# Patient Record
Sex: Female | Born: 1950 | Race: White | Hispanic: No | Marital: Single | Smoking: Never smoker
Health system: Southern US, Community
[De-identification: ages and names within clinical notes are randomized; demographics above are authoritative.]

## PROBLEM LIST (undated history)

## (undated) DIAGNOSIS — G4730 Sleep apnea, unspecified: Secondary | ICD-10-CM

## (undated) DIAGNOSIS — E119 Type 2 diabetes mellitus without complications: Secondary | ICD-10-CM

## (undated) DIAGNOSIS — H409 Unspecified glaucoma: Secondary | ICD-10-CM

## (undated) DIAGNOSIS — H269 Unspecified cataract: Secondary | ICD-10-CM

## (undated) DIAGNOSIS — I2699 Other pulmonary embolism without acute cor pulmonale: Secondary | ICD-10-CM

## (undated) DIAGNOSIS — F32A Depression, unspecified: Secondary | ICD-10-CM

## (undated) DIAGNOSIS — R269 Unspecified abnormalities of gait and mobility: Secondary | ICD-10-CM

## (undated) DIAGNOSIS — I10 Essential (primary) hypertension: Secondary | ICD-10-CM

## (undated) DIAGNOSIS — G119 Hereditary ataxia, unspecified: Secondary | ICD-10-CM

## (undated) DIAGNOSIS — M4800 Spinal stenosis, site unspecified: Secondary | ICD-10-CM

## (undated) DIAGNOSIS — E785 Hyperlipidemia, unspecified: Secondary | ICD-10-CM

## (undated) DIAGNOSIS — G709 Myoneural disorder, unspecified: Secondary | ICD-10-CM

## (undated) DIAGNOSIS — K219 Gastro-esophageal reflux disease without esophagitis: Secondary | ICD-10-CM

## (undated) DIAGNOSIS — J45909 Unspecified asthma, uncomplicated: Secondary | ICD-10-CM

## (undated) DIAGNOSIS — M1990 Unspecified osteoarthritis, unspecified site: Secondary | ICD-10-CM

## (undated) HISTORY — DX: Gastro-esophageal reflux disease without esophagitis: K21.9

## (undated) HISTORY — DX: Unspecified cataract: H26.9

## (undated) HISTORY — DX: Hyperlipidemia, unspecified: E78.5

## (undated) HISTORY — DX: Spinal stenosis, site unspecified: M48.00

## (undated) HISTORY — PX: TONSILLECTOMY: SUR1361

## (undated) HISTORY — PX: NASAL SINUS SURGERY: SHX719

## (undated) HISTORY — DX: Sleep apnea, unspecified: G47.30

## (undated) HISTORY — PX: HAND NEUROPLASTY: SHX1729

## (undated) HISTORY — DX: Hereditary ataxia, unspecified: G11.9

## (undated) HISTORY — PX: ELBOW SURGERY: SHX618

## (undated) HISTORY — PX: ABDOMINAL HYSTERECTOMY: SUR658

## (undated) HISTORY — PX: BILATERAL CARPAL TUNNEL RELEASE: SHX6508

## (undated) HISTORY — DX: Unspecified asthma, uncomplicated: J45.909

## (undated) HISTORY — DX: Unspecified glaucoma: H40.9

## (undated) HISTORY — DX: Unspecified osteoarthritis, unspecified site: M19.90

## (undated) HISTORY — PX: ADENOIDECTOMY: SUR15

## (undated) HISTORY — PX: ANKLE SURGERY: SHX546

## (undated) HISTORY — DX: Other pulmonary embolism without acute cor pulmonale: I26.99

## (undated) HISTORY — PX: COLON SURGERY: SHX602

## (undated) HISTORY — PX: ABDOMINAL HYSTERECTOMY: SHX81

## (undated) HISTORY — DX: Myoneural disorder, unspecified: G70.9

## (undated) HISTORY — PX: SMALL INTESTINE SURGERY: SHX150

## (undated) HISTORY — PX: EYE SURGERY: SHX253

## (undated) HISTORY — DX: Depression, unspecified: F32.A

---
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
## 2009-06-02 DIAGNOSIS — B354 Tinea corporis: Secondary | ICD-10-CM | POA: Insufficient documentation

## 2009-09-25 DIAGNOSIS — D72819 Decreased white blood cell count, unspecified: Secondary | ICD-10-CM | POA: Insufficient documentation

## 2011-01-28 DIAGNOSIS — L570 Actinic keratosis: Secondary | ICD-10-CM | POA: Insufficient documentation

## 2011-05-24 DIAGNOSIS — M19049 Primary osteoarthritis, unspecified hand: Secondary | ICD-10-CM | POA: Insufficient documentation

## 2012-02-25 DIAGNOSIS — L309 Dermatitis, unspecified: Secondary | ICD-10-CM | POA: Insufficient documentation

## 2012-03-15 HISTORY — PX: GASTRIC BYPASS: SHX52

## 2012-03-26 DIAGNOSIS — IMO0002 Reserved for concepts with insufficient information to code with codable children: Secondary | ICD-10-CM | POA: Insufficient documentation

## 2012-04-26 DIAGNOSIS — M5136 Other intervertebral disc degeneration, lumbar region: Secondary | ICD-10-CM | POA: Insufficient documentation

## 2012-04-26 DIAGNOSIS — M51369 Other intervertebral disc degeneration, lumbar region without mention of lumbar back pain or lower extremity pain: Secondary | ICD-10-CM | POA: Insufficient documentation

## 2012-04-26 DIAGNOSIS — M25579 Pain in unspecified ankle and joints of unspecified foot: Secondary | ICD-10-CM | POA: Insufficient documentation

## 2012-04-28 DIAGNOSIS — M48061 Spinal stenosis, lumbar region without neurogenic claudication: Secondary | ICD-10-CM | POA: Insufficient documentation

## 2012-05-11 DIAGNOSIS — F4323 Adjustment disorder with mixed anxiety and depressed mood: Secondary | ICD-10-CM | POA: Insufficient documentation

## 2012-05-16 DIAGNOSIS — M47812 Spondylosis without myelopathy or radiculopathy, cervical region: Secondary | ICD-10-CM | POA: Insufficient documentation

## 2012-10-30 DIAGNOSIS — L439 Lichen planus, unspecified: Secondary | ICD-10-CM | POA: Insufficient documentation

## 2012-10-30 DIAGNOSIS — L820 Inflamed seborrheic keratosis: Secondary | ICD-10-CM | POA: Insufficient documentation

## 2012-11-30 DIAGNOSIS — Z789 Other specified health status: Secondary | ICD-10-CM | POA: Insufficient documentation

## 2012-11-30 DIAGNOSIS — R69 Illness, unspecified: Secondary | ICD-10-CM | POA: Insufficient documentation

## 2013-01-09 DIAGNOSIS — G5600 Carpal tunnel syndrome, unspecified upper limb: Secondary | ICD-10-CM | POA: Insufficient documentation

## 2013-03-26 DIAGNOSIS — M797 Fibromyalgia: Secondary | ICD-10-CM | POA: Insufficient documentation

## 2013-03-26 DIAGNOSIS — J4530 Mild persistent asthma, uncomplicated: Secondary | ICD-10-CM | POA: Insufficient documentation

## 2013-04-11 DIAGNOSIS — M5459 Other low back pain: Secondary | ICD-10-CM | POA: Insufficient documentation

## 2013-12-17 DIAGNOSIS — M47816 Spondylosis without myelopathy or radiculopathy, lumbar region: Secondary | ICD-10-CM | POA: Insufficient documentation

## 2014-02-19 DIAGNOSIS — E785 Hyperlipidemia, unspecified: Secondary | ICD-10-CM | POA: Insufficient documentation

## 2014-04-01 DIAGNOSIS — G2582 Stiff-man syndrome: Secondary | ICD-10-CM | POA: Insufficient documentation

## 2014-04-01 DIAGNOSIS — Z8679 Personal history of other diseases of the circulatory system: Secondary | ICD-10-CM | POA: Insufficient documentation

## 2014-04-01 HISTORY — DX: Stiff-man syndrome: G25.82

## 2014-05-24 DIAGNOSIS — Z90710 Acquired absence of both cervix and uterus: Secondary | ICD-10-CM | POA: Insufficient documentation

## 2014-05-24 HISTORY — DX: Acquired absence of both cervix and uterus: Z90.710

## 2014-05-28 DIAGNOSIS — F980 Enuresis not due to a substance or known physiological condition: Secondary | ICD-10-CM | POA: Insufficient documentation

## 2014-05-28 DIAGNOSIS — N3941 Urge incontinence: Secondary | ICD-10-CM | POA: Insufficient documentation

## 2014-10-01 DIAGNOSIS — R799 Abnormal finding of blood chemistry, unspecified: Secondary | ICD-10-CM | POA: Insufficient documentation

## 2014-10-01 DIAGNOSIS — G4733 Obstructive sleep apnea (adult) (pediatric): Secondary | ICD-10-CM

## 2014-10-01 DIAGNOSIS — E349 Endocrine disorder, unspecified: Secondary | ICD-10-CM | POA: Insufficient documentation

## 2014-10-01 DIAGNOSIS — E109 Type 1 diabetes mellitus without complications: Secondary | ICD-10-CM | POA: Insufficient documentation

## 2014-10-01 HISTORY — DX: Obstructive sleep apnea (adult) (pediatric): G47.33

## 2014-10-29 DIAGNOSIS — E213 Hyperparathyroidism, unspecified: Secondary | ICD-10-CM | POA: Insufficient documentation

## 2014-12-23 DIAGNOSIS — Z9889 Other specified postprocedural states: Secondary | ICD-10-CM | POA: Insufficient documentation

## 2015-06-16 DIAGNOSIS — F32A Depression, unspecified: Secondary | ICD-10-CM | POA: Insufficient documentation

## 2016-03-18 DIAGNOSIS — G2582 Stiff-man syndrome: Secondary | ICD-10-CM | POA: Diagnosis not present

## 2016-03-24 DIAGNOSIS — G4733 Obstructive sleep apnea (adult) (pediatric): Secondary | ICD-10-CM | POA: Diagnosis not present

## 2016-03-24 DIAGNOSIS — G2582 Stiff-man syndrome: Secondary | ICD-10-CM | POA: Diagnosis not present

## 2016-04-01 DIAGNOSIS — G2582 Stiff-man syndrome: Secondary | ICD-10-CM | POA: Diagnosis not present

## 2016-04-06 DIAGNOSIS — G2582 Stiff-man syndrome: Secondary | ICD-10-CM | POA: Diagnosis not present

## 2016-04-08 DIAGNOSIS — G2582 Stiff-man syndrome: Secondary | ICD-10-CM | POA: Diagnosis not present

## 2016-04-13 DIAGNOSIS — G2582 Stiff-man syndrome: Secondary | ICD-10-CM | POA: Diagnosis not present

## 2016-04-15 DIAGNOSIS — L821 Other seborrheic keratosis: Secondary | ICD-10-CM | POA: Diagnosis not present

## 2016-04-15 DIAGNOSIS — G2582 Stiff-man syndrome: Secondary | ICD-10-CM | POA: Diagnosis not present

## 2016-04-17 DIAGNOSIS — T23121A Burn of first degree of single right finger (nail) except thumb, initial encounter: Secondary | ICD-10-CM | POA: Diagnosis not present

## 2016-04-20 DIAGNOSIS — G2582 Stiff-man syndrome: Secondary | ICD-10-CM | POA: Diagnosis not present

## 2016-04-29 DIAGNOSIS — L821 Other seborrheic keratosis: Secondary | ICD-10-CM | POA: Diagnosis not present

## 2016-04-29 DIAGNOSIS — G2582 Stiff-man syndrome: Secondary | ICD-10-CM | POA: Diagnosis not present

## 2016-05-04 DIAGNOSIS — Z1231 Encounter for screening mammogram for malignant neoplasm of breast: Secondary | ICD-10-CM | POA: Diagnosis not present

## 2016-05-11 ENCOUNTER — Ambulatory Visit: Admitting: Podiatrist

## 2016-05-11 DIAGNOSIS — M79671 Pain in right foot: Secondary | ICD-10-CM | POA: Diagnosis not present

## 2016-05-11 DIAGNOSIS — E1151 Type 2 diabetes mellitus with diabetic peripheral angiopathy without gangrene: Secondary | ICD-10-CM | POA: Diagnosis not present

## 2016-05-11 DIAGNOSIS — B351 Tinea unguium: Secondary | ICD-10-CM | POA: Diagnosis not present

## 2016-05-11 DIAGNOSIS — M79672 Pain in left foot: Secondary | ICD-10-CM | POA: Diagnosis not present

## 2016-05-13 DIAGNOSIS — L821 Other seborrheic keratosis: Secondary | ICD-10-CM | POA: Diagnosis not present

## 2016-05-13 DIAGNOSIS — E1065 Type 1 diabetes mellitus with hyperglycemia: Secondary | ICD-10-CM | POA: Diagnosis not present

## 2016-05-13 DIAGNOSIS — G2582 Stiff-man syndrome: Secondary | ICD-10-CM | POA: Diagnosis not present

## 2016-05-13 NOTE — Progress Notes (Signed)
Patient:   Paula Mcmillan            MRN: [id number]            FIN: [phone] E1                 Age:   66 years     Sex:  Female     DOB:  [birth year]     Associated Diagnoses:   Arteriosclerosis of the Extremities; NIDDM with Peripheral Circulatory Disorder; Insulin long-term use; Foot pain right; Foot pain left; Onychomycosis due to dermatophyte     Author:   Kathalene Frames          Visit Information          Date of Service: 05/11/2016 08:45 am  Performing Location: [facility]  Encounter#: [phone] E1     Visit type:  Diabetic foot care Established patient.      Accompanied by:  No one.      Source of history:  Self.      Referral source:  Self.      History limitation:  None.           Chief Complaint     Insulin-dependent diabetic with onychomycosis 1-10. 3-4 mm hypertrophy. Yellow/Brown pigmentation. Ingrowing and painful. Limit ambulation.The left hallux is developing a hyperkeratosis along the lateral groove due to insult from the nail plate.    Nucleated hyperkeratosis of the right heel.    Posterior hyperkeratosis of the left heel.     05/11/2016 9:39 AM EST    dfc            History of Present Illness          The patient presents with onychomycosis and     Nucleated hyperkeratoses of the plantar surface of the right heel and hyperkeratoses of the posterior surface of the left heel..       Stiff person syndrome.    Labile insulin-dependent diabetes.          Review of Systems     Constitutional:  Negative.      Eye:  Negative.      Ear/Nose/Mouth/Throat:  Negative.      Respiratory:  Negative.      Cardiovascular:  Negative.      Gastrointestinal:  Negative.      Genitourinary:  Negative.      Hematology/Lymphatics:  Negative.      Endocrine:  Negative.      Immunologic:  Negative.      Musculoskeletal:  Negative.      Integumentary:  Negative.      Neurologic:  Negative.      Psychiatric:  Negative.      All otther ROS were reviewed and are negative          Health  Status     Allergies:      Allergic Reactions (Selected)    Severity Not Documented    NSAIDs (No reactions were documented)    Nonallergic Reactions (Selected)    Severity Not Documented    Aspirin (Meningitis)     Medications:  (Selected)     Documented Medications    Documented    CellCept: ( 500 mg ) bid mL 0 Refill(s) Type: Maintenance    Flovent HFA 220 mcg/inh inhalation aerosol: 2 puff(s) inh bid 0 Refill(s) Type: Maintenance    Lantus: ( 36 unit(s) ) subcutaneous hs mL 0 Refill(s) Type: Maintenance    NovoLog: ( 14  unit(s) ) subcutaneous tidac mL 0 Refill(s) Type: Maintenance    Privigen: ( 300 mg/kg ) iv q2 wks 0 Refill(s) Type: Maintenance    ProAir HFA: inh qid 0 Refill(s) Type: Maintenance    Tylenol Extra Strength: ( 1000 mg ) po q6 hrs 0 Refill(s) Type: Maintenance    Valium: ( 5 mg ) bid tab(s) 0 Refill(s) Type: Maintenance    Vitamin D: po mL 0 Refill(s) Type: Maintenance    atorvastatin 40 mg oral tablet: 0.5 tab(s) ( 20 mg ) po hs 0 Refill(s) Type: Maintenance    clobetasol 0.05% topical gel: 1 app top bid 0 Refill(s) Type: Maintenance    oxybutynin 5 mg oral tablet: 1 tab(s) ( 5 mg ) PO bid PRN: for urinary discomfort # 30 tab(s) 0 Refill(s) Type: Maintenance    tizanidine 2 mg oral tablet: 2 tab(s) ( 4 mg ) po q8 hrs 0 Refill(s) Type: Maintenance     Problem list:      All Problems    Asthma / ICD-9-CM 493.90 / Confirmed    Back pain low back / ICD-9-CM 724.2 / Confirmed    Carpal Tunnel Syndrome / ICD-9-CM 354.0 / Confirmed    Diabetes / ICD-9-CM 250.00 / Confirmed    GERD (Gastroesophageal Reflux Disease) / ICD-9-CM 530.81 / Confirmed    High Cholesterol / ICD-9-CM 272.0 / Confirmed    Hypoglycemia / SNOMED CT [id number] / Confirmed    Neck pain / ICD-9-CM 723.1 / Confirmed    Obesity / ICD-9-CM 278.00 / Probable    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    HEADACHE (784.0):  Resolved.    Pulmonary embolism (99371696):  Resolved.    Comments:    10/24/2013 EDT 10:26 AM EDT - Barrie Folk    left     Family History:      Alzheimer's Disease    Father (Deceased)    Aneurysm    Grandmother (M) (Deceased)    Grandmother (P) (Deceased)    Grandfather (P) (Deceased)    Grandfather (M) (Deceased)    Alcohol user    Father (Deceased)    Cancer - unknown origin    Mother (Deceased)    Tobacco user    Father (Deceased)         Procedure history:      Tonsil and adenoid structure (789381017).    Gastric bypass operation (51025852).    Colonoscopy (778242353).    Hysterectomy (614431540).    Carpal tunnel release (086761950).  Ethmoid sinus (57846962).    Arthroscopy of ankle (952841324).    Central line insertion (401027253).     Social History:          Alcohol Assessment: Denies Alcohol Use              Never        Tobacco Assessment: Denies Tobacco Use              Never        Substance Abuse Assessment: Denies Substance Abuse              Dermatologic Exam     Bilateral lower extremeties: warm normal elasticity normal turgor no lesions normal hair growth xerosis.  Findings specific to bilateral toenails: hallux second toes third toes fourth toes fifth toes yellow thickened brittle painful with palpation  hypertrophic subungual debris.  Findings specific to the right heel: plantar aspect This Nucleated hyperkeratoses..  Findings specific to the left heel: Posterior hyperkeratoses..  Findings specific to the left toes: medial aspect hallux hyperkeratotic tissue.       Vascular Exam     Arterial pulses are absent for the left dorsalis pedis.  Arterial pulses are absent for the right dorsalis pedis.  Arterial pulses are absent for the left posterior tibial.  Arterial pulses are absent for the right posterior tibial.  Capillary refill is within normal limits.  Edema is not present.       Neurologic Exam     Both lower extremities have normal sensation.  vibration preception     intact bilaterally     Bilateral lower extremity reflexes: ankle absent and negative Babinski.  Nondemonstrable     Monofilament Exam:  intact bilaterally.      Additional information:  Joint position sense intact bilaterally. Bilateral spasm.      SIX MONTH CLASS FINDINGS UPDATE        Skin:         Texture   _  WNL        Color  _   WNL       Temp.    _    WNL              Tugor    _   WNL          Varicosities   _    none        Edema   _     none        Dermatoses  _    xerosis        Hair  _    WNL     Nails  _      1-10    Mycoses   _    yellow/Brown pigmentation      Thickness  _      3-4 mm hypertrophy        Lesions: (Description) _   Hyperkratosis                  Location: _  Plantar surface of the right heel and posterior surface of the left heel.                  Number: _ 2        CIRCULATION:        D.P. _  Fermoral _               Popliteal _                                                     P.T. _                 Left  _     absent         Right   _    absent_      Left _      Right _        Left _     Right _       Left   _    absent     Right   _     absent        NEUROLOGIC:        Cutanaous _        Joint Position _                  Babinski _         Left   _    WNL      Right   _    WNL          Left   _    WNL     Right    _     WNL                Left   _    negative        Right   _    negative        Ankle _              Vibratory _                        Monofliament _        Left    _   absent           Right  _     absent            Left   _       intact          Right  _      intact              Left  _    WNL          Right    _    WNL            MUSCLE:        Weakness.    _    5/5              Spasm.  _      bilateral                     Clonis.  _      none         PCP visit: 03/17/2010 08/18/2010 03/22/2011 08/2011 09/23/2011 12/2011 09/30/2012 10/10/2012 03/29/2013 11/23/2013 05/2014 05/24/2014 12/29/2014 12/2015        Bernadette Hoit M.D.        PQRS Data Collection Neuropathy  Diabetic Foot and Ankle Care Peripheral Neuropathy  Neurological Evaluation            Lower Extremity Neurological Exam        Motor     Sensory:  reflexes absent_ vibratory_WNL  proprioception WNL_ sharp/dullWNL _ monofilament detection_WNL        Performed  Yes _x No _    Not Performed  Yes _ No _       Reason Not Performed:                Musculoskeletal Exam     Bilaterally the lower extremities have normal strength and 5/5 no clonis bilateral spasm..  Bilaterally Bunion Bunionette.  Gait Assessment::  Apropulsive.           Impression and Plan     Diagnosis       Arteriosclerosis of the Extremities (ICD10-CM I70.209).       NIDDM with Peripheral Circulatory Disorder (ICD10-CM E11.51).       Insulin long-term use (ICD10-CM Z79.4).       Foot pain right (ICD10-CM M79.671).       Foot pain left (ICD10-CM M79.672).       Onychomycosis due to dermatophyte (ICD10-CM B35.1).       Course:  Well controlled.      Plan:  [place] will be followed every three months..           Procedure: Manual debridement Rotobur reduction.      Follow-up:  In  3  months.      Orders       Orders     In Office Patient Care:    PQRI #126 Diabetes Mellitus: Diabetic Fo (Order)    Requests (Return to Office):    Return to Clinic (Request)  (Order): Return in 3 months    Charges:    [phone] low extemity neur exam docum [employer]) (Order): Quantity: 1 NIDDM with Peripheral Circulatory Disorder    11720 debridement of nail(s) by any method(s); 1 to 5 [facility]) (Order): Quantity: 1 Onychomycosis due to dermatophyte  Foot pain right  Foot pain left  NIDDM with Peripheral Circulatory Disorder.           Signed and Authored by Elvina Mattes on 05/11/2016 09:59 AM EST

## 2016-05-13 NOTE — Initial Assessments (Signed)
Quick Intake Entered On:  08/19/2016 10:36 AM EDT      Performed On:  08/19/2016 10:36 AM EDT by Theodoro Kalata                         Summary     Chief Complaint :   dfc     Race :   Caucasian     Languages :   English     Ethnicity :   Not Hispanic or Latino     [place]  Nadene - 08/19/2016 10:36 AM EDT

## 2016-05-26 DIAGNOSIS — E1065 Type 1 diabetes mellitus with hyperglycemia: Secondary | ICD-10-CM | POA: Diagnosis not present

## 2016-05-26 DIAGNOSIS — E785 Hyperlipidemia, unspecified: Secondary | ICD-10-CM | POA: Diagnosis not present

## 2016-05-27 DIAGNOSIS — G2582 Stiff-man syndrome: Secondary | ICD-10-CM | POA: Diagnosis not present

## 2016-06-10 DIAGNOSIS — G2582 Stiff-man syndrome: Secondary | ICD-10-CM | POA: Diagnosis not present

## 2016-06-14 DIAGNOSIS — I10 Essential (primary) hypertension: Secondary | ICD-10-CM | POA: Diagnosis not present

## 2016-06-14 DIAGNOSIS — Z79899 Other long term (current) drug therapy: Secondary | ICD-10-CM | POA: Diagnosis not present

## 2016-06-14 DIAGNOSIS — R42 Dizziness and giddiness: Secondary | ICD-10-CM | POA: Diagnosis not present

## 2016-06-14 DIAGNOSIS — E10649 Type 1 diabetes mellitus with hypoglycemia without coma: Secondary | ICD-10-CM | POA: Diagnosis not present

## 2016-06-14 DIAGNOSIS — E162 Hypoglycemia, unspecified: Secondary | ICD-10-CM | POA: Diagnosis not present

## 2016-06-14 DIAGNOSIS — Z794 Long term (current) use of insulin: Secondary | ICD-10-CM | POA: Diagnosis not present

## 2016-06-16 DIAGNOSIS — E10649 Type 1 diabetes mellitus with hypoglycemia without coma: Secondary | ICD-10-CM | POA: Diagnosis not present

## 2016-06-16 DIAGNOSIS — E162 Hypoglycemia, unspecified: Secondary | ICD-10-CM | POA: Diagnosis not present

## 2016-06-19 DIAGNOSIS — M4800 Spinal stenosis, site unspecified: Secondary | ICD-10-CM | POA: Diagnosis not present

## 2016-06-19 DIAGNOSIS — F339 Major depressive disorder, recurrent, unspecified: Secondary | ICD-10-CM | POA: Diagnosis not present

## 2016-06-19 DIAGNOSIS — F4323 Adjustment disorder with mixed anxiety and depressed mood: Secondary | ICD-10-CM | POA: Diagnosis not present

## 2016-06-19 DIAGNOSIS — Z86711 Personal history of pulmonary embolism: Secondary | ICD-10-CM | POA: Diagnosis not present

## 2016-06-19 DIAGNOSIS — G4733 Obstructive sleep apnea (adult) (pediatric): Secondary | ICD-10-CM | POA: Diagnosis not present

## 2016-06-19 DIAGNOSIS — Z8049 Family history of malignant neoplasm of other genital organs: Secondary | ICD-10-CM | POA: Diagnosis not present

## 2016-06-19 DIAGNOSIS — G4730 Sleep apnea, unspecified: Secondary | ICD-10-CM | POA: Diagnosis present

## 2016-06-19 DIAGNOSIS — F329 Major depressive disorder, single episode, unspecified: Secondary | ICD-10-CM | POA: Diagnosis present

## 2016-06-19 DIAGNOSIS — E213 Hyperparathyroidism, unspecified: Secondary | ICD-10-CM | POA: Diagnosis not present

## 2016-06-19 DIAGNOSIS — N3941 Urge incontinence: Secondary | ICD-10-CM | POA: Diagnosis not present

## 2016-06-19 DIAGNOSIS — Z9989 Dependence on other enabling machines and devices: Secondary | ICD-10-CM | POA: Diagnosis not present

## 2016-06-19 DIAGNOSIS — R9431 Abnormal electrocardiogram [ECG] [EKG]: Secondary | ICD-10-CM | POA: Diagnosis not present

## 2016-06-19 DIAGNOSIS — R4182 Altered mental status, unspecified: Secondary | ICD-10-CM | POA: Diagnosis not present

## 2016-06-19 DIAGNOSIS — M5136 Other intervertebral disc degeneration, lumbar region: Secondary | ICD-10-CM | POA: Diagnosis not present

## 2016-06-19 DIAGNOSIS — F419 Anxiety disorder, unspecified: Secondary | ICD-10-CM | POA: Diagnosis not present

## 2016-06-19 DIAGNOSIS — Z8249 Family history of ischemic heart disease and other diseases of the circulatory system: Secondary | ICD-10-CM | POA: Diagnosis not present

## 2016-06-19 DIAGNOSIS — Z794 Long term (current) use of insulin: Secondary | ICD-10-CM | POA: Diagnosis not present

## 2016-06-19 DIAGNOSIS — R260 Ataxic gait: Secondary | ICD-10-CM | POA: Diagnosis not present

## 2016-06-19 DIAGNOSIS — G119 Hereditary ataxia, unspecified: Secondary | ICD-10-CM | POA: Diagnosis not present

## 2016-06-19 DIAGNOSIS — E784 Other hyperlipidemia: Secondary | ICD-10-CM | POA: Diagnosis not present

## 2016-06-19 DIAGNOSIS — J45909 Unspecified asthma, uncomplicated: Secondary | ICD-10-CM | POA: Diagnosis present

## 2016-06-19 DIAGNOSIS — M6281 Muscle weakness (generalized): Secondary | ICD-10-CM | POA: Diagnosis not present

## 2016-06-19 DIAGNOSIS — G2582 Stiff-man syndrome: Secondary | ICD-10-CM | POA: Diagnosis present

## 2016-06-19 DIAGNOSIS — R55 Syncope and collapse: Secondary | ICD-10-CM | POA: Diagnosis not present

## 2016-06-19 DIAGNOSIS — E10649 Type 1 diabetes mellitus with hypoglycemia without coma: Secondary | ICD-10-CM | POA: Diagnosis present

## 2016-06-19 DIAGNOSIS — R410 Disorientation, unspecified: Secondary | ICD-10-CM | POA: Diagnosis not present

## 2016-06-19 DIAGNOSIS — Z9181 History of falling: Secondary | ICD-10-CM | POA: Diagnosis not present

## 2016-06-19 DIAGNOSIS — Z823 Family history of stroke: Secondary | ICD-10-CM | POA: Diagnosis not present

## 2016-06-19 DIAGNOSIS — R001 Bradycardia, unspecified: Secondary | ICD-10-CM | POA: Diagnosis present

## 2016-06-19 DIAGNOSIS — N3281 Overactive bladder: Secondary | ICD-10-CM | POA: Diagnosis present

## 2016-06-19 DIAGNOSIS — E210 Primary hyperparathyroidism: Secondary | ICD-10-CM | POA: Diagnosis present

## 2016-06-19 DIAGNOSIS — E109 Type 1 diabetes mellitus without complications: Secondary | ICD-10-CM | POA: Diagnosis not present

## 2016-06-19 DIAGNOSIS — Z83511 Family history of glaucoma: Secondary | ICD-10-CM | POA: Diagnosis not present

## 2016-06-19 DIAGNOSIS — E7800 Pure hypercholesterolemia, unspecified: Secondary | ICD-10-CM | POA: Diagnosis present

## 2016-06-19 DIAGNOSIS — Z79899 Other long term (current) drug therapy: Secondary | ICD-10-CM | POA: Diagnosis not present

## 2016-06-19 DIAGNOSIS — Z833 Family history of diabetes mellitus: Secondary | ICD-10-CM | POA: Diagnosis not present

## 2016-06-20 DIAGNOSIS — E10649 Type 1 diabetes mellitus with hypoglycemia without coma: Secondary | ICD-10-CM | POA: Insufficient documentation

## 2016-06-22 DIAGNOSIS — G4733 Obstructive sleep apnea (adult) (pediatric): Secondary | ICD-10-CM | POA: Diagnosis not present

## 2016-06-22 DIAGNOSIS — E119 Type 2 diabetes mellitus without complications: Secondary | ICD-10-CM | POA: Diagnosis not present

## 2016-06-22 DIAGNOSIS — M5136 Other intervertebral disc degeneration, lumbar region: Secondary | ICD-10-CM | POA: Diagnosis not present

## 2016-06-22 DIAGNOSIS — M6281 Muscle weakness (generalized): Secondary | ICD-10-CM | POA: Diagnosis not present

## 2016-06-22 DIAGNOSIS — E109 Type 1 diabetes mellitus without complications: Secondary | ICD-10-CM | POA: Diagnosis not present

## 2016-06-22 DIAGNOSIS — Z794 Long term (current) use of insulin: Secondary | ICD-10-CM | POA: Diagnosis not present

## 2016-06-22 DIAGNOSIS — R55 Syncope and collapse: Secondary | ICD-10-CM | POA: Diagnosis not present

## 2016-06-22 DIAGNOSIS — Z9181 History of falling: Secondary | ICD-10-CM | POA: Diagnosis not present

## 2016-06-22 DIAGNOSIS — E784 Other hyperlipidemia: Secondary | ICD-10-CM | POA: Diagnosis not present

## 2016-06-22 DIAGNOSIS — G119 Hereditary ataxia, unspecified: Secondary | ICD-10-CM | POA: Diagnosis not present

## 2016-06-22 DIAGNOSIS — F329 Major depressive disorder, single episode, unspecified: Secondary | ICD-10-CM | POA: Diagnosis not present

## 2016-06-22 DIAGNOSIS — J45909 Unspecified asthma, uncomplicated: Secondary | ICD-10-CM | POA: Diagnosis not present

## 2016-06-22 DIAGNOSIS — M4800 Spinal stenosis, site unspecified: Secondary | ICD-10-CM | POA: Diagnosis not present

## 2016-06-22 DIAGNOSIS — G2582 Stiff-man syndrome: Secondary | ICD-10-CM | POA: Diagnosis not present

## 2016-06-22 DIAGNOSIS — N3941 Urge incontinence: Secondary | ICD-10-CM | POA: Diagnosis not present

## 2016-06-22 DIAGNOSIS — N3281 Overactive bladder: Secondary | ICD-10-CM | POA: Diagnosis not present

## 2016-06-22 DIAGNOSIS — F339 Major depressive disorder, recurrent, unspecified: Secondary | ICD-10-CM | POA: Diagnosis not present

## 2016-06-22 DIAGNOSIS — E7800 Pure hypercholesterolemia, unspecified: Secondary | ICD-10-CM | POA: Diagnosis not present

## 2016-06-22 DIAGNOSIS — E10649 Type 1 diabetes mellitus with hypoglycemia without coma: Secondary | ICD-10-CM | POA: Diagnosis not present

## 2016-06-22 DIAGNOSIS — R260 Ataxic gait: Secondary | ICD-10-CM | POA: Diagnosis not present

## 2016-06-22 DIAGNOSIS — E213 Hyperparathyroidism, unspecified: Secondary | ICD-10-CM | POA: Diagnosis not present

## 2016-06-22 DIAGNOSIS — F4323 Adjustment disorder with mixed anxiety and depressed mood: Secondary | ICD-10-CM | POA: Diagnosis not present

## 2016-06-22 DIAGNOSIS — Z9989 Dependence on other enabling machines and devices: Secondary | ICD-10-CM | POA: Diagnosis not present

## 2016-06-22 DIAGNOSIS — F419 Anxiety disorder, unspecified: Secondary | ICD-10-CM | POA: Diagnosis not present

## 2016-06-23 DIAGNOSIS — R55 Syncope and collapse: Secondary | ICD-10-CM | POA: Diagnosis not present

## 2016-06-23 DIAGNOSIS — E119 Type 2 diabetes mellitus without complications: Secondary | ICD-10-CM | POA: Diagnosis not present

## 2016-06-23 DIAGNOSIS — Z794 Long term (current) use of insulin: Secondary | ICD-10-CM | POA: Diagnosis not present

## 2016-06-23 DIAGNOSIS — F329 Major depressive disorder, single episode, unspecified: Secondary | ICD-10-CM | POA: Diagnosis not present

## 2016-06-25 DIAGNOSIS — R55 Syncope and collapse: Secondary | ICD-10-CM | POA: Diagnosis not present

## 2016-06-25 DIAGNOSIS — Z794 Long term (current) use of insulin: Secondary | ICD-10-CM | POA: Diagnosis not present

## 2016-06-25 DIAGNOSIS — E119 Type 2 diabetes mellitus without complications: Secondary | ICD-10-CM | POA: Diagnosis not present

## 2016-06-25 DIAGNOSIS — G2582 Stiff-man syndrome: Secondary | ICD-10-CM | POA: Diagnosis not present

## 2016-06-27 DIAGNOSIS — E119 Type 2 diabetes mellitus without complications: Secondary | ICD-10-CM | POA: Diagnosis not present

## 2016-06-27 DIAGNOSIS — Z794 Long term (current) use of insulin: Secondary | ICD-10-CM | POA: Diagnosis not present

## 2016-06-27 DIAGNOSIS — G2582 Stiff-man syndrome: Secondary | ICD-10-CM | POA: Diagnosis not present

## 2016-06-27 DIAGNOSIS — R55 Syncope and collapse: Secondary | ICD-10-CM | POA: Diagnosis not present

## 2016-06-28 DIAGNOSIS — E119 Type 2 diabetes mellitus without complications: Secondary | ICD-10-CM | POA: Diagnosis not present

## 2016-06-28 DIAGNOSIS — F329 Major depressive disorder, single episode, unspecified: Secondary | ICD-10-CM | POA: Diagnosis not present

## 2016-06-28 DIAGNOSIS — Z794 Long term (current) use of insulin: Secondary | ICD-10-CM | POA: Diagnosis not present

## 2016-06-28 DIAGNOSIS — R55 Syncope and collapse: Secondary | ICD-10-CM | POA: Diagnosis not present

## 2016-06-29 DIAGNOSIS — R55 Syncope and collapse: Secondary | ICD-10-CM | POA: Diagnosis not present

## 2016-06-29 DIAGNOSIS — E109 Type 1 diabetes mellitus without complications: Secondary | ICD-10-CM | POA: Diagnosis not present

## 2016-07-02 DIAGNOSIS — R55 Syncope and collapse: Secondary | ICD-10-CM | POA: Diagnosis not present

## 2016-07-02 DIAGNOSIS — E119 Type 2 diabetes mellitus without complications: Secondary | ICD-10-CM | POA: Diagnosis not present

## 2016-07-02 DIAGNOSIS — Z794 Long term (current) use of insulin: Secondary | ICD-10-CM | POA: Diagnosis not present

## 2016-07-02 DIAGNOSIS — F329 Major depressive disorder, single episode, unspecified: Secondary | ICD-10-CM | POA: Diagnosis not present

## 2016-07-04 DIAGNOSIS — E119 Type 2 diabetes mellitus without complications: Secondary | ICD-10-CM | POA: Diagnosis not present

## 2016-07-04 DIAGNOSIS — Z794 Long term (current) use of insulin: Secondary | ICD-10-CM | POA: Diagnosis not present

## 2016-07-04 DIAGNOSIS — R55 Syncope and collapse: Secondary | ICD-10-CM | POA: Diagnosis not present

## 2016-07-04 DIAGNOSIS — F329 Major depressive disorder, single episode, unspecified: Secondary | ICD-10-CM | POA: Diagnosis not present

## 2016-07-05 DIAGNOSIS — R55 Syncope and collapse: Secondary | ICD-10-CM | POA: Diagnosis not present

## 2016-07-05 DIAGNOSIS — E119 Type 2 diabetes mellitus without complications: Secondary | ICD-10-CM | POA: Diagnosis not present

## 2016-07-05 DIAGNOSIS — Z794 Long term (current) use of insulin: Secondary | ICD-10-CM | POA: Diagnosis not present

## 2016-07-05 DIAGNOSIS — F329 Major depressive disorder, single episode, unspecified: Secondary | ICD-10-CM | POA: Diagnosis not present

## 2016-07-07 DIAGNOSIS — R55 Syncope and collapse: Secondary | ICD-10-CM | POA: Diagnosis not present

## 2016-07-07 DIAGNOSIS — Z794 Long term (current) use of insulin: Secondary | ICD-10-CM | POA: Diagnosis not present

## 2016-07-07 DIAGNOSIS — E119 Type 2 diabetes mellitus without complications: Secondary | ICD-10-CM | POA: Diagnosis not present

## 2016-07-07 DIAGNOSIS — F329 Major depressive disorder, single episode, unspecified: Secondary | ICD-10-CM | POA: Diagnosis not present

## 2016-07-09 DIAGNOSIS — G119 Hereditary ataxia, unspecified: Secondary | ICD-10-CM | POA: Diagnosis not present

## 2016-07-09 DIAGNOSIS — M48061 Spinal stenosis, lumbar region without neurogenic claudication: Secondary | ICD-10-CM | POA: Diagnosis not present

## 2016-07-09 DIAGNOSIS — E109 Type 1 diabetes mellitus without complications: Secondary | ICD-10-CM | POA: Diagnosis not present

## 2016-07-09 DIAGNOSIS — I10 Essential (primary) hypertension: Secondary | ICD-10-CM | POA: Diagnosis not present

## 2016-07-09 DIAGNOSIS — G2582 Stiff-man syndrome: Secondary | ICD-10-CM | POA: Diagnosis not present

## 2016-07-09 DIAGNOSIS — F329 Major depressive disorder, single episode, unspecified: Secondary | ICD-10-CM | POA: Diagnosis not present

## 2016-07-09 DIAGNOSIS — Z9181 History of falling: Secondary | ICD-10-CM | POA: Diagnosis not present

## 2016-07-09 DIAGNOSIS — J449 Chronic obstructive pulmonary disease, unspecified: Secondary | ICD-10-CM | POA: Diagnosis not present

## 2016-07-13 DIAGNOSIS — E109 Type 1 diabetes mellitus without complications: Secondary | ICD-10-CM | POA: Diagnosis not present

## 2016-07-13 DIAGNOSIS — I10 Essential (primary) hypertension: Secondary | ICD-10-CM | POA: Diagnosis not present

## 2016-07-13 DIAGNOSIS — L821 Other seborrheic keratosis: Secondary | ICD-10-CM | POA: Diagnosis not present

## 2016-07-13 DIAGNOSIS — G119 Hereditary ataxia, unspecified: Secondary | ICD-10-CM | POA: Diagnosis not present

## 2016-07-13 DIAGNOSIS — M48061 Spinal stenosis, lumbar region without neurogenic claudication: Secondary | ICD-10-CM | POA: Diagnosis not present

## 2016-07-13 DIAGNOSIS — G2582 Stiff-man syndrome: Secondary | ICD-10-CM | POA: Diagnosis not present

## 2016-07-13 DIAGNOSIS — J449 Chronic obstructive pulmonary disease, unspecified: Secondary | ICD-10-CM | POA: Diagnosis not present

## 2016-07-14 DIAGNOSIS — E1065 Type 1 diabetes mellitus with hyperglycemia: Secondary | ICD-10-CM | POA: Diagnosis not present

## 2016-07-14 DIAGNOSIS — J449 Chronic obstructive pulmonary disease, unspecified: Secondary | ICD-10-CM | POA: Diagnosis not present

## 2016-07-14 DIAGNOSIS — M48061 Spinal stenosis, lumbar region without neurogenic claudication: Secondary | ICD-10-CM | POA: Diagnosis not present

## 2016-07-14 DIAGNOSIS — G2582 Stiff-man syndrome: Secondary | ICD-10-CM | POA: Diagnosis not present

## 2016-07-14 DIAGNOSIS — G119 Hereditary ataxia, unspecified: Secondary | ICD-10-CM | POA: Diagnosis not present

## 2016-07-14 DIAGNOSIS — I10 Essential (primary) hypertension: Secondary | ICD-10-CM | POA: Diagnosis not present

## 2016-07-14 DIAGNOSIS — E109 Type 1 diabetes mellitus without complications: Secondary | ICD-10-CM | POA: Diagnosis not present

## 2016-07-16 DIAGNOSIS — M48061 Spinal stenosis, lumbar region without neurogenic claudication: Secondary | ICD-10-CM | POA: Diagnosis not present

## 2016-07-16 DIAGNOSIS — G2582 Stiff-man syndrome: Secondary | ICD-10-CM | POA: Diagnosis not present

## 2016-07-16 DIAGNOSIS — E109 Type 1 diabetes mellitus without complications: Secondary | ICD-10-CM | POA: Diagnosis not present

## 2016-07-16 DIAGNOSIS — I10 Essential (primary) hypertension: Secondary | ICD-10-CM | POA: Diagnosis not present

## 2016-07-16 DIAGNOSIS — J449 Chronic obstructive pulmonary disease, unspecified: Secondary | ICD-10-CM | POA: Diagnosis not present

## 2016-07-16 DIAGNOSIS — G119 Hereditary ataxia, unspecified: Secondary | ICD-10-CM | POA: Diagnosis not present

## 2016-07-19 DIAGNOSIS — E109 Type 1 diabetes mellitus without complications: Secondary | ICD-10-CM | POA: Diagnosis not present

## 2016-07-19 DIAGNOSIS — G119 Hereditary ataxia, unspecified: Secondary | ICD-10-CM | POA: Diagnosis not present

## 2016-07-19 DIAGNOSIS — I10 Essential (primary) hypertension: Secondary | ICD-10-CM | POA: Diagnosis not present

## 2016-07-19 DIAGNOSIS — J449 Chronic obstructive pulmonary disease, unspecified: Secondary | ICD-10-CM | POA: Diagnosis not present

## 2016-07-19 DIAGNOSIS — G2582 Stiff-man syndrome: Secondary | ICD-10-CM | POA: Diagnosis not present

## 2016-07-19 DIAGNOSIS — M48061 Spinal stenosis, lumbar region without neurogenic claudication: Secondary | ICD-10-CM | POA: Diagnosis not present

## 2016-07-20 DIAGNOSIS — E109 Type 1 diabetes mellitus without complications: Secondary | ICD-10-CM | POA: Diagnosis not present

## 2016-07-22 DIAGNOSIS — M48061 Spinal stenosis, lumbar region without neurogenic claudication: Secondary | ICD-10-CM | POA: Diagnosis not present

## 2016-07-22 DIAGNOSIS — E109 Type 1 diabetes mellitus without complications: Secondary | ICD-10-CM | POA: Diagnosis not present

## 2016-07-22 DIAGNOSIS — J449 Chronic obstructive pulmonary disease, unspecified: Secondary | ICD-10-CM | POA: Diagnosis not present

## 2016-07-22 DIAGNOSIS — G119 Hereditary ataxia, unspecified: Secondary | ICD-10-CM | POA: Diagnosis not present

## 2016-07-22 DIAGNOSIS — G2582 Stiff-man syndrome: Secondary | ICD-10-CM | POA: Diagnosis not present

## 2016-07-22 DIAGNOSIS — I10 Essential (primary) hypertension: Secondary | ICD-10-CM | POA: Diagnosis not present

## 2016-07-22 DIAGNOSIS — E10649 Type 1 diabetes mellitus with hypoglycemia without coma: Secondary | ICD-10-CM | POA: Diagnosis not present

## 2016-07-22 DIAGNOSIS — Z09 Encounter for follow-up examination after completed treatment for conditions other than malignant neoplasm: Secondary | ICD-10-CM | POA: Diagnosis not present

## 2016-07-22 DIAGNOSIS — Z23 Encounter for immunization: Secondary | ICD-10-CM | POA: Diagnosis not present

## 2016-07-23 DIAGNOSIS — G119 Hereditary ataxia, unspecified: Secondary | ICD-10-CM | POA: Diagnosis not present

## 2016-07-23 DIAGNOSIS — I10 Essential (primary) hypertension: Secondary | ICD-10-CM | POA: Diagnosis not present

## 2016-07-23 DIAGNOSIS — E109 Type 1 diabetes mellitus without complications: Secondary | ICD-10-CM | POA: Diagnosis not present

## 2016-07-23 DIAGNOSIS — M48061 Spinal stenosis, lumbar region without neurogenic claudication: Secondary | ICD-10-CM | POA: Diagnosis not present

## 2016-07-23 DIAGNOSIS — G2582 Stiff-man syndrome: Secondary | ICD-10-CM | POA: Diagnosis not present

## 2016-07-23 DIAGNOSIS — J449 Chronic obstructive pulmonary disease, unspecified: Secondary | ICD-10-CM | POA: Diagnosis not present

## 2016-07-26 DIAGNOSIS — I10 Essential (primary) hypertension: Secondary | ICD-10-CM | POA: Diagnosis not present

## 2016-07-26 DIAGNOSIS — E109 Type 1 diabetes mellitus without complications: Secondary | ICD-10-CM | POA: Diagnosis not present

## 2016-07-26 DIAGNOSIS — G119 Hereditary ataxia, unspecified: Secondary | ICD-10-CM | POA: Diagnosis not present

## 2016-07-26 DIAGNOSIS — J449 Chronic obstructive pulmonary disease, unspecified: Secondary | ICD-10-CM | POA: Diagnosis not present

## 2016-07-26 DIAGNOSIS — M48061 Spinal stenosis, lumbar region without neurogenic claudication: Secondary | ICD-10-CM | POA: Diagnosis not present

## 2016-07-26 DIAGNOSIS — G2582 Stiff-man syndrome: Secondary | ICD-10-CM | POA: Diagnosis not present

## 2016-07-27 DIAGNOSIS — G2582 Stiff-man syndrome: Secondary | ICD-10-CM | POA: Diagnosis not present

## 2016-07-28 DIAGNOSIS — J449 Chronic obstructive pulmonary disease, unspecified: Secondary | ICD-10-CM | POA: Diagnosis not present

## 2016-07-28 DIAGNOSIS — M48061 Spinal stenosis, lumbar region without neurogenic claudication: Secondary | ICD-10-CM | POA: Diagnosis not present

## 2016-07-28 DIAGNOSIS — I10 Essential (primary) hypertension: Secondary | ICD-10-CM | POA: Diagnosis not present

## 2016-07-28 DIAGNOSIS — G2582 Stiff-man syndrome: Secondary | ICD-10-CM | POA: Diagnosis not present

## 2016-07-28 DIAGNOSIS — G119 Hereditary ataxia, unspecified: Secondary | ICD-10-CM | POA: Diagnosis not present

## 2016-07-28 DIAGNOSIS — E109 Type 1 diabetes mellitus without complications: Secondary | ICD-10-CM | POA: Diagnosis not present

## 2016-07-29 DIAGNOSIS — J449 Chronic obstructive pulmonary disease, unspecified: Secondary | ICD-10-CM | POA: Diagnosis not present

## 2016-07-29 DIAGNOSIS — E109 Type 1 diabetes mellitus without complications: Secondary | ICD-10-CM | POA: Diagnosis not present

## 2016-07-29 DIAGNOSIS — G119 Hereditary ataxia, unspecified: Secondary | ICD-10-CM | POA: Diagnosis not present

## 2016-07-29 DIAGNOSIS — G2582 Stiff-man syndrome: Secondary | ICD-10-CM | POA: Diagnosis not present

## 2016-07-29 DIAGNOSIS — M48061 Spinal stenosis, lumbar region without neurogenic claudication: Secondary | ICD-10-CM | POA: Diagnosis not present

## 2016-07-29 DIAGNOSIS — I10 Essential (primary) hypertension: Secondary | ICD-10-CM | POA: Diagnosis not present

## 2016-08-03 DIAGNOSIS — G2582 Stiff-man syndrome: Secondary | ICD-10-CM | POA: Diagnosis not present

## 2016-08-03 DIAGNOSIS — J449 Chronic obstructive pulmonary disease, unspecified: Secondary | ICD-10-CM | POA: Diagnosis not present

## 2016-08-03 DIAGNOSIS — F329 Major depressive disorder, single episode, unspecified: Secondary | ICD-10-CM | POA: Diagnosis not present

## 2016-08-03 DIAGNOSIS — M48061 Spinal stenosis, lumbar region without neurogenic claudication: Secondary | ICD-10-CM | POA: Diagnosis not present

## 2016-08-03 DIAGNOSIS — Z9181 History of falling: Secondary | ICD-10-CM | POA: Diagnosis not present

## 2016-08-03 DIAGNOSIS — E109 Type 1 diabetes mellitus without complications: Secondary | ICD-10-CM | POA: Diagnosis not present

## 2016-08-03 DIAGNOSIS — G119 Hereditary ataxia, unspecified: Secondary | ICD-10-CM | POA: Diagnosis not present

## 2016-08-03 DIAGNOSIS — I10 Essential (primary) hypertension: Secondary | ICD-10-CM | POA: Diagnosis not present

## 2016-08-06 DIAGNOSIS — M48061 Spinal stenosis, lumbar region without neurogenic claudication: Secondary | ICD-10-CM | POA: Diagnosis not present

## 2016-08-06 DIAGNOSIS — G2582 Stiff-man syndrome: Secondary | ICD-10-CM | POA: Diagnosis not present

## 2016-08-06 DIAGNOSIS — E109 Type 1 diabetes mellitus without complications: Secondary | ICD-10-CM | POA: Diagnosis not present

## 2016-08-06 DIAGNOSIS — G119 Hereditary ataxia, unspecified: Secondary | ICD-10-CM | POA: Diagnosis not present

## 2016-08-06 DIAGNOSIS — J449 Chronic obstructive pulmonary disease, unspecified: Secondary | ICD-10-CM | POA: Diagnosis not present

## 2016-08-06 DIAGNOSIS — I10 Essential (primary) hypertension: Secondary | ICD-10-CM | POA: Diagnosis not present

## 2016-08-10 DIAGNOSIS — J449 Chronic obstructive pulmonary disease, unspecified: Secondary | ICD-10-CM | POA: Diagnosis not present

## 2016-08-10 DIAGNOSIS — G119 Hereditary ataxia, unspecified: Secondary | ICD-10-CM | POA: Diagnosis not present

## 2016-08-10 DIAGNOSIS — I10 Essential (primary) hypertension: Secondary | ICD-10-CM | POA: Diagnosis not present

## 2016-08-10 DIAGNOSIS — E109 Type 1 diabetes mellitus without complications: Secondary | ICD-10-CM | POA: Diagnosis not present

## 2016-08-10 DIAGNOSIS — M48061 Spinal stenosis, lumbar region without neurogenic claudication: Secondary | ICD-10-CM | POA: Diagnosis not present

## 2016-08-10 DIAGNOSIS — G2582 Stiff-man syndrome: Secondary | ICD-10-CM | POA: Diagnosis not present

## 2016-08-12 DIAGNOSIS — G2582 Stiff-man syndrome: Secondary | ICD-10-CM | POA: Diagnosis not present

## 2016-08-13 DIAGNOSIS — J449 Chronic obstructive pulmonary disease, unspecified: Secondary | ICD-10-CM | POA: Diagnosis not present

## 2016-08-13 DIAGNOSIS — G2582 Stiff-man syndrome: Secondary | ICD-10-CM | POA: Diagnosis not present

## 2016-08-13 DIAGNOSIS — E109 Type 1 diabetes mellitus without complications: Secondary | ICD-10-CM | POA: Diagnosis not present

## 2016-08-13 DIAGNOSIS — I10 Essential (primary) hypertension: Secondary | ICD-10-CM | POA: Diagnosis not present

## 2016-08-13 DIAGNOSIS — G119 Hereditary ataxia, unspecified: Secondary | ICD-10-CM | POA: Diagnosis not present

## 2016-08-13 DIAGNOSIS — M48061 Spinal stenosis, lumbar region without neurogenic claudication: Secondary | ICD-10-CM | POA: Diagnosis not present

## 2016-08-17 DIAGNOSIS — J4530 Mild persistent asthma, uncomplicated: Secondary | ICD-10-CM | POA: Diagnosis not present

## 2016-08-17 DIAGNOSIS — G4733 Obstructive sleep apnea (adult) (pediatric): Secondary | ICD-10-CM | POA: Diagnosis not present

## 2016-08-17 DIAGNOSIS — Z9989 Dependence on other enabling machines and devices: Secondary | ICD-10-CM | POA: Diagnosis not present

## 2016-08-19 ENCOUNTER — Ambulatory Visit: Admitting: Podiatrist

## 2016-08-19 DIAGNOSIS — M79672 Pain in left foot: Secondary | ICD-10-CM | POA: Diagnosis not present

## 2016-08-19 DIAGNOSIS — M79671 Pain in right foot: Secondary | ICD-10-CM | POA: Diagnosis not present

## 2016-08-19 DIAGNOSIS — E1151 Type 2 diabetes mellitus with diabetic peripheral angiopathy without gangrene: Secondary | ICD-10-CM | POA: Diagnosis not present

## 2016-08-19 DIAGNOSIS — B351 Tinea unguium: Secondary | ICD-10-CM | POA: Diagnosis not present

## 2016-08-21 NOTE — Progress Notes (Signed)
Patient:   Paula Mcmillan            MRN: [id number]            FIN: [id number]                 Age:   66 years     Sex:  Female     DOB:  1951/02/06     Associated Diagnoses:   Arteriosclerosis of the Extremities; NIDDM with Peripheral Circulatory Disorder; Insulin long-term use; Foot pain right; Foot pain left; Onychomycosis due to dermatophyte     Author:   Kathalene Frames          Visit Information          Date of Service: 08/19/2016 09:35 am  Performing Location: [facility]  Encounter#: [id number]     Visit type:  Diabetic foot care Established patient.      Accompanied by:  No one.      Source of history:  Self.      Referral source:  Self.      History limitation:  None.           Chief Complaint     Insulin-dependent diabetic with onychomycosis 1-10. 3-4 mm hypertrophy. Yellow/Brown pigmentation. Ingrowing and painful. Limit ambulation.The left hallux is developing a hyperkeratosis along the lateral groove due to insult from the nail plate.    Nucleated hyperkeratosis of the right heel.    Posterior hyperkeratosis of the left heel.     08/19/2016 10:36 AM EDT    dfc            History of Present Illness          The patient presents with onychomycosis and     Nucleated hyperkeratoses of the plantar surface of the right heel and hyperkeratoses of the posterior surface of the left heel..       Stiff person syndrome.    Labile insulin-dependent diabetes.          Review of Systems     Constitutional:  Negative.      Eye:  Negative.      Ear/Nose/Mouth/Throat:  Negative.      Respiratory:  Negative.      Cardiovascular:  Negative.      Gastrointestinal:  Negative.      Genitourinary:  Negative.      Hematology/Lymphatics:  Negative.      Endocrine:  Negative.      Immunologic:  Negative.      Musculoskeletal:  Negative.      Integumentary:  Negative.      Neurologic:  Negative.      Psychiatric:  Negative.      All otther ROS were reviewed and are negative          Health  Status     Allergies:      Allergic Reactions (Selected)    Severity Not Documented    NSAIDs (No reactions were documented)    Nonallergic Reactions (Selected)    Severity Not Documented    Aspirin (Meningitis)     Medications:  (Selected)     Documented Medications    Documented    Advil: po q6 hrs 0 Refill(s) Type: Maintenance    CellCept: ( 500 mg ) bid mL 0 Refill(s) Type: Maintenance    Flovent HFA 220 mcg/inh inhalation aerosol: 2 puff(s) inh bid 0 Refill(s) Type: Maintenance    Flovent HFA 220 mcg/inh inhalation aerosol: inh  bid 0 Refill(s) Type: Maintenance    Lubricant Eye Drops: both eyes bid 0 Refill(s) Type: Maintenance    NovoLOG: subcutaneous tidac 0 Refill(s) Type: Maintenance    Privigen: ( 300 mg/kg ) iv q2 wks 0 Refill(s) Type: Maintenance    ProAir HFA: inh qid 0 Refill(s) Type: Maintenance    Tylenol Extra Strength: ( 1000 mg ) po q6 hrs 0 Refill(s) Type: Maintenance    Valium: ( 5 mg ) bid tab(s) 0 Refill(s) Type: Maintenance    Ventolin HFA: inh qid 0 Refill(s) Type: Maintenance    Vitamin D: po mL 0 Refill(s) Type: Maintenance    atorvastatin 20 mg oral tablet: 1 tab(s) ( 20 mg ) po daily 0 Refill(s) Type: Maintenance    atorvastatin 40 mg oral tablet: 0.5 tab(s) ( 20 mg ) po hs 0 Refill(s) Type: Maintenance    clobetasol 0.05% topical gel: 1 app top bid 0 Refill(s) Type: Maintenance    diazePAM 10 mg oral tablet: 1 tab(s) ( 10 mg ) PO TID PRN: for anxiety 0 Refill(s) Type: Maintenance    mycophenolate mofetil 500 mg oral tablet: 2 tab(s) ( 1000 mg ) po bid 0 Refill(s) Type: Maintenance    oxybutynin 5 mg oral tablet: 1 tab(s) ( 5 mg ) PO TID PRN: for urinary discomfort # 30 tab(s) 0 Refill(s) Type: Maintenance    oxybutynin 5 mg oral tablet: 1 tab(s) ( 5 mg ) PO bid PRN: for urinary discomfort # 30 tab(s) 0 Refill(s) Type: Maintenance    sertraline 100 mg oral tablet: 1 tab(s) ( 100 mg ) po daily 0 Refill(s) Type: Maintenance    tiZANidine 2 mg oral capsule: 2 cap(s) ( 4 mg ) po q8 hrs 0  Refill(s) Type: Maintenance    tizanidine 2 mg oral tablet: 2 tab(s) ( 4 mg ) po q8 hrs 0 Refill(s) Type: Maintenance     Problem list:      All Problems    Asthma / ICD-9-CM 493.90 / Confirmed    Back pain low back / ICD-9-CM 724.2 / Confirmed    Carpal Tunnel Syndrome / ICD-9-CM 354.0 / Confirmed    Diabetes / ICD-9-CM 250.00 / Confirmed    GERD (Gastroesophageal Reflux Disease) / ICD-9-CM 530.81 / Confirmed    High Cholesterol / ICD-9-CM 272.0 / Confirmed    Hypoglycemia / SNOMED CT [id number] / Confirmed    Neck pain / ICD-9-CM 723.1 / Confirmed    Obesity / ICD-9-CM 278.00 / Probable    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 / Confirmed    Sleep apnea / SNOMED CT 194174081 / Confirmed    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    Resolved: Fibromyalgia / ICD-9-CM 729.1    Resolved: HEADACHE / ICD-9-CM 784.0    Resolved: Pulmonary embolism / SNOMED CT 44818563          Histories     Past Medical History:      Active    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    HEADACHE (784.0):  Resolved.    Pulmonary embolism (69629528):  Resolved.    Comments:    10/24/2013 EDT 10:26 AM EDT - Barrie Folk    left     Family History:      Alzheimer's Disease    Father (Deceased)    Aneurysm    Grandmother (M) (Deceased)    Grandmother (P) (Deceased)    Grandfather (P) (Deceased)    Grandfather (M) (Deceased)    Alcohol user    Father (Deceased)    Cancer - unknown origin    Mother (Deceased)    Tobacco user    Father (Deceased)         Procedure history:       Tonsil and adenoid structure (413244010).    Gastric bypass operation (27253664).    Colonoscopy (403474259).    Hysterectomy (563875643).    Carpal tunnel release (329518841).    Ethmoid sinus (66063016).    Arthroscopy of ankle (010932355).    Central line insertion (732202542).     Social History:          Alcohol Assessment: Denies Alcohol Use              Never        Tobacco Assessment: Denies Tobacco Use              Never        Substance Abuse Assessment: Denies Substance Abuse              Dermatologic Exam     Bilateral lower extremeties: warm normal elasticity normal turgor no lesions normal hair growth xerosis.  Findings specific to bilateral toenails: hallux second toes third toes fourth toes fifth toes yellow thickened brittle painful with palpation hypertrophic subungual debris.  Findings specific to the right heel: plantar aspect This Nucleated hyperkeratoses..  Findings specific to the left heel: Posterior hyperkeratoses..  Findings specific to the left toes: medial aspect hallux hyperkeratotic tissue.       Vascular Exam     Arterial pulses are absent for the left dorsalis pedis.  Arterial pulses are absent for the right dorsalis pedis.  Arterial pulses are absent for the left posterior tibial.  Arterial pulses are absent for the right posterior tibial.  Capillary refill is within normal limits.  Edema is not present.       Neurologic Exam     Both lower extremities have normal sensation.  vibration preception     intact bilaterally     Bilateral lower extremity reflexes: ankle absent and negative Babinski.  Nondemonstrable     Monofilament Exam:  intact bilaterally.      Additional information:  Joint position sense intact bilaterally. Bilateral spasm.      SIX MONTH CLASS FINDINGS UPDATE        Skin:         Texture   _  WNL        Color  _   WNL       Temp.    _    WNL              Tugor    _   WNL          Varicosities   _    none        Edema   _     none        Dermatoses  _  xerosis         Hair  _    WNL     Nails  _      1-10    Mycoses   _    yellow/Brown pigmentation      Thickness  _      3-4 mm hypertrophy        Lesions: (Description) _   Hyperkratosis                  Location: _  Plantar surface of the right heel and posterior surface of the left heel.                  Number: _ 2        CIRCULATION:        D.P. _                           Fermoral _               Popliteal _                                                     P.T. _                 Left  _     absent         Right   _    absent_      Left _      Right _        Left _     Right _       Left   _    absent     Right   _     absent        NEUROLOGIC:        Cutanaous _        Joint Position _                  Babinski _         Left   _    WNL      Right   _    WNL         Left   _    WNL     Right    _     WNL                Left   _    negative        Right   _    negative        Ankle _              Vibratory _                        Monofliament _        Left    _   absent           Right  _     absent            Left   _       intact          Right  _      intact  Left  _    WNL          Right    _    WNL            MUSCLE:        Weakness.    _    5/5              Spasm.  _      bilateral                     Clonis.  _      none         PCP visit: 03/17/2010 08/18/2010 03/22/2011 08/2011 09/23/2011 12/2011 09/30/2012 10/10/2012 03/29/2013 11/23/2013 05/2014 05/24/2014 12/29/2014 12/2015        Bernadette Hoit M.D.        PQRS Data Collection Neuropathy        Diabetic Foot and Ankle Care Peripheral Neuropathy  Neurological Evaluation            Lower Extremity Neurological Exam        Motor     Sensory:  reflexes absent_ vibratory_WNL  proprioception WNL_ sharp/dullWNL _ monofilament detection_WNL        Performed  Yes _x No _    Not Performed  Yes _ No _       Reason Not Performed:                Musculoskeletal Exam     Bilaterally the lower extremities have normal strength and 5/5 no clonis bilateral  spasm..  Bilaterally Bunion Bunionette.  Gait Assessment::  Apropulsive.           Impression and Plan     Diagnosis       Arteriosclerosis of the Extremities (ICD10-CM I70.209).       NIDDM with Peripheral Circulatory Disorder (ICD10-CM E11.51).       Insulin long-term use (ICD10-CM Z79.4).       Foot pain right (ICD10-CM M79.671).       Foot pain left (ICD10-CM M79.672).       Onychomycosis due to dermatophyte (ICD10-CM B35.1).       Course:  Well controlled.      Plan:  [place] will be followed every three months..           Procedure: Manual debridement Rotobur reduction.      Follow-up:  In  3  months.      Orders       Orders     In Office Patient Care:    PQRI #126 Diabetes Mellitus: Diabetic Fo (Order)    Requests (Return to Office):    Return to Clinic (Request) (Order): Return in 3 months    Charges:    [phone] low extemity neur exam docum [employer]) (Order): Quantity: 1 NIDDM with Peripheral Circulatory Disorder    11720 debridement of nail(s) by any method(s); 1 to 5 [facility]) (Order): Quantity: 1 Onychomycosis due to dermatophyte  Foot pain right  Foot pain left  NIDDM with Peripheral Circulatory Disorder.           Signed and Authored by Elvina Mattes on 08/19/2016 10:50 AM EDT

## 2016-08-26 DIAGNOSIS — G2582 Stiff-man syndrome: Secondary | ICD-10-CM | POA: Diagnosis not present

## 2016-09-09 DIAGNOSIS — G2582 Stiff-man syndrome: Secondary | ICD-10-CM | POA: Diagnosis not present

## 2016-09-09 DIAGNOSIS — L821 Other seborrheic keratosis: Secondary | ICD-10-CM | POA: Diagnosis not present

## 2016-09-23 DIAGNOSIS — Z9884 Bariatric surgery status: Secondary | ICD-10-CM | POA: Diagnosis not present

## 2016-09-23 DIAGNOSIS — Z903 Acquired absence of stomach [part of]: Secondary | ICD-10-CM | POA: Diagnosis not present

## 2016-09-23 DIAGNOSIS — K912 Postsurgical malabsorption, not elsewhere classified: Secondary | ICD-10-CM | POA: Diagnosis not present

## 2016-09-23 DIAGNOSIS — L821 Other seborrheic keratosis: Secondary | ICD-10-CM | POA: Diagnosis not present

## 2016-09-23 DIAGNOSIS — E1065 Type 1 diabetes mellitus with hyperglycemia: Secondary | ICD-10-CM | POA: Diagnosis not present

## 2016-09-23 DIAGNOSIS — G2582 Stiff-man syndrome: Secondary | ICD-10-CM | POA: Diagnosis not present

## 2016-09-29 DIAGNOSIS — G2582 Stiff-man syndrome: Secondary | ICD-10-CM | POA: Diagnosis not present

## 2016-09-29 DIAGNOSIS — G119 Hereditary ataxia, unspecified: Secondary | ICD-10-CM | POA: Diagnosis not present

## 2016-10-07 DIAGNOSIS — I493 Ventricular premature depolarization: Secondary | ICD-10-CM | POA: Diagnosis not present

## 2016-10-07 DIAGNOSIS — G2582 Stiff-man syndrome: Secondary | ICD-10-CM | POA: Diagnosis not present

## 2016-10-07 DIAGNOSIS — L821 Other seborrheic keratosis: Secondary | ICD-10-CM | POA: Diagnosis not present

## 2016-10-20 DIAGNOSIS — J4530 Mild persistent asthma, uncomplicated: Secondary | ICD-10-CM | POA: Diagnosis not present

## 2016-10-20 DIAGNOSIS — Z903 Acquired absence of stomach [part of]: Secondary | ICD-10-CM | POA: Diagnosis not present

## 2016-10-20 DIAGNOSIS — E213 Hyperparathyroidism, unspecified: Secondary | ICD-10-CM | POA: Diagnosis not present

## 2016-10-20 DIAGNOSIS — Z9989 Dependence on other enabling machines and devices: Secondary | ICD-10-CM | POA: Diagnosis not present

## 2016-10-20 DIAGNOSIS — K912 Postsurgical malabsorption, not elsewhere classified: Secondary | ICD-10-CM | POA: Insufficient documentation

## 2016-10-20 DIAGNOSIS — G4733 Obstructive sleep apnea (adult) (pediatric): Secondary | ICD-10-CM | POA: Diagnosis not present

## 2016-10-20 DIAGNOSIS — E10649 Type 1 diabetes mellitus with hypoglycemia without coma: Secondary | ICD-10-CM | POA: Diagnosis not present

## 2016-10-20 DIAGNOSIS — E109 Type 1 diabetes mellitus without complications: Secondary | ICD-10-CM | POA: Diagnosis not present

## 2016-10-20 DIAGNOSIS — Z9884 Bariatric surgery status: Secondary | ICD-10-CM | POA: Diagnosis not present

## 2016-10-20 DIAGNOSIS — Z6823 Body mass index (BMI) 23.0-23.9, adult: Secondary | ICD-10-CM | POA: Diagnosis not present

## 2016-10-21 DIAGNOSIS — L821 Other seborrheic keratosis: Secondary | ICD-10-CM | POA: Diagnosis not present

## 2016-10-21 DIAGNOSIS — E785 Hyperlipidemia, unspecified: Secondary | ICD-10-CM | POA: Diagnosis not present

## 2016-10-21 DIAGNOSIS — G2582 Stiff-man syndrome: Secondary | ICD-10-CM | POA: Diagnosis not present

## 2016-10-21 DIAGNOSIS — E1065 Type 1 diabetes mellitus with hyperglycemia: Secondary | ICD-10-CM | POA: Diagnosis not present

## 2016-10-21 DIAGNOSIS — I10 Essential (primary) hypertension: Secondary | ICD-10-CM | POA: Diagnosis not present

## 2016-10-22 DIAGNOSIS — E109 Type 1 diabetes mellitus without complications: Secondary | ICD-10-CM | POA: Diagnosis not present

## 2016-11-04 DIAGNOSIS — G2582 Stiff-man syndrome: Secondary | ICD-10-CM | POA: Diagnosis not present

## 2016-11-04 DIAGNOSIS — L821 Other seborrheic keratosis: Secondary | ICD-10-CM | POA: Diagnosis not present

## 2016-11-10 DIAGNOSIS — L821 Other seborrheic keratosis: Secondary | ICD-10-CM | POA: Diagnosis not present

## 2016-11-10 DIAGNOSIS — D225 Melanocytic nevi of trunk: Secondary | ICD-10-CM | POA: Diagnosis not present

## 2016-11-10 DIAGNOSIS — L570 Actinic keratosis: Secondary | ICD-10-CM | POA: Diagnosis not present

## 2016-11-18 DIAGNOSIS — G2582 Stiff-man syndrome: Secondary | ICD-10-CM | POA: Diagnosis not present

## 2016-11-19 ENCOUNTER — Ambulatory Visit: Admitting: Podiatrist

## 2016-11-19 DIAGNOSIS — B351 Tinea unguium: Secondary | ICD-10-CM | POA: Diagnosis not present

## 2016-11-19 DIAGNOSIS — M79671 Pain in right foot: Secondary | ICD-10-CM | POA: Diagnosis not present

## 2016-11-19 DIAGNOSIS — M79672 Pain in left foot: Secondary | ICD-10-CM | POA: Diagnosis not present

## 2016-11-19 DIAGNOSIS — E1151 Type 2 diabetes mellitus with diabetic peripheral angiopathy without gangrene: Secondary | ICD-10-CM | POA: Diagnosis not present

## 2016-11-21 NOTE — Progress Notes (Signed)
Patient:   Paula Mcmillan            MRN: [id number]            FIN: [id number] FAAF                 Age:   66 years     Sex:  Female     DOB:  12/07/1950     Associated Diagnoses:   Arteriosclerosis of the Extremities; NIDDM with Peripheral Circulatory Disorder; Insulin long-term use; Foot pain right; Foot pain left; Onychomycosis due to dermatophyte     Author:   Kathalene Frames          Visit Information          Date of Service: 11/19/2016 07:59 am  Performing Location: [facility]  Encounter#: [id number] FAAF     Visit type:  Diabetic foot care Established patient.      Accompanied by:  No one.      Source of history:  Self.      Referral source:  Self.      History limitation:  None.           Chief Complaint     Insulin-dependent diabetic with onychomycosis 1-10. 3-4 mm hypertrophy. Yellow/Brown pigmentation. Ingrowing and painful. Limit ambulation.The left hallux is developing a hyperkeratosis along the lateral groove due to insult from the nail plate.    Nucleated hyperkeratosis of the right heel.    Posterior hyperkeratosis of the left heel.     11/19/2016 9:00 AM EDT     dfc            History of Present Illness          The patient presents with onychomycosis and     Nucleated hyperkeratoses of the plantar surface of the right heel and hyperkeratoses of the posterior surface of the left heel..       Stiff person syndrome.    Labile insulin-dependent diabetes.          Review of Systems     Constitutional:  Negative.      Eye:  Negative.      Ear/Nose/Mouth/Throat:  Negative.      Respiratory:  Negative.      Cardiovascular:  Negative.      Gastrointestinal:  Negative.      Genitourinary:  Negative.      Hematology/Lymphatics:  Negative.      Endocrine:  Negative.      Immunologic:  Negative.      Musculoskeletal:  Negative.      Integumentary:  Negative.      Neurologic:  Negative.      Psychiatric:  Negative.      All otther ROS were reviewed and are negative          Health  Status     Allergies:      Allergic Reactions (Selected)    Severity Not Documented    NSAIDs (No reactions were documented)    Nonallergic Reactions (Selected)    Severity Not Documented    Aspirin (Meningitis)     Medications:  (Selected)     Documented Medications    Documented    Advil: po q6 hrs 0 Refill(s) Type: Maintenance    CellCept: ( 500 mg ) bid mL 0 Refill(s) Type: Maintenance    Flovent HFA 220 mcg/inh inhalation aerosol: 2 puff(s) inh bid 0 Refill(s) Type: Maintenance    Lubricant Eye Drops: both eyes bid  0 Refill(s) Type: Maintenance    NovoLOG: Subcutaneous tidac 0 Refill(s) Type: Maintenance    NovoLOG: subcutaneous tidac 0 Refill(s) Type: Maintenance    Privigen: ( 300 mg/kg ) iv q2 wks 0 Refill(s) Type: Maintenance    ProAir HFA: inh qid 0 Refill(s) Type: Maintenance    Tylenol Extra Strength: ( 1000 mg ) po q6 hrs 0 Refill(s) Type: Maintenance    Valium: ( 5 mg ) bid tab(s) 0 Refill(s) Type: Maintenance    Ventolin HFA: inh qid 0 Refill(s) Type: Maintenance    Vitamin D: po mL 0 Refill(s) Type: Maintenance    atorvastatin 20 mg oral tablet: 1 tab(s) ( 20 mg ) po daily 0 Refill(s) Type: Maintenance    clobetasol 0.05% topical gel: 1 app top bid 0 Refill(s) Type: Maintenance    diazePAM 10 mg oral tablet: 1 tab(s) ( 10 mg ) PO TID PRN: for anxiety 0 Refill(s) Type: Maintenance    mycophenolate mofetil 500 mg oral tablet: 2 tab(s) ( 1000 mg ) po bid 0 Refill(s) Type: Maintenance    oxybutynin 5 mg oral tablet: 1 tab(s) ( 5 mg ) PO bid PRN: for urinary discomfort # 30 tab(s) 0 Refill(s) Type: Maintenance    sertraline 100 mg oral tablet: 1 tab(s) ( 100 mg ) po daily 0 Refill(s) Type: Maintenance    tiZANidine 2 mg oral capsule: 2 cap(s) ( 4 mg ) po q8 hrs 0 Refill(s) Type: Maintenance     Problem list:      All Problems    Asthma / ICD-9-CM 493.90 / Confirmed    Back pain low back / ICD-9-CM 724.2 / Confirmed    Carpal Tunnel Syndrome / ICD-9-CM 354.0 / Confirmed    Diabetes / ICD-9-CM 250.00 /  Confirmed    GERD (Gastroesophageal Reflux Disease) / ICD-9-CM 530.81 / Confirmed    High Cholesterol / ICD-9-CM 272.0 / Confirmed    Hypoglycemia / SNOMED CT [id number] / Confirmed    Neck pain / ICD-9-CM 723.1 / Confirmed    Obesity / ICD-9-CM 278.00 / Probable    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 / Confirmed    Sleep apnea / SNOMED CT 841660630 / Confirmed    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    Resolved: Fibromyalgia / ICD-9-CM 729.1    Resolved: HEADACHE / ICD-9-CM 784.0    Resolved: Pulmonary embolism / SNOMED CT 16010932          Histories     Past Medical History:      Active    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    HEADACHE (784.0):  Resolved.    Pulmonary embolism (54627035):  Resolved.    Comments:    10/24/2013 EDT 10:26 AM EDT - Barrie Folk    left     Family History:      Alzheimer's Disease    Father (Deceased)    Aneurysm    Grandmother (M) (Deceased)    Grandmother (P) (Deceased)  Grandfather (P) (Deceased)    Grandfather (M) (Deceased)    Alcohol user    Father (Deceased)    Cancer - unknown origin    Mother (Deceased)    Tobacco user    Father (Deceased)         Procedure history:      Tonsil and adenoid structure (161096045).    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    Colonoscopy (478295621).    Hysterectomy (308657846).    Carpal tunnel release (962952841).    Ethmoid sinus (32440102).    Arthroscopy of ankle (725366440).    Central line insertion (347425956).     Social History:          Alcohol Assessment:  Denies Alcohol Use              Never        Tobacco Assessment: Denies Tobacco Use              Never        Substance Abuse Assessment: Denies Substance Abuse              Dermatologic Exam     Bilateral lower extremeties: warm normal elasticity normal turgor no lesions normal hair growth xerosis.  Findings specific to bilateral toenails: hallux second toes third toes fourth toes fifth toes yellow thickened brittle painful with palpation hypertrophic subungual debris.  Findings specific to the right heel: plantar aspect This Nucleated hyperkeratoses..  Findings specific to the left heel: Posterior hyperkeratoses..  Findings specific to the left toes: medial aspect hallux hyperkeratotic tissue.       Vascular Exam     Arterial pulses are absent for the left dorsalis pedis.  Arterial pulses are absent for the right dorsalis pedis.  Arterial pulses are absent for the left posterior tibial.  Arterial pulses are absent for the right posterior tibial.  Capillary refill is within normal limits.  Edema is not present.       Neurologic Exam     Both lower extremities have normal sensation.  vibration preception     intact bilaterally     Bilateral lower extremity reflexes: ankle absent and negative Babinski.  Nondemonstrable     Monofilament Exam:  intact bilaterally.      Additional information:  Joint position sense intact bilaterally. Bilateral spasm.      SIX MONTH CLASS FINDINGS UPDATE        Skin:         Texture   _  WNL        Color  _   WNL       Temp.    _    WNL              Tugor    _   WNL          Varicosities   _    none        Edema   _     none        Dermatoses  _    xerosis        Hair  _    WNL     Nails  _      1-10    Mycoses   _    yellow/Brown pigmentation      Thickness  _      3-4 mm hypertrophy        Lesions: (Description) _   Hyperkratosis  Location: _  Plantar surface of the right heel and posterior surface of the left heel.                  Number: _  2        CIRCULATION:        D.P. _                           Fermoral _               Popliteal _                                                     P.T. _                 Left  _     absent         Right   _    absent_      Left _      Right _        Left _     Right _       Left   _    absent     Right   _     absent        NEUROLOGIC:        Cutanaous _        Joint Position _                  Babinski _         Left   _    WNL      Right   _    WNL         Left   _    WNL     Right    _     WNL                Left   _    negative        Right   _    negative        Ankle _              Vibratory _                        Monofliament _        Left    _   absent           Right  _     absent            Left   _       intact          Right  _      intact              Left  _    WNL          Right    _    WNL            MUSCLE:        Weakness.    _    5/5              Spasm.  _  bilateral                     Clonis.  _      none         PCP visit: 03/17/2010 08/18/2010 03/22/2011 08/2011 09/23/2011 12/2011 09/30/2012 10/10/2012 03/29/2013 11/23/2013 05/2014 05/24/2014 12/29/2014 12/2015 07/2016        Bernadette Hoit M.D.        PQRS Data Collection Neuropathy        Diabetic Foot and Ankle Care Peripheral Neuropathy  Neurological Evaluation            Lower Extremity Neurological Exam        Motor     Sensory:  reflexes absent_ vibratory_WNL  proprioception WNL_ sharp/dullWNL _ monofilament detection_WNL        Performed  Yes _x No _    Not Performed  Yes _ No _       Reason Not Performed:                Musculoskeletal Exam     Bilaterally the lower extremities have normal strength and 5/5 no clonis bilateral spasm..  Bilaterally Bunion Bunionette.  Gait Assessment::  Apropulsive.           Impression and Plan     Diagnosis       Arteriosclerosis of the Extremities (ICD10-CM I70.209).       NIDDM with Peripheral Circulatory Disorder (ICD10-CM E11.51).       Insulin long-term use (ICD10-CM Z79.4).       Foot  pain right (ICD10-CM M79.671).       Foot pain left (ICD10-CM M79.672).       Onychomycosis due to dermatophyte (ICD10-CM B35.1).       Course:  Well controlled.      Plan:  Merve will be followed every three months..           Procedure: Manual debridement Rotobur reduction.      Follow-up:  In  3  months.      Summary:  New meter for blood glucose levels. Change in medications were noted..      Orders       Orders     In Office Patient Care:    PQRI #126 Diabetes Mellitus: Diabetic Fo (Order)    Requests (Return to Office):    Return to Clinic (Request) (Order): Return in 3 months    Charges:    [phone] low extemity neur exam docum [employer]) (Order): Quantity: 1 NIDDM with Peripheral Circulatory Disorder    11720 debridement of nail(s) by any method(s); 1 to 5 [employer]) (Order): Quantity: 1 Onychomycosis due to dermatophyte  Foot pain right  Foot pain left  NIDDM with Peripheral Circulatory Disorder.           Signed and Authored by Elvina Mattes on 11/19/2016 09:19 AM EDT

## 2016-11-21 NOTE — Discharge Summary (Signed)
Visit Summary for Paula Mcmillan      We would like to thank you for allowing [place] to assist you with your healthcare needs. Our entire staff strives to provide an        excellent experience for our patients and their families. The following includes information regarding your visit.            Age: 66 years     Sex: Female     DOB: 09-07-1950     MRN: [id number]        Address: [address] [place] [address]        Home: [phone]     Work: --     Mobile: --        Primary Care Provider: Teddy Spike        Race: White     Ethnicity: Not Hispanic or Latino        Preferred Language: English        Health Plan: Part B Claims 2[employer] [facility] Claims      Visit Information                                                                                                  [facility]        [address] [place] [address]        Phone: [phone]     Fax: [phone]            Visit Date: 11/19/2016  09:30 am        Scheduled Provider: [facility]        Visit Provider: Kathalene Frames        Referring Provider: Teddy Spike        Reason for Visit: dfc         Future Appointments                                                                                          Appt. 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 (625.8)        Sleep apnea (601093235)        Stiff Person Syndrome  (333.91)         Allergies                                                                                                                   NSAIDs    aspirin Reactions: Meningitis         All Known Current Prescriptions and Reported Medications                         Prescriptions        No medications documented            Other Reported Medications         Advil (ibuprofen)               oral every 6 hours         atorvastatin 20 mg oral tablet (atorvastatin)              Take 1 tab(s)(20 Milligram) oral daily         CellCept (mycophenolate mofetil)              Take 500 Milligram 2 times a day         clobetasol 0.05% topical gel (clobetasol topical)              Take 1 app topical 2 times a day         diazePAM 10 mg oral tablet (diazePAM)              Take 1 tab(s)(10 Milligram) oral 3 times a day for anxiety         Flovent HFA 220 mcg/inh inhalation aerosol (fluticasone)              Take 2 puff(s) inhalation 2 times a day         Lubricant Eye Drops (ocular lubricant)               both eyes 2 times a day         mycophenolate mofetil 500 mg oral tablet (mycophenolate mofetil)              Take 2 tab(s)(1000 Milligram) oral 2 times a day         NovoLOG (insulin aspart)  subcutaneous 3 times a day before meals         oxybutynin 5 mg oral tablet (oxybutynin)              Take 1 tab(s)(5 Milligram) oral 2 times a day for urinary discomfort         Privigen (immune globulin intravenous)              Take 300 Milligrams/Kilogram intravenous every other week         ProAir HFA (albuterol)               inhalation 4 times a day         sertraline 100 mg oral tablet (sertraline)              Take 1 tab(s)(100 Milligram) oral daily         tiZANidine 2 mg oral capsule (tiZANidine)              Take 2 cap(4 Milligram) oral every 8 hours         Tylenol Extra Strength (acetaminophen)              Take 1000 Milligram oral every 6 hours         Valium (diazepam)              Take 5 Milligram 2 times a  day         Ventolin HFA (albuterol)               inhalation 4 times a day         Vitamin D (ergocalciferol)               oral         New Medications this Visit                                                                                 New Prescriptions        No new prescriptions for this visit         Vitals and Measurements this Visit (last charted value for your 11/19/2016 visit)                                   No vitals and measurements documented         Laboratory and Radiology this Visit (last charted value for your 11/19/2016 visit)                                  No Laboratory and Radiology documented         Orders this Visit  Requests (Return to Office)         Return to Clinic         Requested Date: 11/19/2016             Return in 3 months         Diagnoses this Visit                                                                                            Arteriosclerosis of the Extremities (I70.209)        Foot pain left (Y86.578)        Foot pain right (M79.671)        Insulin long-term use (Z79.4)        NIDDM with Peripheral Circulatory Disorder (E11.51)        Onychomycosis due to dermatophyte (B35.1)         Referral and Consult Requests this Visit                                                          No Referral or Consults documented         Procedures Documented this Visit                                                                        Debridement of nail(s) by any method(s); 1 to 5         Smoking Status                                                                                                   Never smoker             Patient Education Materials Provided this Visit                                               No Patient Education documented            Additional Visit Comments:

## 2016-11-21 NOTE — Initial Assessments (Signed)
Quick Intake Entered On:  11/19/2016 9:00 AM EDT      Performed On:  11/19/2016 9:00 AM EDT by Theodoro Kalata                         Summary     Chief Complaint :   dfc     Race :   Caucasian     Languages :   English     Ethnicity :   Not Hispanic or Latino     Duncan  Nadene - 11/19/2016 9:00 AM EDT

## 2016-12-02 DIAGNOSIS — L821 Other seborrheic keratosis: Secondary | ICD-10-CM | POA: Diagnosis not present

## 2016-12-02 DIAGNOSIS — G2582 Stiff-man syndrome: Secondary | ICD-10-CM | POA: Diagnosis not present

## 2016-12-13 DIAGNOSIS — M47816 Spondylosis without myelopathy or radiculopathy, lumbar region: Secondary | ICD-10-CM | POA: Diagnosis not present

## 2016-12-13 DIAGNOSIS — M47812 Spondylosis without myelopathy or radiculopathy, cervical region: Secondary | ICD-10-CM | POA: Diagnosis not present

## 2016-12-16 DIAGNOSIS — G2582 Stiff-man syndrome: Secondary | ICD-10-CM | POA: Diagnosis not present

## 2016-12-16 DIAGNOSIS — L821 Other seborrheic keratosis: Secondary | ICD-10-CM | POA: Diagnosis not present

## 2016-12-30 DIAGNOSIS — G4733 Obstructive sleep apnea (adult) (pediatric): Secondary | ICD-10-CM | POA: Diagnosis not present

## 2016-12-30 DIAGNOSIS — G2582 Stiff-man syndrome: Secondary | ICD-10-CM | POA: Diagnosis not present

## 2017-01-06 DIAGNOSIS — Z23 Encounter for immunization: Secondary | ICD-10-CM | POA: Diagnosis not present

## 2017-01-13 DIAGNOSIS — G2582 Stiff-man syndrome: Secondary | ICD-10-CM | POA: Diagnosis not present

## 2017-01-18 DIAGNOSIS — Z9989 Dependence on other enabling machines and devices: Secondary | ICD-10-CM | POA: Diagnosis not present

## 2017-01-18 DIAGNOSIS — F3342 Major depressive disorder, recurrent, in full remission: Secondary | ICD-10-CM | POA: Diagnosis not present

## 2017-01-18 DIAGNOSIS — F329 Major depressive disorder, single episode, unspecified: Secondary | ICD-10-CM | POA: Diagnosis not present

## 2017-01-18 DIAGNOSIS — Z13820 Encounter for screening for osteoporosis: Secondary | ICD-10-CM | POA: Diagnosis not present

## 2017-01-18 DIAGNOSIS — N3941 Urge incontinence: Secondary | ICD-10-CM | POA: Diagnosis not present

## 2017-01-18 DIAGNOSIS — E785 Hyperlipidemia, unspecified: Secondary | ICD-10-CM | POA: Diagnosis not present

## 2017-01-18 DIAGNOSIS — D649 Anemia, unspecified: Secondary | ICD-10-CM | POA: Diagnosis not present

## 2017-01-18 DIAGNOSIS — G4733 Obstructive sleep apnea (adult) (pediatric): Secondary | ICD-10-CM | POA: Diagnosis not present

## 2017-01-18 DIAGNOSIS — E109 Type 1 diabetes mellitus without complications: Secondary | ICD-10-CM | POA: Diagnosis not present

## 2017-01-27 DIAGNOSIS — G2582 Stiff-man syndrome: Secondary | ICD-10-CM | POA: Diagnosis not present

## 2017-01-27 DIAGNOSIS — E1065 Type 1 diabetes mellitus with hyperglycemia: Secondary | ICD-10-CM | POA: Diagnosis not present

## 2017-01-27 DIAGNOSIS — D649 Anemia, unspecified: Secondary | ICD-10-CM | POA: Diagnosis not present

## 2017-02-07 DIAGNOSIS — H40003 Preglaucoma, unspecified, bilateral: Secondary | ICD-10-CM | POA: Diagnosis not present

## 2017-02-08 DIAGNOSIS — Z780 Asymptomatic menopausal state: Secondary | ICD-10-CM | POA: Diagnosis not present

## 2017-02-08 DIAGNOSIS — Z13820 Encounter for screening for osteoporosis: Secondary | ICD-10-CM | POA: Diagnosis not present

## 2017-02-10 DIAGNOSIS — G2582 Stiff-man syndrome: Secondary | ICD-10-CM | POA: Diagnosis not present

## 2017-02-10 DIAGNOSIS — E109 Type 1 diabetes mellitus without complications: Secondary | ICD-10-CM | POA: Diagnosis not present

## 2017-02-24 DIAGNOSIS — G2582 Stiff-man syndrome: Secondary | ICD-10-CM | POA: Diagnosis not present

## 2017-02-25 DIAGNOSIS — E785 Hyperlipidemia, unspecified: Secondary | ICD-10-CM | POA: Diagnosis not present

## 2017-02-25 DIAGNOSIS — J45909 Unspecified asthma, uncomplicated: Secondary | ICD-10-CM | POA: Diagnosis not present

## 2017-02-25 DIAGNOSIS — T1490XA Injury, unspecified, initial encounter: Secondary | ICD-10-CM | POA: Diagnosis not present

## 2017-02-25 DIAGNOSIS — E109 Type 1 diabetes mellitus without complications: Secondary | ICD-10-CM | POA: Diagnosis not present

## 2017-02-25 DIAGNOSIS — Z794 Long term (current) use of insulin: Secondary | ICD-10-CM | POA: Diagnosis not present

## 2017-02-25 DIAGNOSIS — R531 Weakness: Secondary | ICD-10-CM | POA: Diagnosis not present

## 2017-02-25 DIAGNOSIS — Z79899 Other long term (current) drug therapy: Secondary | ICD-10-CM | POA: Diagnosis not present

## 2017-02-25 DIAGNOSIS — S96912A Strain of unspecified muscle and tendon at ankle and foot level, left foot, initial encounter: Secondary | ICD-10-CM | POA: Diagnosis not present

## 2017-02-25 DIAGNOSIS — Z9181 History of falling: Secondary | ICD-10-CM | POA: Diagnosis not present

## 2017-02-25 DIAGNOSIS — S99919D Unspecified injury of unspecified ankle, subsequent encounter: Secondary | ICD-10-CM | POA: Diagnosis not present

## 2017-03-02 DIAGNOSIS — S93402D Sprain of unspecified ligament of left ankle, subsequent encounter: Secondary | ICD-10-CM | POA: Diagnosis not present

## 2017-03-03 ENCOUNTER — Ambulatory Visit: Admitting: Podiatrist

## 2017-03-03 DIAGNOSIS — M79672 Pain in left foot: Secondary | ICD-10-CM | POA: Diagnosis not present

## 2017-03-03 DIAGNOSIS — B351 Tinea unguium: Secondary | ICD-10-CM | POA: Diagnosis not present

## 2017-03-03 DIAGNOSIS — E1151 Type 2 diabetes mellitus with diabetic peripheral angiopathy without gangrene: Secondary | ICD-10-CM | POA: Diagnosis not present

## 2017-03-03 DIAGNOSIS — M79671 Pain in right foot: Secondary | ICD-10-CM | POA: Diagnosis not present

## 2017-03-05 NOTE — Discharge Summary (Signed)
Visit Summary for Paula Mcmillan  We would like to thank you for allowing [place] to assist you with your healthcare needs. Our entire staff strives to provide an    excellent experience for our patients and their families. The following includes information regarding your visit.        Age: 66 yearsSex: FemaleDOB: March 02, 1952MRN: [id number]    Address: [address] [place] [address]    Home: [phone]: --Mobile: --    Primary Care Provider: Teddy Spike    Race: WhiteEthnicity: Not Hispanic or Latino    Preferred Language: English    Health Plan: Part B Claims 2[employer] Claims         Visit Information    [facility]    [address] [place] [address]    Phone: [phone]: [phone]        Visit Date: 12/20/201801:15 pm    Scheduled Provider: [facility]    Visit Provider: Kathalene Frames    Referring Provider: Teddy Spike    Reason for Visit: dfc            Future Appointments    No future appointments scheduled            Problems and Health Issues    Asthma (493.90)    Back pain low back (724.2)    Carpal Tunnel Syndrome (354.0)    Diabetes (250.00)    GERD (Gastroesophageal Reflux Disease) (530.81)    High Cholesterol (272.0)    Hypoglycemia ([id number])    Neck pain (723.1)    Obesity (278.00)    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 (625.8)    Sleep apnea (235573220)    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)            Allergies    NSAIDs    aspirinReactions: Meningitis            All Known Current Prescriptions and Reported Medications    Prescriptions    No medications documented        Other Reported Medications    Advil (ibuprofen)    oralevery 6 hours    atorvastatin 20 mg oral tablet (atorvastatin)    Take 1tab(s)(20Milligram)oraldaily    CellCept (mycophenolate mofetil)    Take 500Milligram2 times a day    clobetasol 0.05% topical gel (clobetasol topical)    Take 1apptopical2 times a  day    diazePAM 10 mg oral tablet (diazePAM)    Take 1tab(s)(10Milligram)oral3 times a dayfor anxiety    Flovent HFA 220 mcg/inh inhalation aerosol (fluticasone)    Take 2puff(s)inhalation2 times a day    Lubricant Eye Drops (ocular lubricant)    both eyes2 times a day    mycophenolate mofetil 500 mg oral tablet (mycophenolate mofetil)    Take 2tab(s)(1000Milligram)oral2 times a day    NovoLOG (insulin aspart)    subcutaneous3 times a day before meals    oxybutynin 5 mg oral tablet (oxybutynin)    Take 1tab(s)(5Milligram)oral2 times a dayfor urinary discomfort    Privigen (immune globulin intravenous)    Take 300Milligrams/Kilogramintravenousevery other week    ProAir HFA (albuterol)    inhalation4 times a day    sertraline 100 mg oral tablet (sertraline)    Take 1tab(s)(100Milligram)oraldaily    tiZANidine 2 mg oral capsule (tiZANidine)    Take 2cap(4Milligram)oralevery 8 hours    Tylenol Extra Strength (acetaminophen)    Take 6 hours  Valium (diazepam)    Take 5Milligram2 times a day    Ventolin HFA (albuterol)    inhalation4 times a day    Vitamin D (ergocalciferol)    oral            New Medications this Visit    New Prescriptions    No new prescriptions for this visit            Vitals and Measurements this Visit (last charted value for your 03/03/2017 visit)    No vitals and measurements documented            Laboratory and Radiology this Visit (last charted value for your 03/03/2017 visit)    Diagnostic Radiology     XR Report:             Orders this Visit    Requests (Return to Office)    Return to ClinicRequested Date: 03/03/2017    Return in 3 months            Diagnoses this Visit    Arteriosclerosis of the Extremities (I70.209)    Foot pain left (M79.672)    Foot pain right (M79.671)    Insulin long-term use (Z79.4)    NIDDM with Peripheral Circulatory Disorder (E11.51)    Onychomycosis due to dermatophyte (B35.1)            Referral and Consult Requests this Visit    No  Referral or Consults documented            Procedures Documented this Visit        Debridementofnail(s)byanymethod(s);1to5            Smoking Status    Neversmoker                Patient Education Materials Provided this Visit    No Patient Education documented                Additional Visit Comments:

## 2017-03-05 NOTE — Initial Assessments (Signed)
Quick Intake Entered On:  03/03/2017 1:02 PM EST      Performed On:  03/03/2017 1:02 PM EST by Theodoro Kalata                         Summary     Chief Complaint :   dfc     Race :   Caucasian     Languages :   English     Ethnicity :   Not Hispanic or Latino     Theodoro Kalata - 03/03/2017 1:02 PM EST

## 2017-03-05 NOTE — Progress Notes (Signed)
Patient:   Paula Mcmillan            MRN: [id number]            FIN: [id number]                 Age:   66 years     Sex:  Female     DOB:  Feb 05, 1951     Associated Diagnoses:   Arteriosclerosis of the Extremities; NIDDM with Peripheral Circulatory Disorder; Insulin long-term use; Foot pain right; Foot pain left; Onychomycosis due to dermatophyte     Author:   Kathalene Frames          Visit Information          Date of Service: 03/03/2017 11:36 am  Performing Location: [facility]  Encounter#: [id number]     Visit type:  Diabetic foot care Established patient.      Accompanied by:  No one.      Source of history:  Self.      Referral source:  Self.      History limitation:  None.           Chief Complaint     Insulin-dependent diabetic with onychomycosis 1-10. 3-4 mm hypertrophy. Yellow/Brown pigmentation. Ingrowing and painful. Limit ambulation.The left hallux is developing a hyperkeratosis along the lateral groove due to insult from the nail plate.    Nucleated hyperkeratosis of the right heel.    Posterior hyperkeratosis of the left heel.     03/03/2017 1:02 PM EST   dfc            History of Present Illness          The patient presents with onychomycosis and     Nucleated hyperkeratoses of the plantar surface of the right heel and hyperkeratoses of the posterior surface of the left heel..       Stiff person syndrome.    Labile insulin-dependent diabetes.          Review of Systems     Constitutional:  Negative.      Eye:  Negative.      Ear/Nose/Mouth/Throat:  Negative.      Respiratory:  Negative.      Cardiovascular:  Negative.      Gastrointestinal:  Negative.      Genitourinary:  Negative.      Hematology/Lymphatics:  Negative.      Endocrine:  Negative.      Immunologic:  Negative.      Musculoskeletal:  Negative.      Integumentary:  Negative.      Neurologic:  Negative.      Psychiatric:  Negative.      All otther ROS were reviewed and are negative          Health  Status     Allergies:      Allergic Reactions (Selected)    Severity Not Documented    NSAIDs (No reactions were documented)    Nonallergic Reactions (Selected)    Severity Not Documented    Aspirin (Meningitis)     Medications:  (Selected)     Documented Medications    Documented    Advil: po q6 hrs 0 Refill(s) Type: Maintenance    CellCept: ( 500 mg ) bid mL 0 Refill(s) Type: Maintenance    Flovent HFA 220 mcg/inh inhalation aerosol: 2 puff(s) inh bid 0 Refill(s) Type: Maintenance    Lubricant Eye Drops: both eyes bid 0 Refill(s)  Type: Maintenance    NovoLOG: Subcutaneous tidac 0 Refill(s) Type: Maintenance    NovoLOG: subcutaneous tidac 0 Refill(s) Type: Maintenance    Privigen: ( 300 mg/kg ) iv q2 wks 0 Refill(s) Type: Maintenance    ProAir HFA: inh qid 0 Refill(s) Type: Maintenance    Tylenol Extra Strength: ( 1000 mg ) po q6 hrs 0 Refill(s) Type: Maintenance    Valium: ( 5 mg ) bid tab(s) 0 Refill(s) Type: Maintenance    Ventolin HFA: inh qid 0 Refill(s) Type: Maintenance    Vitamin D: po mL 0 Refill(s) Type: Maintenance    atorvastatin 20 mg oral tablet: 1 tab(s) ( 20 mg ) po daily 0 Refill(s) Type: Maintenance    clobetasol 0.05% topical gel: 1 app top bid 0 Refill(s) Type: Maintenance    diazePAM 10 mg oral tablet: 1 tab(s) ( 10 mg ) PO TID PRN: for anxiety 0 Refill(s) Type: Maintenance    mycophenolate mofetil 500 mg oral tablet: 2 tab(s) ( 1000 mg ) po bid 0 Refill(s) Type: Maintenance    oxybutynin 5 mg oral tablet: 1 tab(s) ( 5 mg ) PO bid PRN: for urinary discomfort # 30 tab(s) 0 Refill(s) Type: Maintenance    sertraline 100 mg oral tablet: 1 tab(s) ( 100 mg ) po daily 0 Refill(s) Type: Maintenance    tiZANidine 2 mg oral capsule: 2 cap(s) ( 4 mg ) po q8 hrs 0 Refill(s) Type: Maintenance     Problem list:      All Problems    Asthma / ICD-9-CM 493.90 / Confirmed    Back pain low back / ICD-9-CM 724.2 / Confirmed    Carpal Tunnel Syndrome / ICD-9-CM 354.0 / Confirmed    Diabetes / ICD-9-CM 250.00 /  Confirmed    GERD (Gastroesophageal Reflux Disease) / ICD-9-CM 530.81 / Confirmed    High Cholesterol / ICD-9-CM 272.0 / Confirmed    Hypoglycemia / SNOMED CT [id number] / Confirmed    Neck pain / ICD-9-CM 723.1 / Confirmed    Obesity / ICD-9-CM 278.00 / Probable    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 / Confirmed    Sleep apnea / SNOMED CT 846962952 / Confirmed    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    Resolved: Fibromyalgia / ICD-9-CM 729.1    Resolved: HEADACHE / ICD-9-CM 784.0    Resolved: Pulmonary embolism / SNOMED CT 84132440          Histories     Past Medical History:      Active    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    HEADACHE (784.0):  Resolved.    Pulmonary embolism (30160109):  Resolved.    Comments:    10/24/2013 EDT 10:26 AM EDT - Barrie Folk    left     Family History:      Alzheimer's Disease    Father (Deceased)    Aneurysm    Grandmother (M) (Deceased)    Grandmother (P) (Deceased)  Grandfather (P) (Deceased)    Grandfather (M) (Deceased)    Alcohol user    Father (Deceased)    Cancer - unknown origin    Mother (Deceased)    Tobacco user    Father (Deceased)         Procedure history:      Tonsil and adenoid structure (657846962).    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    Colonoscopy (440102725).    Hysterectomy (366440347).    Carpal tunnel release (425956387).    Ethmoid sinus (56433295).    Arthroscopy of ankle (188416606).    Central line insertion (301601093).     Social History:          Alcohol Assessment:  Denies Alcohol Use              Never        Tobacco Assessment: Denies Tobacco Use              Never        Substance Abuse Assessment: Denies Substance Abuse          Dermatologic Exam     Bilateral lower extremeties: warm normal elasticity normal turgor no lesions normal hair growth xerosis.  Findings specific to bilateral toenails: hallux second toes third toes fourth toes fifth toes yellow thickened brittle painful with palpation hypertrophic subungual debris.  Findings specific to the right heel: plantar aspect This Nucleated hyperkeratoses..  Findings specific to the left heel: Posterior hyperkeratoses..  Findings specific to the left toes: medial aspect hallux hyperkeratotic tissue.       Vascular Exam     Arterial pulses are absent for the left dorsalis pedis.  Arterial pulses are absent for the right dorsalis pedis.  Arterial pulses are absent for the left posterior tibial.  Arterial pulses are absent for the right posterior tibial.  Capillary refill is within normal limits.  Edema is not present.       Neurologic Exam     Both lower extremities have normal sensation.  vibration preception     intact bilaterally     Bilateral lower extremity reflexes: ankle absent and negative Babinski.  Nondemonstrable     Monofilament Exam:  intact bilaterally.      Additional information:  Joint position sense intact bilaterally. Bilateral spasm.      SIX MONTH CLASS FINDINGS UPDATE        Skin:         Texture   _  WNL        Color  _   WNL       Temp.    _    WNL              Tugor    _   WNL          Varicosities   _    none        Edema   _     none        Dermatoses  _    xerosis        Hair  _    WNL     Nails  _      1-10    Mycoses   _    yellow/Brown pigmentation      Thickness  _      3-4 mm hypertrophy        Lesions: (Description) _   Hyperkratosis  Location: _  Plantar surface of the right heel and posterior surface of the left heel.                  Number: _  2        CIRCULATION:        D.P. _                           Fermoral _               Popliteal _                                                     P.T. _                 Left  _     absent         Right   _    absent_      Left _      Right _        Left _     Right _       Left   _    absent     Right   _     absent        NEUROLOGIC:        Cutanaous _        Joint Position _                  Babinski _         Left   _    WNL      Right   _    WNL         Left   _    WNL     Right    _     WNL                Left   _    negative        Right   _    negative        Ankle _              Vibratory _                        Monofliament _        Left    _   absent           Right  _     absent            Left   _       intact          Right  _      intact              Left  _    WNL          Right    _    WNL            MUSCLE:        Weakness.    _    5/5              Spasm.  _  bilateral                     Clonis.  _      none         PCP visit: 03/17/2010 08/18/2010 03/22/2011 08/2011 09/23/2011 12/2011 09/30/2012 10/10/2012 03/29/2013 11/23/2013 05/2014 05/24/2014 12/29/2014 12/2015 07/2016 03/02/2017        Bernadette Hoit M.D.        PQRS Data Collection Neuropathy        Diabetic Foot and Ankle Care Peripheral Neuropathy  Neurological Evaluation            Lower Extremity Neurological Exam        Motor     Sensory:  reflexes absent_ vibratory_WNL  proprioception WNL_ sharp/dullWNL _ monofilament detection_WNL        Performed  Yes _x No _    Not Performed  Yes _ No _       Reason Not Performed:                Musculoskeletal Exam     Bilaterally the lower extremities have normal strength and 5/5 no clonis bilateral spasm..  Bilaterally Bunion Bunionette.  Gait Assessment::  Apropulsive.           Impression and Plan     Diagnosis       Arteriosclerosis of the Extremities (ICD10-CM I70.209).       NIDDM with Peripheral Circulatory Disorder (ICD10-CM E11.51).       Insulin long-term use (ICD10-CM Z79.4).        Foot pain right (ICD10-CM M79.671).       Foot pain left (ICD10-CM M79.672).       Onychomycosis due to dermatophyte (ICD10-CM B35.1).       Course:  Well controlled.      Plan:  Roselee will be followed every three months..           Procedure: Manual debridement Rotobur reduction.      Follow-up:  In  3  months.      Summary:  Recent slip on to floor. X-ray report reviewed. Reported left distal Malleolat fraxture plantar Calcaneal spur calcification of the Pedal Arteries Sub-Talar DJD. Anterior Talo-Fibular Ligament is tender to palpation..      Orders       Orders     Requests (Return to Office):    Return to Clinic (Request) (Order): Return in 3 months    Charges:    [phone] lower extremity neurological exam performed and documented [employer]) (Order): Quantity: 1 NIDDM with Peripheral Circulatory Disorder    11720 debridement of nail(s) by any method(s); 1 to 5 [employer]) (Order): Quantity: 1 Onychomycosis due to dermatophyte  Foot pain left  Foot pain right  NIDDM with Peripheral Circulatory Disorder.           Signed and Authored by Elvina Mattes on 03/03/2017 01:18 PM EST

## 2017-03-10 DIAGNOSIS — G2582 Stiff-man syndrome: Secondary | ICD-10-CM | POA: Diagnosis not present

## 2017-03-15 HISTORY — PX: BREAST BIOPSY: SHX20

## 2017-03-16 DIAGNOSIS — G4733 Obstructive sleep apnea (adult) (pediatric): Secondary | ICD-10-CM | POA: Diagnosis not present

## 2017-03-16 DIAGNOSIS — G2582 Stiff-man syndrome: Secondary | ICD-10-CM | POA: Diagnosis not present

## 2017-03-24 DIAGNOSIS — G2582 Stiff-man syndrome: Secondary | ICD-10-CM | POA: Diagnosis not present

## 2017-03-25 DIAGNOSIS — Z9989 Dependence on other enabling machines and devices: Secondary | ICD-10-CM | POA: Diagnosis not present

## 2017-03-25 DIAGNOSIS — G4733 Obstructive sleep apnea (adult) (pediatric): Secondary | ICD-10-CM | POA: Diagnosis not present

## 2017-03-29 DIAGNOSIS — Z9181 History of falling: Secondary | ICD-10-CM | POA: Diagnosis not present

## 2017-03-29 DIAGNOSIS — G2582 Stiff-man syndrome: Secondary | ICD-10-CM | POA: Diagnosis not present

## 2017-04-05 DIAGNOSIS — Z9181 History of falling: Secondary | ICD-10-CM | POA: Diagnosis not present

## 2017-04-05 DIAGNOSIS — G2582 Stiff-man syndrome: Secondary | ICD-10-CM | POA: Diagnosis not present

## 2017-04-07 DIAGNOSIS — L821 Other seborrheic keratosis: Secondary | ICD-10-CM | POA: Diagnosis not present

## 2017-04-07 DIAGNOSIS — G2582 Stiff-man syndrome: Secondary | ICD-10-CM | POA: Diagnosis not present

## 2017-04-07 DIAGNOSIS — Z9181 History of falling: Secondary | ICD-10-CM | POA: Diagnosis not present

## 2017-04-11 DIAGNOSIS — Z9181 History of falling: Secondary | ICD-10-CM | POA: Diagnosis not present

## 2017-04-11 DIAGNOSIS — G2582 Stiff-man syndrome: Secondary | ICD-10-CM | POA: Diagnosis not present

## 2017-04-14 DIAGNOSIS — G2582 Stiff-man syndrome: Secondary | ICD-10-CM | POA: Diagnosis not present

## 2017-04-14 DIAGNOSIS — Z9181 History of falling: Secondary | ICD-10-CM | POA: Diagnosis not present

## 2017-04-18 DIAGNOSIS — G2582 Stiff-man syndrome: Secondary | ICD-10-CM | POA: Diagnosis not present

## 2017-04-18 DIAGNOSIS — Z9181 History of falling: Secondary | ICD-10-CM | POA: Diagnosis not present

## 2017-04-21 DIAGNOSIS — Z9181 History of falling: Secondary | ICD-10-CM | POA: Diagnosis not present

## 2017-04-21 DIAGNOSIS — G2582 Stiff-man syndrome: Secondary | ICD-10-CM | POA: Diagnosis not present

## 2017-04-29 DIAGNOSIS — Z9181 History of falling: Secondary | ICD-10-CM | POA: Diagnosis not present

## 2017-04-29 DIAGNOSIS — G2582 Stiff-man syndrome: Secondary | ICD-10-CM | POA: Diagnosis not present

## 2017-05-05 DIAGNOSIS — G2582 Stiff-man syndrome: Secondary | ICD-10-CM | POA: Diagnosis not present

## 2017-05-06 DIAGNOSIS — R921 Mammographic calcification found on diagnostic imaging of breast: Secondary | ICD-10-CM | POA: Diagnosis not present

## 2017-05-06 DIAGNOSIS — Z1231 Encounter for screening mammogram for malignant neoplasm of breast: Secondary | ICD-10-CM | POA: Diagnosis not present

## 2017-05-09 DIAGNOSIS — G2582 Stiff-man syndrome: Secondary | ICD-10-CM | POA: Diagnosis not present

## 2017-05-09 DIAGNOSIS — Z9181 History of falling: Secondary | ICD-10-CM | POA: Diagnosis not present

## 2017-05-16 DIAGNOSIS — R928 Other abnormal and inconclusive findings on diagnostic imaging of breast: Secondary | ICD-10-CM | POA: Diagnosis not present

## 2017-05-16 DIAGNOSIS — R921 Mammographic calcification found on diagnostic imaging of breast: Secondary | ICD-10-CM | POA: Diagnosis not present

## 2017-05-20 DIAGNOSIS — G2582 Stiff-man syndrome: Secondary | ICD-10-CM | POA: Diagnosis not present

## 2017-05-20 DIAGNOSIS — L821 Other seborrheic keratosis: Secondary | ICD-10-CM | POA: Diagnosis not present

## 2017-06-02 DIAGNOSIS — E109 Type 1 diabetes mellitus without complications: Secondary | ICD-10-CM | POA: Diagnosis not present

## 2017-06-02 DIAGNOSIS — G2582 Stiff-man syndrome: Secondary | ICD-10-CM | POA: Diagnosis not present

## 2017-06-07 ENCOUNTER — Ambulatory Visit: Admitting: Podiatrist

## 2017-06-07 DIAGNOSIS — M79671 Pain in right foot: Secondary | ICD-10-CM | POA: Diagnosis not present

## 2017-06-07 DIAGNOSIS — E1151 Type 2 diabetes mellitus with diabetic peripheral angiopathy without gangrene: Secondary | ICD-10-CM | POA: Diagnosis not present

## 2017-06-07 DIAGNOSIS — M79672 Pain in left foot: Secondary | ICD-10-CM | POA: Diagnosis not present

## 2017-06-07 DIAGNOSIS — B351 Tinea unguium: Secondary | ICD-10-CM | POA: Diagnosis not present

## 2017-06-09 DIAGNOSIS — E109 Type 1 diabetes mellitus without complications: Secondary | ICD-10-CM | POA: Diagnosis not present

## 2017-06-09 DIAGNOSIS — E785 Hyperlipidemia, unspecified: Secondary | ICD-10-CM | POA: Diagnosis not present

## 2017-06-09 DIAGNOSIS — G4733 Obstructive sleep apnea (adult) (pediatric): Secondary | ICD-10-CM | POA: Diagnosis not present

## 2017-06-09 NOTE — Discharge Summary (Signed)
Visit Summary for Paula Mcmillan  We would like to thank you for allowing [place] to assist you with your healthcare needs. Our entire staff strives to provide an    excellent experience for our patients and their families. The following includes information regarding your visit.        Age: 67 yearsSex: FemaleDOB: [birth year]MRN: [id number]    Address: [address] [place] [address]    Home: [phone]: --Mobile: --    Primary Care Provider: Teddy Spike    Race: WhiteEthnicity: Not Hispanic or Latino    Preferred Language: English    Health Plan: Part B Claims [employer] [facility]         Visit Information    [facility]    [address] [place] [address]    Phone: [phone]: [phone]        Visit Date: 03/26/201909:30 am    Scheduled Provider: [facility]    Visit Provider: Kathalene Frames    Referring Provider: Teddy Spike    Reason for Visit: dfc            Future Appointments    Appt. 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)            Allergies    NSAIDs    aspirinReactions: Meningitis            All Known Current Prescriptions and Reported Medications    Prescriptions    No medications documented        Other Reported Medications    Advil (ibuprofen)    oralevery 6 hours    atorvastatin 20 mg oral tablet (atorvastatin)    Take 1tab(s)(20Milligram)oraldaily    CellCept (mycophenolate mofetil)    Take 500Milligram2 times a  day    clobetasol 0.05% topical gel (clobetasol topical)    Take 1apptopical2 times a day    diazePAM 10 mg oral tablet (diazePAM)    Take 1tab(s)(10Milligram)oral3 times a dayfor anxiety    Flovent HFA 220 mcg/inh inhalation aerosol (fluticasone)    Take 2puff(s)inhalation2 times a day    Lubricant Eye Drops (ocular lubricant)    both eyes2 times a day    mycophenolate mofetil 500 mg oral tablet (mycophenolate mofetil)    Take 2tab(s)(1000Milligram)oral2 times a day    NovoLOG (insulin aspart)    subcutaneous3 times a day before meals    oxybutynin 5 mg oral tablet (oxybutynin)    Take 1tab(s)(5Milligram)oral2 times a dayfor urinary discomfort    Privigen (immune globulin intravenous)    Take 300Milligrams/Kilogramintravenousevery other week    ProAir HFA (albuterol)    inhalation4 times a day    sertraline 100 mg oral tablet (sertraline)    Take 1tab(s)(100Milligram)oraldaily    tiZANidine 2 mg oral capsule (tiZANidine)    Take 2cap(4Milligram)oralevery 8 hours  Tylenol Extra Strength (acetaminophen)    Take 6 hours    Valium (diazepam)    Take 5Milligram2 times a day    Ventolin HFA (albuterol)    inhalation4 times a day    Vitamin D (ergocalciferol)    oral            New Medications this Visit    New Prescriptions    No medications documented        Medications Administered During Your Visit    No medications documented            Vitals and Measurements this Visit (last charted value for your 06/07/2017 visit)    No vitals and measurements documented            Laboratory and Radiology this Visit (last charted value for your 06/07/2017 visit)    No Laboratory and Radiology documented            Orders this Visit    Requests (Return to Office)    Return to ClinicRequested Date: 06/07/2017    Return in 3 months            Diagnoses this Visit    Foot pain left (M79.672)    Foot pain right (M79.671)    Insulin long-term use (Z79.4)    NIDDM with Peripheral Circulatory Disorder  (E11.51)    Onychomycosis due to dermatophyte (B35.1)            Referral and Consult Requests this Visit    No Referral or Consults documented            Procedures Documented this Visit        Debridementofnail(s)byanymethod(s);1to5            Smoking Status    Neversmoker                Patient Education Materials Provided this Visit    No Patient Education documented                Additional Visit Comments:

## 2017-06-09 NOTE — Initial Assessments (Signed)
Quick Intake Entered On:  06/07/2017 9:16 AM EDT      Performed On:  06/07/2017 9:16 AM EDT by Manual Meier                         Summary     Chief Complaint :   dfc     Race :   Caucasian     Languages :   English     Ethnicity :   Not Hispanic or Latino     Manual Meier - 06/07/2017 9:16 AM EDT

## 2017-06-09 NOTE — Progress Notes (Signed)
Patient:   Paula Mcmillan            MRN: [id number]            FIN: [id number]                 Age:   67 years     Sex:  Female     DOB:  1950/07/29     Associated Diagnoses:   NIDDM with Peripheral Circulatory Disorder; Insulin long-term use; Foot pain right; Foot pain left; Onychomycosis due to dermatophyte     Author:   Kathalene Frames          Visit Information          Date of Service: 06/07/2017 07:51 am  Performing Location: [facility]  Encounter#: [id number]          Referring Provider:  Teddy Spike     Visit type:  Diabetic foot care Established patient Scheduled follow-up.      Accompanied by:  No one.      Source of history:  Self.      Referral source:  Self.      History limitation:  None.           Chief Complaint     Insulin-dependent diabetic with onychomycosis 1-10. 3-4 mm hypertrophy. Yellow/Brown pigmentation. Ingrowing and painful. Limit ambulation.The left hallux is developing a hyperkeratosis along the lateral groove due to insult from the nail plate.    Nucleated hyperkeratosis of the right heel.    Posterior hyperkeratosis of the left heel.     06/07/2017 9:16 AM EDT    dfc            History of Present Illness          The patient presents with onychomycosis and     Nucleated hyperkeratoses of the plantar surface of the right heel and hyperkeratoses of the posterior surface of the left heel..       Stiff person syndrome.    Labile insulin-dependent diabetes.          Review of Systems     Constitutional:  Negative.      Eye:  Negative.      Ear/Nose/Mouth/Throat:  Negative.      Respiratory:  Negative.      Cardiovascular:  Negative.      Gastrointestinal:  Negative.      Genitourinary:  Negative.      Hematology/Lymphatics:  Negative.      Endocrine:  Negative.      Immunologic:  Negative.      Musculoskeletal:  Negative.      Integumentary:  Negative.      Neurologic:  Negative.      Psychiatric:  Negative.      All otther ROS were reviewed  and are negative          Health Status     Allergies:      Allergic Reactions (Selected)    Severity Not Documented    NSAIDs (No reactions were documented)    Nonallergic Reactions (Selected)    Severity Not Documented    Aspirin (Meningitis)     Medications:  (Selected)     Documented Medications    Documented    Advil: po q6 hrs 0 Refill(s) Type: Maintenance    CellCept: ( 500 mg ) bid mL 0 Refill(s) Type: Maintenance    Flovent HFA 220 mcg/inh inhalation aerosol: 2 puff(s) inh bid 0  Refill(s) Type: Maintenance    Lubricant Eye Drops: both eyes bid 0 Refill(s) Type: Maintenance    NovoLOG: Subcutaneous tidac 0 Refill(s) Type: Maintenance    NovoLOG: subcutaneous tidac 0 Refill(s) Type: Maintenance    Privigen: ( 300 mg/kg ) iv q2 wks 0 Refill(s) Type: Maintenance    ProAir HFA: inh qid 0 Refill(s) Type: Maintenance    Tylenol Extra Strength: ( 1000 mg ) po q6 hrs 0 Refill(s) Type: Maintenance    Valium: ( 5 mg ) bid tab(s) 0 Refill(s) Type: Maintenance    Ventolin HFA: inh qid 0 Refill(s) Type: Maintenance    Vitamin D: po mL 0 Refill(s) Type: Maintenance    atorvastatin 20 mg oral tablet: 1 tab(s) ( 20 mg ) po daily 0 Refill(s) Type: Maintenance    clobetasol 0.05% topical gel: 1 app top bid 0 Refill(s) Type: Maintenance    diazePAM 10 mg oral tablet: 1 tab(s) ( 10 mg ) PO TID PRN: for anxiety 0 Refill(s) Type: Maintenance    mycophenolate mofetil 500 mg oral tablet: 2 tab(s) ( 1000 mg ) po bid 0 Refill(s) Type: Maintenance    oxybutynin 5 mg oral tablet: 1 tab(s) ( 5 mg ) PO bid PRN: for urinary discomfort # 30 tab(s) 0 Refill(s) Type: Maintenance    sertraline 100 mg oral tablet: 1 tab(s) ( 100 mg ) po daily 0 Refill(s) Type: Maintenance    tiZANidine 2 mg oral capsule: 2 cap(s) ( 4 mg ) po q8 hrs 0 Refill(s) Type: Maintenance     Problem list:      All Problems    Asthma / ICD-9-CM 493.90 / Confirmed    Back pain low back / ICD-9-CM 724.2 / Confirmed    Carpal Tunnel Syndrome / ICD-9-CM 354.0 /  Confirmed    Diabetes / ICD-9-CM 250.00 / Confirmed    GERD (Gastroesophageal Reflux Disease) / ICD-9-CM 530.81 / Confirmed    High Cholesterol / ICD-9-CM 272.0 / Confirmed    Hypoglycemia / SNOMED CT [id number] / Confirmed    Neck pain / ICD-9-CM 723.1 / Confirmed    Obesity / ICD-9-CM 278.00 / Probable    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 / Confirmed    Sleep apnea / SNOMED CT 664403474 / Confirmed    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    Resolved: Fibromyalgia / ICD-9-CM 729.1    Resolved: HEADACHE / ICD-9-CM 784.0    Resolved: Pulmonary embolism / SNOMED CT 25956387          Histories     Past Medical History:      Active    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    HEADACHE (784.0):  Resolved.    Pulmonary embolism (62376283):  Resolved.    Comments:    10/24/2013 EDT 10:26 AM EDT - Barrie Folk    left     Family History:      Alzheimer's Disease    Father (Deceased)    Aneurysm  Grandmother (M) (Deceased)    Grandmother (P) (Deceased)    Grandfather (P) (Deceased)    Grandfather (M) (Deceased)    Alcohol user    Father (Deceased)    Cancer - unknown origin    Mother (Deceased)    Tobacco user    Father (Deceased)         Procedure history:      Tonsil and adenoid structure (161096045).    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    Colonoscopy (478295621).    Hysterectomy (308657846).    Carpal tunnel release (962952841).    Ethmoid sinus (32440102).    Arthroscopy of ankle (725366440).    Central line insertion (347425956).      Social History:          Alcohol Assessment: Denies Alcohol Use              Never        Tobacco Assessment: Denies Tobacco Use              Never        Substance Abuse Assessment: Denies Substance Abuse          Dermatologic Exam     Bilateral lower extremeties: warm normal elasticity normal turgor no lesions normal hair growth xerosis.  Findings specific to bilateral toenails: hallux second toes third toes fourth toes fifth toes yellow thickened brittle painful with palpation hypertrophic subungual debris.  Findings specific to the right heel: plantar aspect This Nucleated hyperkeratoses..  Findings specific to the left heel: Posterior hyperkeratoses..  Findings specific to the left toes: medial aspect hallux hyperkeratotic tissue.       Vascular Exam     Arterial pulses are absent for the left dorsalis pedis.  Arterial pulses are absent for the right dorsalis pedis.  Arterial pulses are absent for the left posterior tibial.  Arterial pulses are absent for the right posterior tibial.  Capillary refill is within normal limits.  Edema is not present.       Neurologic Exam     Both lower extremities have normal sensation.  vibration preception     intact bilaterally     Bilateral lower extremity reflexes: ankle absent and negative Babinski.  Nondemonstrable     Monofilament Exam:  intact bilaterally.      Additional information:  Joint position sense intact bilaterally. Bilateral spasm.      SIX MONTH CLASS FINDINGS UPDATE        Skin:         Texture   _  WNL        Color  _   WNL       Temp.    _    WNL              Tugor    _   WNL          Varicosities   _    none        Edema   _     none        Dermatoses  _    xerosis        Hair  _    WNL     Nails  _      1-10    Mycoses   _    yellow/Brown pigmentation      Thickness  _      3-4 mm hypertrophy        Lesions: (Description) _  Hyperkratosis                  Location: _  Plantar surface of the right heel and posterior surface of the left heel.                   Number: _ 2        CIRCULATION:        D.P. _                           Fermoral _               Popliteal _                                                     P.T. _                 Left  _     absent         Right   _    absent_      Left _      Right _        Left _     Right _       Left   _    absent     Right   _     absent        NEUROLOGIC:        Cutanaous _        Joint Position _                  Babinski _         Left   _    WNL      Right   _    WNL         Left   _    WNL     Right    _     WNL                Left   _    negative        Right   _    negative        Ankle _              Vibratory _                        Monofliament _        Left    _   absent           Right  _     absent            Left   _       intact          Right  _      intact              Left  _    WNL          Right    _    WNL            MUSCLE:        Weakness.    _    5/5  Spasm.  _      bilateral                     Clonis.  _      none         PCP visit: 03/17/2010 08/18/2010 03/22/2011 08/2011 09/23/2011 12/2011 09/30/2012 10/10/2012 03/29/2013 11/23/2013 05/2014 05/24/2014 12/29/2014 12/2015 07/2016 03/02/2017        Bernadette Hoit M.D.        PQRS Data Collection Neuropathy        Diabetic Foot and Ankle Care Peripheral Neuropathy  Neurological Evaluation            Lower Extremity Neurological Exam        Motor     Sensory:  reflexes absent_ vibratory_WNL  proprioception WNL_ sharp/dullWNL _ monofilament detection_WNL        Performed  Yes _x No _    Not Performed  Yes _ No _       Reason Not Performed:                Musculoskeletal Exam     Bilaterally the lower extremities have normal strength and 5/5 no clonis bilateral spasm..  Bilaterally Bunion Bunionette.  Gait Assessment::  Apropulsive.           Impression and Plan     Diagnosis       NIDDM with Peripheral Circulatory Disorder (ICD10-CM E11.51).       Insulin long-term use (ICD10-CM Z79.4).       Foot pain right (ICD10-CM M79.671).        Foot pain left (ICD10-CM M79.672).       Onychomycosis due to dermatophyte (ICD10-CM B35.1).       Course:  Well controlled.      Plan:  Serra will be followed every three months..           Procedure: Manual debridement Rotobur reduction.      Follow-up:  In  3  months.      Summary:  Last Hb A1 C was 8.0.      Orders       Orders     Requests (Return to Office):    Return to Clinic (Request) (Order): Return in 3 months    Charges:    [phone] lower extremity neurological exam performed and documented [employer]) (Order): Quantity: 1 NIDDM with Peripheral Circulatory Disorder    11720 debridement of nail(s) by any method(s); 1 to 5 [employer]) (Order): Quantity: 1 Onychomycosis due to dermatophyte  Foot pain left  Foot pain right  NIDDM with Peripheral Circulatory Disorder.           Signed and Authored by Elvina Mattes on 06/07/2017 09:33 AM EDT

## 2017-06-17 DIAGNOSIS — G2582 Stiff-man syndrome: Secondary | ICD-10-CM | POA: Diagnosis not present

## 2017-07-01 DIAGNOSIS — G2582 Stiff-man syndrome: Secondary | ICD-10-CM | POA: Diagnosis not present

## 2017-07-01 DIAGNOSIS — L821 Other seborrheic keratosis: Secondary | ICD-10-CM | POA: Diagnosis not present

## 2017-07-15 DIAGNOSIS — L821 Other seborrheic keratosis: Secondary | ICD-10-CM | POA: Diagnosis not present

## 2017-07-15 DIAGNOSIS — G2582 Stiff-man syndrome: Secondary | ICD-10-CM | POA: Diagnosis not present

## 2017-07-21 DIAGNOSIS — Z23 Encounter for immunization: Secondary | ICD-10-CM | POA: Diagnosis not present

## 2017-07-21 DIAGNOSIS — F329 Major depressive disorder, single episode, unspecified: Secondary | ICD-10-CM | POA: Diagnosis not present

## 2017-07-21 DIAGNOSIS — G2582 Stiff-man syndrome: Secondary | ICD-10-CM | POA: Diagnosis not present

## 2017-07-21 DIAGNOSIS — F419 Anxiety disorder, unspecified: Secondary | ICD-10-CM | POA: Diagnosis not present

## 2017-07-29 DIAGNOSIS — L821 Other seborrheic keratosis: Secondary | ICD-10-CM | POA: Diagnosis not present

## 2017-07-29 DIAGNOSIS — G2582 Stiff-man syndrome: Secondary | ICD-10-CM | POA: Diagnosis not present

## 2017-08-02 DIAGNOSIS — L89152 Pressure ulcer of sacral region, stage 2: Secondary | ICD-10-CM | POA: Diagnosis not present

## 2017-08-06 DIAGNOSIS — L89151 Pressure ulcer of sacral region, stage 1: Secondary | ICD-10-CM | POA: Diagnosis not present

## 2017-08-07 DIAGNOSIS — M797 Fibromyalgia: Secondary | ICD-10-CM | POA: Diagnosis not present

## 2017-08-07 DIAGNOSIS — F329 Major depressive disorder, single episode, unspecified: Secondary | ICD-10-CM | POA: Diagnosis not present

## 2017-08-07 DIAGNOSIS — G2582 Stiff-man syndrome: Secondary | ICD-10-CM | POA: Diagnosis not present

## 2017-08-07 DIAGNOSIS — Z9989 Dependence on other enabling machines and devices: Secondary | ICD-10-CM | POA: Diagnosis not present

## 2017-08-07 DIAGNOSIS — J4530 Mild persistent asthma, uncomplicated: Secondary | ICD-10-CM | POA: Diagnosis not present

## 2017-08-07 DIAGNOSIS — E109 Type 1 diabetes mellitus without complications: Secondary | ICD-10-CM | POA: Diagnosis not present

## 2017-08-07 DIAGNOSIS — E213 Hyperparathyroidism, unspecified: Secondary | ICD-10-CM | POA: Diagnosis not present

## 2017-08-07 DIAGNOSIS — G4733 Obstructive sleep apnea (adult) (pediatric): Secondary | ICD-10-CM | POA: Diagnosis not present

## 2017-08-07 DIAGNOSIS — L89322 Pressure ulcer of left buttock, stage 2: Secondary | ICD-10-CM | POA: Diagnosis not present

## 2017-08-07 DIAGNOSIS — G119 Hereditary ataxia, unspecified: Secondary | ICD-10-CM | POA: Diagnosis not present

## 2017-08-09 DIAGNOSIS — G119 Hereditary ataxia, unspecified: Secondary | ICD-10-CM | POA: Diagnosis not present

## 2017-08-09 DIAGNOSIS — M797 Fibromyalgia: Secondary | ICD-10-CM | POA: Diagnosis not present

## 2017-08-09 DIAGNOSIS — E109 Type 1 diabetes mellitus without complications: Secondary | ICD-10-CM | POA: Diagnosis not present

## 2017-08-09 DIAGNOSIS — L89322 Pressure ulcer of left buttock, stage 2: Secondary | ICD-10-CM | POA: Diagnosis not present

## 2017-08-09 DIAGNOSIS — G2582 Stiff-man syndrome: Secondary | ICD-10-CM | POA: Diagnosis not present

## 2017-08-09 DIAGNOSIS — J4530 Mild persistent asthma, uncomplicated: Secondary | ICD-10-CM | POA: Diagnosis not present

## 2017-08-10 DIAGNOSIS — L89322 Pressure ulcer of left buttock, stage 2: Secondary | ICD-10-CM | POA: Diagnosis not present

## 2017-08-10 DIAGNOSIS — E109 Type 1 diabetes mellitus without complications: Secondary | ICD-10-CM | POA: Diagnosis not present

## 2017-08-10 DIAGNOSIS — J4530 Mild persistent asthma, uncomplicated: Secondary | ICD-10-CM | POA: Diagnosis not present

## 2017-08-10 DIAGNOSIS — G119 Hereditary ataxia, unspecified: Secondary | ICD-10-CM | POA: Diagnosis not present

## 2017-08-10 DIAGNOSIS — G2582 Stiff-man syndrome: Secondary | ICD-10-CM | POA: Diagnosis not present

## 2017-08-10 DIAGNOSIS — M797 Fibromyalgia: Secondary | ICD-10-CM | POA: Diagnosis not present

## 2017-08-12 DIAGNOSIS — M797 Fibromyalgia: Secondary | ICD-10-CM | POA: Diagnosis not present

## 2017-08-12 DIAGNOSIS — J4530 Mild persistent asthma, uncomplicated: Secondary | ICD-10-CM | POA: Diagnosis not present

## 2017-08-12 DIAGNOSIS — G2582 Stiff-man syndrome: Secondary | ICD-10-CM | POA: Diagnosis not present

## 2017-08-12 DIAGNOSIS — G119 Hereditary ataxia, unspecified: Secondary | ICD-10-CM | POA: Diagnosis not present

## 2017-08-12 DIAGNOSIS — L89322 Pressure ulcer of left buttock, stage 2: Secondary | ICD-10-CM | POA: Diagnosis not present

## 2017-08-12 DIAGNOSIS — E109 Type 1 diabetes mellitus without complications: Secondary | ICD-10-CM | POA: Diagnosis not present

## 2017-08-22 DIAGNOSIS — L89322 Pressure ulcer of left buttock, stage 2: Secondary | ICD-10-CM | POA: Diagnosis not present

## 2017-08-22 DIAGNOSIS — G119 Hereditary ataxia, unspecified: Secondary | ICD-10-CM | POA: Diagnosis not present

## 2017-08-22 DIAGNOSIS — J4530 Mild persistent asthma, uncomplicated: Secondary | ICD-10-CM | POA: Diagnosis not present

## 2017-08-22 DIAGNOSIS — G2582 Stiff-man syndrome: Secondary | ICD-10-CM | POA: Diagnosis not present

## 2017-08-22 DIAGNOSIS — E109 Type 1 diabetes mellitus without complications: Secondary | ICD-10-CM | POA: Diagnosis not present

## 2017-08-22 DIAGNOSIS — M797 Fibromyalgia: Secondary | ICD-10-CM | POA: Diagnosis not present

## 2017-08-26 DIAGNOSIS — L821 Other seborrheic keratosis: Secondary | ICD-10-CM | POA: Diagnosis not present

## 2017-08-26 DIAGNOSIS — G2582 Stiff-man syndrome: Secondary | ICD-10-CM | POA: Diagnosis not present

## 2017-09-02 DIAGNOSIS — G2582 Stiff-man syndrome: Secondary | ICD-10-CM | POA: Diagnosis not present

## 2017-09-02 DIAGNOSIS — Z9989 Dependence on other enabling machines and devices: Secondary | ICD-10-CM | POA: Diagnosis not present

## 2017-09-02 DIAGNOSIS — J4530 Mild persistent asthma, uncomplicated: Secondary | ICD-10-CM | POA: Diagnosis not present

## 2017-09-02 DIAGNOSIS — G4733 Obstructive sleep apnea (adult) (pediatric): Secondary | ICD-10-CM | POA: Diagnosis not present

## 2017-09-02 DIAGNOSIS — E213 Hyperparathyroidism, unspecified: Secondary | ICD-10-CM | POA: Diagnosis not present

## 2017-09-02 DIAGNOSIS — G119 Hereditary ataxia, unspecified: Secondary | ICD-10-CM | POA: Diagnosis not present

## 2017-09-02 DIAGNOSIS — F329 Major depressive disorder, single episode, unspecified: Secondary | ICD-10-CM | POA: Diagnosis not present

## 2017-09-02 DIAGNOSIS — M797 Fibromyalgia: Secondary | ICD-10-CM | POA: Diagnosis not present

## 2017-09-02 DIAGNOSIS — L89322 Pressure ulcer of left buttock, stage 2: Secondary | ICD-10-CM | POA: Diagnosis not present

## 2017-09-02 DIAGNOSIS — E109 Type 1 diabetes mellitus without complications: Secondary | ICD-10-CM | POA: Diagnosis not present

## 2017-09-05 DIAGNOSIS — G2582 Stiff-man syndrome: Secondary | ICD-10-CM | POA: Diagnosis not present

## 2017-09-05 DIAGNOSIS — G4733 Obstructive sleep apnea (adult) (pediatric): Secondary | ICD-10-CM | POA: Diagnosis not present

## 2017-09-06 ENCOUNTER — Ambulatory Visit: Admitting: Podiatrist

## 2017-09-06 DIAGNOSIS — M79672 Pain in left foot: Secondary | ICD-10-CM | POA: Diagnosis not present

## 2017-09-06 DIAGNOSIS — B351 Tinea unguium: Secondary | ICD-10-CM | POA: Diagnosis not present

## 2017-09-06 DIAGNOSIS — M79671 Pain in right foot: Secondary | ICD-10-CM | POA: Diagnosis not present

## 2017-09-06 DIAGNOSIS — E1151 Type 2 diabetes mellitus with diabetic peripheral angiopathy without gangrene: Secondary | ICD-10-CM | POA: Diagnosis not present

## 2017-09-08 DIAGNOSIS — G4733 Obstructive sleep apnea (adult) (pediatric): Secondary | ICD-10-CM | POA: Diagnosis not present

## 2017-09-08 DIAGNOSIS — J4530 Mild persistent asthma, uncomplicated: Secondary | ICD-10-CM | POA: Diagnosis not present

## 2017-09-08 DIAGNOSIS — Z9989 Dependence on other enabling machines and devices: Secondary | ICD-10-CM | POA: Diagnosis not present

## 2017-09-08 NOTE — Discharge Summary (Signed)
Visit Summary for Paula Mcmillan  We would like to thank you for allowing [place] to assist you with your healthcare needs. Our entire staff strives to provide an    excellent experience for our patients and their families. The following includes information regarding your visit.        Age: 67 yearsSex: FemaleDOB: 03-15-1952MRN: [id number]    Address: [address] [place] [address]    Home: [phone]: --Mobile: --    Primary Care Provider: Teddy Spike    Race: WhiteEthnicity: Not Hispanic or Latino    Preferred Language: English    Health Plan: Part B Claims [employer]         Visit Information    [facility]    [address] [place] [address]    Phone: [phone]: [phone]        Visit Date: 06/25/201908:30 am    Scheduled Provider: [facility]    Visit Provider: Kathalene Frames    Referring Provider: Teddy Spike    Reason for Visit: dfc            Future Appointments    Appt. 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)            Allergies    NSAIDs    aspirinReactions: Meningitis            All Known Current Prescriptions and Reported Medications    Prescriptions    No medications documented        Other Reported Medications    Advil (ibuprofen)    oralevery 6 hours    atorvastatin 20 mg oral tablet (atorvastatin)    Take 1tab(s)(20Milligram)oraldaily    CellCept (mycophenolate mofetil)    Take 500Milligram2 times a  day    clobetasol 0.05% topical gel (clobetasol topical)    Take 1apptopical2 times a day    diazePAM 10 mg oral tablet (diazePAM)    Take 1tab(s)(10Milligram)oral3 times a dayfor anxiety    Flovent HFA 220 mcg/inh inhalation aerosol (fluticasone)    Take 2puff(s)inhalation2 times a day    Lubricant Eye Drops (ocular lubricant)    both eyes2 times a day    mycophenolate mofetil 500 mg oral tablet (mycophenolate mofetil)    Take 2tab(s)(1000Milligram)oral2 times a day    NovoLOG (insulin aspart)    subcutaneous3 times a day before meals    oxybutynin 5 mg oral tablet (oxybutynin)    Take 1tab(s)(5Milligram)oral2 times a dayfor urinary discomfort    Privigen (immune globulin intravenous)    Take 300Milligrams/Kilogramintravenousevery 2 weeks    ProAir HFA (albuterol)    inhalation4 times a day    sertraline 100 mg oral tablet (sertraline)    Take 1tab(s)(100Milligram)oraldaily    tiZANidine 2 mg oral capsule (tiZANidine)    Take 2cap(4Milligram)oralevery 8 hours  Tylenol Extra Strength (acetaminophen)    Take 6 hours    Valium (diazepam)    Take 5Milligram2 times a day    Ventolin HFA (albuterol)    inhalation4 times a day    Vitamin D (ergocalciferol)    oral            New Medications this Visit    New Prescriptions    No medications documented        Medications Administered During Your Visit    No medications documented            Vitals and Measurements this Visit (last charted value for your 09/06/2017 visit)    No vitals and measurements documented            Laboratory and Radiology this Visit (last charted value for your 09/06/2017 visit)    No Laboratory and Radiology documented            Orders this Visit    Requests (Return to Office)    Return to ClinicRequested Date: 09/06/2017    Return in 3 months            Diagnoses this Visit    Foot pain left (M79.672)    Foot pain right (M79.671)    Insulin long-term use (Z79.4)    NIDDM with Peripheral Circulatory Disorder  (E11.51)    Onychomycosis due to dermatophyte (B35.1)            Referral and Consult Requests this Visit    No Referral or Consults documented            Procedures Documented this Visit        Debridementofnail(s)byanymethod(s);1to5            Smoking Status    Neversmoker                Patient Education Materials Provided this Visit    No Patient Education documented                Additional Visit Comments:

## 2017-09-08 NOTE — Progress Notes (Signed)
Patient:   Paula Mcmillan            MRN: [id number]            FIN: [phone] BE2                 Age:   67 years     Sex:  Female     DOB:  Aug 25, 1950     Associated Diagnoses:   NIDDM with Peripheral Circulatory Disorder; Insulin long-term use; Foot pain right; Foot pain left; Onychomycosis due to dermatophyte     Author:   Kathalene Frames          Visit Information          Date of Service: 09/06/2017 07:11 am  Performing Location: [facility]  Encounter#: [phone] [id number]          Referring Provider:  Teddy Spike     Visit type:  Diabetic foot care Established patient Scheduled follow-up.      Accompanied by:  No one.      Source of history:  Self.      Referral source:  Self.      History limitation:  None.           Chief Complaint     Insulin-dependent diabetic with onychomycosis 1-10. 3-4 mm hypertrophy. Yellow/Brown pigmentation. Ingrowing and painful. Limit ambulation.The left hallux is developing a hyperkeratosis along the lateral groove due to insult from the nail plate.    Nucleated hyperkeratosis of the right heel.    Posterior hyperkeratosis of the left heel.     09/06/2017 8:13 AM EDT    dfc            History of Present Illness          The patient presents with onychomycosis and     Nucleated hyperkeratoses of the plantar surface of the right heel and hyperkeratoses of the posterior surface of the left heel..       Stiff person syndrome.    Labile insulin-dependent diabetes.          Review of Systems     Constitutional:  Negative.      Eye:  Negative.      Ear/Nose/Mouth/Throat:  Negative.      Respiratory:  Negative.      Cardiovascular:  Negative.      Gastrointestinal:  Negative.      Genitourinary:  Negative.      Hematology/Lymphatics:  Negative.      Endocrine:  Negative.      Immunologic:  Negative.      Musculoskeletal:  Negative.      Integumentary:  Negative.      Neurologic:  Negative.      Psychiatric:  Negative.      All otther ROS were reviewed  and are negative          Health Status     Allergies:      Allergic Reactions (Selected)    Severity Not Documented    NSAIDs (No reactions were documented)    Nonallergic Reactions (Selected)    Severity Not Documented    Aspirin (Meningitis)     Medications:  (Selected)     Documented Medications    Documented    Advil: po q6 hrs 0 Refill(s) Type: Maintenance    CellCept: ( 500 mg ) bid mL 0 Refill(s) Type: Maintenance    Flovent HFA 220 mcg/inh inhalation aerosol: 2 puff(s) inh bid 0  Refill(s) Type: Maintenance    Lubricant Eye Drops: both eyes bid 0 Refill(s) Type: Maintenance    NovoLOG: Subcutaneous tidac 0 Refill(s) Type: Maintenance    NovoLOG: subcutaneous tidac 0 Refill(s) Type: Maintenance    Privigen: ( 300 mg/kg ) iv q2 wks 0 Refill(s) Type: Maintenance    ProAir HFA: inh qid 0 Refill(s) Type: Maintenance    Tylenol Extra Strength: ( 1000 mg ) po q6 hrs 0 Refill(s) Type: Maintenance    Valium: ( 5 mg ) bid tab(s) 0 Refill(s) Type: Maintenance    Ventolin HFA: inh qid 0 Refill(s) Type: Maintenance    Vitamin D: po mL 0 Refill(s) Type: Maintenance    atorvastatin 20 mg oral tablet: 1 tab(s) ( 20 mg ) po daily 0 Refill(s) Type: Maintenance    clobetasol 0.05% topical gel: 1 app top bid 0 Refill(s) Type: Maintenance    diazePAM 10 mg oral tablet: 1 tab(s) ( 10 mg ) PO TID PRN: for anxiety 0 Refill(s) Type: Maintenance    mycophenolate mofetil 500 mg oral tablet: 2 tab(s) ( 1000 mg ) po bid 0 Refill(s) Type: Maintenance    oxybutynin 5 mg oral tablet: 1 tab(s) ( 5 mg ) PO bid PRN: for urinary discomfort # 30 tab(s) 0 Refill(s) Type: Maintenance    sertraline 100 mg oral tablet: 1 tab(s) ( 100 mg ) po daily 0 Refill(s) Type: Maintenance    tiZANidine 2 mg oral capsule: 2 cap(s) ( 4 mg ) po q8 hrs 0 Refill(s) Type: Maintenance     Problem list:      All Problems    Asthma / ICD-9-CM 493.90 / Confirmed    Back pain low back / ICD-9-CM 724.2 / Confirmed    Carpal Tunnel Syndrome / ICD-9-CM 354.0 /  Confirmed    Diabetes / ICD-9-CM 250.00 / Confirmed    GERD (Gastroesophageal Reflux Disease) / ICD-9-CM 530.81 / Confirmed    High Cholesterol / ICD-9-CM 272.0 / Confirmed    Hypoglycemia / SNOMED CT [id number] / Confirmed    Neck pain / ICD-9-CM 723.1 / Confirmed    Obesity / ICD-9-CM 278.00 / Probable    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 / Confirmed    Sleep apnea / SNOMED CT 664403474 / Confirmed    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    Resolved: Fibromyalgia / ICD-9-CM 729.1    Resolved: HEADACHE / ICD-9-CM 784.0    Resolved: Pulmonary embolism / SNOMED CT 25956387          Histories     Past Medical History:      Active    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    HEADACHE (784.0):  Resolved.    Pulmonary embolism (62376283):  Resolved.    Comments:    10/24/2013 EDT 10:26 AM EDT - Barrie Folk    left     Family History:      Alzheimer's Disease    Father (Deceased)    Aneurysm  Grandmother (M) (Deceased)    Grandmother (P) (Deceased)    Grandfather (P) (Deceased)    Grandfather (M) (Deceased)    Alcohol user    Father (Deceased)    Cancer - unknown origin    Mother (Deceased)    Tobacco user    Father (Deceased)         Procedure history:      Tonsil and adenoid structure (272536644).    Gastric bypass operation (03474259).    Colonoscopy (563875643).    Hysterectomy (329518841).    Carpal tunnel release (660630160).    Ethmoid sinus (10932355).    Arthroscopy of ankle (732202542).    Central line insertion (706237628).      Social History:          Alcohol Assessment: Denies Alcohol Use              Never        Tobacco Assessment: Denies Tobacco Use              Never        Substance Abuse Assessment: Denies Substance Abuse          Dermatologic Exam     Bilateral lower extremeties: warm normal elasticity normal turgor no lesions normal hair growth xerosis.  Findings specific to bilateral toenails: hallux second toes third toes fourth toes fifth toes yellow thickened brittle painful with palpation hypertrophic subungual debris.  Findings specific to the right heel: plantar aspect This Nucleated hyperkeratoses..  Findings specific to the left heel: Posterior hyperkeratoses..  Findings specific to the left toes: medial aspect hallux hyperkeratotic tissue.       Vascular Exam     Arterial pulses are absent for the left dorsalis pedis.  Arterial pulses are absent for the right dorsalis pedis.  Arterial pulses are absent for the left posterior tibial.  Arterial pulses are absent for the right posterior tibial.  Capillary refill is within normal limits.  Edema is not present.       Neurologic Exam     Both lower extremities have normal sensation.  vibration preception     intact bilaterally     Bilateral lower extremity reflexes: ankle absent and negative Babinski.  Nondemonstrable     Monofilament Exam:  intact bilaterally.      Additional information:  Joint position sense intact bilaterally. Bilateral spasm.      SIX MONTH CLASS FINDINGS UPDATE        Skin:         Texture   _  WNL        Color  _   WNL       Temp.    _    WNL              Turgor    _   WNL          Varicosities   _    none        Edema   _     none        Dermatoses  _    xerosis        Hair  _    WNL     Nails  _      1-10    Mycoses   _    yellow/Brown pigmentation      Thickness  _      3-4 mm hypertrophy        Lesions: (Description) _  Hyperkeratosis                  Location: _  Plantar surface of the right heel and posterior surface of the left heel.                   Number: _ 2        CIRCULATION:        D.P. _                           Femoral _               Popliteal _                                                     P.T. _                 Left  _     absent         Right   _    absent  _      Left _      Right _        Left _     Right _       Left   _    absent     Right   _     absent        NEUROLOGIC:        Cutaneous _        Joint Position _                  Babinski _         Left   _    WNL      Right   _    WNL         Left   _    WNL     Right    _     WNL                Left   _    negative        Right   _    negative        Ankle _              Vibratory _                        Monofilament _        Left    _   absent           Right  _     absent            Left   _       intact          Right  _      intact              Left  _    WNL          Right    _    WNL            MUSCLE:        Weakness.    _    5/5  Spasm.  _      bilateral                     Clonus.  _      none         PCP visit: 03/17/2010 08/18/2010 03/22/2011 08/2011 09/23/2011 12/2011 09/30/2012 10/10/2012 03/29/2013 11/23/2013 05/2014 05/24/2014 12/29/2014 12/2015 07/2016 03/02/2017 08/02/2017        Bernadette Hoit M.D.        PQRS Data Collection Neuropathy        Diabetic Foot and Ankle Care Peripheral Neuropathy  Neurological Evaluation            Lower Extremity Neurological Exam        Motor     Sensory:  reflexes absent_ vibratory_WNL  proprioception WNL_ sharp/dullWNL _ monofilament detection_WNL        Performed  Yes _x No _    Not Performed  Yes _ No _       Reason Not Performed:                Musculoskeletal Exam     Bilaterally the lower extremities have normal strength and 5/5 no clonis bilateral spasm..  Bilaterally Bunion Bunionette.  Gait Assessment::  Apropulsive.           Impression and Plan     Diagnosis       NIDDM with Peripheral Circulatory Disorder (ICD10-CM E11.51).       Insulin long-term use (ICD10-CM Z79.4).       Foot pain right (ICD10-CM  M79.671).       Foot pain left (ICD10-CM M79.672).       Onychomycosis due to dermatophyte (ICD10-CM B35.1).       Course:  Well controlled.      Plan:  Lakaisha will be followed every three months..           Procedure: Manual debridement Rotobur reduction.      Follow-up:  In  3  months.      Orders       Orders     Requests (Return to Office):    Return to Clinic (Request) (Order): Return in 3 months    Charges:    [phone] lower extremity neurological exam performed and documented [employer]) (Order): Quantity: 1 Onychomycosis due to dermatophyte    11720 debridement of nail(s) by any method(s); 1 to 5 [employer]) (Order): Quantity: 1 Onychomycosis due to dermatophyte  Foot pain right  Foot pain left  NIDDM with Peripheral Circulatory Disorder.           Signed and Authored by Elvina Mattes on 09/06/2017 08:40 AM EDT

## 2017-09-08 NOTE — Initial Assessments (Signed)
Quick Intake Entered On:  09/06/2017 8:13 AM EDT      Performed On:  09/06/2017 8:13 AM EDT by Gerilyn Pilgrim                         Summary     Chief Complaint :   dfc     Race :   Caucasian     Languages :   English     Ethnicity :   Not Hispanic or Latino     Gerilyn Pilgrim - 09/06/2017 8:13 AM EDT

## 2017-09-09 DIAGNOSIS — G2582 Stiff-man syndrome: Secondary | ICD-10-CM | POA: Diagnosis not present

## 2017-09-09 DIAGNOSIS — L821 Other seborrheic keratosis: Secondary | ICD-10-CM | POA: Diagnosis not present

## 2017-09-23 DIAGNOSIS — L821 Other seborrheic keratosis: Secondary | ICD-10-CM | POA: Diagnosis not present

## 2017-09-23 DIAGNOSIS — G2582 Stiff-man syndrome: Secondary | ICD-10-CM | POA: Diagnosis not present

## 2017-10-04 DIAGNOSIS — E109 Type 1 diabetes mellitus without complications: Secondary | ICD-10-CM | POA: Diagnosis not present

## 2017-10-10 DIAGNOSIS — G2582 Stiff-man syndrome: Secondary | ICD-10-CM | POA: Diagnosis not present

## 2017-10-10 DIAGNOSIS — L821 Other seborrheic keratosis: Secondary | ICD-10-CM | POA: Diagnosis not present

## 2017-10-13 DIAGNOSIS — G4733 Obstructive sleep apnea (adult) (pediatric): Secondary | ICD-10-CM | POA: Diagnosis not present

## 2017-10-13 DIAGNOSIS — Z6824 Body mass index (BMI) 24.0-24.9, adult: Secondary | ICD-10-CM | POA: Diagnosis not present

## 2017-10-13 DIAGNOSIS — G2582 Stiff-man syndrome: Secondary | ICD-10-CM | POA: Diagnosis not present

## 2017-10-13 DIAGNOSIS — Z9989 Dependence on other enabling machines and devices: Secondary | ICD-10-CM | POA: Diagnosis not present

## 2017-10-13 DIAGNOSIS — Z9884 Bariatric surgery status: Secondary | ICD-10-CM | POA: Diagnosis not present

## 2017-10-20 DIAGNOSIS — Z9884 Bariatric surgery status: Secondary | ICD-10-CM | POA: Diagnosis not present

## 2017-10-20 DIAGNOSIS — E109 Type 1 diabetes mellitus without complications: Secondary | ICD-10-CM | POA: Diagnosis not present

## 2017-10-24 DIAGNOSIS — L821 Other seborrheic keratosis: Secondary | ICD-10-CM | POA: Diagnosis not present

## 2017-10-24 DIAGNOSIS — G2582 Stiff-man syndrome: Secondary | ICD-10-CM | POA: Diagnosis not present

## 2017-10-24 DIAGNOSIS — Z9884 Bariatric surgery status: Secondary | ICD-10-CM | POA: Diagnosis not present

## 2017-10-24 DIAGNOSIS — Z79899 Other long term (current) drug therapy: Secondary | ICD-10-CM | POA: Diagnosis not present

## 2017-10-24 DIAGNOSIS — E1065 Type 1 diabetes mellitus with hyperglycemia: Secondary | ICD-10-CM | POA: Diagnosis not present

## 2017-11-07 DIAGNOSIS — E109 Type 1 diabetes mellitus without complications: Secondary | ICD-10-CM | POA: Diagnosis not present

## 2017-11-07 DIAGNOSIS — G2582 Stiff-man syndrome: Secondary | ICD-10-CM | POA: Diagnosis not present

## 2017-11-07 DIAGNOSIS — L821 Other seborrheic keratosis: Secondary | ICD-10-CM | POA: Diagnosis not present

## 2017-11-10 DIAGNOSIS — E531 Pyridoxine deficiency: Secondary | ICD-10-CM | POA: Insufficient documentation

## 2017-11-10 DIAGNOSIS — E8809 Other disorders of plasma-protein metabolism, not elsewhere classified: Secondary | ICD-10-CM | POA: Insufficient documentation

## 2017-11-15 DIAGNOSIS — E785 Hyperlipidemia, unspecified: Secondary | ICD-10-CM | POA: Diagnosis not present

## 2017-11-15 DIAGNOSIS — E109 Type 1 diabetes mellitus without complications: Secondary | ICD-10-CM | POA: Diagnosis not present

## 2017-11-18 DIAGNOSIS — R928 Other abnormal and inconclusive findings on diagnostic imaging of breast: Secondary | ICD-10-CM | POA: Diagnosis not present

## 2017-11-18 DIAGNOSIS — R920 Mammographic microcalcification found on diagnostic imaging of breast: Secondary | ICD-10-CM | POA: Diagnosis not present

## 2017-11-18 DIAGNOSIS — R921 Mammographic calcification found on diagnostic imaging of breast: Secondary | ICD-10-CM | POA: Diagnosis not present

## 2017-11-21 DIAGNOSIS — G2582 Stiff-man syndrome: Secondary | ICD-10-CM | POA: Diagnosis not present

## 2017-11-21 DIAGNOSIS — E109 Type 1 diabetes mellitus without complications: Secondary | ICD-10-CM | POA: Diagnosis not present

## 2017-11-21 DIAGNOSIS — L821 Other seborrheic keratosis: Secondary | ICD-10-CM | POA: Diagnosis not present

## 2017-11-21 DIAGNOSIS — R928 Other abnormal and inconclusive findings on diagnostic imaging of breast: Secondary | ICD-10-CM | POA: Diagnosis not present

## 2017-11-23 DIAGNOSIS — G2582 Stiff-man syndrome: Secondary | ICD-10-CM | POA: Diagnosis not present

## 2017-11-23 DIAGNOSIS — G4733 Obstructive sleep apnea (adult) (pediatric): Secondary | ICD-10-CM | POA: Diagnosis not present

## 2017-11-23 DIAGNOSIS — G119 Hereditary ataxia, unspecified: Secondary | ICD-10-CM | POA: Diagnosis not present

## 2017-12-01 ENCOUNTER — Emergency Department (HOSPITAL_COMMUNITY): Payer: Medicare Other

## 2017-12-01 ENCOUNTER — Other Ambulatory Visit (HOSPITAL_COMMUNITY): Payer: Self-pay

## 2017-12-01 ENCOUNTER — Inpatient Hospital Stay (HOSPITAL_COMMUNITY)
Admission: EM | Admit: 2017-12-01 | Discharge: 2017-12-03 | DRG: 638 | Disposition: A | Discharge status: Home Health | Payer: Medicare Other | Attending: Family Medicine | Admitting: Family Medicine

## 2017-12-01 ENCOUNTER — Encounter: Payer: Self-pay | Admitting: Emergency Medicine

## 2017-12-01 DIAGNOSIS — G2582 Stiff-man syndrome: Secondary | ICD-10-CM | POA: Diagnosis present

## 2017-12-01 DIAGNOSIS — Z79899 Other long term (current) drug therapy: Secondary | ICD-10-CM | POA: Diagnosis not present

## 2017-12-01 DIAGNOSIS — M797 Fibromyalgia: Secondary | ICD-10-CM | POA: Diagnosis present

## 2017-12-01 DIAGNOSIS — R29898 Other symptoms and signs involving the musculoskeletal system: Secondary | ICD-10-CM | POA: Diagnosis not present

## 2017-12-01 DIAGNOSIS — R918 Other nonspecific abnormal finding of lung field: Secondary | ICD-10-CM | POA: Diagnosis not present

## 2017-12-01 DIAGNOSIS — E1110 Type 2 diabetes mellitus with ketoacidosis without coma: Secondary | ICD-10-CM | POA: Diagnosis present

## 2017-12-01 DIAGNOSIS — E1165 Type 2 diabetes mellitus with hyperglycemia: Secondary | ICD-10-CM | POA: Diagnosis not present

## 2017-12-01 DIAGNOSIS — Z9884 Bariatric surgery status: Secondary | ICD-10-CM | POA: Diagnosis not present

## 2017-12-01 DIAGNOSIS — R531 Weakness: Secondary | ICD-10-CM | POA: Diagnosis not present

## 2017-12-01 DIAGNOSIS — Z791 Long term (current) use of non-steroidal anti-inflammatories (NSAID): Secondary | ICD-10-CM | POA: Diagnosis not present

## 2017-12-01 DIAGNOSIS — F329 Major depressive disorder, single episode, unspecified: Secondary | ICD-10-CM | POA: Diagnosis present

## 2017-12-01 DIAGNOSIS — I959 Hypotension, unspecified: Secondary | ICD-10-CM | POA: Diagnosis not present

## 2017-12-01 DIAGNOSIS — R279 Unspecified lack of coordination: Secondary | ICD-10-CM | POA: Diagnosis not present

## 2017-12-01 DIAGNOSIS — E860 Dehydration: Secondary | ICD-10-CM | POA: Diagnosis present

## 2017-12-01 DIAGNOSIS — IMO0002 Reserved for concepts with insufficient information to code with codable children: Secondary | ICD-10-CM

## 2017-12-01 DIAGNOSIS — I10 Essential (primary) hypertension: Secondary | ICD-10-CM | POA: Diagnosis present

## 2017-12-01 DIAGNOSIS — Y92009 Unspecified place in unspecified non-institutional (private) residence as the place of occurrence of the external cause: Secondary | ICD-10-CM

## 2017-12-01 DIAGNOSIS — I350 Nonrheumatic aortic (valve) stenosis: Secondary | ICD-10-CM | POA: Diagnosis present

## 2017-12-01 DIAGNOSIS — M4800 Spinal stenosis, site unspecified: Secondary | ICD-10-CM | POA: Diagnosis present

## 2017-12-01 DIAGNOSIS — M1990 Unspecified osteoarthritis, unspecified site: Secondary | ICD-10-CM | POA: Diagnosis present

## 2017-12-01 DIAGNOSIS — N179 Acute kidney failure, unspecified: Secondary | ICD-10-CM | POA: Diagnosis present

## 2017-12-01 DIAGNOSIS — R4781 Slurred speech: Secondary | ICD-10-CM | POA: Diagnosis not present

## 2017-12-01 DIAGNOSIS — E876 Hypokalemia: Secondary | ICD-10-CM | POA: Diagnosis present

## 2017-12-01 DIAGNOSIS — E1065 Type 1 diabetes mellitus with hyperglycemia: Secondary | ICD-10-CM

## 2017-12-01 DIAGNOSIS — G119 Hereditary ataxia, unspecified: Secondary | ICD-10-CM

## 2017-12-01 DIAGNOSIS — R55 Syncope and collapse: Secondary | ICD-10-CM

## 2017-12-01 DIAGNOSIS — N3941 Urge incontinence: Secondary | ICD-10-CM | POA: Diagnosis present

## 2017-12-01 DIAGNOSIS — W19XXXA Unspecified fall, initial encounter: Secondary | ICD-10-CM | POA: Diagnosis not present

## 2017-12-01 DIAGNOSIS — E108 Type 1 diabetes mellitus with unspecified complications: Secondary | ICD-10-CM

## 2017-12-01 DIAGNOSIS — W050XXA Fall from non-moving wheelchair, initial encounter: Secondary | ICD-10-CM | POA: Diagnosis present

## 2017-12-01 DIAGNOSIS — E1010 Type 1 diabetes mellitus with ketoacidosis without coma: Principal | ICD-10-CM | POA: Diagnosis present

## 2017-12-01 DIAGNOSIS — Z886 Allergy status to analgesic agent status: Secondary | ICD-10-CM

## 2017-12-01 DIAGNOSIS — R4182 Altered mental status, unspecified: Secondary | ICD-10-CM | POA: Diagnosis not present

## 2017-12-01 DIAGNOSIS — Z743 Need for continuous supervision: Secondary | ICD-10-CM | POA: Diagnosis not present

## 2017-12-01 HISTORY — DX: Type 2 diabetes mellitus without complications: E11.9

## 2017-12-01 HISTORY — DX: Essential (primary) hypertension: I10

## 2017-12-01 LAB — URINALYSIS, ROUTINE W REFLEX MICROSCOPIC
BACTERIA UA: NONE SEEN
Bilirubin Urine: NEGATIVE
Glucose, UA: >=500 mg/dL — AB
Ketones, ur: 80 mg/dL — AB
Leukocytes, UA: NEGATIVE
Nitrite: NEGATIVE
PROTEIN: 30 mg/dL — AB
Specific Gravity, Urine: 1.025 (ref 1.005–1.030)
pH: 5 (ref 5.0–8.0)

## 2017-12-01 LAB — BASIC METABOLIC PANEL
Anion gap: 16 — ABNORMAL HIGH (ref 5–15)
Anion gap: 13 (ref 5–15)
BUN: 18 mg/dL (ref 8–23)
BUN: 17 mg/dL (ref 8–23)
CHLORIDE: 112 mmol/L — AB (ref 98–111)
CHLORIDE: 115 mmol/L — AB (ref 98–111)
CO2: 13 mmol/L — ABNORMAL LOW (ref 22–32)
CO2: 17 mmol/L — ABNORMAL LOW (ref 22–32)
CREATININE: 1.35 mg/dL — AB (ref 0.44–1.00)
CREATININE: 1.07 mg/dL — AB (ref 0.44–1.00)
Calcium: 8.1 mg/dL — ABNORMAL LOW (ref 8.9–10.3)
Calcium: 8.3 mg/dL — ABNORMAL LOW (ref 8.9–10.3)
GFR calc non Af Amer: 53 mL/min — ABNORMAL LOW (ref >60)
GFR, EST AFRICAN AMERICAN: 47 mL/min — AB (ref >60)
GFR, EST NON AFRICAN AMERICAN: 40 mL/min — AB (ref >60)
Glucose, Bld: 174 mg/dL — ABNORMAL HIGH (ref 70–99)
Glucose, Bld: 101 mg/dL — ABNORMAL HIGH (ref 70–99)
POTASSIUM: 3.2 mmol/L — AB (ref 3.5–5.1)
Potassium: 3 mmol/L — ABNORMAL LOW (ref 3.5–5.1)
SODIUM: 141 mmol/L (ref 135–145)
SODIUM: 145 mmol/L (ref 135–145)

## 2017-12-01 LAB — CBG MONITORING, ED
GLUCOSE-CAPILLARY: 437 mg/dL — AB (ref 70–99)
GLUCOSE-CAPILLARY: 332 mg/dL — AB (ref 70–99)
Glucose-Capillary: 468 mg/dL — ABNORMAL HIGH (ref 70–99)
Glucose-Capillary: 410 mg/dL — ABNORMAL HIGH (ref 70–99)
Glucose-Capillary: 348 mg/dL — ABNORMAL HIGH (ref 70–99)

## 2017-12-01 LAB — I-STAT TROPONIN, ED: Troponin i, poc: 0.02 ng/mL (ref 0.00–0.08)

## 2017-12-01 LAB — CBC WITH DIFFERENTIAL/PLATELET
Abs Immature Granulocytes: 0 10*3/uL (ref 0.0–0.1)
Basophils Absolute: 0 10*3/uL (ref 0.0–0.1)
Basophils Relative: 1 %
EOS PCT: 1 %
Eosinophils Absolute: 0.1 10*3/uL (ref 0.0–0.7)
HCT: 40.2 % (ref 36.0–46.0)
HEMOGLOBIN: 13.3 g/dL (ref 12.0–15.0)
IMMATURE GRANULOCYTES: 0 %
LYMPHS ABS: 0.3 10*3/uL — AB (ref 0.7–4.0)
LYMPHS PCT: 5 %
MCH: 29.2 pg (ref 26.0–34.0)
MCHC: 33.1 g/dL (ref 30.0–36.0)
MCV: 88.2 fL (ref 78.0–100.0)
MONOS PCT: 6 %
Monocytes Absolute: 0.3 10*3/uL (ref 0.1–1.0)
Neutro Abs: 5.1 10*3/uL (ref 1.7–7.7)
Neutrophils Relative %: 87 %
Platelets: 231 10*3/uL (ref 150–400)
RBC: 4.56 MIL/uL (ref 3.87–5.11)
RDW: 13.4 % (ref 11.5–15.5)
WBC: 5.8 10*3/uL (ref 4.0–10.5)

## 2017-12-01 LAB — COMPREHENSIVE METABOLIC PANEL
ALBUMIN: 3.9 g/dL (ref 3.5–5.0)
ALK PHOS: 81 U/L (ref 38–126)
ALT: 27 U/L (ref 0–44)
AST: 31 U/L (ref 15–41)
Anion gap: 28 — ABNORMAL HIGH (ref 5–15)
BUN: 23 mg/dL (ref 8–23)
CALCIUM: 9.3 mg/dL (ref 8.9–10.3)
CO2: 9 mmol/L — ABNORMAL LOW (ref 22–32)
CREATININE: 1.64 mg/dL — AB (ref 0.44–1.00)
Chloride: 98 mmol/L (ref 98–111)
GFR calc Af Amer: 37 mL/min — ABNORMAL LOW (ref >60)
GFR calc non Af Amer: 32 mL/min — ABNORMAL LOW (ref >60)
GLUCOSE: 508 mg/dL — AB (ref 70–99)
Potassium: 3.9 mmol/L (ref 3.5–5.1)
Sodium: 135 mmol/L (ref 135–145)
TOTAL PROTEIN: 6.9 g/dL (ref 6.5–8.1)
Total Bilirubin: 1.8 mg/dL — ABNORMAL HIGH (ref 0.3–1.2)

## 2017-12-01 LAB — GLUCOSE, CAPILLARY
GLUCOSE-CAPILLARY: 284 mg/dL — AB (ref 70–99)
GLUCOSE-CAPILLARY: 124 mg/dL — AB (ref 70–99)
Glucose-Capillary: 187 mg/dL — ABNORMAL HIGH (ref 70–99)
Glucose-Capillary: 84 mg/dL (ref 70–99)
Glucose-Capillary: 87 mg/dL (ref 70–99)

## 2017-12-01 LAB — BETA-HYDROXYBUTYRIC ACID

## 2017-12-01 LAB — CK: CK TOTAL: 186 U/L (ref 38–234)

## 2017-12-01 MED ORDER — LORAZEPAM 1 MG PO TABS: 1.0000 mg | ORAL_TABLET | Freq: Every day | ORAL | Status: DC

## 2017-12-01 MED ORDER — DEXTROSE-NACL 5-0.45 % IV SOLN: INTRAVENOUS | Status: DC

## 2017-12-01 MED ORDER — SODIUM CHLORIDE 0.9 % IV SOLN
INTRAVENOUS | Status: DC
  Administered 2017-12-01: 3.5 [IU]/h via INTRAVENOUS
  Filled 2017-12-01 (×2): qty 1

## 2017-12-01 MED ORDER — POTASSIUM CHLORIDE 10 MEQ/100ML IV SOLN
10.0000 meq | INTRAVENOUS | Status: AC
  Administered 2017-12-01: 10 meq via INTRAVENOUS
  Filled 2017-12-01 (×2): qty 100

## 2017-12-01 MED ORDER — ONDANSETRON HCL 4 MG/2ML IJ SOLN
4.0000 mg | Freq: Once | INTRAMUSCULAR | Status: DC
  Filled 2017-12-01: qty 2

## 2017-12-01 MED ORDER — SODIUM CHLORIDE 0.9 % IV BOLUS
1000.0000 mL | Freq: Once | INTRAVENOUS | Status: AC
  Administered 2017-12-01: 1000 mL via INTRAVENOUS

## 2017-12-01 MED ORDER — MORPHINE SULFATE (PF) 4 MG/ML IV SOLN
4.0000 mg | Freq: Once | INTRAVENOUS | Status: DC
  Filled 2017-12-01: qty 1

## 2017-12-01 MED ORDER — SODIUM CHLORIDE 0.9 % IV SOLN
INTRAVENOUS | Status: DC
  Filled 2017-12-01: qty 1

## 2017-12-01 MED ORDER — SODIUM CHLORIDE 0.9 % IV SOLN
INTRAVENOUS | Status: DC
  Administered 2017-12-01: 150 mL/h via INTRAVENOUS

## 2017-12-01 MED ORDER — LORAZEPAM 2 MG/ML IJ SOLN
1.0000 mg | Freq: Every day | INTRAMUSCULAR | Status: DC
  Administered 2017-12-01: 1 mg via INTRAVENOUS
  Filled 2017-12-01: qty 1

## 2017-12-01 MED ORDER — LORAZEPAM 2 MG/ML IJ SOLN: 0.5000 mg | Freq: Every morning | INTRAMUSCULAR | Status: DC

## 2017-12-01 MED ORDER — LORAZEPAM 0.5 MG PO TABS: 0.5000 mg | ORAL_TABLET | Freq: Every morning | ORAL | Status: DC

## 2017-12-01 MED ORDER — SODIUM CHLORIDE 0.9% FLUSH
10.0000 mL | Freq: Two times a day (BID) | INTRAVENOUS | Status: DC
  Administered 2017-12-01 – 2017-12-03 (×3): 10 mL

## 2017-12-01 MED ORDER — DEXTROSE-NACL 5-0.45 % IV SOLN
INTRAVENOUS | Status: DC
  Administered 2017-12-01: 19:00:00 via INTRAVENOUS

## 2017-12-01 MED ORDER — POTASSIUM CHLORIDE 10 MEQ/100ML IV SOLN
10.0000 meq | INTRAVENOUS | Status: DC
  Administered 2017-12-01 (×2): 10 meq via INTRAVENOUS
  Filled 2017-12-01 (×2): qty 100

## 2017-12-01 MED ORDER — ENOXAPARIN SODIUM 40 MG/0.4ML ~~LOC~~ SOLN
40.0000 mg | SUBCUTANEOUS | Status: DC
  Administered 2017-12-01: 40 mg via SUBCUTANEOUS
  Filled 2017-12-01: qty 0.4

## 2017-12-01 MED ORDER — POTASSIUM CHLORIDE 10 MEQ/100ML IV SOLN
10.0000 meq | INTRAVENOUS | Status: AC
  Administered 2017-12-01 – 2017-12-02 (×4): 10 meq via INTRAVENOUS
  Filled 2017-12-01 (×3): qty 100

## 2017-12-01 MED ORDER — SODIUM CHLORIDE 0.9 % IV BOLUS
1000.0000 mL | INTRAVENOUS | Status: AC
  Administered 2017-12-01: 1000 mL via INTRAVENOUS

## 2017-12-01 MED ORDER — SODIUM CHLORIDE 0.9 % IV BOLUS: 1000.0000 mL | Freq: Once | INTRAVENOUS | Status: DC

## 2017-12-01 NOTE — Progress Notes (Signed)
Received patient at [facility] taken, blood sugar checked and it was 187, Started D5w1/2NS.  Patient is NPO.  Will page MD for a diet

## 2017-12-01 NOTE — ED Triage Notes (Signed)
Patient presents to the ED with complaints of syncope sometime yesterday. Per EMS patient lives [place] and uses a wheelchair to get around . Patient reports she believes she feel out the bed. Patient alert and orinted x4. EMS states when they check her CBG her blood sugar was HIGH. Patient given 350cc NS

## 2017-12-01 NOTE — ED Notes (Signed)
Did ekg shown to Dr Lacinda Axon patient is resting

## 2017-12-01 NOTE — ED Notes (Signed)
Patient transported to X-ray 

## 2017-12-01 NOTE — ED Provider Notes (Signed)
[facility] EMERGENCY DEPARTMENT Provider Note   CSN: [id number] Arrival date & time: 12/01/17  1015     History   Chief Complaint Chief Complaint  Patient presents with  . Loss of Consciousness  . Hyperglycemia    HPI Joyce Brown is a 67 y.o. female.  Level 5 caveat for urgency of condition.  Patient allegedly slid out of her wheelchair today.  She just moved to [place] from [place] of state and has no primary care relationship.  Past medical history includes type 1 diabetes, stiff person syndrome, hypertension, fibromyalgia, spinal stenosis, arthritis.  She states normal behavior.  No prodromal illnesses.  She has a sister in the community, but lives independently in her own apartment.     Past Medical History:  Diagnosis Date  . Diabetes mellitus without complication ([place])   . Hypertension     There are no active problems to display for this patient.   History reviewed. No pertinent surgical history.   OB History   None      Home Medications    Prior to Admission medications   Medication Sig Start Date End Date Taking? Authorizing Provider  atorvastatin (LIPITOR) 40 MG tablet Take 40 mg by mouth daily at 6 PM.   Yes [provider]  calcium carbonate (OSCAL) 1500 (600 Ca) MG TABS tablet Take 600 mg of elemental calcium by mouth daily with breakfast.   Yes [provider]  diazepam (VALIUM) 5 MG tablet Take 5 mg by mouth See admin instructions. Take 1 tablet in the Am, 1 tablet at noon, and 2 tablets (10mg ) at bedtime.   Yes [provider]  hydroxypropyl methylcellulose / hypromellose (ISOPTO TEARS / GONIOVISC) 2.5 % ophthalmic solution Place 1 drop into both eyes as needed for dry eyes.   Yes [provider]  ibuprofen (ADVIL,MOTRIN) 200 MG tablet Take 400 mg by mouth 2 (two) times daily.   Yes [provider]  insulin aspart (NOVOLOG) 100 UNIT/ML injection Inject 3-14 Units into the skin 3 (three)  times daily before meals.   Yes [provider]  insulin detemir (LEVEMIR) 100 UNIT/ML injection Inject 14 Units into the skin at bedtime.   Yes [provider]  Misc. Devices MISC by Does not apply route. C PAP   Yes [provider]  mupirocin ointment (BACTROBAN) 2 % Place 1 application into the nose as needed (for nasal use with C-Pap).   Yes [provider]  mycophenolate (CELLCEPT) 500 MG tablet Take 1,000 mg by mouth 2 (two) times daily.   Yes [provider]  sertraline (ZOLOFT) 100 MG tablet Take 100 mg by mouth daily.   Yes [provider]  tiZANidine (ZANAFLEX) 2 MG tablet Take 2-4 mg by mouth at bedtime as needed for muscle spasms.   Yes [provider]    Family History No family history on file.  Social History Social History   Tobacco Use  . Smoking status: Not on file  Substance Use Topics  . Alcohol use: Not on file  . Drug use: Not on file     Allergies   Tylenol [acetaminophen]   Review of Systems Review of Systems  All other systems reviewed and are negative.    Physical Exam Updated Vital Signs BP (!) 141/55 (BP Location: Right Arm)   Pulse 68   Temp 98.7 F (37.1 C) (Oral)   Resp 20   SpO2 100%   Physical Exam  Constitutional:  Speech  is slurred (normal)  HENT:  Head: Normocephalic and atraumatic.  Eyes: Conjunctivae are normal.  Neck: Neck supple.  Cardiovascular: Normal rate and regular rhythm.  Pulmonary/Chest: Effort normal and breath sounds normal.  Abdominal: Soft. Bowel sounds are normal.  Musculoskeletal: Normal range of motion.  Neurological: She is alert.  Skin: Skin is warm and dry.  Psychiatric: She has a normal mood and affect. Her behavior is normal.  Pleasant, cooperative  Nursing note and vitals reviewed.    ED Treatments / Results  Labs (all labs ordered are listed, but only abnormal results are displayed) Labs Reviewed  CBC WITH DIFFERENTIAL/PLATELET -  Abnormal; Notable for the following components:      Result Value   Lymphs Abs 0.3 (*)    All other components within normal limits  COMPREHENSIVE METABOLIC PANEL - Abnormal; Notable for the following components:   CO2 9 (*)    Glucose, Bld 508 (*)    Creatinine, Ser 1.64 (*)    Total Bilirubin 1.8 (*)    GFR calc non Af Amer 32 (*)    GFR calc Af Amer 37 (*)    Anion gap 28 (*)    All other components within normal limits  URINALYSIS, ROUTINE W REFLEX MICROSCOPIC - Abnormal; Notable for the following components:   Color, Urine STRAW (*)    Glucose, UA >=500 (*)    Hgb urine dipstick MODERATE (*)    Ketones, ur 80 (*)    Protein, ur 30 (*)    All other components within normal limits  CBG MONITORING, ED - Abnormal; Notable for the following components:   Glucose-Capillary 468 (*)    All other components within normal limits  CBG MONITORING, ED - Abnormal; Notable for the following components:   Glucose-Capillary 437 (*)    All other components within normal limits  CBG MONITORING, ED - Abnormal; Notable for the following components:   Glucose-Capillary 410 (*)    All other components within normal limits  I-STAT TROPONIN, ED    EKG EKG Interpretation  Date/Time:  Thursday December 01 2017 10:24:41 EDT Ventricular Rate:  68 PR Interval:  160 QRS Duration: 94 QT Interval:  444 QTC Calculation: 472 R Axis:   85 Text Interpretation:  Normal sinus rhythm Possible Left atrial enlargement Borderline ECG Confirmed by Nat Christen [phone]) on 12/01/2017 10:49:11 AM   Radiology Dg Chest 1 View  Result Date: 12/01/2017 CLINICAL DATA:  Syncopal episode. History of diabetes and hypertension. EXAM: CHEST  1 VIEW COMPARISON:  None. FINDINGS: The lungs are mildly hyperinflated. There is no focal infiltrate. The heart and pulmonary vascularity are normal. There is no pleural effusion. A porta catheter tip projects over the midportion of the SVC. There is calcification in the wall of the  aortic arch. The observed bony thorax is unremarkable. IMPRESSION: Hyperinflation consistent with COPD or reactive airway disease. No acute cardiopulmonary abnormality. Thoracic aortic atherosclerosis. Electronically Signed   By: David  Martinique M.D.   On: 12/01/2017 13:01   Ct Head Wo Contrast  Result Date: 12/01/2017 CLINICAL DATA:  Altered mental status. Syncope yesterday. EXAM: CT HEAD WITHOUT CONTRAST TECHNIQUE: Contiguous axial images were obtained from the base of the skull through the vertex without intravenous contrast. COMPARISON:  None. FINDINGS: Brain: There is no evidence of acute infarct, intracranial hemorrhage, mass, midline shift, hydrocephalus, or extra-axial fluid collection. There is mild to moderate cerebellar atrophy. Vascular: Calcified atherosclerosis at the skull base. No hyperdense vessel. Skull: No fracture or focal osseous  lesion. Sinuses/Orbits: Postsurgical changes in the paranasal sinuses without evidence of acute sinusitis. Clear mastoid air cells. Unremarkable orbits. Other: None. IMPRESSION: 1. No evidence of acute intracranial abnormality. 2. Cerebellar atrophy. Electronically Signed   By: Logan Bores M.D.   On: 12/01/2017 14:16    Procedures Procedures (including critical care time)  Medications Ordered in ED Medications  dextrose 5 %-0.45 % sodium chloride infusion (has no administration in time range)  insulin regular (NOVOLIN R,HUMULIN R) 100 Units in sodium chloride 0.9 % 100 mL (1 Units/mL) infusion (3.5 Units/hr Intravenous New Bag/Given 12/01/17 1436)  ondansetron (ZOFRAN) injection 4 mg (has no administration in time range)  morphine 4 MG/ML injection 4 mg (has no administration in time range)  sodium chloride 0.9 % bolus 1,000 mL (0 mLs Intravenous Stopped 12/01/17 1216)  sodium chloride 0.9 % bolus 1,000 mL (0 mLs Intravenous Stopped 12/01/17 1418)     Initial Impression / Assessment and Plan / ED Course  I have reviewed the triage vital signs and the  nursing notes.  Pertinent labs & imaging results that were available during my care of the patient were reviewed by me and considered in my medical decision making (see chart for details).     Patient presents to the ED after slipping out of her wheelchair.  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  Laboratory data suggests DKA.  IV hydration, IV insulin, admit to general medicine.   CRITICAL CARE Performed by: Nat Christen Total critical care time: 30 minutes Critical care time was exclusive of separately billable procedures and treating other patients. Critical care was necessary to treat or prevent imminent or life-threatening deterioration. Critical care was time spent personally by me on the following activities: development of treatment plan with patient and/or surrogate as well as nursing, discussions with consultants, evaluation of patient's response to treatment, examination of patient, obtaining history from patient or surrogate, ordering and performing treatments and interventions, ordering and review of laboratory studies, ordering and review of radiographic studies, pulse oximetry and re-evaluation of patient's condition.  Final Clinical Impressions(s) / ED Diagnoses   Final diagnoses:  Diabetic ketoacidosis without coma associated with type 1 diabetes mellitus ([place])  Stiff person syndrome    ED Discharge Orders    None       Nat Christen, MD 12/01/17 1456

## 2017-12-01 NOTE — H&P (Addendum)
[facility] Admission History and Physical Service Pager: [phone]  Patient name: Joyce Brown Medical record number: [id number] Date of birth: 10/11/1950 Age: 67 y.o. Gender: female  Primary Care Provider: Patient, No Pcp Per Consultants: None Code Status: Full  Chief Complaint: Fall from wheelchair  Assessment and Plan: [name] is a 67 y.o. female presenting after falling from her wheelchair trying to transition to her bed. PMH is significant for T1DM, Stiff-person Syndrome, Fibromyalgia, Aortic Stenosis, and Urge Incontinence.  DKA in the setting of T1DM: She controls her blood sugar with SSI and has continuous blood glucose monitor. She reports taking Novolog 3-14 units TID qAC and Levemir 14 Units qHS. Patient was found to have blood sugar of 508 on arrival in the ED. UA was positive for glucose 500+, moderate hemoglobin, 80 ketones and 30 protein. CMP showed Bicarb low at 9 and anion gap 28. She received 2 L NS Boluses in the ED. She reports medication compliance and that she is able to administer her own medications. Her blood sugars in the morning are usually between 150-205. Patient reports that she fell last nigth while transferring chair to bed and was on the floor all night and did not eat which is likely cause of her DKA. Patient not altered on exam. No reported vomiting, nausea or diarrhea. Patient afebrile with no other signs of infection.  - Admit to step-down, attending Dr. Owens Shark - NS IV 165mL/hr, change IV to D5-1/2NS when CBG <250 - Insulin gtt at 1u/mL - KCl 15mEq IV every hour x2 hours per protocol, monitor closely, last K 3.9 - CBG checks q1 hour - q4 BMPs - transition to subcutaneous insulin and give diet when able - Beta-hydroxybutarate >8.00 in the ED   Stiff-person Syndrome, chronic, stable: diagnosed in 2006. Diazepam 5mg  PO, Mycophenylate 1000mg  BID, and infusion every 2 weeks with Privigen.  - Diazepam 5mg  PO 4 times daily  (5mg  qAM, 5mg  qNoon, 10mg  qPM) - Mycophenylate 1000mg  BID - PT/OT to eval and treat- patient currently lives alone   AKI: GFR 37 and Cr 1.64 on admission. Unsure of this is an acute process as patient's baseline is unknown. Likely pre-renal in the setting of dehydration 2/2 DKA. Patient also laid on floor overnight. CK obtained in ED wnl at 136, so unlikely due to rhabdomyolsis.  - IV Fluids as above - q4 BMPs - Avoid nephrotoxic agents, ensure adequate renal perfusion  ?Hypertension: BP 162/64 and HR 81. Patient has a history of hypertension but resolved and was no longer on anti-hypertensives (HCTZ). Patient without headaches, blurred vision, chest or abdominal pain in the ED. - Daily Vitals - Will restart HCTZ if blood pressure remains elevated.  Spinal Stenosis and Fibromyalgia: patient reports symptoms are well controlled by Tizanidine. - Continue home Tizanidine 2-4mg  qHS PRN muscle spasms   Depression, chronic, stable: patient reports secondary to Fibromyalgia, takes Sertraline 100mg  daily. - Continue home Sertraline   Urge Incontinence: patient takes Oxybutynin at home. - Continue Oxybutynin 5mg  PO BID  FEN/GI: NPO while on insulin gtt Prophylaxis: Lovenox  Disposition: Stepdown  History of Present Illness:  Joyce Brown is a 67 y.o. female with PMH of T1DM and Stiff-person Syndrome presenting to the ED after sliding out of her wheelchair while trying to transfer to the bed around 5:30pm last night (11/30/2017). She reports she "did not quite make it" to the bed and ended up on the floor, but.denies any LOC. She reports that she then fell asleep on  the floor because she could not get back into her wheelchair or bed and just became tired. The patient lives alone and reports she called 911 this morning (9/19) to "get a boost" back into her wheelchair. When EMS arrived they convinced her to come to the hospital where she was found to have elevated blood sugar into the 500s.   Of  note the patient moved to [facility] from [place] about 1 week ago and that the rest of her furniture and belongings were moved yesterday (9/18) in the afternoon. She believes the move made her more stressed and her sugars have been harder to control. She reports she is not yet medically established in the area but reports compliance with all her medications. Her PCP said this may be a good time to move given that she was doing so well.   She developed Stiff-person Syndrome at the same time as T1DM in 2006.    Additionally, the patient previously treat for HTN but has not been on medications for a while, previously was on HCTZ.   Patient's sister in town is also her Healthcare POA named Lillard Anes [phone].  Review Of Systems: Per HPI with the following additions:   Review of Systems  Constitutional: Negative for chills and fever.  Eyes: Negative for blurred vision and double vision.  Respiratory: Negative for cough and shortness of breath.   Cardiovascular: Positive for chest pain.  Gastrointestinal: Positive for abdominal pain. Negative for constipation, diarrhea, heartburn, nausea and vomiting.  Genitourinary: Positive for urgency. Negative for frequency.  Musculoskeletal: Positive for falls. Negative for myalgias.  Neurological: Negative for dizziness, loss of consciousness and headaches.  Endo/Heme/Allergies: Negative for polydipsia.    Patient Active Problem List   Diagnosis Date Noted  . DKA (diabetic ketoacidoses) ([place]) 12/01/2017   Past Medical History: Past Medical History:  Diagnosis Date  . Diabetes mellitus without complication ([place])   . Hypertension     Past Surgical History: Past Surgical History:  Procedure Laterality Date  . ANKLE SURGERY Left   . BILATERAL CARPAL TUNNEL RELEASE    . GASTRIC BYPASS  2014    Ankle surgery Gastric bypass surgery   Social History: Social History   Tobacco Use  . Smoking status: Never Smoker  . Smokeless tobacco:  Never Used  Substance Use Topics  . Alcohol use: Never    Frequency: Never  . Drug use: Never   Additional social history: never smoker, never drinker, never used any illicit drugs Please also refer to relevant sections of EMR.  Family History: No family history on file. Mom (deceased) cervical cancer, A fib Father (deceased) Alzheimer's, heart disease  Allergies and Medications: Allergies  Allergen Reactions  . Tylenol [Acetaminophen] Other (See Comments)    Glucose monitor   No current facility-administered medications on file prior to encounter.    Current Outpatient Medications on File Prior to Encounter  Medication Sig Dispense Refill  . atorvastatin (LIPITOR) 40 MG tablet Take 40 mg by mouth daily at 6 PM.    . calcium carbonate (OSCAL) 1500 (600 Ca) MG TABS tablet Take 600 mg of elemental calcium by mouth daily with breakfast.    . diazepam (VALIUM) 5 MG tablet Take 5 mg by mouth See admin instructions. Take 1 tablet in the Am, 1 tablet at noon, and 2 tablets (10mg ) at bedtime.    . hydroxypropyl methylcellulose / hypromellose (ISOPTO TEARS / GONIOVISC) 2.5 % ophthalmic solution Place 1 drop into both eyes as needed for dry  eyes.    . ibuprofen (ADVIL,MOTRIN) 200 MG tablet Take 400 mg by mouth 2 (two) times daily.    . insulin aspart (NOVOLOG) 100 UNIT/ML injection Inject 3-14 Units into the skin 3 (three) times daily before meals.    . insulin detemir (LEVEMIR) 100 UNIT/ML injection Inject 14 Units into the skin at bedtime.    . Misc. Devices MISC by Does not apply route. C PAP    . mupirocin ointment (BACTROBAN) 2 % Place 1 application into the nose as needed (for nasal use with C-Pap).    . mycophenolate (CELLCEPT) 500 MG tablet Take 1,000 mg by mouth 2 (two) times daily.    . sertraline (ZOLOFT) 100 MG tablet Take 100 mg by mouth daily.    Marland Kitchen tiZANidine (ZANAFLEX) 2 MG tablet Take 2-4 mg by mouth at bedtime as needed for muscle spasms.      Objective: BP (!) 141/55  (BP Location: Right Arm)   Pulse 68   Temp 98.7 F (37.1 C) (Oral)   Resp 20   SpO2 100%   Physical Exam  Constitutional: She is oriented to person, place, and time. She appears well-developed and well-nourished. No distress.  Slow speech, difficult to understand, very pleasant  HENT:  Head: Normocephalic and atraumatic.  Eyes: Pupils are equal, round, and reactive to light. Conjunctivae and EOM are normal. Right eye exhibits no discharge. Left eye exhibits no discharge.  Neck: Normal range of motion. No thyromegaly present.  Cardiovascular: Normal rate, regular rhythm and normal heart sounds.  No murmur heard. Pulmonary/Chest: Effort normal and breath sounds normal. No respiratory distress.  Abdominal: Soft. Bowel sounds are normal. She exhibits no distension. There is no tenderness. There is no guarding.  Dexcom Continuous Blood Glucose Monitor medial LUQ.  Musculoskeletal:  Delayed movement in upper extremities bilaterally, decreased movement in elbow flexion bilaterally; feet/toes in plantar flexion bilaterally  Lymphadenopathy:    She has no cervical adenopathy.  Neurological: She is alert and oriented to person, place, and time. No cranial nerve deficit. She exhibits normal muscle tone. Coordination normal.  Skin: Skin is warm and dry. Capillary refill takes less than 2 seconds. She is not diaphoretic.  Blanchable erythematous patches, excoriations and ecchymoses to bilateral knees and shins, clean, healing  Psychiatric: She has a normal mood and affect. Her behavior is normal. Judgment and thought content normal.   Labs and Imaging: CBC BMET  Recent Labs  Lab 12/01/17 1103  WBC 5.8  HGB 13.3  HCT 40.2  PLT 231   Recent Labs  Lab 12/01/17 1103  NA 135  K 3.9  CL 98  CO2 9*  BUN 23  CREATININE 1.64*  GLUCOSE 508*  CALCIUM 9.3     Dg Chest 1 View  Result Date: 12/01/2017 CLINICAL DATA:  Syncopal episode. History of diabetes and hypertension. EXAM: CHEST  1 VIEW  COMPARISON:  None. FINDINGS: The lungs are mildly hyperinflated. There is no focal infiltrate. The heart and pulmonary vascularity are normal. There is no pleural effusion. A porta catheter tip projects over the midportion of the SVC. There is calcification in the wall of the aortic arch. The observed bony thorax is unremarkable. IMPRESSION: Hyperinflation consistent with COPD or reactive airway disease. No acute cardiopulmonary abnormality. Thoracic aortic atherosclerosis. Electronically Signed   By: David  Martinique M.D.   On: 12/01/2017 13:01   Ct Head Wo Contrast  Result Date: 12/01/2017 CLINICAL DATA:  Altered mental status. Syncope yesterday. EXAM: CT HEAD WITHOUT CONTRAST TECHNIQUE:  Contiguous axial images were obtained from the base of the skull through the vertex without intravenous contrast. COMPARISON:  None. FINDINGS: Brain: There is no evidence of acute infarct, intracranial hemorrhage, mass, midline shift, hydrocephalus, or extra-axial fluid collection. There is mild to moderate cerebellar atrophy. Vascular: Calcified atherosclerosis at the skull base. No hyperdense vessel. Skull: No fracture or focal osseous lesion. Sinuses/Orbits: Postsurgical changes in the paranasal sinuses without evidence of acute sinusitis. Clear mastoid air cells. Unremarkable orbits. Other: None. IMPRESSION: 1. No evidence of acute intracranial abnormality. 2. Cerebellar atrophy. Electronically Signed   By: Logan Bores M.D.   On: 12/01/2017 14:16    Milus Banister, [place], PGY-1 12/01/2017 4:19 PM  FPTS Upper-Level Resident Addendum   I have independently interviewed and examined the patient. I have discussed the above with the original author and agree with their documentation. My edits for correction/addition/clarification are in purple. Please see also any attending notes.    Martinique Taiwana Willison, DO PGY-2, [facility] 12/01/2017 8:51 PM  FPTS Service pager: [phone] (text  pages welcome through [place])

## 2017-12-01 NOTE — ED Notes (Signed)
IV team in to access port-a-cath to left anterior chest wall

## 2017-12-01 NOTE — ED Notes (Signed)
Patient is asked to call sister so that provider can reach her family member; patient's sister did not pick up this RN left a message

## 2017-12-02 ENCOUNTER — Encounter (HOSPITAL_COMMUNITY): Payer: Self-pay | Admitting: Family Medicine

## 2017-12-02 DIAGNOSIS — R55 Syncope and collapse: Secondary | ICD-10-CM

## 2017-12-02 DIAGNOSIS — E108 Type 1 diabetes mellitus with unspecified complications: Secondary | ICD-10-CM

## 2017-12-02 DIAGNOSIS — IMO0002 Reserved for concepts with insufficient information to code with codable children: Secondary | ICD-10-CM

## 2017-12-02 DIAGNOSIS — G119 Hereditary ataxia, unspecified: Secondary | ICD-10-CM

## 2017-12-02 DIAGNOSIS — E1065 Type 1 diabetes mellitus with hyperglycemia: Secondary | ICD-10-CM

## 2017-12-02 LAB — GLUCOSE, CAPILLARY
GLUCOSE-CAPILLARY: 138 mg/dL — AB (ref 70–99)
GLUCOSE-CAPILLARY: 163 mg/dL — AB (ref 70–99)
GLUCOSE-CAPILLARY: 165 mg/dL — AB (ref 70–99)
GLUCOSE-CAPILLARY: 167 mg/dL — AB (ref 70–99)
GLUCOSE-CAPILLARY: 204 mg/dL — AB (ref 70–99)
GLUCOSE-CAPILLARY: 380 mg/dL — AB (ref 70–99)
GLUCOSE-CAPILLARY: 194 mg/dL — AB (ref 70–99)
GLUCOSE-CAPILLARY: 292 mg/dL — AB (ref 70–99)
Glucose-Capillary: 121 mg/dL — ABNORMAL HIGH (ref 70–99)
Glucose-Capillary: 127 mg/dL — ABNORMAL HIGH (ref 70–99)
Glucose-Capillary: 178 mg/dL — ABNORMAL HIGH (ref 70–99)
Glucose-Capillary: 180 mg/dL — ABNORMAL HIGH (ref 70–99)
Glucose-Capillary: 182 mg/dL — ABNORMAL HIGH (ref 70–99)
Glucose-Capillary: 251 mg/dL — ABNORMAL HIGH (ref 70–99)
Glucose-Capillary: 279 mg/dL — ABNORMAL HIGH (ref 70–99)

## 2017-12-02 LAB — BASIC METABOLIC PANEL
ANION GAP: 11 (ref 5–15)
ANION GAP: 11 (ref 5–15)
ANION GAP: 9 (ref 5–15)
BUN: 17 mg/dL (ref 8–23)
BUN: 16 mg/dL (ref 8–23)
BUN: 14 mg/dL (ref 8–23)
CALCIUM: 8.3 mg/dL — AB (ref 8.9–10.3)
CALCIUM: 8.4 mg/dL — AB (ref 8.9–10.3)
CHLORIDE: 112 mmol/L — AB (ref 98–111)
CO2: 15 mmol/L — ABNORMAL LOW (ref 22–32)
CO2: 15 mmol/L — ABNORMAL LOW (ref 22–32)
CO2: 16 mmol/L — AB (ref 22–32)
Calcium: 8.4 mg/dL — ABNORMAL LOW (ref 8.9–10.3)
Chloride: 114 mmol/L — ABNORMAL HIGH (ref 98–111)
Chloride: 112 mmol/L — ABNORMAL HIGH (ref 98–111)
Creatinine, Ser: 1.06 mg/dL — ABNORMAL HIGH (ref 0.44–1.00)
Creatinine, Ser: 1.15 mg/dL — ABNORMAL HIGH (ref 0.44–1.00)
Creatinine, Ser: 1.09 mg/dL — ABNORMAL HIGH (ref 0.44–1.00)
GFR calc non Af Amer: 52 mL/min — ABNORMAL LOW (ref >60)
GFR, EST AFRICAN AMERICAN: 57 mL/min — AB (ref >60)
GFR, EST NON AFRICAN AMERICAN: 49 mL/min — AB (ref >60)
GFR, EST NON AFRICAN AMERICAN: 54 mL/min — AB (ref >60)
Glucose, Bld: 123 mg/dL — ABNORMAL HIGH (ref 70–99)
Glucose, Bld: 180 mg/dL — ABNORMAL HIGH (ref 70–99)
Glucose, Bld: 279 mg/dL — ABNORMAL HIGH (ref 70–99)
POTASSIUM: 3.9 mmol/L (ref 3.5–5.1)
POTASSIUM: 4 mmol/L (ref 3.5–5.1)
Potassium: 3.8 mmol/L (ref 3.5–5.1)
SODIUM: 138 mmol/L (ref 135–145)
Sodium: 140 mmol/L (ref 135–145)
Sodium: 137 mmol/L (ref 135–145)

## 2017-12-02 LAB — HIV ANTIBODY (ROUTINE TESTING W REFLEX): HIV SCREEN 4TH GENERATION: NONREACTIVE

## 2017-12-02 MED ORDER — INSULIN DETEMIR 100 UNIT/ML ~~LOC~~ SOLN
9.0000 [IU] | SUBCUTANEOUS | Status: DC
  Administered 2017-12-02 – 2017-12-03 (×2): 9 [IU] via SUBCUTANEOUS
  Filled 2017-12-02 (×3): qty 0.09

## 2017-12-02 MED ORDER — LORAZEPAM 2 MG/ML IJ SOLN: 0.5000 mg | Freq: Two times a day (BID) | INTRAMUSCULAR | Status: DC

## 2017-12-02 MED ORDER — INSULIN ASPART 100 UNIT/ML ~~LOC~~ SOLN
0.0000 [IU] | Freq: Three times a day (TID) | SUBCUTANEOUS | Status: DC
  Administered 2017-12-02: 5 [IU] via SUBCUTANEOUS
  Administered 2017-12-02: 9 [IU] via SUBCUTANEOUS
  Administered 2017-12-02: 2 [IU] via SUBCUTANEOUS
  Administered 2017-12-03: 7 [IU] via SUBCUTANEOUS
  Administered 2017-12-03: 3 [IU] via SUBCUTANEOUS

## 2017-12-02 MED ORDER — DIAZEPAM 5 MG PO TABS
10.0000 mg | ORAL_TABLET | Freq: Every day | ORAL | Status: DC
  Administered 2017-12-02: 10 mg via ORAL
  Filled 2017-12-02: qty 2

## 2017-12-02 MED ORDER — POTASSIUM CHLORIDE 10 MEQ/100ML IV SOLN
INTRAVENOUS | Status: AC
  Administered 2017-12-02: 10 meq via INTRAVENOUS
  Filled 2017-12-02: qty 100

## 2017-12-02 MED ORDER — SERTRALINE HCL 100 MG PO TABS
100.0000 mg | ORAL_TABLET | Freq: Every day | ORAL | Status: DC
  Administered 2017-12-02 – 2017-12-03 (×2): 100 mg via ORAL
  Filled 2017-12-02 (×2): qty 1

## 2017-12-02 MED ORDER — TIZANIDINE HCL 2 MG PO TABS
2.0000 mg | ORAL_TABLET | Freq: Every evening | ORAL | Status: DC | PRN
  Filled 2017-12-02: qty 2

## 2017-12-02 MED ORDER — DIAZEPAM 5 MG PO TABS: 5.0000 mg | ORAL_TABLET | ORAL | Status: DC

## 2017-12-02 MED ORDER — ATORVASTATIN CALCIUM 40 MG PO TABS
40.0000 mg | ORAL_TABLET | Freq: Every day | ORAL | Status: DC
  Administered 2017-12-02: 40 mg via ORAL
  Filled 2017-12-02: qty 1

## 2017-12-02 MED ORDER — OXYBUTYNIN CHLORIDE 5 MG PO TABS
5.0000 mg | ORAL_TABLET | Freq: Three times a day (TID) | ORAL | Status: DC
  Administered 2017-12-02 – 2017-12-03 (×3): 5 mg via ORAL
  Filled 2017-12-02 (×3): qty 1

## 2017-12-02 MED ORDER — POTASSIUM CHLORIDE 10 MEQ/100ML IV SOLN
10.0000 meq | INTRAVENOUS | Status: AC
  Administered 2017-12-02 (×4): 10 meq via INTRAVENOUS
  Filled 2017-12-02 (×3): qty 100

## 2017-12-02 MED ORDER — LORAZEPAM 0.5 MG PO TABS
0.5000 mg | ORAL_TABLET | Freq: Two times a day (BID) | ORAL | Status: DC
  Administered 2017-12-02: 0.5 mg via ORAL
  Filled 2017-12-02: qty 1

## 2017-12-02 MED ORDER — DIAZEPAM 5 MG PO TABS
5.0000 mg | ORAL_TABLET | Freq: Two times a day (BID) | ORAL | Status: DC
  Administered 2017-12-02 – 2017-12-03 (×3): 5 mg via ORAL
  Filled 2017-12-02 (×3): qty 1

## 2017-12-02 NOTE — Discharge Summary (Signed)
[facility] Discharge Summary  Patient name: Joyce Brown Medical record number: [id number] Date of birth: 1950-05-21 Age: 67 y.o. Gender: female Date of Admission: 12/01/2017  Date of Discharge: 12/03/2017 Admitting Physician: Martyn Malay, MD  Primary Care Provider: Patient, No Pcp Per Consultants: None  Indication for Hospitalization: DKA  Discharge Diagnoses/Problem List:  T1DM Stiff-person Syndrome Fibromyalgia Depression Spinal Stenosis  Disposition: Discharge home  Discharge Condition: Stable  Discharge Exam:  Physical Exam  Constitutional: She is oriented to person, place, and time. She appears well-developed and well-nourished.  Pleasant  HENT:  Head: Normocephalic and atraumatic.  Eyes: EOM are normal.  Cardiovascular: Normal rate, regular rhythm, normal heart sounds and intact distal pulses.  Pulmonary/Chest: Effort normal and breath sounds normal. No respiratory distress.  Abdominal: Soft. Bowel sounds are normal.  Musculoskeletal:  Decreased ROM 2/2 Stiff-person Syndrome in bilateral upper and lower extremities  Neurological: She is alert and oriented to person, place, and time.  Skin: Skin is warm and dry. Capillary refill takes less than 2 seconds.  Excoriations and ecchymoses to bilateral knees continue to improve  Psychiatric: She has a normal mood and affect.   Brief Hospital Course:  Joyce Brown is a 67 year old female admitted for DKA after falling from her wheelchair at home and sleeping on the floor due to having Stiff-person Syndrome. During hospitalization she was placed on IV Insulin and NS and her blood sugar dropped from 508 to the 100s. Potassium was repleted during IV infusion with KCl, electrolytes were closely monitored, and her overall status improved. Her IV fluids were discontinued as patient was able to maintain good PO intake and the patient's home insulin regimen with Sliding Scale Insulin and Levemir was  used for blood sugar control. The patient's labs remained stable, her condition improved and she was medically cleared for discharge home with close outpatient follow up on 12/03/2017.  Issues for Follow Up:  1. Follow up with local neurologist for movement disorder specialist. 2. Consider stopping Oxybutynin due to anti-cholinergic effects.  Significant Procedures: None  Significant Labs and Imaging:  Recent Labs  Lab 12/01/17 1103  WBC 5.8  HGB 13.3  HCT 40.2  PLT 231   Recent Labs  Lab 12/01/17 1103  12/01/17 2010 12/02/17 0011 12/02/17 0405 12/02/17 0840 12/03/17 0419  NA 135   < > 145 140 138 137 139  K 3.9   < > 3.0* 3.9 4.0 3.8 3.7  CL 98   < > 115* 114* 112* 112* 109  CO2 9*   < > 17* 15* 15* 16* 23  GLUCOSE 508*   < > 101* 123* 180* 279* 264*  BUN 23   < > 17 17 16 14 11   CREATININE 1.64*   < > 1.07* 1.06* 1.15* 1.09* 0.85  CALCIUM 9.3   < > 8.3* 8.3* 8.4* 8.4* 8.5*  ALKPHOS 81  --   --   --   --   --   --   AST 31  --   --   --   --   --   --   ALT 27  --   --   --   --   --   --   ALBUMIN 3.9  --   --   --   --   --   --    < > = values in this interval not displayed.    Results/Tests Pending at Time of Discharge: None  Discharge  Medications:  Allergies as of 12/03/2017      Reactions   Tylenol [acetaminophen] Other (See Comments)   Glucose monitor      Medication List    TAKE these medications   atorvastatin 40 MG tablet Commonly known as:  LIPITOR Take 40 mg by mouth daily at 6 PM.   calcium carbonate 1500 (600 Ca) MG Tabs tablet Commonly known as:  OSCAL Take 600 mg of elemental calcium by mouth daily with breakfast.   diazepam 5 MG tablet Commonly known as:  VALIUM Take 5 mg by mouth See admin instructions. Take 1 tablet in the Am, 1 tablet at noon, and 2 tablets (10mg ) at bedtime.   hydroxypropyl methylcellulose / hypromellose 2.5 % ophthalmic solution Commonly known as:  ISOPTO TEARS / GONIOVISC Place 1 drop into both eyes as needed for  dry eyes.   ibuprofen 200 MG tablet Commonly known as:  ADVIL,MOTRIN Take 400 mg by mouth 2 (two) times daily.   insulin aspart 100 UNIT/ML injection Commonly known as:  novoLOG Inject 3-14 Units into the skin 3 (three) times daily before meals.   insulin detemir 100 UNIT/ML injection Commonly known as:  LEVEMIR Inject 14 Units into the skin at bedtime.   Misc. Devices Misc by Does not apply route. C PAP   mupirocin ointment 2 % Commonly known as:  BACTROBAN Place 1 application into the nose as needed (for nasal use with C-Pap).   mycophenolate 500 MG tablet Commonly known as:  CELLCEPT Take 1,000 mg by mouth 2 (two) times daily.   sertraline 100 MG tablet Commonly known as:  ZOLOFT Take 100 mg by mouth daily.   tiZANidine 2 MG tablet Commonly known as:  ZANAFLEX Take 2-4 mg by mouth at bedtime as needed for muscle spasms.       Discharge Instructions: Please refer to Patient Instructions section of EMR for full details.  Patient was counseled important signs and symptoms that should prompt return to medical care, changes in medications, dietary instructions, activity restrictions, and follow up appointments.   Follow-Up Appointments: 12/06/2017 at 9:30am, with Dr. Erin Hearing at [facility] 12/22/2017 at 9:30am, with Dr. Margette Fast at [facility]   Daisy Floro, DO 12/03/2017, 8:36 AM PGY-1, [place]

## 2017-12-02 NOTE — Plan of Care (Signed)
  Problem: Education: Goal: Knowledge of General Education information will improve Description Including pain rating scale, medication(s)/side effects and non-pharmacologic comfort measures Outcome: Completed/Met

## 2017-12-02 NOTE — Progress Notes (Signed)
Inpatient Diabetes Program Recommendations  AACE/ADA: New Consensus Statement on Inpatient Glycemic Control (2015)  Target Ranges:  Prepandial:   less than 140 mg/dL      Peak postprandial:   less than 180 mg/dL (1-2 hours)      Critically ill patients:  140 - 180 mg/dL   Lab Results  Component Value Date   GLUCAP 251 (H) 12/02/2017    Review of Glycemic Control Results for Joyce Brown, Joyce Brown (MRN [id number]) as of 12/02/2017 13:22  Ref. Range 12/02/2017 09:20 12/02/2017 10:17 12/02/2017 12:02  Glucose-Capillary Latest Ref Range: 70 - 99 mg/dL 204 (H) 380 (H) 251 (H)   Diabetes history: Type 1 DM Outpatient Diabetes medications: Novolog 3-14 units TID, Levemir 14 units QHS Current orders for Inpatient glycemic control: Levemir 9 units QD, Novolog 0-9 units TID  Inpatient Diabetes Program Recommendations:    Noted transition from insulin drip. Of note, CO2 was 16 at time of transition. Inpatient insulin orders administered.  Anticipate, BS to be increased today. Would recommend adding meal coverage. Consider Novolog 3 units TID (assuming that patient is consuming >50% of meal). [place] Unable to see those records. Consider an A1C and CMP given patient admitted with DKA and last CO2.  Spoke with patient who verified home medications and reports taking them as prescribed. Patient happened to fall and remained on the floor throughout the night. Patient states, "I was unable to take my evening dose of Novolog because I couldn't get to it and then ended up falling." Since this time, the medications have been moved to a drawer where should could access them. Also, during this time, patient did not have anything by mouth.   Patient plans to follow up on Tuesday with Dr Erin Hearing. Encouraged patient to ask about a referral for endocrinology. Patient usually sees an endo in [place], however, is moving to [place] and will need a new one. Patient is very motivated to see  endo. No further questions regarding DM at this time.   Thanks, Bronson Curb, MSN, RNC-OB Diabetes Coordinator [phone] (8a-5p)   Thanks, Bronson Curb, MSN, RNC-OB Diabetes Coordinator [phone] (8a-5p)

## 2017-12-02 NOTE — Progress Notes (Signed)
Family Medicine Teaching Service Daily Progress Note Intern Pager: [phone]  Patient name: Joyce Brown Medical record number: [id number] Date of birth: [birth year] Age: 67 y.o. Gender: female  Primary Care Provider: Patient, No Pcp Per Consultants: None Code Status: Full  Chief Complaint: Fall from wheelchair  Assessment and Plan: Joyce Brown is a 67 y.o. female presenting after falling from her wheelchair trying to transition to her bed. PMH is significant for T1DM, Stiff-person Syndrome, Fibromyalgia, Aortic Stenosis, and Urge Incontinence.  DKA in the setting of T1DM, resolving:  - D5-1/2NS when CBG <250 - KCl 34mEq IV every hour x2 hours per protocol, monitor closely, last K 3.9 - CBG checks q1 hour - q4 BMPs - transition to subcutaneous insulin and give diet when able - Beta-hydroxybutarate >8.00 in the ED   Stiff-person Syndrome, chronic, stable: Diazepam 5mg  PO, Mycophenylate 1000mg  BID, and infusion every 2 weeks with Privigen.  - Ativan 0.5mg  PO (qAM  and qNoon, 1.0mg  qPM) - Mycophenylate 1000mg  BID - PT/OT to eval and treat- patient currently lives alone   AKI, improving: GFR 32 > 52, Cr 1.64 > 1.09. Patient's baseline is unknown.  - IV Fluids as above - q4 BMPs  - Avoid nephrotoxic agents  Hypertension, improved, stable: BP 124/60 and HR 60. Patient without headaches, blurred vision, chest or abdominal pain in the ED. - Daily Vitals  Spinal Stenosis and Fibromyalgia: patient reports symptoms are well controlled by Tizanidine. - Continue home Tizanidine 2-4mg  qHS PRN muscle spasms   Depression, chronic, stable: patient reports secondary to Fibromyalgia, takes Sertraline 100mg  daily. - Continue home Sertraline   Urge Incontinence: patient takes Oxybutynin at home. - Continue Oxybutynin 5mg  PO BID  FEN/GI: Carb-modified diet Prophylaxis: Lovenox  Disposition: Med-surg  Subjective:  The patient reports feeling great this morning and ate a good  breakfast. She denies headaches, chest pain, shortness of breath, dysuria, polyphagia/polydipsia, nausea, vomiting, and diarrhea. She has to other concerns at this time.  Objective: Temp:  [97.5 F (36.4 C)-98.6 F (37 C)] 97.6 F (36.4 C) (09/20 1205) Pulse Rate:  [60-86] 75 (09/20 1205) Resp:  [13-17] 17 (09/20 0500) BP: (118-162)/(56-66) 118/66 (09/20 1205) SpO2:  [98 %-100 %] 100 % (09/20 1205) Weight:  [78.5 kg] 78.5 kg (09/20 0500)  Physical Exam  Constitutional: She is oriented to person, place, and time. She appears well-developed and well-nourished. No distress.  Pleasant  HENT:  Head: Normocephalic and atraumatic.  Eyes: EOM are normal.  Neck: Normal range of motion. Neck supple.  Cardiovascular: Normal rate, regular rhythm and normal heart sounds.  No murmur heard. Pulmonary/Chest: Effort normal and breath sounds normal. No respiratory distress.  Abdominal: Soft. Bowel sounds are normal. She exhibits no distension.  Musculoskeletal: She exhibits no edema or deformity.  ROM mildly limited in upper and lower extremities bilaterally d/t Stiff-person Syndrome  Lymphadenopathy:    She has no cervical adenopathy.  Neurological: She is alert and oriented to person, place, and time.  Skin: Skin is warm. Capillary refill takes less than 2 seconds.  Improving erythema, excoriations and ecchymoses to bilateral knees  Psychiatric: She has a normal mood and affect. Her behavior is normal.   Laboratory: Recent Labs  Lab 12/01/17 1103  WBC 5.8  HGB 13.3  HCT 40.2  PLT 231   Recent Labs  Lab 12/01/17 1103  12/02/17 0011 12/02/17 0405 12/02/17 0840  NA 135   < > 140 138 137  K 3.9   < > 3.9 4.0 3.8  CL  98   < > 114* 112* 112*  CO2 9*   < > 15* 15* 16*  BUN 23   < > 17 16 14   CREATININE 1.64*   < > 1.06* 1.15* 1.09*  CALCIUM 9.3   < > 8.3* 8.4* 8.4*  PROT 6.9  --   --   --   --   BILITOT 1.8*  --   --   --   --   ALKPHOS 81  --   --   --   --   ALT 27  --   --   --    --   AST 31  --   --   --   --   GLUCOSE 508*   < > 123* 180* 279*   < > = values in this interval not displayed.   Imaging/Diagnostic Tests: Dg Chest 1 View  Result Date: 12/01/2017 CLINICAL DATA:  Syncopal episode. History of diabetes and hypertension. EXAM: CHEST  1 VIEW COMPARISON:  None. FINDINGS: The lungs are mildly hyperinflated. There is no focal infiltrate. The heart and pulmonary vascularity are normal. There is no pleural effusion. A porta catheter tip projects over the midportion of the SVC. There is calcification in the wall of the aortic arch. The observed bony thorax is unremarkable. IMPRESSION: Hyperinflation consistent with COPD or reactive airway disease. No acute cardiopulmonary abnormality. Thoracic aortic atherosclerosis. Electronically Signed   By: David  Martinique M.D.   On: 12/01/2017 13:01   Ct Head Wo Contrast  Result Date: 12/01/2017 CLINICAL DATA:  Altered mental status. Syncope yesterday. EXAM: CT HEAD WITHOUT CONTRAST TECHNIQUE: Contiguous axial images were obtained from the base of the skull through the vertex without intravenous contrast. COMPARISON:  None. FINDINGS: Brain: There is no evidence of acute infarct, intracranial hemorrhage, mass, midline shift, hydrocephalus, or extra-axial fluid collection. There is mild to moderate cerebellar atrophy. Vascular: Calcified atherosclerosis at the skull base. No hyperdense vessel. Skull: No fracture or focal osseous lesion. Sinuses/Orbits: Postsurgical changes in the paranasal sinuses without evidence of acute sinusitis. Clear mastoid air cells. Unremarkable orbits. Other: None. IMPRESSION: 1. No evidence of acute intracranial abnormality. 2. Cerebellar atrophy. Electronically Signed   By: Logan Bores M.D.   On: 12/01/2017 14:16    Daisy Floro, DO 12/02/2017, 2:38 PM PGY-1, [place] Intern pager: [phone], text pages welcome

## 2017-12-02 NOTE — Evaluation (Signed)
Occupational Therapy Evaluation Patient Details Name: Joyce Brown MRN: [id number] DOB: 1950-11-19 Today's Date: 12/02/2017    History of Present Illness Pt is a 67 y.o. female with stiff-person syndrome  (2006) admitted 12/01/17 after falling from w/c and not being able to get up, denies LOC; worked up for DKA in setting of DM1. Other PMH includes spinal stenosis, fibromyalgia, urge incontinence, depression.   Clinical Impression   PT admitted with s/p fall with sustain time down. Pt currently with functional limitiations due to the deficits listed below (see OT problem list). Pt will have family and friend (A) to help unpack apartment. Educated on the need to practice a bowel/ bladder program to reduce incontinence. Pt plans to have life alert button charged and in use upon d/c.  Pt will benefit from skilled OT to increase their independence and safety with adls and balance to allow discharge home safety eval to help with setup and decr fal risk. HHOT could also help assess placement of grab bars in the bathroom.     Follow Up Recommendations  Home health OT    Equipment Recommendations  None recommended by OT    Recommendations for Other Services       Precautions / Restrictions Precautions Precautions: Fall Restrictions Weight Bearing Restrictions: No      Mobility Bed Mobility               General bed mobility comments: in chair on arrval   Transfers Overall transfer level: Needs assistance Equipment used: Rolling walker (2 wheeled) Transfers: Sit to/from Stand Sit to Stand: Min guard              Balance Overall balance assessment: Needs assistance   Sitting balance-Leahy Scale: Fair       Standing balance-Leahy Scale: Poor Standing balance comment: Reliant on UE support                           ADL either performed or assessed with clinical judgement   ADL Overall ADL's : Needs assistance/impaired Eating/Feeding: Modified  independent   Grooming: Oral care;Supervision/safety;Sitting Grooming Details (indicate cue type and reason): able to open all containers with incr time and effort  Upper Body Bathing: Set up   Lower Body Bathing: Set up           Toilet Transfer: Set up           Functional mobility during ADLs: Min guard;Rolling walker General ADL Comments: pt educated on sitting when possible for adls. pt just moving into new apartment with all items arriving yesterday. pt with apartment manager agreeable to placing grab bars in bathroom if patient buys them. pt could benefit from home safety eval      Vision Baseline Vision/History: Wears glasses Wears Glasses: At all times       Perception     Praxis      Pertinent Vitals/Pain Pain Assessment: No/denies pain     Hand Dominance Right   Extremity/Trunk Assessment Upper Extremity Assessment Upper Extremity Assessment: Overall WFL for tasks assessed           Communication Communication Communication: Expressive difficulties   Cognition Arousal/Alertness: Awake/alert Behavior During Therapy: WFL for tasks assessed/performed Overall Cognitive Status: Within Functional Limits for tasks assessed  General Comments  pt reports incontinence. pt and Ot discussing bowel/ bladder schedule to help reduce accidents and fall risk    Exercises     Shoulder Instructions      Home Living Family/patient expects to be discharged to:: Private residence Living Arrangements: Alone Available Help at Discharge: Family;Friend(s);Available PRN/intermittently Type of Home: Apartment Home Access: Level entry     Home Layout: One level     Bathroom Shower/Tub: Tub/shower unit         Home Equipment: Tub bench;Toilet riser;Grab bars - toilet;Grab bars - tub/shower;Hand held shower head;Wheelchair - manual;Hospital bed;Other (comment)(U-step walker)   Additional Comments: Reports  sister or friends may be able to stay with pt first couple of days at d/c if needed      Prior Functioning/Environment Level of Independence: Independent with assistive device(s)        Comments: Household amb with U-step walker, also uses manual w/c often (alway in community); typically performs squat pivot transfers. Wears help-line button. Remains active, travels. [employer] for transportation. Retired Education officer, environmental        OT Problem List: Decreased strength;Decreased activity tolerance;Impaired balance (sitting and/or standing);Decreased safety awareness;Decreased knowledge of use of DME or AE;Decreased knowledge of precautions      OT Treatment/Interventions: Self-care/ADL training;Therapeutic exercise;Neuromuscular education;Energy conservation;DME and/or AE instruction;Therapeutic activities;Patient/family education;Balance training    OT Goals(Current goals can be found in the care plan section) Acute Rehab OT Goals Patient Stated Goal: Get back home and back to traveling OT Goal Formulation: With patient Time For Goal Achievement: 12/16/17 Potential to Achieve Goals: Good  OT Frequency: Min 2X/week   Barriers to D/C:            Co-evaluation              AM-PAC PT "6 Clicks" Daily Activity     Outcome Measure Help from another person eating meals?: None Help from another person taking care of personal grooming?: None Help from another person toileting, which includes using toliet, bedpan, or urinal?: None Help from another person bathing (including washing, rinsing, drying)?: A Little Help from another person to put on and taking off regular upper body clothing?: None Help from another person to put on and taking off regular lower body clothing?: A Little 6 Click Score: 22   End of Session Equipment Utilized During Treatment: Rolling walker Nurse Communication: Mobility status;Precautions  Activity Tolerance: Patient tolerated treatment well Patient left: in chair;with  call bell/phone within reach;with chair alarm set  OT Visit Diagnosis: Unsteadiness on feet (R26.81);Muscle weakness (generalized) (M62.81)                Time: 0981-1914 OT Time Calculation (min): 25 min Charges:  OT General Charges $OT Visit: 1 Visit OT Evaluation $OT Eval Moderate Complexity: 1 Mod OT Treatments $Self Care/Home Management : 8-22 mins   Jeri Modena, OTR/L  Acute Rehabilitation Services Pager: [phone] Office: [phone] .   Parke Poisson B 12/02/2017, 3:41 PM

## 2017-12-02 NOTE — Evaluation (Signed)
Physical Therapy Evaluation Patient Details Name: Joyce Brown MRN: [id number] DOB: 10-06-1950 Today's Date: 12/02/2017   History of Present Illness  Pt is a 67 y.o. female with stiff-person syndrome  (2006) admitted 12/01/17 after falling from w/c and not being able to get up, denies LOC; worked up for DKA in setting of DM1. Other PMH includes spinal stenosis, fibromyalgia, urge incontinence, depression.    Clinical Impression  Pt presents with an overall decrease in functional mobility secondary to above. PTA, pt mod indep with w/c; amb short distances with walker; lives alone. Today, pt able to amb with RW and min guard for balance; required 1x modA to correct LOB. Pt motivated to return home; declining post-acute rehab, but agreeable to [place] services. Reports friends/family can provide initial assist if needed upon return home. Will follow acutely to address established goals.     Follow Up Recommendations Home health PT;Supervision for mobility/OOB    Equipment Recommendations  None recommended by PT    Recommendations for Other Services OT consult     Precautions / Restrictions Precautions Precautions: Fall Restrictions Weight Bearing Restrictions: No      Mobility  Bed Mobility Overal bed mobility: Needs Assistance Bed Mobility: Supine to Sit     Supine to sit: Supervision;HOB elevated     General bed mobility comments: Increased time and effort; no physical assist required  Transfers Overall transfer level: Needs assistance Equipment used: Rolling walker (2 wheeled) Transfers: Sit to/from Stand Sit to Stand: Supervision         General transfer comment: Stood from bed and recliner with RW and supervision for safety; increased time and effort  Ambulation/Gait Ambulation/Gait assistance: Min guard;Mod assist Gait Distance (Feet): 20 Feet Assistive device: Rolling walker (2 wheeled) Gait Pattern/deviations: Step-to pattern;Ataxic Gait velocity:  Decreased Gait velocity interpretation: 1.31 - 2.62 ft/sec, indicative of limited community ambulator General Gait Details: Amb 10' forwards/backwards x2 with RW and min guard; required modA to correct 1x posterior LOB. Slow, ataxic amb; heavy reliance on BUE support  Stairs            Wheelchair Mobility    Modified Rankin (Stroke Patients Only)       Balance Overall balance assessment: Needs assistance   Sitting balance-Leahy Scale: Fair       Standing balance-Leahy Scale: Poor Standing balance comment: Reliant on UE support                             Pertinent Vitals/Pain Pain Assessment: Faces Faces Pain Scale: Hurts a little bit Pain Location: "Coccyx" Pain Descriptors / Indicators: Sore Pain Intervention(s): Monitored during session;Repositioned    Home Living Family/patient expects to be discharged to:: Private residence Living Arrangements: Alone Available Help at Discharge: Family;Friend(s);Available PRN/intermittently Type of Home: Apartment Home Access: Level entry     Home Layout: One level Home Equipment: Tub bench;Toilet riser;Grab bars - toilet;Grab bars - tub/shower;Hand held shower head;Wheelchair - manual;Hospital bed;Other (comment)(U-step walker) Additional Comments: Reports sister or friends may be able to stay with pt first couple of days at d/c if needed    Prior Function Level of Independence: Independent with assistive device(s)         Comments: Household amb with U-step walker, also uses manual w/c often (alway in community); typically performs squat pivot transfers. Wears help-line button. Remains active, travels. [employer] for transportation. Retired Social research officer, government        Extremity/Trunk  Assessment   Upper Extremity Assessment Upper Extremity Assessment: Defer to OT evaluation(Uncoordinated BUE movements noted; dysmetric)    Lower Extremity Assessment Lower Extremity Assessment: RLE deficits/detail;LLE  deficits/detail RLE Deficits / Details: RLE grossly 4/5 throughout; ataxic amb RLE Coordination: decreased gross motor;decreased fine motor LLE Deficits / Details: L hip flex 3/5, knee ext 4/5, knee flex 3/5; ataxic amb LLE Coordination: decreased fine motor;decreased gross motor       Communication   Communication: Expressive difficulties  Cognition Arousal/Alertness: Awake/alert Behavior During Therapy: WFL for tasks assessed/performed Overall Cognitive Status: Within Functional Limits for tasks assessed                                        General Comments General comments (skin integrity, edema, etc.): Educ on strategies for getting off floor after falling; pressure relief    Exercises     Assessment/Plan    PT Assessment Patient needs continued PT services  PT Problem List Decreased strength;Decreased activity tolerance;Decreased balance;Decreased mobility;Decreased coordination;Decreased knowledge of use of DME       PT Treatment Interventions DME instruction;Gait training;Functional mobility training;Therapeutic activities;Therapeutic exercise;Balance training;Patient/family education;Wheelchair mobility training    PT Goals (Current goals can be found in the Care Plan section)  Acute Rehab PT Goals Patient Stated Goal: Get back home and back to traveling PT Goal Formulation: With patient Time For Goal Achievement: 12/16/17 Potential to Achieve Goals: Good    Frequency Min 3X/week   Barriers to discharge Decreased caregiver support      Co-evaluation               AM-PAC PT "6 Clicks" Daily Activity  Outcome Measure Difficulty turning over in bed (including adjusting bedclothes, sheets and blankets)?: A Little Difficulty moving from lying on back to sitting on the side of the bed? : A Little Difficulty sitting down on and standing up from a chair with arms (e.g., wheelchair, bedside commode, etc,.)?: A Little Help needed moving to and  from a bed to chair (including a wheelchair)?: A Little Help needed walking in hospital room?: A Little Help needed climbing 3-5 steps with a railing? : A Lot 6 Click Score: 17    End of Session Equipment Utilized During Treatment: Gait belt Activity Tolerance: Patient tolerated treatment well Patient left: in chair;with call bell/phone within reach Nurse Communication: Mobility status PT Visit Diagnosis: Other abnormalities of gait and mobility (R26.89);Muscle weakness (generalized) (M62.81)    Time: 6384-6659 PT Time Calculation (min) (ACUTE ONLY): 35 min   Charges:   PT Evaluation $PT Eval Moderate Complexity: 1 Mod PT Treatments $Gait Training: 8-22 mins       Mabeline Caras, PT, DPT Acute Rehabilitation Services  Pager [phone] Office [place] 12/02/2017, 10:27 AM

## 2017-12-02 NOTE — Plan of Care (Signed)
  Problem: Health Behavior/Discharge Planning: Goal: Ability to manage health-related needs will improve Outcome: Progressing   Problem: Clinical Measurements: Goal: Diagnostic test results will improve Outcome: Completed/Met Goal: Respiratory complications will improve Outcome: Completed/Met Goal: Cardiovascular complication will be avoided Outcome: Completed/Met

## 2017-12-03 LAB — BASIC METABOLIC PANEL
Anion gap: 7 (ref 5–15)
BUN: 11 mg/dL (ref 8–23)
CO2: 23 mmol/L (ref 22–32)
CREATININE: 0.85 mg/dL (ref 0.44–1.00)
Calcium: 8.5 mg/dL — ABNORMAL LOW (ref 8.9–10.3)
Chloride: 109 mmol/L (ref 98–111)
GFR calc Af Amer: >60 mL/min (ref >60)
Glucose, Bld: 264 mg/dL — ABNORMAL HIGH (ref 70–99)
POTASSIUM: 3.7 mmol/L (ref 3.5–5.1)
SODIUM: 139 mmol/L (ref 135–145)

## 2017-12-03 LAB — GLUCOSE, CAPILLARY
GLUCOSE-CAPILLARY: 302 mg/dL — AB (ref 70–99)
Glucose-Capillary: 233 mg/dL — ABNORMAL HIGH (ref 70–99)
Glucose-Capillary: 231 mg/dL — ABNORMAL HIGH (ref 70–99)

## 2017-12-03 MED ORDER — HEPARIN SOD (PORK) LOCK FLUSH 100 UNIT/ML IV SOLN
500.0000 [IU] | INTRAVENOUS | Status: AC | PRN
  Administered 2017-12-03: 500 [IU]

## 2017-12-03 MED ORDER — POTASSIUM CHLORIDE CRYS ER 20 MEQ PO TBCR
40.0000 meq | EXTENDED_RELEASE_TABLET | Freq: Once | ORAL | Status: AC
  Administered 2017-12-03: 40 meq via ORAL
  Filled 2017-12-03: qty 2

## 2017-12-03 NOTE — Care Management Note (Signed)
Case Management Note  Patient Details  Name: Joyce Brown MRN: [id number] Date of Birth: [birth year]  Subjective/Objective:                    Action/Plan:  Spoke w patient at bedside. She confirms that she does not have WC and does not have any way to get home. PTAR papers left on front of chart, [name] RN will call when DC ready. Spoke w patient about [facility] providers. She would like to use [facility]. She states that she has an appointment with Dr Erin Hearing on Tuesday. Referral placed to clinical liaison Glyn Ade. No further CM needs.  Expected Discharge Date:  12/03/17               Expected Discharge Plan:  [place]  In-House Referral:     Discharge planning Services  CM Consult  Post Acute Care Choice:  Home Health Choice offered to:     DME Arranged:    DME Agency:     HH Arranged:  PT, OT HH Agency:  [facility]  Status of Service:  Completed, signed off  If discussed at [place] of Stay Meetings, dates discussed:    Additional Comments:  Carles Collet, RN 12/03/2017, 11:53 AM

## 2017-12-03 NOTE — Discharge Instructions (Signed)
1. Please follow up on 12/06/2017 at 9:30am, with Dr. Erin Hearing at [facility] ([address], [place], [place] [address]).  2. Also follow up on 12/22/2017 at 9:30am, with Dr. Margette Fast at [facility] ([address], [place], [place] [address]). They can connect you with a Movement Specialist.  3. Keep taking your other medications as prescribed.  It was a joy to take care of you while you were in the hospital!  Milus Banister, [place], PGY-1 12/03/2017 8:34 AM

## 2017-12-03 NOTE — Plan of Care (Signed)
Plan discharge to home via [place].

## 2017-12-04 LAB — URINE CULTURE

## 2017-12-05 DIAGNOSIS — N3941 Urge incontinence: Secondary | ICD-10-CM | POA: Diagnosis not present

## 2017-12-05 DIAGNOSIS — I350 Nonrheumatic aortic (valve) stenosis: Secondary | ICD-10-CM | POA: Diagnosis not present

## 2017-12-05 DIAGNOSIS — Z9181 History of falling: Secondary | ICD-10-CM | POA: Diagnosis not present

## 2017-12-05 DIAGNOSIS — J45909 Unspecified asthma, uncomplicated: Secondary | ICD-10-CM | POA: Diagnosis not present

## 2017-12-05 DIAGNOSIS — F329 Major depressive disorder, single episode, unspecified: Secondary | ICD-10-CM | POA: Diagnosis not present

## 2017-12-05 DIAGNOSIS — M797 Fibromyalgia: Secondary | ICD-10-CM | POA: Diagnosis not present

## 2017-12-05 DIAGNOSIS — M4800 Spinal stenosis, site unspecified: Secondary | ICD-10-CM | POA: Diagnosis not present

## 2017-12-05 DIAGNOSIS — G2582 Stiff-man syndrome: Secondary | ICD-10-CM | POA: Diagnosis not present

## 2017-12-05 DIAGNOSIS — E109 Type 1 diabetes mellitus without complications: Secondary | ICD-10-CM | POA: Diagnosis not present

## 2017-12-05 DIAGNOSIS — Z993 Dependence on wheelchair: Secondary | ICD-10-CM | POA: Diagnosis not present

## 2017-12-05 DIAGNOSIS — I10 Essential (primary) hypertension: Secondary | ICD-10-CM | POA: Diagnosis not present

## 2017-12-06 ENCOUNTER — Encounter: Payer: Self-pay | Admitting: Family Medicine

## 2017-12-06 ENCOUNTER — Other Ambulatory Visit: Payer: Self-pay

## 2017-12-06 ENCOUNTER — Telehealth: Payer: Self-pay | Admitting: Family Medicine

## 2017-12-06 ENCOUNTER — Ambulatory Visit (INDEPENDENT_AMBULATORY_CARE_PROVIDER_SITE_OTHER): Payer: Medicare Other | Admitting: Family Medicine

## 2017-12-06 DIAGNOSIS — Z23 Encounter for immunization: Secondary | ICD-10-CM | POA: Diagnosis not present

## 2017-12-06 DIAGNOSIS — I350 Nonrheumatic aortic (valve) stenosis: Secondary | ICD-10-CM | POA: Diagnosis not present

## 2017-12-06 DIAGNOSIS — Z9884 Bariatric surgery status: Secondary | ICD-10-CM | POA: Diagnosis not present

## 2017-12-06 DIAGNOSIS — E876 Hypokalemia: Secondary | ICD-10-CM | POA: Diagnosis not present

## 2017-12-06 DIAGNOSIS — G2582 Stiff-man syndrome: Secondary | ICD-10-CM

## 2017-12-06 DIAGNOSIS — M4800 Spinal stenosis, site unspecified: Secondary | ICD-10-CM

## 2017-12-06 DIAGNOSIS — R682 Dry mouth, unspecified: Secondary | ICD-10-CM | POA: Insufficient documentation

## 2017-12-06 DIAGNOSIS — E108 Type 1 diabetes mellitus with unspecified complications: Secondary | ICD-10-CM | POA: Diagnosis not present

## 2017-12-06 DIAGNOSIS — IMO0002 Reserved for concepts with insufficient information to code with codable children: Secondary | ICD-10-CM

## 2017-12-06 DIAGNOSIS — J4540 Moderate persistent asthma, uncomplicated: Secondary | ICD-10-CM

## 2017-12-06 DIAGNOSIS — J45909 Unspecified asthma, uncomplicated: Secondary | ICD-10-CM | POA: Insufficient documentation

## 2017-12-06 DIAGNOSIS — E1065 Type 1 diabetes mellitus with hyperglycemia: Secondary | ICD-10-CM

## 2017-12-06 DIAGNOSIS — M797 Fibromyalgia: Secondary | ICD-10-CM | POA: Diagnosis not present

## 2017-12-06 DIAGNOSIS — I10 Essential (primary) hypertension: Secondary | ICD-10-CM | POA: Diagnosis not present

## 2017-12-06 DIAGNOSIS — E109 Type 1 diabetes mellitus without complications: Secondary | ICD-10-CM | POA: Diagnosis not present

## 2017-12-06 NOTE — Telephone Encounter (Signed)
Dan Europe a physical therapist from [facility] is calling to get verbal orders for this patient. Dan Europe said it is okay to leave detailed voicemail.    PT 2 times a week for 5 weeks.   The best call back number is [phone].

## 2017-12-06 NOTE — Patient Instructions (Signed)
Good to see you today!  Thanks for coming in.  I will call you if your tests are not good.  Otherwise I will send you a letter.  If you do not hear from me with in 2 weeks please call our office.     I will refer you to a endocrinologist - if you do not hear from them in 2 weeks please call me or MyChart me   Please come back 1 month to follow up on everything

## 2017-12-06 NOTE — Assessment & Plan Note (Signed)
>>  ASSESSMENT AND PLAN FOR ASTHMA WRITTEN ON 12/06/2017 12:17 PM BY Ingri Diemer L, MD  Using inhaled steroid regularly and rarely uses preventer

## 2017-12-06 NOTE — Assessment & Plan Note (Signed)
Using inhaled steroid regularly and rarely uses preventer

## 2017-12-06 NOTE — Assessment & Plan Note (Signed)
Recovering from recent DKA likely due to stressors and perhaps mixup of medications due to her recent move.   She has been followed by endocrine in [place].  Will refer.  Continue CGM and current insulin regimen and send for records

## 2017-12-06 NOTE — Progress Notes (Signed)
/  Subjective  Joyce Brown is a 67 y.o. female is presenting with the following  Follow up hospitilization for ketoacidosis.  Her first episode in 12 years likely due to her move from [place].   DIABETES Disease Monitoring: Blood Sugar ranges(Severity) -in 100s recently  Associated Symptoms- Polyuria/phagia/dipsia- no      Visual problems- no Medications: Compliance(Modifying factor) - back on her lantus and sliding scale and using continous glucose monitor Hypoglycemic symptoms- no  Timing - continuous  HYPOKALEMIA Was low in the hospital. No new muscle symptoms now. No acute weakness  DRY MOUTH Has had for months.  Has been on oxybutinin for urinary incontinence for about a year.   Has not tried to decrease dose but when misses a dose she has more symptoms.  No new medications/  Chief Complaint noted Review of Symptoms - see HPI PMH - Smoking status noted.    Objective Vital Signs reviewed BP 120/72   Pulse 78   Temp 98.1 F (36.7 C) (Oral)   Wt 159 lb 9.6 oz (72.4 kg)   SpO2 99%  Sitting in wheelchair Very pleasant Heart - Regular rate and rhythm.  No murmurs, gallops or rubs.    Lungs:  Normal respiratory effort, chest expands symmetrically. Lungs are clear to auscultation, no crackles or wheezes. .  Assessments/Plans  See after visit summary for details of patient instuctions  Dry mouth Likely due to her anticholinergic.  Try to lower the dose.  If still bothering her may try to switch to one with less anticholingeric   Hypokalemia Asymptomatic - check labs   Type I diabetes mellitus with complication, uncontrolled ([place]) Recovering from recent DKA likely due to stressors and perhaps mixup of medications due to her recent move.   She has been followed by endocrine in [place].  Will refer.  Continue CGM and current insulin regimen and send for records   Stiff person syndrome Followed by neurology in [place] until moved to [facility],  Has appointment with Dr Jannifer Franklin.  Uses a  combination of medications (on medication list) including privigen.  Sent for Records   Asthma Using inhaled steroid regularly and rarely uses preventer

## 2017-12-06 NOTE — Assessment & Plan Note (Signed)
Asymptomatic - check labs  

## 2017-12-06 NOTE — Assessment & Plan Note (Signed)
Followed by neurology in [place] until moved to [place],  Has appointment with Dr Jannifer Franklin.  Uses a combination of medications (on medication list) including privigen.  Sent for Records

## 2017-12-06 NOTE — Assessment & Plan Note (Signed)
Likely due to her anticholinergic.  Try to lower the dose.  If still bothering her may try to switch to one with less anticholingeric

## 2017-12-07 ENCOUNTER — Telehealth: Payer: Self-pay | Admitting: Family Medicine

## 2017-12-07 LAB — BASIC METABOLIC PANEL
BUN / CREAT RATIO: 15 (ref 12–28)
BUN: 15 mg/dL (ref 8–27)
CHLORIDE: 97 mmol/L (ref 96–106)
CO2: 15 mmol/L — ABNORMAL LOW (ref 20–29)
Calcium: 9.8 mg/dL (ref 8.7–10.3)
Creatinine, Ser: 1 mg/dL (ref 0.57–1.00)
GFR calc non Af Amer: 59 mL/min/{1.73_m2} — ABNORMAL LOW (ref >59)
GFR, EST AFRICAN AMERICAN: 68 mL/min/{1.73_m2} (ref >59)
Glucose: 569 mg/dL (ref 65–99)
Potassium: 4.2 mmol/L (ref 3.5–5.2)
Sodium: 137 mmol/L (ref 134–144)

## 2017-12-07 NOTE — Telephone Encounter (Signed)
Please call in these orders  Thanks  LC  

## 2017-12-07 NOTE — Telephone Encounter (Signed)
Spoke with her She feels well Has been checking her blood sugar with her CBM and calibrating it with her fingerstick twice a day and seems to match Currently her blood sugar is 173 after breakfast. Thinks might have had a sweet drink at [facility] before yesterday visit to explain the high reading  Suggested she contact us if feels bad or if any high readings or that don't match. She agrees

## 2017-12-07 NOTE — Telephone Encounter (Signed)
**  After Hours/ Emergency Line Call*  Received a call to report that Joyce Brown had critical value of CBG 569 on BMP ordered at visit with PCP yesterday.  It appears that DM plan was created during that visit. Red team, will you call to check on patient this morning and see how her CBGs are doing. Will forward to PCP.  Ralene Ok, MD PGY-3, [facility]

## 2017-12-08 DIAGNOSIS — M797 Fibromyalgia: Secondary | ICD-10-CM | POA: Diagnosis not present

## 2017-12-08 DIAGNOSIS — M4800 Spinal stenosis, site unspecified: Secondary | ICD-10-CM | POA: Diagnosis not present

## 2017-12-08 DIAGNOSIS — I10 Essential (primary) hypertension: Secondary | ICD-10-CM | POA: Diagnosis not present

## 2017-12-08 DIAGNOSIS — G2582 Stiff-man syndrome: Secondary | ICD-10-CM | POA: Diagnosis not present

## 2017-12-08 DIAGNOSIS — I350 Nonrheumatic aortic (valve) stenosis: Secondary | ICD-10-CM | POA: Diagnosis not present

## 2017-12-08 DIAGNOSIS — E109 Type 1 diabetes mellitus without complications: Secondary | ICD-10-CM | POA: Diagnosis not present

## 2017-12-08 NOTE — Telephone Encounter (Signed)
Called and left voice message for Dan Europe verifying orders for PT per Dr. Erin Hearing.  Ozella Almond, [place]

## 2017-12-11 ENCOUNTER — Encounter: Payer: Self-pay | Admitting: Family Medicine

## 2017-12-12 ENCOUNTER — Encounter: Payer: Self-pay | Admitting: Family Medicine

## 2017-12-12 DIAGNOSIS — G2582 Stiff-man syndrome: Secondary | ICD-10-CM | POA: Diagnosis not present

## 2017-12-12 DIAGNOSIS — M797 Fibromyalgia: Secondary | ICD-10-CM | POA: Diagnosis not present

## 2017-12-12 DIAGNOSIS — M4800 Spinal stenosis, site unspecified: Secondary | ICD-10-CM | POA: Diagnosis not present

## 2017-12-12 DIAGNOSIS — E109 Type 1 diabetes mellitus without complications: Secondary | ICD-10-CM | POA: Diagnosis not present

## 2017-12-12 DIAGNOSIS — I350 Nonrheumatic aortic (valve) stenosis: Secondary | ICD-10-CM | POA: Diagnosis not present

## 2017-12-12 DIAGNOSIS — I10 Essential (primary) hypertension: Secondary | ICD-10-CM | POA: Diagnosis not present

## 2017-12-13 DIAGNOSIS — E109 Type 1 diabetes mellitus without complications: Secondary | ICD-10-CM | POA: Diagnosis not present

## 2017-12-13 DIAGNOSIS — I10 Essential (primary) hypertension: Secondary | ICD-10-CM | POA: Diagnosis not present

## 2017-12-13 DIAGNOSIS — G2582 Stiff-man syndrome: Secondary | ICD-10-CM | POA: Diagnosis not present

## 2017-12-13 DIAGNOSIS — M4800 Spinal stenosis, site unspecified: Secondary | ICD-10-CM | POA: Diagnosis not present

## 2017-12-13 DIAGNOSIS — I350 Nonrheumatic aortic (valve) stenosis: Secondary | ICD-10-CM | POA: Diagnosis not present

## 2017-12-13 DIAGNOSIS — M797 Fibromyalgia: Secondary | ICD-10-CM | POA: Diagnosis not present

## 2017-12-14 DIAGNOSIS — I350 Nonrheumatic aortic (valve) stenosis: Secondary | ICD-10-CM | POA: Diagnosis not present

## 2017-12-14 DIAGNOSIS — M797 Fibromyalgia: Secondary | ICD-10-CM | POA: Diagnosis not present

## 2017-12-14 DIAGNOSIS — G2582 Stiff-man syndrome: Secondary | ICD-10-CM | POA: Diagnosis not present

## 2017-12-14 DIAGNOSIS — M4800 Spinal stenosis, site unspecified: Secondary | ICD-10-CM | POA: Diagnosis not present

## 2017-12-14 DIAGNOSIS — E109 Type 1 diabetes mellitus without complications: Secondary | ICD-10-CM | POA: Diagnosis not present

## 2017-12-14 DIAGNOSIS — I10 Essential (primary) hypertension: Secondary | ICD-10-CM | POA: Diagnosis not present

## 2017-12-18 ENCOUNTER — Encounter: Payer: Self-pay | Admitting: Family Medicine

## 2017-12-20 DIAGNOSIS — I10 Essential (primary) hypertension: Secondary | ICD-10-CM | POA: Diagnosis not present

## 2017-12-20 DIAGNOSIS — E109 Type 1 diabetes mellitus without complications: Secondary | ICD-10-CM | POA: Diagnosis not present

## 2017-12-20 DIAGNOSIS — I350 Nonrheumatic aortic (valve) stenosis: Secondary | ICD-10-CM | POA: Diagnosis not present

## 2017-12-20 DIAGNOSIS — G2582 Stiff-man syndrome: Secondary | ICD-10-CM | POA: Diagnosis not present

## 2017-12-20 DIAGNOSIS — M4800 Spinal stenosis, site unspecified: Secondary | ICD-10-CM | POA: Diagnosis not present

## 2017-12-20 DIAGNOSIS — M797 Fibromyalgia: Secondary | ICD-10-CM | POA: Diagnosis not present

## 2017-12-21 DIAGNOSIS — I350 Nonrheumatic aortic (valve) stenosis: Secondary | ICD-10-CM | POA: Diagnosis not present

## 2017-12-21 DIAGNOSIS — E109 Type 1 diabetes mellitus without complications: Secondary | ICD-10-CM | POA: Diagnosis not present

## 2017-12-21 DIAGNOSIS — G2582 Stiff-man syndrome: Secondary | ICD-10-CM | POA: Diagnosis not present

## 2017-12-21 DIAGNOSIS — M4800 Spinal stenosis, site unspecified: Secondary | ICD-10-CM | POA: Diagnosis not present

## 2017-12-21 DIAGNOSIS — I10 Essential (primary) hypertension: Secondary | ICD-10-CM | POA: Diagnosis not present

## 2017-12-21 DIAGNOSIS — M797 Fibromyalgia: Secondary | ICD-10-CM | POA: Diagnosis not present

## 2017-12-22 ENCOUNTER — Encounter: Payer: Self-pay | Admitting: Neurology

## 2017-12-22 ENCOUNTER — Encounter: Payer: Self-pay | Admitting: Family Medicine

## 2017-12-22 ENCOUNTER — Ambulatory Visit (INDEPENDENT_AMBULATORY_CARE_PROVIDER_SITE_OTHER): Payer: Medicare Other | Admitting: Neurology

## 2017-12-22 VITALS — BP 117/68 | HR 50

## 2017-12-22 DIAGNOSIS — E538 Deficiency of other specified B group vitamins: Secondary | ICD-10-CM

## 2017-12-22 DIAGNOSIS — G119 Hereditary ataxia, unspecified: Secondary | ICD-10-CM | POA: Diagnosis not present

## 2017-12-22 NOTE — Patient Instructions (Signed)
Reduce the diazepam to 5 mg three times a day.

## 2017-12-22 NOTE — Progress Notes (Signed)
Reason for visit: Cerebellar ataxia  Referring physician: Dr. Rosalee Kaufman Joyce Brown is a 67 y.o. 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  The patient has recently moved from [place] to this area.  The patient has been living alone, she does have and assistant coming in to do housework.  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  Within 2 years, the patient has required a wheelchair primarily for mobility.  The patient is able to stand for transfers into bed or onto the toilet, she has fallen on occasion when she slipped out of the chair.  She does not ambulate much in the home environment.  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  The patient however indicates that she has never had severe stiffness of the muscles of the arms or legs, she does have some low back pain associated with spinal stenosis and takes tizanidine for this.  The patient is also on diazepam, she denies any severe pain issues otherwise.  She has not had any diaphoresis, she reports some mild cognitive changes and short-term memory problems.  She has slurring of speech, she has coordination problems using the arms and legs, she may have some difficulty doing tasks that require fine motor control.  The patient has no true weakness of the extremities, she denies any numbness.  She does have some urinary urgency and occasional incontinence.  She denies issues with choking with swallowing.  She denies any double vision or loss of vision.  She has been on IVIG taking 30 G of Privigen every 2 weeks, it is not clear with talking with the patient that it has offered any significant clinical benefits.  The patient is also  on CellCept taking 1000 mg twice daily.  The patient claims that she has continued to progress with medical therapy.  She has moved to the [place] area recently, her type 1 diabetes is poorly controlled.  She commonly runs in the 400-500 range.  Past Medical History:  Diagnosis Date  . Asthma   . Diabetes mellitus without complication ([place])   . Glaucoma   . Hyperlipidemia   . Hypertension   . Pulmonary embolism ([place])   . Sleep apnea   . Spinal stenosis   . Stiff person syndrome     Past Surgical History:  Procedure Laterality Date  . ABDOMINAL HYSTERECTOMY    . ADENOIDECTOMY    . ANKLE SURGERY Left   . BILATERAL CARPAL TUNNEL RELEASE    . ELBOW SURGERY    . GASTRIC BYPASS  2014  . HAND NEUROPLASTY    . TONSILLECTOMY      Family History  Problem Relation Age of Onset  . Cervical cancer Mother   . Dementia Father   . Retinal detachment Father   . Heart attack Father   . Diabetes Father   . Stroke Father   . Hypertension Father   . Glaucoma Paternal Grandfather     Social history:  reports that she has never smoked. She has never used smokeless tobacco. She reports that she does not drink alcohol or use drugs.  Medications:  Prior to Admission medications   Medication Sig Start Date End Date Taking? Authorizing Provider  albuterol (PROVENTIL HFA;VENTOLIN HFA) 108 (90 Base) MCG/ACT inhaler Inhale 1 puff into the lungs 2 (two) times daily.   Yes [provider]  atorvastatin (LIPITOR) 40 MG tablet Take 40 mg by mouth daily at 6 PM.   Yes [provider]  calcium carbonate (OSCAL) 1500 (600 Ca) MG TABS tablet Take 600 mg of elemental calcium by mouth daily with breakfast.   Yes [provider]  diazepam (VALIUM) 5 MG tablet Take 5 mg by mouth See admin instructions. Take 1 tablet in the Am, 1 tablet at noon, and 2 tablets (10mg ) at bedtime.   Yes [provider]  fluticasone (FLOVENT HFA) 220 MCG/ACT inhaler Inhale 1 puff into the lungs  2 (two) times daily.   Yes [provider]  hydroxypropyl methylcellulose / hypromellose (ISOPTO TEARS / GONIOVISC) 2.5 % ophthalmic solution Place 1 drop into both eyes as needed for dry eyes.   Yes [provider]  ibuprofen (ADVIL,MOTRIN) 200 MG tablet Take 400 mg by mouth 2 (two) times daily.   Yes [provider]  Immune Globulin, Human, (PRIVIGEN IV) Inject into the vein.   Yes [provider]  insulin aspart (NOVOLOG) 100 UNIT/ML injection Inject 3-14 Units into the skin 3 (three) times daily before meals.   Yes [provider]  insulin detemir (LEVEMIR) 100 UNIT/ML injection Inject 14 Units into the skin at bedtime.   Yes [provider]  Misc. Devices MISC by Does not apply route. C PAP   Yes [provider]  Multiple Vitamin (MULTIVITAMIN) capsule Take 1 capsule by mouth daily.   Yes [provider]  mycophenolate (CELLCEPT) 500 MG tablet Take 1,000 mg by mouth 2 (two) times daily.   Yes [provider]  sertraline (ZOLOFT) 100 MG tablet Take 100 mg by mouth daily.   Yes [provider]  tiZANidine (ZANAFLEX) 2 MG tablet Take 2-4 mg by mouth at bedtime as needed for muscle spasms.   Yes [provider]      Allergies  Allergen Reactions  . Tylenol [Acetaminophen] Other (See Comments)    Glucose monitor    ROS:  Out of a complete 14 system review of symptoms, the patient complains only of the following symptoms, and all other reviewed systems are negative.  Fatigue Urinary incontinence Increased thirst Joint pain Confusion, weakness, slurred speech Depression  Blood pressure 117/68, pulse (!) 50.  Physical Exam  General: The patient is alert and cooperative at the time of the examination.  Eyes: Pupils are equal, round, and reactive to light. Discs are flat bilaterally.  Neck: The neck is supple, no carotid bruits are noted.  Respiratory: The respiratory examination is  clear.  Cardiovascular: The cardiovascular examination reveals a regular rate and rhythm, no obvious murmurs or rubs are noted.  Skin: Extremities are without significant edema.  Neurologic Exam  Mental status: The patient is alert and oriented x 3 at the time of the examination. The patient has apparent normal recent and remote memory, with an apparently normal attention span and concentration ability.  Cranial nerves: Facial symmetry is present. There is good sensation of the face to pinprick and soft touch bilaterally. The strength of the facial muscles and the muscles to head turning and shoulder shrug are normal bilaterally. Speech is slightly dysarthric, not aphasic. Extraocular movements are full.  With end gaze, there appears to be downbeat nystagmus.  Visual fields are full. The tongue is midline, and the patient has symmetric elevation of  the soft palate. No obvious hearing deficits are noted.  Motor: The motor testing reveals 5 over 5 strength of all 4 extremities. Good symmetric motor tone is noted throughout.  Sensory: Sensory testing is intact to pinprick, soft touch, vibration sensation, and position sense on all 4 extremities. No evidence of extinction is noted.  Coordination: Cerebellar testing reveals mild dysmetria with finger-nose-finger bilaterally, the patient has more significant dysmetria with heel-to-shin bilaterally.  Gait and station: Gait is wide-based, ataxic, the patient requires assistance with ambulation.  Tandem gait could not be performed.  Romberg was not performed.  Reflexes: Deep tendon reflexes are symmetric, but are depressed bilaterally. Toes are downgoing bilaterally.   Assessment/Plan:  1.  Progressive cerebellar syndrome  The patient has been diagnosed with stiff person syndrome which appears to be incorrect.  The patient does not appear to have symptoms consistent with stiff person syndrome.  She does have type 1 diabetes which is commonly  associated with low level elevations of anti-gad antibodies.  I will repeat the blood work, if the patient does have very high levels of anti-gad antibodies, she may have a cerebellar syndrome associated with these antibodies, but she does not have stiff person syndrome.  The patient is on diazepam which may worsen her symptoms, this may increase slurred speech and worsen gait ataxia.  The diazepam will be reduced to 5 mg 3 times daily, we will eventually taper her off of this completely.  I am not clear that the IVIG has elicited any dramatic benefit with her clinical condition, but for now we will continue the medication until I get to know the patient better.  She will continue on 30 G of Privigen every 2 weeks.  We will continue the tizanidine for now.  The patient will remain on CellCept.  She will have the blood work done today.  She will follow-up in 4 months.  Joyce Alexanders MD 12/22/2017 9:56 AM  [facility] [address] [place], [place] [address]  Phone [phone] Fax [phone]

## 2017-12-23 DIAGNOSIS — E109 Type 1 diabetes mellitus without complications: Secondary | ICD-10-CM | POA: Diagnosis not present

## 2017-12-23 DIAGNOSIS — G2582 Stiff-man syndrome: Secondary | ICD-10-CM | POA: Diagnosis not present

## 2017-12-23 DIAGNOSIS — M4800 Spinal stenosis, site unspecified: Secondary | ICD-10-CM | POA: Diagnosis not present

## 2017-12-23 DIAGNOSIS — I350 Nonrheumatic aortic (valve) stenosis: Secondary | ICD-10-CM | POA: Diagnosis not present

## 2017-12-23 DIAGNOSIS — I10 Essential (primary) hypertension: Secondary | ICD-10-CM | POA: Diagnosis not present

## 2017-12-23 DIAGNOSIS — M797 Fibromyalgia: Secondary | ICD-10-CM | POA: Diagnosis not present

## 2017-12-26 DIAGNOSIS — E109 Type 1 diabetes mellitus without complications: Secondary | ICD-10-CM | POA: Diagnosis not present

## 2017-12-26 DIAGNOSIS — M797 Fibromyalgia: Secondary | ICD-10-CM | POA: Diagnosis not present

## 2017-12-26 DIAGNOSIS — I350 Nonrheumatic aortic (valve) stenosis: Secondary | ICD-10-CM | POA: Diagnosis not present

## 2017-12-26 DIAGNOSIS — M4800 Spinal stenosis, site unspecified: Secondary | ICD-10-CM | POA: Diagnosis not present

## 2017-12-26 DIAGNOSIS — G2582 Stiff-man syndrome: Secondary | ICD-10-CM | POA: Diagnosis not present

## 2017-12-26 DIAGNOSIS — I10 Essential (primary) hypertension: Secondary | ICD-10-CM | POA: Diagnosis not present

## 2017-12-27 DIAGNOSIS — E109 Type 1 diabetes mellitus without complications: Secondary | ICD-10-CM | POA: Diagnosis not present

## 2017-12-27 DIAGNOSIS — I350 Nonrheumatic aortic (valve) stenosis: Secondary | ICD-10-CM | POA: Diagnosis not present

## 2017-12-27 DIAGNOSIS — G2582 Stiff-man syndrome: Secondary | ICD-10-CM | POA: Diagnosis not present

## 2017-12-27 DIAGNOSIS — M797 Fibromyalgia: Secondary | ICD-10-CM | POA: Diagnosis not present

## 2017-12-27 DIAGNOSIS — I10 Essential (primary) hypertension: Secondary | ICD-10-CM | POA: Diagnosis not present

## 2017-12-27 DIAGNOSIS — M4800 Spinal stenosis, site unspecified: Secondary | ICD-10-CM | POA: Diagnosis not present

## 2017-12-28 ENCOUNTER — Ambulatory Visit (INDEPENDENT_AMBULATORY_CARE_PROVIDER_SITE_OTHER): Payer: Medicare Other | Admitting: Family Medicine

## 2017-12-28 ENCOUNTER — Other Ambulatory Visit: Payer: Self-pay

## 2017-12-28 ENCOUNTER — Encounter: Payer: Self-pay | Admitting: Family Medicine

## 2017-12-28 DIAGNOSIS — E1065 Type 1 diabetes mellitus with hyperglycemia: Secondary | ICD-10-CM

## 2017-12-28 DIAGNOSIS — R921 Mammographic calcification found on diagnostic imaging of breast: Secondary | ICD-10-CM | POA: Diagnosis not present

## 2017-12-28 DIAGNOSIS — E108 Type 1 diabetes mellitus with unspecified complications: Secondary | ICD-10-CM | POA: Diagnosis not present

## 2017-12-28 DIAGNOSIS — IMO0002 Reserved for concepts with insufficient information to code with codable children: Secondary | ICD-10-CM

## 2017-12-28 MED ORDER — ATORVASTATIN CALCIUM 40 MG PO TABS: 40.0000 mg | ORAL_TABLET | Freq: Every day | ORAL | 3 refills | Status: DC

## 2017-12-28 NOTE — Assessment & Plan Note (Signed)
Not well controlled but brittle.  Increase sliding scale slightly and patient will meet with endocrine

## 2017-12-28 NOTE — Patient Instructions (Addendum)
Good to see you today!  Thanks for coming in.  For your Diabetes   Increase your sliding scale by one unit at all times and ranges  Discuss further with your new endocrinologist  For your history of Breast Calcifications  I will refer you to the breast center  Take your CD and records to them  I will get you to sign of ROI from [facility]   Cholesterol  I will send in a Rx for Lipitor

## 2017-12-28 NOTE — Assessment & Plan Note (Addendum)
Patient has history of calcifications and was scheduled for a biopsy before she moved.  She has a CD of the images.  Will refer to the breast center

## 2017-12-28 NOTE — Progress Notes (Signed)
Subjective  Joyce Brown is a 67 y.o. female is presenting with the following  DIABETES Disease Monitoring: Blood Sugar ranges(Severity) -from 50s-400s without a real pattern.  Does not take a morning long acting insulin.  Has an appointment to see endocrinology in 3 weeks  Associated Symptoms- Polyuria/phagia/dipsia- no      Visual problems- no Medications: Compliance(Modifying factor) - has her continuous glucose monitor, and sliding scale with her  Hypoglycemic symptoms- none severe  Breast calcifications Patient has history of calcifications and was scheduled for a biopsy before she moved.  She has a CD of the images.  No current pain or discharge     Timing - continuous  Monitoring Labs and Parameters Last A1C: No results found for: HGBA1C Last Lipid: No results found for: CHOL, HDL, LDLDIRECT Last Bmet  Potassium  Date Value Ref Range Status  12/06/2017 4.2 3.5 - 5.2 mmol/L Final   Sodium  Date Value Ref Range Status  12/06/2017 137 134 - 144 mmol/L Final   Creatinine, Ser  Date Value Ref Range Status  12/06/2017 1.00 0.57 - 1.00 mg/dL Final      Chief Complaint noted Review of Symptoms - see HPI PMH - Smoking status noted.    Objective Vital Signs reviewed BP 104/62   Pulse 78   Temp 97.6 F (36.4 C) (Oral)   Wt 163 lb 6.4 oz (74.1 kg)   SpO2 94%   Assessments/Plans  See after visit summary for details of patient instuctions  Type I diabetes mellitus with complication, uncontrolled (Joyce Brown) Not well controlled but brittle.  Increase sliding scale slightly and patient will meet with endocrine   Breast calcifications Patient has history of calcifications and was scheduled for a biopsy before she moved.  She has a CD of the images.  Will refer to the breast center

## 2017-12-29 ENCOUNTER — Encounter: Payer: Self-pay | Admitting: Family Medicine

## 2017-12-29 ENCOUNTER — Telehealth: Payer: Self-pay | Admitting: Family Medicine

## 2017-12-29 ENCOUNTER — Telehealth: Payer: Self-pay | Admitting: Neurology

## 2017-12-29 DIAGNOSIS — I10 Essential (primary) hypertension: Secondary | ICD-10-CM | POA: Diagnosis not present

## 2017-12-29 DIAGNOSIS — I350 Nonrheumatic aortic (valve) stenosis: Secondary | ICD-10-CM | POA: Diagnosis not present

## 2017-12-29 DIAGNOSIS — M4800 Spinal stenosis, site unspecified: Secondary | ICD-10-CM | POA: Diagnosis not present

## 2017-12-29 DIAGNOSIS — M797 Fibromyalgia: Secondary | ICD-10-CM | POA: Diagnosis not present

## 2017-12-29 DIAGNOSIS — G2582 Stiff-man syndrome: Secondary | ICD-10-CM | POA: Diagnosis not present

## 2017-12-29 DIAGNOSIS — E109 Type 1 diabetes mellitus without complications: Secondary | ICD-10-CM | POA: Diagnosis not present

## 2017-12-29 LAB — VITAMIN B12: Vitamin B-12: 1647 pg/mL — ABNORMAL HIGH (ref 232–1245)

## 2017-12-29 LAB — GLIADIN ANTIBODIES, SERUM
Antigliadin Abs, IgA: 3 units (ref 0–19)
GLIADIN IGG: 3 U (ref 0–19)

## 2017-12-29 LAB — COPPER, SERUM: COPPER: 133 ug/dL (ref 72–166)

## 2017-12-29 LAB — GLUTAMIC ACID DECARBOXYLASE AUTO ABS

## 2017-12-29 LAB — RPR: RPR: NONREACTIVE

## 2017-12-29 LAB — VITAMIN E
Vitamin E (Alpha Tocopherol): 17.7 mg/L (ref 9.0–29.0)
Vitamin E(Gamma Tocopherol): 1 mg/L (ref 0.5–4.9)

## 2017-12-29 NOTE — Telephone Encounter (Signed)
Samyra from [facility] called and would like for Dr. Erin Hearing to call her concerning this patients blood sugar readings. Sue Lush would like to discuss what is an appropriate reading for her to continue her physical therapy. Sue Lush states that pt normally reads high but this morning she recorded her sugars at 397 and pt informed her it read at 59 last night. The best contact number for Sue Lush is [phone].

## 2017-12-29 NOTE — Telephone Encounter (Signed)
Left message with her  As long as not symptomatic ok to continue Physical Therapy

## 2017-12-29 NOTE — Telephone Encounter (Signed)
I called the patient.  The blood work is unremarkable exception of the anti-gad antibody is extremely elevated, over 25,000.  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  We will continue the IVIG and CellCept for now, the patient will have a slow taper off of diazepam.

## 2017-12-31 DIAGNOSIS — I350 Nonrheumatic aortic (valve) stenosis: Secondary | ICD-10-CM | POA: Diagnosis not present

## 2017-12-31 DIAGNOSIS — E109 Type 1 diabetes mellitus without complications: Secondary | ICD-10-CM | POA: Diagnosis not present

## 2017-12-31 DIAGNOSIS — M4800 Spinal stenosis, site unspecified: Secondary | ICD-10-CM | POA: Diagnosis not present

## 2017-12-31 DIAGNOSIS — G2582 Stiff-man syndrome: Secondary | ICD-10-CM | POA: Diagnosis not present

## 2017-12-31 DIAGNOSIS — I10 Essential (primary) hypertension: Secondary | ICD-10-CM | POA: Diagnosis not present

## 2017-12-31 DIAGNOSIS — M797 Fibromyalgia: Secondary | ICD-10-CM | POA: Diagnosis not present

## 2018-01-02 DIAGNOSIS — I10 Essential (primary) hypertension: Secondary | ICD-10-CM | POA: Diagnosis not present

## 2018-01-02 DIAGNOSIS — I350 Nonrheumatic aortic (valve) stenosis: Secondary | ICD-10-CM | POA: Diagnosis not present

## 2018-01-02 DIAGNOSIS — M797 Fibromyalgia: Secondary | ICD-10-CM | POA: Diagnosis not present

## 2018-01-02 DIAGNOSIS — E109 Type 1 diabetes mellitus without complications: Secondary | ICD-10-CM | POA: Diagnosis not present

## 2018-01-02 DIAGNOSIS — G2582 Stiff-man syndrome: Secondary | ICD-10-CM | POA: Diagnosis not present

## 2018-01-02 DIAGNOSIS — M4800 Spinal stenosis, site unspecified: Secondary | ICD-10-CM | POA: Diagnosis not present

## 2018-01-03 ENCOUNTER — Encounter: Payer: Self-pay | Admitting: Family Medicine

## 2018-01-03 DIAGNOSIS — I350 Nonrheumatic aortic (valve) stenosis: Secondary | ICD-10-CM | POA: Diagnosis not present

## 2018-01-03 DIAGNOSIS — M4800 Spinal stenosis, site unspecified: Secondary | ICD-10-CM | POA: Diagnosis not present

## 2018-01-03 DIAGNOSIS — M797 Fibromyalgia: Secondary | ICD-10-CM | POA: Diagnosis not present

## 2018-01-03 DIAGNOSIS — E109 Type 1 diabetes mellitus without complications: Secondary | ICD-10-CM | POA: Diagnosis not present

## 2018-01-03 DIAGNOSIS — I10 Essential (primary) hypertension: Secondary | ICD-10-CM | POA: Diagnosis not present

## 2018-01-03 DIAGNOSIS — G2582 Stiff-man syndrome: Secondary | ICD-10-CM | POA: Diagnosis not present

## 2018-01-04 DIAGNOSIS — E109 Type 1 diabetes mellitus without complications: Secondary | ICD-10-CM | POA: Diagnosis not present

## 2018-01-04 DIAGNOSIS — I10 Essential (primary) hypertension: Secondary | ICD-10-CM | POA: Diagnosis not present

## 2018-01-04 DIAGNOSIS — M4800 Spinal stenosis, site unspecified: Secondary | ICD-10-CM | POA: Diagnosis not present

## 2018-01-04 DIAGNOSIS — I350 Nonrheumatic aortic (valve) stenosis: Secondary | ICD-10-CM | POA: Diagnosis not present

## 2018-01-04 DIAGNOSIS — G2582 Stiff-man syndrome: Secondary | ICD-10-CM | POA: Diagnosis not present

## 2018-01-04 DIAGNOSIS — M797 Fibromyalgia: Secondary | ICD-10-CM | POA: Diagnosis not present

## 2018-01-05 ENCOUNTER — Encounter: Payer: Self-pay | Admitting: Family Medicine

## 2018-01-09 DIAGNOSIS — G2582 Stiff-man syndrome: Secondary | ICD-10-CM | POA: Diagnosis not present

## 2018-01-09 DIAGNOSIS — I10 Essential (primary) hypertension: Secondary | ICD-10-CM | POA: Diagnosis not present

## 2018-01-09 DIAGNOSIS — M4800 Spinal stenosis, site unspecified: Secondary | ICD-10-CM | POA: Diagnosis not present

## 2018-01-09 DIAGNOSIS — M797 Fibromyalgia: Secondary | ICD-10-CM | POA: Diagnosis not present

## 2018-01-09 DIAGNOSIS — E109 Type 1 diabetes mellitus without complications: Secondary | ICD-10-CM | POA: Diagnosis not present

## 2018-01-09 DIAGNOSIS — I350 Nonrheumatic aortic (valve) stenosis: Secondary | ICD-10-CM | POA: Diagnosis not present

## 2018-01-10 ENCOUNTER — Encounter: Payer: Self-pay | Admitting: Family Medicine

## 2018-01-10 ENCOUNTER — Telehealth: Payer: Self-pay | Admitting: Neurology

## 2018-01-10 NOTE — Telephone Encounter (Signed)
I have received a disc of MRI studies on this patient.  I reviewed MRI of the brain done in April 2011.  This appears to be unremarkable with exception of diffuse cerebellar atrophy.  No white matter lesions were noted.

## 2018-01-11 DIAGNOSIS — G2582 Stiff-man syndrome: Secondary | ICD-10-CM | POA: Diagnosis not present

## 2018-01-11 DIAGNOSIS — I350 Nonrheumatic aortic (valve) stenosis: Secondary | ICD-10-CM | POA: Diagnosis not present

## 2018-01-11 DIAGNOSIS — I10 Essential (primary) hypertension: Secondary | ICD-10-CM | POA: Diagnosis not present

## 2018-01-11 DIAGNOSIS — M4800 Spinal stenosis, site unspecified: Secondary | ICD-10-CM | POA: Diagnosis not present

## 2018-01-11 DIAGNOSIS — M797 Fibromyalgia: Secondary | ICD-10-CM | POA: Diagnosis not present

## 2018-01-11 DIAGNOSIS — E109 Type 1 diabetes mellitus without complications: Secondary | ICD-10-CM | POA: Diagnosis not present

## 2018-01-12 ENCOUNTER — Other Ambulatory Visit: Payer: Self-pay | Admitting: *Deleted

## 2018-01-12 MED ORDER — INSULIN ASPART 100 UNIT/ML ~~LOC~~ SOLN: 3.0000 [IU] | Freq: Three times a day (TID) | SUBCUTANEOUS | 0 refills | Status: DC

## 2018-01-12 NOTE — Telephone Encounter (Signed)
Verified pharmacy and sent in rx per dr Erin Hearing request. Delray Alt, CMA

## 2018-01-16 ENCOUNTER — Telehealth: Payer: Self-pay | Admitting: Neurology

## 2018-01-16 DIAGNOSIS — M797 Fibromyalgia: Secondary | ICD-10-CM | POA: Diagnosis not present

## 2018-01-16 DIAGNOSIS — I350 Nonrheumatic aortic (valve) stenosis: Secondary | ICD-10-CM | POA: Diagnosis not present

## 2018-01-16 DIAGNOSIS — M4800 Spinal stenosis, site unspecified: Secondary | ICD-10-CM | POA: Diagnosis not present

## 2018-01-16 DIAGNOSIS — G2582 Stiff-man syndrome: Secondary | ICD-10-CM | POA: Diagnosis not present

## 2018-01-16 DIAGNOSIS — E109 Type 1 diabetes mellitus without complications: Secondary | ICD-10-CM | POA: Diagnosis not present

## 2018-01-16 DIAGNOSIS — I10 Essential (primary) hypertension: Secondary | ICD-10-CM | POA: Diagnosis not present

## 2018-01-16 NOTE — Progress Notes (Signed)
Name: Joyce Brown  MRN/ DOB: [id number], February 13, 1951   Age/ Sex: 67 y.o., female    PCP: Lind Covert, MD   Reason for Endocrinology Evaluation: Type 1 Diabetes Mellitus     Date of Initial Endocrinology Visit: 01/17/2018     PATIENT IDENTIFIER: Joyce Brown is a 67 y.o. female with a past medical history of T1DM, cerebellar atrophy, asthma, Hx of gastric bypass, OSA on CPAP . The patient presented for initial ndocrinology clinic visit on 01/17/2018 for consultative assistance with her diabetes management.   Patient moved from [place] in September, 2019 to be close to her sister. She has been diagnosed with stiff person Syndrome but Dr. Jannifer Franklin this may be more of cerebellar atrophy. She is on IVIG at this time.     HPI: [name] was referred for her Type 1 DM    Diagnosed with T1 DM  In early 2007 . 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 She has only had one DKA episode which was in 11/2017. Prior Medications tried: N/A just insulin Hypoglycemia episodes : yes             Symptoms: none             Frequency: 2/ month Hemoglobin A1c has ranged from 8.4% in 01/2017, peaking at 12.1 % in 01/2016. Currently checking blood sugars 4x / day,  before breakfast and during the day   Patient required assistance for hypoglycemia: No  Patient has required hospitalization within the last 1 year from hyper or hypoglycemia: Yes- September, 2019 for DKA  In terms of diet, the patient eats 3 consistent meals a day, avoids snacking and sugar-sweetened beverages.    She does state that if she skips a meal, her glucose will drop . She has not been able to have any symptoms with hypoglycemia for a while.   She has had gastric bypass in 2014 in [place], he weight was close to 280's Lbs    HOME DIABETES REGIMEN: Levemir 18 units QHS Novolog 3-14 units TID QAC per SS          Statin: Yes ACE-I/ARB: No Prior Diabetic Education: Yes    METER DOWNLOAD SUMMARY: Date range evaluated: 10/23-11/5/19 Average Number Tests/Day = 2.6 Overall Mean FS Glucose = 353 Standard Deviation = 137  BG Ranges: Low = 54 High = 507   Hypoglycemic Events/30 Days: BG < 50 = 0 Episodes of symptomatic severe hypoglycemia = 0     CONTINUOUS GLUCOSE MONITORING RECORD INTERPRETATION    Dates of Recording: 10/23- 01/17/18  Sensor description: CGM G5   Results statistics:   CGM use % of time 97.6  Average and SD 308/91  Time in range   20     %  % Time Above 180 87  % Time Below target 1    Glycemic patterns summary: Hyperglycemia noted after breakfast and continues until she goes to bed.   Hyperglycemic episodes  Through the day  Hypoglycemic episodes occurred once (after a meal)  Overnight periods: Glucose drops by 200 points at times  Preprandial periods: high     DIABETIC COMPLICATIONS: Microvascular complications:    Denies: retinopathy, neuroapthy, CKD  Last eye exam: Completed 01/2017  Macrovascular complications:    Denies: CAD, PVD, CVA   Maternal Uncle with T1 DM  Father and paternal GM with T2DM   PAST HISTORY: Past Medical History:  Past Medical  History:  Diagnosis Date  . Asthma   . Cerebellar ataxia ([place])   . Diabetes mellitus without complication ([place])   . Glaucoma   . Hyperlipidemia   . Hypertension   . Pulmonary embolism ([place])   . Sleep apnea   . Spinal stenosis    Past Surgical History:  Past Surgical History:  Procedure Laterality Date  . ABDOMINAL HYSTERECTOMY    . ADENOIDECTOMY    . ANKLE SURGERY Left   . BILATERAL CARPAL TUNNEL RELEASE    . ELBOW SURGERY    . GASTRIC BYPASS  2014  . HAND NEUROPLASTY    . TONSILLECTOMY        Social History:  reports that she has never smoked. She has never used smokeless tobacco. She reports that she does not drink alcohol or use drugs. Family History:   Family History  Problem Relation Age of Onset  . Cervical cancer Mother   . Dementia Father   . Retinal detachment Father   . Heart attack Father   . Diabetes Father   . Stroke Father   . Hypertension Father   . Glaucoma Paternal Grandfather      HOME MEDICATIONS: Allergies as of 01/17/2018      Reactions   Tylenol [acetaminophen] Other (See Comments)   Glucose monitor      Medication List        Accurate as of 01/17/18 12:01 PM. Always use your most recent med list.          albuterol 108 (90 Base) MCG/ACT inhaler Commonly known as:  PROVENTIL HFA;VENTOLIN HFA Inhale 1 puff into the lungs 2 (two) times daily.   atorvastatin 40 MG tablet Commonly known as:  LIPITOR Take 1 tablet (40 mg total) by mouth daily at 6 PM.   calcium carbonate 1500 (600 Ca) MG Tabs tablet Commonly known as:  OSCAL Take 600 mg of elemental calcium by mouth daily with breakfast.   diazepam 5 MG tablet Commonly known as:  VALIUM Take 5 mg by mouth See admin instructions. One tablet three times a day   fluticasone 220 MCG/ACT inhaler Commonly known as:  FLOVENT HFA Inhale 1 puff into the lungs 2 (two) times daily.   hydroxypropyl methylcellulose / hypromellose 2.5 % ophthalmic solution Commonly known as:  ISOPTO TEARS / GONIOVISC Place 1 drop into both eyes as needed for dry eyes.   ibuprofen 200 MG tablet Commonly known as:  ADVIL,MOTRIN Take 400 mg by mouth 2 (two) times daily.   insulin aspart 100 UNIT/ML FlexPen Commonly known as:  NOVOLOG Inject 5 Units into the skin 3 (three) times daily with meals. Max 30 units per day   Insulin Glargine 100 UNIT/ML Solostar Pen Commonly known as:  LANTUS Inject 16 Units into the skin at bedtime.   Insulin Pen Needle 31G X 5 MM Misc Four times a day   Misc. Devices Misc by Does not apply route. C PAP   multivitamin capsule Take 4 capsules by mouth daily. Bariatric fusion   mycophenolate 500 MG tablet Commonly known as:   CELLCEPT Take 1,000 mg by mouth 2 (two) times daily.   oxybutynin 5 MG tablet Commonly known as:  DITROPAN Take 5 mg by mouth 2 (two) times daily.   PRIVIGEN IV Inject into the vein.   sertraline 100 MG tablet Commonly known as:  ZOLOFT Take 100 mg by mouth daily.   tiZANidine 2 MG tablet Commonly known as:  ZANAFLEX Take 2-4 mg by mouth at  bedtime as needed for muscle spasms.        ALLERGIES: Allergies  Allergen Reactions  . Tylenol [Acetaminophen] Other (See Comments)    Glucose monitor     REVIEW OF SYSTEMS: A comprehensive ROS was conducted with the patient and is negative except as per HPI and below:  Review of Systems  Constitutional: Positive for malaise/fatigue. Negative for weight loss.  HENT: Negative for congestion and sore throat.   Eyes: Negative for blurred vision and pain.  Respiratory: Positive for cough. Negative for shortness of breath.   Cardiovascular: Negative for chest pain and palpitations.  Gastrointestinal: Negative for diarrhea and nausea.  Genitourinary: Negative for frequency.  Musculoskeletal: Positive for falls.  Neurological: Positive for focal weakness. Negative for tingling.  Endo/Heme/Allergies: Negative for polydipsia.  Psychiatric/Behavioral: Positive for depression. The patient is not nervous/anxious.    She ambulates with a walker at home, she is able to cook for herself.    OBJECTIVE:   VITAL SIGNS: BP 114/72 (BP Location: Left Arm, Patient Position: Sitting, Cuff Size: Normal)   Pulse 70   Wt 163 lb (73.9 kg)   SpO2 99%    PHYSICAL EXAM:  General: Pt appears well and is in NAD. She was examined in a wheelchair   Hydration: Well-hydrated with moist mucous membranes and good skin turgor  HEENT: Head: Unremarkable with good dentition. Oropharynx clear without exudate.  Eyes: External eye exam normal without stare, lid lag or exophthalmos.  EOM intact.    Neck: General: Supple without adenopathy or carotid  bruits. Thyroid: Thyroid size normal.  No goiter or nodules appreciated. No thyroid bruit.  Lungs: Clear with good BS bilat with no rales, rhonchi, or wheezes  Heart: RRR with normal S1 and S2 and no gallops; no murmurs; no rub  Abdomen: Normoactive bowel sounds, soft, nontender, without masses or organomegaly palpable  Extremities: Lower extremities - No pretibial edema. No lesions.  Skin: Normal texture and temperature to palpation. No rash noted. No Acanthosis nigricans/skin tags. No lipohypertrophy.  Neuro: MS is good with appropriate affect, pt is alert and Ox3    DM foot exam: DM foot exam: The skin of the feet is intact without sores or ulcerations. The pedal pulses are 2+ on right and 2+ on left. The sensation is intact to a screening 5.07, 10 gram monofilament bilaterally    DATA REVIEWED:  Lab Results  Component Value Date   HGBA1C 12.1 (A) 01/17/2018   Lab Results  Component Value Date   CREATININE 1.00 12/06/2017    Alb/cr. Ratio (11/21/17) 11 mg/g (0-30 )  BUN/Cr 16/0.7 mg/dL  LDL 84 mg/dL   ASSESSMENT / PLAN / RECOMMENDATIONS:   1) Type 1 Diabetes Mellitus, poorly controlled, With hypoglycemia unawareness - Most recent A1c of  12.1%. Goal A1c < 7.0 %.  Without hypoglycemia   GENERAL:  In review of her CGM, patient  Has persistent hyperglycemia starting around breakfast time and remains hyperglycemic until bedtime.   My concerns is two things : 1) Glucose drops by at least 100 points over night, pt assures me she does not take Novolog at bedtime                                                     2) Her statement that her glucose will drop is she skips a  meal. Both of the above indicate too much basal , hence will decrease basal insulin  I also discussed with her that Levemir is ~ 18 hrs mechanism of action rather then total 24 hrs, will switch this to Lantus.   Encouraged patient to continue avoiding sugar-sweetened beverages and to avoid snacking.    We  will stop Novcolog SS and start her on a standing meal time insulin dose in addition to a correctional scale to be used if pre-meal BG is > 150 mg/dL   MEDICATIONS:  Stop levemir  Start Lantus at 16 units QHS  Novolog 5 units TID QAC with the following correctional scale - to be added to her 5 units of Novolog   Blood sugar before meal Number of units to inject  Less than 150 0 unit  151 -  200 1 units  201 -  250 2 units  251 -  300 3 units  301 -  350 4 units  351 -  400 5 units  Over 400 Call the physician     EDUCATION / INSTRUCTIONS:  BG monitoring instructions: Patient is instructed to check her blood sugars 4 times a day, before meals and at bedtime.  Call [facility] if: BG persistently < 70 or > 300. . I reviewed the Rule of 15 for the treatment of hypoglycemia in detail with the patient. Pt already follows this.   2) Diabetic complications:   Eye: Does not have known diabetic retinopathy. Last eye exam was   Neuro/ Feet: Does not have known diabetic peripheral neuropathy.  Renal: Patient does not have known baseline CKD. She is not on an ACEI/ARB at present.   3) Lipids: Patient is on a statin. LDL at goal    4) Hx of Gastric Bypass:   Patient at high risk for mineral and vitamin deficiency due to  increased risk of osteoporosis.    Recommendations - Stop Calcium Carbonate - Start Calcium citrate 1200 mg daily - Vitamin D 3000 units daily - Continue Bariatric boost 4 tabs daily    F/u in 3 weeks   Signed electronically by: Mack Guise, MD  [facility]  [facility] [address], [place] [address] Phone: [phone] FAX: [phone]   CC: Lind Covert, [address] [place] [address] Phone: [phone]  Fax: [phone]    Return to Endocrinology clinic as below: Future Appointments  Date Time Provider [place]  02/07/2018   9:50 AM Jameica Couts, Melanie Crazier, MD [facility] None  04/18/2018 11:30 AM Kathrynn Ducking, MD [facility] None

## 2018-01-16 NOTE — Telephone Encounter (Signed)
Message printed and placed in Tina's in-box in the infusion suite/fim 

## 2018-01-16 NOTE — Telephone Encounter (Signed)
Tech [email or web address] has called for a refill of pt's Immune Globulin, Human, (PRIVIGEN IV) Paresh can be reached at [phone] option 2

## 2018-01-17 ENCOUNTER — Encounter: Payer: Self-pay | Admitting: Family Medicine

## 2018-01-17 ENCOUNTER — Encounter: Payer: Self-pay | Admitting: Internal Medicine

## 2018-01-17 ENCOUNTER — Ambulatory Visit (INDEPENDENT_AMBULATORY_CARE_PROVIDER_SITE_OTHER): Payer: Medicare Other | Admitting: Internal Medicine

## 2018-01-17 VITALS — BP 114/72 | HR 70 | Wt 163.0 lb

## 2018-01-17 DIAGNOSIS — Z9884 Bariatric surgery status: Secondary | ICD-10-CM

## 2018-01-17 DIAGNOSIS — E1065 Type 1 diabetes mellitus with hyperglycemia: Secondary | ICD-10-CM

## 2018-01-17 DIAGNOSIS — IMO0002 Reserved for concepts with insufficient information to code with codable children: Secondary | ICD-10-CM

## 2018-01-17 DIAGNOSIS — E108 Type 1 diabetes mellitus with unspecified complications: Principal | ICD-10-CM

## 2018-01-17 LAB — POCT GLYCOSYLATED HEMOGLOBIN (HGB A1C): Hemoglobin A1C: 12.1 % — AB (ref 4.0–5.6)

## 2018-01-17 MED ORDER — INSULIN ASPART 100 UNIT/ML FLEXPEN: 5.0000 [IU] | PEN_INJECTOR | Freq: Three times a day (TID) | SUBCUTANEOUS | 11 refills | Status: DC

## 2018-01-17 MED ORDER — INSULIN PEN NEEDLE 31G X 5 MM MISC: 11 refills | Status: DC

## 2018-01-17 MED ORDER — INSULIN GLARGINE 100 UNIT/ML SOLOSTAR PEN: 16.0000 [IU] | PEN_INJECTOR | Freq: Every day | SUBCUTANEOUS | 99 refills | Status: DC

## 2018-01-17 MED ORDER — CALCIUM CITRATE 250 MG PO TABS: 2.0000 | ORAL_TABLET | Freq: Two times a day (BID) | ORAL | 11 refills | Status: DC

## 2018-01-17 NOTE — Patient Instructions (Addendum)
-   STOP Levemir - Start Lantus at 16 units at Bedtime - Novolog Take 5 units with each meal PLUS the correctional scale below  - Novolog correctional insulin: ADD extra units on insulin to your meal-time 5 units  dose if your blood sugars are higher than . Use the scale below to help guide you:   Blood sugar before meal Number of units to inject  Less than 150 0 unit  151 -  200 1 units  201 -  250 2 units  251 -  300 3 units  301 -  350 4 units  351 -  400 5 units  Over 400 Call the physician    - Change Calcium Carbonate to Calcium Citrate , take a total of 1200 mg daily - Vitamin D3 3000 units daily  - Continue Bariatric fusion

## 2018-01-19 ENCOUNTER — Telehealth: Payer: Self-pay | Admitting: Neurology

## 2018-01-19 DIAGNOSIS — E109 Type 1 diabetes mellitus without complications: Secondary | ICD-10-CM | POA: Diagnosis not present

## 2018-01-19 DIAGNOSIS — I350 Nonrheumatic aortic (valve) stenosis: Secondary | ICD-10-CM | POA: Diagnosis not present

## 2018-01-19 DIAGNOSIS — I10 Essential (primary) hypertension: Secondary | ICD-10-CM | POA: Diagnosis not present

## 2018-01-19 DIAGNOSIS — M4800 Spinal stenosis, site unspecified: Secondary | ICD-10-CM | POA: Diagnosis not present

## 2018-01-19 DIAGNOSIS — G2582 Stiff-man syndrome: Secondary | ICD-10-CM | POA: Diagnosis not present

## 2018-01-19 DIAGNOSIS — M797 Fibromyalgia: Secondary | ICD-10-CM | POA: Diagnosis not present

## 2018-01-19 MED ORDER — VALIUM 5 MG PO TABS: 5.0000 mg | ORAL_TABLET | Freq: Three times a day (TID) | ORAL | 2 refills | Status: DC

## 2018-01-19 NOTE — Telephone Encounter (Signed)
A prescription was sent in for the diazepam, over time, will gradually reduce the dose and hopefully discontinue the medication completely.

## 2018-01-19 NOTE — Telephone Encounter (Signed)
Pt is asking for a refill on her diazepam (VALIUM) 5 MG tablet [place], [facility] [place]

## 2018-01-19 NOTE — Addendum Note (Signed)
Addended by: Kathrynn Ducking on: 01/19/2018 05:18 PM   Modules accepted: Orders

## 2018-01-20 ENCOUNTER — Encounter: Payer: Self-pay | Admitting: Family Medicine

## 2018-01-23 DIAGNOSIS — M797 Fibromyalgia: Secondary | ICD-10-CM | POA: Diagnosis not present

## 2018-01-23 DIAGNOSIS — M4800 Spinal stenosis, site unspecified: Secondary | ICD-10-CM | POA: Diagnosis not present

## 2018-01-23 DIAGNOSIS — I10 Essential (primary) hypertension: Secondary | ICD-10-CM | POA: Diagnosis not present

## 2018-01-23 DIAGNOSIS — I350 Nonrheumatic aortic (valve) stenosis: Secondary | ICD-10-CM | POA: Diagnosis not present

## 2018-01-23 DIAGNOSIS — E109 Type 1 diabetes mellitus without complications: Secondary | ICD-10-CM | POA: Diagnosis not present

## 2018-01-23 DIAGNOSIS — G2582 Stiff-man syndrome: Secondary | ICD-10-CM | POA: Diagnosis not present

## 2018-01-23 NOTE — Telephone Encounter (Signed)
Danae Orleans with [facility] [phone] called to say that patient gets generic valium and wants to know if it can be changed. Please call and advise.

## 2018-01-23 NOTE — Telephone Encounter (Signed)
Spoke with Joyce Brown and gave verbal ok to dispense generic Diazepam instead of brand name Valium/fim

## 2018-01-24 ENCOUNTER — Other Ambulatory Visit: Payer: Self-pay

## 2018-01-24 ENCOUNTER — Encounter: Payer: Self-pay | Admitting: Family Medicine

## 2018-01-24 MED ORDER — SERTRALINE HCL 100 MG PO TABS: 100.0000 mg | ORAL_TABLET | Freq: Every day | ORAL | 1 refills | Status: DC

## 2018-01-24 MED ORDER — OXYBUTYNIN CHLORIDE 5 MG PO TABS: 5.0000 mg | ORAL_TABLET | Freq: Two times a day (BID) | ORAL | 1 refills | Status: DC

## 2018-01-25 ENCOUNTER — Other Ambulatory Visit: Payer: Self-pay | Admitting: Family Medicine

## 2018-01-25 ENCOUNTER — Encounter: Payer: Self-pay | Admitting: Family Medicine

## 2018-01-25 DIAGNOSIS — I10 Essential (primary) hypertension: Secondary | ICD-10-CM | POA: Diagnosis not present

## 2018-01-25 DIAGNOSIS — R921 Mammographic calcification found on diagnostic imaging of breast: Secondary | ICD-10-CM

## 2018-01-25 DIAGNOSIS — G2582 Stiff-man syndrome: Secondary | ICD-10-CM | POA: Diagnosis not present

## 2018-01-25 DIAGNOSIS — M797 Fibromyalgia: Secondary | ICD-10-CM | POA: Diagnosis not present

## 2018-01-25 DIAGNOSIS — E109 Type 1 diabetes mellitus without complications: Secondary | ICD-10-CM | POA: Diagnosis not present

## 2018-01-25 DIAGNOSIS — I350 Nonrheumatic aortic (valve) stenosis: Secondary | ICD-10-CM | POA: Diagnosis not present

## 2018-01-25 DIAGNOSIS — M4800 Spinal stenosis, site unspecified: Secondary | ICD-10-CM | POA: Diagnosis not present

## 2018-01-26 DIAGNOSIS — M4800 Spinal stenosis, site unspecified: Secondary | ICD-10-CM | POA: Diagnosis not present

## 2018-01-26 DIAGNOSIS — I350 Nonrheumatic aortic (valve) stenosis: Secondary | ICD-10-CM | POA: Diagnosis not present

## 2018-01-26 DIAGNOSIS — I10 Essential (primary) hypertension: Secondary | ICD-10-CM | POA: Diagnosis not present

## 2018-01-26 DIAGNOSIS — E109 Type 1 diabetes mellitus without complications: Secondary | ICD-10-CM | POA: Diagnosis not present

## 2018-01-26 DIAGNOSIS — G2582 Stiff-man syndrome: Secondary | ICD-10-CM | POA: Diagnosis not present

## 2018-01-26 DIAGNOSIS — M797 Fibromyalgia: Secondary | ICD-10-CM | POA: Diagnosis not present

## 2018-01-30 ENCOUNTER — Encounter: Payer: Self-pay | Admitting: Family Medicine

## 2018-01-30 DIAGNOSIS — E109 Type 1 diabetes mellitus without complications: Secondary | ICD-10-CM | POA: Diagnosis not present

## 2018-01-30 DIAGNOSIS — M4800 Spinal stenosis, site unspecified: Secondary | ICD-10-CM | POA: Diagnosis not present

## 2018-01-30 DIAGNOSIS — M797 Fibromyalgia: Secondary | ICD-10-CM | POA: Diagnosis not present

## 2018-01-30 DIAGNOSIS — G2582 Stiff-man syndrome: Secondary | ICD-10-CM | POA: Diagnosis not present

## 2018-01-30 DIAGNOSIS — I10 Essential (primary) hypertension: Secondary | ICD-10-CM | POA: Diagnosis not present

## 2018-01-30 DIAGNOSIS — I350 Nonrheumatic aortic (valve) stenosis: Secondary | ICD-10-CM | POA: Diagnosis not present

## 2018-02-01 ENCOUNTER — Telehealth: Payer: Self-pay | Admitting: *Deleted

## 2018-02-01 DIAGNOSIS — I10 Essential (primary) hypertension: Secondary | ICD-10-CM | POA: Diagnosis not present

## 2018-02-01 DIAGNOSIS — I350 Nonrheumatic aortic (valve) stenosis: Secondary | ICD-10-CM | POA: Diagnosis not present

## 2018-02-01 DIAGNOSIS — E109 Type 1 diabetes mellitus without complications: Secondary | ICD-10-CM | POA: Diagnosis not present

## 2018-02-01 DIAGNOSIS — G2582 Stiff-man syndrome: Secondary | ICD-10-CM | POA: Diagnosis not present

## 2018-02-01 DIAGNOSIS — M797 Fibromyalgia: Secondary | ICD-10-CM | POA: Diagnosis not present

## 2018-02-01 DIAGNOSIS — M4800 Spinal stenosis, site unspecified: Secondary | ICD-10-CM | POA: Diagnosis not present

## 2018-02-01 NOTE — Telephone Encounter (Signed)
Attempted PA for Diazepam 5mg  tablets, #90/30. I called [employer], phone# [phone] to complete PA. Was told PA can only be completed via CoverMyMeds. Case was started. Key# [id number]. Member ID: [id number].  Received message that [employer] is not able to respond with questions b/c pt. currently has access to the medication and PA is not needed./fim

## 2018-02-03 ENCOUNTER — Ambulatory Visit (INDEPENDENT_AMBULATORY_CARE_PROVIDER_SITE_OTHER): Payer: Medicare Other | Admitting: Podiatry

## 2018-02-03 ENCOUNTER — Encounter: Payer: Self-pay | Admitting: Podiatry

## 2018-02-03 DIAGNOSIS — Z794 Long term (current) use of insulin: Secondary | ICD-10-CM

## 2018-02-03 DIAGNOSIS — M21619 Bunion of unspecified foot: Secondary | ICD-10-CM | POA: Diagnosis not present

## 2018-02-03 DIAGNOSIS — E119 Type 2 diabetes mellitus without complications: Secondary | ICD-10-CM

## 2018-02-03 DIAGNOSIS — B351 Tinea unguium: Secondary | ICD-10-CM

## 2018-02-03 DIAGNOSIS — M79675 Pain in left toe(s): Secondary | ICD-10-CM | POA: Diagnosis not present

## 2018-02-03 DIAGNOSIS — L84 Corns and callosities: Secondary | ICD-10-CM | POA: Diagnosis not present

## 2018-02-03 DIAGNOSIS — M79674 Pain in right toe(s): Secondary | ICD-10-CM | POA: Diagnosis not present

## 2018-02-03 DIAGNOSIS — M2010 Hallux valgus (acquired), unspecified foot: Secondary | ICD-10-CM

## 2018-02-03 NOTE — Patient Instructions (Addendum)
Diabetes and Foot Care Diabetes may cause you to have problems because of poor blood supply (circulation) to your feet and legs. This may cause the skin on your feet to become thinner, break easier, and heal more slowly. Your skin may become dry, and the skin may peel and crack. You may also have nerve damage in your legs and feet causing decreased feeling in them. You may not notice minor injuries to your feet that could lead to infections or more serious problems. Taking care of your feet is one of the most important things you can do for yourself. Follow these instructions at home:  Wear shoes at all times, even in the house. Do not go barefoot. Bare feet are easily injured.  Check your feet daily for blisters, cuts, and redness. If you cannot see the bottom of your feet, use a mirror or ask someone for help.  Wash your feet with warm water (do not use hot water) and mild soap. Then pat your feet and the areas between your toes until they are completely dry. Do not soak your feet as this can dry your skin.  Apply a moisturizing lotion or petroleum jelly (that does not contain alcohol and is unscented) to the skin on your feet and to dry, brittle toenails. Do not apply lotion between your toes.  Trim your toenails straight across. Do not dig under them or around the cuticle. File the edges of your nails with an emery board or nail file.  Do not cut corns or calluses or try to remove them with medicine.  Wear clean socks or stockings every day. Make sure they are not too tight. Do not wear knee-high stockings since they may decrease blood flow to your legs.  Wear shoes that fit properly and have enough cushioning. To break in new shoes, wear them for just a few hours a day. This prevents you from injuring your feet. Always look in your shoes before you put them on to be sure there are no objects inside.  Do not cross your legs. This may decrease the blood flow to your feet.  If you find a  minor scrape, cut, or break in the skin on your feet, keep it and the skin around it clean and dry. These areas may be cleansed with mild soap and water. Do not cleanse the area with peroxide, alcohol, or iodine.  When you remove an adhesive bandage, be sure not to damage the skin around it.  If you have a wound, look at it several times a day to make sure it is healing.  Do not use heating pads or hot water bottles. They may burn your skin. If you have lost feeling in your feet or legs, you may not know it is happening until it is too late.  Make sure your health care provider performs a complete foot exam at least annually or more often if you have foot problems. Report any cuts, sores, or bruises to your health care provider immediately. Contact a health care provider if:  You have an injury that is not healing.  You have cuts or breaks in the skin.  You have an ingrown nail.  You notice redness on your legs or feet.  You feel burning or tingling in your legs or feet.  You have pain or cramps in your legs and feet.  Your legs or feet are numb.  Your feet always feel cold. Get help right away if:  There is increasing   redness, swelling, or pain in or around a wound.  There is a red line that goes up your leg.  Pus is coming from a wound.  You develop a fever or as directed by your health care provider.  You notice a bad smell coming from an ulcer or wound. This information is not intended to replace advice given to you by your health care provider. Make sure you discuss any questions you have with your health care provider. Document Released: 02/27/2000 Document Revised: 08/07/2015 Document Reviewed: 08/08/2012 Elsevier Interactive Patient Education  2017 Elsevier Inc. Onychomycosis/Fungal Toenails  WHAT IS IT? An infection that lies within the keratin of your nail plate that is caused by a fungus.  WHY ME? Fungal infections affect all ages, sexes, races, and creeds.   There may be many factors that predispose you to a fungal infection such as age, coexisting medical conditions such as diabetes, or an autoimmune disease; stress, medications, fatigue, genetics, etc.  Bottom line: fungus thrives in a warm, moist environment and your shoes offer such a location.  IS IT CONTAGIOUS? Theoretically, yes.  You do not want to share shoes, nail clippers or files with someone who has fungal toenails.  Walking around barefoot in the same room or sleeping in the same bed is unlikely to transfer the organism.  It is important to realize, however, that fungus can spread easily from one nail to the next on the same foot.  HOW DO WE TREAT THIS?  There are several ways to treat this condition.  Treatment may depend on many factors such as age, medications, pregnancy, liver and kidney conditions, etc.  It is best to ask your doctor which options are available to you.  1. No treatment.   Unlike many other medical concerns, you can live with this condition.  However for many people this can be a painful condition and may lead to ingrown toenails or a bacterial infection.  It is recommended that you keep the nails cut short to help reduce the amount of fungal nail. 2. Topical treatment.  These range from herbal remedies to prescription strength nail lacquers.  About 40-50% effective, topicals require twice daily application for approximately 9 to 12 months or until an entirely new nail has grown out.  The most effective topicals are medical grade medications available through physicians offices. 3. Oral antifungal medications.  With an 80-90% cure rate, the most common oral medication requires 3 to 4 months of therapy and stays in your system for a year as the new nail grows out.  Oral antifungal medications do require blood work to make sure it is a safe drug for you.  A liver function panel will be performed prior to starting the medication and after the first month of treatment.  It is  important to have the blood work performed to avoid any harmful side effects.  In general, this medication safe but blood work is required. 4. Laser Therapy.  This treatment is performed by applying a specialized laser to the affected nail plate.  This therapy is noninvasive, fast, and non-painful.  It is not covered by insurance and is therefore, out of pocket.  The results have been very good with a 80-95% cure rate.  [facility] is the only practice in the area to offer this therapy. 5. Permanent Nail Avulsion.  Removing the entire nail so that a new nail will not grow back. 

## 2018-02-04 ENCOUNTER — Encounter: Payer: Self-pay | Admitting: Family Medicine

## 2018-02-06 ENCOUNTER — Other Ambulatory Visit: Payer: Self-pay | Admitting: Family Medicine

## 2018-02-06 DIAGNOSIS — R921 Mammographic calcification found on diagnostic imaging of breast: Secondary | ICD-10-CM

## 2018-02-07 ENCOUNTER — Ambulatory Visit (INDEPENDENT_AMBULATORY_CARE_PROVIDER_SITE_OTHER): Payer: Medicare Other | Admitting: Internal Medicine

## 2018-02-07 ENCOUNTER — Telehealth: Payer: Self-pay | Admitting: Podiatry

## 2018-02-07 ENCOUNTER — Encounter: Payer: Self-pay | Admitting: Internal Medicine

## 2018-02-07 VITALS — BP 118/68 | HR 64 | Ht 70.0 in | Wt 163.0 lb

## 2018-02-07 DIAGNOSIS — E1065 Type 1 diabetes mellitus with hyperglycemia: Secondary | ICD-10-CM | POA: Diagnosis not present

## 2018-02-07 LAB — MICROALBUMIN / CREATININE URINE RATIO
CREATININE, U: 183.7 mg/dL
MICROALB/CREAT RATIO: 1.2 mg/g (ref 0.0–30.0)
Microalb, Ur: 2.3 mg/dL — ABNORMAL HIGH (ref 0.0–1.9)

## 2018-02-07 MED ORDER — MICROLET LANCETS MISC: 11 refills | Status: DC

## 2018-02-07 NOTE — Telephone Encounter (Signed)
This is Joyce Brown with Dr. Konrad Penta office in regards to records I'm trying to send down on  Ms. Djordjevic. Please call me back at [phone] to let me know if you received anything.

## 2018-02-07 NOTE — Telephone Encounter (Signed)
Joyce Brown I received the notes she faxed on Friday 22 November. I told her I had notes from 07 June 2017 back to 2015. She stated she could send more but I told her this should be fine, however, if the doctor wants the rest of the records I would be in touch with her.

## 2018-02-07 NOTE — Patient Instructions (Signed)
-   Decrease Lantus to 14 units  - Increase Novolog to 7 units with each meal

## 2018-02-07 NOTE — Progress Notes (Signed)
Name: Joyce Brown  Age/ Sex: 67 y.o., female   MRN/ DOB: [id number], June 20, 1950     PCP: Lind Covert, MD   Reason for Endocrinology Evaluation: Type 1 Diabetes Mellitus  Initial Endocrine Consultative Visit: 01/17/2018    PATIENT IDENTIFIER: [name] is a 67 y.o. female with a past medical history of T1DM, cerebellar atrophy, asthma, Hx of gastric bypass, OSA on CPAP and Hx of gastric bypass in 2014  . The patient has followed with Endocrinology clinic since 01/17/18 for consultative assistance with management of her diabetes.  Patient moved from [place] in September, 2019 to be close to her sister. She has been diagnosed with stiff person Syndrome but Dr. Jannifer Franklin this may be more of cerebellar atrophy. She is on IVIG at this time.   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 She has only had one DKA episode which was in 11/2017. Marland Kitchen Her hemoglobin A1c has ranged from 8.4% in 01/2017, peaking at 12.1 % in 01/2016  SUBJECTIVE:   During the last visit (01/17/18): A1c was 12.1%. She was having hypoglycemia alternating with extreme hyperglycemia. We switched her Levemir to Lantus, we continued Novolog at 5 units and adjusted correctional scale (BG-100/50)  Today (02/07/2018): Joyce Brown  She checks her blood sugars 2 times daily, used CGM multiple times a day. The patient has had hypoglycemic episodes since the last clinic visit, which typically occur 1 x / week- most often occuring during the day . The patient is not symptomatic with these episodes. Otherwise, the patient hasnot required any recent emergency interventions for hypoglycemia and hasnot had recent hospitalizations secondary to hyper or hypoglycemic episodes.    ROS: As per HPI and as  detailed below: Review of Systems  Constitutional: Negative for fever and weight loss.  Respiratory: Negative for cough and shortness of breath.   Cardiovascular: Negative for chest pain and palpitations.  Gastrointestinal: Negative for diarrhea and nausea.      HOME DIABETES REGIMEN:  Lantus 16 units QHS Novolog 5 units tid with meals  Blood sugar before meal Number of units to inject  Less than 150 0 unit  151 -  200 1 units  201 -  250 2 units  251 -  300 3 units  301 -  350 4 units  351 -  400 5 units  Over 400 Call the physician        METER DOWNLOAD SUMMARY: Date range evaluated: 10/27-11/26/19 Fingerstick Blood Glucose Tests = 69 Average Number Tests/Day = 2.2 Overall Mean FS Glucose = 254 Standard Deviation = 121  BG Ranges: Low = 50 High = 507    CONTINUOUS GLUCOSE MONITORING RECORD INTERPRETATION    Dates of Recording: 01/26/18-02/08/2018  Sensor description: CGM G5  Results statistics:   CGM use % of time 93  Average and SD 223/103  Time in range   38     %  % Time Above 180 60  % Time Below target 2    Glycemic patterns summary: Daytime highs Glucose management indicator 8.7%   Hypoglycemic episodes occurred mostly during the day after a bolus.   Overnight periods: minimal overnight hypoglycemia.     HISTORY:  Past Medical History:  Past Medical History:  Diagnosis Date  . Asthma   . Cerebellar ataxia ([place])   . Diabetes mellitus without complication ([place])   .  Glaucoma   . Hyperlipidemia   . Hypertension   . Pulmonary embolism ([place])   . Sleep apnea   . Spinal stenosis    Past Surgical History:  Past Surgical History:  Procedure Laterality Date  . ABDOMINAL HYSTERECTOMY    . ADENOIDECTOMY    . ANKLE SURGERY Left   . BILATERAL CARPAL TUNNEL RELEASE    . ELBOW SURGERY    . GASTRIC BYPASS  2014  . HAND NEUROPLASTY    . TONSILLECTOMY      Social History:  reports that she has never smoked. She has never used smokeless  tobacco. She reports that she does not drink alcohol or use drugs. Family History:  Family History  Problem Relation Age of Onset  . Cervical cancer Mother   . Dementia Father   . Retinal detachment Father   . Heart attack Father   . Diabetes Father   . Stroke Father   . Hypertension Father   . Glaucoma Paternal Grandfather      HOME MEDICATIONS: Allergies as of 02/07/2018      Reactions   Tylenol [acetaminophen] Other (See Comments)   Glucose monitor      Medication List        Accurate as of 02/07/18 11:06 AM. Always use your most recent med list.          albuterol 108 (90 Base) MCG/ACT inhaler Commonly known as:  PROVENTIL HFA;VENTOLIN HFA Inhale 1 puff into the lungs 2 (two) times daily.   atorvastatin 40 MG tablet Commonly known as:  LIPITOR Take 1 tablet (40 mg total) by mouth daily at 6 PM.   Calcium Citrate 250 MG Tabs Take 2 tablets (500 mg total) by mouth 2 (two) times daily.   fluticasone 220 MCG/ACT inhaler Commonly known as:  FLOVENT HFA Inhale 1 puff into the lungs 2 (two) times daily.   hydroxypropyl methylcellulose / hypromellose 2.5 % ophthalmic solution Commonly known as:  ISOPTO TEARS / GONIOVISC Place 1 drop into both eyes as needed for dry eyes.   ibuprofen 200 MG tablet Commonly known as:  ADVIL,MOTRIN Take 400 mg by mouth 2 (two) times daily.   insulin aspart 100 UNIT/ML FlexPen Commonly known as:  NOVOLOG Inject 5 Units into the skin 3 (three) times daily with meals. Max 30 units per day   Insulin Glargine 100 UNIT/ML Solostar Pen Commonly known as:  LANTUS Inject 16 Units into the skin at bedtime.   Insulin Pen Needle 31G X 5 MM Misc Four times a day   MICROLET LANCETS Misc Twice a day   Misc. Devices Misc by Does not apply route. C PAP   multivitamin capsule Take 4 capsules by mouth daily. Bariatric fusion   mycophenolate 500 MG tablet Commonly known as:  CELLCEPT Take 1,000 mg by mouth 2 (two) times daily.     oxybutynin 5 MG tablet Commonly known as:  DITROPAN Take 1 tablet (5 mg total) by mouth 2 (two) times daily.   PRIVIGEN IV Inject into the vein.   sertraline 100 MG tablet Commonly known as:  ZOLOFT Take 1 tablet (100 mg total) by mouth daily.   tiZANidine 2 MG tablet Commonly known as:  ZANAFLEX Take 2-4 mg by mouth at bedtime as needed for muscle spasms.   VALIUM 5 MG tablet Generic drug:  diazepam Take 1 tablet (5 mg total) by mouth 3 (three) times daily.   Vitamin D3 75 MCG (3000 UT) Tabs Take by mouth.  OBJECTIVE:   Vital Signs: BP 118/68   Pulse 64   Ht 5\' 10"  (1.778 m)   Wt 163 lb (73.9 kg)   SpO2 98%   BMI 23.39 kg/m   Wt Readings from Last 3 Encounters:  02/07/18 163 lb (73.9 kg)  01/17/18 163 lb (73.9 kg)  12/28/17 163 lb 6.4 oz (74.1 kg)     Exam: General: Pt appears well and is in NAD  Lungs: Clear with good BS bilat with no rales, rhonchi, or wheezes  Heart: RRR with normal S1 and S2 and no gallops; no murmurs; no rub  Abdomen: Normoactive bowel sounds, soft, nontender, without masses or organomegaly palpable  Extremities: No pretibial edema.  Neuro: MS is good with appropriate affect, pt is alert and Ox3     DATA REVIEWED:  Lab Results  Component Value Date   HGBA1C 12.1 (A) 01/17/2018   Lab Results  Component Value Date   CREATININE 1.00 12/06/2017       ASSESSMENT / PLAN / RECOMMENDATIONS:   1) Type 1 Diabetes Mellitus, poorly controlled, With hypoglycemia unawareness - Most recent A1c of 12.1 %. Goal A1c < 7.0 %.    Plan:  - In review of her CGM download, patient noted with severe hyperglycemia, this is either due to the fact that she forgets to bolus or that the bolus dose is insufficient. Pt assures me that she bolus before the meals at all times and does not bolus after a mea, but the CGM clearly shows post-prandial bolusing , as there's a steep drop in her sugars post-prandially which is only caused by insulin intake.  These are also the times that were followed by hypoglycemia.  - I am concerned that Joyce Brown is having some memory issues and does not recall certain things at all times. Today she forgot that I gave her the discharge instructions even though we went over the instructions together.  - I explained to her that bolusing after a meal and administering correctional insulin for a post-prandial glucose will increase her risk of hypoglycemia. - On the CGM, she was also noted to have better average sugars (last visit 308), on today's visit 223 mg/dL.  - She is also much happier with her Lantus, and will obtain PA for Lantus, I would avoid Tyler Aas for her at this time, due to hypoglycemia unawareness.    MEDICATIONS:  Decrease Lantus to 14 units   Increase Novolog to 7 units TID QAC  Correctional insulin to be added to meal time Novolog as below    Blood sugar before meal Number of units to inject  Less than 150 0 unit  151 -  200 1 units  201 -  250 2 units  251 -  300 3 units  301 -  350 4 units  351 -  400 5 units  Over 400 Call the physician      EDUCATION / INSTRUCTIONS:  BG monitoring instructions: Patient is instructed to check her blood sugars 2 times a day with CGM caliberation.  Call [facility] if: BG persistently < 70 or > 300. . I reviewed the Rule of 15 for the treatment of hypoglycemia in detail with the patient.    F/U in 4 weeks    Signed electronically by: Mack Guise, MD  [facility]  [address] [place], [place] [address] Phone: [phone] FAX: [phone]   CC: Lind Covert, [address] [place]  [place] [address] Phone: [phone]  Fax: [phone]  Return to Endocrinology clinic as below: Future Appointments  Date Time Provider [place]  02/14/2018  9:20 AM [facility] DIAG [place] 1 [facility]  02/14/2018  9:30 AM [facility] [place] 1  [facility]  02/20/2018  8:15 AM Velora Heckler [facility]  03/09/2018  9:30 AM Steffie Waggoner, Melanie Crazier, MD [facility] None  03/20/2018  1:30 PM Lind Covert, MD [facility]  04/18/2018 11:30 AM Kathrynn Ducking, MD [facility] None  05/05/2018  8:30 AM Marzetta Board, DPM [facility]

## 2018-02-13 DIAGNOSIS — G2582 Stiff-man syndrome: Secondary | ICD-10-CM | POA: Diagnosis not present

## 2018-02-14 ENCOUNTER — Ambulatory Visit
Admission: RE | Admit: 2018-02-14 | Discharge: 2018-02-14 | Disposition: A | Discharge status: Home / Self Care | Payer: Medicare Other | Source: Ambulatory Visit | Attending: Family Medicine | Admitting: Family Medicine

## 2018-02-14 ENCOUNTER — Telehealth: Payer: Self-pay | Admitting: Internal Medicine

## 2018-02-14 ENCOUNTER — Other Ambulatory Visit: Payer: Self-pay | Admitting: Family Medicine

## 2018-02-14 DIAGNOSIS — R921 Mammographic calcification found on diagnostic imaging of breast: Secondary | ICD-10-CM

## 2018-02-14 DIAGNOSIS — N6489 Other specified disorders of breast: Secondary | ICD-10-CM | POA: Diagnosis not present

## 2018-02-14 NOTE — Telephone Encounter (Signed)
error 

## 2018-02-17 ENCOUNTER — Ambulatory Visit
Admission: RE | Admit: 2018-02-17 | Discharge: 2018-02-17 | Disposition: A | Discharge status: Home / Self Care | Payer: Medicare Other | Source: Ambulatory Visit | Attending: Family Medicine | Admitting: Family Medicine

## 2018-02-17 DIAGNOSIS — R921 Mammographic calcification found on diagnostic imaging of breast: Secondary | ICD-10-CM | POA: Diagnosis not present

## 2018-02-17 DIAGNOSIS — N6489 Other specified disorders of breast: Secondary | ICD-10-CM | POA: Diagnosis not present

## 2018-02-20 ENCOUNTER — Ambulatory Visit: Payer: Medicare Other | Admitting: Orthotics

## 2018-02-20 DIAGNOSIS — E1065 Type 1 diabetes mellitus with hyperglycemia: Secondary | ICD-10-CM

## 2018-02-20 DIAGNOSIS — E108 Type 1 diabetes mellitus with unspecified complications: Principal | ICD-10-CM

## 2018-02-20 DIAGNOSIS — IMO0002 Reserved for concepts with insufficient information to code with codable children: Secondary | ICD-10-CM

## 2018-02-20 NOTE — Progress Notes (Signed)

## 2018-02-25 ENCOUNTER — Encounter: Payer: Self-pay | Admitting: Podiatry

## 2018-02-25 NOTE — Progress Notes (Signed)
Subjective: Joyce Brown presents today referred by Lind Covert, MD with diabetes and cc of painful, discolored, thick toenails which interfere with daily activities.  Pain is aggravated when wearing enclosed shoe gear. Pain is relieved with periodic professional debridement.  Ms. Lirette has written foot notes she presents to me on today.  She sees Dr.Keith Jannifer Franklin at [facility] since Sept 2019.  She has h/o fracture, sprained and strained left ankle due to left ankle instability.  She propels her wheelchair with her heels and builds up heel calluses.  Past Medical History:  Diagnosis Date  . Asthma   . Cerebellar ataxia ([place])   . Diabetes mellitus without complication ([place])   . Glaucoma   . Hyperlipidemia   . Hypertension   . Pulmonary embolism ([place])   . Sleep apnea   . Spinal stenosis     Patient Active Problem List   Diagnosis Date Noted  . Breast calcifications 12/28/2017  . Dry mouth 12/06/2017  . Fibromyalgia 12/06/2017  . History of gastric bypass 12/06/2017  . Spinal stenosis 12/06/2017  . Hypokalemia 12/06/2017  . Asthma 12/06/2017  . Cerebellar ataxia ([place])   . Type I diabetes mellitus with complication, uncontrolled ([place])     Past Surgical History:  Procedure Laterality Date  . ABDOMINAL HYSTERECTOMY    . ADENOIDECTOMY    . ANKLE SURGERY Left   . BILATERAL CARPAL TUNNEL RELEASE    . ELBOW SURGERY    . GASTRIC BYPASS  2014  . HAND NEUROPLASTY    . TONSILLECTOMY       Current Outpatient Medications:  .  albuterol (PROVENTIL HFA;VENTOLIN HFA) 108 (90 Base) MCG/ACT inhaler, Inhale 1 puff into the lungs 2 (two) times daily., Disp: , Rfl:  .  atorvastatin (LIPITOR) 40 MG tablet, Take 1 tablet (40 mg total) by mouth daily at 6 PM., Disp: 90 tablet, Rfl: 3 .  Calcium Citrate 250 MG TABS, Take 2 tablets (500 mg total) by mouth 2 (two) times daily., Disp: 120 tablet, Rfl: 11 .  fluticasone (FLOVENT HFA) 220 MCG/ACT inhaler, Inhale  1 puff into the lungs 2 (two) times daily., Disp: , Rfl:  .  hydroxypropyl methylcellulose / hypromellose (ISOPTO TEARS / GONIOVISC) 2.5 % ophthalmic solution, Place 1 drop into both eyes as needed for dry eyes., Disp: , Rfl:  .  ibuprofen (ADVIL,MOTRIN) 200 MG tablet, Take 400 mg by mouth 2 (two) times daily., Disp: , Rfl:  .  Immune Globulin, Human, (PRIVIGEN IV), Inject into the vein., Disp: , Rfl:  .  insulin aspart (NOVOLOG) 100 UNIT/ML FlexPen, Inject 5 Units into the skin 3 (three) times daily with meals. Max 30 units per day, Disp: 15 mL, Rfl: 11 .  Insulin Glargine (LANTUS SOLOSTAR) 100 UNIT/ML Solostar Pen, Inject 16 Units into the skin at bedtime., Disp: 5 pen, Rfl: PRN .  Insulin Pen Needle (B-D UF III MINI PEN NEEDLES) 31G X 5 MM MISC, Four times a day, Disp: 150 each, Rfl: 11 .  Misc. Devices MISC, by Does not apply route. C PAP, Disp: , Rfl:  .  Multiple Vitamin (MULTIVITAMIN) capsule, Take 4 capsules by mouth daily. Bariatric fusion , Disp: , Rfl:  .  mycophenolate (CELLCEPT) 500 MG tablet, Take 1,000 mg by mouth 2 (two) times daily., Disp: , Rfl:  .  oxybutynin (DITROPAN) 5 MG tablet, Take 1 tablet (5 mg total) by mouth 2 (two) times daily., Disp: 180 tablet, Rfl: 1 .  sertraline (ZOLOFT) 100 MG tablet,  Take 1 tablet (100 mg total) by mouth daily., Disp: 90 tablet, Rfl: 1 .  tiZANidine (ZANAFLEX) 2 MG tablet, Take 2-4 mg by mouth at bedtime as needed for muscle spasms., Disp: , Rfl:  .  VALIUM 5 MG tablet, Take 1 tablet (5 mg total) by mouth 3 (three) times daily., Disp: 90 tablet, Rfl: 2 .  Cholecalciferol (VITAMIN D3) 75 MCG (3000 UT) TABS, Take by mouth., Disp: , Rfl:  .  MICROLET LANCETS MISC, Twice a day, Disp: 100 each, Rfl: 11  Allergies  Allergen Reactions  . Tylenol [Acetaminophen] Other (See Comments)    Glucose monitor    Social History   Occupational History  . Not on file  Tobacco Use  . Smoking status: Never Smoker  . Smokeless tobacco: Never Used   Substance and Sexual Activity  . Alcohol use: Never    Frequency: Never  . Drug use: Never  . Sexual activity: Not on file    Family History  Problem Relation Age of Onset  . Cervical cancer Mother   . Dementia Father   . Retinal detachment Father   . Heart attack Father   . Diabetes Father   . Stroke Father   . Hypertension Father   . Glaucoma Paternal Grandfather     Immunization History  Administered Date(s) Administered  . Influenza,inj,Quad PF,6+ Mos 12/06/2017  . Pneumococcal Polysaccharide-23 07/22/2016  . Tdap 11/23/2013  . Zoster Recombinat (Shingrix) 07/21/2017, 11/04/2017    Review of systems: Positive Findings in bold print.  Constitutional:  chills, fatigue, fever, sweats, weight change Communication: Optometrist, sign Ecologist, hand writing, iPad/Android device Eyes: diplopia, glare,  light sensitivity, eyeglasses, blindness Ears nose mouth throat: Hard of hearing, deaf, sign language,  vertigo,   bloody nose,  rhinitis,  cold sores, snoring Cardiovascular: HTN, edema, arrhythmia, pacemaker in place, defibrillator in place,  chest pain/tightness, chronic anticoagulation, blood clot Respiratory:  difficulty breathing, denies congestion, SOB, wheezing, cough Gastrointestinal: abdominal pain, diarrhea, nausea, vomiting,  Genitourinary:  nocturia,  pain on urination,  blood in urine, Foley catheter, urinary urgency Musculoskeletal: Uses mobility aid,  cramping, stiff joints, painful joints,  Skin: +changes in toenails, color change dryness, itchy skin, mole changes, or rash  Neurological: numbness, paresthesias, burning in feet, denies fainting, seizure, change in speech. denies headaches, memory problems/poor historian, cerebral palsy, lower extremity weakness Endocrine: diabetes, hypothyroidism, hyperthyroidism,  dry mouth, flushing, denies heat intolerance, cold intolerance, excessive thirst, denies polyuria, nocturia Hematological:  easy bleeding,   excessive bleeding, easy bruising, enlarged lymph nodes, on long term blood thinner Allergy/immunological:  hives, frequent infections, multiple drug allergies, seasonal allergies,  Psychiatric:  anxiety, depression, mood disorder, suicidal ideations, hallucinations   Objective: Vascular Examination: Capillary refill time immediate x 10 digits Dorsalis pedis and posterior tibial pulses present b/l No digital hair x 10 digits Skin temperature gradient WNL b/l  Dermatological Examination: Skin with normal turgor, texture and tone b/l  Toenails 1-5 b/l discolored, thick, dystrophic with subungual debris and pain with palpation to nailbeds due to thickness of nails.   Hyperkeratotic lesion plantar heel b/l  Musculoskeletal: Muscle strength 5/5 to all LE muscle groups  HAV with bunion b/l  Hammertoe deformity 2-5 b/l  Tailor's bunion b/l  Neurological: Sensation intact with 10 gram monofilament Vibratory sensation intact.  Assessment: 1. Painful onychomycosis toenails 1-5 b/l  2. Calluses b/l heels 3. HAV with bunion b/l 4. Tailor's bunion deformity b/l 5. NIDDM  Plan: 1. Discussed diabetic foot care principles. Literature dispensed  on today. 2. Toenails 1-5 b/l were debrided in length and girth without iatrogenic bleeding. 3. Calluses pared b/l heels. 4. Patient qualifies for diabetic shoes based on examination of above diagnoses. Diabetic shoes will be ordered. 5. Patient to continue soft, supportive shoe gear 6. Patient to report any pedal injuries to medical professional immediately. 7. Follow up 3 months. Patient/POA to call should there be a concern in the interim.

## 2018-02-27 DIAGNOSIS — G2582 Stiff-man syndrome: Secondary | ICD-10-CM | POA: Diagnosis not present

## 2018-02-28 ENCOUNTER — Telehealth: Payer: Self-pay | Admitting: Internal Medicine

## 2018-02-28 NOTE — Telephone Encounter (Signed)
Pt was made aware prior Authorization of Lantus Solostar was approved and coveraged from 12/13/2017-02/10/2019. Pt was notified script can be pick up from pharmacy at any time. Pt  Expressed understanding.

## 2018-02-28 NOTE — Telephone Encounter (Signed)
Spoke with Smitty Cords at [employer] and provided DX code for medical supplies whic went thru for coverage and did not need a PA per [place].

## 2018-02-28 NOTE — Telephone Encounter (Signed)
Pharmacy called to advise that they need diagnostic code for Rx sent to them for medical supplies   Call - [phone]

## 2018-02-28 NOTE — Telephone Encounter (Signed)
Patient stated was returning a call she received yesterday from [place]  Her call back number is [phone]

## 2018-03-09 ENCOUNTER — Telehealth: Payer: Self-pay | Admitting: Neurology

## 2018-03-09 ENCOUNTER — Ambulatory Visit (INDEPENDENT_AMBULATORY_CARE_PROVIDER_SITE_OTHER): Payer: Medicare Other | Admitting: Internal Medicine

## 2018-03-09 ENCOUNTER — Encounter: Payer: Self-pay | Admitting: Family Medicine

## 2018-03-09 ENCOUNTER — Encounter: Payer: Self-pay | Admitting: Internal Medicine

## 2018-03-09 VITALS — BP 120/68 | HR 57 | Ht 70.0 in | Wt 160.8 lb

## 2018-03-09 DIAGNOSIS — E1065 Type 1 diabetes mellitus with hyperglycemia: Secondary | ICD-10-CM | POA: Diagnosis not present

## 2018-03-09 LAB — GLUCOSE, POCT (MANUAL RESULT ENTRY): POC Glucose: 70 mg/dl (ref 70–99)

## 2018-03-09 NOTE — Telephone Encounter (Signed)
[facility] asked to be connected to the  intrafusion suite, call transferred

## 2018-03-09 NOTE — Progress Notes (Signed)
Name: Joyce Brown  Age/ Sex: 67 y.o., female   MRN/ DOB: [id number], 14-Nov-1950     PCP: Lind Covert, MD   Reason for Endocrinology Evaluation: Type 1 Diabetes Mellitus  Initial Endocrine Consultative Visit: 01/17/2018    PATIENT IDENTIFIER: Joyce Brown is a 67 y.o. female with a past medical history of T1DM, cerebellar atrophy, asthma, Hx of gastric bypass, OSA on CPAP and Hx of gastric bypass in 2014  . The patient has followed with Endocrinology clinic since 01/17/18 for consultative assistance with management of her diabetes.  Patient moved from [place] in September, 2019 to be close to her sister. She has been diagnosed with stiff person Syndrome but Dr. Jannifer Franklin this may be more of cerebellar atrophy. She is on IVIG at this time.   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 She has only had one DKA episode which was in 11/2017. Marland Kitchen Her hemoglobin A1c has ranged from 8.4% in 01/2017, peaking at 12.1 % in 01/2016  SUBJECTIVE:   During the last visit (02/07/18):We decreased Lantus to 14 units and INcreased Novolog to 7 units. Pt continued with fluctuating BG's alternating between hypoglycemia and severe hyperglycemia ranging between 50-500 mg/dL     Today (03/09/2018): Joyce Brown is here for a 4 week follow up.  She checks her blood sugars multiple times daily, using  CGM multiple times a day. The patient has had hypoglycemic episodes since the last clinic visit, which typically occur 1 x / week- most often occuring during the day . The patient is not symptomatic with these episodes. Otherwise, the patient hasnot required any recent emergency interventions for hypoglycemia and hasnot had recent hospitalizations secondary to hyper or hypoglycemic  episodes.    ROS: As per HPI and as detailed below: Review of Systems  Constitutional: Negative for fever and weight loss.  Respiratory: Negative for cough and shortness of breath.   Cardiovascular: Negative for chest pain and palpitations.  Gastrointestinal: Negative for diarrhea and nausea.      HOME DIABETES REGIMEN:  Lantus 14 units QHS Novolog 7 units tid with meals    Blood sugar before meal Number of units to inject  Less than 150 0 unit  151 -  200 1 units  201 -  250 2 units  251 -  300 3 units  301 -  350 4 units  351 -  400 5 units  Over 400 Call the physician       METER DOWNLOAD SUMMARY: Date range evaluated: 11/27-12/26/19 Fingerstick Blood Glucose Tests = 74 Average Number Tests/Day = 2.5 Overall Mean FS Glucose = 233 Standard Deviation = 126  BG Ranges: Low = 49 High = high    CONTINUOUS GLUCOSE MONITORING RECORD INTERPRETATION    Dates of Recording: 11/28-12/27/19  Sensor description: CGM G5  Results statistics:   CGM use % of time 83  Average and SD 229/100  Time in range   36   %  % Time Above 180 63  % Time Below target 1    Glycemic patterns summary: Daytime highs Glucose management indicator 8.8%   Hypoglycemic episodes occurred mostly during the day after a bolus.   Overnight periods:Some overnight hypoglycemia.     HISTORY:  Past Medical History:  Past Medical History:  Diagnosis Date  . Asthma   . Cerebellar ataxia ([place])   .  Diabetes mellitus without complication ([place])   . Glaucoma   . Hyperlipidemia   . Hypertension   . Pulmonary embolism ([place])   . Sleep apnea   . Spinal stenosis    Past Surgical History:  Past Surgical History:  Procedure Laterality Date  . ABDOMINAL HYSTERECTOMY    . ADENOIDECTOMY    . ANKLE SURGERY Left   . BILATERAL CARPAL TUNNEL RELEASE    . ELBOW SURGERY    . GASTRIC BYPASS  2014  . HAND NEUROPLASTY    . TONSILLECTOMY      Social History:  reports that she has never  smoked. She has never used smokeless tobacco. She reports that she does not drink alcohol or use drugs. Family History:  Family History  Problem Relation Age of Onset  . Cervical cancer Mother   . Dementia Father   . Retinal detachment Father   . Heart attack Father   . Diabetes Father   . Stroke Father   . Hypertension Father   . Glaucoma Paternal Grandfather      HOME MEDICATIONS: Allergies as of 03/09/2018      Reactions   Tylenol [acetaminophen] Other (See Comments)   Glucose monitor      Medication List       Accurate as of March 09, 2018 12:27 PM. Always use your most recent med list.        albuterol 108 (90 Base) MCG/ACT inhaler Commonly known as:  PROVENTIL HFA;VENTOLIN HFA Inhale 1 puff into the lungs 2 (two) times daily.   atorvastatin 40 MG tablet Commonly known as:  LIPITOR Take 1 tablet (40 mg total) by mouth daily at 6 PM.   Calcium Citrate 250 MG Tabs Take 2 tablets (500 mg total) by mouth 2 (two) times daily.   fluticasone 220 MCG/ACT inhaler Commonly known as:  FLOVENT HFA Inhale 1 puff into the lungs 2 (two) times daily.   hydroxypropyl methylcellulose / hypromellose 2.5 % ophthalmic solution Commonly known as:  ISOPTO TEARS / GONIOVISC Place 1 drop into both eyes as needed for dry eyes.   ibuprofen 200 MG tablet Commonly known as:  ADVIL,MOTRIN Take 400 mg by mouth 2 (two) times daily.   insulin aspart 100 UNIT/ML FlexPen Commonly known as:  NOVOLOG Inject 5 Units into the skin 3 (three) times daily with meals. Max 30 units per day   Insulin Glargine 100 UNIT/ML Solostar Pen Commonly known as:  LANTUS SOLOSTAR Inject 16 Units into the skin at bedtime.   Insulin Pen Needle 31G X 5 MM Misc Commonly known as:  B-D UF III MINI PEN NEEDLES Four times a day   MICROLET LANCETS Misc Twice a day   Misc. Devices Misc by Does not apply route. C PAP   multivitamin capsule Take 4 capsules by mouth daily. Bariatric fusion     mycophenolate 500 MG tablet Commonly known as:  CELLCEPT Take 1,000 mg by mouth 2 (two) times daily.   oxybutynin 5 MG tablet Commonly known as:  DITROPAN Take 1 tablet (5 mg total) by mouth 2 (two) times daily.   PRIVIGEN IV Inject into the vein.   sertraline 100 MG tablet Commonly known as:  ZOLOFT Take 1 tablet (100 mg total) by mouth daily.   tiZANidine 2 MG tablet Commonly known as:  ZANAFLEX Take 2-4 mg by mouth at bedtime as needed for muscle spasms.   VALIUM 5 MG tablet Generic drug:  diazepam Take 1 tablet (5 mg total) by mouth 3 (  three) times daily.   Vitamin D3 75 MCG (3000 UT) Tabs Take by mouth.        OBJECTIVE:   Vital Signs: BP 120/68 (BP Location: Left Arm, Patient Position: Sitting, Cuff Size: Normal)   Pulse (!) 57   Ht 5\' 10"  (1.778 m)   Wt 160 lb 12.8 oz (72.9 kg)   SpO2 98%   BMI 23.07 kg/m   Wt Readings from Last 3 Encounters:  03/09/18 160 lb 12.8 oz (72.9 kg)  02/07/18 163 lb (73.9 kg)  01/17/18 163 lb (73.9 kg)     Exam: General: Pt appears well and is in NAD  Lungs: Clear with good BS bilat with no rales, rhonchi, or wheezes  Heart: RRR with normal S1 and S2 and no gallops; no murmurs; no rub  Abdomen: Normoactive bowel sounds, soft, nontender, without masses or organomegaly palpable  Extremities: No pretibial edema.  Neuro: MS is good with appropriate affect, pt is alert and Ox3     DATA REVIEWED:  Lab Results  Component Value Date   HGBA1C 12.1 (A) 01/17/2018   Lab Results  Component Value Date   MICROALBUR 2.3 (H) 02/07/2018   CREATININE 1.00 12/06/2017       ASSESSMENT / PLAN / RECOMMENDATIONS:   1) Type 1 Diabetes Mellitus, poorly controlled, With hypoglycemia unawareness - Most recent A1c of 12.1 %. Goal A1c < 7.5 %.  Without hypoglycemia   Plan:  - In review of her CGM download, patient noted with severe hyperglycemia, especially as the day progresses. But she was also noted to have hypoglycemia during the  day or overnight. Pt assured me she took Novolog before she eats and she never boluses for post-prandial readings - Given the overnight drastic glucose drops, I will reduce her lantus as below - She did have a hypoglycemic episode here in the office, she ate breakfast and took 7 units of Novolog. CGM indicated 55 mg/dL, but a finger stick was 70 mg/dL . Did not have any symptoms.  - We followed the rule of 15 for hypoglycemia and her glucose within 15 minutes was 135 mg/dL  - Pt was not using correctional insulin pre-meals, we did discuss that again today.   MEDICATIONS:  Decrease Lantus to 12 units   Change NOvolgo to 6 units with breakgfast and 8 units with lunch and supper   Correctional insulin to be added to meal time Novolog as below     Blood sugar before meal Number of units to inject  Less than 200 0 unit  201 -  250 1 units  251 -  300 2 units  301 -  350 3 units  351 -  400 4 units  Over 400 Call the physician      EDUCATION / INSTRUCTIONS:  BG monitoring instructions: Patient is instructed to check her blood sugars 2 times a day with CGM caliberation.  Call [facility] if: BG persistently < 70 or > 300. . I reviewed the Rule of 15 for the treatment of hypoglycemia in detail with the patient.    F/U in 8 weeks    Signed electronically by: Mack Guise, MD  [facility]  [facility] [address] [place], [place] [address] Phone: [phone] FAX: [phone]   CC: Lind Covert, [place] [address] Phone: [phone]  Fax: [phone]  Return to Endocrinology clinic as below: Future Appointments  Date Time Provider [place]  03/20/2018  1:30 PM Lind Covert, MD [facility]  04/18/2018 11:30 AM Kathrynn Ducking, MD [facility] None  05/04/2018  9:10 AM Antawan Mchugh, Melanie Crazier, MD [facility] None  05/05/2018  8:30 AM Marzetta Board, DPM [facility]

## 2018-03-09 NOTE — Patient Instructions (Addendum)
-   Decrease Lantus to 12 units daily   - Novolog 6 units with Breakfast  - Novolog 8 units with Lunch and Supper         - Novolog correctional insulin: ADD extra units of insulin to your meal-time Novolog  dose if your blood sugars are higher than . Use the scale below to help guide you:   Blood sugar before meal Number of units to inject  Less than 200 0 unit  201 -  250 1 units  251 -  300 2 units  301 -  350 3 units  351 -  400 4 units  Over 400 Call the physician

## 2018-03-10 ENCOUNTER — Encounter: Payer: Self-pay | Admitting: Family Medicine

## 2018-03-13 ENCOUNTER — Telehealth: Payer: Self-pay

## 2018-03-13 DIAGNOSIS — G2582 Stiff-man syndrome: Secondary | ICD-10-CM | POA: Diagnosis not present

## 2018-03-13 NOTE — Telephone Encounter (Signed)
Received fax from [facility] Privigen SDV 10 mg/vL was shipped on 03/09/2018. MB RN

## 2018-03-20 ENCOUNTER — Encounter: Payer: Self-pay | Admitting: Family Medicine

## 2018-03-20 ENCOUNTER — Ambulatory Visit (INDEPENDENT_AMBULATORY_CARE_PROVIDER_SITE_OTHER): Payer: Medicare Other | Admitting: Family Medicine

## 2018-03-20 DIAGNOSIS — Z23 Encounter for immunization: Secondary | ICD-10-CM

## 2018-03-20 DIAGNOSIS — F329 Major depressive disorder, single episode, unspecified: Secondary | ICD-10-CM

## 2018-03-20 DIAGNOSIS — J4540 Moderate persistent asthma, uncomplicated: Secondary | ICD-10-CM

## 2018-03-20 DIAGNOSIS — F32A Depression, unspecified: Secondary | ICD-10-CM | POA: Insufficient documentation

## 2018-03-20 MED ORDER — FLUTICASONE PROPIONATE HFA 220 MCG/ACT IN AERO: 1.0000 | INHALATION_SPRAY | Freq: Two times a day (BID) | RESPIRATORY_TRACT | 4 refills | Status: DC

## 2018-03-20 MED ORDER — ALBUTEROL SULFATE HFA 108 (90 BASE) MCG/ACT IN AERS: 1.0000 | INHALATION_SPRAY | Freq: Two times a day (BID) | RESPIRATORY_TRACT | 2 refills | Status: DC

## 2018-03-20 NOTE — Assessment & Plan Note (Signed)
>>  ASSESSMENT AND PLAN FOR ASTHMA WRITTEN ON 03/20/2018  3:41 PM BY Neave Lenger L, MD  Well controlled

## 2018-03-20 NOTE — Assessment & Plan Note (Signed)
Well controlled 

## 2018-03-20 NOTE — Assessment & Plan Note (Signed)
Stable She feels she is doing well

## 2018-03-20 NOTE — Addendum Note (Signed)
Addended by: Talbert Cage L on: 03/20/2018 05:00 PM   Modules accepted: Orders

## 2018-03-20 NOTE — Progress Notes (Signed)
Subjective  Joyce Brown is a 68 y.o. female is presenting with the following  DEPRESSION Disease Monitoring Current symptoms include feels well            Symptoms have been controlled     Is Exercising yes  Evidence of suicidal ideation: no  Medication Monitoring Compliance: taking as prescribed Decreased Libido no      Lightheadedness no     Insomnia no  GI symptoms no  ROS - See HPI  ASTHMA Disease monitoring Patient estimate of Control: good  Night time symptoms (coughing or wheezing): no Exercise symptoms (coughing or wheezing): no Asthma affect on activities:   no Last ER or hospital visit  years  Medication Management Frequency of rescue inhaler (albuterol): less than monthly Frequency of preventer inhalers daily Mouth soreness: no   COCCYX Pain For last few days.  Has on and off for years.  Using all her cushions.  No skin breakdown or fevers or redness.  Does not want to be examined today    Review of Symptoms - see HPI PMH - Smoking status noted.    PMH Previous treatment includes: none   Chief Complaint noted Review of Symptoms - see HPI PMH - Smoking status noted.    Objective Vital Signs reviewed BP 138/72   Pulse (!) 58   Temp 97.8 F (36.6 C) (Oral)   Wt 161 lb 9.6 oz (73.3 kg)   SpO2 98%   BMI 23.19 kg/m  Heart - Regular rate and rhythm.  No murmurs, gallops or rubs.    Lungs:  Normal respiratory effort, chest expands symmetrically. Lungs are clear to auscultation, no crackles or wheezes. Extremities:  No cyanosis, edema, or deformity noted with good range of motion of all major joints.    Patient in wheelchair  Assessments/Plans  See after visit summary for details of patient instuctions  Asthma Well controlled  Depression Stable She feels she is doing well   Coccyx pain - she does not wish to have examined and feels will improve on its own.  See after visit summary

## 2018-03-20 NOTE — Patient Instructions (Signed)
Great to see you  If the coccyx is bothering you more let us know.  I will send in the Rx for albuterol and flovent  Please let me know about your colonoscopy  Have a great trip to [facility]

## 2018-03-23 ENCOUNTER — Telehealth: Payer: Self-pay | Admitting: Internal Medicine

## 2018-03-23 MED ORDER — BASAGLAR KWIKPEN 100 UNIT/ML ~~LOC~~ SOPN: 12.0000 [IU] | PEN_INJECTOR | Freq: Every day | SUBCUTANEOUS | 11 refills | Status: DC

## 2018-03-23 NOTE — Telephone Encounter (Signed)
Patient is returning call. Please Advise, thanks

## 2018-03-23 NOTE — Telephone Encounter (Signed)
Patient called and stated Basaglar would be covered instead of Lantus.   Please advise.

## 2018-03-23 NOTE — Telephone Encounter (Signed)
Patient is stating Lantus is too expensive and she needs an alternative, Please Advise, thanks

## 2018-03-23 NOTE — Addendum Note (Signed)
Addended by: Dorita Sciara on: 03/23/2018 03:43 PM   Modules accepted: Orders

## 2018-03-23 NOTE — Telephone Encounter (Signed)
Patient call returned and was informed to [employer] and inquire about alternative covered. Waiting for call back.

## 2018-04-03 ENCOUNTER — Telehealth: Payer: Self-pay

## 2018-04-03 NOTE — Telephone Encounter (Signed)
Received fax from [facility] stating Privigen has been shipped for the pt. Shipping date 03/30/18.

## 2018-04-04 ENCOUNTER — Encounter: Payer: Self-pay | Admitting: Family Medicine

## 2018-04-04 DIAGNOSIS — H40013 Open angle with borderline findings, low risk, bilateral: Secondary | ICD-10-CM | POA: Diagnosis not present

## 2018-04-04 DIAGNOSIS — H2513 Age-related nuclear cataract, bilateral: Secondary | ICD-10-CM | POA: Diagnosis not present

## 2018-04-04 DIAGNOSIS — E119 Type 2 diabetes mellitus without complications: Secondary | ICD-10-CM | POA: Diagnosis not present

## 2018-04-04 DIAGNOSIS — H25013 Cortical age-related cataract, bilateral: Secondary | ICD-10-CM | POA: Diagnosis not present

## 2018-04-04 LAB — HM DIABETES EYE EXAM

## 2018-04-05 ENCOUNTER — Other Ambulatory Visit: Payer: Self-pay | Admitting: Family Medicine

## 2018-04-05 DIAGNOSIS — Z1231 Encounter for screening mammogram for malignant neoplasm of breast: Secondary | ICD-10-CM

## 2018-04-18 ENCOUNTER — Encounter: Payer: Self-pay | Admitting: Neurology

## 2018-04-18 ENCOUNTER — Ambulatory Visit (INDEPENDENT_AMBULATORY_CARE_PROVIDER_SITE_OTHER): Payer: Medicare Other | Admitting: Neurology

## 2018-04-18 VITALS — BP 122/67 | HR 76 | Ht 70.0 in | Wt 161.3 lb

## 2018-04-18 DIAGNOSIS — G4733 Obstructive sleep apnea (adult) (pediatric): Secondary | ICD-10-CM

## 2018-04-18 DIAGNOSIS — M25552 Pain in left hip: Secondary | ICD-10-CM

## 2018-04-18 DIAGNOSIS — G119 Hereditary ataxia, unspecified: Secondary | ICD-10-CM

## 2018-04-18 DIAGNOSIS — Z5181 Encounter for therapeutic drug level monitoring: Secondary | ICD-10-CM

## 2018-04-18 DIAGNOSIS — G2582 Stiff-man syndrome: Secondary | ICD-10-CM | POA: Diagnosis not present

## 2018-04-18 MED ORDER — MYCOPHENOLATE MOFETIL 500 MG PO TABS: 1000.0000 mg | ORAL_TABLET | Freq: Two times a day (BID) | ORAL | 1 refills | Status: DC

## 2018-04-18 NOTE — Progress Notes (Signed)
Reason for visit: Progressive cerebellar syndrome  Joyce Brown is an 68 y.o. 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  She had a level of 25,000 on the blood test.  The patient had been getting IVIG therapies, she stopped this 1 month ago, she has a Port-A-Cath in place.  The patient is being tapered slowly off her diazepam, she has had some improvement in her slurred speech coming off the medication.  The patient has severe gait disorder, she has not had any falls since last seen.  She is on CPAP at night, she will require a local sleep physician here.  The patient denies any difficulty with choking, she does have some double vision at times but has prisms that seem to help.  The patient returns to this office for an evaluation.   Past Medical History:  Diagnosis Date  . Asthma   . Cerebellar ataxia ([place])   . Diabetes mellitus without complication ([place])   . Glaucoma   . Hyperlipidemia   . Hypertension   . Pulmonary embolism ([place])   . Sleep apnea   . Spinal stenosis     Past Surgical History:  Procedure Laterality Date  . ABDOMINAL HYSTERECTOMY    . ADENOIDECTOMY    . ANKLE SURGERY Left   . BILATERAL CARPAL TUNNEL RELEASE    . ELBOW SURGERY    . GASTRIC BYPASS  2014  . HAND NEUROPLASTY    . TONSILLECTOMY      Family History  Problem Relation Age of Onset  . Cervical cancer Mother   . Dementia Father   . Retinal detachment Father   . Heart attack Father   . Diabetes Father   . Stroke Father   . Hypertension Father   . Glaucoma Paternal Grandfather     Social history:  reports that she has never smoked. She has never used smokeless tobacco. She reports that she does not drink alcohol or use drugs.    Allergies  Allergen Reactions  . Tylenol [Acetaminophen] Other (See Comments)    Glucose monitor    Medications:  Prior to Admission medications   Medication Sig Start Date End Date Taking? Authorizing Provider  albuterol (PROVENTIL HFA;VENTOLIN HFA) 108 (90 Base) MCG/ACT inhaler Inhale 1 puff into the lungs 2 (two) times daily. 03/20/18  Yes Lind Covert, MD  atorvastatin (LIPITOR) 40 MG tablet Take 1 tablet (40 mg total) by mouth daily at 6 PM. 12/28/17  Yes Chambliss, Jeb Levering, MD  Calcium Citrate 250 MG TABS Take 2 tablets (500 mg total) by mouth 2 (two) times daily. 01/17/18  Yes Shamleffer, Melanie Crazier, MD  Cholecalciferol (VITAMIN D3) 75 MCG (3000 UT) TABS Take by mouth.   Yes [provider]  fluticasone (FLOVENT HFA) 220 MCG/ACT inhaler Inhale 1 puff into the lungs 2 (two) times daily. 03/20/18  Yes Chambliss, Jeb Levering, MD  ibuprofen (ADVIL,MOTRIN) 200 MG tablet Take 400 mg by mouth 2 (two) times daily.   Yes [provider]  insulin aspart (NOVOLOG) 100 UNIT/ML FlexPen Inject 5 Units into the skin 3 (three) times daily with meals. Max 30 units per day 01/17/18  Yes Shamleffer, Melanie Crazier, MD  Insulin Glargine (BASAGLAR KWIKPEN) 100 UNIT/ML SOPN Inject 0.12 mLs (12 Units total) into the skin daily. 03/23/18  Yes Shamleffer, Melanie Crazier, MD  Insulin  Pen Needle (B-D UF III MINI PEN NEEDLES) 31G X 5 MM MISC Four times a day 01/17/18  Yes Shamleffer, Melanie Crazier, MD  MICROLET LANCETS MISC Twice a day 02/07/18  Yes Shamleffer, Melanie Crazier, MD  Misc. Devices MISC by Does not apply route. C PAP   Yes [provider]  Multiple Vitamin (MULTIVITAMIN) capsule Take 4 capsules by mouth daily. Bariatric fusion    Yes [provider]  mycophenolate (CELLCEPT) 500 MG tablet Take 1,000 mg by mouth 2 (two) times daily.   Yes [provider]  oxybutynin (DITROPAN) 5 MG tablet Take 1 tablet (5 mg total) by mouth 2 (two) times daily. 01/24/18  Yes Lind Covert, MD  sertraline (ZOLOFT) 100 MG tablet Take 1  tablet (100 mg total) by mouth daily. 01/24/18  Yes Chambliss, Jeb Levering, MD  VALIUM 5 MG tablet Take 1 tablet (5 mg total) by mouth 3 (three) times daily. 01/19/18  Yes Kathrynn Ducking, MD  Immune Globulin, Human, (PRIVIGEN IV) Inject into the vein.    [provider]  tiZANidine (ZANAFLEX) 2 MG tablet Take 2-4 mg by mouth at bedtime as needed for muscle spasms.    [provider]    ROS:  Out of a complete 14 system review of symptoms, the patient complains only of the following symptoms, and all other reviewed systems are negative.  Sleep apnea Incontinence of bladder Walking difficulty Moles Speech difficulty, weakness Depression  Blood pressure 122/67, pulse 76, height 5\' 10"  (1.778 m), weight 161 lb 5 oz (73.2 kg).  Physical Exam  General: The patient is alert and cooperative at the time of the examination.  Skin: No significant peripheral edema is noted.   Neurologic Exam  Mental status: The patient is alert and oriented x 3 at the time of the examination. The patient has apparent normal recent and remote memory, with an apparently normal attention span and concentration ability.   Cranial nerves: Facial symmetry is present. Speech is ataxic, slightly dysarthric, not aphasic. Extraocular movements are full. Visual fields are full.  Motor: The patient has good strength in all 4 extremities.  Sensory examination: Soft touch sensation is symmetric on the face, arms, and legs.  Coordination: The patient has mild dysmetria with finger-nose-finger, more significant with heel-to-shin bilaterally.  Gait and station: The patient has a wide-based, ataxic gait, the patient requires assistance with walking.  Reflexes: Deep tendon reflexes are symmetric, but are slightly depressed.   Assessment/Plan:  1.  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  Severe gait disorder  The patient will remain on CellCept for now, she has come  off of IVIG, we will keep the Port-A-Cath in place for now, if she continues to do well for 2 or 3 months off of IVIG, we will have the Port-A-Cath removed.  The patient will continue the taper of the diazepam, she will go to 2.5 mg twice daily for 2 months and then stop.  A prescription was sent in for the CellCept, she will have blood work done today.  She will follow-up in 6 months.  She will be sent for a sleep evaluation, she requires a sleep physician following her move to this area.  Jill Alexanders MD 04/18/2018 11:37 AM  [facility] [address] [place], [place] [address]  Phone [phone] Fax [phone]

## 2018-04-19 LAB — CBC WITH DIFFERENTIAL/PLATELET
Basophils Absolute: 0.1 10*3/uL (ref 0.0–0.2)
Basos: 1 %
EOS (ABSOLUTE): 0.1 10*3/uL (ref 0.0–0.4)
Eos: 3 %
HEMOGLOBIN: 11.7 g/dL (ref 11.1–15.9)
Hematocrit: 37 % (ref 34.0–46.6)
Immature Grans (Abs): 0 10*3/uL (ref 0.0–0.1)
Immature Granulocytes: 0 %
LYMPHS: 29 %
Lymphocytes Absolute: 1.2 10*3/uL (ref 0.7–3.1)
MCH: 29.2 pg (ref 26.6–33.0)
MCHC: 31.6 g/dL (ref 31.5–35.7)
MCV: 92 fL (ref 79–97)
MONOCYTES: 9 %
Monocytes Absolute: 0.4 10*3/uL (ref 0.1–0.9)
Neutrophils Absolute: 2.3 10*3/uL (ref 1.4–7.0)
Neutrophils: 58 %
Platelets: 208 10*3/uL (ref 150–450)
RBC: 4.01 x10E6/uL (ref 3.77–5.28)
RDW: 13.8 % (ref 11.7–15.4)
WBC: 4.1 10*3/uL (ref 3.4–10.8)

## 2018-04-19 LAB — COMPREHENSIVE METABOLIC PANEL
ALT: 36 IU/L — AB (ref 0–32)
AST: 31 IU/L (ref 0–40)
Albumin/Globulin Ratio: 1.8 (ref 1.2–2.2)
Albumin: 3.9 g/dL (ref 3.8–4.8)
Alkaline Phosphatase: 69 IU/L (ref 39–117)
BUN/Creatinine Ratio: 21 (ref 12–28)
BUN: 19 mg/dL (ref 8–27)
Bilirubin Total: 0.3 mg/dL (ref 0.0–1.2)
CO2: 23 mmol/L (ref 20–29)
Calcium: 9.5 mg/dL (ref 8.7–10.3)
Chloride: 103 mmol/L (ref 96–106)
Creatinine, Ser: 0.92 mg/dL (ref 0.57–1.00)
GFR calc Af Amer: 76 mL/min/{1.73_m2} (ref >59)
GFR calc non Af Amer: 66 mL/min/{1.73_m2} (ref >59)
Globulin, Total: 2.2 g/dL (ref 1.5–4.5)
Glucose: 320 mg/dL — ABNORMAL HIGH (ref 65–99)
Potassium: 5.4 mmol/L — ABNORMAL HIGH (ref 3.5–5.2)
Sodium: 141 mmol/L (ref 134–144)
Total Protein: 6.1 g/dL (ref 6.0–8.5)

## 2018-04-20 ENCOUNTER — Encounter: Payer: Self-pay | Admitting: Family Medicine

## 2018-04-27 ENCOUNTER — Telehealth: Payer: Self-pay | Admitting: Neurology

## 2018-04-27 NOTE — Telephone Encounter (Signed)
Pt called about getting scan for hip scheduled. Please call to advise

## 2018-04-27 NOTE — Telephone Encounter (Signed)
I spoke to the patient and informed her that with a xray she can walk into GI by 4 to get it done. But if she wanted to to give them a call at [phone] to see what the wait time may be.

## 2018-04-28 ENCOUNTER — Telehealth: Payer: Self-pay | Admitting: Neurology

## 2018-04-28 ENCOUNTER — Ambulatory Visit
Admission: RE | Admit: 2018-04-28 | Discharge: 2018-04-28 | Disposition: A | Discharge status: Home / Self Care | Payer: Medicare Other | Source: Ambulatory Visit | Attending: Neurology | Admitting: Neurology

## 2018-04-28 DIAGNOSIS — M1612 Unilateral primary osteoarthritis, left hip: Secondary | ICD-10-CM | POA: Diagnosis not present

## 2018-04-28 DIAGNOSIS — M25552 Pain in left hip: Secondary | ICD-10-CM

## 2018-04-28 NOTE — Telephone Encounter (Signed)
  The patient is having some left hip pain, worse with external rotation of the hip, x-ray is not normal, possibility of avascular necrosis was suggested, I will make referral to an orthopedic surgeon.  XR left hip 04/28/18:   IMPRESSION: Sclerosis of the left femoral head raise the possibility of AVN. No articular surface collapse, fracture or acute osseous abnormality identified.

## 2018-05-04 ENCOUNTER — Other Ambulatory Visit: Payer: Self-pay

## 2018-05-04 ENCOUNTER — Encounter: Payer: Self-pay | Admitting: Internal Medicine

## 2018-05-04 ENCOUNTER — Ambulatory Visit (INDEPENDENT_AMBULATORY_CARE_PROVIDER_SITE_OTHER): Payer: Medicare Other | Admitting: Internal Medicine

## 2018-05-04 VITALS — BP 116/70 | HR 66 | Resp 18 | Ht 70.0 in | Wt 161.0 lb

## 2018-05-04 DIAGNOSIS — E1065 Type 1 diabetes mellitus with hyperglycemia: Secondary | ICD-10-CM

## 2018-05-04 LAB — POCT GLYCOSYLATED HEMOGLOBIN (HGB A1C): Hemoglobin A1C: 10.3 % — AB (ref 4.0–5.6)

## 2018-05-04 NOTE — Patient Instructions (Addendum)
-   Continue Lantus to 12 units daily   - Novolog 8 units with Breakfast  - Novolog 10 units with Lunch and Supper         - Novolog correctional insulin: ADD extra units of insulin to your meal-time Novolog  dose if your blood sugars are higher than . Use the scale below to help guide you:   Blood sugar before meal Number of units to inject  Less than 200 0 unit  201 -  245 1 units  256 -  290 2 units  291-  335 3 units  336 -  380 4 units  381- 425 5 units  426- 470 6 units

## 2018-05-04 NOTE — Progress Notes (Signed)
Name: Joyce Brown  Age/ Sex: 68 y.o., female   MRN/ DOB: [id number], 11/23/1950     PCP: Lind Covert, MD   Reason for Endocrinology Evaluation: Type 1 Diabetes Mellitus  Initial Endocrine Consultative Visit: 01/17/2018    PATIENT IDENTIFIER: Joyce Brown is a 68 y.o. female with a past medical history of T1DM, cerebellar atrophy, asthma, Hx of gastric bypass, OSA on CPAP and Hx of gastric bypass in 2014  . The patient has followed with Endocrinology clinic since 01/17/18 for consultative assistance with management of her diabetes.  Patient moved from [place] in September, 2019 to be close to her sister. She has been diagnosed with stiff person Syndrome but Dr. Jannifer Franklin this may be more of cerebellar atrophy. She is on IVIG at this time.   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 She has only had one DKA episode which was in 11/2017. Marland Kitchen Her hemoglobin A1c has ranged from 8.4% in 01/2017, peaking at 12.1 % in 01/2016  SUBJECTIVE:   During the last visit (03/09/18):We decreased Lantus to 12 units and changed  Novolog to 6 units with breakfast and 8 units with lunch and supper.     Today (05/04/2018): Joyce Brown is here for a 8 week follow up.  She checks her blood sugars multiple times daily, using  CGM multiple times a day. The patient has less hypoglycemic episodes since the last clinic visit, which typically occur 1 x / week- most often occuring during the day . The patient is not symptomatic with these episodes. Otherwise, the patient has not required any recent emergency interventions for hypoglycemia and hasnot had recent hospitalizations secondary to hyper or hypoglycemic episodes.   We received a message from her stating insurance won't  cover Lantus , so we prescribed Basaglar but today she states the pharmacy continues to dispense lantus.    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  ROS: As per HPI and as detailed below: Review of Systems  Respiratory: Negative for cough and shortness of breath.   Cardiovascular: Negative for chest pain and palpitations.  Gastrointestinal: Negative for diarrhea and nausea.  Musculoskeletal: Positive for joint pain and myalgias.      HOME DIABETES REGIMEN:  Lantus 12 units QHS Novolog 6 units with breakfast and 8 units with lunch and supper    Blood sugar before meal Number of units to inject  Less than 150 0 unit  151 -  200 1 units  201 -  250 2 units  251 -  300 3 units  301 -  350 4 units  351 -  400 5 units  Over 400 Call the physician       METER DOWNLOAD SUMMARY: Date range evaluated: 1/21-2/20/2020 Fingerstick Blood Glucose Tests = 69 Average Number Tests/Day = 2.2 Overall Mean FS Glucose = 279 Standard Deviation = 148  BG Ranges: Low = 80 High = high    CONTINUOUS GLUCOSE MONITORING RECORD INTERPRETATION    Dates of Recording: 2/7-2/20/20  Sensor description: CGM G5  Results statistics:   CGM use % of time 93  Average and SD 232  Time in range   37   %  % Time Above 180 62  % Time Below target 1    Glycemic patterns summary: Daytime highs Glucose management indicator 8.8%  Hypoglycemic episodes occurred mostly during the day after a bolus.   Overnight periods:no overnight hypoglycemia      HISTORY:  Past Medical History:  Past Medical History:  Diagnosis Date  . Asthma   . Cerebellar ataxia ([employer])   . Diabetes mellitus without complication ([place])   . Glaucoma   . Hyperlipidemia   . Hypertension   . Pulmonary embolism ([place])   . Sleep apnea   . Spinal stenosis    Past Surgical History:  Past Surgical History:  Procedure Laterality Date  . ABDOMINAL HYSTERECTOMY    . ADENOIDECTOMY    . ANKLE  SURGERY Left   . BILATERAL CARPAL TUNNEL RELEASE    . ELBOW SURGERY    . GASTRIC BYPASS  2014  . HAND NEUROPLASTY    . TONSILLECTOMY      Social History:  reports that she has never smoked. She has never used smokeless tobacco. She reports that she does not drink alcohol or use drugs. Family History:  Family History  Problem Relation Age of Onset  . Cervical cancer Mother   . Dementia Father   . Retinal detachment Father   . Heart attack Father   . Diabetes Father   . Stroke Father   . Hypertension Father   . Glaucoma Paternal Grandfather      HOME MEDICATIONS: Allergies as of 05/04/2018      Reactions   Tylenol [acetaminophen] Other (See Comments)   Glucose monitor      Medication List       Accurate as of May 04, 2018  8:19 AM. Always use your most recent med list.        albuterol 108 (90 Base) MCG/ACT inhaler Commonly known as:  PROVENTIL HFA;VENTOLIN HFA Inhale 1 puff into the lungs 2 (two) times daily.   atorvastatin 40 MG tablet Commonly known as:  LIPITOR Take 1 tablet (40 mg total) by mouth daily at 6 PM.   BASAGLAR KWIKPEN 100 UNIT/ML Sopn Inject 0.12 mLs (12 Units total) into the skin daily.   Calcium Citrate 250 MG Tabs Take 2 tablets (500 mg total) by mouth 2 (two) times daily.   fluticasone 220 MCG/ACT inhaler Commonly known as:  FLOVENT HFA Inhale 1 puff into the lungs 2 (two) times daily.   ibuprofen 200 MG tablet Commonly known as:  ADVIL,MOTRIN Take 400 mg by mouth 2 (two) times daily.   insulin aspart 100 UNIT/ML FlexPen Commonly known as:  NOVOLOG Inject 5 Units into the skin 3 (three) times daily with meals. Max 30 units per day   Insulin Pen Needle 31G X 5 MM Misc Commonly known as:  B-D UF III MINI PEN NEEDLES Four times a day   MICROLET LANCETS Misc Twice a day   Misc. Devices Misc by Does not apply route. C PAP   multivitamin capsule Take 4 capsules by mouth daily. Bariatric fusion   mycophenolate 500 MG  tablet Commonly known as:  CELLCEPT Take 2 tablets (1,000 mg total) by mouth 2 (two) times daily.   oxybutynin 5 MG tablet Commonly known as:  DITROPAN Take 1 tablet (5 mg total) by mouth 2 (two) times daily.   PRIVIGEN IV Inject into the vein.   sertraline 100 MG tablet Commonly known as:  ZOLOFT Take 1 tablet (100 mg total) by mouth daily.   tiZANidine 2 MG tablet Commonly known as:  ZANAFLEX Take 2-4 mg by mouth at bedtime as needed for muscle spasms.   VALIUM 5 MG  tablet Generic drug:  diazepam Take 1 tablet (5 mg total) by mouth 3 (three) times daily.   Vitamin D3 75 MCG (3000 UT) Tabs Take by mouth.        OBJECTIVE:   Vital Signs: BP 116/70 (BP Location: Right Arm, Patient Position: Sitting, Cuff Size: Normal)   Pulse 66   Resp 18   Ht 5\' 10"  (1.778 m)   Wt 161 lb (73 kg)   SpO2 99%   BMI 23.10 kg/m   Wt Readings from Last 3 Encounters:  05/04/18 161 lb (73 kg)  04/18/18 161 lb 5 oz (73.2 kg)  03/20/18 161 lb 9.6 oz (73.3 kg)     Exam: General: Pt appears well and is in NAD  Lungs: Clear with good BS bilat with no rales, rhonchi, or wheezes  Heart: RRR with normal S1 and S2 and no gallops; no murmurs; no rub  Extremities: No pretibial edema.  Neuro: MS is good with appropriate affect, pt is alert and Ox3     DATA REVIEWED:  Lab Results  Component Value Date   HGBA1C 10.3 (A) 05/04/2018   HGBA1C 12.1 (A) 01/17/2018   Lab Results  Component Value Date   MICROALBUR 2.3 (H) 02/07/2018   CREATININE 0.92 04/18/2018       ASSESSMENT / PLAN / RECOMMENDATIONS:   1) Type 1 Diabetes Mellitus, poorly controlled, With hypoglycemia unawareness - Most recent A1c of10.3  %. Goal A1c < 7.5 %.  Without hypoglycemia   Plan:  - In review of her CGM download, patient noted with severe hyperglycemia, especially as the day progresses. Hypoglycemia episodes are less frequent. BG's are stable overnight.   - She was recently approved for the CGM G6, awaiting  for company to send documents.   - She denies snacking between meals or drinking any sugar-sweetened beverages.    MEDICATIONS:  Continue Lantus at 12 units daily   Increase Novolgo to 8 units with breakgfast and 10 units with lunch and supper   Correctional insulin to be added to meal time Novolog as below    Blood sugar before meal Number of units to inject  Less than 200 0 unit  201 -  245 1 units  256 -  290 2 units  291-  335 3 units  336 -  380 4 units  381- 425 5 units  426- 470 6 units       EDUCATION / INSTRUCTIONS:  BG monitoring instructions: Patient is instructed to check her blood sugars 2 times a day with CGM caliberation.  Call [facility] if: BG persistently < 70 or > 300. . I reviewed the Rule of 15 for the treatment of hypoglycemia in detail with the patient.    F/U in 3 months    Signed electronically by: Mack Guise, MD  [facility]  [address] [place], [place] [address] Phone: [phone] FAX: [phone]   CC: Lind Covert, [place] [address] Phone: [phone]  Fax: [phone]  Return to Endocrinology clinic as below: Future Appointments  Date Time Provider [facility]  05/04/2018  9:10 AM Vermelle Cammarata, Melanie Crazier, MD [facility] None  05/05/2018  8:30 AM Marzetta Board, DPM [facility]  06/08/2018  9:50 AM [facility] MM 2 [facility]  10/26/2018  2:30 PM Kathrynn Ducking, MD [facility] None

## 2018-05-05 ENCOUNTER — Ambulatory Visit: Payer: Medicare Other | Admitting: Podiatry

## 2018-05-08 ENCOUNTER — Ambulatory Visit (INDEPENDENT_AMBULATORY_CARE_PROVIDER_SITE_OTHER): Payer: Medicare Other | Admitting: Podiatry

## 2018-05-08 DIAGNOSIS — M79675 Pain in left toe(s): Secondary | ICD-10-CM

## 2018-05-08 DIAGNOSIS — E1065 Type 1 diabetes mellitus with hyperglycemia: Secondary | ICD-10-CM

## 2018-05-08 DIAGNOSIS — M79674 Pain in right toe(s): Secondary | ICD-10-CM | POA: Diagnosis not present

## 2018-05-08 DIAGNOSIS — B351 Tinea unguium: Secondary | ICD-10-CM | POA: Diagnosis not present

## 2018-05-08 NOTE — Progress Notes (Addendum)
Subjective: Joyce Brown presents today for preventative diabetic foot care with painful, thick toenails 1-5 b/l that she cannot cut and which interfere with daily activities.  Pain is aggravated when wearing enclosed shoe gear.  Lind Covert, MD is her PCP and last visit was 03/20/2018.  Pt states her A1c is now 10.3 which was down from 12. She states she will be getting a new DexCon continuous glucose monitor soon.   Current Outpatient Medications:  .  albuterol (PROVENTIL HFA;VENTOLIN HFA) 108 (90 Base) MCG/ACT inhaler, Inhale 1 puff into the lungs 2 (two) times daily., Disp: 1 Inhaler, Rfl: 2 .  atorvastatin (LIPITOR) 40 MG tablet, Take 1 tablet (40 mg total) by mouth daily at 6 PM., Disp: 90 tablet, Rfl: 3 .  Calcium Citrate 250 MG TABS, Take 2 tablets (500 mg total) by mouth 2 (two) times daily., Disp: 120 tablet, Rfl: 11 .  Cholecalciferol (VITAMIN D3) 75 MCG (3000 UT) TABS, Take by mouth., Disp: , Rfl:  .  fluticasone (FLOVENT HFA) 220 MCG/ACT inhaler, Inhale 1 puff into the lungs 2 (two) times daily., Disp: 3 Inhaler, Rfl: 4 .  ibuprofen (ADVIL,MOTRIN) 200 MG tablet, Take 400 mg by mouth 2 (two) times daily., Disp: , Rfl:  .  Immune Globulin, Human, (PRIVIGEN IV), Inject into the vein., Disp: , Rfl:  .  insulin aspart (NOVOLOG) 100 UNIT/ML FlexPen, Inject 5 Units into the skin 3 (three) times daily with meals. Max 30 units per day, Disp: 15 mL, Rfl: 11 .  Insulin Glargine (BASAGLAR KWIKPEN) 100 UNIT/ML SOPN, Inject 0.12 mLs (12 Units total) into the skin daily., Disp: 15 mL, Rfl: 11 .  Insulin Pen Needle (B-D UF III MINI PEN NEEDLES) 31G X 5 MM MISC, Four times a day, Disp: 150 each, Rfl: 11 .  MICROLET LANCETS MISC, Twice a day, Disp: 100 each, Rfl: 11 .  Misc. Devices MISC, by Does not apply route. C PAP, Disp: , Rfl:  .  Multiple Vitamin (MULTIVITAMIN) capsule, Take 4 capsules by mouth daily. Bariatric fusion , Disp: , Rfl:  .  mycophenolate (CELLCEPT) 500 MG tablet, Take  2 tablets (1,000 mg total) by mouth 2 (two) times daily., Disp: 360 tablet, Rfl: 1 .  oxybutynin (DITROPAN) 5 MG tablet, Take 1 tablet (5 mg total) by mouth 2 (two) times daily., Disp: 180 tablet, Rfl: 1 .  sertraline (ZOLOFT) 100 MG tablet, Take 1 tablet (100 mg total) by mouth daily., Disp: 90 tablet, Rfl: 1 .  tiZANidine (ZANAFLEX) 2 MG tablet, Take 2-4 mg by mouth at bedtime as needed for muscle spasms., Disp: , Rfl:  .  VALIUM 5 MG tablet, Take 1 tablet (5 mg total) by mouth 3 (three) times daily. (Patient taking differently: Take 5 mg by mouth. 1/2 tab in the am, 1/2 tab in pm), Disp: 90 tablet, Rfl: 2  Allergies  Allergen Reactions  . Tylenol [Acetaminophen] Other (See Comments)    Glucose monitor    Objective:  Vascular Examination: Capillary refill time immediate x 10 digits  Dorsalis pedis and Posterior tibial pulses palpable b/l  Digital hair absent x 10 digits  Skin temperature gradient WNL b/l  Dermatological Examination: Skin with normal turgor, texture and tone b/l  Toenails 1-5 b/l discolored, thick, dystrophic with subungual debris and pain with palpation to nailbeds due to thickness of nails.  No hyperkeratosis of heels b/l on today.  Musculoskeletal: Muscle strength 5/5 to all LE muscle groups  HAV with bunion b/l  Hammertoes 2-5  b/l  Tailor's bunion b/l  No pain, crepitus or joint limitation noted with ROM.   Neurological: Sensation intact with 10 gram monofilament.  Vibratory sensation intact.  Assessment: 1. Painful onychomycosis toenails 1-5 b/l  2. IDDM, uncontrolled   Plan: 1. Toenails 1-5 b/l were debrided in length and girth without iatrogenic bleeding. 2. Patient to continue soft, supportive shoe gear 3. Patient to report any pedal injuries to medical professional immediately. 4. Follow up 3 months.  5. Patient/POA to call should there be a concern in the interim.

## 2018-05-08 NOTE — Patient Instructions (Signed)

## 2018-05-09 ENCOUNTER — Encounter: Payer: Self-pay | Admitting: Podiatry

## 2018-05-16 ENCOUNTER — Telehealth: Payer: Self-pay | Admitting: Internal Medicine

## 2018-05-16 NOTE — Telephone Encounter (Signed)
No I have not seen any paperwork for this pt, so I call [employer] and asked them to re-fax CMN and put attention to me

## 2018-05-16 NOTE — Telephone Encounter (Signed)
[employer] is calling for an update on paperwork that was faxed to our office for a CMN for CGM SUPPLIES   PHONE- [phone]

## 2018-05-16 NOTE — Telephone Encounter (Signed)
Have you received this?

## 2018-05-18 ENCOUNTER — Ambulatory Visit (INDEPENDENT_AMBULATORY_CARE_PROVIDER_SITE_OTHER): Payer: Medicare Other

## 2018-05-18 ENCOUNTER — Encounter (INDEPENDENT_AMBULATORY_CARE_PROVIDER_SITE_OTHER): Payer: Self-pay | Admitting: Orthopaedic Surgery

## 2018-05-18 ENCOUNTER — Ambulatory Visit (INDEPENDENT_AMBULATORY_CARE_PROVIDER_SITE_OTHER): Payer: Medicare Other | Admitting: Orthopaedic Surgery

## 2018-05-18 VITALS — BP 135/71 | HR 73 | Ht 70.0 in | Wt 161.0 lb

## 2018-05-18 DIAGNOSIS — M5441 Lumbago with sciatica, right side: Secondary | ICD-10-CM

## 2018-05-18 DIAGNOSIS — M5442 Lumbago with sciatica, left side: Secondary | ICD-10-CM | POA: Diagnosis not present

## 2018-05-18 DIAGNOSIS — G8929 Other chronic pain: Secondary | ICD-10-CM | POA: Insufficient documentation

## 2018-05-18 DIAGNOSIS — M25552 Pain in left hip: Secondary | ICD-10-CM | POA: Insufficient documentation

## 2018-05-18 NOTE — Progress Notes (Signed)
Office Visit Note   Patient: Joyce Brown           Date of Birth: 16-Aug-1950           MRN: [id number] Visit Date: 05/18/2018              Requested by: Joyce Ducking, MD [address] [place], [place] [address] PCP: Joyce Covert, MD   Assessment & Plan: Visit Diagnoses:  1. Chronic midline low back pain with bilateral sciatica   2. Pain of left hip joint     Plan: MRI scan of the left hip.  I am not sure if there is any pathology about her hip based on her plain films but I think an MRI scan would certainly help determine if there is any abnormality not visible i.e. early avascular necrosis, arthritis.  I suspect that some of her problem might be related to her lumbar spine with a prior history of spinal stenosis.  I would consider MRI scan of her lumbar spine if the pelvis MRI scan is nondiagnostic office visit over 45 minutes 50% of the time in counseling  Follow-Up Instructions: No follow-ups on file.   Orders:  Orders Placed This Encounter  Procedures  . XR Lumbar Spine 2-3 Views   No orders of the defined types were placed in this encounter.     Procedures: No procedures performed   Clinical Data: No additional findings.   Subjective: Chief Complaint  Patient presents with  . Left Hip - Pain  Patient presents today with left hip pain X years. Patient states that it hurts on the posterior side and in her groin. She uses a walker in her apartment and a wheelchair elsewhere. No numbness or tingling down her lower extremities. She takes Ibuprofen as needed. She saw her neurologist, Joyce Brown and had x-rays of her left hip in February.  Joyce Brown has a history of cerebellar ataxia and has an issue with her balance.  She spends a fair amount of time in a wheelchair and when she is up and about she will use a walker.  She also relates having been diagnosed with spinal stenosis approximately 20 years ago when she was living in [place].   She had radiofrequency ablation but has not had any treatment in "years".  On this occasion she had relatively insidious onset of pain without injury or trauma.  She notes that when she stands for any length of time she will have some pain in her back and her left leg in the area of her left thigh.  When she walks she will have some pain in her left groin.  Joyce Brown obtain films of her pelvis which I reviewed on the PACS system.  There are some mild degenerative changes in the left hip but the joint space is well-maintained.  There are subchondral cysts in the femoral head.  I do not see any obvious avascular necrosis.  No other bony lesions identified no obvious femoral head collapse. Joyce Brown relates that she is an insulin-dependent diabetic but does not have neuropathy. A recent bone density study was negative for osteoporosis.  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  HENT: Negative for ear pain.   Eyes: Negative for pain.  Respiratory:  Negative for shortness of breath.   Cardiovascular: Negative for leg swelling.  Gastrointestinal: Negative for constipation and diarrhea.  Endocrine: Negative for cold intolerance and heat intolerance.  Genitourinary: Negative for difficulty urinating.  Musculoskeletal: Negative for joint swelling.  Skin: Negative for rash.  Allergic/Immunologic: Negative for food allergies.  Neurological: Positive for weakness.  Hematological: Does not bruise/bleed easily.  Psychiatric/Behavioral: Positive for sleep disturbance.     Objective: Vital Signs: BP 135/71   Pulse 73   Ht 5\' 10"  (1.778 m)   Wt 161 lb (73 kg)   BMI 23.10 kg/m   Physical Exam Constitutional:      Appearance: She is well-developed.  Eyes:     Pupils: Pupils are equal,  round, and reactive to light.  Pulmonary:     Effort: Pulmonary effort is normal.  Skin:    General: Skin is warm and dry.  Neurological:     Mental Status: She is alert and oriented to person, place, and time.  Psychiatric:        Behavior: Behavior normal.     Ortho Exam awake alert and oriented x3.  Evaluated in a wheelchair.  Does have a mildly garbled speech which she notes is chronic.  She was somewhat rigid with  motion of her lower extremities but I did not think she had any significant loss of motion of her right compared to the left hip.  Straight leg raise was negative.  Skin intact.  No lower extremity edema.  No percussible tenderness of the lumbar spine.  Some pain in the left hip with abduction.  Specialty Comments:  No specialty comments available.  Imaging: No results found.   PMFS History: Patient Active Problem List   Diagnosis Date Noted  . Pain of left hip joint 05/18/2018  . Chronic midline low back pain with bilateral sciatica 05/18/2018  . Depression 03/20/2018  . Breast calcifications 12/28/2017  . Dry mouth 12/06/2017  . Fibromyalgia 12/06/2017  . History of gastric bypass 12/06/2017  . Spinal stenosis 12/06/2017  . Hypokalemia 12/06/2017  . Asthma 12/06/2017  . Cerebellar ataxia ([place])   . Type I diabetes mellitus with complication, uncontrolled ([place])    Past Medical History:  Diagnosis Date  . Asthma   . Cerebellar ataxia ([place])   . Diabetes mellitus without complication ([place])   . Glaucoma   . Hyperlipidemia   . Hypertension   . Pulmonary embolism ([place])   . Sleep apnea   . Spinal stenosis     Family History  Problem Relation Age of Onset  . Cervical cancer Mother   . Dementia Father   . Retinal detachment Father   . Heart attack Father   . Diabetes Father   . Stroke Father   . Hypertension Father   . Glaucoma Paternal Grandfather     Past Surgical History:  Procedure Laterality Date  . ABDOMINAL HYSTERECTOMY    . ADENOIDECTOMY     . ANKLE SURGERY Left   . BILATERAL CARPAL TUNNEL RELEASE    . ELBOW SURGERY    . GASTRIC BYPASS  2014  . HAND NEUROPLASTY    . TONSILLECTOMY     Social History   Occupational History  . Not on file  Tobacco Use  . Smoking status: Never Smoker  . Smokeless tobacco: Never Used  Substance and Sexual Activity  . Alcohol use: Never    Frequency: Never  . Drug use: Never  . Sexual activity: Not on file

## 2018-05-18 NOTE — Addendum Note (Signed)
Addended by: Lendon Collar on: 05/18/2018 11:32 AM   Modules accepted: Orders

## 2018-05-19 ENCOUNTER — Ambulatory Visit: Payer: Medicare Other | Admitting: Orthotics

## 2018-05-19 DIAGNOSIS — M2012 Hallux valgus (acquired), left foot: Secondary | ICD-10-CM | POA: Diagnosis not present

## 2018-05-19 DIAGNOSIS — E109 Type 1 diabetes mellitus without complications: Secondary | ICD-10-CM

## 2018-05-19 DIAGNOSIS — M2011 Hallux valgus (acquired), right foot: Secondary | ICD-10-CM | POA: Diagnosis not present

## 2018-05-19 DIAGNOSIS — M2041 Other hammer toe(s) (acquired), right foot: Secondary | ICD-10-CM

## 2018-05-19 DIAGNOSIS — M2042 Other hammer toe(s) (acquired), left foot: Secondary | ICD-10-CM | POA: Diagnosis not present

## 2018-05-23 ENCOUNTER — Telehealth: Payer: Self-pay | Admitting: Internal Medicine

## 2018-05-23 MED ORDER — INSULIN ASPART 100 UNIT/ML FLEXPEN: 12.0000 [IU] | PEN_INJECTOR | Freq: Three times a day (TID) | SUBCUTANEOUS | 11 refills | Status: DC

## 2018-05-23 NOTE — Telephone Encounter (Signed)
Pt stated that 12 units 3 times a day with meals will be 36 units total daily and rx has max of 30

## 2018-05-23 NOTE — Telephone Encounter (Signed)
CGM data reviewed as below     CONTINUOUS GLUCOSE MONITORING RECORD INTERPRETATION    Dates of Recording: 2/20-05/18/2018  Sensor description:DexCom G5  Results statistics:   CGM use % of time 93  Average and SD 208/99  Time in range   55     %  % Time Above 180 41  % Time above 250   % Time Below target 4    Glycemic patterns summary: Hyperglycemia with meals , stable overnight   Hyperglycemic episodes  Post-prandial   Hypoglycemic episodes occurred  Overnight periods: stable  Recommendations   Increase Novolog to 12 units with each meal (assuming she has been taking the 8 units )  Continue Lantus at 12 units daily     Abby Nena Jordan, MD  [facility] Endocrin[facility]  [facility] [address] [place], [place] [address] Phone: [phone] FAX: [phone]

## 2018-05-24 ENCOUNTER — Telehealth: Payer: Self-pay | Admitting: Neurology

## 2018-05-24 NOTE — Telephone Encounter (Signed)
Patient request a transfer to infusion

## 2018-05-26 ENCOUNTER — Encounter: Payer: Self-pay | Admitting: Family Medicine

## 2018-05-26 DIAGNOSIS — G2582 Stiff-man syndrome: Secondary | ICD-10-CM | POA: Diagnosis not present

## 2018-05-31 ENCOUNTER — Ambulatory Visit
Admission: RE | Admit: 2018-05-31 | Discharge: 2018-05-31 | Disposition: A | Discharge status: Home / Self Care | Payer: Medicare Other | Source: Ambulatory Visit | Attending: Orthopaedic Surgery | Admitting: Orthopaedic Surgery

## 2018-05-31 ENCOUNTER — Other Ambulatory Visit: Payer: Self-pay

## 2018-05-31 DIAGNOSIS — S73192A Other sprain of left hip, initial encounter: Secondary | ICD-10-CM | POA: Diagnosis not present

## 2018-05-31 DIAGNOSIS — M25552 Pain in left hip: Secondary | ICD-10-CM

## 2018-06-01 ENCOUNTER — Telehealth (INDEPENDENT_AMBULATORY_CARE_PROVIDER_SITE_OTHER): Payer: Self-pay | Admitting: Orthopaedic Surgery

## 2018-06-01 NOTE — Telephone Encounter (Signed)
Spoke with Joyce Brown in regards to her MRI scan of her hip.  This showed a labral tear.  We discussed assertive treatment as well as possible operative treatment in the future.  She will like to stay conservative.  She will limit her activities do gentle stretching as well as some swimming.  If her symptoms worsen then we can schedule a corticosteroid injection to the hip.

## 2018-06-01 NOTE — Telephone Encounter (Signed)
Please advise 

## 2018-06-01 NOTE — Telephone Encounter (Signed)
Patient request a call if possible to go over MRI results. Patient has an appt tomorrow, but is concerned with coming into office. Please call by 4:30pm if possible, patient needs to cancel SCAT if not coming in.

## 2018-06-01 NOTE — Telephone Encounter (Signed)
Please call.

## 2018-06-02 ENCOUNTER — Encounter (INDEPENDENT_AMBULATORY_CARE_PROVIDER_SITE_OTHER): Payer: Self-pay

## 2018-06-02 ENCOUNTER — Telehealth: Payer: Self-pay | Admitting: Internal Medicine

## 2018-06-02 ENCOUNTER — Other Ambulatory Visit: Payer: Self-pay

## 2018-06-02 ENCOUNTER — Ambulatory Visit (INDEPENDENT_AMBULATORY_CARE_PROVIDER_SITE_OTHER): Payer: Medicare Other | Admitting: Orthopaedic Surgery

## 2018-06-02 DIAGNOSIS — E1065 Type 1 diabetes mellitus with hyperglycemia: Secondary | ICD-10-CM

## 2018-06-02 MED ORDER — INSULIN GLARGINE 100 UNIT/ML ~~LOC~~ SOLN: 12.0000 [IU] | Freq: Every day | SUBCUTANEOUS | 11 refills | Status: DC

## 2018-06-02 NOTE — Telephone Encounter (Signed)
Please let the patient know that I received her letter.    I would not make any changes because they continue to fluctuate between 55 and 502 mg/dL. If I got up on her insulin, she will drop some more and if I decrease her insulin, her 500 glucose will be even higher.   Please confirm her insulin doses too as per med list.   I would like for her to see our CDE to figure our consistent carbohydrate intake.     Thank you   Abby Nena Jordan, MD  [facility] Endocrinology  [facility] [address] [place],  [address] Phone: [phone] FAX: [phone]

## 2018-06-02 NOTE — Telephone Encounter (Signed)
Pt stated that she is currently taking 12 units 3 times a day before meals of novolog and 12 units of lantus. However, in pt chart is basaglar, please advise

## 2018-06-02 NOTE — Telephone Encounter (Signed)
So I took basaglar out of pt chart and added lantus also lft vm with instructions to continue lantus and novolog for pt

## 2018-06-07 ENCOUNTER — Encounter: Payer: Self-pay | Admitting: Family Medicine

## 2018-06-07 ENCOUNTER — Encounter: Payer: Self-pay | Admitting: Internal Medicine

## 2018-06-08 ENCOUNTER — Ambulatory Visit: Payer: Medicare Other

## 2018-06-08 ENCOUNTER — Other Ambulatory Visit: Payer: Self-pay

## 2018-06-08 MED ORDER — INSULIN GLARGINE 100 UNITS/ML SOLOSTAR PEN: 12.0000 [IU] | PEN_INJECTOR | Freq: Every day | SUBCUTANEOUS | 11 refills | Status: DC

## 2018-06-08 MED ORDER — INSULIN GLARGINE 100 UNITS/ML SOLOSTAR PEN: PEN_INJECTOR | SUBCUTANEOUS | 11 refills | Status: DC

## 2018-06-16 ENCOUNTER — Encounter: Payer: Self-pay | Admitting: Internal Medicine

## 2018-06-22 ENCOUNTER — Telehealth: Payer: Self-pay | Admitting: Internal Medicine

## 2018-06-22 NOTE — Telephone Encounter (Signed)
Pt informed of no changes in regimen and pt also has a smart phone. Pt e-mail in chart is accurate so pt is willing to have future visits on Doxy.

## 2018-06-22 NOTE — Telephone Encounter (Signed)
   GLUCOSE LOG   Date Breakfast  Lunch  Supper  Bedtime  06/02/2018 156 247 217 214  3/19 150 121 401 311  3/18 146 173 141 528/481  3/17 [phone] 468      Joyce Brown,   Please let the patient know I will not be making any changes to her regimen because the last few days of the chart her sugars have been in 112 -150 . Her 400 sugars have to do with probably eating more carbohydrate then her insulin could cover.    Please ask her not to mail any readings anymore. Because they come 3 weeks late and I can't make changes on 29 week old sugar readings,   She can call us when she has concerns and will down load her dexcom remotely.    Also, please advise her when she gets a sugar that is > 400. She needs to start writing down what and when the last time she ate prior to that reading, and what time she took the insulin .     Thank you     Abby Nena Jordan, MD  [facility] Endocrinology  [facility] [address] [place], [place] [address] Phone: [phone] FAX: [phone]

## 2018-07-04 ENCOUNTER — Telehealth: Payer: Self-pay | Admitting: Neurology

## 2018-07-04 NOTE — Telephone Encounter (Signed)
Pt has called for the infusion suite, call connected.

## 2018-07-06 DIAGNOSIS — G2582 Stiff-man syndrome: Secondary | ICD-10-CM | POA: Diagnosis not present

## 2018-07-07 ENCOUNTER — Ambulatory Visit: Payer: Medicare Other

## 2018-07-11 ENCOUNTER — Encounter: Payer: Self-pay | Admitting: Family Medicine

## 2018-07-16 ENCOUNTER — Telehealth: Payer: Self-pay | Admitting: Neurology

## 2018-07-16 DIAGNOSIS — Z5181 Encounter for therapeutic drug level monitoring: Secondary | ICD-10-CM

## 2018-07-16 NOTE — Telephone Encounter (Signed)
I called the patient.  The patient is to come in for blood work while on CellCept.  She has to come in for a Port-A-Cath flush procedure anyway, she will have the blood work done then.

## 2018-07-17 ENCOUNTER — Ambulatory Visit (INDEPENDENT_AMBULATORY_CARE_PROVIDER_SITE_OTHER): Payer: Medicare Other | Admitting: Internal Medicine

## 2018-07-17 ENCOUNTER — Other Ambulatory Visit: Payer: Self-pay

## 2018-07-17 ENCOUNTER — Encounter: Payer: Self-pay | Admitting: Internal Medicine

## 2018-07-17 DIAGNOSIS — E1065 Type 1 diabetes mellitus with hyperglycemia: Secondary | ICD-10-CM

## 2018-07-17 MED ORDER — INJECTION DEVICE FOR INSULIN DEVI: 0 refills | Status: DC

## 2018-07-17 MED ORDER — INSULIN GLARGINE 100 UNITS/ML SOLOSTAR PEN: 10.0000 [IU] | PEN_INJECTOR | Freq: Every day | SUBCUTANEOUS | 11 refills | Status: DC

## 2018-07-17 MED ORDER — INSULIN ASPART 100 UNIT/ML CARTRIDGE (PENFILL): SUBCUTANEOUS | 1 refills | Status: DC

## 2018-07-17 NOTE — Progress Notes (Signed)
Virtual Visit via Video Note  I connected with Joyce Brown on 07/17/18 at 9:30 AM  by a video enabled telemedicine application and verified that I am speaking with the correct person using two identifiers.   I discussed the limitations of evaluation and management by telemedicine and the availability of in person appointments. The patient expressed understanding and agreed to proceed.  -Location of the patient : Home -Location of the provider : office -The names of all persons participating in the telemedicine service : Pt and myself        Name: Joyce Brown  Age/ Sex: 68 y.o., female   MRN/ DOB: [id number], 03/14/1951     PCP: Lind Covert, MD   Reason for Endocrinology Evaluation: Type 1 Diabetes Mellitus  Initial Endocrine Consultative Visit: 01/17/2018    PATIENT IDENTIFIER: Joyce Brown is a 68 y.o. female with a past medical history of T1DM, cerebellar atrophy, asthma, OSA on CPAP and Hx of gastric bypass in 2014  . The patient has followed with Endocrinology clinic since 01/17/18 for consultative assistance with management of her diabetes.  Patient moved from [place] in September, 2019 to be close to her sister. She has been diagnosed with stiff person Syndrome but Dr. Jannifer Franklin this may be more of cerebellar atrophy. She is on IVIG at this time.   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 She has only had one DKA episode which was in 11/2017. Marland Kitchen Her hemoglobin A1c has ranged from 8.4% in 01/2017, peaking at 12.1 % in 01/2016  She is followed by Dr. 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 She has been off IVIG since 02/2018. He has been tapering her diazepam down and is currently on cellcept.   SUBJECTIVE:   During the last visit (05/04/2018): Continued lantus at 12 units, increased prandial.     Today (07/17/2018): Joyce Brown is here for a 3 months virtual follow up visit on diabetes.  She checks her blood sugars multiple times daily, using  CGM ([employer]). The patient has  hypoglycemic episodes since the last clinic visit, which typically occur 1 x / week- most often occuring during the day . The patient is symptomatic with these episodes. Otherwise, the patient has not required any recent emergency interventions for hypoglycemia and hasnot had recent hospitalizations secondary to hyper or hypoglycemic episodes.   She continues to taper down on diazepam and feels her speech has gotten better and feels she is getting stronger as well.     ROS: As per HPI and as detailed below: Review of Systems  Respiratory: Negative for cough and shortness of breath.   Cardiovascular: Negative for chest pain and palpitations.  Gastrointestinal: Negative for diarrhea and nausea.  Genitourinary: Negative for frequency.  Musculoskeletal: Negative for joint pain and myalgias.  Neurological: Positive for tingling and speech change. Negative for weakness.       Improving  Heel tingling when lying in bed   Endo/Heme/Allergies: Negative for polydipsia.      HOME DIABETES REGIMEN:  Lantus 12 units QHS Novolog 12 units TIDQAC    Blood sugar before meal Number of units to inject  Less than 150 0 unit  151 -  200 1 units  201 -  250 2 units  251 -  300 3 units  301 -  350 4 units  351 -  400  5 units  Over 400 Call the physician          METER DOWNLOAD SUMMARY: 3/25-06/20/2018 0000-0500  average 68 mg/dL 0500- 1100    Average 178 1100-1600 average 269 1600- 2200   Average 292 2200- 0000  Average 96       CONTINUOUS GLUCOSE MONITORING RECORD INTERPRETATION    Dates of Recording: 4/21-07/17/2018 Sensor description:  Results statistics:   CGM use % of time 100  Average and SD 255/90  Time in range    22    %  % Time Above 180 77  % Time above 250 52.8  % Time Below target 1     Glycemic patterns summary: Hyperglycemia during the day   Hyperglycemic episodes  Post-prandial   Hypoglycemic episodes occurred postprandial and early morning   Overnight periods: decrease in BG's    HISTORY:  Past Medical History:  Past Medical History:  Diagnosis Date  . Asthma   . Cerebellar ataxia ([place])   . Diabetes mellitus without complication ([place])   . Glaucoma   . Hyperlipidemia   . Hypertension   . Pulmonary embolism ([place])   . Sleep apnea   . Spinal stenosis    Past Surgical History:  Past Surgical History:  Procedure Laterality Date  . ABDOMINAL HYSTERECTOMY    . ADENOIDECTOMY    . ANKLE SURGERY Left   . BILATERAL CARPAL TUNNEL RELEASE    . ELBOW SURGERY    . GASTRIC BYPASS  2014  . HAND NEUROPLASTY    . TONSILLECTOMY      Social History:  reports that she has never smoked. She has never used smokeless tobacco. She reports that she does not drink alcohol or use drugs. Family History:  Family History  Problem Relation Age of Onset  . Cervical cancer Mother   . Dementia Father   . Retinal detachment Father   . Heart attack Father   . Diabetes Father   . Stroke Father   . Hypertension Father   . Glaucoma Paternal Grandfather      HOME MEDICATIONS: Allergies as of 07/17/2018      Reactions   Tylenol [acetaminophen] Other (See Comments)   Glucose monitor      Medication List       Accurate as of Jul 17, 2018  7:51 AM. Always use your most recent med list.        albuterol 108 (90 Base) MCG/ACT inhaler Commonly known as:  VENTOLIN HFA Inhale 1 puff into the lungs 2 (two) times daily.   atorvastatin 40 MG tablet Commonly known as:  LIPITOR Take 1 tablet (40 mg total) by mouth daily at 6 PM.   Calcium Citrate 250 MG Tabs Take 2 tablets (500 mg total) by mouth 2 (two) times daily.   fluticasone 220 MCG/ACT inhaler Commonly known as:  FLOVENT HFA Inhale 1 puff into the lungs 2 (two) times daily.   fluticasone 50 MCG/BLIST diskus  inhaler Commonly known as:  FLOVENT DISKUS Inhale 1 puff into the lungs 2 (two) times daily.   ibuprofen 200 MG tablet Commonly known as:  ADVIL Take 400 mg by mouth 2 (two) times daily.   insulin glargine 100 unit/mL Sopn Commonly known as:  LANTUS Inject 12 units daily DX E10.65   Insulin Pen Needle 31G X 5 MM Misc Commonly known as:  B-D UF III MINI PEN NEEDLES Four times a day   Microlet Lancets Misc Twice a day   Misc. Devices Misc  by Does not apply route. C PAP   multivitamin capsule Take 4 capsules by mouth daily. Bariatric fusion   mycophenolate 500 MG tablet Commonly known as:  CELLCEPT Take 2 tablets (1,000 mg total) by mouth 2 (two) times daily.   oxybutynin 5 MG tablet Commonly known as:  DITROPAN Take 1 tablet (5 mg total) by mouth 2 (two) times daily.   PRIVIGEN IV Inject into the vein.   sertraline 100 MG tablet Commonly known as:  ZOLOFT Take 1 tablet (100 mg total) by mouth daily.   tiZANidine 2 MG tablet Commonly known as:  ZANAFLEX Take 2-4 mg by mouth at bedtime as needed for muscle spasms.   Valium 5 MG tablet Generic drug:  diazepam Take 1 tablet (5 mg total) by mouth 3 (three) times daily.   Vitamin D3 75 MCG (3000 UT) Tabs Take by mouth.         DATA REVIEWED:  Lab Results  Component Value Date   HGBA1C 10.3 (A) 05/04/2018   HGBA1C 12.1 (A) 01/17/2018   Lab Results  Component Value Date   MICROALBUR 2.3 (H) 02/07/2018   CREATININE 0.92 04/18/2018       ASSESSMENT / PLAN / RECOMMENDATIONS:   1) Type 1 Diabetes Mellitus, poorly controlled, With Hx of hypoglycemia unawareness - Most recent A1c of 10.3  %. Goal A1c < 7.5 %.   Plan:  - In review of her meter download, the patient has had early morning hypoglycemia, she continues with hyperglycemia at lunch and supper time . Her BG's are variable due to insulin -CHO mismatch  - She denies snacking between meals or drinking any sugar-sweetened beverages.  - She continues  to assure me that she does not forget to take her prandial insulin  - Her post-prandial hypoglycemia is due to insulin-CHO mismatch, she is going to see our CDE and discuss CHO consistency with meals.  - We will reduce her lantus down due to overnight hypoglycemia - We also  discussed the benefits of switching her to [employer], which she agreed to . - I am going to check insulin Antibody levels, as it seems her prandial insulin takes longer to work, and sometimes she is followed by a post-prandial hypoglycemia - We discussed the correct  injection locations as she has been using the anterior thigh and inner arms.    MEDICATIONS:  Decrease Lantus to 10 units daily   Increase Novolgo to 14 units with breakfast and lunch and 12 units with supper   Correctional insulin to be added to meal time Novolog as below    Blood sugar before meal Number of units to inject  Less than 200 0 unit  201 -  245 1 units  246 -  290 2 units  291-  335 3 units  336 -  380 4 units  381- 425 5 units  426- 470 6 units       EDUCATION / INSTRUCTIONS:  BG monitoring instructions: Patient is instructed to check her blood sugars 2 times a day with CGM caliberation.  Call [facility] if: BG persistently < 70 or > 300. . I reviewed the Rule of 15 for the treatment of hypoglycemia in detail with the patient.     2) Diabetic complications:   Eye: Does not have known diabetic retinopathy. Last eye exam was   Neuro/ Feet: Does not have known diabetic peripheral neuropathy.  Renal: Patient does not have known baseline CKD. She is not  on an ACEI/ARB at present.   3) Lipids: Patient is on a statin. Will obtain lipid profile on next lab draw.   4) Hx of Autoimmune DM:  - No baseline TFT's. Will obtain TSH.    I discussed the assessment and treatment plan with the patient. The patient was provided an opportunity to ask questions and all were answered. The patient agreed with the  plan and demonstrated an understanding of the instructions.   The patient was advised to call back or seek an in-person evaluation if the symptoms worsen or if the condition fails to improve as anticipated.     F/U in 3 months    Signed electronically by: Mack Guise, MD  [facility] End[facility]  [address] [place], [place] [address] Phone: [phone] FAX: [phone]   CC: Lind Covert, [address] [place] [address] Phone: [phone]  Fax: [phone]  Return to Endocrinology clinic as below: Future Appointments  Date Time Provider [place]  07/17/2018  9:30 AM Shamleffer, Melanie Crazier, MD [facility] None  07/31/2018  2:00 PM Clydell Hakim, RD [facility]  08/08/2018 11:00 AM Marzetta Board, DPM [facility]  08/22/2018 11:00 AM Star Age, MD [facility] None  08/30/2018  9:00 AM [facility] MM 2 [facility]  10/26/2018  2:30 PM Kathrynn Ducking, MD [facility] None

## 2018-07-19 ENCOUNTER — Institutional Professional Consult (permissible substitution): Payer: Medicare Other | Admitting: Neurology

## 2018-07-20 ENCOUNTER — Other Ambulatory Visit: Payer: Self-pay | Admitting: Family Medicine

## 2018-07-21 ENCOUNTER — Ambulatory Visit: Payer: Medicare Other

## 2018-07-31 ENCOUNTER — Ambulatory Visit: Payer: Medicare Other | Admitting: Dietician

## 2018-08-01 ENCOUNTER — Other Ambulatory Visit (INDEPENDENT_AMBULATORY_CARE_PROVIDER_SITE_OTHER): Payer: Self-pay

## 2018-08-01 ENCOUNTER — Other Ambulatory Visit: Payer: Self-pay

## 2018-08-01 DIAGNOSIS — G2582 Stiff-man syndrome: Secondary | ICD-10-CM | POA: Diagnosis not present

## 2018-08-01 DIAGNOSIS — Z5181 Encounter for therapeutic drug level monitoring: Secondary | ICD-10-CM

## 2018-08-01 DIAGNOSIS — Z0289 Encounter for other administrative examinations: Secondary | ICD-10-CM

## 2018-08-02 ENCOUNTER — Ambulatory Visit: Payer: Medicare Other | Admitting: Internal Medicine

## 2018-08-02 LAB — CBC WITH DIFFERENTIAL/PLATELET
Basophils Absolute: 0.1 10*3/uL (ref 0.0–0.2)
Basos: 1 %
EOS (ABSOLUTE): 0.1 10*3/uL (ref 0.0–0.4)
Eos: 3 %
Hematocrit: 36.1 % (ref 34.0–46.6)
Hemoglobin: 11.6 g/dL (ref 11.1–15.9)
Immature Grans (Abs): 0 10*3/uL (ref 0.0–0.1)
Immature Granulocytes: 0 %
Lymphocytes Absolute: 1.2 10*3/uL (ref 0.7–3.1)
Lymphs: 27 %
MCH: 28.6 pg (ref 26.6–33.0)
MCHC: 32.1 g/dL (ref 31.5–35.7)
MCV: 89 fL (ref 79–97)
Monocytes Absolute: 0.4 10*3/uL (ref 0.1–0.9)
Monocytes: 10 %
Neutrophils Absolute: 2.5 10*3/uL (ref 1.4–7.0)
Neutrophils: 59 %
Platelets: 293 10*3/uL (ref 150–450)
RBC: 4.06 x10E6/uL (ref 3.77–5.28)
RDW: 13.5 % (ref 11.7–15.4)
WBC: 4.2 10*3/uL (ref 3.4–10.8)

## 2018-08-02 LAB — COMPREHENSIVE METABOLIC PANEL
ALT: 21 IU/L (ref 0–32)
AST: 22 IU/L (ref 0–40)
Albumin/Globulin Ratio: 2.3 — ABNORMAL HIGH (ref 1.2–2.2)
Albumin: 4.4 g/dL (ref 3.8–4.8)
Alkaline Phosphatase: 53 IU/L (ref 39–117)
BUN/Creatinine Ratio: 22 (ref 12–28)
BUN: 20 mg/dL (ref 8–27)
Bilirubin Total: 0.3 mg/dL (ref 0.0–1.2)
CO2: 23 mmol/L (ref 20–29)
Calcium: 10.1 mg/dL (ref 8.7–10.3)
Chloride: 106 mmol/L (ref 96–106)
Creatinine, Ser: 0.9 mg/dL (ref 0.57–1.00)
GFR calc Af Amer: 78 mL/min/{1.73_m2} (ref >59)
GFR calc non Af Amer: 67 mL/min/{1.73_m2} (ref >59)
Globulin, Total: 1.9 g/dL (ref 1.5–4.5)
Glucose: 60 mg/dL — ABNORMAL LOW (ref 65–99)
Potassium: 5.4 mmol/L — ABNORMAL HIGH (ref 3.5–5.2)
Sodium: 143 mmol/L (ref 134–144)
Total Protein: 6.3 g/dL (ref 6.0–8.5)

## 2018-08-03 ENCOUNTER — Ambulatory Visit: Payer: Medicare Other | Admitting: Dietician

## 2018-08-08 ENCOUNTER — Ambulatory Visit (INDEPENDENT_AMBULATORY_CARE_PROVIDER_SITE_OTHER): Payer: Medicare Other | Admitting: Podiatry

## 2018-08-08 ENCOUNTER — Other Ambulatory Visit: Payer: Self-pay

## 2018-08-08 ENCOUNTER — Encounter: Payer: Self-pay | Admitting: Podiatry

## 2018-08-08 VITALS — Temp 98.4°F

## 2018-08-08 DIAGNOSIS — E1065 Type 1 diabetes mellitus with hyperglycemia: Secondary | ICD-10-CM

## 2018-08-08 DIAGNOSIS — M79675 Pain in left toe(s): Secondary | ICD-10-CM

## 2018-08-08 DIAGNOSIS — B351 Tinea unguium: Secondary | ICD-10-CM

## 2018-08-08 DIAGNOSIS — M79674 Pain in right toe(s): Secondary | ICD-10-CM

## 2018-08-08 NOTE — Progress Notes (Signed)
Subjective:  Joyce Brown presents for preventative diabetic foot care on today's visit for follow up of painful, thick, discolored, elongated toenails 1-5 b/l that become tender.  She  cannot cut because of thickness.  Pain is aggravated when wearing enclosed shoe gear.  Joyce Covert, MD is her PCP. Last visit was 03/20/2018.  Last visit with Endocrinologist was 07/17/2018 via telephone.  Miss Joyce Brown states she has her diabetic shoes and are pleased with them. She voices no problems with them.   Current Outpatient Medications:  .  albuterol (PROVENTIL HFA;VENTOLIN HFA) 108 (90 Base) MCG/ACT inhaler, Inhale 1 puff into the lungs 2 (two) times daily., Disp: 1 Inhaler, Rfl: 2 .  atorvastatin (LIPITOR) 40 MG tablet, Take 1 tablet (40 mg total) by mouth daily at 6 PM., Disp: 90 tablet, Rfl: 3 .  Calcium Citrate 250 MG TABS, Take 2 tablets (500 mg total) by mouth 2 (two) times daily., Disp: 120 tablet, Rfl: 11 .  Cholecalciferol (VITAMIN D3) 75 MCG (3000 UT) TABS, Take by mouth., Disp: , Rfl:  .  Continuous Blood Gluc Receiver ([place]) DEVI, by Does not apply route., Disp: , Rfl:  .  Continuous Blood Gluc Sensor (DEXCOM G6 SENSOR) MISC, by Does not apply route., Disp: , Rfl:  .  fluticasone (FLOVENT HFA) 220 MCG/ACT inhaler, Inhale 1 puff into the lungs 2 (two) times daily., Disp: 3 Inhaler, Rfl: 4 .  ibuprofen (ADVIL,MOTRIN) 200 MG tablet, Take 400 mg by mouth 2 (two) times daily., Disp: , Rfl:  .  injection device for insulin (INPEN 100-PINK-NOVO) DEVI, As directed, Disp: 1 each, Rfl: 0 .  insulin aspart (NOVOLOG) cartridge, Max daily dose of 60 units, Disp: 18 mL, Rfl: 1 .  insulin glargine (LANTUS) 100 unit/mL SOPN, Inject 0.1 mLs (10 Units total) into the skin daily. Inject 12 units daily DX E10.65, Disp: 12 mL, Rfl: 11 .  Insulin Pen Needle (B-D UF III MINI PEN NEEDLES) 31G X 5 MM MISC, Four times a day, Disp: 150 each, Rfl: 11 .  MICROLET LANCETS MISC, Twice a day,  Disp: 100 each, Rfl: 11 .  Misc. Devices MISC, by Does not apply route. C PAP, Disp: , Rfl:  .  Multiple Vitamin (MULTIVITAMIN) capsule, Take 4 capsules by mouth daily. Bariatric fusion , Disp: , Rfl:  .  mycophenolate (CELLCEPT) 500 MG tablet, Take 2 tablets (1,000 mg total) by mouth 2 (two) times daily., Disp: 360 tablet, Rfl: 1 .  oxybutynin (DITROPAN) 5 MG tablet, TAKE ONE TABLET BY MOUTH TWICE A DAY, Disp: 180 tablet, Rfl: 0 .  sertraline (ZOLOFT) 100 MG tablet, TAKE ONE TABLET BY MOUTH DAILY, Disp: 90 tablet, Rfl: 0 .  tiZANidine (ZANAFLEX) 2 MG tablet, Take 2-4 mg by mouth at bedtime as needed for muscle spasms., Disp: , Rfl:  .  VALIUM 5 MG tablet, Take 1 tablet (5 mg total) by mouth 3 (three) times daily. (Patient taking differently: Take 5 mg by mouth. 1/2 tab in pm), Disp: 90 tablet, Rfl: 2   No Active Allergies   Objective: Vitals:   08/08/18 1042  Temp: 98.4 F (36.9 C)    Physical Examination:  Neurovascular Examination: Capillary refill time immediate x 10 digits.  Palpable DP/PT pulses b/l.  Digital hair absent  b/l.  No edema noted b/l.  Skin temperature gradient WNL b/l.  Dermatological Examination: Skin with normal turgor, texture and tone b/l.  No open wounds b/l.  No interdigital macerations noted b/l.  Elongated, thick,  discolored brittle toenails with subungual debris and pain on dorsal palpation of nailbeds 1-5 b/l.  No hyperkeratoses of heels on today's visit.  Musculoskeletal Examination: Muscle strength 5/5 to all muscle groups b/l  No pain, crepitus or joint discomfort with active/passive ROM.  HAV with bunion b/l.  Hammertoes 2-5 b/l.  Tailor's bunion deformity b/l.  Neurological Examination: Sensation intact 5/5 b/l with 10 gram monofilament.  Vibratory sensation intact b/l.  Proprioceptive sensation intact b/l.  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 Continue diabetic foot care principles. Literature dispensed on today. 2. Toenails 1-5 b/l were debrided in length and girth without iatrogenic laceration. 2.  Continue soft, supportive shoe gear daily. 3.  Report any pedal injuries to medical professional. 4.  Follow up 3 months. 5.  Patient/POA to call should there be a question/concern in there interim.

## 2018-08-08 NOTE — Patient Instructions (Signed)
Diabetes Mellitus and Foot Care Foot care is an important part of your health, especially when you have diabetes. Diabetes may cause you to have problems because of poor blood flow (circulation) to your feet and legs, which can cause your skin to:  Become thinner and drier.  Break more easily.  Heal more slowly.  Peel and crack. You may also have nerve damage (neuropathy) in your legs and feet, causing decreased feeling in them. This means that you may not notice minor injuries to your feet that could lead to more serious problems. Noticing and addressing any potential problems early is the best way to prevent future foot problems. How to care for your feet Foot hygiene  Wash your feet daily with warm water and mild soap. Do not use hot water. Then, pat your feet and the areas between your toes until they are completely dry. Do not soak your feet as this can dry your skin.  Trim your toenails straight across. Do not dig under them or around the cuticle. File the edges of your nails with an emery board or nail file.  Apply a moisturizing lotion or petroleum jelly to the skin on your feet and to dry, brittle toenails. Use lotion that does not contain alcohol and is unscented. Do not apply lotion between your toes. Shoes and socks  Wear clean socks or stockings every day. Make sure they are not too tight. Do not wear knee-high stockings since they may decrease blood flow to your legs.  Wear shoes that fit properly and have enough cushioning. Always look in your shoes before you put them on to be sure there are no objects inside.  To break in new shoes, wear them for just a few hours a day. This prevents injuries on your feet. Wounds, scrapes, corns, and calluses  Check your feet daily for blisters, cuts, bruises, sores, and redness. If you cannot see the bottom of your feet, use a mirror or ask someone for help.  Do not cut corns or calluses or try to remove them with medicine.  If you  find a minor scrape, cut, or break in the skin on your feet, keep it and the skin around it clean and dry. You may clean these areas with mild soap and water. Do not clean the area with peroxide, alcohol, or iodine.  If you have a wound, scrape, corn, or callus on your foot, look at it several times a day to make sure it is healing and not infected. Check for: ? Redness, swelling, or pain. ? Fluid or blood. ? Warmth. ? Pus or a bad smell. General instructions  Do not cross your legs. This may decrease blood flow to your feet.  Do not use heating pads or hot water bottles on your feet. They may burn your skin. If you have lost feeling in your feet or legs, you may not know this is happening until it is too late.  Protect your feet from hot and cold by wearing shoes, such as at the beach or on hot pavement.  Schedule a complete foot exam at least once a year (annually) or more often if you have foot problems. If you have foot problems, report any cuts, sores, or bruises to your health care provider immediately. Contact a health care provider if:  You have a medical condition that increases your risk of infection and you have any cuts, sores, or bruises on your feet.  You have an injury that is not   healing.  You have redness on your legs or feet.  You feel burning or tingling in your legs or feet.  You have pain or cramps in your legs and feet.  Your legs or feet are numb.  Your feet always feel cold.  You have pain around a toenail. Get help right away if:  You have a wound, scrape, corn, or callus on your foot and: ? You have pain, swelling, or redness that gets worse. ? You have fluid or blood coming from the wound, scrape, corn, or callus. ? Your wound, scrape, corn, or callus feels warm to the touch. ? You have pus or a bad smell coming from the wound, scrape, corn, or callus. ? You have a fever. ? You have a red line going up your leg. Summary  Check your feet every day  for cuts, sores, red spots, swelling, and blisters.  Moisturize feet and legs daily.  Wear shoes that fit properly and have enough cushioning.  If you have foot problems, report any cuts, sores, or bruises to your health care provider immediately.  Schedule a complete foot exam at least once a year (annually) or more often if you have foot problems. This information is not intended to replace advice given to you by your health care provider. Make sure you discuss any questions you have with your health care provider. Document Released: 02/27/2000 Document Revised: 04/13/2017 Document Reviewed: 04/02/2016 Elsevier Interactive Patient Education  2019 [employer].  Onychomycosis/Fungal Toenails  WHAT IS IT? An infection that lies within the keratin of your nail plate that is caused by a fungus.  WHY ME? Fungal infections affect all ages, sexes, races, and creeds.  There may be many factors that predispose you to a fungal infection such as age, coexisting medical conditions such as diabetes, or an autoimmune disease; stress, medications, fatigue, genetics, etc.  Bottom line: fungus thrives in a warm, moist environment and your shoes offer such a location.  IS IT CONTAGIOUS? Theoretically, yes.  You do not want to share shoes, nail clippers or files with someone who has fungal toenails.  Walking around barefoot in the same room or sleeping in the same bed is unlikely to transfer the organism.  It is important to realize, however, that fungus can spread easily from one nail to the next on the same foot.  HOW DO WE TREAT THIS?  There are several ways to treat this condition.  Treatment may depend on many factors such as age, medications, pregnancy, liver and kidney conditions, etc.  It is best to ask your doctor which options are available to you.  1. No treatment.   Unlike many other medical concerns, you can live with this condition.  However for many people this can be a painful condition and  may lead to ingrown toenails or a bacterial infection.  It is recommended that you keep the nails cut short to help reduce the amount of fungal nail. 2. Topical treatment.  These range from herbal remedies to prescription strength nail lacquers.  About 40-50% effective, topicals require twice daily application for approximately 9 to 12 months or until an entirely new nail has grown out.  The most effective topicals are medical grade medications available through physicians offices. 3. Oral antifungal medications.  With an 80-90% cure rate, the most common oral medication requires 3 to 4 months of therapy and stays in your system for a year as the new nail grows out.  Oral antifungal medications do require   blood work to make sure it is a safe drug for you.  A liver function panel will be performed prior to starting the medication and after the first month of treatment.  It is important to have the blood work performed to avoid any harmful side effects.  In general, this medication safe but blood work is required. 4. Laser Therapy.  This treatment is performed by applying a specialized laser to the affected nail plate.  This therapy is noninvasive, fast, and non-painful.  It is not covered by insurance and is therefore, out of pocket.  The results have been very good with a 80-95% cure rate.  [facility] is the only practice in the area to offer this therapy. 5. Permanent Nail Avulsion.  Removing the entire nail so that a new nail will not grow back. 

## 2018-08-10 ENCOUNTER — Ambulatory Visit (INDEPENDENT_AMBULATORY_CARE_PROVIDER_SITE_OTHER): Payer: Medicare Other | Admitting: Neurology

## 2018-08-10 ENCOUNTER — Encounter: Payer: Self-pay | Admitting: Neurology

## 2018-08-10 ENCOUNTER — Other Ambulatory Visit: Payer: Self-pay

## 2018-08-10 VITALS — BP 133/69 | HR 78 | Temp 97.7°F

## 2018-08-10 DIAGNOSIS — G4733 Obstructive sleep apnea (adult) (pediatric): Secondary | ICD-10-CM

## 2018-08-10 DIAGNOSIS — Z9884 Bariatric surgery status: Secondary | ICD-10-CM | POA: Diagnosis not present

## 2018-08-10 DIAGNOSIS — Z9989 Dependence on other enabling machines and devices: Secondary | ICD-10-CM

## 2018-08-10 DIAGNOSIS — R351 Nocturia: Secondary | ICD-10-CM

## 2018-08-10 DIAGNOSIS — R634 Abnormal weight loss: Secondary | ICD-10-CM | POA: Diagnosis not present

## 2018-08-10 DIAGNOSIS — G119 Hereditary ataxia, unspecified: Secondary | ICD-10-CM

## 2018-08-10 NOTE — Progress Notes (Signed)
Subjective:    Patient ID: Joyce Brown is a 68 y.o. female.  HPI     Joyce Age, MD, PhD [facility] [address], [address] [place], [place] [address]  Dear Joyce Brown,   I saw your patient, Joyce Brown, upon your kind request in my sleep clinic today for initial consultation of her sleep disorder, in particular, evaluation of her prior diagnosis of obstructive sleep apnea, to establish care with a new sleep provider.  The patient is unaccompanied today.  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  She had sleep study testing also subsequently, she has multiple neurology records, I was able to review the records she brought.  She has been compliant with her AutoPap machine.  She is currently on AutoPap at a pressure of 6-8 and fully compliant with treatment.  In the last 30 days from 07/11/2018 through 08/09/2018 she used her machine 29 days with percent used days greater than 4 hours at 93%, indicating excellent compliance with an average usage of 7 hours and 4 minutes, residual AHI 2.0, at goal, 95th percentile of pressure right at 7.9 cm, leak on the low side with a 95th percentile at 4.2 L/min.  She does well with her AutoPap.  She very occasionally will fall asleep without turning the machine on, she may even put the mask on but fall asleep before she actually turns the machine on.   She has overall done well with time, I can look back at her records for her sleep follow-up and she has always been compliant with treatment and benefited from treatment, denied excessive daytime somnolence, currently her Epworth sleepiness score is in fact low at 2 out of 24, fatigue severity score however elevated at 53 out of 63.  She lives alone, she has 2 cats, she is single, no children, she lives in an apartment.  She does not have a car, she relies on her family members to give her a ride or she uses scat.  She is mostly wheelchair-bound, she does walk some inside her apartment with the help of a walker.  She did fall in September 2019.  I reviewed your office note from 04/18/2018.  She has been tapering diazepam and since she has lowered it she indicates that her speech is a little better in fact.  She denies any alcohol use, does not drink caffeine daily, tries to hydrate well with water.  Bedtime is usually around 830 and rise time between 7 and 7:30 AM.  She uses a bedside commode at night, uses it once or twice per average night.  Her cat sleep on the queen size bed with her.  She has an adjustable bed, she does not have a TV in the bedroom.  She is not aware of any family history of sleep apnea.  She has an ophthalmologist, Dr. Kathlen Mody, she is scheduled for a full eye examination and visual field testing in August as I understand.  Since her weight loss surgery in 2014  she altogether lost about 124 pounds.  She originally had a CPAP pressure of 10 cm and the pressure was down adjusted with time.  She worked as a Database administrator in [place] and then moved to [place] and subsequently to [place] to be closer to family and she liked that area as well.  Her Past Medical History Is Significant For: Past Medical History:  Diagnosis Date  . Asthma   . Cerebellar ataxia ([place])   . Diabetes mellitus without complication ([place])   . Glaucoma   . Hyperlipidemia   .  Hypertension   . Pulmonary embolism ([place])   . Sleep apnea   . Spinal stenosis     Her Past Surgical History Is Significant For: Past Surgical History:  Procedure Laterality Date  . ABDOMINAL HYSTERECTOMY    . ADENOIDECTOMY    . ANKLE SURGERY Left   . BILATERAL CARPAL TUNNEL RELEASE    . ELBOW SURGERY    . GASTRIC BYPASS  2014  . HAND NEUROPLASTY    . TONSILLECTOMY      Her Family History Is Significant For: Family History  Problem Relation Brown of Onset  . Cervical cancer Mother   . Dementia Father   . Retinal detachment Father   . Heart attack Father   . Diabetes Father   . Stroke Father   . Hypertension Father   . Glaucoma Paternal Grandfather     Her Social History Is Significant For: Social History   Socioeconomic History  . Marital status: Single    Spouse name: Not on file  . Number of children: Not on file  . Years of education: Not on file  . Highest education level: Not on file  Occupational History  . Not on file  Social Needs  . Financial resource strain: Not on file  . Food insecurity:    Worry: Not on file    Inability: Not on file  . Transportation needs:    Medical: Not on file    Non-medical: Not on file  Tobacco Use  . Smoking status: Never Smoker  . Smokeless tobacco: Never Used  Substance and Sexual Activity  . Alcohol use: Never    Frequency: Never  . Drug use: Never  . Sexual activity: Not on file  Lifestyle  . Physical activity:    Days per week: Not on file    Minutes per session: Not on file  . Stress: Not on file  Relationships  . Social connections:    Talks on phone: Not on file    Gets together: Not on file    Attends religious service: Not on file    Active member of club or organization: Not on file    Attends meetings of clubs or organizations: Not on file    Relationship status: Not on file  Other Topics Concern  . Not on file  Social History Narrative  . Not on file    Her Allergies Are:  No Known  Allergies:   Her Current Medications Are:  Outpatient Encounter Medications as of 08/10/2018  Medication Sig  . albuterol (PROVENTIL HFA;VENTOLIN HFA) 108 (90 Base) MCG/ACT inhaler Inhale 1 puff into the lungs 2 (two) times daily.  Marland Kitchen atorvastatin (LIPITOR) 40 MG tablet Take 1 tablet (40 mg total) by mouth daily at 6 PM.  . Calcium Citrate 250 MG TABS Take 2 tablets (500 mg total) by mouth 2 (two) times daily.  . Cholecalciferol (VITAMIN D3) 75 MCG (3000 UT)  TABS Take by mouth.  . Continuous Blood Gluc Receiver ([place]) DEVI by Does not apply route.  . Continuous Blood Gluc Sensor (DEXCOM G6 SENSOR) MISC by Does not apply route.  . fluticasone (FLOVENT HFA) 220 MCG/ACT inhaler Inhale 1 puff into the lungs 2 (two) times daily.  Marland Kitchen ibuprofen (ADVIL,MOTRIN) 200 MG tablet Take 400 mg by mouth 2 (two) times daily.  . injection device for insulin (INPEN 100-PINK-NOVO) DEVI As directed  . insulin aspart (NOVOLOG) cartridge Max daily dose of 60 units  . insulin glargine (LANTUS) 100 unit/mL SOPN Inject 0.1 mLs (10 Units total) into the skin daily. Inject 12 units daily DX E10.65  . Insulin Pen Needle (B-D UF III MINI PEN NEEDLES) 31G X 5 MM MISC Four times a day  . MICROLET LANCETS MISC Twice a day  . Misc. Devices MISC by Does not apply route. C PAP  . Multiple Vitamin (MULTIVITAMIN) capsule Take 4 capsules by mouth daily. Bariatric fusion   . mycophenolate (CELLCEPT) 500 MG tablet Take 2 tablets (1,000 mg total) by mouth 2 (two) times daily.  Marland Kitchen oxybutynin (DITROPAN) 5 MG tablet TAKE ONE TABLET BY MOUTH TWICE A DAY  . sertraline (ZOLOFT) 100 MG tablet TAKE ONE TABLET BY MOUTH DAILY  . tiZANidine (ZANAFLEX) 2 MG tablet Take 2-4 mg by mouth at bedtime as needed for muscle spasms.  Marland Kitchen VALIUM 5 MG tablet Take 1 tablet (5 mg total) by mouth 3 (three) times daily. (Patient taking differently: Take 5 mg by mouth. 1/2 tab in pm)   No facility-administered encounter medications on file as of  08/10/2018.   :  Review of Systems:  Out of a complete 14 point review of systems, all are reviewed and negative with the exception of these symptoms as listed below: Review of Systems  Neurological:       Pt presents today to discuss her cpap. Pt needs a sleep provider. Pt's DME is [facility].  Epworth Sleepiness Scale 0= would never doze 1= slight chance of dozing 2= moderate chance of dozing 3= high chance of dozing  Sitting and reading: 1 Watching TV: 1 Sitting inactive in a public place (ex. Theater or meeting): 0 As a passenger in a car for an hour without a break: 0 Lying down to rest in the afternoon: 0 Sitting and talking to someone: 0 Sitting quietly after lunch (no alcohol): 0 In a car, while stopped in traffic: 0 Total: 2     Objective:  Neurological Exam  Physical Exam Physical Examination:   Vitals:   08/10/18 0841  BP: 133/69  Pulse: 78  Temp: 97.7 F (36.5 C)    General Examination: The patient is a very pleasant 68 y.o. female in no acute distress. She appears well-developed and well-nourished and well groomed. She is situated in her wheelchair.  She brought her AutoPap machine and records.  HEENT: Normocephalic, atraumatic, pupils are equal, round and reactive to light and accommodation. She has some difficulty tracking, some endpoint nystagmus noted, no diplopia mentioned, mild cataracts noted bilaterally, she has prism corrective eyeglasses.  Face is symmetric with no obvious facial asymmetry or abnormal involuntary facial dyskinesias or movements noted.  She has no voice tremor but is moderately dysarthric and speech is of a scanning nature.  She has no neck stiffness.  Airway examination reveals a small airway entry, small uvula, absence of tonsils, Mallampati class I, tongue protrudes centrally in palate elevates symmetrically.  Mouth is mildly dry.  Hearing is  grossly intact.  Chest: Clear to auscultation without wheezing, rhonchi or crackles  noted.  Heart: S1+S2+0, regular and normal without murmurs, rubs or gallops noted.   Abdomen: Soft, non-tender and non-distended with normal bowel sounds appreciated on auscultation.  Extremities: There is no pitting edema in the distal lower extremities bilaterally.   Skin: Warm and dry without trophic changes noted.  Musculoskeletal: exam reveals no obvious joint deformities, tenderness or joint swelling or erythema.   Neurologically:  Mental status: The patient is awake, alert and oriented in all 4 spheres. Her immediate and remote memory, attention, language skills and fund of knowledge are appropriate. There is no evidence of aphasia, agnosia, apraxia or anomia. Speech is clear with normal prosody and enunciation. Thought process is linear. Mood is normal and affect is normal.  Cranial nerves II - XII are as described above under HEENT exam. In addition: shoulder shrug is normal with equal shoulder height noted. Motor exam: Normal bulk, Global strength of 4-5, significant incoordination noted.  Cerebellar testing shows significant dysmetria with both upper extremities and intention tremor, left worse than right.  Heel-to-shin is moderately difficult for her, left more than right.  I did not have her stand or walk for me.  She did not bring her walker.  She is in her wheelchair Sensory exam: intact to light touch in the upper and lower extremities.   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  She was diagnosed several years ago and had interim sleep study testing 75+ years ago.  She is currently on AutoPap therapy with a fairly narrow pressure range but adequate treatment, she is fully compliant and continues to benefit from her treatment.  She uses a small fullface mask, ResMed F 10.  She is commended for her treatment adherence.  She may benefit from reevaluation with a sleep study as she also had some interim changes in particular, she had weight loss surgery and lost quite a bit of weight.  She may also be eligible for a new machine as it has been nearly 5 years since she got her last machine, set up date was early June 2015.  She is agreeable to pursuing a repeat sleep study for reevaluation and updating her diagnosis and to hopefully qualify for new equipment.  To that end, I ordered a sleep study. I plan to see her back after the sleep study is completed and encouraged her to call with any interim questions, concerns, problems or updates.   Thank you very much for allowing me to participate in the care of this nice patient. If I can be of any further assistance to you please do not hesitate to talk to me.  Sincerely,   Joyce Age, MD, PhD

## 2018-08-10 NOTE — Patient Instructions (Signed)
Thank you for choosing [facility] for your sleep related care! It was nice to meet you today! I appreciate that you entrust me with your sleep related healthcare concerns. I hope, I was able to address at least some of your concerns today, and that I can help you feel reassured and also get better.    Here is what we discussed today and what we came up with as our plan for you:    Based on your symptoms and your exam I believe you are still at risk for obstructive sleep apnea and would benefit from reevaluation as it has been over 5 years since the last test and you need new supplies and a new machine, hopefully. Therefore, I think we should proceed with a sleep study to determine how severe your sleep apnea is. If you have more than mild OSA, I want you to consider ongoing treatment with CPAP. Please remember, the risks and ramifications of moderate to severe obstructive sleep apnea or OSA are: Cardiovascular disease, including congestive heart failure, stroke, difficult to control hypertension, arrhythmias, and even type 2 diabetes has been linked to untreated OSA. Sleep apnea causes disruption of sleep and sleep deprivation in most cases, which, in turn, can cause recurrent headaches, problems with memory, mood, concentration, focus, and vigilance. Most people with untreated sleep apnea report excessive daytime sleepiness, which can affect their ability to drive. Please do not drive if you feel sleepy.   I will likely see you back after your sleep study to go over the test results and where to go from there. We will call you after your sleep study to advise about the results (most likely, you will hear from Delta, my nurse) and to set up an appointment at the time, as necessary.    Our sleep lab administrative assistant will call you to schedule your sleep study. If you don't hear back from her by about 2 weeks from now, please feel free to call her at [phone]. You can leave a  message with your phone number and concerns, if you get the voicemail box. She will call back as soon as possible.

## 2018-08-17 ENCOUNTER — Encounter: Payer: Medicare Other | Attending: Internal Medicine | Admitting: Dietician

## 2018-08-17 ENCOUNTER — Other Ambulatory Visit: Payer: Self-pay

## 2018-08-17 ENCOUNTER — Ambulatory Visit: Payer: Medicare Other | Admitting: Internal Medicine

## 2018-08-17 ENCOUNTER — Other Ambulatory Visit (INDEPENDENT_AMBULATORY_CARE_PROVIDER_SITE_OTHER): Payer: Medicare Other | Admitting: Internal Medicine

## 2018-08-17 ENCOUNTER — Encounter: Payer: Self-pay | Admitting: Dietician

## 2018-08-17 DIAGNOSIS — E108 Type 1 diabetes mellitus with unspecified complications: Secondary | ICD-10-CM | POA: Insufficient documentation

## 2018-08-17 DIAGNOSIS — IMO0002 Reserved for concepts with insufficient information to code with codable children: Secondary | ICD-10-CM

## 2018-08-17 DIAGNOSIS — E1065 Type 1 diabetes mellitus with hyperglycemia: Secondary | ICD-10-CM | POA: Insufficient documentation

## 2018-08-17 LAB — POCT GLYCOSYLATED HEMOGLOBIN (HGB A1C): Hemoglobin A1C: 5.9 % — AB (ref 4.0–5.6)

## 2018-08-17 NOTE — Progress Notes (Signed)
Medical Nutrition Therapy:  Appt start time: 4765 end time:  1115. This was an in person visit.  Patient was scheduled for a phone visit but decided to come to the office.    Assessment:  Primary concerns today: Patient was referred for a consistent carbohydrate diet.   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   Type 1 diabetes since 2007, asthma, cerebellar ataxia,  HTN, HLD, OSA- on C-pap and to get a new sleep study July 6.  She has had a Dexcom G-6 since 07/11/18.   She states that she has noted that when the "Low Glucose Soon" alert occurs her BG falls rapidly and as it hits 74, it falls even faster and an urgent low happens within 10 minutes.  She has noted that "quick sugar" food causes her BG to rise quickly and drop quickly.  We discussed using protein with a carbohydrate choice when her BG returns to normal to help stabilize the BG.  She drinks a bottle of Boost Glucose Control before bed to avoid lows during the night which sets off her CGM alerts).  This has 16 grams of carbohydrates and 16 grams protein and works well. Her time in range is 82% from May 24-May 30.  Average glucose 179.  High glucose is generally in the afternoon until after dinner. (rise with snack then dinner).  Her lowest glucose during this range was about 70 and generally is between breakfast and lunch. Based on her diet history (she enters all her meals and snacks into the inpen app and brought the reports) she is not getting much protein many times for breakfast and also eats cereal which drops her in a couple of hours.   A1C at point of care today was 5.9% decreased from 10.3% February 2020.  Medications include:  Novolog 14 units before Breakfast and lunch and 12 units before dinner, Lantus 10 units q HS.  She uses an INPen and likes this very much.  Patient lives alone.  She uses a wheel chair.  She is doing her own grocery shopping.  She uses [employer] transpiration for  medical appointments but is able to wheel herself to the grocery store.  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 She moved here from [place] last year to be closer to one of her sister's who lives in [place]. The pool at her apartment is to reopen outdoors.  Patient enjoys walking in the pool.   She last saw an RD in [place] about 11 months ago.   Preferred Learning Style:   No preference indicated   Learning Readiness:   Ready  Change in progress   DIETARY INTAKE:  Usual eating pattern includes 3 meals and 2 snacks per day.  24-hr recall:  B ( AM): cereal (unsweetened Cheerios and bran buds) almond milk berries OR scrambled eggs and cheese Snk ( AM):   L ( PM): TV dinner (lower fat and about 35 grams carbs) Snk ( PM): cereal, almond milk, berries OR granola bar OR peanut butter and 1/2 bagel OR salad D ( PM): cereal, almond milk, berries  OR greek yogurt and granola bar Snk ( PM): Boost Glucose Control Beverages: water, some juice, Boost glucose control  Usual physical activity: scoots herself in her wheelchair  Progress Towards Goal(s):  In progress.   Nutritional Diagnosis:  NB-1.1 Food and nutrition-related knowledge deficit As related to balance of carbohydrate, protein, and fast.  As evidenced  by diet hx.    Intervention:  Nutrition counseling and diabetes education initiated. Discussed Carb Counting by food group as method of portion control, reading food labels, and benefits of increased activity. Also discussed basic physiology of Diabetes, target BG ranges pre and post meals, and A1c.  Discussed importance of consistent carbohydrates and better balance of carbohydrates with meals and snacks.  Discussed that she is not taking insulin for a snack and to keep these small.  Plan: Aim for carbohydrates that are not processed (oats, sweet potatoes, brown rice, beans, bean or whole grain pasta for example) Aim  for 2-3 Carb Choices per meal (30-45 grams) +/- 1 either way  Aim for 1 Carbs per snack after treating a low and before bed or to prevent a low  Choose low carb snacks at other times Include protein in moderation with your meals and snacks Continue reading food labels for Total Carbohydrate of foods Consider  increasing your activity level by rolling or walking in the pool or arm chair exercises for 30 minutes daily as tolerated Contine checking BG at alternate times per day  Continue taking medication as directed by MD  Teaching Method Utilized:  Visual Auditory Hands on  Handouts given during visit include: How to Thrive:  A Guide for Your Journey with Diabetes by the ADA Carb Counting and Food Label handouts Meal Plan Card  Diabetes resources  Barriers to learning/adherence to lifestyle change: mobility, transportation  Demonstrated degree of understanding via:  Teach Back   Monitoring/Evaluation:  Dietary intake, exercise, label reading, and body weight in 1 month(s).

## 2018-08-17 NOTE — Patient Instructions (Addendum)
Aim for carbohydrates that are not processed (oats, sweet potatoes, brown rice, beans, bean or whole grain pasta for example) Aim for 2-3 Carb Choices per meal (30-45 grams) +/- 1 either way  Aim for 1 Carbs per snack after treating a low and before bed or to prevent a low  Choose low carb snacks at other times Include protein in moderation with your meals and snacks Continue reading food labels for Total Carbohydrate of foods Consider  increasing your activity level by rolling or walking in the pool or arm chair exercises for 30 minutes daily as tolerated Contine checking BG at alternate times per day  Continue taking medication as directed by MD

## 2018-08-22 ENCOUNTER — Institutional Professional Consult (permissible substitution): Payer: Medicare Other | Admitting: Neurology

## 2018-08-30 ENCOUNTER — Other Ambulatory Visit: Payer: Self-pay

## 2018-08-30 ENCOUNTER — Ambulatory Visit
Admission: RE | Admit: 2018-08-30 | Discharge: 2018-08-30 | Disposition: A | Discharge status: Home / Self Care | Payer: Medicare Other | Source: Ambulatory Visit | Attending: Family Medicine | Admitting: Family Medicine

## 2018-08-30 DIAGNOSIS — Z1231 Encounter for screening mammogram for malignant neoplasm of breast: Secondary | ICD-10-CM | POA: Diagnosis not present

## 2018-08-31 DIAGNOSIS — G2582 Stiff-man syndrome: Secondary | ICD-10-CM | POA: Diagnosis not present

## 2018-09-01 ENCOUNTER — Encounter: Payer: Self-pay | Admitting: Family Medicine

## 2018-09-01 ENCOUNTER — Encounter: Payer: Self-pay | Admitting: Internal Medicine

## 2018-09-01 NOTE — Telephone Encounter (Signed)
Please review pt message

## 2018-09-18 ENCOUNTER — Ambulatory Visit (INDEPENDENT_AMBULATORY_CARE_PROVIDER_SITE_OTHER): Payer: Medicare Other | Admitting: Neurology

## 2018-09-18 DIAGNOSIS — R0683 Snoring: Secondary | ICD-10-CM

## 2018-09-18 DIAGNOSIS — G4733 Obstructive sleep apnea (adult) (pediatric): Secondary | ICD-10-CM

## 2018-09-18 DIAGNOSIS — G119 Hereditary ataxia, unspecified: Secondary | ICD-10-CM

## 2018-09-18 DIAGNOSIS — R634 Abnormal weight loss: Secondary | ICD-10-CM

## 2018-09-18 DIAGNOSIS — R351 Nocturia: Secondary | ICD-10-CM

## 2018-09-18 DIAGNOSIS — G4720 Circadian rhythm sleep disorder, unspecified type: Secondary | ICD-10-CM

## 2018-09-18 DIAGNOSIS — Z9884 Bariatric surgery status: Secondary | ICD-10-CM

## 2018-09-18 DIAGNOSIS — G4761 Periodic limb movement disorder: Secondary | ICD-10-CM

## 2018-09-21 ENCOUNTER — Encounter: Payer: Medicare Other | Attending: Internal Medicine | Admitting: Dietician

## 2018-09-21 ENCOUNTER — Encounter: Payer: Self-pay | Admitting: Dietician

## 2018-09-21 DIAGNOSIS — E1065 Type 1 diabetes mellitus with hyperglycemia: Secondary | ICD-10-CM | POA: Insufficient documentation

## 2018-09-21 DIAGNOSIS — E108 Type 1 diabetes mellitus with unspecified complications: Secondary | ICD-10-CM | POA: Diagnosis not present

## 2018-09-21 DIAGNOSIS — IMO0002 Reserved for concepts with insufficient information to code with codable children: Secondary | ICD-10-CM

## 2018-09-21 NOTE — Patient Instructions (Addendum)
Great job on the changes that you have made! Continue the plan below.  Plan: Aim for carbohydrates that are not processed (oats, sweet potatoes, brown rice, beans, bean or whole grain pasta, bulgur wheat, quinoa, kamut for example) Aim for 2-3 Carb Choices per meal (30-45 grams) +/- 1 either way  Aim for 1 Carbs per snack after treating a low and before bed or to prevent a low  Choose low carb snacks at other times Include protein in moderation with your meals and snacks Continue reading food labels for Total Carbohydrate of foods Consider  increasing your activity level by rolling or walking in the pool or arm chair exercises for 30 minutes daily as tolerated Contine checking BG at alternate times per day  Continue taking medication as directed by MD

## 2018-09-21 NOTE — Progress Notes (Signed)
Medical Nutrition Therapy:  Appt start time: 0930 end time:  1000. This visit was completed via telephone due to the COVID-19 pandemic.   I spoke with patient and verified that I was speaking with the correct person with two patient identifiers (full name and date of birth).   I discussed the limitations related to this kind of visit and the patient is willing to proceed. Location:  She was at her home and I was in my office.  Assessment:  Primary concerns today: Patient was referred for a consistent carbohydrate diet.  Her last visit was 08/17/2018.  During today's follow up visit, we reviewed her meal plan.   Since last visit her Novolog has been decreased before lunch and dinner.   She has had fewer lows and is able to identify the reasons (busy day and did not eat lunch or not on time, slept through meal time, ate cereal, not enough carbohydrates or lack of a protein choice). She has been walking around her apartment property. She continues to drink the Boost Glucose Control prior to bed as this prevents lows overnight. She states that she found learning more about portion size helpful. She found a vegetable steamer that she can use in the microwave and has been working on preparing more vegetables. She has been planning her meals each Sunday afternoon for the week and states this has greatly helped. She reports weight of 167 lbs (165 lbs 1 month ago).  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   Type 1 diabetes since 2007, asthma, cerebellar ataxia,  HTN, HLD, OSA- on C-pap and sleep study July 6.  She has had a Dexcom G-6 since 07/11/18.  Inpen recently. A1C at point of care today was 5.9% decreased from 10.3% February 2020.  Medications include:  Novolog 14 units before Breakfast, 12 units before lunch and 10 units before dinner as well as sliding scale for hyperglycemia, Lantus 10 units q HS (8:30 pm).    Patient lives alone.  She uses a  wheel chair.  She is doing her own grocery shopping.  She uses [employer] trans[employer] for medical appointments but is able to wheel herself to the grocery store.  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 She moved here from [place] last year to be closer to one of her sister's who lives in [place]. The pool at her apartment is to reopen outdoors.  Patient enjoys walking in the pool.   She last saw an RD in [place] about 11 months ago.  Preferred Learning Style:   No preference indicated   Learning Readiness:   Ready  Change in progress  DIETARY INTAKE:  Usual eating pattern includes 3 meals and 1 snacks per day. 24-hr recall:  B ( AM):  Overnight oats (cooked), almonds or PB2 powder, melon and cottage cheese or yogurt OR MultiGrain Waffle with a no sugar apple butter, cottage cheese and melon OR scrambled eggs, thin toast, fruit and cottage cheese Snk ( AM): none  L ( PM):  Tuna or chicken salad made with hummus instead of mayo, thin toast, raw carrots Snk ( PM): none D ( PM): lean cuisine meal Snk ( PM): Boost Glucose Control Beverages: water, juice (when low), Boost glucose control  Usual physical activity: scoots herself in her wheelchair  Progress Towards Goal(s):  In progress.   Nutritional Diagnosis:  NB-1.1 Food and nutrition-related knowledge deficit As related to balance of carbohydrate, protein, and  fast.  As evidenced by diet hx.    Intervention:  Nutrition counseling and diabetes education continued.  Encouraged her in the changes that she made and offered suggestions about meal planning.  Plan: Aim for carbohydrates that are not processed (oats, sweet potatoes, brown rice, beans, bean or whole grain pasta for example) Aim for 2-3 Carb Choices per meal (30-45 grams) +/- 1 either way  Aim for 1 Carbs per snack after treating a low and before bed or to prevent a low  Choose low carb snacks at other times Include  protein in moderation with your meals and snacks Continue reading food labels for Total Carbohydrate of foods Consider  increasing your activity level by rolling or walking in the pool or arm chair exercises for 30 minutes daily as tolerated Contine checking BG at alternate times per day  Continue taking medication as directed by MD  Teaching Method Utilized:  Visual Auditory Hands on  Handouts given during previous visit include: How to Thrive:  A Guide for Your Journey with Diabetes by the ADA Carb Counting and Food Label handouts Meal Plan Card  Diabetes resources  Will mail Boost Coupons today  Barriers to learning/adherence to lifestyle change: mobility, transportation  Demonstrated degree of understanding via:  Teach Back   Monitoring/Evaluation:  Dietary intake, exercise, label reading, and body weight in 1 month(s).

## 2018-09-25 ENCOUNTER — Telehealth: Payer: Self-pay

## 2018-09-25 NOTE — Progress Notes (Signed)
Patient referred by Dr. Jannifer Franklin, seen by me on 08/10/18, diagnostic PSG on 09/18/18.   Please call and notify the patient that the recent sleep study did not show any significant obstructive sleep apnea. She had mild snoring. She did not go into dream sleep. Based on the test result, she does not have sleep apnea, may very well be d/t significant weight loss since her wt loss surgery in 2014.  She can try to sleep without her autoPAP and see how it goes. If she has significant recurrence of symptoms, symptoms such as snoring, morning headaches, significant nighttime need for urination, EDS, we can consider re-eval with a HST. For now, she can FU with Dr. Jannifer Franklin.  Thanks,  Star Age, MD, PhD [facility])

## 2018-09-25 NOTE — Telephone Encounter (Signed)
-----   Message from Star Age, MD sent at 09/25/2018  8:27 AM EDT ----- Patient referred by Dr. Jannifer Franklin, seen by me on 08/10/18, diagnostic PSG on 09/18/18.   Please call and notify the patient that the recent sleep study did not show any significant obstructive sleep apnea. She had mild snoring. She did not go into dream sleep. Based on the test result, she does not have sleep apnea, may very well be d/t significant weight loss since her wt loss surgery in 2014.  She can try to sleep without her autoPAP and see how it goes. If she has significant recurrence of symptoms, symptoms such as snoring, morning headaches, significant nighttime need for urination, EDS, we can consider re-eval with a HST. For now, she can FU with Dr. Jannifer Franklin.  Thanks,  Star Age, MD, PhD [facility])

## 2018-09-25 NOTE — Procedures (Signed)
PATIENT'S NAME:  Joyce Brown, Joyce Brown DOB:      [birth year]      MR#:    [id number]     DATE OF RECORDING: 09/18/2018 REFERRING M.D.:  Dr. 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 The patient endorsed the Epworth Sleepiness Scale at 2/24 points. The patient's weight 165 pounds with a height of 70 (inches), resulting in a BMI of 23.7 kg/m2. The patient's neck circumference measured 15.5 inches.  CURRENT MEDICATIONS: Proventil, Lipitor, Calcim citrate, Vitamin D3, Flovent, Advil, Novolog, Lantus, Multivitamin, Cellcept, Ditropan, Zoloft, Zanaflex, Valium.   PROCEDURE:  This is a multichannel digital polysomnogram utilizing the Somnostar 11.2 system.  Electrodes and sensors were applied and monitored per AASM Specifications.   EEG, EOG, Chin and Limb EMG, were sampled at 200 Hz.  ECG, Snore and Nasal Pressure, Thermal Airflow, Respiratory Effort, CPAP Flow and Pressure, Oximetry was sampled at 50 Hz. Digital video and audio were recorded.      BASELINE STUDY  Lights Out was at 20:51 and Lights On at 04:54.  Total recording time (TRT) was 483.5 minutes, with a total sleep time (TST) of 301.5 minutes.   The patient's sleep latency was 29 minutes, which is delayed. REM sleep was absent. The sleep efficiency was 62.4 %, which is reduced.     SLEEP ARCHITECTURE: WASO (Wake after sleep onset) was 142.5 minutes with several longer periods of wakefulness. There were 13 minutes in  Stage N1, 172 minutes Stage N2, 116.5 minutes Stage N3 and 0 minutes in Stage REM.  The percentage of Stage N1 was 4.3%, Stage N2 was 57.%, which is mildly increased, Stage N3 was 38.6%, which is increased, and Stage R (REM sleep) was absent. The arousals were noted as: 22 were spontaneous, 20 were associated with PLMs, 1 were associated with respiratory events.  RESPIRATORY ANALYSIS:  There were a total of 3 respiratory events:  0 obstructive apneas, 0 central apneas and 0 mixed apneas with a total of 0 apneas and an apnea index (AI) of 0 /hour. There were 3 hypopneas with a hypopnea index of .6 /hour. The patient also had 0 respiratory event related arousals (RERAs).      The total APNEA/HYPOPNEA INDEX (AHI) was .6/hour and the total RESPIRATORY DISTURBANCE INDEX was .6 /hour.  0 events occurred in REM sleep and 6 events in NREM. The REM AHI was n/a, versus a non-REM AHI of .6. The patient spent 232 minutes of total sleep time in the supine position and 70 minutes in non-supine.. The supine AHI was 0.8 versus a non-supine AHI of 0.0.  OXYGEN SATURATION & C02:  The Wake baseline 02 saturation was 94%, with the lowest being 91% (87% on technical report was an error, due to sensor loss briefly). Time spent below 89% saturation equaled 0 minutes.  PERIODIC LIMB MOVEMENTS: The patient had a total of 91 Periodic Limb Movements.  The Periodic Limb Movement (PLM) index was 18.1 and the PLM Arousal index was 4./hour.  Audio and video analysis did not show any abnormal or unusual movements, behaviors,  phonations or vocalizations. The patient took 1 bathroom break. Mild snoring was noted. The EKG was in keeping with normal sinus rhythm (NSR).  Post-study, the patient indicated that sleep was the same as usual.   IMPRESSION:  1. Primary Snoring 2. Periodic Limb Movement Disorder (PLMD) 3. Dysfunctions associated with sleep stages or arousal from sleep  RECOMMENDATIONS:  1. This study does not demonstrate  any significant obstructive or central sleep disordered breathing. Mild snoring was noted. The absence of REM sleep may underestimate her sleep disordered breathing. Based on significant weight loss surgery in 2014, the patient may not have OSA any longer. If she has symptoms of recurrence of snoring and excessive daytime sleepiness, she may benefit from re-evaluation with a home sleep test.  2. This study shows sleep fragmentation and abnormal sleep stage percentages; these are nonspecific findings and per se do not signify an intrinsic sleep disorder or a cause for the patient's sleep-related symptoms. Causes include (but are not limited to) the first night effect of the sleep study, circadian rhythm disturbances, medication effect or an underlying mood disorder or medical problem.  3. Mild PLMs (periodic limb movements of sleep) were noted during the study without significant arousals; clinical correlation is recommended. Medication effect from the patient's antidepressant medication should be considered.  4. The patient should be cautioned not to drive, work at heights, or operate dangerous or heavy equipment when tired or sleepy. Review and reiteration of good sleep hygiene measures should be pursued with any patient. 5. The patient will be advised to follow up with the referring provider, who will be notified of the test results.  I certify that I have reviewed the entire raw data recording prior to the issuance of this report in accordance with the Standards of Accreditation of the American Academy of Sleep Medicine (AASM)   Star Age, MD, PhD Diplomat, American Board of Neurology and Sleep Medicine (Neurology and Sleep Medicine)

## 2018-09-25 NOTE — Telephone Encounter (Signed)
I called pt, discussed her sleep study results and recommendations. Pt will follow up with Dr. Jannifer Franklin and let Dr. Rexene Alberts know if her sleep symptoms return without the autopap use. Pt verbalized understanding of results. Pt had no questions at this time but was encouraged to call back if questions arise.

## 2018-09-26 DIAGNOSIS — H40013 Open angle with borderline findings, low risk, bilateral: Secondary | ICD-10-CM | POA: Diagnosis not present

## 2018-09-29 ENCOUNTER — Encounter: Payer: Self-pay | Admitting: Family Medicine

## 2018-10-02 DIAGNOSIS — G2582 Stiff-man syndrome: Secondary | ICD-10-CM | POA: Diagnosis not present

## 2018-10-11 ENCOUNTER — Encounter: Payer: Self-pay | Admitting: Family Medicine

## 2018-10-11 DIAGNOSIS — Z9884 Bariatric surgery status: Secondary | ICD-10-CM

## 2018-10-12 ENCOUNTER — Other Ambulatory Visit: Payer: Self-pay

## 2018-10-12 ENCOUNTER — Other Ambulatory Visit: Payer: Self-pay | Admitting: Neurology

## 2018-10-17 ENCOUNTER — Encounter: Payer: Self-pay | Admitting: Internal Medicine

## 2018-10-17 ENCOUNTER — Ambulatory Visit (INDEPENDENT_AMBULATORY_CARE_PROVIDER_SITE_OTHER): Payer: Medicare Other | Admitting: Internal Medicine

## 2018-10-17 ENCOUNTER — Other Ambulatory Visit: Payer: Self-pay

## 2018-10-17 VITALS — BP 126/68 | HR 72 | Temp 98.4°F | Ht 70.0 in | Wt 162.8 lb

## 2018-10-17 DIAGNOSIS — E1065 Type 1 diabetes mellitus with hyperglycemia: Secondary | ICD-10-CM | POA: Diagnosis not present

## 2018-10-17 MED ORDER — INSULIN GLARGINE 100 UNITS/ML SOLOSTAR PEN: 10.0000 [IU] | PEN_INJECTOR | Freq: Every day | SUBCUTANEOUS | 3 refills | Status: DC

## 2018-10-17 MED ORDER — BD PEN NEEDLE MINI U/F 31G X 5 MM MISC: 3 refills | Status: DC

## 2018-10-17 MED ORDER — INSULIN ASPART 100 UNIT/ML CARTRIDGE (PENFILL): SUBCUTANEOUS | 3 refills | Status: DC

## 2018-10-17 NOTE — Patient Instructions (Signed)
Continue Lantus at 10 units daily  Novolog 15 units with Breakfast , , 12 units with Lunch and 10 units with supper     HOW TO TREAT LOW BLOOD SUGARS (Blood sugar LESS THAN 70 MG/DL)  Please follow the RULE OF 15 for the treatment of hypoglycemia treatment (when your (blood sugars are less than 70 mg/dL)    STEP 1: Take 15 grams of carbohydrates when your blood sugar is low, which includes:   3-4 GLUCOSE TABS  OR  3-4 OZ OF JUICE OR REGULAR SODA OR  ONE TUBE OF GLUCOSE GEL     STEP 2: RECHECK blood sugar in 15 MINUTES STEP 3: If your blood sugar is still low at the 15 minute recheck --> then, go back to STEP 1 and treat AGAIN with another 15 grams of carbohydrates.

## 2018-10-17 NOTE — Progress Notes (Signed)
Name: Joyce Brown  Age/ Sex: 68 y.o., female   MRN/ DOB: [id number], August 14, 1950     PCP: Lind Covert, MD   Reason for Endocrinology Evaluation: Type 1 Diabetes Mellitus  Initial Endocrine Consultative Visit: 01/17/2018    PATIENT IDENTIFIER: Joyce Brown is a 68 y.o. female with a past medical history of T1DM, cerebellar atrophy, asthma, OSA on CPAP and Hx of gastric bypass in 2014  . The patient has followed with Endocrinology clinic since 01/17/18 for consultative assistance with management of her diabetes.  Patient moved from [place] in September, 2019 to be close to her sister. She has been diagnosed with stiff person Syndrome but Dr. Jannifer Franklin this may be more of cerebellar atrophy. She is on IVIG at this time.   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 She has only had one DKA episode which was in 11/2017. Marland Kitchen Her hemoglobin A1c has ranged from 8.4% in 01/2017, peaking at 12.1 % in 01/2016  She is followed by Dr. 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 She has been off IVIG since 02/2018. He has been tapering her diazepam down and is currently on cellcept.    Started using the inpen 07/2018  SUBJECTIVE:   During the last visit (05/04/2018): Continued lantus at 12 units, increased prandial.     Today (10/17/2018): Ms. [name] is here for a 3 months follow up visit on diabetes.  She checks her blood sugars multiple times daily, using  CGM (Dexcom). The patient has had hypoglycemic episodes since the last clinic visit, which typically occur 1 x / week- most often occuring during the day . The patient is symptomatic with these episodes. Otherwise, the patient has not required any recent emergency interventions for hypoglycemia and hasnot had recent hospitalizations secondary to hyper or hypoglycemic episodes.    She has been using the in-pen and likes it a lot     ROS: As per HPI and as detailed below: Review of Systems  Respiratory: Negative for cough and shortness of breath.   Cardiovascular: Negative for chest pain and palpitations.  Gastrointestinal: Negative for diarrhea and nausea.  Genitourinary: Negative for frequency.  Musculoskeletal: Negative for joint pain and myalgias.  Endo/Heme/Allergies: Negative for polydipsia.      HOME DIABETES REGIMEN:  Lantus 10 units QHS Novolog 14 units  With Breakfast, 12 units with lunch and 10 units with supper  CF (BG - 110/45)    CONTINUOUS GLUCOSE MONITORING RECORD INTERPRETATION    Dates of Recording: 7/22- 10/17/2018 Sensor description:  Results statistics:   CGM use % of time 93  Average and SD 259/92  Time in range    22  %  % Time Above 180 25  % Time above 250 52  % Time Below target <1    Glycemic patterns summary: Hyperglycemia during the day , mainly after breakfast  Hyperglycemic episodes  Post-prandial   Hypoglycemic episodes occurred postprandial  Overnight periods: Stable     HISTORY:  Past Medical History:  Past Medical History:  Diagnosis Date  . Asthma   . Cerebellar ataxia ([place])   . Diabetes mellitus without complication ([place])   . Glaucoma   . Hyperlipidemia   . Hypertension   . Pulmonary embolism ([place])   . Sleep apnea   . Spinal stenosis    Past Surgical History:  Past  Surgical History:  Procedure Laterality Date  . ABDOMINAL HYSTERECTOMY    . ADENOIDECTOMY    . ANKLE SURGERY Left   . BILATERAL CARPAL TUNNEL RELEASE    . ELBOW SURGERY    . GASTRIC BYPASS  2014  . HAND NEUROPLASTY    . TONSILLECTOMY      Social History:  reports that she has never smoked. She has never used smokeless tobacco. She reports that she does not drink alcohol or use drugs. Family History:  Family History  Problem Relation Age of Onset  . Cervical cancer Mother   . Dementia Father   . Retinal detachment  Father   . Heart attack Father   . Diabetes Father   . Stroke Father   . Hypertension Father   . Glaucoma Paternal Grandfather      HOME MEDICATIONS: Allergies as of 10/17/2018   No Known Allergies     Medication List       Accurate as of October 17, 2018  7:37 AM. If you have any questions, ask your nurse or doctor.        albuterol 108 (90 Base) MCG/ACT inhaler Commonly known as: VENTOLIN HFA Inhale 1 puff into the lungs 2 (two) times daily.   atorvastatin 40 MG tablet Commonly known as: LIPITOR Take 1 tablet (40 mg total) by mouth daily at 6 PM.   Calcium Citrate 250 MG Tabs Take 2 tablets (500 mg total) by mouth 2 (two) times daily.   Dexcom G6 Receiver Devi by Does not apply route.   Dexcom G6 Sensor Misc by Does not apply route.   fluticasone 220 MCG/ACT inhaler Commonly known as: FLOVENT HFA Inhale 1 puff into the lungs 2 (two) times daily.   ibuprofen 200 MG tablet Commonly known as: ADVIL Take 400 mg by mouth 2 (two) times daily.   injection device for insulin Devi Commonly known as: InPen 100-Pink-Novo As directed   insulin aspart cartridge Commonly known as: NOVOLOG Max daily dose of 60 units   insulin glargine 100 unit/mL Sopn Commonly known as: LANTUS Inject 0.1 mLs (10 Units total) into the skin daily. Inject 12 units daily DX E10.65   Insulin Pen Needle 31G X 5 MM Misc Commonly known as: B-D UF III MINI PEN NEEDLES Four times a day   Microlet Lancets Misc Twice a day   Misc. Devices Misc by Does not apply route. C PAP   multivitamin capsule Take 4 capsules by mouth daily. Bariatric fusion   mycophenolate 500 MG tablet Commonly known as: CELLCEPT TAKE TWO TABLETS BY MOUTH TWICE A DAY   oxybutynin 5 MG tablet Commonly known as: DITROPAN TAKE ONE TABLET BY MOUTH TWICE A DAY   sertraline 100 MG tablet Commonly known as: ZOLOFT TAKE ONE TABLET BY MOUTH DAILY   tiZANidine 2 MG tablet Commonly known as: ZANAFLEX Take 2-4 mg by  mouth at bedtime as needed for muscle spasms.   Valium 5 MG tablet Generic drug: diazepam Take 1 tablet (5 mg total) by mouth 3 (three) times daily. What changed:   when to take this  additional instructions   Vitamin D3 75 MCG (3000 UT) Tabs Take by mouth.       PHYSICAL EXAM: VS: BP 126/68 (BP Location: Left Arm, Patient Position: Sitting, Cuff Size: Normal)   Pulse 72   Temp 98.4 F (36.9 C)   Ht _0  (1.778 m)   Wt 162 lb 12.8 oz (73.8 kg)   SpO2 97%  BMI 23.36 kg/m    EXAM: General: Pt appears well and is in NAD  Neck: General: Supple without adenopathy. Thyroid: Thyroid size normal.  No goiter or nodules appreciated. No thyroid bruit.  Lungs: Clear with good BS bilat with no rales, rhonchi, or wheezes  Heart: Auscultation: RRR with normal S1 and S2, no gallops or murmurs Carotid arteries: no bruits Periph. circulation: no peripheral edema  Abdomen: Normoactive bowel sounds, soft, nontender, without masses or organomegaly palpable  Extremities:  BL LE: no pretibial edema normal ROM and strength, no joint enlargement or tenderness  Skin: Hair: texture and amount normal with gender appropriate distribution Skin Inspection: no rashes. Skin Palpation: skin temperature, texture, and thickness normal to palpation  Neuro: Cranial nerves: II - XII grossly intact ;  Motor: normal strength throughout DTRs: 2+ and symmetric in UE without delay in relaxation phase  Mental Status: Judgment, insight: intact Mood and affect: no depression, anxiety, or agitation   DM Foot Exam 10/17/18 The skin of the feet is intact without sores or ulcerations. The pedal pulses are 2+ on right and 2+ on left. The sensation is intact to a screening 5.07, 10 gram monofilament bilaterally   DATA REVIEWED:  Lab Results  Component Value Date   HGBA1C 5.9 (A) 08/17/2018   HGBA1C 10.3 (A) 05/04/2018   HGBA1C 12.1 (A) 01/17/2018   Lab Results  Component Value Date   MICROALBUR 2.3 (H)  02/07/2018   CREATININE 0.90 08/01/2018       ASSESSMENT / PLAN / RECOMMENDATIONS:   1) Type 1 Diabetes Mellitus, poorly controlled, With Hx of hypoglycemia unawareness - Most recent A1c of 10.3  %. Goal A1c < 7.5 %.   Plan:  - In review of her meter download, the patient she continues with hyperglycemia with Breakfast  And supper.  - Despite denying snacks in th past, the patient was actually having some kind of boost at bedtime, which explains her 400-500 readings at bedtime in the past. This was changed since she met with our CDE.  - She is using the Novolog in-pen and loves it.  - During her office visit the Dexcom showed a BG of 76 then 71 and subsequently 51 mg/dL pt was symptomatic with a BG's in the 50's. We followed the rule of 15 for hypoglycemia , repeat Bg on finger stick was 134 mg/dL but on Dexcom 89 mg/dL. I have asked her to confirm  Her extremelu abnormal readings on Dexcom with a fingerstick as it seems her readings are falsely low.  - Will adjust her insilin ensitivity as below    MEDICATIONS:  Lantus to 10 units daily   Increase Novolgo to 15 units with breakfast and  Continue lunch 12 units , and 10 units with  supper   Sensitivity factor changed from 45 to 50     EDUCATION / INSTRUCTIONS:  BG monitoring instructions: Patient is instructed to check her blood sugars 4 times a day with CGM caliberation.  Call [facility] if: BG persistently < 70 or > 300. . I reviewed the Rule of 15 for the treatment of hypoglycemia in detail with the patient.     2) Diabetic complications:   Eye: Does not have known diabetic retinopathy.   Neuro/ Feet: Does not have known diabetic peripheral neuropathy.  Renal: Patient does not have known baseline CKD. She is not on an ACEI/ARB at present.       F/U in 3 months    Signed electronically  by: Mack Guise, MD  [facility]  [facility] [address]  [place], [place] [address] Phone: [phone] FAX: [phone]   CC: Lind Covert, [address] [place] [address] Phone: [phone]  Fax: [phone]  Return to Endocrinology clinic as below: Future Appointments  Date Time Provider [place]  10/17/2018  9:30 AM Shamleffer, Melanie Crazier, MD [facility] None  10/27/2018  9:30 AM Clydell Hakim, RD [facility] NDM  11/07/2018  9:30 AM Kathrynn Ducking, MD [facility] None  11/10/2018  9:45 AM Marzetta Board, DPM [facility]  11/15/2018  8:50 AM Erin Hearing Jeb Levering, MD [facility] [place]

## 2018-10-18 ENCOUNTER — Other Ambulatory Visit: Payer: Self-pay | Admitting: Family Medicine

## 2018-10-20 ENCOUNTER — Encounter: Payer: Self-pay | Admitting: Internal Medicine

## 2018-10-25 ENCOUNTER — Ambulatory Visit (INDEPENDENT_AMBULATORY_CARE_PROVIDER_SITE_OTHER): Payer: Medicare Other | Admitting: Family Medicine

## 2018-10-25 ENCOUNTER — Other Ambulatory Visit: Payer: Self-pay

## 2018-10-25 DIAGNOSIS — R070 Pain in throat: Secondary | ICD-10-CM

## 2018-10-25 NOTE — Patient Instructions (Addendum)
It was great to meet you today! Thank you for letting me participate in your care!  Today, we discussed your throat pain that is getting better. Continue to gargle with warm salt water, avoid products with alcohol, spicy, and hot foods. You can continue using antiseptic spray and Ibuprofen for throat pain.  Please call us if you begin experiencing symptoms of inability to swallow solids or liquids or if you begin choking.  Be well, Harolyn Rutherford, DO PGY-3, [facility]

## 2018-10-25 NOTE — Progress Notes (Signed)
     Subjective: Chief Complaint  Patient presents with  . swallowed hot food    lodged in throat    HPI: Joyce Brown is a 68 y.o. presenting to clinic today to discuss the following:  Throat Pain Patient states on 8/7 she swallowed a piece of hot potato and it "burned the back of my throat". It was very sore and it hurt to swallow for several days. She was always able to eat but only soft, cold or room temperature foods for the next several days. She began using warm salt water gargling, Listerine mouth wash, and an OTC throat numbing spray along with Ibuprofen for symptom control. She states the pain is getting better. She denies any choking on her foods and can eat both solids and liquids without difficulty. She is still avoiding hot and spicy foods. I advised her to avoid Listerine or anything that contains alcohol. She denies abdominal pain, nausea, vomiting, SOB, or any difficulty breathing.  ROS noted in HPI.   Past Medical, Surgical, Social, and Family History Reviewed & Updated per EMR.   Pertinent Historical Findings include:   Social History   Tobacco Use  Smoking Status Never Smoker  Smokeless Tobacco Never Used    Objective: BP 130/70   Pulse 97   SpO2 97%  Vitals and nursing notes reviewed  Physical Exam Gen: Alert and Oriented x 3, NAD HEENT: Normocephalic, atraumatic, PERRLA, EOMI, non-swollen, non-erythematous turbinates, non-erythematous pharyngeal mucosa, no exudates, no signs of mucosal damage to the pharynx. Ext: no clubbing, cyanosis, or edema Skin: warm, dry, intact, no rashes  Assessment/Plan:  Throat pain Secondary to minor burn from swallowing hot food. Pain is responding to conservative measures, able to swallow solids and liquids, and not endorsing any choking or aspiration. - Avoid Listerine or any alcohol containing products, spicy foods, and hot foods until her throat pain is resolved - cont conservative measures to treat symptoms;  Ibuprofen, lidocaine spray, and gargling with salt water - If no improvement in 2 weeks follow backup with [place] in the clinic  PATIENT EDUCATION PROVIDED: See AVS    Diagnosis and plan along with any newly prescribed medication(s) were discussed in detail with this patient today. The patient verbalized understanding and agreed with the plan. Patient advised if symptoms worsen return to clinic or ER.    Harolyn Rutherford, DO 10/25/2018, 2:54 PM PGY-3 [place]

## 2018-10-25 NOTE — Assessment & Plan Note (Signed)
Secondary to minor burn from swallowing hot food. Pain is responding to conservative measures, able to swallow solids and liquids, and not endorsing any choking or aspiration. - Avoid Listerine or any alcohol containing products, spicy foods, and hot foods until her throat pain is resolved - cont conservative measures to treat symptoms; Ibuprofen, lidocaine spray, and gargling with salt water - If no improvement in 2 weeks follow backup with [place] in the clinic

## 2018-10-26 ENCOUNTER — Ambulatory Visit: Payer: Medicare Other | Admitting: Neurology

## 2018-10-27 ENCOUNTER — Encounter: Payer: Medicare Other | Attending: Internal Medicine | Admitting: Dietician

## 2018-10-27 ENCOUNTER — Other Ambulatory Visit: Payer: Self-pay

## 2018-10-27 DIAGNOSIS — E108 Type 1 diabetes mellitus with unspecified complications: Secondary | ICD-10-CM | POA: Insufficient documentation

## 2018-10-27 DIAGNOSIS — E1065 Type 1 diabetes mellitus with hyperglycemia: Secondary | ICD-10-CM | POA: Insufficient documentation

## 2018-10-27 DIAGNOSIS — Z9884 Bariatric surgery status: Secondary | ICD-10-CM | POA: Insufficient documentation

## 2018-10-27 DIAGNOSIS — IMO0002 Reserved for concepts with insufficient information to code with codable children: Secondary | ICD-10-CM

## 2018-10-27 NOTE — Assessment & Plan Note (Signed)
Patient has been getting yearly labs.  Will refer to bariatric surgery for followup

## 2018-10-27 NOTE — Patient Instructions (Addendum)
Great job on documentation! Consider keeping a food journal that you can refer back to when you run your dexcom report. Great job on balanced meals! Continue to aim for 30-45 grams of carbohydrates per meal and a protein choice.  Great job on starting to exercise!

## 2018-10-27 NOTE — Progress Notes (Signed)
Medical Nutrition Therapy:  Appt start time: 0940 end time:  1010.   Assessment:  Primary concerns today: This is a follow up appointment for type 1 diabetes.  She was referred for a consistent carbohydrate diet.  Her last visit was a televisit 09/21/18.    Weight today 163 lbs stable. Last A1C was 5.9% 08/17/2018 (POC) but accuracy is questioned.  10.3% 04/2018. She continues to use the Dexcom G-6 and brought a 2 week report today. Her fasting BG remains high 100's to low 200's most of the time.   BG has been > 400 several times usually around lunch and dinner.  She has had 1 low in the past 2 weeks.  Also, she is having a lot of afternoon highs and concern that the Lantus is running out at that time although on occasion her BG will be low.    Medications includes:  Lantus 10 units q HS, Novolog 15 units before breakfast, 12 units before lunch, and 10 units before dinner.  Insulin sensitivity changed to 50 per patient. She also continues to get a correction dose calculated by the inpen.   She has been going to the gym (opened each day for those with medical necessity in her apartment complex) for 30 minutes 3 times per week.  She starts with the stationary bike then uses the equipment with weights. She states that Thursday, a hot potato was lodged in her throat with some burning.  She saw her MD and has been choosing very soft bland foods until this is healed.  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   Type 1 diabetes since 2007, asthma, cerebellar ataxia,  HTN, HLD, OSA- on C-pap and sleep study July 6.  She has had a Dexcom G-6 since 07/11/18.  Inpen recently. A1C at point of care 09/2018 was 5.9% decreased from 10.3% February 2020.  Medications include:  Novolog 14 units before Breakfast, 12 units before lunch and 10 units before dinner as well as sliding scale for hyperglycemia, Lantus 10 units q HS (8:30 pm).    Patient lives alone.  She uses a  wheel chair.  She is doing her own grocery shopping.  She uses [employer] trans[facility] for medical appointments but is able to wheel herself to the grocery store.  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 She moved here from [place] last year to be closer to one of her sister's who lives in [place]. The pool at her apartment is to reopen outdoors.  Patient enjoys walking in the pool.   She last saw an RD in [place] about 11 months ago.   Preferred Learning Style:   No preference indicated   Learning Readiness:  Ready  Change in progress   DIETARY INTAKE:  Usual eating pattern includes 3 meals.   24-hr recall:  B ( AM): overnight oats, almonds, PB2 powder, fresh fruit or2T raisins OR Eggs, [employer] toast, melon OR 2 [employer] waffles, unsweetened apple butter, fresh fruit Snk ( AM):   L ( PM): Watermelon salad with feta, 1 mini bagel , SF popsickle OR tuna made with humus, pumpernickel bread, fresh fruit OR [place], sweet potatoes, dressing, unsweetened applesauce, cottage cheese Snk ( PM): baby carrots D ( PM): [place] meatloaf, Potatoes, green beans, 2 sheets graham crackers OR chicken souviaki bowl, sm corn on the cob, green beans, SF popsicle OR Healthy Choice Power Bowl, Melon OR Cran/apple chicken, orzo, spinach, fresh peach Snk (  PM):  Beverages: Water, juice when low, stopped Boost glucose control  Progress Towards Goal(s):  In progress.   Nutritional Diagnosis:  NB-1.1 Food and nutrition-related knowledge deficit As related to balance of carbohydrate, protein, and fat,.  As evidenced by diet hx and patient report.    Intervention:  Nutrition counseling/education continued.  Encouraged her in the improved meal plans and discussed areas to address such as adding a small amount of protein with some meals that were lacking.  Discussed to continue to journal and compare to dexcom readings to see the effect of the meal on BG control.   Called  patient as well (7/18) after further review of the InPen report for the previous 2 weeks.  The report states that she has missed an insulin dose 11% of the time.  She states that she has to manually enter her Lantus bolus and at times forgets to enter it in the InPen or it does not accept this.  She denies missing doses.  She uses the InPen before meals.  She is properly rotating her injection sites and states that she has not site issues.  She has begun putting an overly patch on the Dexcom Sensor in warmer weather and this has improved the sensor life.  She also has begun to pay attention to her dexcom readings and meal impact.  She states that based on this, she is changing some of the foods that she is eating.  She is using greek yogurt on the waffle rather than unsweetened apple butter and is experimenting with a chia seed pudding.  We discussed that an increase in BG of 40-60 points 2 hours after the start of the meal is normal.  Discussed the many factors that effect the BG as well such as pain, stress, sleep, exercise.  Great job on documentation! Consider keeping a food journal that you can refer back to when you run your dexcom report. Great job on balanced meals! Continue to aim for 30-45 grams of carbohydrates per meal and a protein choice. Great job on starting to exercise!  Teaching Method Utilized:  Auditory  Barriers to learning/adherence to lifestyle change: physical health  Demonstrated degree of understanding via:  Teach Back   Monitoring/Evaluation:  Dietary intake, exercise, label reading, and body weight in 2 month(s).

## 2018-10-30 DIAGNOSIS — G2582 Stiff-man syndrome: Secondary | ICD-10-CM | POA: Diagnosis not present

## 2018-11-01 ENCOUNTER — Encounter: Payer: Self-pay | Admitting: Neurology

## 2018-11-07 ENCOUNTER — Encounter: Payer: Self-pay | Admitting: Neurology

## 2018-11-07 ENCOUNTER — Other Ambulatory Visit: Payer: Self-pay

## 2018-11-07 ENCOUNTER — Ambulatory Visit (INDEPENDENT_AMBULATORY_CARE_PROVIDER_SITE_OTHER): Payer: Medicare Other | Admitting: Neurology

## 2018-11-07 VITALS — BP 128/71 | HR 67 | Temp 98.0°F | Ht 70.0 in | Wt 159.0 lb

## 2018-11-07 DIAGNOSIS — R1312 Dysphagia, oropharyngeal phase: Secondary | ICD-10-CM

## 2018-11-07 DIAGNOSIS — G119 Hereditary ataxia, unspecified: Secondary | ICD-10-CM | POA: Diagnosis not present

## 2018-11-07 DIAGNOSIS — Z95828 Presence of other vascular implants and grafts: Secondary | ICD-10-CM

## 2018-11-07 DIAGNOSIS — R269 Unspecified abnormalities of gait and mobility: Secondary | ICD-10-CM

## 2018-11-07 DIAGNOSIS — Z5181 Encounter for therapeutic drug level monitoring: Secondary | ICD-10-CM | POA: Diagnosis not present

## 2018-11-07 HISTORY — DX: Unspecified abnormalities of gait and mobility: R26.9

## 2018-11-07 NOTE — Progress Notes (Signed)
Reason for visit: Cerebellar ataxia  Joyce Brown is an 68 y.o. 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  The patient has tapered off of her diazepam, she is no longer on IVIG.  She remains on CellCept therapy.  The patient has not had any worsening of her clinical condition off of IVIG.  She still has a Port-A-Cath in place.  Approximately 3 weeks ago, she burned the right side of her throat with a piece of hot potato, she has had some trouble with swallowing since that time, she is having difficulty choking on saliva, difficulty with swallowing her pills.  She has more problems with solids than liquids.  She reports no double vision, she has significant ataxia with gait, she does walk short distances with a walker.  She has fallen on 2 occasions within the last 3 months.  She does have a life alert system, she lives alone.  She has diabetes and she has gotten her hemoglobin A1c down to around 5.9.  She returns for an evaluation.  She reports some left foot pain in the ball of the foot with weightbearing.  She is followed through orthopedic surgery for her left hip pain as well.  This has improved gradually over time.  Past Medical History:  Diagnosis Date  . Asthma   . Cerebellar ataxia ([place])   . Diabetes mellitus without complication ([place])   . Glaucoma   . Hyperlipidemia   . Hypertension   . Pulmonary embolism ([place])   . Sleep apnea   . Spinal stenosis     Past Surgical History:  Procedure Laterality Date  . ABDOMINAL HYSTERECTOMY    . ADENOIDECTOMY    . ANKLE SURGERY Left   . BILATERAL CARPAL TUNNEL RELEASE    . ELBOW SURGERY    . GASTRIC BYPASS  2014  . HAND NEUROPLASTY    . TONSILLECTOMY      Family History  Problem Relation Age of Onset  . Cervical cancer Mother   . Ovarian cancer Mother   . Dementia Father   . Retinal  detachment Father   . Heart attack Father   . Diabetes Father   . Stroke Father   . Hypertension Father   . Glaucoma Paternal Grandfather   . Ovarian cancer Sister     Social history:  reports that she has never smoked. She has never used smokeless tobacco. She reports that she does not drink alcohol or use drugs.   No Known Allergies  Medications:  Prior to Admission medications   Medication Sig Start Date End Date Taking? Authorizing Provider  albuterol (PROVENTIL HFA;VENTOLIN HFA) 108 (90 Base) MCG/ACT inhaler Inhale 1 puff into the lungs 2 (two) times daily. 03/20/18  Yes Lind Covert, MD  atorvastatin (LIPITOR) 40 MG tablet Take 1 tablet (40 mg total) by mouth daily at 6 PM. 12/28/17  Yes Chambliss, Jeb Levering, MD  Calcium Citrate 250 MG TABS Take 2 tablets (500 mg total) by mouth 2 (two) times daily. 01/17/18  Yes Shamleffer, Melanie Crazier, MD  Cholecalciferol (VITAMIN D3) 75 MCG (3000 UT) TABS Take by mouth.   Yes [provider]  Continuous Blood Gluc Receiver ([place]) [place] by Does not apply route.   Yes [provider]  Continuous Blood Gluc Sensor (DEXCOM G6 SENSOR) MISC by Does not apply route.   Yes [provider]  fluticasone (FLOVENT HFA) 220 MCG/ACT inhaler Inhale 1 puff into the lungs 2 (two) times daily. 03/20/18  Yes Chambliss, Jeb Levering, MD  ibuprofen (ADVIL,MOTRIN) 200 MG tablet Take 400 mg by mouth 2 (two) times daily.   Yes [provider]  injection device for insulin (INPEN 100-PINK-NOVO) DEVI As directed 07/17/18  Yes Shamleffer, Melanie Crazier, MD  insulin aspart (NOVOLOG) cartridge Inject 15 Units into the skin daily with breakfast AND 12 Units daily with lunch AND 10 Units daily with supper. Max daily dose of 60 units. 10/17/18  Yes Shamleffer, Melanie Crazier, MD  insulin glargine (LANTUS) 100 unit/mL SOPN Inject 0.1 mLs (10 Units total) into the skin daily. Inject 12 units daily DX E10.65 10/17/18  Yes  Shamleffer, Melanie Crazier, MD  Insulin Pen Needle (B-D UF III MINI PEN NEEDLES) 31G X 5 MM MISC Four times a day 10/17/18  Yes Shamleffer, Melanie Crazier, MD  MICROLET LANCETS MISC Twice a day 02/07/18  Yes Shamleffer, Melanie Crazier, MD  Multiple Vitamin (MULTIVITAMIN) capsule Take 4 capsules by mouth daily. Bariatric fusion    Yes [provider]  mycophenolate (CELLCEPT) 500 MG tablet TAKE TWO TABLETS BY MOUTH TWICE A DAY 10/12/18  Yes Kathrynn Ducking, MD  oxybutynin (DITROPAN) 5 MG tablet TAKE ONE TABLET BY MOUTH TWICE A DAY 10/18/18  Yes Chambliss, Jeb Levering, MD  sertraline (ZOLOFT) 100 MG tablet TAKE ONE TABLET BY MOUTH DAILY 10/18/18  Yes Chambliss, Jeb Levering, MD  tiZANidine (ZANAFLEX) 2 MG tablet Take 2-4 mg by mouth at bedtime as needed for muscle spasms.   Yes [provider]  Misc. Devices MISC by Does not apply route. C PAP    [provider]  VALIUM 5 MG tablet Take 1 tablet (5 mg total) by mouth 3 (three) times daily. Patient taking differently: Take 5 mg by mouth. 1/2 tab in pm 01/19/18   Kathrynn Ducking, MD    ROS:  Out of a complete 14 system review of symptoms, the patient complains only of the following symptoms, and all other reviewed systems are negative.  Walking difficulty Left foot, left hip pain Swallowing problems  Blood pressure 128/71, pulse 67, temperature 98 F (36.7 C), height 5\' 10"  (1.778 m), weight 159 lb (72.1 kg).  Physical Exam  General: The patient is alert and cooperative at the time of the examination.  Skin: No significant peripheral edema is noted.   Neurologic Exam  Mental status: The patient is alert and oriented x 3 at the time of the examination. The patient has apparent normal recent and remote memory, with an apparently normal attention span and concentration ability.   Cranial nerves: Facial symmetry is present. Speech is ataxic, not aphasic. Extraocular movements are full.  Some end gaze nystagmus is  seen bilaterally.  Visual fields are full.  Motor: The patient has good strength in all 4 extremities.  Sensory examination: Soft touch sensation is symmetric on the face, arms, and legs.  Coordination: The patient has dysmetria with finger-nose-finger and heel shin bilaterally, worse with the lower extremities.  Gait and station: The patient has a wide-based, ataxic gait, she can walk short distances with assistance.  She is very unsteady.  Reflexes: Deep tendon reflexes are symmetric.   Assessment/Plan:  1.  Cerebellar ataxia syndrome, anti-gad antibody positivity  2.  Reports of dysphagia  3.  Left foot pain, likely arthritic  4.  Port-A-Cath in place  The patient will be referred to vascular surgery  for removal of the Port-A-Cath.  The patient has done well off of IVIG.  She will continue her CellCept, we will check blood work today.  The patient will discuss her left foot pain with her orthopedic surgeon.  We will set her up for a speech therapy evaluation for a modified barium swallow for her swallowing.  She will follow-up here in 6 months.  Jill Alexanders MD 11/07/2018 10:11 AM  [facility] [address] [place], [place] [address]  Phone [phone] Fax [phone]

## 2018-11-08 ENCOUNTER — Encounter: Payer: Self-pay | Admitting: Family Medicine

## 2018-11-08 LAB — CBC WITH DIFFERENTIAL/PLATELET
Basophils Absolute: 0 10*3/uL (ref 0.0–0.2)
Basos: 1 %
EOS (ABSOLUTE): 0.1 10*3/uL (ref 0.0–0.4)
Eos: 2 %
Hematocrit: 41.5 % (ref 34.0–46.6)
Hemoglobin: 12.4 g/dL (ref 11.1–15.9)
Immature Grans (Abs): 0 10*3/uL (ref 0.0–0.1)
Immature Granulocytes: 0 %
Lymphocytes Absolute: 1 10*3/uL (ref 0.7–3.1)
Lymphs: 19 %
MCH: 28.7 pg (ref 26.6–33.0)
MCHC: 29.9 g/dL — ABNORMAL LOW (ref 31.5–35.7)
MCV: 96 fL (ref 79–97)
Monocytes Absolute: 0.4 10*3/uL (ref 0.1–0.9)
Monocytes: 7 %
Neutrophils Absolute: 3.8 10*3/uL (ref 1.4–7.0)
Neutrophils: 71 %
Platelets: 232 10*3/uL (ref 150–450)
RBC: 4.32 x10E6/uL (ref 3.77–5.28)
RDW: 12.4 % (ref 11.7–15.4)
WBC: 5.4 10*3/uL (ref 3.4–10.8)

## 2018-11-08 LAB — COMPREHENSIVE METABOLIC PANEL
ALT: 20 IU/L (ref 0–32)
AST: 17 IU/L (ref 0–40)
Albumin/Globulin Ratio: 2.4 — ABNORMAL HIGH (ref 1.2–2.2)
Albumin: 4.4 g/dL (ref 3.8–4.8)
Alkaline Phosphatase: 61 IU/L (ref 39–117)
BUN/Creatinine Ratio: 18 (ref 12–28)
BUN: 18 mg/dL (ref 8–27)
Bilirubin Total: 0.3 mg/dL (ref 0.0–1.2)
CO2: 24 mmol/L (ref 20–29)
Calcium: 10.2 mg/dL (ref 8.7–10.3)
Chloride: 102 mmol/L (ref 96–106)
Creatinine, Ser: 0.98 mg/dL (ref 0.57–1.00)
GFR calc Af Amer: 70 mL/min/{1.73_m2} (ref >59)
GFR calc non Af Amer: 60 mL/min/{1.73_m2} (ref >59)
Globulin, Total: 1.8 g/dL (ref 1.5–4.5)
Glucose: 392 mg/dL — ABNORMAL HIGH (ref 65–99)
Potassium: 5.6 mmol/L — ABNORMAL HIGH (ref 3.5–5.2)
Sodium: 140 mmol/L (ref 134–144)
Total Protein: 6.2 g/dL (ref 6.0–8.5)

## 2018-11-09 ENCOUNTER — Other Ambulatory Visit: Payer: Self-pay | Admitting: Neurology

## 2018-11-09 ENCOUNTER — Other Ambulatory Visit (HOSPITAL_COMMUNITY): Payer: Self-pay | Admitting: *Deleted

## 2018-11-09 DIAGNOSIS — R1310 Dysphagia, unspecified: Secondary | ICD-10-CM

## 2018-11-09 DIAGNOSIS — Z95828 Presence of other vascular implants and grafts: Secondary | ICD-10-CM

## 2018-11-10 ENCOUNTER — Encounter: Payer: Self-pay | Admitting: Podiatry

## 2018-11-10 ENCOUNTER — Ambulatory Visit (INDEPENDENT_AMBULATORY_CARE_PROVIDER_SITE_OTHER): Payer: Self-pay | Admitting: Podiatry

## 2018-11-10 ENCOUNTER — Other Ambulatory Visit: Payer: Self-pay

## 2018-11-10 DIAGNOSIS — E119 Type 2 diabetes mellitus without complications: Secondary | ICD-10-CM

## 2018-11-10 DIAGNOSIS — IMO0001 Reserved for inherently not codable concepts without codable children: Secondary | ICD-10-CM

## 2018-11-10 DIAGNOSIS — M79675 Pain in left toe(s): Secondary | ICD-10-CM

## 2018-11-10 DIAGNOSIS — Z794 Long term (current) use of insulin: Secondary | ICD-10-CM

## 2018-11-10 DIAGNOSIS — B351 Tinea unguium: Secondary | ICD-10-CM

## 2018-11-10 DIAGNOSIS — M79674 Pain in right toe(s): Secondary | ICD-10-CM

## 2018-11-10 NOTE — Patient Instructions (Signed)
Diabetes Mellitus and Foot Care Foot care is an important part of your health, especially when you have diabetes. Diabetes may cause you to have problems because of poor blood flow (circulation) to your feet and legs, which can cause your skin to:  Become thinner and drier.  Break more easily.  Heal more slowly.  Peel and crack. You may also have nerve damage (neuropathy) in your legs and feet, causing decreased feeling in them. This means that you may not notice minor injuries to your feet that could lead to more serious problems. Noticing and addressing any potential problems early is the best way to prevent future foot problems. How to care for your feet Foot hygiene  Wash your feet daily with warm water and mild soap. Do not use hot water. Then, pat your feet and the areas between your toes until they are completely dry. Do not soak your feet as this can dry your skin.  Trim your toenails straight across. Do not dig under them or around the cuticle. File the edges of your nails with an emery board or nail file.  Apply a moisturizing lotion or petroleum jelly to the skin on your feet and to dry, brittle toenails. Use lotion that does not contain alcohol and is unscented. Do not apply lotion between your toes. Shoes and socks  Wear clean socks or stockings every day. Make sure they are not too tight. Do not wear knee-high stockings since they may decrease blood flow to your legs.  Wear shoes that fit properly and have enough cushioning. Always look in your shoes before you put them on to be sure there are no objects inside.  To break in new shoes, wear them for just a few hours a day. This prevents injuries on your feet. Wounds, scrapes, corns, and calluses  Check your feet daily for blisters, cuts, bruises, sores, and redness. If you cannot see the bottom of your feet, use a mirror or ask someone for help.  Do not cut corns or calluses or try to remove them with medicine.  If you  find a minor scrape, cut, or break in the skin on your feet, keep it and the skin around it clean and dry. You may clean these areas with mild soap and water. Do not clean the area with peroxide, alcohol, or iodine.  If you have a wound, scrape, corn, or callus on your foot, look at it several times a day to make sure it is healing and not infected. Check for: ? Redness, swelling, or pain. ? Fluid or blood. ? Warmth. ? Pus or a bad smell. General instructions  Do not cross your legs. This may decrease blood flow to your feet.  Do not use heating pads or hot water bottles on your feet. They may burn your skin. If you have lost feeling in your feet or legs, you may not know this is happening until it is too late.  Protect your feet from hot and cold by wearing shoes, such as at the beach or on hot pavement.  Schedule a complete foot exam at least once a year (annually) or more often if you have foot problems. If you have foot problems, report any cuts, sores, or bruises to your health care provider immediately. Contact a health care provider if:  You have a medical condition that increases your risk of infection and you have any cuts, sores, or bruises on your feet.  You have an injury that is not   healing.  You have redness on your legs or feet.  You feel burning or tingling in your legs or feet.  You have pain or cramps in your legs and feet.  Your legs or feet are numb.  Your feet always feel cold.  You have pain around a toenail. Get help right away if:  You have a wound, scrape, corn, or callus on your foot and: ? You have pain, swelling, or redness that gets worse. ? You have fluid or blood coming from the wound, scrape, corn, or callus. ? Your wound, scrape, corn, or callus feels warm to the touch. ? You have pus or a bad smell coming from the wound, scrape, corn, or callus. ? You have a fever. ? You have a red line going up your leg. Summary  Check your feet every day  for cuts, sores, red spots, swelling, and blisters.  Moisturize feet and legs daily.  Wear shoes that fit properly and have enough cushioning.  If you have foot problems, report any cuts, sores, or bruises to your health care provider immediately.  Schedule a complete foot exam at least once a year (annually) or more often if you have foot problems. This information is not intended to replace advice given to you by your health care provider. Make sure you discuss any questions you have with your health care provider. Document Released: 02/27/2000 Document Revised: 04/13/2017 Document Reviewed: 04/02/2016 Elsevier Patient Education  2020 [employer]   Onychomycosis/Fungal Toenails  WHAT IS IT? An infection that lies within the keratin of your nail plate that is caused by a fungus.  WHY ME? Fungal infections affect all ages, sexes, races, and creeds.  There may be many factors that predispose you to a fungal infection such as age, coexisting medical conditions such as diabetes, or an autoimmune disease; stress, medications, fatigue, genetics, etc.  Bottom line: fungus thrives in a warm, moist environment and your shoes offer such a location.  IS IT CONTAGIOUS? Theoretically, yes.  You do not want to share shoes, nail clippers or files with someone who has fungal toenails.  Walking around barefoot in the same room or sleeping in the same bed is unlikely to transfer the organism.  It is important to realize, however, that fungus can spread easily from one nail to the next on the same foot.  HOW DO WE TREAT THIS?  There are several ways to treat this condition.  Treatment may depend on many factors such as age, medications, pregnancy, liver and kidney conditions, etc.  It is best to ask your doctor which options are available to you.  1. No treatment.   Unlike many other medical concerns, you can live with this condition.  However for many people this can be a painful condition and may lead to  ingrown toenails or a bacterial infection.  It is recommended that you keep the nails cut short to help reduce the amount of fungal nail. 2. Topical treatment.  These range from herbal remedies to prescription strength nail lacquers.  About 40-50% effective, topicals require twice daily application for approximately 9 to 12 months or until an entirely new nail has grown out.  The most effective topicals are medical grade medications available through physicians offices. 3. Oral antifungal medications.  With an 80-90% cure rate, the most common oral medication requires 3 to 4 months of therapy and stays in your system for a year as the new nail grows out.  Oral antifungal medications do require   blood work to make sure it is a safe drug for you.  A liver function panel will be performed prior to starting the medication and after the first month of treatment.  It is important to have the blood work performed to avoid any harmful side effects.  In general, this medication safe but blood work is required. 4. Laser Therapy.  This treatment is performed by applying a specialized laser to the affected nail plate.  This therapy is noninvasive, fast, and non-painful.  It is not covered by insurance and is therefore, out of pocket.  The results have been very good with a 80-95% cure rate.  The [facility] is the only practice in the area to offer this therapy. 5. Permanent Nail Avulsion.  Removing the entire nail so that a new nail will not grow back. 

## 2018-11-14 ENCOUNTER — Ambulatory Visit (HOSPITAL_COMMUNITY)
Admission: RE | Admit: 2018-11-14 | Discharge: 2018-11-14 | Disposition: A | Discharge status: Home / Self Care | Payer: Medicare Other | Source: Ambulatory Visit | Attending: Neurology | Admitting: Neurology

## 2018-11-14 ENCOUNTER — Other Ambulatory Visit: Payer: Self-pay

## 2018-11-14 DIAGNOSIS — R1312 Dysphagia, oropharyngeal phase: Secondary | ICD-10-CM | POA: Diagnosis not present

## 2018-11-14 DIAGNOSIS — R1310 Dysphagia, unspecified: Secondary | ICD-10-CM | POA: Diagnosis not present

## 2018-11-14 DIAGNOSIS — G119 Hereditary ataxia, unspecified: Secondary | ICD-10-CM

## 2018-11-14 NOTE — Progress Notes (Signed)
Modified Barium Swallow Progress Note  Patient Details  Name: Kentoria Perry MRN: [id number] Date of Birth: October 15, 1950  Today's Date: 11/14/2018  Modified Barium Swallow completed.  Full report located under Chart Review in the Imaging Section.  Brief recommendations include the following:  Clinical Impression  Pt's oropharyngeal swallow is grossly functional with occasional penetration that occurs before the swallow with thin liquids. With cup sips it remains above the TVF but does not clear until prompted to clear her throat; with straw sips it clears spontaneously. Both are considered to be normal, particularly given her age. There was otherwise no overt reasion for her symptoms from an oropharyngeal standpoint. Of note, she did appear to have barium that remained in her esophagus after the swallow (barium tablet did clear), and given her other subjective symptoms, MD may wish to consider a more dedicated esophageal w/u if symptoms persist. Recommend continuing her baseline diet, which she reports to be soft solids and thin liquids.    Swallow Evaluation Recommendations       SLP Diet Recommendations: Dysphagia 3 (Mech soft) solids;Thin liquid   Liquid Administration via: Straw   Medication Administration: Whole meds with liquid   Supervision: Patient able to self feed   Compensations: Slow rate;Small sips/bites   Postural Changes: Seated upright at 90 degrees;Remain semi-upright after after feeds/meals (Comment)   Oral Care Recommendations: Oral care BID        Venita Sheffield Britley Gashi 11/14/2018,1:21 PM   Pollyann Glen, M.A. [place] Acute Environmental education officer [phone] Office [phone]

## 2018-11-15 ENCOUNTER — Ambulatory Visit (INDEPENDENT_AMBULATORY_CARE_PROVIDER_SITE_OTHER): Payer: Medicare Other | Admitting: Family Medicine

## 2018-11-15 ENCOUNTER — Encounter: Payer: Self-pay | Admitting: Family Medicine

## 2018-11-15 ENCOUNTER — Encounter: Payer: Self-pay | Admitting: Internal Medicine

## 2018-11-15 ENCOUNTER — Other Ambulatory Visit: Payer: Self-pay

## 2018-11-15 DIAGNOSIS — J4540 Moderate persistent asthma, uncomplicated: Secondary | ICD-10-CM

## 2018-11-15 DIAGNOSIS — F329 Major depressive disorder, single episode, unspecified: Secondary | ICD-10-CM

## 2018-11-15 DIAGNOSIS — Z23 Encounter for immunization: Secondary | ICD-10-CM | POA: Diagnosis not present

## 2018-11-15 DIAGNOSIS — E7800 Pure hypercholesterolemia, unspecified: Secondary | ICD-10-CM

## 2018-11-15 DIAGNOSIS — Z8041 Family history of malignant neoplasm of ovary: Secondary | ICD-10-CM

## 2018-11-15 DIAGNOSIS — E785 Hyperlipidemia, unspecified: Secondary | ICD-10-CM | POA: Insufficient documentation

## 2018-11-15 NOTE — Progress Notes (Signed)
Subjective  Bindu Butzer is a 68 y.o. female is presenting with the following  HYPERLIPIDEMIA Symptoms Chest pain on exertion:  no   Leg claudication:   no Medications (modifying factor): Compliance- daily lipitor Right upper quadrant pain- no  Muscle aches- no Duration - years   Timing - continuous    Component Value Date/Time   CHOL 155 11/15/2018 1253   TRIG 82 11/15/2018 1253   HDL 62 11/15/2018 1253   CHOLHDL 2.5 11/15/2018 1253    DEPRESSION Feels overall very well.  Usually happy and sleeps well.  Stable appetite.  Has been on dose of sertraline for years.  Is ok trying to wean down possilby to off  ASTHMA Rarely uses her rescue inhaler.  Uses flovent regularly.  No wheezing or shortness of breath with activities.  No shortness of breath or cough at night    Social History   Social History Narrative   Lives at home alone   Right handed     Chief Complaint noted Review of Symptoms - see HPI PMH - Smoking status noted.    Objective Vital Signs reviewed BP 123/70   Pulse 74   Wt 159 lb (72.1 kg)   SpO2 90%   BMI 22.81 kg/m   Uses wheel chair for mobility  Heart - Regular rate and rhythm.  No murmurs, gallops or rubs.    Lungs:  Normal respiratory effort, chest expands symmetrically. Lungs are clear to auscultation, no crackles or wheezes. Extremities:  No cyanosis, edema,  Psych:  Cognition and judgment appear intact. Alert, communicative  and cooperative with normal attention span and concentration. No apparent delusions, illusions, hallucinations   Assessments/Plans  See after visit summary for details of patient instuctions  Asthma Well controlled   Depression Very well controlled.  Will try weaning down sertraline   Hypercholesteremia Stable Check labs   FH: ovarian cancer in first degree relative In her sister and mother. Asked her to contact sister to find out from her oncologist if needs genetic work up

## 2018-11-15 NOTE — Patient Instructions (Addendum)
Good to see you today!  Thanks for coming in.  For Mood -try decreasing Zoloft to 50 mg 1/2 tab a day.  If you feel well continue if not then go back up to 100 mg a day  I will refer you to [facility] they will contact you about a colonoscopy  We will check a cholesterol test today and let you know the results  Have your sister ask her oncologist if there is a specific genetic test that you should be screened for   Stay safe

## 2018-11-16 DIAGNOSIS — Z8041 Family history of malignant neoplasm of ovary: Secondary | ICD-10-CM

## 2018-11-16 HISTORY — DX: Family history of malignant neoplasm of ovary: Z80.41

## 2018-11-16 LAB — LIPID PANEL
Chol/HDL Ratio: 2.5 ratio (ref 0.0–4.4)
Cholesterol, Total: 155 mg/dL (ref 100–199)
HDL: 62 mg/dL (ref >39)
LDL Chol Calc (NIH): 77 mg/dL (ref 0–99)
Triglycerides: 82 mg/dL (ref 0–149)
VLDL Cholesterol Cal: 16 mg/dL (ref 5–40)

## 2018-11-16 MED ORDER — INSULIN ASPART 100 UNIT/ML CARTRIDGE (PENFILL): SUBCUTANEOUS | 3 refills | Status: DC

## 2018-11-16 NOTE — Assessment & Plan Note (Signed)
>>  ASSESSMENT AND PLAN FOR ASTHMA WRITTEN ON 11/16/2018  9:29 AM BY Bartley Vuolo L, MD  Well controlled

## 2018-11-16 NOTE — Assessment & Plan Note (Signed)
Well controlled 

## 2018-11-16 NOTE — Assessment & Plan Note (Signed)
>>  ASSESSMENT AND PLAN FOR HYPERCHOLESTEREMIA WRITTEN ON 11/16/2018  9:30 AM BY Charmagne Buhl L, MD  Stable Check labs

## 2018-11-16 NOTE — Assessment & Plan Note (Signed)
Stable Check labs 

## 2018-11-16 NOTE — Assessment & Plan Note (Signed)
In her sister and mother. Asked her to contact sister to find out from her oncologist if needs genetic work up

## 2018-11-16 NOTE — Assessment & Plan Note (Signed)
Very well controlled.  Will try weaning down sertraline

## 2018-11-18 ENCOUNTER — Encounter: Payer: Self-pay | Admitting: Family Medicine

## 2018-11-19 NOTE — Progress Notes (Signed)
Subjective:  Joyce Brown presents to clinic today with cc of  painful, thick, discolored, elongated toenails 1-5 b/l that become tender and cannot cut because of thickness. Pain is aggravated when wearing enclosed shoe gear.  She voices no new pedal concerns on today's visit.  Lind Covert, MD is her PCP.  Last visit 11/15/2018.   Current Outpatient Medications:  .  albuterol (PROVENTIL HFA;VENTOLIN HFA) 108 (90 Base) MCG/ACT inhaler, Inhale 1 puff into the lungs 2 (two) times daily. (Patient not taking: Reported on 11/15/2018), Disp: 1 Inhaler, Rfl: 2 .  atorvastatin (LIPITOR) 40 MG tablet, Take 1 tablet (40 mg total) by mouth daily at 6 PM., Disp: 90 tablet, Rfl: 3 .  Calcium Citrate 250 MG TABS, Take 2 tablets (500 mg total) by mouth 2 (two) times daily., Disp: 120 tablet, Rfl: 11 .  Cholecalciferol (VITAMIN D3) 75 MCG (3000 UT) TABS, Take by mouth., Disp: , Rfl:  .  Continuous Blood Gluc Receiver ([place] Mel) DEVI, by Does not apply route., Disp: , Rfl:  .  Continuous Blood Gluc Sensor (DEXCOM G6 SENSOR) MISC, by Does not apply route., Disp: , Rfl:  .  fluticasone (FLOVENT HFA) 220 MCG/ACT inhaler, Inhale 1 puff into the lungs 2 (two) times daily., Disp: 3 Inhaler, Rfl: 4 .  ibuprofen (ADVIL,MOTRIN) 200 MG tablet, Take 400 mg by mouth 2 (two) times daily., Disp: , Rfl:  .  injection device for insulin (INPEN 100-PINK-NOVO) DEVI, As directed, Disp: 1 each, Rfl: 0 .  insulin aspart (NOVOLOG) cartridge, Inject 15 Units into the skin daily with breakfast AND 12 Units daily with lunch AND 12 Units daily with supper. Max daily dose of 60 units., Disp: 54 mL, Rfl: 3 .  insulin glargine (LANTUS) 100 unit/mL SOPN, Inject 0.1 mLs (10 Units total) into the skin daily. Inject 12 units daily DX E10.65, Disp: 12 mL, Rfl: 3 .  Insulin Pen Needle (B-D UF III MINI PEN NEEDLES) 31G X 5 MM MISC, Four times a day, Disp: 400 each, Rfl: 3 .  MICROLET LANCETS MISC, Twice a day, Disp: 100 each,  Rfl: 11 .  Misc. Devices MISC, by Does not apply route. C PAP, Disp: , Rfl:  .  Multiple Vitamin (MULTIVITAMIN) capsule, Take 4 capsules by mouth daily. Bariatric fusion , Disp: , Rfl:  .  mycophenolate (CELLCEPT) 500 MG tablet, TAKE TWO TABLETS BY MOUTH TWICE A DAY, Disp: 360 tablet, Rfl: 0 .  oxybutynin (DITROPAN) 5 MG tablet, TAKE ONE TABLET BY MOUTH TWICE A DAY, Disp: 180 tablet, Rfl: 1 .  sertraline (ZOLOFT) 100 MG tablet, TAKE ONE TABLET BY MOUTH DAILY, Disp: 90 tablet, Rfl: 1 .  tiZANidine (ZANAFLEX) 2 MG tablet, Take 2-4 mg by mouth at bedtime as needed for muscle spasms., Disp: , Rfl:    No Known Allergies   Objective: Physical Examination: unchanged from previous visit  Vascular Examination: Capillary refill time immediate x 10 digits.  Palpable DP/PT pulses b/l.  Digital hair absent b/l.  No edema noted b/l.  Skin temperature gradient WNL b/l.  Dermatological Examination: Skin with normal turgor, texture and tone b/l.  No open wounds b/l.  No interdigital macerations noted b/l.  Elongated, thick, discolored brittle toenails with subungual debris and pain on dorsal palpation of nailbeds 1-5 b/l.  Musculoskeletal Examination: Muscle strength 5/5 to all muscle groups b/l.  Tailor's bunion deformity b/l.  HAV with bunion b/l.  Hammertoes 2-5 b/l.  No pain, crepitus or joint discomfort with active/passive ROM.  Neurological Examination: Sensation intact 5/5 b/l with 10 gram monofilament.  Vibratory sensation intact b/l.  Proprioceptive sensation intact b/l.  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 Continue diabetic foot care principles daily.  2. Toenails 1-5 b/l were debrided in length and girth without iatrogenic laceration. 2.  Continue soft, supportive shoe gear daily. 3.   Report any pedal injuries to medical professional. 4.  Follow up 3 months. 5.  Patient/POA to call should there be a question/concern in there interim.

## 2018-11-21 ENCOUNTER — Encounter: Payer: Self-pay | Admitting: Family Medicine

## 2018-11-23 ENCOUNTER — Encounter: Payer: Self-pay | Admitting: Internal Medicine

## 2018-11-27 DIAGNOSIS — G2582 Stiff-man syndrome: Secondary | ICD-10-CM | POA: Diagnosis not present

## 2018-11-29 ENCOUNTER — Encounter: Payer: Self-pay | Admitting: Internal Medicine

## 2018-11-29 ENCOUNTER — Encounter: Payer: Self-pay | Admitting: Family Medicine

## 2018-12-20 ENCOUNTER — Encounter: Payer: Self-pay | Admitting: Family Medicine

## 2018-12-22 ENCOUNTER — Other Ambulatory Visit: Payer: Self-pay | Admitting: Family Medicine

## 2018-12-22 DIAGNOSIS — G119 Hereditary ataxia, unspecified: Secondary | ICD-10-CM | POA: Diagnosis not present

## 2018-12-22 DIAGNOSIS — Z9884 Bariatric surgery status: Secondary | ICD-10-CM | POA: Diagnosis not present

## 2018-12-22 DIAGNOSIS — K912 Postsurgical malabsorption, not elsewhere classified: Secondary | ICD-10-CM | POA: Diagnosis not present

## 2018-12-22 DIAGNOSIS — E109 Type 1 diabetes mellitus without complications: Secondary | ICD-10-CM | POA: Diagnosis not present

## 2018-12-22 DIAGNOSIS — E7800 Pure hypercholesterolemia, unspecified: Secondary | ICD-10-CM | POA: Diagnosis not present

## 2018-12-25 DIAGNOSIS — G2582 Stiff-man syndrome: Secondary | ICD-10-CM | POA: Diagnosis not present

## 2019-01-02 ENCOUNTER — Encounter: Payer: Self-pay | Admitting: Internal Medicine

## 2019-01-04 ENCOUNTER — Ambulatory Visit (INDEPENDENT_AMBULATORY_CARE_PROVIDER_SITE_OTHER): Payer: Medicare Other

## 2019-01-04 ENCOUNTER — Encounter: Payer: Self-pay | Admitting: Dietician

## 2019-01-04 ENCOUNTER — Other Ambulatory Visit: Payer: Self-pay

## 2019-01-04 ENCOUNTER — Encounter: Payer: Medicare Other | Attending: Internal Medicine | Admitting: Dietician

## 2019-01-04 DIAGNOSIS — E108 Type 1 diabetes mellitus with unspecified complications: Secondary | ICD-10-CM | POA: Insufficient documentation

## 2019-01-04 DIAGNOSIS — E1065 Type 1 diabetes mellitus with hyperglycemia: Secondary | ICD-10-CM

## 2019-01-04 DIAGNOSIS — IMO0002 Reserved for concepts with insufficient information to code with codable children: Secondary | ICD-10-CM

## 2019-01-04 LAB — POCT GLYCOSYLATED HEMOGLOBIN (HGB A1C): Hemoglobin A1C: 9.7 % — AB (ref 4.0–5.6)

## 2019-01-04 NOTE — Patient Instructions (Signed)
Continue the consistent carbohydrate diet. Continue to take your medication as prescribed Continue to wheel around your complex often each day

## 2019-01-04 NOTE — Progress Notes (Signed)
Diabetes Self-Management Education  Visit Type:  Follow-up  Appt. Start Time: 0910 Appt. End Time: 0940  01/04/2019  [name], identified by name and date of birth, is a 68 y.o. female with a diagnosis of Diabetes:  Type 1  .    ASSESSMENT: She has been taking 2 wheels around her apartment each day in her wheel chair.  She states that this helps with stress related to the election. She states that the more sleep she gets, the better her BG is. She has found greek yogurt that is lower in carbohydrates and an edamame pasta which is also lower in carbohydrates and much higher in protein than other pasta. She reports weight is stable at 163 lbs. She is seeind Dr. Redmond Pulling (one of the Bariatric surgeons) to maintain follow up related to history of bariatric surgery and he checks her vitamin status. She continues to use the Dexcom G-6 and brought a 2 week report today. Her fasting BG remains in the low 100's to the low 200's daily and BG has been >400 on most days. Her A1C of 9.7% today in reflective of higher BG over the past 3 months.  This has increased from 5.9% 08/17/22 but accuracy of this lab in June was questioned.  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  Type 1 diabetes since 2007, asthma, cerebellar ataxia, HTN, HLD, OSA- on C-pap and sleep study July 6.  She has had a Dexcom G-6 since 07/11/18.Inpen recently. A1C at point of care 09/2018 was 5.9% decreased from 10.3% February 2020.  Medications include: Novolog 14 units before Breakfast, 12 units beforelunch and 10units before dinner as well as sliding scale for hyperglycemia,Lantus 10 units q HS (8:30 pm).  Per 24 hour food recall, CHO with breakfast was 62 grams,  lunch 50 grams and dinner 41 grams.  Patient lives alone. She uses a wheel chair. She is doing her own grocery shopping. She uses [employer] [facility] for medical appointments but is able to wheel herself to the grocery store. 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 She moved here from [place] last year to be closer to one of her sister's who lives in [place]. The pool at her apartment is to reopen outdoors. Patient enjoys walking in the pool.  She last saw an RD in [place] about 11 months ago.   Weight 163 lb (73.9 kg). Body mass index is 23.39 kg/m.   Diabetes Self-Management Education - 01/04/19 1004      Psychosocial Assessment   Patient Belief/Attitude about Diabetes  Motivated to manage diabetes    Self-care barriers  Unsteady gait/risk for falls;Debilitated state due to current medical condition;Other (comment)   Concerns of memory   Self-management support  Doctor's office;CDE visits    Patient Concerns  Glycemic Control;Nutrition/Meal planning    Special Needs  None    Preferred Learning Style  No preference indicated    Learning Readiness  Ready      Pre-Education Assessment   Patient understands the diabetes disease and treatment process.  Demonstrates understanding / competency    Patient understands incorporating nutritional management into lifestyle.  Needs Review    Patient undertands incorporating physical activity into lifestyle.  Demonstrates understanding /  competency    Patient understands using medications safely.  Demonstrates understanding / competency    Patient understands monitoring blood glucose, interpreting and using results  Demonstrates understanding / competency    Patient understands prevention, detection, and treatment of acute complications.  Demonstrates understanding / competency    Patient understands prevention, detection, and treatment of chronic complications.  Demonstrates  understanding / competency    Patient understands how to develop strategies to address psychosocial issues.  Demonstrates understanding / competency    Patient understands how to develop strategies to promote health/change behavior.  Needs Review      Complications   Last HgB A1C per patient/outside source  9.7 %   01/04/19 increased from 5.9% 08/17/2018   How often do you check your blood sugar?  > 4 times/day    Fasting Blood glucose range (mg/dL)  70-129;130-179;180-200;>200    Postprandial Blood glucose range (mg/dL)  130-179;180-200;>200    Number of hypoglycemic episodes per month  0    Number of hyperglycemic episodes per week  21      Dietary Intake   Breakfast  1/2 cup fruit, [employer] Complete yogurt, sprouted multigrain thin bagel (62 grams carbs for meal)    Snack (morning)  none    Lunch  tuna melt with cheddar cheese on Naan OR Pizza made on Naan with rotissorie chicken AND medicum apple (50 grams carbs for meal)    Snack (afternoon)  none    Public librarian ([employer]), medium apple (41 grams carbs for the meal)    Snack (evening)  none    Beverage(s)  water, rare cranberry juice, rare unsweetened tea with almond milk      Exercise   Exercise Type  Light (walking / raking leaves)    How many days per week to you exercise?  7    How many minutes per day do you exercise?  30    Total minutes per week of exercise  210      Patient Education   Previous Diabetes Education  Yes (please comment)   8/20200   Nutrition management   Carbohydrate counting    Medications  Reviewed patients medication for diabetes, action, purpose, timing of dose and side effects.    Monitoring  Other (comment)   reviewed dexcom printout     Individualized Goals (developed by patient)   Nutrition  Follow meal plan discussed    Physical Activity  Exercise 5-7 days per week;30 minutes per day    Medications  take my medication as prescribed    Monitoring   test my blood glucose as  discussed    Health Coping  discuss diabetes with (comment)   MD, RD, CDE     Patient Self-Evaluation of Goals - Patient rates self as meeting previously set goals (% of time)   Nutrition  >75%    Physical Activity  >75%    Medications  >75%    Monitoring  >75%    Problem Solving  >75%    Reducing Risk  >75%    Health Coping  >75%      Post-Education Assessment   Patient understands the diabetes disease and treatment process.  Demonstrates understanding / competency    Patient understands incorporating nutritional management into lifestyle.  Needs Review    Patient undertands incorporating physical activity into lifestyle.  Demonstrates understanding / competency    Patient understands using medications safely.  Needs Review    Patient understands monitoring blood  glucose, interpreting and using results  Demonstrates understanding / competency    Patient understands prevention, detection, and treatment of acute complications.  Demonstrates understanding / competency    Patient understands prevention, detection, and treatment of chronic complications.  Demonstrates understanding / competency    Patient understands how to develop strategies to address psychosocial issues.  Demonstrates understanding / competency    Patient understands how to develop strategies to promote health/change behavior.  Needs Review      Outcomes   Program Status  Completed      Subsequent Visit   Since your last visit have you continued or begun to take your medications as prescribed?  Yes    Since your last visit have you experienced any weight changes?  No change       Learning Objective:  Patient will have a greater understanding of diabetes self-management. Patient education plan is to attend individual and/or group sessions per assessed needs and concerns.   Plan:   Patient Instructions  Continue the consistent carbohydrate diet. Continue to take your medication as prescribed Continue to wheel  around your complex often each day    Expected Outcomes:  Demonstrated interest in learning. Expect positive outcomes  Education material provided:   If problems or questions, patient to contact team via:  Phone and Email  Future DSME appointment: - 3-4 months

## 2019-01-04 NOTE — Progress Notes (Signed)
Pt presents today for nurse visit for completion of A1C. Results sent to on call provider, Dr. Dwyane Dee to review and address.

## 2019-01-05 ENCOUNTER — Telehealth: Payer: Self-pay | Admitting: Dietician

## 2019-01-05 NOTE — Telephone Encounter (Signed)
Patient seen by me 10/22 with increased BG readings >400 and A1C of 9.7%.  She denies missing insulin doses although this remains a concern.  Carbohydrate intake is greatest in the am (62 grams with breakfast, 50 grams with lunch, and 42 grams with dinner per 24 hour recall.   Reviewed her inpen report/Dexcom G-6 with Dr. Cruzita Lederer. Verbal instruction received to increase Intake of the Novolog to 18 units before breakfast and if this was not sufficient to reduce BG then to change Lantus to am.  Called patient and instructed her on the above insulin changes and she was able to teach back to me.  Discussed that if she does need to change the timing of the Lantus to do so by taking this in the afternoon the first day and changing this to the morning on the second day.    Instructed her to call for any questions.  Antonieta Iba, RD, LDN, CDE

## 2019-01-07 ENCOUNTER — Other Ambulatory Visit: Payer: Self-pay | Admitting: Neurology

## 2019-01-09 ENCOUNTER — Telehealth: Payer: Self-pay | Admitting: Dietician

## 2019-01-09 NOTE — Telephone Encounter (Signed)
Called patient to check on her BG post insulin adjustment on Friday and e-mail that she sent today. Saturday am, she increased her Novolog to 20 units 15 minutes before breakfast. She reports BG were improved for the next 24 hours and since have been elevated again.  Her fasting BG this am was 326.  Has taken  The Lantus at 11:30 today and is moving this to a morning schedule starting Wednesday am.  Her BG was 86 at 2 pm when I called her today. Lunch was 45-60  grams of carbs (she is very consistent with this) and included a good source of protein.  Instructed her to eat a snack with 15 grams of carbohydrates and a protein.  She is to call for any questions or concerns. E-mail below was reviewed.  Antonieta Iba, RD, LDN, CDE   E-mail received from [place] this am. "Following our conversation on Friday, I increased my morning pre-meal Novolog to 20 units. The initial results were hopeful (for the first 24 hours or so) and I was hopeful. Sunday into Monday threw a wrench at those hopes though - I had very high readings (lowest  reading was 273 on Sunday at dinner with readings of 410 - 415 on Monday. This morning's reading was 330, so I'm going to try switching the time I take the Lantus to mid-day today and then tomorrow morning.  I'll let you know how it goes:

## 2019-01-10 ENCOUNTER — Encounter: Payer: Self-pay | Admitting: Family Medicine

## 2019-01-11 ENCOUNTER — Telehealth: Payer: Self-pay | Admitting: Dietician

## 2019-01-11 DIAGNOSIS — K912 Postsurgical malabsorption, not elsewhere classified: Secondary | ICD-10-CM | POA: Diagnosis not present

## 2019-01-11 NOTE — Telephone Encounter (Signed)
Called patient to check regarding her insulin changes.  She reports that she has had no BG less than 70 since changing her Lantus to the am and increasing Novolog before breakfast.   Fasting BG was 386 this am but states overall that BG has been better. She has an appointment with Dr. Kelton Pillar 01/17/19 and will address this with her.  She is to call for any questions.  Joyce Brown, RD, LDN, CDE

## 2019-01-15 ENCOUNTER — Other Ambulatory Visit: Payer: Self-pay

## 2019-01-17 ENCOUNTER — Encounter: Payer: Self-pay | Admitting: Internal Medicine

## 2019-01-17 ENCOUNTER — Other Ambulatory Visit: Payer: Self-pay

## 2019-01-17 ENCOUNTER — Ambulatory Visit (INDEPENDENT_AMBULATORY_CARE_PROVIDER_SITE_OTHER): Payer: Medicare Other | Admitting: Internal Medicine

## 2019-01-17 VITALS — BP 118/64 | HR 92 | Ht 70.0 in | Wt 160.2 lb

## 2019-01-17 DIAGNOSIS — E1065 Type 1 diabetes mellitus with hyperglycemia: Secondary | ICD-10-CM | POA: Diagnosis not present

## 2019-01-17 LAB — LIPID PANEL
Cholesterol: 183 mg/dL (ref 0–200)
HDL: 63.5 mg/dL (ref >39.00)
LDL Cholesterol: 102 mg/dL — ABNORMAL HIGH (ref 0–99)
NonHDL: 119.3
Total CHOL/HDL Ratio: 3
Triglycerides: 86 mg/dL (ref 0.0–149.0)
VLDL: 17.2 mg/dL (ref 0.0–40.0)

## 2019-01-17 LAB — TSH: TSH: 0.82 u[IU]/mL (ref 0.35–4.50)

## 2019-01-17 MED ORDER — LANTUS SOLOSTAR 100 UNIT/ML ~~LOC~~ SOPN: 11.0000 [IU] | PEN_INJECTOR | Freq: Every day | SUBCUTANEOUS | 3 refills | Status: DC

## 2019-01-17 MED ORDER — INSULIN GLARGINE 100 UNITS/ML SOLOSTAR PEN: 11.0000 [IU] | PEN_INJECTOR | Freq: Every day | SUBCUTANEOUS | 3 refills | Status: DC

## 2019-01-17 NOTE — Patient Instructions (Signed)
Increase Lantus to 11 units daily  Novolog 18 units with Breakfast , 12 units with Lunch and supper     HOW TO TREAT LOW BLOOD SUGARS (Blood sugar LESS THAN 70 MG/DL)  Please follow the RULE OF 15 for the treatment of hypoglycemia treatment (when your (blood sugars are less than 70 mg/dL)    STEP 1: Take 15 grams of carbohydrates when your blood sugar is low, which includes:   3-4 GLUCOSE TABS  OR  3-4 OZ OF JUICE OR REGULAR SODA OR  ONE TUBE OF GLUCOSE GEL     STEP 2: RECHECK blood sugar in 15 MINUTES STEP 3: If your blood sugar is still low at the 15 minute recheck --> then, go back to STEP 1 and treat AGAIN with another 15 grams of carbohydrates.

## 2019-01-17 NOTE — Progress Notes (Signed)
Name: Joyce Brown  Age/ Sex: 68 y.o., female   MRN/ DOB: [id number], 1950-05-26     PCP: Lind Covert, MD   Reason for Endocrinology Evaluation: Type 1 Diabetes Mellitus  Initial Endocrine Consultative Visit: 01/17/2018    PATIENT IDENTIFIER: Joyce Brown is a 68 y.o. female with a past medical history of T1DM, cerebellar atrophy, asthma, OSA on CPAP and Hx of gastric bypass in 2014  . The patient has followed with Endocrinology clinic since 01/17/18 for consultative assistance with management of her diabetes.  Patient moved from [place] in September, 2019 to be close to her sister. She has been diagnosed with stiff person Syndrome but Dr. Jannifer Franklin thought this may be more of cerebellar atrophy. She was on  IVIG until 11/07/2018. 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 She has only had one DKA episode which was in 11/2017. Marland Kitchen Her hemoglobin A1c has ranged from 8.4% in 01/2017, peaking at 12.1 % in 01/2016  She is followed by Dr. 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 She has been off IVIG since 02/2018. He has been tapering her diazepam down and is currently on cellcept.    Started using the inpen 07/2018  SUBJECTIVE:   During the last visit (05/04/2018): Continued lantus and prandial insulin    Today (01/17/2019): Joyce Brown is here for a 3 months follow up visit on diabetes.  She checks her blood sugars multiple times daily, using  CGM (Dexcom). The patient has had hypoglycemic episodes since the last clinic visit, which typically occur 1 x / week- most often occuring during the day . The patient is symptomatic with these episodes. Otherwise, the patient has not required any recent emergency interventions for hypoglycemia and hasnot had recent hospitalizations secondary to hyper or  hypoglycemic episodes.   She has been using the in-pen and likes it a lot     ROS: As per HPI and as detailed below: Review of Systems  Respiratory: Negative for cough and shortness of breath.   Cardiovascular: Negative for chest pain and palpitations.  Gastrointestinal: Negative for diarrhea and nausea.  Endo/Heme/Allergies: Negative for polydipsia.      HOME DIABETES REGIMEN:  Lantus 10 units QHS Novolog 18 units  With Breakfast, 12 units with lunch and 12 units with supper  CF (BG - 110/45)    CONTINUOUS GLUCOSE MONITORING RECORD INTERPRETATION    Dates of Recording:10/22-11/06/2018 Sensor description:  Results statistics:   CGM use % of time 93  Average and SD 319/97  Time in range   14  %  % Time Above 180 10  % Time above 250 75  % Time Below target <1    Glycemic patterns summary: Hyperglycemia during the day and sometimes during the night, no specific pattern  Hyperglycemic episodes  All day and sometimes all night   Hypoglycemic episodes occurred postprandial  Overnight periods: high  IN-Pen report 8/5-11/04/2018  Average 275 Missed doses 97 (84 long acting/4 breakfast, 4 lunch and 4 supper) SD 100   HISTORY:  Past Medical History:  Past Medical History:  Diagnosis Date  . Asthma   . Cerebellar ataxia ([place])   . Diabetes mellitus without complication ([place])   . Gait abnormality 11/07/2018  . Glaucoma   . Hyperlipidemia   . Hypertension   . Pulmonary embolism ([place])   .  Sleep apnea   . Spinal stenosis    Past Surgical History:  Past Surgical History:  Procedure Laterality Date  . ABDOMINAL HYSTERECTOMY    . ADENOIDECTOMY    . ANKLE SURGERY Left   . BILATERAL CARPAL TUNNEL RELEASE    . ELBOW SURGERY    . GASTRIC BYPASS  2014  . HAND NEUROPLASTY    . TONSILLECTOMY      Social History:  reports that she has never smoked. She has never used smokeless tobacco. She reports that she does not drink alcohol or use drugs. Family History:   Family History  Problem Relation Age of Onset  . Cervical cancer Mother   . Ovarian cancer Mother   . Dementia Father   . Retinal detachment Father   . Heart attack Father   . Diabetes Father   . Stroke Father   . Hypertension Father   . Glaucoma Paternal Grandfather   . Ovarian cancer Sister      HOME MEDICATIONS: Allergies as of 01/17/2019   No Known Allergies     Medication List       Accurate as of January 17, 2019  9:47 AM. If you have any questions, ask your nurse or doctor.        STOP taking these medications   albuterol 108 (90 Base) MCG/ACT inhaler Commonly known as: VENTOLIN HFA Stopped by: Dorita Sciara, MD   ibuprofen 200 MG tablet Commonly known as: ADVIL Stopped by: Dorita Sciara, MD   Misc. Devices Misc Stopped by: Dorita Sciara, MD     TAKE these medications   acetaminophen 325 MG tablet Commonly known as: TYLENOL Take 650 mg by mouth every 6 (six) hours as needed.   atorvastatin 40 MG tablet Commonly known as: LIPITOR TAKE ONE TABLET BY MOUTH DAILY AT 6 PM   B-D UF III MINI PEN NEEDLES 31G X 5 MM Misc Generic drug: Insulin Pen Needle Four times a day   Calcium Citrate 250 MG Tabs Take 2 tablets (500 mg total) by mouth 2 (two) times daily.   Dexcom G6 Receiver Devi by Does not apply route.   Dexcom G6 Sensor Misc by Does not apply route.   fluticasone 220 MCG/ACT inhaler Commonly known as: FLOVENT HFA Inhale 1 puff into the lungs 2 (two) times daily.   glucose blood test strip 1 each by Other route daily. ASCENSIA CONTOUR NEXT ONE: Use to check blood sugar   injection device for insulin Devi Commonly known as: InPen 100-Pink-Novo As directed   insulin aspart cartridge Commonly known as: NOVOLOG Inject 15 Units into the skin daily with breakfast AND 12 Units daily with lunch AND 12 Units daily with supper. Max daily dose of 60 units.   insulin glargine 100 unit/mL Sopn Commonly known as: LANTUS Inject  0.1 mLs (10 Units total) into the skin daily. Inject 12 units daily DX E10.65   Microlet Lancets Misc Twice a day   multivitamin capsule Take 4 capsules by mouth daily. Bariatric fusion   mycophenolate 500 MG tablet Commonly known as: CELLCEPT TAKE TWO TABLETS BY MOUTH TWICE A DAY   oxybutynin 5 MG tablet Commonly known as: DITROPAN TAKE ONE TABLET BY MOUTH TWICE A DAY   sertraline 100 MG tablet Commonly known as: ZOLOFT TAKE ONE TABLET BY MOUTH DAILY   tiZANidine 2 MG tablet Commonly known as: ZANAFLEX Take 2-4 mg by mouth at bedtime as needed for muscle spasms.   Vitamin D3 75 MCG (3000  UT) Tabs Take by mouth.       PHYSICAL EXAM: VS: BP 118/64 (BP Location: Right Arm, Patient Position: Sitting, Cuff Size: Large)   Pulse 92   Ht 5\' 10"  (1.778 m)   Wt 160 lb 4 oz (72.7 kg)   SpO2 98%   BMI 22.99 kg/m    EXAM: General: Pt appears well and is in NAD  Lungs: Clear with good BS bilat with no rales, rhonchi, or wheezes  Heart: Auscultation: RRR with normal S1 and S2, no gallops or murmurs Carotid arteries: no bruits Periph. circulation: no peripheral edema  Abdomen: Normoactive bowel sounds, soft, nontender, without masses or organomegaly palpable  Extremities:  BL LE: no pretibial edema normal ROM and strength, no joint enlargement or tenderness  Mental Status: Judgment, insight: intact Mood and affect: no depression, anxiety, or agitation   DM Foot Exam 10/17/18 The skin of the feet is intact without sores or ulcerations. The pedal pulses are 2+ on right and 2+ on left. The sensation is intact to a screening 5.07, 10 gram monofilament bilaterally   DATA REVIEWED:  Lab Results  Component Value Date   HGBA1C 9.7 (A) 01/04/2019   HGBA1C 5.9 (A) 08/17/2018   HGBA1C 10.3 (A) 05/04/2018   Lab Results  Component Value Date   MICROALBUR 2.3 (H) 02/07/2018   LDLCALC 77 11/15/2018   CREATININE 0.98 11/07/2018       ASSESSMENT / PLAN / RECOMMENDATIONS:   1)  Type 1 Diabetes Mellitus, Poorly controlled, With Hx of hypoglycemia unawareness - Most recent A1c of 9.7  %. Goal A1c < 7.5 %.   Plan:  - Pt continues with hyperglycemia, there's no specific pattern to her hyper  Or hypoglycemia - She is insulin sensitive which is not unusual with type 1 diabetes.  - In review of her in-pen report it appears that she misses some doses and that at times when she takes a novolog dose her sugar does not change AT ALL but later in the day she will have a hypoglycemic episode.  - I am unclear of why she would not respond at all to prandial insulin, and at times she will develop hypoglycemia , I am going to order insulin antibodies on her.  - We also teased the idea of an insulin pump and she is not opposed to this.   MEDICATIONS:  Increase Lantus to 11 units daily    Continue Novolog 18 units with breakfast and  Continue lunch 12 units , and 12 units with  supper   Sensitivity factor  50     EDUCATION / INSTRUCTIONS:  BG monitoring instructions: Patient is instructed to check her blood sugars 4 times a day with CGM caliberation.  Call [facility] if: BG persistently < 70 or > 300. . I reviewed the Rule of 15 for the treatment of hypoglycemia in detail with the patient.     2) Diabetic complications:   Eye: Does not have known diabetic retinopathy.   Neuro/ Feet: Does not have known diabetic peripheral neuropathy.  Renal: Patient does not have known baseline CKD. She is not on an ACEI/ARB at present.       F/U in 3 months    Signed electronically by: Mack Guise, MD  [facility]  [address] [place], [place] [address] Phone: [phone] FAX: [phone]   CC: Lind Covert, [address] [place] [address] Phone: [phone]  Fax:  [phone]  Return to Endocrinology clinic as below: Future Appointments  Date Time Provider  [place]  01/24/2019 10:00 AM Marzetta Board, DPM [facility]  03/22/2019  9:00 AM Valora Piccolo Barnabas Lister, RD [facility]  05/14/2019 12:00 PM Kathrynn Ducking, MD [facility] None

## 2019-01-18 ENCOUNTER — Encounter: Payer: Self-pay | Admitting: Internal Medicine

## 2019-01-19 ENCOUNTER — Telehealth: Payer: Self-pay | Admitting: Dietician

## 2019-01-19 NOTE — Telephone Encounter (Signed)
Brief Nutrition note:  Called and spoke with patient today to inquire further about cause of increased BG.  She states that she gets her insulin from the pharmacy and is sure that it is stored in an appropriate place.  She is working on rotation of insulin injection sites.  She will bring a log of her intake at her next visit along with the dexcom CGM report. She states that she is working on decreasing stress and notices that politics causes increased stress.  She states that she has a mindfulness app that is helping to decrease her stress.  She wakes up often with racing thoughts.  Discussed the role of stress on her BG.    Antonieta Iba, RD, LDN, CDE

## 2019-01-22 DIAGNOSIS — G2582 Stiff-man syndrome: Secondary | ICD-10-CM | POA: Diagnosis not present

## 2019-01-23 ENCOUNTER — Encounter: Payer: Self-pay | Admitting: Internal Medicine

## 2019-01-23 ENCOUNTER — Encounter: Payer: Self-pay | Admitting: Family Medicine

## 2019-01-24 ENCOUNTER — Ambulatory Visit (INDEPENDENT_AMBULATORY_CARE_PROVIDER_SITE_OTHER): Payer: Medicare Other | Admitting: Podiatry

## 2019-01-24 ENCOUNTER — Encounter: Payer: Self-pay | Admitting: Podiatry

## 2019-01-24 ENCOUNTER — Other Ambulatory Visit: Payer: Self-pay

## 2019-01-24 DIAGNOSIS — E1065 Type 1 diabetes mellitus with hyperglycemia: Secondary | ICD-10-CM | POA: Diagnosis not present

## 2019-01-24 DIAGNOSIS — B351 Tinea unguium: Secondary | ICD-10-CM

## 2019-01-24 DIAGNOSIS — M79675 Pain in left toe(s): Secondary | ICD-10-CM | POA: Diagnosis not present

## 2019-01-24 DIAGNOSIS — M79674 Pain in right toe(s): Secondary | ICD-10-CM | POA: Diagnosis not present

## 2019-01-24 LAB — INSULIN ANTIBODIES, BLOOD: Insulin Antibodies, Human: <0.4 U/mL (ref <0.4)

## 2019-01-24 NOTE — Patient Instructions (Signed)
Diabetes Mellitus and Foot Care Foot care is an important part of your health, especially when you have diabetes. Diabetes may cause you to have problems because of poor blood flow (circulation) to your feet and legs, which can cause your skin to:  Become thinner and drier.  Break more easily.  Heal more slowly.  Peel and crack. You may also have nerve damage (neuropathy) in your legs and feet, causing decreased feeling in them. This means that you may not notice minor injuries to your feet that could lead to more serious problems. Noticing and addressing any potential problems early is the best way to prevent future foot problems. How to care for your feet Foot hygiene  Wash your feet daily with warm water and mild soap. Do not use hot water. Then, pat your feet and the areas between your toes until they are completely dry. Do not soak your feet as this can dry your skin.  Trim your toenails straight across. Do not dig under them or around the cuticle. File the edges of your nails with an emery board or nail file.  Apply a moisturizing lotion or petroleum jelly to the skin on your feet and to dry, brittle toenails. Use lotion that does not contain alcohol and is unscented. Do not apply lotion between your toes. Shoes and socks  Wear clean socks or stockings every day. Make sure they are not too tight. Do not wear knee-high stockings since they may decrease blood flow to your legs.  Wear shoes that fit properly and have enough cushioning. Always look in your shoes before you put them on to be sure there are no objects inside.  To break in new shoes, wear them for just a few hours a day. This prevents injuries on your feet. Wounds, scrapes, corns, and calluses  Check your feet daily for blisters, cuts, bruises, sores, and redness. If you cannot see the bottom of your feet, use a mirror or ask someone for help.  Do not cut corns or calluses or try to remove them with medicine.  If you  find a minor scrape, cut, or break in the skin on your feet, keep it and the skin around it clean and dry. You may clean these areas with mild soap and water. Do not clean the area with peroxide, alcohol, or iodine.  If you have a wound, scrape, corn, or callus on your foot, look at it several times a day to make sure it is healing and not infected. Check for: ? Redness, swelling, or pain. ? Fluid or blood. ? Warmth. ? Pus or a bad smell. General instructions  Do not cross your legs. This may decrease blood flow to your feet.  Do not use heating pads or hot water bottles on your feet. They may burn your skin. If you have lost feeling in your feet or legs, you may not know this is happening until it is too late.  Protect your feet from hot and cold by wearing shoes, such as at the beach or on hot pavement.  Schedule a complete foot exam at least once a year (annually) or more often if you have foot problems. If you have foot problems, report any cuts, sores, or bruises to your health care provider immediately. Contact a health care provider if:  You have a medical condition that increases your risk of infection and you have any cuts, sores, or bruises on your feet.  You have an injury that is not   healing.  You have redness on your legs or feet.  You feel burning or tingling in your legs or feet.  You have pain or cramps in your legs and feet.  Your legs or feet are numb.  Your feet always feel cold.  You have pain around a toenail. Get help right away if:  You have a wound, scrape, corn, or callus on your foot and: ? You have pain, swelling, or redness that gets worse. ? You have fluid or blood coming from the wound, scrape, corn, or callus. ? Your wound, scrape, corn, or callus feels warm to the touch. ? You have pus or a bad smell coming from the wound, scrape, corn, or callus. ? You have a fever. ? You have a red line going up your leg. Summary  Check your feet every day  for cuts, sores, red spots, swelling, and blisters.  Moisturize feet and legs daily.  Wear shoes that fit properly and have enough cushioning.  If you have foot problems, report any cuts, sores, or bruises to your health care provider immediately.  Schedule a complete foot exam at least once a year (annually) or more often if you have foot problems. This information is not intended to replace advice given to you by your health care provider. Make sure you discuss any questions you have with your health care provider. Document Released: 02/27/2000 Document Revised: 04/13/2017 Document Reviewed: 04/02/2016 Elsevier Patient Education  2020 Elsevier Inc.  

## 2019-01-25 ENCOUNTER — Telehealth: Payer: Self-pay | Admitting: Internal Medicine

## 2019-01-25 NOTE — Telephone Encounter (Signed)
Reviewed inpen download over the phone with pt.     She has been using abdomen, apparently better absorption there.   She has been having post-breakfast hypoglycemia, will reduce NOvolog dose to 15 units , continue 12 units with lunch and supper   Insulin Ab negative     Abby Nena Jordan, MD  [facility]  [facility] [address] [place], [place] [address] Phone: [phone] FAX: [phone]

## 2019-01-26 ENCOUNTER — Encounter: Payer: Self-pay | Admitting: Internal Medicine

## 2019-01-30 ENCOUNTER — Encounter: Payer: Self-pay | Admitting: Internal Medicine

## 2019-01-30 ENCOUNTER — Other Ambulatory Visit: Payer: Self-pay | Admitting: Internal Medicine

## 2019-01-30 MED ORDER — INSULIN ASPART 100 UNIT/ML CARTRIDGE (PENFILL): SUBCUTANEOUS | 3 refills | Status: DC

## 2019-01-31 NOTE — Progress Notes (Signed)
Subjective: Joyce Brown presents for preventative diabetic foot care on today with painful, thick toenails 1-5 b/l that she cannot cut and which interfere with daily activities.  Pain is aggravated when wearing enclosed shoe gear.  She voices no new pedal concerns on today's visit.  Lind Covert, MD is her PCP. Also followed by Dr. Collier Flowers at [facility]. Last visit was 01/17/2019.  No Known Allergies  Objective: There were no vitals filed for this visit.  Vascular Examination: Capillary refill time immediate x 10 digits.  Dorsalis pedis and Posterior tibial pulses palpable b/l.  Digital hair absent b/l.  Skin temperature gradient WNL b/l.  Dermatological Examination: Skin with normal turgor, texture and tone b/l.  No open wounds b/l.  Toenails 1-5 b/l discolored, thick, dystrophic with subungual debris and pain with palpation to nailbeds due to thickness of nails.  Musculoskeletal: Muscle strength 5/5 to all LE muscle groups b/l.  Bunion deformity b/l. Hammertoes lesser digits b/l.  No pain, crepitus or joint limitation noted with ROM.   Neurological: Sensation intact 5/5 b/l with 10 gram monofilament.  Vibratory sensation intact.  Assessment: Painful onychomycosis toenails 1-5 b/l  IDDM  Plan: 1. No new findings. No new orders on today's visit. 2. Toenails 1-5 b/l were debrided in length and girth without iatrogenic bleeding. 3. Patient to continue soft, supportive shoe gear daily. 4. Patient to report any pedal injuries to medical professional immediately. 5. Follow up 9 weeks.  6. Patient/POA to call should there be a concern in the interim.

## 2019-02-06 ENCOUNTER — Emergency Department (HOSPITAL_COMMUNITY): Payer: Medicare Other

## 2019-02-06 ENCOUNTER — Encounter: Payer: Self-pay | Admitting: Internal Medicine

## 2019-02-06 ENCOUNTER — Emergency Department (HOSPITAL_COMMUNITY)
Admission: EM | Admit: 2019-02-06 | Discharge: 2019-02-06 | Disposition: A | Discharge status: Home / Self Care | Payer: Medicare Other | Attending: Emergency Medicine | Admitting: Emergency Medicine

## 2019-02-06 ENCOUNTER — Other Ambulatory Visit: Payer: Self-pay

## 2019-02-06 ENCOUNTER — Telehealth (INDEPENDENT_AMBULATORY_CARE_PROVIDER_SITE_OTHER): Payer: Medicare Other | Admitting: Family Medicine

## 2019-02-06 ENCOUNTER — Telehealth: Payer: Self-pay | Admitting: *Deleted

## 2019-02-06 DIAGNOSIS — R1031 Right lower quadrant pain: Secondary | ICD-10-CM

## 2019-02-06 DIAGNOSIS — Z86711 Personal history of pulmonary embolism: Secondary | ICD-10-CM | POA: Diagnosis not present

## 2019-02-06 DIAGNOSIS — E119 Type 2 diabetes mellitus without complications: Secondary | ICD-10-CM | POA: Diagnosis not present

## 2019-02-06 DIAGNOSIS — K5900 Constipation, unspecified: Secondary | ICD-10-CM | POA: Insufficient documentation

## 2019-02-06 DIAGNOSIS — Z79899 Other long term (current) drug therapy: Secondary | ICD-10-CM | POA: Insufficient documentation

## 2019-02-06 DIAGNOSIS — Z9884 Bariatric surgery status: Secondary | ICD-10-CM | POA: Diagnosis not present

## 2019-02-06 DIAGNOSIS — R110 Nausea: Secondary | ICD-10-CM | POA: Diagnosis not present

## 2019-02-06 DIAGNOSIS — I10 Essential (primary) hypertension: Secondary | ICD-10-CM | POA: Diagnosis not present

## 2019-02-06 DIAGNOSIS — Z794 Long term (current) use of insulin: Secondary | ICD-10-CM | POA: Diagnosis not present

## 2019-02-06 DIAGNOSIS — R1030 Lower abdominal pain, unspecified: Secondary | ICD-10-CM | POA: Diagnosis not present

## 2019-02-06 DIAGNOSIS — E161 Other hypoglycemia: Secondary | ICD-10-CM | POA: Diagnosis not present

## 2019-02-06 DIAGNOSIS — E162 Hypoglycemia, unspecified: Secondary | ICD-10-CM | POA: Diagnosis not present

## 2019-02-06 DIAGNOSIS — R1084 Generalized abdominal pain: Secondary | ICD-10-CM | POA: Diagnosis not present

## 2019-02-06 LAB — CBC
HCT: 41.8 % (ref 36.0–46.0)
Hemoglobin: 13.2 g/dL (ref 12.0–15.0)
MCH: 29.5 pg (ref 26.0–34.0)
MCHC: 31.6 g/dL (ref 30.0–36.0)
MCV: 93.5 fL (ref 80.0–100.0)
Platelets: 279 10*3/uL (ref 150–400)
RBC: 4.47 MIL/uL (ref 3.87–5.11)
RDW: 13.9 % (ref 11.5–15.5)
WBC: 5.9 10*3/uL (ref 4.0–10.5)
nRBC: 0 % (ref 0.0–0.2)

## 2019-02-06 LAB — COMPREHENSIVE METABOLIC PANEL
ALT: 40 U/L (ref 0–44)
AST: 34 U/L (ref 15–41)
Albumin: 4 g/dL (ref 3.5–5.0)
Alkaline Phosphatase: 49 U/L (ref 38–126)
Anion gap: 13 (ref 5–15)
BUN: 22 mg/dL (ref 8–23)
CO2: 24 mmol/L (ref 22–32)
Calcium: 10 mg/dL (ref 8.9–10.3)
Chloride: 101 mmol/L (ref 98–111)
Creatinine, Ser: 0.9 mg/dL (ref 0.44–1.00)
GFR calc Af Amer: >60 mL/min (ref >60)
GFR calc non Af Amer: >60 mL/min (ref >60)
Glucose, Bld: 163 mg/dL — ABNORMAL HIGH (ref 70–99)
Potassium: 3.7 mmol/L (ref 3.5–5.1)
Sodium: 138 mmol/L (ref 135–145)
Total Bilirubin: 0.7 mg/dL (ref 0.3–1.2)
Total Protein: 6.6 g/dL (ref 6.5–8.1)

## 2019-02-06 LAB — URINALYSIS, ROUTINE W REFLEX MICROSCOPIC
Bilirubin Urine: NEGATIVE
Glucose, UA: NEGATIVE mg/dL
Hgb urine dipstick: NEGATIVE
Ketones, ur: 5 mg/dL — AB
Leukocytes,Ua: NEGATIVE
Nitrite: NEGATIVE
Protein, ur: NEGATIVE mg/dL
Specific Gravity, Urine: >1.046 — ABNORMAL HIGH (ref 1.005–1.030)
pH: 6 (ref 5.0–8.0)

## 2019-02-06 LAB — CBG MONITORING, ED
Glucose-Capillary: 125 mg/dL — ABNORMAL HIGH (ref 70–99)
Glucose-Capillary: 112 mg/dL — ABNORMAL HIGH (ref 70–99)

## 2019-02-06 LAB — LIPASE, BLOOD: Lipase: 26 U/L (ref 11–51)

## 2019-02-06 MED ORDER — IOHEXOL 300 MG/ML  SOLN
100.0000 mL | Freq: Once | INTRAMUSCULAR | Status: AC | PRN
  Administered 2019-02-06: 100 mL via INTRAVENOUS

## 2019-02-06 MED ORDER — SODIUM CHLORIDE 0.9 % IV SOLN
Freq: Once | INTRAVENOUS | Status: AC
  Administered 2019-02-06: 19:00:00 via INTRAVENOUS

## 2019-02-06 MED ORDER — SODIUM CHLORIDE 0.9% FLUSH
3.0000 mL | Freq: Once | INTRAVENOUS | Status: AC
  Administered 2019-02-06: 3 mL via INTRAVENOUS

## 2019-02-06 MED ORDER — ONDANSETRON HCL 4 MG PO TABS: 4.0000 mg | ORAL_TABLET | Freq: Once | ORAL | Status: DC | PRN

## 2019-02-06 NOTE — ED Provider Notes (Signed)
[facility] EMERGENCY DEPARTMENT Provider Note   CSN: [id number] Arrival date & time: 02/06/19  1533     History   Chief Complaint Chief Complaint  Patient presents with  . Abdominal Pain    HPI Jeff Chessor is a 68 y.o. female with a pertinent PMH of T!DM, Fibromyalgia, Asthma, HLD, Hx of gastric bypass, who presents to [facility] ED with with RLQ abdominal pain that started started acutely early yesterday morning that progressed to diffuse lower abdominal pain with associated nausea. She denies fever, sick contacts, blood in stool, dysuria/hematuria. no recent travel, no residual problems from gastric bypass, no bloating, belching. Admits to intermittent headaches, nausea (w/o emesis), and decreased appetite only taking in water and boost (last solid PO intake was Sunday afternoon), constipation over the last couple days with at least 2-3 BM that were small volume and hard pellets. This is unusual for her as she normally has a well formed BM movement daily. She has not taken anything for this. She has had low blood sugars as a result not eating since Sunday. Currently, she has 5/10 pain that is improved with lying down and worsened with movement. She denies having similar symptoms in the past. Not currently nauseated.   SocHx. - denies hx of ETOH, Illicit drug use, tobacco, chronic NSAID use, does have a hx of gastric bypass.   HPI  Past Medical History:  Diagnosis Date  . Asthma   . Cerebellar ataxia ([place])   . Diabetes mellitus without complication ([place])   . Gait abnormality 11/07/2018  . Glaucoma   . Hyperlipidemia   . Hypertension   . Pulmonary embolism ([place])   . Sleep apnea   . Spinal stenosis     Patient Active Problem List   Diagnosis Date Noted  . FH: ovarian cancer in first degree relative 11/16/2018  . Hypercholesteremia 11/15/2018  . Gait abnormality 11/07/2018  . H/O bariatric surgery 10/27/2018  . Throat pain 10/25/2018  . Pain of left hip joint  05/18/2018  . Chronic midline low back pain with bilateral sciatica 05/18/2018  . Depression 03/20/2018  . Breast calcifications 12/28/2017  . Fibromyalgia 12/06/2017  . History of gastric bypass 12/06/2017  . Spinal stenosis 12/06/2017  . Hypokalemia 12/06/2017  . Asthma 12/06/2017  . Cerebellar ataxia ([place])   . Type I diabetes mellitus with complication, uncontrolled ([place])     Past Surgical History:  Procedure Laterality Date  . ABDOMINAL HYSTERECTOMY    . ADENOIDECTOMY    . ANKLE SURGERY Left   . BILATERAL CARPAL TUNNEL RELEASE    . ELBOW SURGERY    . GASTRIC BYPASS  2014  . HAND NEUROPLASTY    . TONSILLECTOMY       OB History   No obstetric history on file.      Home Medications    Prior to Admission medications   Medication Sig Start Date End Date Taking? Authorizing Provider  acetaminophen (TYLENOL) 325 MG tablet Take 650 mg by mouth every 6 (six) hours as needed.    [provider]  atorvastatin (LIPITOR) 40 MG tablet TAKE ONE TABLET BY MOUTH DAILY AT 6 PM 12/22/18   Chambliss, Jeb Levering, MD  Calcium Citrate 250 MG TABS Take 2 tablets (500 mg total) by mouth 2 (two) times daily. 01/17/18   Shamleffer, Melanie Crazier, MD  Cholecalciferol (VITAMIN D3) 75 MCG (3000 UT) TABS Take by mouth.    [provider]  Continuous Blood Gluc Receiver ([place]) DEVI  by Does not apply route.    [provider]  Continuous Blood Gluc Sensor (DEXCOM G6 SENSOR) MISC by Does not apply route.    [provider]  fluticasone (FLOVENT HFA) 220 MCG/ACT inhaler Inhale 1 puff into the lungs 2 (two) times daily. 03/20/18   Lind Covert, MD  glucose blood test strip 1 each by Other route daily. ASCENSIA CONTOUR NEXT ONE: Use to check blood sugar    [provider]  injection device for insulin (INPEN 100-PINK-NOVO) DEVI As directed 07/17/18   Shamleffer, Melanie Crazier, MD  insulin aspart (NOVOLOG) cartridge Inject 12 Units into the  skin daily with breakfast AND 12 Units daily with lunch AND 12 Units daily with supper. Max daily dose of 60 units. 01/30/19   Shamleffer, Melanie Crazier, MD  Insulin Glargine (LANTUS SOLOSTAR) 100 UNIT/ML Solostar Pen Inject 11 Units into the skin daily. 01/17/19   Shamleffer, Melanie Crazier, MD  Insulin Pen Needle (B-D UF III MINI PEN NEEDLES) 31G X 5 MM MISC Four times a day 10/17/18   Shamleffer, Melanie Crazier, MD  MICROLET LANCETS MISC Twice a day 02/07/18   Shamleffer, Melanie Crazier, MD  Multiple Vitamin (MULTIVITAMIN) capsule Take 4 capsules by mouth daily. Bariatric fusion     [provider]  mycophenolate (CELLCEPT) 500 MG tablet TAKE TWO TABLETS BY MOUTH TWICE A DAY Patient not taking: Reported on 01/24/2019 01/08/19   Kathrynn Ducking, MD  oxybutynin (DITROPAN) 5 MG tablet TAKE ONE TABLET BY MOUTH TWICE A DAY 10/18/18   Chambliss, Jeb Levering, MD  sertraline (ZOLOFT) 100 MG tablet TAKE ONE TABLET BY MOUTH DAILY 10/18/18   Lind Covert, MD  tiZANidine (ZANAFLEX) 2 MG tablet Take 2-4 mg by mouth at bedtime as needed for muscle spasms.    [provider]    Family History Family History  Problem Relation Age of Onset  . Cervical cancer Mother   . Ovarian cancer Mother   . Dementia Father   . Retinal detachment Father   . Heart attack Father   . Diabetes Father   . Stroke Father   . Hypertension Father   . Glaucoma Paternal Grandfather   . Ovarian cancer Sister     Social History Social History   Tobacco Use  . Smoking status: Never Smoker  . Smokeless tobacco: Never Used  Substance Use Topics  . Alcohol use: Never    Frequency: Never  . Drug use: Never     Allergies   Patient has no known allergies.   Review of Systems Review of Systems  Constitutional: Negative for chills, diaphoresis and fever.  Respiratory: Negative for chest tightness and shortness of breath.   Cardiovascular: Negative for chest pain.  Gastrointestinal: Positive  for abdominal pain, constipation and nausea. Negative for diarrhea, rectal pain and vomiting.     Physical Exam Updated Vital Signs BP (!) 148/78 (BP Location: Right Arm)   Pulse 67   Temp 98.5 F (36.9 C) (Oral)   Resp 16   SpO2 99%   Physical Exam Constitutional:      General: She is not in acute distress.    Appearance: She is well-developed. She is not ill-appearing.  HENT:     Head: Normocephalic and atraumatic.  Cardiovascular:     Rate and Rhythm: Normal rate and regular rhythm.     Heart sounds: Normal heart sounds. No murmur. No friction rub. No gallop.   Pulmonary:     Breath sounds: Normal breath  sounds.  Abdominal:     General: Abdomen is flat. Bowel sounds are normal. There is no distension.     Tenderness: There is abdominal tenderness (mild RLQ).  Skin:    General: Skin is warm and dry.  Neurological:     General: No focal deficit present.     Mental Status: She is alert.      ED Treatments / Results  Labs (all labs ordered are listed, but only abnormal results are displayed) Labs Reviewed  COMPREHENSIVE METABOLIC PANEL - Abnormal; Notable for the following components:      Result Value   Glucose, Bld 163 (*)    All other components within normal limits  CBG MONITORING, ED - Abnormal; Notable for the following components:   Glucose-Capillary 125 (*)    All other components within normal limits  LIPASE, BLOOD  CBC  URINALYSIS, ROUTINE W REFLEX MICROSCOPIC  CBG MONITORING, ED    EKG None  Radiology Ct Abdomen Pelvis W Contrast  Result Date: 02/06/2019 CLINICAL DATA:  Intermittent right lower quadrant pain for the past 2 weeks. EXAM: CT ABDOMEN AND PELVIS WITH CONTRAST TECHNIQUE: Multidetector CT imaging of the abdomen and pelvis was performed using the standard protocol following bolus administration of intravenous contrast. CONTRAST:  156mL OMNIPAQUE IOHEXOL 300 MG/ML  SOLN COMPARISON:  None. FINDINGS: Lower chest: No acute abnormality.  Hepatobiliary: No focal liver abnormality is seen. No gallstones, gallbladder wall thickening, or biliary dilatation. Pancreas: Mildly prominent main pancreatic duct. No mass or surrounding inflammatory changes. Spleen: Normal in size without focal abnormality. Adrenals/Urinary Tract: Adrenal glands are unremarkable. Kidneys are normal, without renal calculi, focal lesion, or hydronephrosis. Bladder is unremarkable. Stomach/Bowel: Prior Roux-en-Y gastric bypass surgery. No bowel wall thickening, distention, or surrounding inflammatory changes. Oral contrast in the colon with diffusely increased colonic stool burden. Normal appendix. Vascular/Lymphatic: Aortic atherosclerosis. No enlarged abdominal or pelvic lymph nodes. Reproductive: Status post hysterectomy. No adnexal masses. Other: No abdominal wall hernia or abnormality. No abdominopelvic ascites. No pneumoperitoneum. Musculoskeletal: No acute or significant osseous findings. IMPRESSION: 1.  No acute intra-abdominal process.  Normal appendix. 2. Prominent stool throughout the colon with residual oral contrast from swallow study almost 2 months ago. Correlate for constipation. 3.  Aortic atherosclerosis (ICD10-I70.0). Electronically Signed   By: Titus Dubin M.D.   On: 02/06/2019 19:54    Procedures Procedures (including critical care time)  Medications Ordered in ED Medications  ondansetron (ZOFRAN) tablet 4 mg (has no administration in time range)  sodium chloride flush (NS) 0.9 % injection 3 mL (3 mLs Intravenous Given 02/06/19 1753)  0.9 %  sodium chloride infusion ( Intravenous New Bag/Given 02/06/19 1843)  iohexol (OMNIPAQUE) 300 MG/ML solution 100 mL (100 mLs Intravenous Contrast Given 02/06/19 1936)     Initial Impression / Assessment and Plan / ED Course  I have reviewed the triage vital signs and the nursing notes.  Pertinent labs & imaging results that were available during my care of the patient were reviewed by me and considered  in my medical decision making (see chart for details).  Ms. Dimond, is a 68 y.o. female who presents with signs and symptoms concerning for Appendicitis.  - CT scan of abdomen and pelvis was negative for appendicitis and showed constipation. - CBC WNL - CMP WNL  - We will discharge her today. She was told to follow up with her PCP and to return to the ED if her symptoms worsen.   Final Clinical Impressions(s) / ED Diagnoses  Final diagnoses:  Constipation, unspecified constipation type    ED Discharge Orders    None       Marianna Payment, MD 02/06/19 2006    Margette Fast, MD 02/06/19 2231

## 2019-02-06 NOTE — ED Notes (Signed)
Urine culture sent to lab with urine 

## 2019-02-06 NOTE — Discharge Instructions (Addendum)
You were seen in the ED for Constipation. Thank you for allowing [place] to be part of your care.   Please follow up with you PCP as needed.    Please note these changes made to your medications:   Please START taking: OTC constipation medications like Miralax.   Please follow up with your PCP regarding these symptoms. If they do not get better or worsen, then please return to the ED.

## 2019-02-06 NOTE — ED Triage Notes (Signed)
Pt arrives via [facility] with intermittent RLQ pain.

## 2019-02-06 NOTE — ED Notes (Signed)
Pt in CT.

## 2019-02-06 NOTE — Progress Notes (Signed)
Virtual Visit via Video Note  I connected with Joyce Brown on 02/06/19 at  2:10 PM EST by a video enabled telemedicine application and verified that I am speaking with the correct person using two identifiers.  Location: Patient: phone Provider: [facility] clinic   I discussed the limitations of evaluation and management by telemedicine and the availability of in person appointments. The patient expressed understanding and agreed to proceed.  History of Present Illness: Patient woke up yesterday morning with pain in RLQ.  Pain got worse throughout the day and spread to entire lower abdomen.  Appetite has been reduced for 4-5 days, has only been taking water and boost supplements.  Some relief with lying down.  Moving around makes it worse.  No fever, no diarrhea, has had BMs with no problem.  Consistent pain seems to be getting worse, patient does note that she has a history of gastric bypass   Observations/Objective: Patient is speaking in full sentences and seems to be in her right mind, she does not appear to be in immediate extremis  Assessment and Plan: I told patient that I was unable to differentiate the various potential causes for this.  It could be something as uneventful as gas or it could be a more serious etiology like an early peritonitis versus diverticulitis versus appendicitis.  I said unfortunately we would not be able to rule this out without imaging and physical exam which at this point would require her going to the emergency department which she seems to understand, she says that she will go to [facility] emergency department for evaluation.  Follow Up Instructions:    I discussed the assessment and treatment plan with the patient. The patient was provided an opportunity to ask questions and all were answered. The patient agreed with the plan and demonstrated an understanding of the instructions.   The patient was advised to call back or seek an in-person evaluation if the  symptoms worsen or if the condition fails to improve as anticipated.  I provided 15 minutes of non-face-to-face time during this encounter.   Sherene Sires, DO

## 2019-02-06 NOTE — ED Notes (Signed)
Patient verbalizes understanding of discharge instructions. Opportunity for questioning and answers were provided. Armband removed by staff, pt discharged from ED. [facility] transport and discharged home.

## 2019-02-06 NOTE — Telephone Encounter (Signed)
Pt calls because yesterday she woke up with some abdominal pain and is worried since we are going into the holiday.   She denies fever, diarrhea, nausea or vomiting.  Her pain is better today but she "still does not have an appetite.  She is able to keep down boost shakes and water.  She would prefer not to come in if possible especially since her pain I better than yesterday, but wants to "make sure everything is ok".  Mychart visit made for this pm with Dr. Criss Rosales.  Christen Bame, CMA

## 2019-02-06 NOTE — ED Notes (Signed)
Pt unable to void at this time. 

## 2019-02-12 ENCOUNTER — Encounter: Payer: Self-pay | Admitting: Family Medicine

## 2019-02-13 ENCOUNTER — Encounter: Payer: Self-pay | Admitting: Internal Medicine

## 2019-02-16 DIAGNOSIS — Z452 Encounter for adjustment and management of vascular access device: Secondary | ICD-10-CM | POA: Diagnosis not present

## 2019-02-20 ENCOUNTER — Encounter: Payer: Self-pay | Admitting: Internal Medicine

## 2019-02-26 DIAGNOSIS — G2582 Stiff-man syndrome: Secondary | ICD-10-CM | POA: Diagnosis not present

## 2019-02-27 ENCOUNTER — Encounter: Payer: Self-pay | Admitting: Internal Medicine

## 2019-03-06 ENCOUNTER — Encounter: Payer: Self-pay | Admitting: Family Medicine

## 2019-03-12 ENCOUNTER — Ambulatory Visit: Payer: Self-pay | Admitting: General Surgery

## 2019-03-13 ENCOUNTER — Encounter: Payer: Self-pay | Admitting: Internal Medicine

## 2019-03-13 ENCOUNTER — Other Ambulatory Visit: Payer: Self-pay | Admitting: Internal Medicine

## 2019-03-13 MED ORDER — LANTUS SOLOSTAR 100 UNIT/ML ~~LOC~~ SOPN: 12.0000 [IU] | PEN_INJECTOR | Freq: Every day | SUBCUTANEOUS | 3 refills | Status: DC

## 2019-03-22 ENCOUNTER — Other Ambulatory Visit: Payer: Self-pay | Admitting: Family Medicine

## 2019-03-22 ENCOUNTER — Encounter: Payer: Medicare Other | Attending: Internal Medicine | Admitting: Dietician

## 2019-03-22 ENCOUNTER — Other Ambulatory Visit: Payer: Self-pay

## 2019-03-22 DIAGNOSIS — E1065 Type 1 diabetes mellitus with hyperglycemia: Secondary | ICD-10-CM | POA: Diagnosis not present

## 2019-03-22 DIAGNOSIS — E108 Type 1 diabetes mellitus with unspecified complications: Secondary | ICD-10-CM | POA: Diagnosis not present

## 2019-03-22 DIAGNOSIS — Z1211 Encounter for screening for malignant neoplasm of colon: Secondary | ICD-10-CM

## 2019-03-22 DIAGNOSIS — IMO0002 Reserved for concepts with insufficient information to code with codable children: Secondary | ICD-10-CM

## 2019-03-22 NOTE — Patient Instructions (Signed)
Consider options that are high in fiber such as beans, greens, sweet potatoes, whole grains, berries, and other fresh fruits and vegetables.  Be sure to include a protein with each meal.  Continue to stay active Continue to observe the patterns in your blood sugar Continue to take your insulin as prescribed

## 2019-03-22 NOTE — Progress Notes (Signed)
amb refSubjective  Joyce Brown is a 69 y.o. female is presenting with the following   Chief Complaint noted Review of Symptoms - see HPI PMH - Smoking status noted.    Objective Vital Signs reviewed There were no vitals taken for this visit.  Assessments/Plans  No problem-specific Assessment & Plan notes found for this encounter.   See after visit summary for details of patient instructions

## 2019-03-26 ENCOUNTER — Encounter: Payer: Self-pay | Admitting: Internal Medicine

## 2019-03-26 ENCOUNTER — Telehealth: Payer: Self-pay | Admitting: Internal Medicine

## 2019-03-26 ENCOUNTER — Encounter: Payer: Self-pay | Admitting: Family Medicine

## 2019-03-26 MED ORDER — LANTUS SOLOSTAR 100 UNIT/ML ~~LOC~~ SOPN: 14.0000 [IU] | PEN_INJECTOR | Freq: Every day | SUBCUTANEOUS | 3 refills | Status: DC

## 2019-03-26 NOTE — Telephone Encounter (Signed)
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   Current prandial dose is 12  SF 55     Abby Nena Jordan, MD  [facility]  [facility] [address] [place], [place] [address] Phone: [phone] FAX: [phone]

## 2019-03-29 ENCOUNTER — Encounter: Payer: Self-pay | Admitting: Dietician

## 2019-03-29 NOTE — Progress Notes (Signed)
Diabetes Self-Management Education  Visit Type: Follow-up  Appt. Start Time: 0910 Appt. End Time: 0940  03/29/2019  [name], identified by name and date of birth, is a 69 y.o. female with a diagnosis of Diabetes:  .   ASSESSMENT Patient was here today alone.  She was last seen by me 10/22 but has had frequent follow up by MD and RD via phone.  Patient states that she has had poor sleep and higher blood sugar readings in the am.  BG was 463 this am but she questions if she ate first.  She took 17.5 units of Novolog I reviewed her Dexcom record.  She continues to use the InPen She continues to track her food (fit bit app) and the report states that she eats 40% fat, 18% protein, and the remainder is carbohydrate. She continues to wheel her chair around her building for 30-45 minutes daily. Weight was 167 lbs 12/28.   Last A1C 9.7% 01/04/2019 Medication at the time of the visit was Lantus 12 units before breakfast and Novolog 12 units with each meal plus additional based on her InPen.  Her sensitivity factor was 55.  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  Type 1 diabetes since 2007, asthma, cerebellar ataxia, HTN, HLD, OSA- on C-pap and sleep study July 6. She has had a Dexcom G-6 since 07/11/18.Inpen recently.  Patient lives alone. She uses a wheel chair. She is doing her own grocery shopping. She uses [employer] for medical appointments but is able to wheel herself to the grocery store. 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 She moved here from [place] last year to be closer to one of her sister's who lives in [place]. The pool at her apartment is to reopen outdoors. Patient enjoys walking in the pool.  She last saw an RD in [place] about 11 months ago.  Diabetes Self-Management Education - 03/29/19 1703      Visit Information   Visit Type   Follow-up      Psychosocial Assessment   Patient Belief/Attitude about Diabetes  Motivated to manage diabetes    Other persons present  Patient    Patient Concerns  Nutrition/Meal planning;Glycemic Control    Special Needs  None    Preferred Learning Style  No preference indicated    Learning Readiness  Ready      Pre-Education Assessment   Patient understands the diabetes disease and treatment process.  Demonstrates understanding / competency    Patient understands incorporating nutritional management into lifestyle.  Needs Review    Patient undertands incorporating physical activity into lifestyle.  Demonstrates understanding / competency    Patient understands using medications safely.  Demonstrates understanding / competency    Patient understands monitoring blood glucose, interpreting and using results  Demonstrates understanding / competency    Patient understands prevention, detection, and treatment of acute complications.  Demonstrates understanding / competency    Patient understands prevention, detection, and treatment of chronic complications.  Demonstrates understanding / competency    Patient understands how to develop strategies to address psychosocial issues.  Demonstrates understanding / competency    Patient understands how to develop strategies to promote health/change behavior.  Needs Review      Complications   How often do you check your blood sugar?  > 4 times/day   dexcom   Fasting Blood glucose range (mg/dL)  >200;180-200    Postprandial Blood glucose range (mg/dL)  180-200;>200      Dietary Intake   Breakfast  Burrito made in a multi-grain flax wrap OR LF [place] bacon, egg white, cheese sandwhich OR cheerios with milk, almonds, and an apple    Snack (morning)  none    Lunch  side salad with soup (bean or pumpkin sweet potato ginger or Frozen Chicken Marsala    Snack (afternoon)  none    Dinner  Building control surveyor, broccoli OR rotisori chicken, broccoli,  sweet potatoes    Snack (evening)  none and does not tolerate sweets    Beverage(s)  water      Exercise   Exercise Type  Light (walking / raking leaves)   wheels around her apartment most days   How many days per week to you exercise?  6    How many minutes per day do you exercise?  45    Total minutes per week of exercise  270      Patient Education   Previous Diabetes Education  Yes (please comment)   10/21   Nutrition management   Role of diet in the treatment of diabetes and the relationship between the three main macronutrients and blood glucose level;Meal options for control of blood glucose level and chronic complications.    Physical activity and exercise   Identified with patient nutritional and/or medication changes necessary with exercise.    Medications  Taught/reviewed insulin injection, site rotation, insulin storage and needle disposal.    Monitoring  Other (comment)   evaluated dexcom report     Individualized Goals (developed by patient)   Nutrition  General guidelines for healthy choices and portions discussed    Physical Activity  Exercise 5-7 days per week;30 minutes per day    Medications  take my medication as prescribed    Monitoring   test my blood glucose as discussed    Reducing Risk  increase portions of healthy fats    Health Coping  discuss diabetes with (comment)   MD, RD, CDE     Patient Self-Evaluation of Goals - Patient rates self as meeting previously set goals (% of time)   Nutrition  >75%    Physical Activity  >75%    Medications  >75%    Monitoring  >75%    Problem Solving  >75%    Reducing Risk  >75%    Health Coping  >75%      Post-Education Assessment   Patient understands the diabetes disease and treatment process.  Demonstrates understanding / competency    Patient understands incorporating nutritional management into lifestyle.  Needs Review    Patient undertands incorporating physical activity into lifestyle.  Demonstrates  understanding / competency    Patient understands using medications safely.  Demonstrates understanding / competency    Patient understands monitoring blood glucose, interpreting and using results  Demonstrates understanding / competency    Patient understands prevention, detection, and treatment of acute complications.  Demonstrates understanding / competency    Patient understands prevention, detection, and treatment of chronic complications.  Demonstrates understanding / competency    Patient understands how to develop strategies to address psychosocial issues.  Demonstrates understanding / competency    Patient understands how to develop strategies to promote health/change behavior.  Needs Review      Outcomes   Expected Outcomes  Demonstrated interest in learning. Expect positive outcomes    Future DMSE  2 months    Program Status  Completed       Individualized  Plan for Diabetes Self-Management Training:   Learning Objective:  Patient will have a greater understanding of diabetes self-management. Patient education plan is to attend individual and/or group sessions per assessed needs and concerns.   Plan:   Patient Instructions  Consider options that are high in fiber such as beans, greens, sweet potatoes, whole grains, berries, and other fresh fruits and vegetables.  Be sure to include a protein with each meal.  Continue to stay active Continue to observe the patterns in your blood sugar Continue to take your insulin as prescribed   Expected Outcomes:  Demonstrated interest in learning. Expect positive outcomes  Education material provided:   If problems or questions, patient to contact team via:  Phone and Email  Future DSME appointment: 2 months

## 2019-04-02 ENCOUNTER — Other Ambulatory Visit (HOSPITAL_COMMUNITY)
Admission: RE | Admit: 2019-04-02 | Discharge: 2019-04-02 | Disposition: A | Discharge status: Home / Self Care | Payer: Medicare Other | Source: Ambulatory Visit | Attending: General Surgery | Admitting: General Surgery

## 2019-04-02 DIAGNOSIS — Z01812 Encounter for preprocedural laboratory examination: Secondary | ICD-10-CM | POA: Diagnosis not present

## 2019-04-02 DIAGNOSIS — Z20822 Contact with and (suspected) exposure to covid-19: Secondary | ICD-10-CM | POA: Diagnosis not present

## 2019-04-03 ENCOUNTER — Other Ambulatory Visit: Payer: Self-pay | Admitting: *Deleted

## 2019-04-03 ENCOUNTER — Encounter: Payer: Self-pay | Admitting: Internal Medicine

## 2019-04-03 ENCOUNTER — Telehealth: Payer: Self-pay

## 2019-04-03 ENCOUNTER — Encounter: Payer: Self-pay | Admitting: Family Medicine

## 2019-04-03 LAB — NOVEL CORONAVIRUS, NAA (HOSP ORDER, SEND-OUT TO REF LAB; TAT 18-24 HRS): SARS-CoV-2, NAA: NOT DETECTED

## 2019-04-03 MED ORDER — TIZANIDINE HCL 2 MG PO TABS: 2.0000 mg | ORAL_TABLET | Freq: Every evening | ORAL | 3 refills | Status: DC | PRN

## 2019-04-03 MED ORDER — INSULIN ASPART 100 UNIT/ML CARTRIDGE (PENFILL): SUBCUTANEOUS | 3 refills | Status: DC

## 2019-04-03 NOTE — Telephone Encounter (Signed)
Rec'd records from [facility] forwarded 4 pages to [facility] Provider Historical

## 2019-04-05 ENCOUNTER — Telehealth: Payer: Self-pay | Admitting: Internal Medicine

## 2019-04-05 ENCOUNTER — Encounter (HOSPITAL_BASED_OUTPATIENT_CLINIC_OR_DEPARTMENT_OTHER): Admission: RE | Payer: Self-pay | Source: Home / Self Care

## 2019-04-05 ENCOUNTER — Ambulatory Visit (HOSPITAL_BASED_OUTPATIENT_CLINIC_OR_DEPARTMENT_OTHER): Admission: RE | Admit: 2019-04-05 | Payer: Medicare Other | Source: Home / Self Care | Admitting: General Surgery

## 2019-04-05 SURGERY — REMOVAL PORT-A-CATH
Anesthesia: Monitor Anesthesia Care

## 2019-04-05 NOTE — Telephone Encounter (Signed)
Thanks

## 2019-04-05 NOTE — Telephone Encounter (Signed)
Please let me know what I need to do with this information. I see no GI records/notes in EPIC. Thank you.

## 2019-04-05 NOTE — Telephone Encounter (Signed)
The scheduler was alerting you that they would be delivered to your desk for review when Pamala Hurry is back tomorrow morning.

## 2019-04-07 ENCOUNTER — Other Ambulatory Visit (HOSPITAL_COMMUNITY): Payer: Medicare Other

## 2019-04-07 ENCOUNTER — Other Ambulatory Visit: Payer: Self-pay | Admitting: Neurology

## 2019-04-09 DIAGNOSIS — G2582 Stiff-man syndrome: Secondary | ICD-10-CM | POA: Diagnosis not present

## 2019-04-14 ENCOUNTER — Other Ambulatory Visit: Payer: Self-pay | Admitting: Family Medicine

## 2019-04-17 ENCOUNTER — Encounter: Payer: Self-pay | Admitting: Family Medicine

## 2019-04-17 ENCOUNTER — Encounter: Payer: Self-pay | Admitting: Internal Medicine

## 2019-04-17 ENCOUNTER — Other Ambulatory Visit: Payer: Self-pay

## 2019-04-19 ENCOUNTER — Other Ambulatory Visit: Payer: Self-pay

## 2019-04-19 ENCOUNTER — Encounter: Payer: Self-pay | Admitting: Internal Medicine

## 2019-04-19 ENCOUNTER — Ambulatory Visit (INDEPENDENT_AMBULATORY_CARE_PROVIDER_SITE_OTHER): Payer: Medicare Other | Admitting: Internal Medicine

## 2019-04-19 ENCOUNTER — Telehealth: Payer: Self-pay | Admitting: Internal Medicine

## 2019-04-19 VITALS — BP 118/72 | HR 77 | Temp 98.3°F | Ht 70.0 in | Wt 166.0 lb

## 2019-04-19 DIAGNOSIS — E1065 Type 1 diabetes mellitus with hyperglycemia: Secondary | ICD-10-CM | POA: Diagnosis not present

## 2019-04-19 LAB — POCT GLYCOSYLATED HEMOGLOBIN (HGB A1C): Hemoglobin A1C: 8.6 % — AB (ref 4.0–5.6)

## 2019-04-19 LAB — MICROALBUMIN / CREATININE URINE RATIO
Creatinine,U: 169.7 mg/dL
Microalb Creat Ratio: 1.8 mg/g (ref 0.0–30.0)
Microalb, Ur: 3.1 mg/dL — ABNORMAL HIGH (ref 0.0–1.9)

## 2019-04-19 MED ORDER — "INSULIN SYRINGE-NEEDLE U-100 31G X 15/64"" 0.5 ML MISC": 1.0000 | 6 refills | Status: DC

## 2019-04-19 MED ORDER — INSULIN ASPART 100 UNIT/ML CARTRIDGE (PENFILL): SUBCUTANEOUS | 3 refills | Status: DC

## 2019-04-19 MED ORDER — "INSULIN SYRINGE-NEEDLE U-100 31G X 15/64"" 0.5 ML MISC": 1.0000 | Freq: Every day | 6 refills | Status: DC

## 2019-04-19 MED ORDER — LANTUS SOLOSTAR 100 UNIT/ML ~~LOC~~ SOPN: 15.0000 [IU] | PEN_INJECTOR | Freq: Every day | SUBCUTANEOUS | 11 refills | Status: DC

## 2019-04-19 NOTE — Progress Notes (Signed)
Name: Joyce Brown  Age/ Sex: 69 y.o., female   MRN/ DOB: [id number], 20-Aug-1950     PCP: Lind Covert, MD   Reason for Endocrinology Evaluation: Type 1 Diabetes Mellitus  Initial Endocrine Consultative Visit: 01/17/2018    PATIENT IDENTIFIER: [name] is a 69 y.o. female with a past medical history of T1DM, cerebellar atrophy, asthma, OSA on CPAP and Hx of gastric bypass in 2014  . The patient has followed with Endocrinology clinic since 01/17/18 for consultative assistance with management of her diabetes.  Patient moved from [place] in September, 2019 to be close to her sister. She has been diagnosed with stiff person Syndrome but Dr. Jannifer Franklin thought this may be more of cerebellar atrophy. She was on  IVIG until 11/07/2018. 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 She has only had one DKA episode which was in 11/2017. Marland Kitchen Her hemoglobin A1c has ranged from 8.4% in 01/2017, peaking at 12.1 % in 01/2016  She is followed by Dr. 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 She has been off IVIG since 02/2018. He has been tapering her diazepam down and is currently on cellcept.    Started using the inpen 07/2018  SUBJECTIVE:   During the last visit (05/04/2018): Continued lantus and prandial insulin    Today (04/19/2019): Joyce Brown is here for a 3 months follow up visit on diabetes.  She checks her blood sugars multiple times daily, using  CGM (Dexcom). The patient has had hypoglycemic episodes since the last clinic visit, which typically occur multiple x / week- most often occuring after dinner bolus. The patient is symptomatic with these episodes. Otherwise, the patient has not required any recent emergency interventions for hypoglycemia and hasnot had recent hospitalizations secondary to hyper or  hypoglycemic episodes.   She continues to use  the in-pen and likes it a lot   Her port removal procedure was cancelled  She is having issues with her cartridges and Lantus pens where there is 12 to 15 units of NovoLog left in the pen and the plunger is all the way down, she also has up to 50 units of Lantus left in the pen and the plunger is all the way down    ROS: As per HPI and as detailed below: Review of Systems  Respiratory: Negative for cough and shortness of breath.   Cardiovascular: Negative for chest pain and palpitations.  Gastrointestinal: Negative for diarrhea and nausea.  Endo/Heme/Allergies: Negative for polydipsia.      HOME DIABETES REGIMEN:  Lantus 14 units QHS Novolog 12 units  With Breakfast, 12 units with lunch and 10 units with supper  CF (BG - 110/55)    CONTINUOUS GLUCOSE MONITORING RECORD INTERPRETATION    Dates of Recording:1/22-04/19/2019 Sensor description:Dexcom  Results statistics:   CGM use % of time 93  Average and SD 220  Time in range   31  %  % Time Above 180 31  % Time above 250 36  % Time Below target <1    Glycemic patterns summary: Hyperglycemia during the day and sometimes during the night, no specific pattern  Hyperglycemic episodes  Postprandial    Hypoglycemic episodes occurred postprandial , dawn phenomenon is noted  Overnight periods: steady   IN-Pen report 1/21-04/18/2019  Average 222 Missed doses:  4 SD 81  TDD 52.2  HISTORY:  Past Medical History:  Past Medical History:  Diagnosis Date  . Asthma   . Cerebellar ataxia ([place])   . Diabetes mellitus without complication ([place])   . Gait abnormality 11/07/2018  . Glaucoma   . Hyperlipidemia   . Hypertension   . Pulmonary embolism ([place])   . Sleep apnea   . Spinal stenosis    Past Surgical History:  Past Surgical History:  Procedure Laterality Date  . ABDOMINAL HYSTERECTOMY    . ADENOIDECTOMY    . ANKLE SURGERY Left   . BILATERAL CARPAL TUNNEL RELEASE    .  ELBOW SURGERY    . GASTRIC BYPASS  2014  . HAND NEUROPLASTY    . TONSILLECTOMY      Social History:  reports that she has never smoked. She has never used smokeless tobacco. She reports that she does not drink alcohol or use drugs. Family History:  Family History  Problem Relation Age of Onset  . Cervical cancer Mother   . Ovarian cancer Mother   . Dementia Father   . Retinal detachment Father   . Heart attack Father   . Diabetes Father   . Stroke Father   . Hypertension Father   . Glaucoma Paternal Grandfather   . Ovarian cancer Sister      HOME MEDICATIONS: Allergies as of 04/19/2019   No Known Allergies     Medication List       Accurate as of April 19, 2019  9:55 AM. If you have any questions, ask your nurse or doctor.        acetaminophen 325 MG tablet Commonly known as: TYLENOL Take 650 mg by mouth every 6 (six) hours as needed for moderate pain.   atorvastatin 40 MG tablet Commonly known as: LIPITOR TAKE ONE TABLET BY MOUTH DAILY AT 6 PM What changed: See the new instructions.   B-D UF III MINI PEN NEEDLES 31G X 5 MM Misc Generic drug: Insulin Pen Needle Four times a day What changed:   how much to take  how to take this  when to take this  additional instructions   Calcium Citrate 250 MG Tabs Take 2 tablets (500 mg total) by mouth 2 (two) times daily.   Dexcom G6 Receiver Devi by Does not apply route.   Dexcom G6 Sensor Misc 1 each by Other route daily.   fluticasone 220 MCG/ACT inhaler Commonly known as: FLOVENT HFA Inhale 1 puff into the lungs 2 (two) times daily.   glucose blood test strip 1 each by Other route daily. ASCENSIA CONTOUR NEXT ONE: Use to check blood sugar   injection device for insulin Devi Commonly known as: InPen 100-Pink-Novo As directed What changed:   how much to take  how to take this  when to take this  additional instructions   insulin aspart cartridge Commonly known as: NOVOLOG Inject 12 Units  into the skin daily with breakfast AND 12 Units daily with lunch AND 10 Units daily with supper. Max daily dose of 60 units.   Insulin Syringe-Needle U-100 31G X 15/64" 0.5 ML Misc 1 Device by Does not apply route as directed. Every 10 days What changed:   how much to take  when to take this Changed by: Dorita Sciara, MD   Lantus SoloStar 100 UNIT/ML Solostar Pen Generic drug: Insulin Glargine Inject 14 Units into the skin daily.   Microlet Lancets Misc Twice a day What changed:   how much to take  how to take this  when to take this  additional instructions   multivitamin capsule Take 4 capsules by mouth daily. Bariatric fusion   mycophenolate 500 MG tablet Commonly known as: CELLCEPT TAKE TWO TABLETS BY MOUTH TWICE A DAY   oxybutynin 5 MG tablet Commonly known as: DITROPAN TAKE ONE TABLET BY MOUTH TWICE A DAY   sertraline 50 MG tablet Commonly known as: ZOLOFT Take 1 tablet (50 mg total) by mouth daily.   tiZANidine 2 MG tablet Commonly known as: ZANAFLEX Take 1-2 tablets (2-4 mg total) by mouth at bedtime as needed for muscle spasms.   Vitamin D3 75 MCG (3000 UT) Tabs Take 75 mcg by mouth daily.       PHYSICAL EXAM: VS: BP 118/72 (BP Location: Left Arm, Patient Position: Sitting, Cuff Size: Large)   Pulse 77   Temp 98.3 F (36.8 C)   Ht 5\' 10"  (1.778 m)   Wt 166 lb (75.3 kg)   SpO2 98%   BMI 23.82 kg/m    EXAM: General: Pt appears well and is in NAD  Lungs: Clear with good BS bilat with no rales, rhonchi, or wheezes  Heart: Auscultation: RRR with normal S1 and S2, no gallops or murmurs  Extremities:  BL LE: no pretibial edema   Mental Status: Judgment, insight: intact Mood and affect: no depression, anxiety, or agitation   DM Foot Exam 04/19/2019 The skin of the feet is intact without sores or ulcerations. The pedal pulses are 2+ on right and 2+ on left. The sensation is intact to a screening 5.07, 10 gram monofilament  bilaterally   DATA REVIEWED:  Lab Results  Component Value Date   HGBA1C 8.6 (A) 04/19/2019   HGBA1C 9.7 (A) 01/04/2019   HGBA1C 5.9 (A) 08/17/2018   Lab Results  Component Value Date   MICROALBUR 2.3 (H) 02/07/2018   LDLCALC 102 (H) 01/17/2019   CREATININE 0.90 02/06/2019       ASSESSMENT / PLAN / RECOMMENDATIONS:   1) Type 1 Diabetes Mellitus, Poorly controlled, With Hx of hypoglycemia unawareness - Most recent A1c of 8.6 %. Goal A1c < 7.5 %.    -Today we have reviewed her Dexcom download and independent download, she has been noted with improved glycemic control, I am very pleased with the progress thus far -In review of the) download, she has received a couple of correction doses at bedtime with no hypoglycemia overnight so I believe her sensitivity factor of 55 is correct at this point - She does however tend to have hypoglycemia after her dinner meal followed by over -correction.  -She was advised to contact Lantus and NovoLog cartridge manufactures to report the plunger malfunction.    MEDICATIONS:  Increase Lantus to 15 units daily   Continue Novolog 12 units with breakfast and lunch 12 units , decreased to 9 units with  supper   Sensitivity factor  55    EDUCATION / INSTRUCTIONS:  BG monitoring instructions: Patient is instructed to check her blood sugars 4 times a day with CGM caliberation.  Call [facility] if: BG persistently < 70 or > 300. . I reviewed the Rule of 15 for the treatment of hypoglycemia in detail with the patient.     2) Diabetic complications:   Eye: Does not have known diabetic retinopathy.   Neuro/ Feet: Does not have known diabetic peripheral neuropathy.  Renal: Patient does not have known baseline CKD. She is not on an ACEI/ARB at present. Normal microalbuminuria .  F/U in 3 months    Signed electronically by: Mack Guise, MD  [facility] End[facility]  [address] [place], [place] [address] Phone: [phone] FAX: [phone]   CC: Lind Covert, [place] [address] Phone: [phone]  Fax: [phone]  Return to Endocrinology clinic as below: Future Appointments  Date Time Provider [place]  04/27/2019 11:15 AM Marzetta Board, DPM [facility]  05/14/2019 12:00 PM Kathrynn Ducking, MD [facility] None  05/17/2019  9:00 AM Valora Piccolo Barnabas Lister, RD [facility]

## 2019-04-19 NOTE — Telephone Encounter (Signed)
Nikolai at [address] called to get clarification on:   Insulin Syringe-Needle U-100 31G X 15/64" 0.5 ML MISC [id number]  They asked for 3 things:  #1 - quantity to be changed to 100 instead of 50 #2 - frequency of usage #3 - size of needles

## 2019-04-19 NOTE — Patient Instructions (Signed)
Increase Lantus to 15 units daily  Novolog 12 units with Breakfast , 12 units with Lunch and 9 units supper     HOW TO TREAT LOW BLOOD SUGARS (Blood sugar LESS THAN 70 MG/DL)  Please follow the RULE OF 15 for the treatment of hypoglycemia treatment (when your (blood sugars are less than 70 mg/dL)    STEP 1: Take 15 grams of carbohydrates when your blood sugar is low, which includes:   3-4 GLUCOSE TABS  OR  3-4 OZ OF JUICE OR REGULAR SODA OR  ONE TUBE OF GLUCOSE GEL     STEP 2: RECHECK blood sugar in 15 MINUTES STEP 3: If your blood sugar is still low at the 15 minute recheck --> then, go back to STEP 1 and treat AGAIN with another 15 grams of carbohydrates.

## 2019-04-20 ENCOUNTER — Encounter: Payer: Self-pay | Admitting: Family Medicine

## 2019-04-27 ENCOUNTER — Other Ambulatory Visit: Payer: Self-pay

## 2019-04-27 ENCOUNTER — Encounter: Payer: Self-pay | Admitting: Podiatry

## 2019-04-27 ENCOUNTER — Ambulatory Visit (INDEPENDENT_AMBULATORY_CARE_PROVIDER_SITE_OTHER): Payer: Medicare Other | Admitting: Podiatry

## 2019-04-27 DIAGNOSIS — M79674 Pain in right toe(s): Secondary | ICD-10-CM

## 2019-04-27 DIAGNOSIS — B351 Tinea unguium: Secondary | ICD-10-CM

## 2019-04-27 DIAGNOSIS — M79675 Pain in left toe(s): Secondary | ICD-10-CM

## 2019-04-27 NOTE — Patient Instructions (Signed)
Diabetes Mellitus and Foot Care Foot care is an important part of your health, especially when you have diabetes. Diabetes may cause you to have problems because of poor blood flow (circulation) to your feet and legs, which can cause your skin to:  Become thinner and drier.  Break more easily.  Heal more slowly.  Peel and crack. You may also have nerve damage (neuropathy) in your legs and feet, causing decreased feeling in them. This means that you may not notice minor injuries to your feet that could lead to more serious problems. Noticing and addressing any potential problems early is the best way to prevent future foot problems. How to care for your feet Foot hygiene  Wash your feet daily with warm water and mild soap. Do not use hot water. Then, pat your feet and the areas between your toes until they are completely dry. Do not soak your feet as this can dry your skin.  Trim your toenails straight across. Do not dig under them or around the cuticle. File the edges of your nails with an emery board or nail file.  Apply a moisturizing lotion or petroleum jelly to the skin on your feet and to dry, brittle toenails. Use lotion that does not contain alcohol and is unscented. Do not apply lotion between your toes. Shoes and socks  Wear clean socks or stockings every day. Make sure they are not too tight. Do not wear knee-high stockings since they may decrease blood flow to your legs.  Wear shoes that fit properly and have enough cushioning. Always look in your shoes before you put them on to be sure there are no objects inside.  To break in new shoes, wear them for just a few hours a day. This prevents injuries on your feet. Wounds, scrapes, corns, and calluses  Check your feet daily for blisters, cuts, bruises, sores, and redness. If you cannot see the bottom of your feet, use a mirror or ask someone for help.  Do not cut corns or calluses or try to remove them with medicine.  If you  find a minor scrape, cut, or break in the skin on your feet, keep it and the skin around it clean and dry. You may clean these areas with mild soap and water. Do not clean the area with peroxide, alcohol, or iodine.  If you have a wound, scrape, corn, or callus on your foot, look at it several times a day to make sure it is healing and not infected. Check for: ? Redness, swelling, or pain. ? Fluid or blood. ? Warmth. ? Pus or a bad smell. General instructions  Do not cross your legs. This may decrease blood flow to your feet.  Do not use heating pads or hot water bottles on your feet. They may burn your skin. If you have lost feeling in your feet or legs, you may not know this is happening until it is too late.  Protect your feet from hot and cold by wearing shoes, such as at the beach or on hot pavement.  Schedule a complete foot exam at least once a year (annually) or more often if you have foot problems. If you have foot problems, report any cuts, sores, or bruises to your health care provider immediately. Contact a health care provider if:  You have a medical condition that increases your risk of infection and you have any cuts, sores, or bruises on your feet.  You have an injury that is not   healing.  You have redness on your legs or feet.  You feel burning or tingling in your legs or feet.  You have pain or cramps in your legs and feet.  Your legs or feet are numb.  Your feet always feel cold.  You have pain around a toenail. Get help right away if:  You have a wound, scrape, corn, or callus on your foot and: ? You have pain, swelling, or redness that gets worse. ? You have fluid or blood coming from the wound, scrape, corn, or callus. ? Your wound, scrape, corn, or callus feels warm to the touch. ? You have pus or a bad smell coming from the wound, scrape, corn, or callus. ? You have a fever. ? You have a red line going up your leg. Summary  Check your feet every day  for cuts, sores, red spots, swelling, and blisters.  Moisturize feet and legs daily.  Wear shoes that fit properly and have enough cushioning.  If you have foot problems, report any cuts, sores, or bruises to your health care provider immediately.  Schedule a complete foot exam at least once a year (annually) or more often if you have foot problems. This information is not intended to replace advice given to you by your health care provider. Make sure you discuss any questions you have with your health care provider. Document Revised: 11/22/2018 Document Reviewed: 04/02/2016 Elsevier Patient Education  2020 [employer]  

## 2019-04-27 NOTE — Progress Notes (Signed)
Subjective: Joyce Brown presents today for follow up of preventative diabetic foot care and painful mycotic nails b/l that are difficult to trim. Pain interferes with ambulation. Aggravating factors include wearing enclosed shoe gear. Pain is relieved with periodic professional debridement.   No Known Allergies   Objective: There were no vitals filed for this visit.  Vascular Examination:  Capillary refill time to digits immediate b/l, palpable DP pulses b/l, palpable PT pulses b/l, pedal hair absent b/l and skin temperature gradient within normal limits b/l  Dermatological Examination: Pedal skin with normal turgor, texture and tone bilaterally, no open wounds bilaterally, no interdigital macerations bilaterally and toenails 1-5 b/l elongated, dystrophic, thickened, crumbly with subungual debris  Musculoskeletal: Normal muscle strength 5/5 to all lower extremity muscle groups bilaterally, no pain crepitus or joint limitation noted with ROM b/l, bunion deformity noted b/l, hammertoes noted to the  2-5 bilaterally and utilizes wheelchair for mobility assistance  Neurological: Protective sensation intact 5/5 intact bilaterally with 10g monofilament b/l, vibratory sensation intact b/l and proprioception intact bilaterally  Assessment: No diagnosis found.  Plan: -Continue diabetic foot care principles. Literature dispensed on today.  -Toenails 1-5 b/l were debrided in length and girth without iatrogenic bleeding. -Patient to continue soft, supportive shoe gear daily. -Patient to report any pedal injuries to medical professional immediately. -Patient/POA to call should there be question/concern in the interim.  Return in about 3 months (around 07/25/2019) for diabetic nail trim.

## 2019-04-29 ENCOUNTER — Encounter: Payer: Self-pay | Admitting: Family Medicine

## 2019-04-29 ENCOUNTER — Ambulatory Visit: Payer: Medicare Other | Attending: Internal Medicine

## 2019-04-29 DIAGNOSIS — Z23 Encounter for immunization: Secondary | ICD-10-CM | POA: Insufficient documentation

## 2019-04-29 NOTE — Progress Notes (Signed)
   Covid-19 Vaccination Clinic  Name:  Joyce Brown    MRN: [id number] DOB: 1950/08/18  04/29/2019  [name] was observed post Covid-19 immunization for 15 minutes without incidence. She was provided with Vaccine Information Sheet and instruction to access the V-Safe system.   [name] was instructed to call 911 with any severe reactions post vaccine: Marland Kitchen Difficulty breathing  . Swelling of your face and throat  . A fast heartbeat  . A bad rash all over your body  . Dizziness and weakness    Immunizations Administered    Name Date Dose VIS Date Route   Pfizer COVID-19 Vaccine 04/29/2019  9:17 AM 0.3 mL 02/23/2019 Intramuscular   Manufacturer: [place]   Lot: [id number]   [place]: [id number]

## 2019-05-01 ENCOUNTER — Encounter: Payer: Self-pay | Admitting: Internal Medicine

## 2019-05-02 MED ORDER — LANTUS SOLOSTAR 100 UNIT/ML ~~LOC~~ SOPN: 16.0000 [IU] | PEN_INJECTOR | Freq: Every day | SUBCUTANEOUS | 11 refills | Status: DC

## 2019-05-03 ENCOUNTER — Other Ambulatory Visit: Payer: Self-pay

## 2019-05-03 ENCOUNTER — Encounter: Payer: Self-pay | Admitting: Internal Medicine

## 2019-05-03 MED ORDER — INPEN 100-PINK-NOVO DEVI: 0 refills | Status: DC

## 2019-05-04 ENCOUNTER — Other Ambulatory Visit: Payer: Self-pay | Admitting: Internal Medicine

## 2019-05-04 MED ORDER — TRESIBA FLEXTOUCH 100 UNIT/ML ~~LOC~~ SOPN: 16.0000 [IU] | PEN_INJECTOR | Freq: Every day | SUBCUTANEOUS | 3 refills | Status: DC

## 2019-05-07 DIAGNOSIS — G2582 Stiff-man syndrome: Secondary | ICD-10-CM | POA: Diagnosis not present

## 2019-05-14 ENCOUNTER — Encounter: Payer: Self-pay | Admitting: Neurology

## 2019-05-14 ENCOUNTER — Ambulatory Visit (INDEPENDENT_AMBULATORY_CARE_PROVIDER_SITE_OTHER): Payer: Medicare Other | Admitting: Neurology

## 2019-05-14 ENCOUNTER — Other Ambulatory Visit: Payer: Self-pay

## 2019-05-14 VITALS — BP 159/74 | HR 69

## 2019-05-14 DIAGNOSIS — G119 Hereditary ataxia, unspecified: Secondary | ICD-10-CM

## 2019-05-14 DIAGNOSIS — Z5181 Encounter for therapeutic drug level monitoring: Secondary | ICD-10-CM | POA: Diagnosis not present

## 2019-05-14 NOTE — Progress Notes (Signed)
Reason for visit: Cerebellar ataxia  Joyce Brown is an 69 y.o. 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  The patient was being treated with high-dose diazepam even though she did not have stiff person syndrome, this was slowly tapered, the patient was taken off of IVIG which was not helpful, she has done well off medications.  She has not yet gotten her Port-A-Cath removed which should be done soon.  The patient has not had any falls since last seen, she lives independently, she uses a wheelchair or a walker to get around.  She can only walk short distances inside the house.  The patient has undergone a modified barium swallow, a dysphagia 3 diet with thin liquids was recommended.  She returns to the office today for an evaluation.  She remains fairly stable, she is still on CellCept.  Past Medical History:  Diagnosis Date  . Asthma   . Cerebellar ataxia ([place])   . Diabetes mellitus without complication ([place])   . Gait abnormality 11/07/2018  . Glaucoma   . Hyperlipidemia   . Hypertension   . Pulmonary embolism ([place])   . Sleep apnea   . Spinal stenosis     Past Surgical History:  Procedure Laterality Date  . ABDOMINAL HYSTERECTOMY    . ADENOIDECTOMY    . ANKLE SURGERY Left   . BILATERAL CARPAL TUNNEL RELEASE    . ELBOW SURGERY    . GASTRIC BYPASS  2014  . HAND NEUROPLASTY    . TONSILLECTOMY      Family History  Problem Relation Age of Onset  . Cervical cancer Mother   . Ovarian cancer Mother   . Dementia Father   . Retinal detachment Father   . Heart attack Father   . Diabetes Father   . Stroke Father   . Hypertension Father   . Glaucoma Paternal Grandfather   . Ovarian cancer Sister     Social history:  reports that she has never smoked. She has never used smokeless tobacco. She reports that she does not drink alcohol or  use drugs.   No Known Allergies  Medications:  Prior to Admission medications   Medication Sig Start Date End Date Taking? Authorizing Provider  acetaminophen (TYLENOL) 325 MG tablet Take 650 mg by mouth every 6 (six) hours as needed for moderate pain.    Yes [provider]  atorvastatin (LIPITOR) 40 MG tablet TAKE ONE TABLET BY MOUTH DAILY AT 6 PM Patient taking differently: Take 40 mg by mouth daily at 6 PM.  12/22/18  Yes Chambliss, Jeb Levering, MD  Calcium Citrate 250 MG TABS Take 2 tablets (500 mg total) by mouth 2 (two) times daily. 01/17/18  Yes Shamleffer, Melanie Crazier, MD  Cholecalciferol (VITAMIN D3) 75 MCG (3000 UT) TABS Take 75 mcg by mouth daily.    Yes [provider]  Continuous Blood Gluc Receiver ([place]) [place] by Does not apply route.   Yes [provider]  Continuous Blood Gluc Sensor (DEXCOM G6 SENSOR) MISC 1 each by Other route daily.    Yes [provider]  fluticasone (FLOVENT HFA) 220 MCG/ACT inhaler Inhale 1 puff into the lungs 2 (two) times daily. 03/20/18  Yes Chambliss, Jeb Levering, MD  glucose blood test strip 1 each by Other route daily. ASCENSIA CONTOUR NEXT ONE: Use to check blood sugar  Yes [provider]  injection device for insulin (INPEN 100-PINK-NOVO) DEVI As directed E10.65 05/03/19  Yes Shamleffer, Melanie Crazier, MD  insulin aspart (NOVOLOG) cartridge Inject 12 Units into the skin daily with breakfast AND 12 Units daily with lunch AND 9 Units daily with supper. Max daily dose of 60 units. 04/19/19  Yes Shamleffer, Melanie Crazier, MD  insulin degludec (TRESIBA FLEXTOUCH) 100 UNIT/ML SOPN FlexTouch Pen Inject 0.16 mLs (16 Units total) into the skin daily. 05/04/19  Yes Shamleffer, Melanie Crazier, MD  Insulin Pen Needle (B-D UF III MINI PEN NEEDLES) 31G X 5 MM MISC Four times a day Patient taking differently: 1 each by Other route 4 (four) times daily.  10/17/18  Yes Shamleffer, Melanie Crazier, MD    Insulin Syringe-Needle U-100 31G X 15/64" 0.5 ML MISC 1 Device by Does not apply route daily. 04/19/19  Yes Shamleffer, Melanie Crazier, MD  MICROLET LANCETS MISC Twice a day Patient taking differently: 1 each by Other route 2 (two) times daily.  02/07/18  Yes Shamleffer, Melanie Crazier, MD  Multiple Vitamin (MULTIVITAMIN) capsule Take 4 capsules by mouth daily. Bariatric fusion    Yes [provider]  mycophenolate (CELLCEPT) 500 MG tablet TAKE TWO TABLETS BY MOUTH TWICE A DAY 04/10/19  Yes Kathrynn Ducking, MD  oxybutynin (DITROPAN) 5 MG tablet TAKE ONE TABLET BY MOUTH TWICE A DAY 04/16/19  Yes Chambliss, Jeb Levering, MD  sertraline (ZOLOFT) 50 MG tablet Take 1 tablet (50 mg total) by mouth daily. 04/16/19  Yes Chambliss, Jeb Levering, MD  tiZANidine (ZANAFLEX) 2 MG tablet Take 1-2 tablets (2-4 mg total) by mouth at bedtime as needed for muscle spasms. 04/03/19  Yes Chambliss, Jeb Levering, MD    ROS:  Out of a complete 14 system review of symptoms, the patient complains only of the following symptoms, and all other reviewed systems are negative.  Walking difficulty Swallowing problems  Blood pressure (!) 159/74, pulse 69.  Physical Exam  General: The patient is alert and cooperative at the time of the examination.  Skin: No significant peripheral edema is noted.   Neurologic Exam  Mental status: The patient is alert and oriented x 3 at the time of the examination. The patient has apparent normal recent and remote memory, with an apparently normal attention span and concentration ability.   Cranial nerves: Facial symmetry is present. Speech is normal, no aphasia or dysarthria is noted. Extraocular movements are full. Visual fields are full.  Motor: The patient has good strength in all 4 extremities.  Sensory examination: Soft touch sensation is symmetric on the face, arms, and legs.  Coordination: The patient has mild dysmetria with finger-nose-finger bilaterally and dysmetria  with heel-to-shin bilaterally.  Gait and station: The patient has the ability to stand, she can only maintain upright posture with assistance.  She can take a step or 2 with examiner but is very unsteady.  She usually uses a wheelchair for ambulation.  Gait is wide-based.  Reflexes: Deep tendon reflexes are symmetric, but are depressed.   Assessment/Plan:  1.  Cerebellar syndrome associated with anti-GAD antibodies  2.  Gait disorder  The patient will continue the CellCept, she will have blood work done today.  She will follow-up here in 6 months, sooner if needed.  Jill Alexanders MD 05/14/2019 11:10 AM  [facility] [address] [place], [place] [address]  Phone [phone] Fax [phone]

## 2019-05-15 ENCOUNTER — Encounter: Payer: Self-pay | Admitting: Internal Medicine

## 2019-05-15 LAB — COMPREHENSIVE METABOLIC PANEL
ALT: 20 IU/L (ref 0–32)
AST: 21 IU/L (ref 0–40)
Albumin/Globulin Ratio: 2.3 — ABNORMAL HIGH (ref 1.2–2.2)
Albumin: 4.4 g/dL (ref 3.8–4.8)
Alkaline Phosphatase: 70 IU/L (ref 39–117)
BUN/Creatinine Ratio: 30 — ABNORMAL HIGH (ref 12–28)
BUN: 25 mg/dL (ref 8–27)
Bilirubin Total: 0.4 mg/dL (ref 0.0–1.2)
CO2: 21 mmol/L (ref 20–29)
Calcium: 10 mg/dL (ref 8.7–10.3)
Chloride: 105 mmol/L (ref 96–106)
Creatinine, Ser: 0.82 mg/dL (ref 0.57–1.00)
GFR calc Af Amer: 86 mL/min/{1.73_m2} (ref >59)
GFR calc non Af Amer: 75 mL/min/{1.73_m2} (ref >59)
Globulin, Total: 1.9 g/dL (ref 1.5–4.5)
Glucose: 161 mg/dL — ABNORMAL HIGH (ref 65–99)
Potassium: 5.4 mmol/L — ABNORMAL HIGH (ref 3.5–5.2)
Sodium: 141 mmol/L (ref 134–144)
Total Protein: 6.3 g/dL (ref 6.0–8.5)

## 2019-05-15 LAB — CBC WITH DIFFERENTIAL/PLATELET
Basophils Absolute: 0.1 10*3/uL (ref 0.0–0.2)
Basos: 1 %
EOS (ABSOLUTE): 0.1 10*3/uL (ref 0.0–0.4)
Eos: 1 %
Hematocrit: 38.8 % (ref 34.0–46.6)
Hemoglobin: 12.7 g/dL (ref 11.1–15.9)
Immature Grans (Abs): 0 10*3/uL (ref 0.0–0.1)
Immature Granulocytes: 0 %
Lymphocytes Absolute: 1.4 10*3/uL (ref 0.7–3.1)
Lymphs: 18 %
MCH: 29.7 pg (ref 26.6–33.0)
MCHC: 32.7 g/dL (ref 31.5–35.7)
MCV: 91 fL (ref 79–97)
Monocytes Absolute: 0.7 10*3/uL (ref 0.1–0.9)
Monocytes: 9 %
Neutrophils Absolute: 5.5 10*3/uL (ref 1.4–7.0)
Neutrophils: 71 %
Platelets: 261 10*3/uL (ref 150–450)
RBC: 4.27 x10E6/uL (ref 3.77–5.28)
RDW: 13.1 % (ref 11.7–15.4)
WBC: 7.8 10*3/uL (ref 3.4–10.8)

## 2019-05-16 ENCOUNTER — Other Ambulatory Visit: Payer: Self-pay | Admitting: Family Medicine

## 2019-05-17 ENCOUNTER — Encounter: Payer: Medicare Other | Attending: Internal Medicine | Admitting: Dietician

## 2019-05-17 ENCOUNTER — Ambulatory Visit: Payer: Medicare Other | Admitting: Dietician

## 2019-05-17 ENCOUNTER — Other Ambulatory Visit: Payer: Self-pay | Admitting: Internal Medicine

## 2019-05-17 ENCOUNTER — Encounter: Payer: Self-pay | Admitting: Dietician

## 2019-05-17 DIAGNOSIS — E108 Type 1 diabetes mellitus with unspecified complications: Secondary | ICD-10-CM | POA: Diagnosis not present

## 2019-05-17 DIAGNOSIS — E1065 Type 1 diabetes mellitus with hyperglycemia: Secondary | ICD-10-CM | POA: Diagnosis not present

## 2019-05-17 DIAGNOSIS — Z01818 Encounter for other preprocedural examination: Secondary | ICD-10-CM | POA: Diagnosis not present

## 2019-05-17 DIAGNOSIS — IMO0002 Reserved for concepts with insufficient information to code with codable children: Secondary | ICD-10-CM

## 2019-05-17 MED ORDER — TRESIBA FLEXTOUCH 100 UNIT/ML ~~LOC~~ SOPN: 18.0000 [IU] | PEN_INJECTOR | Freq: Every day | SUBCUTANEOUS | 3 refills | Status: DC

## 2019-05-17 MED ORDER — INSULIN ASPART 100 UNIT/ML CARTRIDGE (PENFILL): SUBCUTANEOUS | 3 refills | Status: DC

## 2019-05-17 NOTE — Progress Notes (Signed)
Diabetes Self-Management Education  Visit Type: Follow-up  Appt. Start Time: 0915 Appt. End Time: [id number] Visit today was a video visit.  05/17/2019  [name], identified by name and date of birth, is a 69 y.o. female with a diagnosis of Diabetes:  .   ASSESSMENT  She states that she is continuing to cook more of her own food rather than rely on frozen meals. She continues to exercise by wheeling around her apartment complex for 45 minutes most days. She is having her port removed Monday and has her second Covid test on Tuesday. She has an eye exam and a dental appointment at the end of March. Diet is low in fat and overall consistent per diet hx.  Concerns remain about continued elevated blood sugar that begins frequently about 3 hours after breakfast. Last A1C was 8.6% 04/19/19 which has decreased from 9.7% 01/04/20. She reports weight of 169 lbs this week at MD office.  Weight 2 months ago was 167 lbs. She continues to use the Dexcom G-6 (report reviewed). And InPen.  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   Type 1 Diabetes since 2007, asthma, cerebellar ataxia, HTN, HLD, OSA on C-pap and sleep study Julyl 6. Noted swallow evaluation with recommendation of a Dysphagia 3 diet. Dexcom G-6 since 07/11/18.  Inpen recently. Medication includes Lantus 16 units q am and Novolog 12 units before breakfast, lunch and 9 units before dinner. She is to change to [place] when her Lantus runs out.  Discussed with MD.  Long acting insulin to increase to 18 units q am.  Patient lives alone. She uses a wheel chair. She is doing her own grocery shopping. She uses [employer] transpiration for medical appointments but is able to wheel herself to the grocery store. 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 She moved here from [place] last year to be closer to one of her sister's who lives in [place]. The  pool at her apartment is to reopen outdoors. Patient enjoys walking in the pool.  She last saw an RD in [place] about 11 months ago.   Diabetes Self-Management Education - 05/17/19 2031      Visit Information   Visit Type  Follow-up      Psychosocial Assessment   Patient Belief/Attitude about Diabetes  Motivated to manage diabetes    Other persons present  Patient    Patient Concerns  Nutrition/Meal planning;Glycemic Control    Learning Readiness  Ready      Pre-Education Assessment   Patient understands the diabetes disease and treatment process.  Demonstrates understanding / competency    Patient understands incorporating nutritional management into lifestyle.  Needs Review    Patient undertands incorporating physical activity into lifestyle.  Demonstrates understanding / competency    Patient understands using medications safely.  Demonstrates understanding / competency    Patient understands monitoring blood glucose, interpreting and using results  Demonstrates understanding / competency    Patient understands prevention, detection, and treatment of acute complications.  Demonstrates understanding / competency    Patient understands prevention, detection, and treatment of chronic complications.  Demonstrates understanding / competency    Patient understands how to develop strategies to address psychosocial issues.  Demonstrates understanding / competency    Patient understands how to develop strategies to promote health/change behavior.  Demonstrates understanding / competency      Complications   Last HgB A1C per patient/outside source  8.6 %  How often do you check your blood sugar?  > 4 times/day    Fasting Blood glucose range (mg/dL)  >200    Postprandial Blood glucose range (mg/dL)  >200    Number of hypoglycemic episodes per month  0      Dietary Intake   Breakfast  Boost Glucose Control, cheerios OR egg, Multigrain toast, 2 clementines    Snack (morning)   denies    Lunch  15 bean soup, unsalted top saltines, apple    Snack (afternoon)  denies    Dinner  Frozen protein bowl, 1 clementine    Snack (evening)  denies    Beverage(s)  water      Exercise   Exercise Type  Light (walking / raking leaves)   wheels around her apartment complex most days   How many days per week to you exercise?  6    How many minutes per day do you exercise?  45    Total minutes per week of exercise  270      Patient Education   Previous Diabetes Education  Yes (please comment)   03/2019   Disease state   Explored patient's options for treatment of their diabetes    Nutrition management   Meal options for control of blood glucose level and chronic complications.    Physical activity and exercise   Other (comment)   en ouraged to continue   Medications  Reviewed patients medication for diabetes, action, purpose, timing of dose and side effects.    Monitoring  Other (comment)   interpretation   Acute complications  Taught treatment of hypoglycemia - the 15 rule.;Discussed and identified patients' treatment of hyperglycemia.      Individualized Goals (developed by patient)   Nutrition  General guidelines for healthy choices and portions discussed    Physical Activity  Exercise 5-7 days per week;45 minutes per day    Medications  take my medication as prescribed    Reducing Risk  examine blood glucose patterns    Health Coping  discuss diabetes with (comment)   MD, RD, CDE     Patient Self-Evaluation of Goals - Patient rates self as meeting previously set goals (% of time)   Nutrition  >75%    Physical Activity  >75%    Medications  >75%    Monitoring  >75%    Problem Solving  50 - 75 %    Reducing Risk  50 - 75 %    Health Coping  >75%      Post-Education Assessment   Patient understands the diabetes disease and treatment process.  Demonstrates understanding / competency    Patient understands incorporating nutritional management into lifestyle.  Needs  Review    Patient undertands incorporating physical activity into lifestyle.  Demonstrates understanding / competency    Patient understands using medications safely.  Needs Review    Patient understands monitoring blood glucose, interpreting and using results  Needs Review    Patient understands prevention, detection, and treatment of acute complications.  Demonstrates understanding / competency    Patient understands prevention, detection, and treatment of chronic complications.  Demonstrates understanding / competency    Patient understands how to develop strategies to address psychosocial issues.  Demonstrates understanding / competency    Patient understands how to develop strategies to promote health/change behavior.  Demonstrates understanding / competency      Outcomes   Expected Outcomes  Demonstrated interest in learning. Expect positive outcomes    Future  DMSE  4-6 wks    Program Status  Not Completed      Subsequent Visit   Since your last visit have you continued or begun to take your medications as prescribed?  Yes       Individualized Plan for Diabetes Self-Management Training:   Learning Objective:  Patient will have a greater understanding of diabetes self-management. Patient education plan is to attend individual and/or group sessions per assessed needs and concerns.   Plan:   There are no Patient Instructions on file for this visit.  Expected Outcomes:  Demonstrated interest in learning. Expect positive outcomes  Education material provided:   If problems or questions, patient to contact team via:  Phone  Future DSME appointment: 4-6 wks

## 2019-05-21 ENCOUNTER — Encounter: Payer: Self-pay | Admitting: Internal Medicine

## 2019-05-21 ENCOUNTER — Encounter: Payer: Self-pay | Admitting: Family Medicine

## 2019-05-21 DIAGNOSIS — Z452 Encounter for adjustment and management of vascular access device: Secondary | ICD-10-CM | POA: Diagnosis not present

## 2019-05-22 ENCOUNTER — Ambulatory Visit: Payer: Medicare Other | Attending: Internal Medicine

## 2019-05-22 DIAGNOSIS — Z23 Encounter for immunization: Secondary | ICD-10-CM | POA: Insufficient documentation

## 2019-05-22 NOTE — Progress Notes (Signed)
   Covid-19 Vaccination Clinic  Name:  Asha Levendusky    MRN: [id number] DOB: 1950-12-12  05/22/2019  Ms. Glogowski was observed post Covid-19 immunization for 15 minutes without incident. She was provided with Vaccine Information Sheet and instruction to access the V-Safe system.   Ms. Vanwye was instructed to call 911 with any severe reactions post vaccine: Marland Kitchen Difficulty breathing  . Swelling of face and throat  . A fast heartbeat  . A bad rash all over body  . Dizziness and weakness   Immunizations Administered    Name Date Dose VIS Date Route   Pfizer COVID-19 Vaccine 05/22/2019  8:41 AM 0.3 mL 02/23/2019 Intramuscular   Manufacturer: [place]   Lot: [id number]   [place]: [id number]

## 2019-05-25 ENCOUNTER — Encounter: Payer: Self-pay | Admitting: Internal Medicine

## 2019-05-28 ENCOUNTER — Telehealth: Payer: Self-pay | Admitting: Dietician

## 2019-05-28 NOTE — Telephone Encounter (Signed)
Hi Mickel Baas, Dr Kelton Pillar changed my Lantus to 18 units and my Novolog is 10/12/9 with a minimal numbers of units just before bed (it doesn't seem to happen every night and varies between 1 - 2.5 units). Dr. Kelton Pillar had me send her a report that covered the day I had my port removed (Monday 3/8) so she could see what my readins did that day. I got my second Covid shot Tuesday morning and have been pretty comfortable considering the two things. I've been interested that my readings haven't been as wacky over the past few days. I've also found that Multigrain toast with peanut butter and a sliced banana on it seems to be carrying me through the morning and doesn't give me the spikes. Jeani Hawking

## 2019-05-29 ENCOUNTER — Encounter: Payer: Self-pay | Admitting: Internal Medicine

## 2019-05-29 ENCOUNTER — Encounter: Payer: Self-pay | Admitting: Family Medicine

## 2019-05-29 DIAGNOSIS — H25013 Cortical age-related cataract, bilateral: Secondary | ICD-10-CM | POA: Diagnosis not present

## 2019-05-29 DIAGNOSIS — H40013 Open angle with borderline findings, low risk, bilateral: Secondary | ICD-10-CM | POA: Diagnosis not present

## 2019-05-29 DIAGNOSIS — H2513 Age-related nuclear cataract, bilateral: Secondary | ICD-10-CM | POA: Diagnosis not present

## 2019-05-29 DIAGNOSIS — E119 Type 2 diabetes mellitus without complications: Secondary | ICD-10-CM | POA: Diagnosis not present

## 2019-05-29 LAB — HM DIABETES EYE EXAM

## 2019-06-07 ENCOUNTER — Telehealth: Payer: Self-pay

## 2019-06-07 NOTE — Telephone Encounter (Signed)
Received phone message from nurse line from [facility] requesting office to re-fax OV notes from 04/19/2019. I have 2 confirmation pages with " [name]" so looks as if fax did not go thru. I have this issue with [facility] repeatedly and have left vm's  asking for a working fax #. I will re-send paperwork today and file confirmation along with previous attempts.

## 2019-06-12 ENCOUNTER — Encounter: Payer: Self-pay | Admitting: Internal Medicine

## 2019-06-14 ENCOUNTER — Other Ambulatory Visit: Payer: Self-pay | Admitting: Endocrinology

## 2019-06-14 ENCOUNTER — Telehealth: Payer: Self-pay | Admitting: Dietician

## 2019-06-14 DIAGNOSIS — E1065 Type 1 diabetes mellitus with hyperglycemia: Secondary | ICD-10-CM

## 2019-06-14 NOTE — Telephone Encounter (Signed)
Called patient related to her appointment tomorrow. This will be a phone visit. Instructed her to fax her InPen report to [place] office at [phone].  Antonieta Iba, RD, LDN, CDE

## 2019-06-15 ENCOUNTER — Encounter: Payer: Medicare Other | Attending: Internal Medicine | Admitting: Dietician

## 2019-06-15 ENCOUNTER — Other Ambulatory Visit: Payer: Self-pay

## 2019-06-15 DIAGNOSIS — E108 Type 1 diabetes mellitus with unspecified complications: Secondary | ICD-10-CM | POA: Diagnosis not present

## 2019-06-15 DIAGNOSIS — E1065 Type 1 diabetes mellitus with hyperglycemia: Secondary | ICD-10-CM | POA: Diagnosis not present

## 2019-06-15 DIAGNOSIS — IMO0002 Reserved for concepts with insufficient information to code with codable children: Secondary | ICD-10-CM

## 2019-06-15 NOTE — Patient Instructions (Signed)
Continue consistent 3 meals per day Aim for 3 car choices or about 45 grams of carbohydrates per meal Snack if hungry on raw or steamed vegetables Continue to stay active Enjoy life and keep stress low

## 2019-06-22 ENCOUNTER — Encounter: Payer: Self-pay | Admitting: Dietician

## 2019-06-22 NOTE — Progress Notes (Signed)
Diabetes Self-Management Education  Visit Type: Follow-up  Appt. Start Time: 0900 Appt. End Time: 0930  06/22/2019  [name], identified by name and date of birth, is a 69 y.o. female with a diagnosis of Diabetes:  .   ASSESSMENT Patient is here today alone.   She has brought inpen report for the previous 2 weeks.  Average glucose 214 She had her IV port removed on 05/21/2019 She had her first and seecond covid vaccine 04/29/19 and 05/22/19 She changed from Lantus to [place] 05/25/19 She states that she likes this new insulin and that thinks this works better to control her blood sugar. She states that she is noticing the effects of different foods more on her BG and that the darker bread is better and the toast with peanut butter is better than the cereal and controlling her blood sugar. She also verbalized that she over corrects low blood sugar.  She has not had problems with this since changing to [place]. She also had maintenance on her wheel chair and was not as active during part of this time which increased her blood glucose.  She continues to use the Dexcom G-6 (report reviewed). And InPen.  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   Type 1 Diabetes since 2007, asthma, cerebellar ataxia, HTN, HLD, OSA on C-pap and sleep study Julyl 6. Noted swallow evaluation with recommendation of a Dysphagia 3 diet. Dexcom G-6 since 07/11/18.  Inpen recently. Medication includes Lantus 16 units q am and Novolog 12 units before breakfast, lunch and 9 units before dinner. She is to change to [place] when her Lantus runs out.  Discussed with MD.  Long acting insulin to increase to 18 units q am.  Patient lives alone. She uses a wheel chair. She is doing her own grocery shopping. She uses [employer] transpiration for medical appointments but is able to wheel herself to the grocery store. 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 She moved here from [place] last year to be closer to one of her sister's who lives in [place]. The pool at her apartment is to reopen outdoors. Patient enjoys walking in the pool.  She last saw an RD in [place] about 11 months ago.  Diabetes Self-Management Education - 06/22/19 1918      Visit Information   Visit Type  Follow-up      Pre-Education Assessment   Patient understands the diabetes disease and treatment process.  Demonstrates understanding / competency    Patient understands incorporating nutritional management into lifestyle.  Needs Review    Patient undertands incorporating physical activity into lifestyle.  Needs Review    Patient understands using medications safely.  Demonstrates understanding / competency    Patient understands monitoring blood glucose, interpreting and using results  Demonstrates understanding / competency    Patient understands prevention, detection, and treatment of acute complications.  Demonstrates understanding / competency    Patient understands prevention, detection, and treatment of chronic complications.  Demonstrates understanding / competency    Patient understands how to develop strategies to address psychosocial issues.  Demonstrates understanding / competency    Patient understands how to develop strategies to promote health/change behavior.  Needs Review      Complications   How often do you check your blood sugar?  > 4 times/day      Dietary Intake   Breakfast  2 slices dark whole grain toast with peanut butter and 1  clmentine or 1 banana OR museli AND coffee with 3 pmps SF flavor and 2 T half and half    Lunch  rotissarie chicken or wrap with lettuce and hummus    Snack (afternoon)  1/2 cup sugar free pudding    Dinner  steamed asparagus, broccoli, rotisary chicken, small yucon gold potato    Snack (evening)  saltines    Beverage(s)  water, coffee      Exercise   Exercise Type  Light (walking  / raking leaves)   rolls herself around her complex in wheel chair daily     Patient Education   Previous Diabetes Education  Yes (please comment)   04/2019   Nutrition management   Meal options for control of blood glucose level and chronic complications.    Psychosocial adjustment  Worked with patient to identify barriers to care and solutions      Individualized Goals (developed by patient)   Nutrition  General guidelines for healthy choices and portions discussed    Physical Activity  Exercise 5-7 days per week;30 minutes per day    Medications  take my medication as prescribed    Monitoring   test my blood glucose as discussed    Reducing Risk  examine blood glucose patterns;increase portions of healthy fats    Health Coping  discuss diabetes with (comment)   MD, RD, CDE     Patient Self-Evaluation of Goals - Patient rates self as meeting previously set goals (% of time)   Nutrition  >75%    Physical Activity  >75%    Medications  >75%    Monitoring  >75%    Problem Solving  >75%    Reducing Risk  >75%    Health Coping  >75%      Post-Education Assessment   Patient understands the diabetes disease and treatment process.  Demonstrates understanding / competency    Patient understands incorporating nutritional management into lifestyle.  Needs Review    Patient undertands incorporating physical activity into lifestyle.  Demonstrates understanding / competency    Patient understands using medications safely.  Demonstrates understanding / competency    Patient understands monitoring blood glucose, interpreting and using results  Demonstrates understanding / competency    Patient understands prevention, detection, and treatment of acute complications.  Demonstrates understanding / competency    Patient understands prevention, detection, and treatment of chronic complications.  Demonstrates understanding / competency    Patient understands how to develop strategies to address  psychosocial issues.  Demonstrates understanding / competency    Patient understands how to develop strategies to promote health/change behavior.  Needs Review      Outcomes   Expected Outcomes  Demonstrated interest in learning. Expect positive outcomes    Future DMSE  2 months    Program Status  Not Completed      Subsequent Visit   Since your last visit have you continued or begun to take your medications as prescribed?  Yes    Since your last visit, are you checking your blood glucose at least once a day?  Yes       Individualized Plan for Diabetes Self-Management Training:   Learning Objective:  Patient will have a greater understanding of diabetes self-management. Patient education plan is to attend individual and/or group sessions per assessed needs and concerns.   Plan:   Patient Instructions  Continue consistent 3 meals per day Aim for 3 car choices or about 45 grams of carbohydrates per meal Snack  if hungry on raw or steamed vegetables Continue to stay active Enjoy life and keep stress low   Expected Outcomes:  Demonstrated interest in learning. Expect positive outcomes  If problems or questions, patient to contact team via:  Phone or email  Future DSME appointment: 2 months

## 2019-06-27 ENCOUNTER — Ambulatory Visit (INDEPENDENT_AMBULATORY_CARE_PROVIDER_SITE_OTHER): Payer: Medicare Other | Admitting: Family Medicine

## 2019-06-27 ENCOUNTER — Other Ambulatory Visit: Payer: Self-pay

## 2019-06-27 ENCOUNTER — Encounter: Payer: Self-pay | Admitting: Family Medicine

## 2019-06-27 DIAGNOSIS — J4540 Moderate persistent asthma, uncomplicated: Secondary | ICD-10-CM

## 2019-06-27 DIAGNOSIS — F329 Major depressive disorder, single episode, unspecified: Secondary | ICD-10-CM

## 2019-06-27 DIAGNOSIS — H9313 Tinnitus, bilateral: Secondary | ICD-10-CM | POA: Diagnosis not present

## 2019-06-27 DIAGNOSIS — H9319 Tinnitus, unspecified ear: Secondary | ICD-10-CM | POA: Insufficient documentation

## 2019-06-27 NOTE — Patient Instructions (Addendum)
Good to see you today!  Thanks for coming in.  For the depression  Take 1/2 of sertraline 50 mg tab for one month if doing well then stop  For the asthma  Decrease to 1 puff once a day for one month and if no problems then stop  For the tinnitus  Should hear from audiology in 2 weeks if not call me  I will look into the colonscopy

## 2019-06-27 NOTE — Progress Notes (Signed)
    SUBJECTIVE:   CHIEF COMPLAINT / HPI:   TINNITUS  Hissing in her ears for last 3 months.  Was intermittent now constant for last 10 days.  Hard to fall back asleep when awakens.  Had tonsillectomy in 1960.  She uses a noise generator.  No ear pain or discharge or problems with allergies   DEPRESSION Feeling very well on lowered dose of sertraline  ASTHMA Hasn't used rescue inhaler for a year.  Ok with weaning controller  PERTINENT  PMH / PSH: blood sugar readings have been erractic working with her endocrinologist  OBJECTIVE:   BP (!) 168/80   Pulse 83   Ht 5\' 10"  (1.778 m)   Wt 179 lb 3.2 oz (81.3 kg)   SpO2 99%   BMI 25.71 kg/m   Psych:  Cognition and judgment appear intact. Alert, communicative  and cooperative with normal attention span and concentration. No apparent delusions, illusions, hallucinations Ears:  External ear exam shows no significant lesions or deformities.  Otoscopic examination reveals clear canals, tympanic membranes are intact bilaterally without bulging, retraction, inflammation or discharge. Hearing is grossly normal bilaterall    ASSESSMENT/PLAN:   Asthma Stable will wean controller - see after visit summary   Depression Doing well.  Wean off sertraline - see after visit summary   Tinnitus New diagnosis - uncertain prognosis - will refer for audiology.  No visible ear pathology. May eventually need ENT     Lind Covert, MD [place]

## 2019-06-28 NOTE — Assessment & Plan Note (Signed)
New diagnosis - uncertain prognosis - will refer for audiology.  No visible ear pathology. May eventually need ENT

## 2019-06-28 NOTE — Assessment & Plan Note (Signed)
Stable will wean controller - see after visit summary

## 2019-06-28 NOTE — Assessment & Plan Note (Signed)
Doing well.  Wean off sertraline - see after visit summary

## 2019-06-28 NOTE — Assessment & Plan Note (Signed)
>>  ASSESSMENT AND PLAN FOR ASTHMA WRITTEN ON 06/28/2019  8:43 AM BY Millena Callins L, MD  Stable will wean controller - see after visit summary

## 2019-07-03 ENCOUNTER — Encounter: Payer: Self-pay | Admitting: Internal Medicine

## 2019-07-03 ENCOUNTER — Encounter: Payer: Self-pay | Admitting: Family Medicine

## 2019-07-08 ENCOUNTER — Other Ambulatory Visit: Payer: Self-pay | Admitting: Neurology

## 2019-07-09 ENCOUNTER — Other Ambulatory Visit: Payer: Self-pay

## 2019-07-09 MED ORDER — MYCOPHENOLATE MOFETIL 500 MG PO TABS: 1000.0000 mg | ORAL_TABLET | Freq: Two times a day (BID) | ORAL | 3 refills | Status: DC

## 2019-07-11 ENCOUNTER — Other Ambulatory Visit: Payer: Self-pay | Admitting: Family Medicine

## 2019-07-12 ENCOUNTER — Ambulatory Visit: Payer: Medicare Other | Attending: Audiology | Admitting: Audiology

## 2019-07-12 ENCOUNTER — Other Ambulatory Visit: Payer: Self-pay

## 2019-07-12 DIAGNOSIS — H903 Sensorineural hearing loss, bilateral: Secondary | ICD-10-CM | POA: Diagnosis not present

## 2019-07-12 NOTE — Procedures (Signed)
  Outpatient Audiology and [place], [place]  [id number] [phone]  AUDIOLOGICAL  EVALUATION  NAME: Joyce Brown     DOB:   [birth year]    MRN: [id number]                                                                       DATE: 07/12/2019     STATUS: Outpatient REFERENT: Joyce Covert, MD DIAGNOSIS: H90.3 Sensorineural loss AU  History: Tinnitus History:  Joyce was seen today for an audiological evaluation due to tinnitus. [name] has tinnitus that onset 3 months ago and has been getting worse over time. She describes the tinnitus as a hissing, like that of a radiator.  Tinnitus is present and equal in both ears. The sound is in both ears and is so loud at night that when she wakes up she cannot fall back asleep. She also feels she is not hearing as well especially in noise. Medical History:  [name] has diabetes. She has changed medication recently, but the tinnitus started before this. She reports her diabetes to be well controlled. 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 She has not had any ear or sinus infections recently. [name] denies any recent changes in stress levels.   Evaluation:   Otoscopy showed significant cerumen build up in both ears. Removal is recommended.    Tympanometry results were consistent with normal middle ear function. Type A tympanograms show sound is bypassing the cerumen build up bilaterally.  Audiometric testing was completed using conventional audiometry with headphones. 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 Asymmetry noted at 2,000 Hz with the right ear worse.   Results:  The test results were reviewed with Joyce Brown. We discussed at length different methods for tinnitus masking and management. She is currently using a rain sound on her iPhone. We dicussed the need for something louder and more steady state, such a white noise or pink noise machine  that can be raised to a higher volume than a phone. Due to asymmetry in her hearing, cerumen build up, and progressive tinnitus a medical evaluation is necessary. Referral to ENT Physician needed.    Recommendations: 1.   Referral to an Ear Nose and Throat Physician necessary to ENT of PCP's choice.   - Our closest ENT is [facility] on [address] [phone].  2.   Use of a louder masking device and or creation of a more steady state masking sound. For custom masking sound I recommend the MyNoise App.    Alfonse Alpers Audiologist, Au.D., CCC-A 07/12/2019  2:26 PM  Cc: Joyce Covert, MD

## 2019-07-14 ENCOUNTER — Encounter: Payer: Self-pay | Admitting: Family Medicine

## 2019-07-14 DIAGNOSIS — H9313 Tinnitus, bilateral: Secondary | ICD-10-CM

## 2019-07-17 ENCOUNTER — Other Ambulatory Visit: Payer: Self-pay | Admitting: Family Medicine

## 2019-07-17 ENCOUNTER — Other Ambulatory Visit: Payer: Self-pay

## 2019-07-17 DIAGNOSIS — Z1231 Encounter for screening mammogram for malignant neoplasm of breast: Secondary | ICD-10-CM

## 2019-07-19 ENCOUNTER — Ambulatory Visit (INDEPENDENT_AMBULATORY_CARE_PROVIDER_SITE_OTHER): Payer: Medicare Other | Admitting: Internal Medicine

## 2019-07-19 ENCOUNTER — Other Ambulatory Visit: Payer: Self-pay

## 2019-07-19 ENCOUNTER — Encounter: Payer: Self-pay | Admitting: Internal Medicine

## 2019-07-19 VITALS — BP 134/68 | HR 78 | Temp 98.2°F | Ht 70.0 in | Wt 181.2 lb

## 2019-07-19 DIAGNOSIS — E1065 Type 1 diabetes mellitus with hyperglycemia: Secondary | ICD-10-CM | POA: Diagnosis not present

## 2019-07-19 LAB — POCT GLYCOSYLATED HEMOGLOBIN (HGB A1C): Hemoglobin A1C: 7.5 % — AB (ref 4.0–5.6)

## 2019-07-19 NOTE — Progress Notes (Signed)
Name: Joyce Brown  Age/ Sex: 69 y.o., female   MRN/ DOB: [id number], 1950/05/20     PCP: Lind Covert, MD   Reason for Endocrinology Evaluation: Type 1 Diabetes Mellitus  Initial Endocrine Consultative Visit: 01/17/2018    PATIENT IDENTIFIER: Joyce Brown is a 69 y.o. female with a past medical history of T1DM, cerebellar atrophy, asthma, OSA on CPAP and Hx of gastric bypass in 2014  . The patient has followed with Endocrinology clinic since 01/17/18 for consultative assistance with management of her diabetes.  Patient moved from [place] in September, 2019 to be close to her sister. She has been diagnosed with stiff person Syndrome but Dr. Jannifer Franklin thought this may be more of cerebellar atrophy. She was on  IVIG until 11/07/2018. 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 She has only had one DKA episode which was in 11/2017. Marland Kitchen Her hemoglobin A1c has ranged from 8.4% in 01/2017, peaking at 12.1 % in 01/2016  She is followed by Dr. 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 She has been off IVIG since 02/2018. He has been tapering her diazepam down and is currently on cellcept.    Started using the inpen 07/2018  SUBJECTIVE:   During the last visit (04/18/2018): A1c 8.6 % . We adjusted MDI regimen     Today (07/19/2019): Joyce Brown is here for a 3 months follow up visit on diabetes.  She checks her blood sugars multiple times daily, using  CGM (Dexcom). The patient has had hypoglycemic episodes since the last clinic visit, which typically occur multiple 3x / week- most often occuring after a bolus. The patient is symptomatic with these episodes. Otherwise, the patient has not required any recent emergency interventions for hypoglycemia and hasnot had recent hospitalizations secondary to hyper or  hypoglycemic episodes.     ROS: As per HPI and as detailed below: Review of Systems  Respiratory: Negative for cough and shortness of breath.   Cardiovascular: Negative for chest pain and palpitations.  Gastrointestinal: Negative for diarrhea and nausea.  Endo/Heme/Allergies: Negative for polydipsia.      HOME DIABETES REGIMEN:  Tresiba 17 units QHS Novolog 10 units  With Breakfast, 12 units with lunch and 9 units with supper  CF (BG - 110/55)    CONTINUOUS GLUCOSE MONITORING RECORD INTERPRETATION    Dates of Recording:4/23-07/19/2019 Sensor description:Dexcom  Results statistics:   CGM use % of time 93  Average and SD 192  Time in range  46 %  % Time Above 180 33  % Time above 250 19  % Time Below target <1    Glycemic patterns summary: Hyperglycemia during the day , overnight  Stable  Hyperglycemic episodes  During the day   Hypoglycemic episodes occurred postprandial   Overnight periods: steady   IN-Pen report  4/22- 07/18/2019  Average 194 Missed doses:  3 SD 74 TDD 53  HISTORY:  Past Medical History:  Past Medical History:  Diagnosis Date  . Asthma   . Cerebellar ataxia ([place])   . Diabetes mellitus without complication ([place])   . Gait abnormality 11/07/2018  . Glaucoma   . Hyperlipidemia   . Hypertension   . Pulmonary embolism ([place])   . Sleep apnea   . Spinal stenosis    Past Surgical History:  Past Surgical History:  Procedure  Laterality Date  . ABDOMINAL HYSTERECTOMY    . ADENOIDECTOMY    . ANKLE SURGERY Left   . BILATERAL CARPAL TUNNEL RELEASE    . ELBOW SURGERY    . GASTRIC BYPASS  2014  . HAND NEUROPLASTY    . TONSILLECTOMY      Social History:  reports that she has never smoked. She has never used smokeless tobacco. She reports that she does not drink alcohol or use drugs. Family History:  Family History  Problem Relation Age of Onset  . Cervical cancer Mother   . Ovarian cancer Mother   . Dementia Father   . Retinal detachment  Father   . Heart attack Father   . Diabetes Father   . Stroke Father   . Hypertension Father   . Glaucoma Paternal Grandfather   . Ovarian cancer Sister      HOME MEDICATIONS: Allergies as of 07/19/2019   No Known Allergies     Medication List       Accurate as of Jul 19, 2019 12:24 PM. If you have any questions, ask your nurse or doctor.        acetaminophen 325 MG tablet Commonly known as: TYLENOL Take 650 mg by mouth every 6 (six) hours as needed for moderate pain.   atorvastatin 40 MG tablet Commonly known as: LIPITOR TAKE ONE TABLET BY MOUTH DAILY AT 6 PM What changed: See the new instructions.   B-D UF III MINI PEN NEEDLES 31G X 5 MM Misc Generic drug: Insulin Pen Needle Four times a day What changed:   how much to take  how to take this  when to take this  additional instructions   Calcium Citrate 250 MG Tabs Take 2 tablets (500 mg total) by mouth 2 (two) times daily.   Dexcom G6 Receiver Devi by Does not apply route.   Dexcom G6 Sensor Misc 1 each by Other route daily.   Flovent HFA 220 MCG/ACT inhaler Generic drug: fluticasone INHALE 1 PUFF INTO THE LUNGS TWO TIMES A DAY   glucose blood test strip 1 each by Other route daily. ASCENSIA CONTOUR NEXT ONE: Use to check blood sugar   InPen 100-Pink-Novo Devi Generic drug: injection device for insulin As directed E10.65   insulin aspart cartridge Commonly known as: NOVOLOG Inject 10 Units into the skin daily with breakfast AND 12 Units daily with lunch AND 9 Units daily with supper. Max daily dose of 60 units.   Insulin Syringe-Needle U-100 31G X 15/64" 0.5 ML Misc 1 Device by Does not apply route daily.   Microlet Lancets Misc Twice a day What changed:   how much to take  how to take this  when to take this  additional instructions   multivitamin capsule Take 4 capsules by mouth daily. Bariatric fusion   mycophenolate 500 MG tablet Commonly known as: CELLCEPT Take 2 tablets  (1,000 mg total) by mouth 2 (two) times daily.   oxybutynin 5 MG tablet Commonly known as: DITROPAN TAKE ONE TABLET BY MOUTH TWICE A DAY   sertraline 50 MG tablet Commonly known as: ZOLOFT Take 1 tablet (50 mg total) by mouth daily. What changed:   how much to take  additional instructions   tiZANidine 2 MG tablet Commonly known as: ZANAFLEX Take 1-2 tablets (2-4 mg total) by mouth at bedtime as needed for muscle spasms.   Tyler Aas FlexTouch 100 UNIT/ML FlexTouch Pen Generic drug: insulin degludec Inject 0.18 mLs (18 Units total) into the skin daily.  What changed: how much to take   Vitamin D3 75 MCG (3000 UT) Tabs Take 75 mcg by mouth daily.       PHYSICAL EXAM: VS: BP 134/68 (BP Location: Right Arm, Patient Position: Sitting, Cuff Size: Normal)   Pulse 78   Temp 98.2 F (36.8 C)   Ht 5\' 10"  (1.778 m)   Wt 181 lb 3.2 oz (82.2 kg)   SpO2 98%   BMI 26.00 kg/m    EXAM: General: Pt appears well and is in NAD  Lungs: Clear with good BS bilat with no rales, rhonchi, or wheezes  Heart: Auscultation: RRR with normal S1 and S2, no gallops or murmurs  Extremities:  BL LE: no pretibial edema   Mental Status: Judgment, insight: intact Mood and affect: no depression, anxiety, or agitation   DM Foot Exam 04/19/2019 The skin of the feet is intact without sores or ulcerations. The pedal pulses are 2+ on right and 2+ on left. The sensation is intact to a screening 5.07, 10 gram monofilament bilaterally   DATA REVIEWED:  Lab Results  Component Value Date   HGBA1C 7.5 (A) 07/19/2019   HGBA1C 8.6 (A) 04/19/2019   HGBA1C 9.7 (A) 01/04/2019   Lab Results  Component Value Date   MICROALBUR 3.1 (H) 04/19/2019   LDLCALC 102 (H) 01/17/2019   CREATININE 0.82 05/14/2019       ASSESSMENT / PLAN / RECOMMENDATIONS:   1) Type 1 Diabetes Mellitus, Improved Glycemic Control- Most recent A1c of 7.5 %. Goal A1c < 7.5 %.     - Improved glycemic control  - Pt is sensitive to  insulin and carbohydrate which is not unusual in pt with autoimmune diabetes, but also makes it a bit challenging to reach optimal glucose readings, another contributing factor is her hx of gastric bypass which causes variability in absorption . - In review of the in-pen and the Dexcom, her glucose readings has improved overnight, but she continues with fluctuating BG's during the day    MEDICATIONS:  Continue Tresiba 17 units daily   Continue Novolog 10 units with breakfast, 12 units with lunch  , and 9 units with  supper   Change Sensitivity factor  To 60    EDUCATION / INSTRUCTIONS:  BG monitoring instructions: Patient is instructed to check her blood sugars 4 times a day with CGM caliberation.  Call [place] [facility] if: BG persistently < 70 or > 300. . I reviewed the Rule of 15 for the treatment of hypoglycemia in detail with the patient.     2) Diabetic complications:   Eye: Does not have known diabetic retinopathy.   Neuro/ Feet: Does not have known diabetic peripheral neuropathy.  Renal: Patient does not have known baseline CKD. She is not on an ACEI/ARB at present. Normal microalbuminuria .    F/U in 3 months    Signed electronically by: Mack Guise, MD  [facility]  [facility] [address] [place], [place] [address] Phone: [phone] FAX: [phone]   CC: Lind Covert, [place] [address] Phone: [phone]  Fax: [phone]  Return to Endocrinology clinic as below: Future Appointments  Date Time Provider [place]  07/27/2019  8:30 AM Marzetta Board, DPM [facility]  08/17/2019  9:00 AM Clydell Hakim, RD [place] [facility]  08/31/2019 10:00 AM [facility] MM 2 [facility]  10/19/2019  8:30 AM Liticia Gasior, Melanie Crazier, MD [facility] None  11/14/2019  10:00 AM Kathrynn Ducking, MD [facility] None

## 2019-07-20 ENCOUNTER — Encounter: Payer: Self-pay | Admitting: Internal Medicine

## 2019-07-23 ENCOUNTER — Encounter: Payer: Self-pay | Admitting: Internal Medicine

## 2019-07-25 ENCOUNTER — Ambulatory Visit: Payer: Medicare Other | Admitting: Podiatry

## 2019-07-25 ENCOUNTER — Emergency Department (HOSPITAL_COMMUNITY)
Admission: EM | Admit: 2019-07-25 | Discharge: 2019-07-26 | Disposition: A | Discharge status: Home / Self Care | Payer: Medicare Other | Attending: Emergency Medicine | Admitting: Emergency Medicine

## 2019-07-25 DIAGNOSIS — W268XXA Contact with other sharp object(s), not elsewhere classified, initial encounter: Secondary | ICD-10-CM | POA: Insufficient documentation

## 2019-07-25 DIAGNOSIS — Z79899 Other long term (current) drug therapy: Secondary | ICD-10-CM | POA: Diagnosis not present

## 2019-07-25 DIAGNOSIS — Y999 Unspecified external cause status: Secondary | ICD-10-CM | POA: Insufficient documentation

## 2019-07-25 DIAGNOSIS — I10 Essential (primary) hypertension: Secondary | ICD-10-CM | POA: Diagnosis not present

## 2019-07-25 DIAGNOSIS — S61211A Laceration without foreign body of left index finger without damage to nail, initial encounter: Secondary | ICD-10-CM | POA: Insufficient documentation

## 2019-07-25 DIAGNOSIS — Y939 Activity, unspecified: Secondary | ICD-10-CM | POA: Diagnosis not present

## 2019-07-25 DIAGNOSIS — E109 Type 1 diabetes mellitus without complications: Secondary | ICD-10-CM | POA: Insufficient documentation

## 2019-07-25 DIAGNOSIS — R58 Hemorrhage, not elsewhere classified: Secondary | ICD-10-CM | POA: Diagnosis not present

## 2019-07-25 DIAGNOSIS — Z23 Encounter for immunization: Secondary | ICD-10-CM | POA: Diagnosis not present

## 2019-07-25 DIAGNOSIS — Y92019 Unspecified place in single-family (private) house as the place of occurrence of the external cause: Secondary | ICD-10-CM | POA: Insufficient documentation

## 2019-07-25 DIAGNOSIS — J45909 Unspecified asthma, uncomplicated: Secondary | ICD-10-CM | POA: Insufficient documentation

## 2019-07-25 DIAGNOSIS — Z9884 Bariatric surgery status: Secondary | ICD-10-CM | POA: Diagnosis not present

## 2019-07-25 DIAGNOSIS — R52 Pain, unspecified: Secondary | ICD-10-CM | POA: Diagnosis not present

## 2019-07-25 MED ORDER — LIDOCAINE HCL (PF) 1 % IJ SOLN
5.0000 mL | Freq: Once | INTRAMUSCULAR | Status: AC
  Administered 2019-07-25: 5 mL
  Filled 2019-07-25: qty 30

## 2019-07-25 MED ORDER — TETANUS-DIPHTH-ACELL PERTUSSIS 5-2.5-18.5 LF-MCG/0.5 IM SUSP
0.5000 mL | Freq: Once | INTRAMUSCULAR | Status: AC
  Administered 2019-07-25: 22:00:00 0.5 mL via INTRAMUSCULAR
  Filled 2019-07-25: qty 0.5

## 2019-07-25 NOTE — ED Notes (Signed)
ptar called 

## 2019-07-25 NOTE — ED Provider Notes (Signed)
[facility] EMERGENCY DEPARTMENT Provider Note   CSN: [id number] Arrival date & time: 07/25/19  1730     History Chief Complaint  Patient presents with  . Laceration    Joyce Brown is a 69 y.o. female.  HPI She caught her finger, on a box counter at home.  No other injuries.  She states this was accidental.  They have been trying to break down some boxes.  Last tetanus booster was in 2015.  There are no other known modifying factors.    Past Medical History:  Diagnosis Date  . Asthma   . Cerebellar ataxia ([place])   . Diabetes mellitus without complication ([place])   . Gait abnormality 11/07/2018  . Glaucoma   . Hyperlipidemia   . Hypertension   . Pulmonary embolism ([place])   . Sleep apnea   . Spinal stenosis     Patient Active Problem List   Diagnosis Date Noted  . Tinnitus 06/27/2019  . FH: ovarian cancer in first degree relative 11/16/2018  . Hypercholesteremia 11/15/2018  . Gait abnormality 11/07/2018  . H/O bariatric surgery 10/27/2018  . Throat pain 10/25/2018  . Pain of left hip joint 05/18/2018  . Chronic midline low back pain with bilateral sciatica 05/18/2018  . Depression 03/20/2018  . Breast calcifications 12/28/2017  . Fibromyalgia 12/06/2017  . History of gastric bypass 12/06/2017  . Spinal stenosis 12/06/2017  . Hypokalemia 12/06/2017  . Asthma 12/06/2017  . Cerebellar ataxia ([place])   . Type I diabetes mellitus with complication, uncontrolled ([place])     Past Surgical History:  Procedure Laterality Date  . ABDOMINAL HYSTERECTOMY    . ADENOIDECTOMY    . ANKLE SURGERY Left   . BILATERAL CARPAL TUNNEL RELEASE    . ELBOW SURGERY    . GASTRIC BYPASS  2014  . HAND NEUROPLASTY    . TONSILLECTOMY       OB History   No obstetric history on file.     Family History  Problem Relation Age of Onset  . Cervical cancer Mother   . Ovarian cancer Mother   . Dementia Father   . Retinal detachment Father   . Heart attack Father   .  Diabetes Father   . Stroke Father   . Hypertension Father   . Glaucoma Paternal Grandfather   . Ovarian cancer Sister     Social History   Tobacco Use  . Smoking status: Never Smoker  . Smokeless tobacco: Never Used  Substance Use Topics  . Alcohol use: Never  . Drug use: Never    Home Medications Prior to Admission medications   Medication Sig Start Date End Date Taking? Authorizing Provider  acetaminophen (TYLENOL) 325 MG tablet Take 650 mg by mouth every 6 (six) hours as needed for moderate pain.     [provider]  atorvastatin (LIPITOR) 40 MG tablet TAKE ONE TABLET BY MOUTH DAILY AT 6 PM Patient taking differently: Take 40 mg by mouth daily at 6 PM.  12/22/18   Chambliss, Jeb Levering, MD  Calcium Citrate 250 MG TABS Take 2 tablets (500 mg total) by mouth 2 (two) times daily. 01/17/18   Shamleffer, Melanie Crazier, MD  Cholecalciferol (VITAMIN D3) 75 MCG (3000 UT) TABS Take 75 mcg by mouth daily.     [provider]  Continuous Blood Gluc Receiver ([place]) [place] by Does not apply route.    [provider]  Continuous Blood Gluc Sensor (DEXCOM G6 SENSOR) MISC 1 each  by Other route daily.     [provider]  FLOVENT HFA 220 MCG/ACT inhaler INHALE 1 PUFF INTO THE LUNGS TWO TIMES A DAY 05/16/19   Chambliss, Jeb Levering, MD  glucose blood test strip 1 each by Other route daily. ASCENSIA CONTOUR NEXT ONE: Use to check blood sugar    [provider]  injection device for insulin (INPEN 100-PINK-NOVO) DEVI As directed E10.65 05/03/19   Shamleffer, Melanie Crazier, MD  insulin aspart (NOVOLOG) cartridge Inject 10 Units into the skin daily with breakfast AND 12 Units daily with lunch AND 9 Units daily with supper. Max daily dose of 60 units. 05/17/19   Shamleffer, Melanie Crazier, MD  insulin degludec (TRESIBA FLEXTOUCH) 100 UNIT/ML FlexTouch Pen Inject 0.18 mLs (18 Units total) into the skin daily. Patient taking differently: Inject 17 Units  into the skin daily.  05/17/19   Shamleffer, Melanie Crazier, MD  Insulin Pen Needle (B-D UF III MINI PEN NEEDLES) 31G X 5 MM MISC Four times a day Patient taking differently: 1 each by Other route 4 (four) times daily.  10/17/18   Shamleffer, Melanie Crazier, MD  Insulin Syringe-Needle U-100 31G X 15/64" 0.5 ML MISC 1 Device by Does not apply route daily. 04/19/19   Shamleffer, Melanie Crazier, MD  MICROLET LANCETS MISC Twice a day Patient taking differently: 1 each by Other route 2 (two) times daily.  02/07/18   Shamleffer, Melanie Crazier, MD  Multiple Vitamin (MULTIVITAMIN) capsule Take 4 capsules by mouth daily. Bariatric fusion     [provider]  mycophenolate (CELLCEPT) 500 MG tablet Take 2 tablets (1,000 mg total) by mouth 2 (two) times daily. 07/09/19   Kathrynn Ducking, MD  oxybutynin (DITROPAN) 5 MG tablet TAKE ONE TABLET BY MOUTH TWICE A DAY 07/11/19   Lind Covert, MD  sertraline (ZOLOFT) 50 MG tablet Take 1 tablet (50 mg total) by mouth daily. Patient taking differently: Take 25 mg by mouth daily. Half a tab in a.m. 04/16/19   Lind Covert, MD  tiZANidine (ZANAFLEX) 2 MG tablet Take 1-2 tablets (2-4 mg total) by mouth at bedtime as needed for muscle spasms. 04/03/19   Lind Covert, MD  fluticasone (FLOVENT HFA) 220 MCG/ACT inhaler Inhale 1 puff into the lungs 2 (two) times daily. 03/20/18   Lind Covert, MD    Allergies    Patient has no known allergies.  Review of Systems   Review of Systems  All other systems reviewed and are negative.   Physical Exam Updated Vital Signs BP (!) 153/75 (BP Location: Right Arm)   Pulse 69   Temp 98.7 F (37.1 C) (Oral)   Resp 19   SpO2 99%   Physical Exam Vitals and nursing note reviewed.  Constitutional:      Appearance: She is well-developed.  HENT:     Head: Normocephalic and atraumatic.  Eyes:     Conjunctiva/sclera: Conjunctivae normal.     Pupils: Pupils are equal, round, and reactive to  light.  Neck:     Trachea: Phonation normal.  Cardiovascular:     Rate and Rhythm: Normal rate.  Pulmonary:     Effort: Pulmonary effort is normal.  Musculoskeletal:     Cervical back: Normal range of motion and neck supple.     Comments: Index finger left hand with laceration second finger PIP flexion crease.  She has somewhat decreased sensation distal to this, but good capillary refill in the pad of the finger.  Flexion strength  is intact at the PIP and DIP joints of the second finger.  Skin:    General: Skin is warm and dry.  Neurological:     Mental Status: She is alert and oriented to person, place, and time.     Motor: No abnormal muscle tone.  Psychiatric:        Mood and Affect: Mood normal.        Behavior: Behavior normal.        Thought Content: Thought content normal.        Judgment: Judgment normal.     ED Results / Procedures / Treatments   Labs (all labs ordered are listed, but only abnormal results are displayed) Labs Reviewed - No data to display  EKG None  Radiology No results found.  Procedures .Marland KitchenLaceration Repair  Date/Time: 07/25/2019 11:15 PM Performed by: Daleen Bo, MD Authorized by: Daleen Bo, MD   Consent:    Consent obtained:  Verbal   Consent given by:  Patient   Risks discussed:  Infection, pain, poor cosmetic result, need for additional repair and poor wound healing   Alternatives discussed:  No treatment Anesthesia (see MAR for exact dosages):    Anesthesia method:  Local infiltration   Local anesthetic:  Lidocaine 1% w/o epi Laceration details:    Location: Left index finger.   Length (cm):  2   Depth (mm):  8 Repair type:    Repair type:  Simple Pre-procedure details:    Preparation:  Patient was prepped and draped in usual sterile fashion Exploration:    Hemostasis achieved with:  Direct pressure   Wound exploration: wound explored through full range of motion     Wound extent: nerve damage     Wound extent: no  areolar tissue violation noted, no fascia violation noted, no foreign bodies/material noted, no tendon damage noted and no underlying fracture noted     Contaminated: no   Treatment:    Area cleansed with:  Betadine   Amount of cleaning:  Standard   Irrigation solution:  Sterile saline   Irrigation method:  Syringe   Visualized foreign bodies/material removed: no   Skin repair:    Repair method:  Sutures   Suture size:  4-0   Suture material:  Prolene Approximation:    Approximation:  Loose Post-procedure details:    Dressing:  Antibiotic ointment and non-adherent dressing   Patient tolerance of procedure:  Tolerated well, no immediate complications Comments:     Possible digital nerve abnormality injury, distal to the DIP flexion crease.  This is not amenable to surgical repair.   (including critical care time)  Medications Ordered in ED Medications  lidocaine (PF) (XYLOCAINE) 1 % injection 5 mL (has no administration in time range)  Tdap (BOOSTRIX) injection 0.5 mL (0.5 mLs Intramuscular Given 07/25/19 2154)    ED Course  I have reviewed the triage vital signs and the nursing notes.  Pertinent labs & imaging results that were available during my care of the patient were reviewed by me and considered in my medical decision making (see chart for details).    MDM Rules/Calculators/A&P                       Patient Vitals for the past 24 hrs:  BP Temp Temp src Pulse Resp SpO2  07/25/19 2017 (!) 153/75 98.7 F (37.1 C) Oral 69 19 99 %  07/25/19 1734 (!) 153/77 98.3 F (36.8 C) Oral 64 14 97 %  At discharge- reevaluation with update and discussion. After initial assessment and treatment, an updated evaluation reveals she is comfortable has no further complaints. Daleen Bo   Medical Decision Making:  This patient is presenting for evaluation of injury to left index finger, which does not require a range of treatment options, and is a complaint that involves a moderate  risk of morbidity and mortality. The differential diagnoses include laceration, bony injury, tendon injury, nerve injury. 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  I did not require additional historical information from anyone.   Critical Interventions-clinical evaluation, suture repair, assessment of injuries  After These Interventions, the Patient was reevaluated and was found improved and stable for discharge.  Finger laceration sutured, possible digital nerve injury, distal to the flexion crease, not amenable to surgical reapproximation  CRITICAL CARE-no Performed by: Daleen Bo  Nursing Notes Reviewed/ Care Coordinated Applicable Imaging Reviewed Interpretation of Laboratory Data incorporated into ED treatment  The patient appears reasonably screened and/or stabilized for discharge and I doubt any other medical condition or other [facility] requiring further screening, evaluation, or treatment in the ED at this time prior to discharge.  Plan: Home Medications-Tylenol for pain, continue usual medications; Home Treatments-wound care at home; return here if the recommended treatment, does not improve the symptoms; Recommended follow up-PCP, as needed for suture removal and ongoing management   Final Clinical Impression(s) / ED Diagnoses Final diagnoses:  Laceration of left index finger without foreign body, nail damage status unspecified, initial encounter    Rx / DC Orders ED Discharge Orders    None       Daleen Bo, MD 07/26/19 0001

## 2019-07-25 NOTE — Discharge Instructions (Addendum)
Remove the bandage tomorrow afternoon.  Clean the wound twice a day with soap and water.  Avoid soaking the hand in water until the stitches are out.  Take Tylenol for pain.

## 2019-07-25 NOTE — ED Triage Notes (Signed)
Pt here ems with lac to L index finger. Bleeding controlled. Hx diabetes. Last tetanus 2015. VSS with ems. No blood thinners.

## 2019-07-26 DIAGNOSIS — Z7401 Bed confinement status: Secondary | ICD-10-CM | POA: Diagnosis not present

## 2019-07-26 DIAGNOSIS — I959 Hypotension, unspecified: Secondary | ICD-10-CM | POA: Diagnosis not present

## 2019-07-26 DIAGNOSIS — M2550 Pain in unspecified joint: Secondary | ICD-10-CM | POA: Diagnosis not present

## 2019-07-26 NOTE — ED Notes (Signed)
Sign pad unavailable upon pt departure. Pt provided with discharge instructions, and verbalizes understanding. Pt a&ox4. Pt armband removed.

## 2019-07-27 ENCOUNTER — Encounter: Payer: Self-pay | Admitting: Podiatry

## 2019-07-27 ENCOUNTER — Ambulatory Visit (INDEPENDENT_AMBULATORY_CARE_PROVIDER_SITE_OTHER): Payer: Medicare Other | Admitting: Podiatry

## 2019-07-27 ENCOUNTER — Other Ambulatory Visit: Payer: Self-pay

## 2019-07-27 DIAGNOSIS — B351 Tinea unguium: Secondary | ICD-10-CM

## 2019-07-27 DIAGNOSIS — M79674 Pain in right toe(s): Secondary | ICD-10-CM | POA: Diagnosis not present

## 2019-07-27 DIAGNOSIS — M79675 Pain in left toe(s): Secondary | ICD-10-CM | POA: Diagnosis not present

## 2019-07-27 NOTE — Patient Instructions (Signed)
Diabetes Mellitus and Foot Care Foot care is an important part of your health, especially when you have diabetes. Diabetes may cause you to have problems because of poor blood flow (circulation) to your feet and legs, which can cause your skin to:  Become thinner and drier.  Break more easily.  Heal more slowly.  Peel and crack. You may also have nerve damage (neuropathy) in your legs and feet, causing decreased feeling in them. This means that you may not notice minor injuries to your feet that could lead to more serious problems. Noticing and addressing any potential problems early is the best way to prevent future foot problems. How to care for your feet Foot hygiene  Wash your feet daily with warm water and mild soap. Do not use hot water. Then, pat your feet and the areas between your toes until they are completely dry. Do not soak your feet as this can dry your skin.  Trim your toenails straight across. Do not dig under them or around the cuticle. File the edges of your nails with an emery board or nail file.  Apply a moisturizing lotion or petroleum jelly to the skin on your feet and to dry, brittle toenails. Use lotion that does not contain alcohol and is unscented. Do not apply lotion between your toes. Shoes and socks  Wear clean socks or stockings every day. Make sure they are not too tight. Do not wear knee-high stockings since they may decrease blood flow to your legs.  Wear shoes that fit properly and have enough cushioning. Always look in your shoes before you put them on to be sure there are no objects inside.  To break in new shoes, wear them for just a few hours a day. This prevents injuries on your feet. Wounds, scrapes, corns, and calluses  Check your feet daily for blisters, cuts, bruises, sores, and redness. If you cannot see the bottom of your feet, use a mirror or ask someone for help.  Do not cut corns or calluses or try to remove them with medicine.  If you  find a minor scrape, cut, or break in the skin on your feet, keep it and the skin around it clean and dry. You may clean these areas with mild soap and water. Do not clean the area with peroxide, alcohol, or iodine.  If you have a wound, scrape, corn, or callus on your foot, look at it several times a day to make sure it is healing and not infected. Check for: ? Redness, swelling, or pain. ? Fluid or blood. ? Warmth. ? Pus or a bad smell. General instructions  Do not cross your legs. This may decrease blood flow to your feet.  Do not use heating pads or hot water bottles on your feet. They may burn your skin. If you have lost feeling in your feet or legs, you may not know this is happening until it is too late.  Protect your feet from hot and cold by wearing shoes, such as at the beach or on hot pavement.  Schedule a complete foot exam at least once a year (annually) or more often if you have foot problems. If you have foot problems, report any cuts, sores, or bruises to your health care provider immediately. Contact a health care provider if:  You have a medical condition that increases your risk of infection and you have any cuts, sores, or bruises on your feet.  You have an injury that is not   healing.  You have redness on your legs or feet.  You feel burning or tingling in your legs or feet.  You have pain or cramps in your legs and feet.  Your legs or feet are numb.  Your feet always feel cold.  You have pain around a toenail. Get help right away if:  You have a wound, scrape, corn, or callus on your foot and: ? You have pain, swelling, or redness that gets worse. ? You have fluid or blood coming from the wound, scrape, corn, or callus. ? Your wound, scrape, corn, or callus feels warm to the touch. ? You have pus or a bad smell coming from the wound, scrape, corn, or callus. ? You have a fever. ? You have a red line going up your leg. Summary  Check your feet every day  for cuts, sores, red spots, swelling, and blisters.  Moisturize feet and legs daily.  Wear shoes that fit properly and have enough cushioning.  If you have foot problems, report any cuts, sores, or bruises to your health care provider immediately.  Schedule a complete foot exam at least once a year (annually) or more often if you have foot problems. This information is not intended to replace advice given to you by your health care provider. Make sure you discuss any questions you have with your health care provider. Document Revised: 11/22/2018 Document Reviewed: 04/02/2016 Elsevier Patient Education  2020 [employer]  

## 2019-07-27 NOTE — Progress Notes (Signed)
Subjective: Joyce Brown presents today preventative diabetic foot care and painful mycotic nails b/l that are difficult to trim. Pain interferes with ambulation. Aggravating factors include wearing enclosed shoe gear. Pain is relieved with periodic professional debridement.  Lind Covert, MD is patient's PCP. Last visit was: 06/27/2019.  She voices no new pedal problems on today's visit. She had a visit to the E.R. for laceration of her left index finger with a box cutter.  She has stitches in her finger and is recovering well.  Past Medical History:  Diagnosis Date  . Asthma   . Cerebellar ataxia ([place])   . Diabetes mellitus without complication ([place])   . Gait abnormality 11/07/2018  . Glaucoma   . Hyperlipidemia   . Hypertension   . Pulmonary embolism ([place])   . Sleep apnea   . Spinal stenosis      Current Outpatient Medications on File Prior to Visit  Medication Sig Dispense Refill  . acetaminophen (TYLENOL) 325 MG tablet Take 650 mg by mouth every 6 (six) hours as needed for moderate pain.     Marland Kitchen atorvastatin (LIPITOR) 40 MG tablet TAKE ONE TABLET BY MOUTH DAILY AT 6 PM (Patient taking differently: Take 40 mg by mouth daily at 6 PM. ) 90 tablet 2  . Calcium Citrate 250 MG TABS Take 2 tablets (500 mg total) by mouth 2 (two) times daily. 120 tablet 11  . Cholecalciferol (VITAMIN D3) 75 MCG (3000 UT) TABS Take 75 mcg by mouth daily.     . Continuous Blood Gluc Sensor (DEXCOM G6 SENSOR) MISC 1 each by Other route daily.     Marland Kitchen FLOVENT HFA 220 MCG/ACT inhaler INHALE 1 PUFF INTO THE LUNGS TWO TIMES A DAY 12 g 3  . glucose blood test strip 1 each by Other route daily. ASCENSIA CONTOUR NEXT ONE: Use to check blood sugar    . injection device for insulin (INPEN 100-PINK-NOVO) DEVI As directed E10.65 1 each 0  . insulin aspart (NOVOLOG) cartridge Inject 10 Units into the skin daily with breakfast AND 12 Units daily with lunch AND 9 Units daily with supper. Max daily dose of 60 units.  45 mL 3  . insulin degludec (TRESIBA FLEXTOUCH) 100 UNIT/ML FlexTouch Pen Inject 0.18 mLs (18 Units total) into the skin daily. (Patient taking differently: Inject 17 Units into the skin daily. ) 15 mL 3  . Insulin Pen Needle (B-D UF III MINI PEN NEEDLES) 31G X 5 MM MISC Four times a day (Patient taking differently: 1 each by Other route 4 (four) times daily. ) 400 each 3  . Insulin Syringe-Needle U-100 31G X 15/64" 0.5 ML MISC 1 Device by Does not apply route daily. 100 each 6  . MICROLET LANCETS MISC Twice a day (Patient taking differently: 1 each by Other route 2 (two) times daily. ) 100 each 11  . Multiple Vitamin (MULTIVITAMIN) capsule Take 4 capsules by mouth daily. Bariatric fusion     . mycophenolate (CELLCEPT) 500 MG tablet Take 2 tablets (1,000 mg total) by mouth 2 (two) times daily. 360 tablet 3  . oxybutynin (DITROPAN) 5 MG tablet TAKE ONE TABLET BY MOUTH TWICE A DAY 180 tablet 3  . sertraline (ZOLOFT) 50 MG tablet Take 1 tablet (50 mg total) by mouth daily. (Patient taking differently: Take 25 mg by mouth daily. Half a tab in a.m.) 90 tablet 0  . tiZANidine (ZANAFLEX) 2 MG tablet Take 1-2 tablets (2-4 mg total) by mouth at bedtime as needed for  muscle spasms. 30 tablet 3  . [DISCONTINUED] fluticasone (FLOVENT HFA) 220 MCG/ACT inhaler Inhale 1 puff into the lungs 2 (two) times daily. 3 Inhaler 4   No current facility-administered medications on file prior to visit.     No Known Allergies  Objective: Joyce Brown is a pleasant 69 y.o. y.o. Patient Race: White or Caucasian [1]  female in NAD. AAO x 3.  There were no vitals filed for this visit.  Neurovascular status intact and unchanged b/l.   Dermatological Examination: Pedal skin with normal turgor, texture and tone bilaterally. No open wounds bilaterally. No interdigital macerations bilaterally. Toenails 1-5 b/l elongated, dystrophic, thickened, crumbly with subungual debris and tenderness to dorsal  palpation.  Musculoskeletal: Normal muscle strength 5/5 to all lower extremity muscle groups bilaterally. No pain crepitus or joint limitation noted with ROM b/l. Hallux valgus with bunion deformity noted b/l. Hammertoes noted to the 2-5 bilaterally. Utilizes wheelchair for mobility assistance.    Assessment: 1. Pain due to onychomycosis of toenails of both feet    Plan: -Examined patient. -No new findings. No new orders. -Continue diabetic foot care principles. Literature dispensed on today.  -Toenails 1-5 b/l were debrided in length and girth with sterile nail nippers and dremel without iatrogenic bleeding.  -Patient to continue soft, supportive shoe gear daily. -Patient to report any pedal injuries to medical professional immediately. -Patient/POA to call should there be question/concern in the interim.  Return in about 3 months (around 10/27/2019) for diabetic nail trim.  Marzetta Board, DPM

## 2019-07-31 ENCOUNTER — Encounter: Payer: Self-pay | Admitting: Internal Medicine

## 2019-08-02 ENCOUNTER — Ambulatory Visit: Payer: Medicare Other

## 2019-08-02 ENCOUNTER — Other Ambulatory Visit: Payer: Self-pay

## 2019-08-02 DIAGNOSIS — Z4802 Encounter for removal of sutures: Secondary | ICD-10-CM

## 2019-08-02 NOTE — Progress Notes (Signed)
Patient presents in nurse clinic for suture removal from 5/12 laceration to finger.  4 sutures were successfully removed. Wound well appearing, no redness or swelling. Patient does report some feelings of numbness at lidocaine site. Patient advised to follow-up with PCP if this does not resolve. Patient states she has an apt with ENT in June.

## 2019-08-06 ENCOUNTER — Other Ambulatory Visit: Payer: Self-pay | Admitting: Family Medicine

## 2019-08-15 ENCOUNTER — Encounter: Payer: Self-pay | Admitting: Family Medicine

## 2019-08-15 ENCOUNTER — Ambulatory Visit (INDEPENDENT_AMBULATORY_CARE_PROVIDER_SITE_OTHER): Payer: Medicare Other | Admitting: Family Medicine

## 2019-08-15 ENCOUNTER — Other Ambulatory Visit: Payer: Self-pay

## 2019-08-15 VITALS — BP 122/60 | HR 59 | Wt 182.0 lb

## 2019-08-15 DIAGNOSIS — S61211D Laceration without foreign body of left index finger without damage to nail, subsequent encounter: Secondary | ICD-10-CM

## 2019-08-15 DIAGNOSIS — Z1211 Encounter for screening for malignant neoplasm of colon: Secondary | ICD-10-CM | POA: Insufficient documentation

## 2019-08-15 DIAGNOSIS — Z515 Encounter for palliative care: Secondary | ICD-10-CM | POA: Insufficient documentation

## 2019-08-15 DIAGNOSIS — Z1212 Encounter for screening for malignant neoplasm of rectum: Secondary | ICD-10-CM | POA: Diagnosis not present

## 2019-08-15 DIAGNOSIS — S61211A Laceration without foreign body of left index finger without damage to nail, initial encounter: Secondary | ICD-10-CM | POA: Insufficient documentation

## 2019-08-15 NOTE — Assessment & Plan Note (Signed)
Patient concerned about persisting lack of sensation on the finger pad of her finger after a deep laceration along the palmar surface of the distal second finger joint.  She said she was told from the emergency physician sensation might not come back but she wanted to double check.  She also said she thought there might have been some redness the other day and would like [place] to verify that there is no infection and she says her finger has felt a little sore with flexion.  Reassuring exam, no erythema, no purulence, sensation on the dorsal surface of the finger is intact and there is good cap refill.  There is some palpable scar tissue along the incision on the palmar surface joint line by patient is full motor control.  Discussed patient I do not see indication of infection and I am not sure if she will regain sensation on that finger pad in the same way she has in the past.  Return precautions discussed

## 2019-08-15 NOTE — Assessment & Plan Note (Signed)
Discussed with patient that I think her colonoscopy is due per the charting in the computer.  She says that 1 have been ordered for her in the past but she was told that she was not due until 2025.  I have been unable to verify this with my own chart review, her PCP has been notified

## 2019-08-15 NOTE — Patient Instructions (Addendum)
Today looked at the finger that he had some concerns about.  Not particular sure when the sensation to the finger pad of the finger will come back or if it will.  I can say that you have good blood perfusion and that I do not think that the wound itself appears infected in any way.  If anything starts getting red or swell significantly it would be very reasonable to come back and let us take another look at it.  I am sending a message to Dr. Erin Hearing to try and verify the timing of your next colonoscopy.  I have been unable to find charting saying that your next was due in 2025.  As always it is your decision if you want to get a colonoscopy but I want you to know the recommended timelines.  Dr. Criss Rosales

## 2019-08-15 NOTE — Progress Notes (Signed)
° ° °  SUBJECTIVE:   CHIEF COMPLAINT / HPI: Follow-up for finger laceration  Laceration of left index finger without foreign body without damage to nail Patient concerned about persisting lack of sensation on the finger pad of her finger after a deep laceration along the palmar surface of the distal second finger joint.  She said she was told from the emergency physician sensation might not come back but she wanted to double check.  She also said she thought there might have been some redness the other day and would like [place] to verify that there is no infection and she says her finger has felt a little sore with flexion.  Of note she does use a manual wheelchair to get around  [place]  PMH / [place]:   OBJECTIVE:   BP 122/60    Pulse (!) 59    Wt 182 lb (82.6 kg)    SpO2 100%    BMI 26.11 kg/m   General: Alert and pleasant in no distress in a manual wheelchair Finger exam: Cap refill 2, sensation deficit on the finger pad of the second left finger.  Sensation normal on the dorsal surface of the finger.  Full motor control with full range of motion.  No erythema at the site of the laceration which is in the palmar surface crease.  There is no dehiscence and the wound appears to be healing appropriately  ASSESSMENT/PLAN:   Laceration of left index finger without foreign body without damage to nail Patient concerned about persisting lack of sensation on the finger pad of her finger after a deep laceration along the palmar surface of the distal second finger joint.  She said she was told from the emergency physician sensation might not come back but she wanted to double check.  She also said she thought there might have been some redness the other day and would like [place] to verify that there is no infection and she says her finger has felt a little sore with flexion.  Reassuring exam, no erythema, no purulence, sensation on the dorsal surface of the finger is intact and there is good cap refill.  There is  some palpable scar tissue along the incision on the palmar surface joint line by patient is full motor control.  Discussed patient I do not see indication of infection and I am not sure if she will regain sensation on that finger pad in the same way she has in the past.  Return precautions discussed  Encounter for colorectal cancer screening Discussed with patient that I think her colonoscopy is due per the charting in the computer.  She says that 1 have been ordered for her in the past but she was told that she was not due until 2025.  I have been unable to verify this with my own chart review, her PCP has been notified     Sherene Sires, [place]

## 2019-08-17 ENCOUNTER — Other Ambulatory Visit: Payer: Self-pay

## 2019-08-17 ENCOUNTER — Encounter: Payer: Self-pay | Admitting: Dietician

## 2019-08-17 ENCOUNTER — Encounter: Payer: Medicare Other | Attending: Internal Medicine | Admitting: Dietician

## 2019-08-17 DIAGNOSIS — E1065 Type 1 diabetes mellitus with hyperglycemia: Secondary | ICD-10-CM | POA: Diagnosis present

## 2019-08-17 DIAGNOSIS — IMO0002 Reserved for concepts with insufficient information to code with codable children: Secondary | ICD-10-CM

## 2019-08-17 DIAGNOSIS — E108 Type 1 diabetes mellitus with unspecified complications: Secondary | ICD-10-CM | POA: Diagnosis present

## 2019-08-17 NOTE — Progress Notes (Signed)
Diabetes Self-Management Education  Visit Type: (P) Follow-up  Appt. Start Time: 0910 Appt. End Time: 0940  08/17/2019  [name], identified by name and date of birth, is a 69 y.o. female with a diagnosis of Diabetes:  .   ASSESSMENT Patient is here today alone and was last seen by me on 06/15/2019. She is not depending on frozen meals. She is logging food in [place] people. She feels that she is not getting enough protein as she is not eating much meat.  Discussed protein sources. She states that she is trying to keep her carbohydrate intake at about 30 grams per meal. She does snack occasionally on fruit.  Discussed that she does not give a bolus for this and it increases her blood glucose.  Provided alternative options such as veges and humus or nuts but to watch portion of the nuts due to calories density. Less frequent lows.  Continues to overtreat low blood glucose. A1C is improved. 7.5% 07/19/2019 Average glucose 211. Weight decreased from 181 lbs 08/03/19 to 177 lbs 08/17/2019.  She states that she is losing weight purposefully. She is trying to exercise daily at least 15 minutes per day and will be going to the pool to walk soon since it is warmer.  She continues to use the Dexcom G-6 (report reviewed). And InPen.  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  Type 1 Diabetes since 2007, asthma, cerebellar ataxia, HTN, HLD, OSA on C-pap and sleep study Julyl 6. Noted swallow evaluation with recommendation of a Dysphagia 3 diet. Dexcom G-6 since 07/11/18. Inpen recently. Medication includes Tresiba 17 units q HS and Novolog 9 units before breakfast,12 units before lunch and 9 units before dinner. She is not aiming to give this 30 minutes prior to the meal which has improved her blood sugar control  24 hour recall: Breakfast:  Chobani yogurt, blueberries, 1/2 cup watermelon  OR dark bread with PB and banana Lunch:  Tuna sandwich on  dark bread, baby carrots, occasional fresh berries OR Lavosh bread, humus, chicken  Snack:  Occasional fresh fruit or freeze dried fruit D:  Steamed vegetables, rotisserie chicken, very small white and sweet potato Beverages:  Continues to drink a lot of water, 1 cup coffee 2T half and half and Sugar free syrup, selzer water with sugar free syrup  Patient lives alone. She uses a wheel chair. She is doing her own grocery shopping. She uses [employer] for medical appointments but is able to wheel herself to the grocery store. 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 She moved here from [place] last year to be closer to one of her sister's who lives in [place]. The pool at her apartment is to reopen outdoors. Patient enjoys walking in the pool.  She last saw an RD in [place] about 11 months prior to my first visit.   Diabetes Self-Management Education - 08/17/19 1000      Visit Information   Visit Type  Follow-up  (Pended)       Pre-Education Assessment   Patient understands the diabetes disease and treatment process.  Demonstrates understanding / competency  (Pended)     Patient understands incorporating nutritional management into lifestyle.  Needs Review  (Pended)     Patient undertands incorporating physical activity into lifestyle.  Needs Review  (Pended)     Patient understands using medications safely.  Demonstrates understanding / competency  (Pended)     Patient  understands monitoring blood glucose, interpreting and using results  Demonstrates understanding / competency  (Pended)     Patient understands prevention, detection, and treatment of acute complications.  Demonstrates understanding / competency  (Pended)     Patient understands prevention, detection, and treatment of chronic complications.  Demonstrates understanding / competency  (Pended)     Patient understands how to develop strategies to address  psychosocial issues.  Demonstrates understanding / competency  (Pended)     Patient understands how to develop strategies to promote health/change behavior.  Needs Review  (Pended)       Complications   Last HgB A1C per patient/outside source  7.5 %  (Pended)    07/2019   How often do you check your blood sugar?  > 4 times/day  (Pended)       Subsequent Visit   Since your last visit have you continued or begun to take your medications as prescribed?  Yes  (Pended)     Since your last visit have you experienced any weight changes?  Loss  (Pended)     Since your last visit, are you checking your blood glucose at least once a day?  Yes  (Pended)        Individualized Plan for Diabetes Self-Management Training:   Learning Objective:  Patient will have a greater understanding of diabetes self-management. Patient education plan is to attend individual and/or group sessions per assessed needs and concerns.   Plan:   There are no Patient Instructions on file for this visit.  Expected Outcomes:     Education material provided: Protein (facts, sources), vegetarian protein sources and amounts  If problems or questions, patient to contact team via:  Phone and Email  Future DSME appointment:

## 2019-08-21 DIAGNOSIS — H9313 Tinnitus, bilateral: Secondary | ICD-10-CM | POA: Diagnosis not present

## 2019-08-21 DIAGNOSIS — R270 Ataxia, unspecified: Secondary | ICD-10-CM | POA: Diagnosis not present

## 2019-08-21 DIAGNOSIS — H903 Sensorineural hearing loss, bilateral: Secondary | ICD-10-CM | POA: Diagnosis not present

## 2019-08-21 DIAGNOSIS — H6123 Impacted cerumen, bilateral: Secondary | ICD-10-CM | POA: Diagnosis not present

## 2019-08-31 ENCOUNTER — Ambulatory Visit
Admission: RE | Admit: 2019-08-31 | Discharge: 2019-08-31 | Disposition: A | Discharge status: Home / Self Care | Payer: Medicare Other | Source: Ambulatory Visit | Attending: Family Medicine | Admitting: Family Medicine

## 2019-08-31 ENCOUNTER — Other Ambulatory Visit: Payer: Self-pay

## 2019-08-31 DIAGNOSIS — Z1231 Encounter for screening mammogram for malignant neoplasm of breast: Secondary | ICD-10-CM

## 2019-09-03 ENCOUNTER — Encounter: Payer: Self-pay | Admitting: Internal Medicine

## 2019-09-05 ENCOUNTER — Other Ambulatory Visit: Payer: Self-pay | Admitting: Family Medicine

## 2019-09-05 DIAGNOSIS — R928 Other abnormal and inconclusive findings on diagnostic imaging of breast: Secondary | ICD-10-CM

## 2019-09-13 ENCOUNTER — Other Ambulatory Visit: Payer: Self-pay

## 2019-09-13 ENCOUNTER — Ambulatory Visit: Payer: Medicare Other

## 2019-09-13 ENCOUNTER — Ambulatory Visit
Admission: RE | Admit: 2019-09-13 | Discharge: 2019-09-13 | Disposition: A | Discharge status: Home / Self Care | Payer: Medicare Other | Source: Ambulatory Visit | Attending: Family Medicine | Admitting: Family Medicine

## 2019-09-13 DIAGNOSIS — R928 Other abnormal and inconclusive findings on diagnostic imaging of breast: Secondary | ICD-10-CM

## 2019-09-15 ENCOUNTER — Other Ambulatory Visit: Payer: Self-pay | Admitting: Family Medicine

## 2019-09-18 ENCOUNTER — Encounter: Payer: Self-pay | Admitting: Internal Medicine

## 2019-09-25 ENCOUNTER — Other Ambulatory Visit: Payer: Self-pay

## 2019-09-25 ENCOUNTER — Encounter: Payer: Self-pay | Admitting: Dietician

## 2019-09-25 ENCOUNTER — Encounter: Payer: Medicare Other | Attending: Internal Medicine | Admitting: Dietician

## 2019-09-25 DIAGNOSIS — E108 Type 1 diabetes mellitus with unspecified complications: Secondary | ICD-10-CM | POA: Diagnosis not present

## 2019-09-25 DIAGNOSIS — E1065 Type 1 diabetes mellitus with hyperglycemia: Secondary | ICD-10-CM | POA: Insufficient documentation

## 2019-09-25 DIAGNOSIS — IMO0002 Reserved for concepts with insufficient information to code with codable children: Secondary | ICD-10-CM

## 2019-09-25 NOTE — Patient Instructions (Signed)
Consider using Oat Milk rather than OJ in your smoothie.  Look at carbohydrate counting more closely to fine tune this.  Resources for this include:   yellow card    Calorie [employer] Pace app

## 2019-09-25 NOTE — Progress Notes (Signed)
Diabetes Self-Management Education  Visit Type: Follow-up  Appt. Start Time: 0900 Appt. End Time: 0930  09/25/2019  [name], identified by name and date of birth, is a 69 y.o. female with a diagnosis of Diabetes:  .   ASSESSMENT  Patient is here today alone and was last seen by myself on 08/17/2019.  She brought in her full reports from the In-Pen and Dexcom. Average Glucose 191 and Time in Range was 76% with 1% lows and <1% severe lows (April 14-September 24, 2019)  Started going to the pool for 30-45 minutes 4 days per week.  Her goal to increase to 5 days per week.  She does note increased muscle fatigue when she does the water exercises (squats, stretches, walking in the pool) 5 days per week.  She notes that her Blood Glucose is better controlled since starting this. She states that her standing balance is now much improved out of the water.  Medications continued Tresiba 17 grams and Novolog based on In Pen (carb intake and blood glucose calculation).  She notes improved control on Tresiba.  She continues to use the Dexcom G-6 (report reviewed). And InPen.  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  Type 1 Diabetes since 2007, asthma, cerebellar ataxia, HTN, HLD, OSA on C-pap and sleep study Julyl 6. Noted swallow evaluation with recommendation of a Dysphagia 3 diet. Dexcom G-6 since 07/11/18. Inpen recently. Medication includes Tresiba 17 units q HS and Novolog 9 units before breakfast,12 units before lunch and 9 units before dinner. She is not aiming to give this 30 minutes prior to the meal which has improved her blood sugar control  24 hour diet recall:  Based on her carb counting data, she is underestimating her carb intake Using a diet plate with pictures that helps with meal planning.  Breakfast:  Frozen fruit, 1 banana less sugar yogurt, protein powder (sun warrior plant based), OJ, water, baby  carrots  POOL  Occasional leftover smoothie at 10:30  Lunch:  Humus and tuna on fresh spinach, raw carrots or cauliflower or broccoli  Dinner:  Bowl with beans, raw spinach, vege burger, onions, occasional boiled egg  Patient lives alone. She uses a wheel chair. She is doing her own grocery shopping. She uses [employer] for medical appointments but is able to wheel herself to the grocery store. 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 She moved here from [place] last year to be closer to one of her sister's who lives in [place]. The pool at her apartment is to reopen outdoors. Patient enjoys walking in the pool.  She last saw an RD in [place] about 11 months prior to my first visit.   Diabetes Self-Management Education - 09/25/19 1500      Visit Information   Visit Type Follow-up      Pre-Education Assessment   Patient understands the diabetes disease and treatment process. Demonstrates understanding / competency    Patient understands incorporating nutritional management into lifestyle. Needs Review    Patient undertands incorporating physical activity into lifestyle. Demonstrates understanding / competency    Patient understands using medications safely. Demonstrates understanding / competency    Patient understands monitoring blood glucose, interpreting and using results Demonstrates understanding / competency    Patient understands prevention, detection, and treatment of acute complications. Demonstrates understanding / competency    Patient understands prevention, detection, and treatment of chronic complications. Demonstrates understanding / competency  Patient understands how to develop strategies to address psychosocial issues. Demonstrates understanding / competency    Patient understands how to develop strategies to promote health/change behavior. Needs Review      Exercise   Exercise Type Moderate  (swimming / aerobic walking)    How many days per week to you exercise? 4    How many minutes per day do you exercise? 45    Total minutes per week of exercise 180      Patient Education   Previous Diabetes Education Yes (please comment)   08/17/2019   Nutrition management  Carbohydrate counting;Meal options for control of blood glucose level and chronic complications.    Physical activity and exercise  Role of exercise on diabetes management, blood pressure control and cardiac health.    Medications Reviewed patients medication for diabetes, action, purpose, timing of dose and side effects.    Monitoring Other (comment)   reviewed CGM reports   Psychosocial adjustment Worked with patient to identify barriers to care and solutions      Individualized Goals (developed by patient)   Nutrition General guidelines for healthy choices and portions discussed;Other (comment)   carb counting   Physical Activity Exercise 5-7 days per week;45 minutes per day    Medications take my medication as prescribed    Monitoring  test my blood glucose as discussed    Problem Solving carb counting    Reducing Risk examine blood glucose patterns;treat hypoglycemia with 15 grams of carbs if blood glucose less than 70mg /dL    Health Coping discuss diabetes with (comment)   MD, RD, CDCES     Patient Self-Evaluation of Goals - Patient rates self as meeting previously set goals (% of time)   Nutrition >75%    Physical Activity >75%    Medications >75%    Monitoring >75%    Problem Solving >75%    Reducing Risk >75%    Health Coping >75%      Post-Education Assessment   Patient understands the diabetes disease and treatment process. Demonstrates understanding / competency    Patient understands incorporating nutritional management into lifestyle. Needs Review    Patient undertands incorporating physical activity into lifestyle. Demonstrates understanding / competency    Patient understands using medications  safely. Demonstrates understanding / competency    Patient understands monitoring blood glucose, interpreting and using results Demonstrates understanding / competency    Patient understands prevention, detection, and treatment of acute complications. Demonstrates understanding / competency    Patient understands prevention, detection, and treatment of chronic complications. Demonstrates understanding / competency    Patient understands how to develop strategies to address psychosocial issues. Demonstrates understanding / competency    Patient understands how to develop strategies to promote health/change behavior. Demonstrates understanding / competency      Outcomes   Expected Outcomes Demonstrated interest in learning. Expect positive outcomes    Future DMSE 2 months    Program Status Not Completed      Subsequent Visit   Since your last visit have you continued or begun to take your medications as prescribed? Yes           Individualized Plan for Diabetes Self-Management Training:   Learning Objective:  Patient will have a greater understanding of diabetes self-management. Patient education plan is to attend individual and/or group sessions per assessed needs and concerns.   Plan:   Patient Instructions  Consider using Oat Milk rather than OJ in your smoothie.  Look at carbohydrate counting  more closely to fine tune this.  Resources for this include:   yellow card    Calorie [employer] Pace app   Expected Outcomes:  Demonstrated interest in learning. Expect positive outcomes  Education material provided:   If problems or questions, patient to contact team via:  Phone  Future DSME appointment: 2 months

## 2019-10-14 IMAGING — MG STEREOTACTIC CORE NEEDLE BIOPSY
8 of 9 series · 8 of 13 positions shown · non-contrast
Comparison: Previous exams.

Addendum:
CLINICAL DATA: Patient presents for stereotactic core needle biopsy
of left breast calcifications.

EXAM:
LEFT BREAST STEREOTACTIC CORE NEEDLE BIOPSY

[L (1 of 7)]
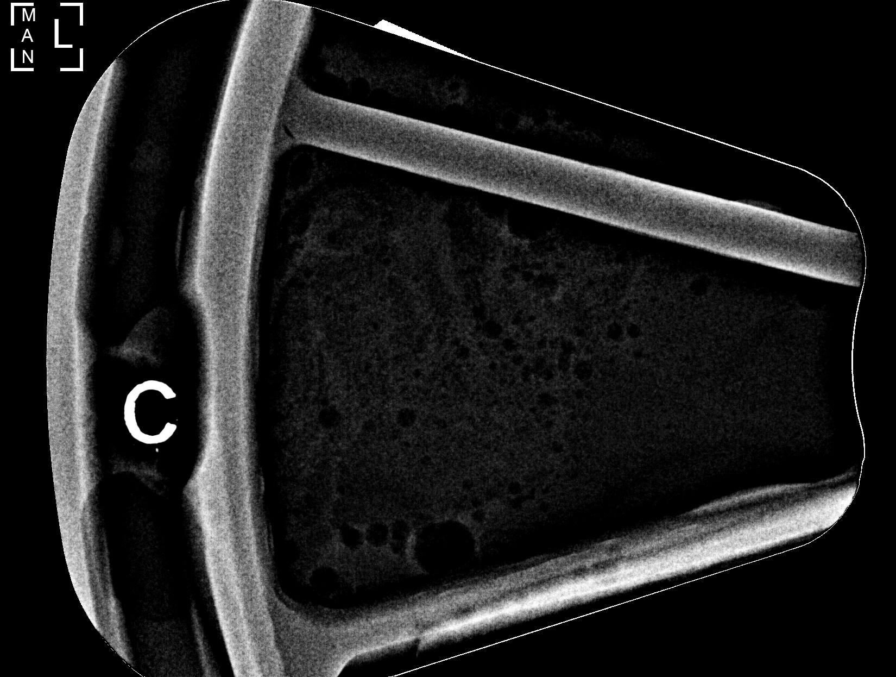

[L (2 of 7)]
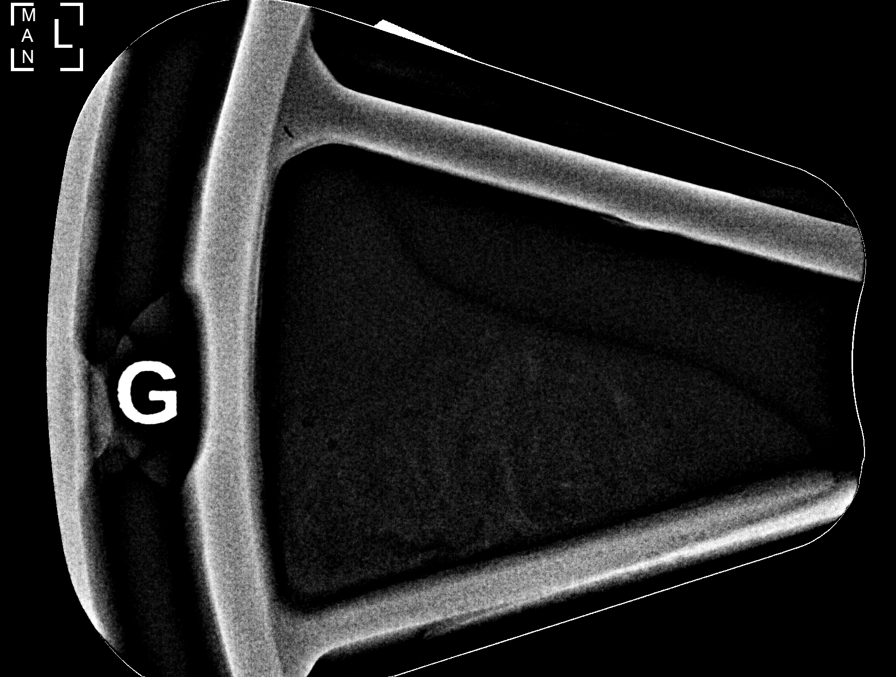

[L (3 of 7)]
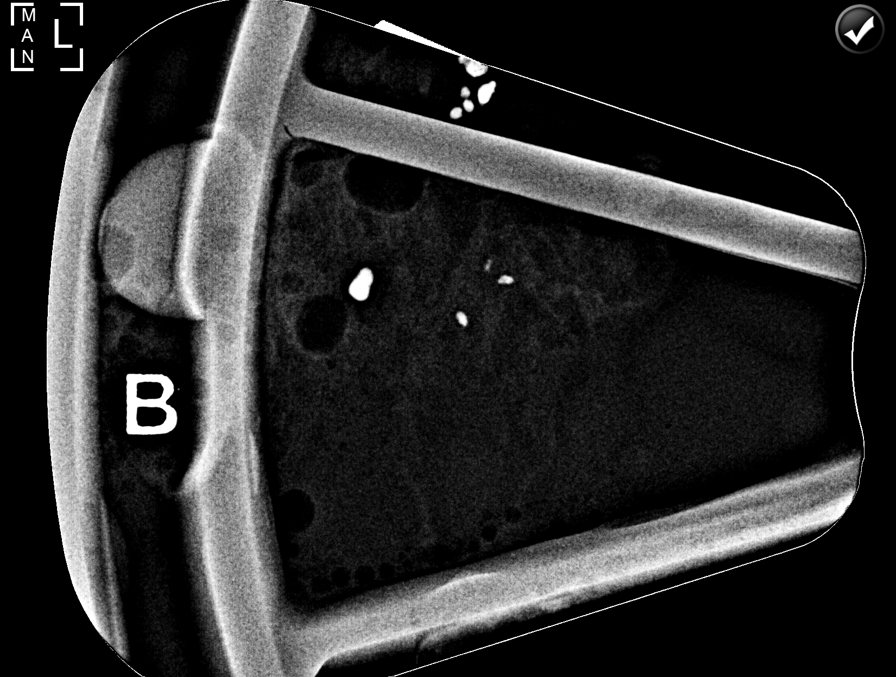

[L (4 of 7)]
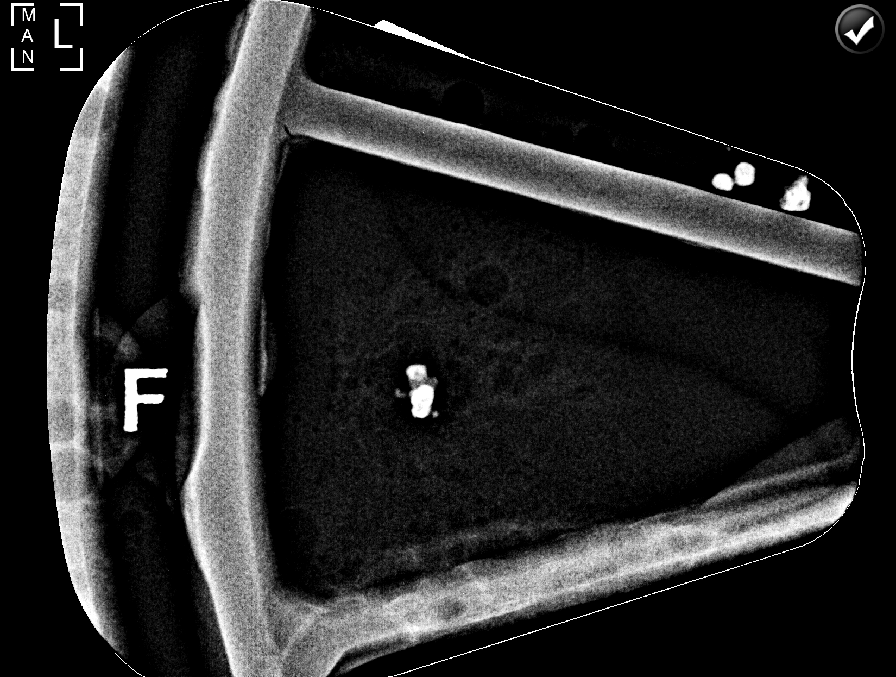

[L (5 of 7)]
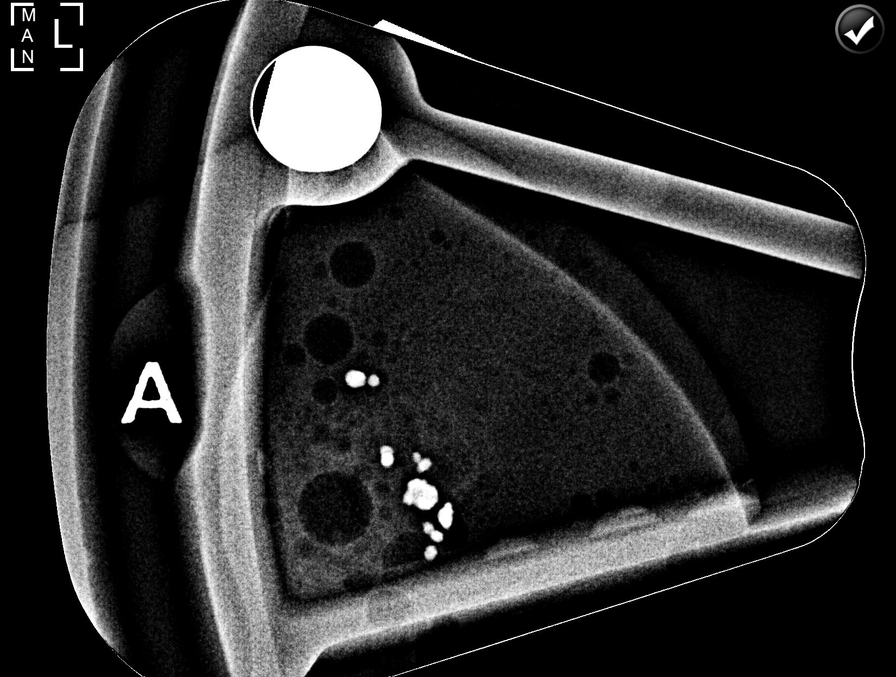

[L (6 of 7)]
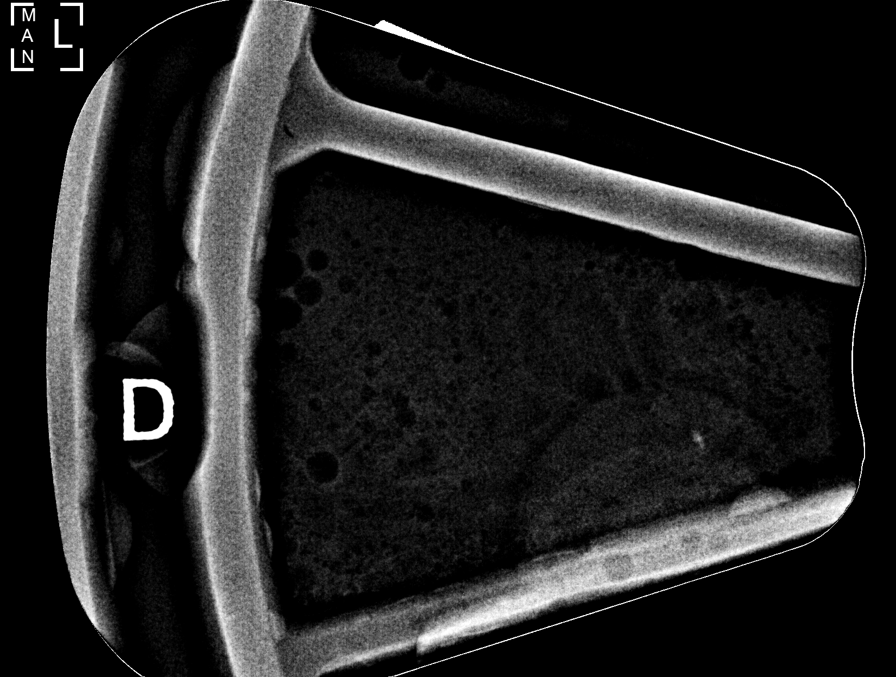

[L (7 of 7)]
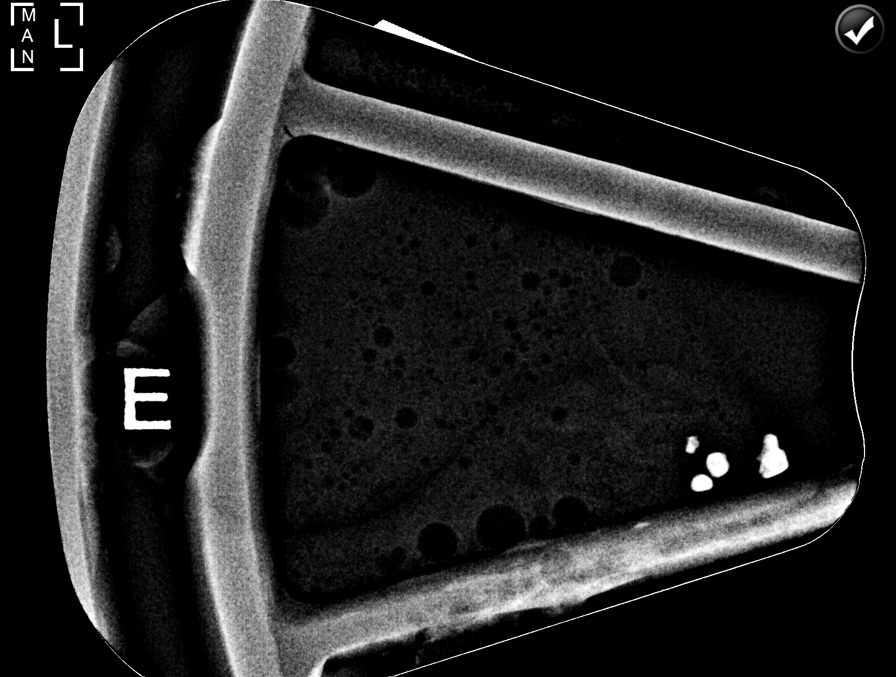

[L CC]
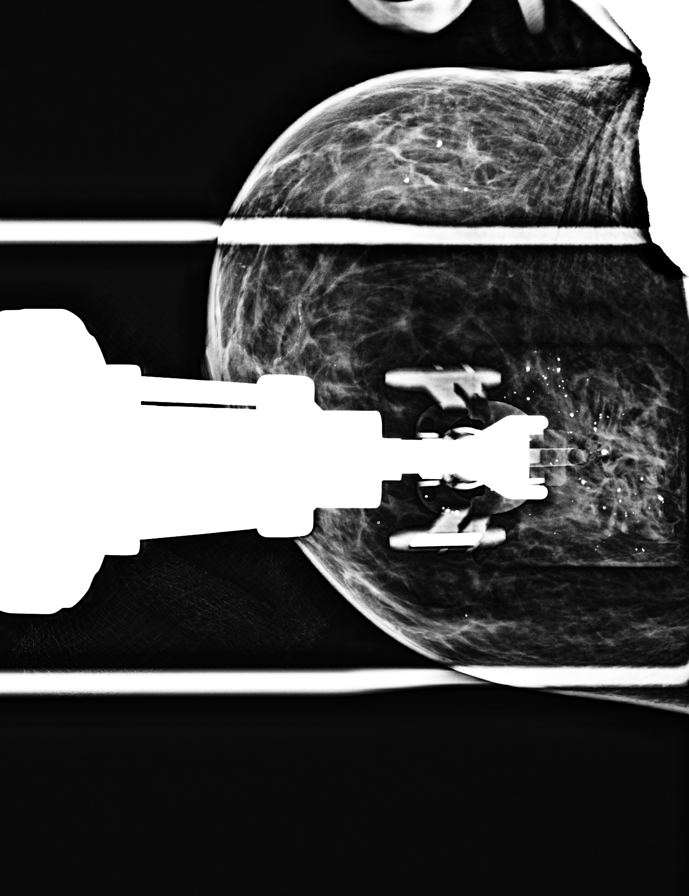

[8 of 13 positions shown; findings below may reference images not displayed]



Using sterile technique and 1% Lidocaine as local anesthetic, under
stereotactic guidance, a 9 gauge vacuum assisted device was used to
perform core needle biopsy of calcifications in the upper outer
quadrant of the left breast using a superior approach. Specimen
radiograph was performed showing multiple calcifications for which
biopsy was performed. Specimens with calcifications are identified
for pathology.

Lesion quadrant: Upper outer quadrant

At the conclusion of the procedure, a coil shaped tissue marker clip
was deployed into the biopsy cavity. Follow-up 2-view mammogram was
performed and dictated separately.
IMPRESSION: Stereotactic-guided biopsy of left breast calcifications. No
apparent complications.

ADDENDUM:
Pathology revealed BENIGN STROMAL CALCIFICATIONS of the Left breast,
upper outer quadrant. This was found to be concordant by Dr. Reuna
Jevaro.

Pathology results were discussed with the patient by telephone. The
patient reported doing well after the biopsy with tenderness at the
site. Post biopsy instructions and care were reviewed and questions
were answered. The patient was encouraged to call The [REDACTED]

The patient was instructed to return for annual screening
mammography which is due in Monday May, 2018. She was informed a
reminder notice would be sent regarding this appointment.

Pathology results reported by Renatas Bacchus, RN on 02/20/2018.

*** End of Addendum ***

## 2019-10-15 ENCOUNTER — Encounter: Payer: Self-pay | Admitting: Family Medicine

## 2019-10-15 ENCOUNTER — Encounter: Payer: Self-pay | Admitting: Internal Medicine

## 2019-10-16 ENCOUNTER — Encounter: Payer: Self-pay | Admitting: Internal Medicine

## 2019-10-16 ENCOUNTER — Other Ambulatory Visit: Payer: Self-pay | Admitting: Family Medicine

## 2019-10-19 ENCOUNTER — Ambulatory Visit (INDEPENDENT_AMBULATORY_CARE_PROVIDER_SITE_OTHER): Payer: Medicare Other | Admitting: Internal Medicine

## 2019-10-19 ENCOUNTER — Other Ambulatory Visit: Payer: Self-pay

## 2019-10-19 ENCOUNTER — Encounter: Payer: Self-pay | Admitting: Internal Medicine

## 2019-10-19 VITALS — BP 130/60 | HR 94 | Ht 70.0 in | Wt 178.6 lb

## 2019-10-19 DIAGNOSIS — E1065 Type 1 diabetes mellitus with hyperglycemia: Secondary | ICD-10-CM

## 2019-10-19 DIAGNOSIS — Z9884 Bariatric surgery status: Secondary | ICD-10-CM

## 2019-10-19 LAB — POCT GLYCOSYLATED HEMOGLOBIN (HGB A1C): Hemoglobin A1C: 6.9 % — AB (ref 4.0–5.6)

## 2019-10-19 MED ORDER — TRESIBA FLEXTOUCH 100 UNIT/ML ~~LOC~~ SOPN: 17.0000 [IU] | PEN_INJECTOR | Freq: Every day | SUBCUTANEOUS | 3 refills | Status: DC

## 2019-10-19 MED ORDER — INSULIN ASPART 100 UNIT/ML CARTRIDGE (PENFILL): SUBCUTANEOUS | 3 refills | Status: DC

## 2019-10-19 MED ORDER — BD PEN NEEDLE MINI U/F 31G X 5 MM MISC: 3 refills | Status: DC

## 2019-10-19 NOTE — Patient Instructions (Addendum)
-   Keep up the [place]! - Tresiba 17units daily  - Novolog 8 units with Breakfast , 12 units with Lunch and 9 units supper    Check sugar before you enter the pool, if your sugar is less than 150, please eat a protein bar     HOW TO TREAT LOW BLOOD SUGARS (Blood sugar LESS THAN 70 MG/DL)  Please follow the RULE OF 15 for the treatment of hypoglycemia treatment (when your (blood sugars are less than 70 mg/dL)    STEP 1: Take 15 grams of carbohydrates when your blood sugar is low, which includes:   3-4 GLUCOSE TABS  OR  3-4 OZ OF JUICE OR REGULAR SODA OR  ONE TUBE OF GLUCOSE GEL     STEP 2: RECHECK blood sugar in 15 MINUTES STEP 3: If your blood sugar is still low at the 15 minute recheck --> then, go back to STEP 1 and treat AGAIN with another 15 grams of carbohydrates.

## 2019-10-19 NOTE — Progress Notes (Signed)
8    Name: Joyce Brown  Age/ Sex: 69 y.o., female   MRN/ DOB: [id number], 10-07-1950     PCP: Lind Covert, MD   Reason for Endocrinology Evaluation: Type 1 Diabetes Mellitus  Initial Endocrine Consultative Visit: 01/17/2018    PATIENT IDENTIFIER: Joyce Brown is a 69 y.o. female with a past medical history of T1DM, cerebellar atrophy, asthma, OSA on CPAP and Hx of gastric bypass in 2014  . The patient has followed with Endocrinology clinic since 01/17/18 for consultative assistance with management of her diabetes.  Patient moved from [place] in September, 2019 to be close to her sister. She has been diagnosed with stiff person Syndrome but Dr. Jannifer Franklin thought this may be more of cerebellar atrophy. She was on  IVIG until 11/07/2018. 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 She has only had one DKA episode which was in 11/2017. Marland Kitchen Her hemoglobin A1c has ranged from 8.4% in 01/2017, peaking at 12.1 % in 01/2016  She is followed by Dr. 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 She has been off IVIG since 02/2018. He has been tapering her diazepam down and is currently on cellcept.    Started using the inpen 07/2018  SUBJECTIVE:   During the last visit (07/19/2019): A1c 7.5 % . We adjusted MDI regimen     Today (10/19/2019): Joyce Brown is here for a 3 months follow up visit on diabetes.  She checks her blood sugars multiple times daily, using  CGM (Dexcom). The patient has had hypoglycemic episodes since the last clinic visit, which typically occur multiple 3x / week- most often occuring after breakfast followed by over-correction.  The patient is symptomatic with these episodes.   HOME DIABETES REGIMEN:  Tresiba 17 units QHS Novolog 9 units  With Breakfast, 12 units with lunch and 9 units  with supper  CF (BG - 110/60)    CONTINUOUS GLUCOSE MONITORING RECORD INTERPRETATION    Dates of Recording:7/25-7/31/2021 Sensor description:Dexcom  Results statistics:   CGM use % of time 93  Average and SD 183/ 76  Time in range  79 %  % Time Above 180 2  % Time above 250 17  % Time Below target 1    Glycemic patterns summary: Hyperglycemia post prandial with tight/low BG 's in between meals. Hypoglycemic episodes occurred postprandial   Overnight periods: steady   IN-Pen report 7/22-10/17/2019  Average 182 Missed doses:  1 SD 70 TDD 50.2 units   HISTORY:  Past Medical History:  Past Medical History:  Diagnosis Date   Asthma    Cerebellar ataxia ([place])    Diabetes mellitus without complication ([place])    Gait abnormality 11/07/2018   Glaucoma    Hyperlipidemia    Hypertension    Pulmonary embolism ([place])    Sleep apnea    Spinal stenosis    Past Surgical History:  Past Surgical History:  Procedure Laterality Date   ABDOMINAL HYSTERECTOMY     ADENOIDECTOMY     ANKLE SURGERY Left    BILATERAL CARPAL TUNNEL RELEASE     BREAST BIOPSY Left 2019   benign   ELBOW SURGERY     GASTRIC BYPASS  2014   HAND NEUROPLASTY     TONSILLECTOMY      Social History:  reports that she has never smoked.  She has never used smokeless tobacco. She reports that she does not drink alcohol and does not use drugs. Family History:  Family History  Problem Relation Age of Onset   Cervical cancer Mother    Ovarian cancer Mother    Dementia Father    Retinal detachment Father    Heart attack Father    Diabetes Father    Stroke Father    Hypertension Father    Glaucoma Paternal Grandfather    Ovarian cancer Sister      HOME MEDICATIONS: Allergies as of 10/19/2019   No Known Allergies     Medication List       Accurate as of October 19, 2019  8:54 AM. If you have any questions, ask your nurse or doctor.        STOP taking these medications     Flovent HFA 220 MCG/ACT inhaler Generic drug: fluticasone Stopped by: Dorita Sciara, MD     TAKE these medications   acetaminophen 325 MG tablet Commonly known as: TYLENOL Take 650 mg by mouth every 6 (six) hours as needed for moderate pain.   atorvastatin 40 MG tablet Commonly known as: LIPITOR Take 1 tablet (40 mg total) by mouth daily at 6 PM.   B-D UF III MINI PEN NEEDLES 31G X 5 MM Misc Generic drug: Insulin Pen Needle Four times a day What changed:   how much to take  how to take this  when to take this  additional instructions   Calcium Citrate 250 MG Tabs Take 2 tablets (500 mg total) by mouth 2 (two) times daily.   Dexcom G6 Sensor Misc 1 each by Other route daily.   glucose blood test strip 1 each by Other route daily. ASCENSIA CONTOUR NEXT ONE: Use to check blood sugar   InPen 100-Pink-Novo Devi Generic drug: injection device for insulin As directed E10.65   insulin aspart cartridge Commonly known as: NOVOLOG Inject 8 Units into the skin daily with breakfast AND 12 Units daily with lunch AND 9 Units daily with supper. Max daily dose of 60 units. What changed: See the new instructions. Changed by: Dorita Sciara, MD   Insulin Syringe-Needle U-100 31G X 15/64" 0.5 ML Misc 1 Device by Does not apply route daily.   Microlet Lancets Misc Twice a day What changed:   how much to take  how to take this  when to take this  additional instructions   multivitamin capsule Take 4 capsules by mouth daily. Bariatric fusion   mycophenolate 500 MG tablet Commonly known as: CELLCEPT Take 2 tablets (1,000 mg total) by mouth 2 (two) times daily.   oxybutynin 5 MG tablet Commonly known as: DITROPAN TAKE ONE TABLET BY MOUTH TWICE A DAY   sertraline 50 MG tablet Commonly known as: ZOLOFT Take 1 tablet (50 mg total) by mouth daily. What changed:   how much to take  additional instructions   sertraline 50 MG tablet Commonly known as:  ZOLOFT Take 1 tablet (50 mg total) by mouth daily. What changed: Another medication with the same name was changed. Make sure you understand how and when to take each.   tiZANidine 2 MG tablet Commonly known as: ZANAFLEX TAKE ONE TO TWO TABLETS BY MOUTH AT BEDTIME AS NEEDED FOR MUSCLE SPASMS   Tresiba FlexTouch 100 UNIT/ML FlexTouch Pen Generic drug: insulin degludec Inject 0.17 mLs (17 Units total) into the skin daily.   Vitamin D3 75 MCG (3000 UT) Tabs Take 75 mcg by  mouth daily.       PHYSICAL EXAM: VS: BP 130/60    Pulse 94    Ht 5\' 10"  (1.778 m)    Wt 178 lb 9.6 oz (81 kg)    SpO2 97%    BMI 25.63 kg/m    EXAM: General: Pt appears well and is in NAD  Lungs: Clear with good BS bilat with no rales, rhonchi, or wheezes  Heart: Auscultation: RRR with normal S1 and S2, no gallops or murmurs  Extremities:  BL LE: no pretibial edema   Mental Status: Judgment, insight: intact Mood and affect: no depression, anxiety, or agitation   DM Foot Exam 04/19/2019 The skin of the feet is intact without sores or ulcerations. The pedal pulses are 2+ on right and 2+ on left. The sensation is intact to a screening 5.07, 10 gram monofilament bilaterally   DATA REVIEWED:  Lab Results  Component Value Date   HGBA1C 6.9 (A) 10/19/2019   HGBA1C 7.5 (A) 07/19/2019   HGBA1C 8.6 (A) 04/19/2019   Lab Results  Component Value Date   MICROALBUR 3.1 (H) 04/19/2019   LDLCALC 102 (H) 01/17/2019   CREATININE 0.82 05/14/2019       ASSESSMENT / PLAN / RECOMMENDATIONS:   1) Type 1 Diabetes Mellitus, Optimally  Controlled - Most recent A1c of 6.9 %. Goal A1c < 7.5 %.     - I have congratulated the pt on improved glycemic control  - I did notice hypoglycemia following breakfast, will reduce prandial dose for breakfast. She also swims after breakfast, pt advised to check BG before entering the pool, if BG < 150 , pt to eat a protein bar.  - Pt is sensitive to insulin and carbohydrate which is  not unusual in pt with autoimmune diabetes, but also makes it a bit challenging to reach optimal glucose readings, another contributing factor is her hx of gastric bypass which causes variability in absorption .    MEDICATIONS:  Continue Tresiba 17 units daily   Continue Novolog 8 units with breakfast, 12 units with lunch  , and 9 units with  supper   Continue  Sensitivity factor 60    EDUCATION / INSTRUCTIONS:  BG monitoring instructions: Patient is instructed to check her blood sugars 4 times a day with CGM caliberation.  Call [facility] if: BG persistently < 70   I reviewed the Rule of 15 for the treatment of hypoglycemia in detail with the patient.     2) Diabetic complications:   Eye: Does not have known diabetic retinopathy.   Neuro/ Feet: Does not have known diabetic peripheral neuropathy.  Renal: Patient does not have known baseline CKD. She is not on an ACEI/ARB at present. Normal microalbuminuria .    F/U in 4 months    Signed electronically by: Mack Guise, MD  [facility]  [address] [place], [place] [address] Phone: [phone] FAX: [phone]   CC: Lind Covert, [address] [place] [address] Phone: [phone]  Fax: [phone]  Return to Endocrinology clinic as below: Future Appointments  Date Time Provider [place]  10/26/2019 11:15 AM Clydell Hakim, RD [place] [facility]  10/29/2019  8:30 AM Marzetta Board, DPM [facility]  11/14/2019 10:00 AM Kathrynn Ducking, MD [facility] None  11/20/2019  8:50 AM Lind Covert, MD [facility]  01/29/2020  8:30 AM Marzetta Board, DPM [facility]  02/18/2020  9:50  AM Travoris Bushey, Melanie Crazier, MD [facility] None

## 2019-10-26 ENCOUNTER — Other Ambulatory Visit: Payer: Self-pay

## 2019-10-26 ENCOUNTER — Encounter: Payer: Medicare Other | Attending: Internal Medicine | Admitting: Dietician

## 2019-10-26 ENCOUNTER — Encounter: Payer: Self-pay | Admitting: Dietician

## 2019-10-26 DIAGNOSIS — E108 Type 1 diabetes mellitus with unspecified complications: Secondary | ICD-10-CM | POA: Insufficient documentation

## 2019-10-26 DIAGNOSIS — E1065 Type 1 diabetes mellitus with hyperglycemia: Secondary | ICD-10-CM | POA: Diagnosis not present

## 2019-10-26 DIAGNOSIS — IMO0002 Reserved for concepts with insufficient information to code with codable children: Secondary | ICD-10-CM

## 2019-10-26 NOTE — Progress Notes (Signed)
Diabetes Self-Management Education  Visit Type: Follow-up  Appt. Start Time: 1100 Appt. End Time: 1130  10/26/2019  [name], identified by name and date of birth, is a 70 y.o. female with a diagnosis of Diabetes:  .   ASSESSMENT Patient is here today alone and was last seen by myself on 09/25/2019.  She brought in her full reports from the In-Pen and Dexcom. 30 day Average Glucose 178 and Time in Range was 82% with 1% lows and 1% severe lows, 2% high and 14% very high. (July 14-August 12)  She has been going to the pool 5 days per week for 45-60 minutes.  She gets low occasionally after the pool and now eats a protein bar prior to getting in the pool if you blood glucose is lower than 150.  She states that her standing balance has improved since going to the pool.  Medications continued Tresiba 17 grams and Novolog based on In Pen (carb intake and blood glucose calculation).  She notes improved control on Tresiba.  Novolog 8 units with breakfast, 12 units with lunch, and 9 units with supper.  She continues to use the Dexcom G-6 and InPen.  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  Type 1 Diabetes since 2007, asthma, cerebellar ataxia, HTN, HLD, OSA on C-pap and sleep study Julyl 6. Noted swallow evaluation with recommendation of a Dysphagia 3 diet. Dexcom G-6 since 07/11/18. Inpen recently. Medication includes Tresiba 17 units q HS and Novolog 9 units before breakfast,12 units before lunch and 9 units before dinner. She is not aiming to give this 30 minutes prior to the meal which has improved her blood sugar control  24 hour diet recall:   Breakfast:  Frozen fruit, 1 banana less sugar yogurt, protein powder (sun warrior plant based), almond milk, water, baby carrots OR 1 boiled egg, 1 digestive biscuit, 1 Chobani ZeroSugar yogurt Protein bar prior to the pool if glucose is less than 150 POOL Occasional leftover smoothie at  10:30 Lunch:  Humus and tuna on fresh spinach, raw carrots or cauliflower or broccoli, fresh peach Dinner:  Bowl with beans, raw spinach, vege burger, onions, occasional boiled egg OR rotisserie chicken, potatoes or sweet potatoes, raw broccoli or spinach  Patient lives alone. She uses a wheel chair. She is doing her own grocery shopping. She uses [employer] for medical appointments but is able to wheel herself to the grocery store. 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 She moved here from [place] last year to be closer to one of her sister's who lives in [place]. The pool at her apartment is to reopen outdoors. Patient enjoys walking in the pool.  She last saw an RD in [place] about 11 months prior to my first visit.    Diabetes Self-Management Education - 10/26/19 1200      Visit Information   Visit Type Follow-up      Psychosocial Assessment   Patient Belief/Attitude about Diabetes Motivated to manage diabetes    Self-care barriers Unsteady gait/risk for falls    Self-management support Doctor's office;CDE visits    Other persons present Patient    Patient Concerns Nutrition/Meal planning    Special Needs None    Preferred Learning Style No preference indicated    Learning Readiness Ready      Pre-Education Assessment   Patient understands the diabetes disease and treatment process. Demonstrates understanding / competency    Patient understands  incorporating nutritional management into lifestyle. Needs Review    Patient undertands incorporating physical activity into lifestyle. Demonstrates understanding / competency    Patient understands using medications safely. Demonstrates understanding / competency    Patient understands monitoring blood glucose, interpreting and using results Demonstrates understanding / competency    Patient understands prevention, detection, and treatment of acute complications.  Demonstrates understanding / competency    Patient understands prevention, detection, and treatment of chronic complications. Demonstrates understanding / competency    Patient understands how to develop strategies to address psychosocial issues. Demonstrates understanding / competency    Patient understands how to develop strategies to promote health/change behavior. Needs Review      Complications   Last HgB A1C per patient/outside source 6.9 %   10/19/2019 decreased from 7.5%   How often do you check your blood sugar? > 4 times/day   dexcom G6   Fasting Blood glucose range (mg/dL) 70-129;130-179    Postprandial Blood glucose range (mg/dL) 180-200;>200    Number of hypoglycemic episodes per month 8    Can you tell when your blood sugar is low? Yes    What do you do if your blood sugar is low? juice    Number of hyperglycemic episodes per week 14    Can you tell when your blood sugar is high? Yes      Exercise   Exercise Type Moderate (swimming / aerobic walking)    How many days per week to you exercise? 5    How many minutes per day do you exercise? 60    Total minutes per week of exercise 300      Patient Education   Previous Diabetes Education Yes (please comment)   09/2019   Nutrition management  Food label reading, portion sizes and measuring food.;Meal options for control of blood glucose level and chronic complications.    Medications Reviewed patients medication for diabetes, action, purpose, timing of dose and side effects.    Monitoring Other (comment)   reviewed CGM reports   Psychosocial adjustment Worked with patient to identify barriers to care and solutions      Individualized Goals (developed by patient)   Nutrition General guidelines for healthy choices and portions discussed    Physical Activity Exercise 5-7 days per week;45 minutes per day    Medications take my medication as prescribed    Monitoring  test my blood glucose as discussed    Problem Solving carb  counting    Reducing Risk examine blood glucose patterns    Health Coping discuss diabetes with (comment)   MD, RD, CDCES     Patient Self-Evaluation of Goals - Patient rates self as meeting previously set goals (% of time)   Nutrition >75%    Physical Activity >75%    Medications >75%    Monitoring >75%    Problem Solving >75%    Reducing Risk >75%    Health Coping >75%      Post-Education Assessment   Patient understands the diabetes disease and treatment process. Demonstrates understanding / competency    Patient understands incorporating nutritional management into lifestyle. Needs Review    Patient undertands incorporating physical activity into lifestyle. Demonstrates understanding / competency    Patient understands using medications safely. Demonstrates understanding / competency    Patient understands monitoring blood glucose, interpreting and using results Demonstrates understanding / competency    Patient understands prevention, detection, and treatment of acute complications. Demonstrates understanding / competency    Patient  understands prevention, detection, and treatment of chronic complications. Demonstrates understanding / competency    Patient understands how to develop strategies to address psychosocial issues. Demonstrates understanding / competency    Patient understands how to develop strategies to promote health/change behavior. Demonstrates understanding / competency      Outcomes   Expected Outcomes Demonstrated interest in learning. Expect positive outcomes    Future DMSE 2 months    Program Status Not Completed      Subsequent Visit   Since your last visit have you continued or begun to take your medications as prescribed? Yes    Since your last visit have you had your blood pressure checked? Yes           Individualized Plan for Diabetes Self-Management Training:   Learning Objective:  Patient will have a greater understanding of diabetes  self-management. Patient education plan is to attend individual and/or group sessions per assessed needs and concerns.   Plan:   There are no Patient Instructions on file for this visit.  Expected Outcomes:  Demonstrated interest in learning. Expect positive outcomes  If problems or questions, patient to contact team via:  Phone and Email  Future DSME appointment: 2 months

## 2019-10-29 ENCOUNTER — Encounter: Payer: Self-pay | Admitting: Podiatry

## 2019-10-29 ENCOUNTER — Ambulatory Visit (INDEPENDENT_AMBULATORY_CARE_PROVIDER_SITE_OTHER): Payer: Medicare Other | Admitting: Podiatry

## 2019-10-29 ENCOUNTER — Other Ambulatory Visit: Payer: Self-pay

## 2019-10-29 DIAGNOSIS — M79675 Pain in left toe(s): Secondary | ICD-10-CM | POA: Diagnosis not present

## 2019-10-29 DIAGNOSIS — B351 Tinea unguium: Secondary | ICD-10-CM

## 2019-10-29 DIAGNOSIS — M79674 Pain in right toe(s): Secondary | ICD-10-CM | POA: Diagnosis not present

## 2019-10-29 NOTE — Addendum Note (Signed)
Addended by: Marzetta Board on: 10/29/2019 12:51 PM   Modules accepted: Orders

## 2019-10-29 NOTE — Progress Notes (Addendum)
Subjective: Joyce Brown presents today preventative diabetic foot care and painful mycotic nails b/l that are difficult to trim. Pain interferes with ambulation. Aggravating factors include wearing enclosed shoe gear. Pain is relieved with periodic professional debridement.  Lind Covert, MD is patient's PCP. Last visit was: 10/15/2019.  She voices no new pedal problems on today's visit. She states she did attempt to trim her left great toenail with large nail nippers. She also reports improvement in A1c since last visit and attributes it to walking in the pool a couple of times per week.  Past Medical History:  Diagnosis Date  . Asthma   . Cerebellar ataxia ([place])   . Diabetes mellitus without complication ([place])   . Gait abnormality 11/07/2018  . Glaucoma   . Hyperlipidemia   . Hypertension   . Pulmonary embolism ([place])   . Sleep apnea   . Spinal stenosis      Current Outpatient Medications on File Prior to Visit  Medication Sig Dispense Refill  . acetaminophen (TYLENOL) 325 MG tablet Take 650 mg by mouth every 6 (six) hours as needed for moderate pain.     Marland Kitchen atorvastatin (LIPITOR) 40 MG tablet Take 1 tablet (40 mg total) by mouth daily at 6 PM. 90 tablet 3  . Calcium Citrate 250 MG TABS Take 2 tablets (500 mg total) by mouth 2 (two) times daily. 120 tablet 11  . Cholecalciferol (VITAMIN D3) 75 MCG (3000 UT) TABS Take 75 mcg by mouth daily.     . Continuous Blood Gluc Sensor (DEXCOM G6 SENSOR) MISC 1 each by Other route daily.     Marland Kitchen glucose blood test strip 1 each by Other route daily. ASCENSIA CONTOUR NEXT ONE: Use to check blood sugar    . injection device for insulin (INPEN 100-PINK-NOVO) DEVI As directed E10.65 1 each 0  . insulin aspart (NOVOLOG) cartridge Inject 8 Units into the skin daily with breakfast AND 12 Units daily with lunch AND 9 Units daily with supper. Max daily dose of 60 units. 45 mL 3  . insulin degludec (TRESIBA FLEXTOUCH) 100 UNIT/ML FlexTouch Pen  Inject 0.17 mLs (17 Units total) into the skin daily. 15 mL 3  . Insulin Pen Needle (B-D UF III MINI PEN NEEDLES) 31G X 5 MM MISC Four times a day 400 each 3  . Insulin Syringe-Needle U-100 31G X 15/64" 0.5 ML MISC 1 Device by Does not apply route daily. 100 each 6  . MICROLET LANCETS MISC Twice a day (Patient taking differently: 1 each by Other route 2 (two) times daily. ) 100 each 11  . Multiple Vitamin (MULTIVITAMIN) capsule Take 4 capsules by mouth daily. Bariatric fusion     . mycophenolate (CELLCEPT) 500 MG tablet Take 2 tablets (1,000 mg total) by mouth 2 (two) times daily. 360 tablet 3  . oxybutynin (DITROPAN) 5 MG tablet TAKE ONE TABLET BY MOUTH TWICE A DAY 180 tablet 3  . sertraline (ZOLOFT) 50 MG tablet Take 1 tablet (50 mg total) by mouth daily. 90 tablet 1  . tiZANidine (ZANAFLEX) 2 MG tablet TAKE ONE TO TWO TABLETS BY MOUTH AT BEDTIME AS NEEDED FOR MUSCLE SPASMS 60 tablet 2  . [DISCONTINUED] fluticasone (FLOVENT HFA) 220 MCG/ACT inhaler Inhale 1 puff into the lungs 2 (two) times daily. 3 Inhaler 4   No current facility-administered medications on file prior to visit.     No Known Allergies  Objective: Joyce Brown is a pleasant 69 y.o.  Caucasian female WD,  WN in NAD.  AAO x 3.  There were no vitals filed for this visit.  Neurovascular status intact and unchanged b/l.  Vascular Examination: Capillary refill time immediate x 10 digits. Dorsalis pedis present b/l. Posterior tibial pulses present b/l. Digital hair absent b/l. Skin temperature gradient WNL b/l. No edema b/l LE. No pain with calf compression b/l LE. Protective sensation intact with 10 gram monofilament bilaterally. Epicritic sensation present bilaterally. Vibratory sensation intact bilaterally.  Dermatological Examination: Pedal skin with normal turgor, texture and tone bilaterally. No open wounds bilaterally. No interdigital macerations bilaterally. Toenails 1-5 b/l elongated, dystrophic, thickened,  crumbly with subungual debris and tenderness to dorsal palpation.  Musculoskeletal: Normal muscle strength 5/5 to all lower extremity muscle groups bilaterally. No pain crepitus or joint limitation noted with ROM b/l. Hallux valgus with bunion deformity noted b/l. Hammertoes noted to the 2-5 bilaterally. Utilizes wheelchair for mobility assistance.   Assessment: 1. Pain due to onychomycosis of toenails of both feet    Plan: -Examined patient. -No new findings. No new orders. -Continue diabetic foot care principles.   -Toenails 1-5 b/l were debrided in length and girth with sterile nail nippers and dremel without iatrogenic bleeding.  -Patient to continue soft, supportive shoe gear daily. -Patient to report any pedal injuries to medical professional immediately. -Patient/POA to call should there be question/concern in the interim.  Return in about 3 months (around 01/29/2020).  Marzetta Board, DPM

## 2019-11-01 ENCOUNTER — Other Ambulatory Visit: Payer: Self-pay | Admitting: Family Medicine

## 2019-11-01 ENCOUNTER — Telehealth: Payer: Self-pay | Admitting: Orthotics

## 2019-11-01 NOTE — Telephone Encounter (Signed)
Called patient.Marland Kitchenadvised we would hold off on making any shoe appointments until mid September; she was fine with that.

## 2019-11-02 ENCOUNTER — Other Ambulatory Visit: Payer: Medicare Other | Admitting: Orthotics

## 2019-11-14 ENCOUNTER — Ambulatory Visit (INDEPENDENT_AMBULATORY_CARE_PROVIDER_SITE_OTHER): Payer: Medicare Other | Admitting: Neurology

## 2019-11-14 ENCOUNTER — Encounter: Payer: Self-pay | Admitting: Neurology

## 2019-11-14 ENCOUNTER — Other Ambulatory Visit: Payer: Self-pay

## 2019-11-14 VITALS — BP 159/78 | HR 61 | Ht 70.0 in | Wt 178.0 lb

## 2019-11-14 DIAGNOSIS — Z5181 Encounter for therapeutic drug level monitoring: Secondary | ICD-10-CM

## 2019-11-14 DIAGNOSIS — R269 Unspecified abnormalities of gait and mobility: Secondary | ICD-10-CM

## 2019-11-14 DIAGNOSIS — G119 Hereditary ataxia, unspecified: Secondary | ICD-10-CM

## 2019-11-14 NOTE — Progress Notes (Signed)
Reason for visit: Cerebellar ataxia, antigad antibodies  Joyce Brown is an 69 y.o. 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  The patient currently is on CellCept, she is tolerating this fairly well.  She has had her Port-A-Cath removed.  She has been quite stable with her cerebellar syndrome.  She is starting to exercise in the swimming pool on a regular basis and has noted a drop in her insulin needs because of this.  Her hemoglobin A1c is running 6.9.  She has had occasional falls, several of them were secondary to episodes of hypoglycemia.  She is doing better now that she has adjusted her insulin dose.  She was seen in May 2021 after she cut her left index finger on a box cutter.  Overall, she believes that she is doing well.  Past Medical History:  Diagnosis Date  . Asthma   . Cerebellar ataxia ([place])   . Diabetes mellitus without complication (Joyce Brown)   . Gait abnormality 11/07/2018  . Glaucoma   . Hyperlipidemia   . Hypertension   . Pulmonary embolism ([place])   . Sleep apnea   . Spinal stenosis     Past Surgical History:  Procedure Laterality Date  . ABDOMINAL HYSTERECTOMY    . ADENOIDECTOMY    . ANKLE SURGERY Left   . BILATERAL CARPAL TUNNEL RELEASE    . BREAST BIOPSY Left 2019   benign  . ELBOW SURGERY    . GASTRIC BYPASS  2014  . HAND NEUROPLASTY    . TONSILLECTOMY      Family History  Problem Relation Age of Onset  . Cervical cancer Mother   . Ovarian cancer Mother   . Dementia Father   . Retinal detachment Father   . Heart attack Father   . Diabetes Father   . Stroke Father   . Hypertension Father   . Glaucoma Paternal Grandfather   . Ovarian cancer Sister     Social history:  reports that she has never smoked. She has never used smokeless tobacco. She reports that she does not drink alcohol and does not use drugs.   No  Known Allergies  Medications:  Prior to Admission medications   Medication Sig Start Date End Date Taking? Authorizing Provider  acetaminophen (TYLENOL) 325 MG tablet Take 650 mg by mouth every 6 (six) hours as needed for moderate pain.    Yes [provider]  atorvastatin (LIPITOR) 40 MG tablet Take 1 tablet (40 mg total) by mouth daily at 6 PM. 09/19/19  Yes Chambliss, Jeb Levering, MD  Calcium Citrate 250 MG TABS Take 2 tablets (500 mg total) by mouth 2 (two) times daily. 01/17/18  Yes Shamleffer, Melanie Crazier, MD  Cholecalciferol (VITAMIN D3) 75 MCG (3000 UT) TABS Take 75 mcg by mouth daily.    Yes [provider]  Continuous Blood Gluc Sensor (DEXCOM G6 SENSOR) MISC 1 each by Other route daily.    Yes [provider]  glucose blood test strip 1 each by Other route daily. ASCENSIA CONTOUR NEXT ONE: Use to check blood sugar   Yes [provider]  injection device for insulin (INPEN 100-PINK-NOVO) DEVI As directed E10.65 05/03/19  Yes Shamleffer, Melanie Crazier, MD  insulin aspart (NOVOLOG) cartridge Inject 8 Units into the skin daily with breakfast AND 12 Units daily with lunch AND 9 Units daily with supper.  Max daily dose of 60 units. 10/19/19  Yes Shamleffer, Melanie Crazier, MD  insulin degludec (TRESIBA FLEXTOUCH) 100 UNIT/ML FlexTouch Pen Inject 0.17 mLs (17 Units total) into the skin daily. 10/19/19  Yes Shamleffer, Melanie Crazier, MD  Insulin Pen Needle (B-D UF III MINI PEN NEEDLES) 31G X 5 MM MISC Four times a day 10/19/19  Yes Shamleffer, Melanie Crazier, MD  Insulin Syringe-Needle U-100 31G X 15/64" 0.5 ML MISC 1 Device by Does not apply route daily. 04/19/19  Yes Shamleffer, Melanie Crazier, MD  MICROLET LANCETS MISC Twice a day Patient taking differently: 1 each by Other route 2 (two) times daily.  02/07/18  Yes Shamleffer, Melanie Crazier, MD  Multiple Vitamin (MULTIVITAMIN) capsule Take 4 capsules by mouth daily. Bariatric fusion    Yes [provider]  mycophenolate (CELLCEPT) 500 MG tablet Take 2 tablets (1,000 mg total) by mouth 2 (two) times daily. 07/09/19  Yes Kathrynn Ducking, MD  oxybutynin (DITROPAN) 5 MG tablet TAKE ONE TABLET BY MOUTH TWICE A DAY 07/11/19  Yes Chambliss, Jeb Levering, MD  sertraline (ZOLOFT) 50 MG tablet Take 1 tablet (50 mg total) by mouth daily. 10/17/19  Yes Chambliss, Jeb Levering, MD  tiZANidine (ZANAFLEX) 2 MG tablet TAKE ONE TO TWO TABLETS BY MOUTH AT BEDTIME AS NEEDED FOR MUSCLE SPASMS 11/01/19  Yes Chambliss, Jeb Levering, MD  fluticasone (FLOVENT HFA) 220 MCG/ACT inhaler Inhale 1 puff into the lungs 2 (two) times daily. 03/20/18   Lind Covert, MD    ROS:  Out of a complete 14 system review of symptoms, the patient complains only of the following symptoms, and all other reviewed systems are negative.  Walking difficulty  Blood pressure (!) 159/78, pulse 61, height 5\' 10"  (1.778 m), weight 178 lb (80.7 kg).  Physical Exam  General: The patient is alert and cooperative at the time of the examination.  Skin: No significant peripheral edema is noted.   Neurologic Exam  Mental status: The patient is alert and oriented x 3 at the time of the examination. The patient has apparent normal recent and remote memory, with an apparently normal attention span and concentration ability.   Cranial nerves: Facial symmetry is present. Speech is normal, no aphasia or dysarthria is noted. Extraocular movements are full. Visual fields are full.  Motor: The patient has good strength in all 4 extremities.  Sensory examination: Soft touch sensation is symmetric on the face, arms, and legs.  Coordination: The patient has fairly good finger-nose-finger with only minimal dysmetria, she does have some dysmetria with heel-to-shin bilaterally.  Gait and station: The patient has a wide-based, ataxic gait.  She can walk with assistance.  Reflexes: Deep tendon reflexes are  symmetric.   Assessment/Plan:  1.  Cerebellar ataxia, Anti-GAD antibodies  2.  Gait disturbance  The patient will continue the CellCept for now, we will check blood work today.  She will follow-up here in 6 months.  Jill Alexanders MD 11/14/2019 10:13 AM  [facility] [address] [place], [place] [address]  Phone [phone] Fax [phone]

## 2019-11-15 ENCOUNTER — Encounter: Payer: Self-pay | Admitting: Internal Medicine

## 2019-11-15 LAB — CBC WITH DIFFERENTIAL/PLATELET
Basophils Absolute: 0.1 10*3/uL (ref 0.0–0.2)
Basos: 1 %
EOS (ABSOLUTE): 0.2 10*3/uL (ref 0.0–0.4)
Eos: 3 %
Hematocrit: 38.9 % (ref 34.0–46.6)
Hemoglobin: 12.1 g/dL (ref 11.1–15.9)
Immature Grans (Abs): 0 10*3/uL (ref 0.0–0.1)
Immature Granulocytes: 0 %
Lymphocytes Absolute: 1.4 10*3/uL (ref 0.7–3.1)
Lymphs: 21 %
MCH: 29.3 pg (ref 26.6–33.0)
MCHC: 31.1 g/dL — ABNORMAL LOW (ref 31.5–35.7)
MCV: 94 fL (ref 79–97)
Monocytes Absolute: 0.6 10*3/uL (ref 0.1–0.9)
Monocytes: 8 %
Neutrophils Absolute: 4.6 10*3/uL (ref 1.4–7.0)
Neutrophils: 67 %
Platelets: 221 10*3/uL (ref 150–450)
RBC: 4.13 x10E6/uL (ref 3.77–5.28)
RDW: 13.1 % (ref 11.7–15.4)
WBC: 6.8 10*3/uL (ref 3.4–10.8)

## 2019-11-15 LAB — COMPREHENSIVE METABOLIC PANEL
ALT: 18 IU/L (ref 0–32)
AST: 19 IU/L (ref 0–40)
Albumin/Globulin Ratio: 2.3 — ABNORMAL HIGH (ref 1.2–2.2)
Albumin: 4.3 g/dL (ref 3.8–4.8)
Alkaline Phosphatase: 55 IU/L (ref 48–121)
BUN/Creatinine Ratio: 33 — ABNORMAL HIGH (ref 12–28)
BUN: 28 mg/dL — ABNORMAL HIGH (ref 8–27)
Bilirubin Total: 0.3 mg/dL (ref 0.0–1.2)
CO2: 24 mmol/L (ref 20–29)
Calcium: 10.3 mg/dL (ref 8.7–10.3)
Chloride: 108 mmol/L — ABNORMAL HIGH (ref 96–106)
Creatinine, Ser: 0.84 mg/dL (ref 0.57–1.00)
GFR calc Af Amer: 83 mL/min/{1.73_m2} (ref >59)
GFR calc non Af Amer: 72 mL/min/{1.73_m2} (ref >59)
Globulin, Total: 1.9 g/dL (ref 1.5–4.5)
Glucose: 193 mg/dL — ABNORMAL HIGH (ref 65–99)
Potassium: 5.5 mmol/L — ABNORMAL HIGH (ref 3.5–5.2)
Sodium: 145 mmol/L — ABNORMAL HIGH (ref 134–144)
Total Protein: 6.2 g/dL (ref 6.0–8.5)

## 2019-11-20 ENCOUNTER — Other Ambulatory Visit: Payer: Self-pay

## 2019-11-20 ENCOUNTER — Ambulatory Visit (INDEPENDENT_AMBULATORY_CARE_PROVIDER_SITE_OTHER): Payer: Medicare Other | Admitting: Family Medicine

## 2019-11-20 ENCOUNTER — Encounter: Payer: Self-pay | Admitting: Family Medicine

## 2019-11-20 VITALS — BP 160/72 | HR 83 | Wt 179.6 lb

## 2019-11-20 DIAGNOSIS — E876 Hypokalemia: Secondary | ICD-10-CM | POA: Diagnosis not present

## 2019-11-20 DIAGNOSIS — Z23 Encounter for immunization: Secondary | ICD-10-CM | POA: Diagnosis not present

## 2019-11-20 DIAGNOSIS — E1065 Type 1 diabetes mellitus with hyperglycemia: Secondary | ICD-10-CM | POA: Diagnosis not present

## 2019-11-20 DIAGNOSIS — F329 Major depressive disorder, single episode, unspecified: Secondary | ICD-10-CM | POA: Diagnosis not present

## 2019-11-20 DIAGNOSIS — J4520 Mild intermittent asthma, uncomplicated: Secondary | ICD-10-CM

## 2019-11-20 DIAGNOSIS — IMO0002 Reserved for concepts with insufficient information to code with codable children: Secondary | ICD-10-CM

## 2019-11-20 DIAGNOSIS — E108 Type 1 diabetes mellitus with unspecified complications: Secondary | ICD-10-CM

## 2019-11-20 NOTE — Assessment & Plan Note (Signed)
Off all inhalers without any respiratory symptoms

## 2019-11-20 NOTE — Assessment & Plan Note (Signed)
Followed by endocrine with good control and lessening hypoglycemic episodes

## 2019-11-20 NOTE — Patient Instructions (Signed)
Great to see you  You seem to be doing great - very good idea about the pool - keep it up  I will call you if your tests are not good.  Otherwise, I will send you a message on MyChart (if it is active) or a letter in the mail..  If you do not hear from me with in 2 weeks please call our office.     Let me know if the stress of moving etc is getting too much  Have a wonderful rest of 2021

## 2019-11-20 NOTE — Assessment & Plan Note (Signed)
>>  ASSESSMENT AND PLAN FOR ASTHMA WRITTEN ON 11/20/2019  2:20 PM BY Torrin Crihfield L, MD  Off all inhalers without any respiratory symptoms

## 2019-11-20 NOTE — Assessment & Plan Note (Signed)
Stable.  Tried to wean completely off sertraline but she felt more irritable.  Better now back taking 50 mg daily

## 2019-11-20 NOTE — Progress Notes (Signed)
    SUBJECTIVE:   CHIEF COMPLAINT / HPI:   Here for general physical.  Feels is overall doing well. Is exercising 4-5 days a week by swimming.    HYPERKALEMIA  Recent blood work by Dr Jannifer Franklin who she sees for cerebellar ataxia showed elevated K of 5.5.  She is having intermittent muscle cramps but no palpitations   PERTINENT  PMH / PSH: Getting a new apartment.  Has 2 cats   ROS - no new symptoms   OBJECTIVE:   BP (!) 160/72   Pulse 83   Wt 179 lb 9.6 oz (81.5 kg)   SpO2 98%   BMI 25.77 kg/m   Psych:  Cognition and judgment appear intact. Alert, communicative  and cooperative with normal attention span and concentration. No apparent delusions, illusions, hallucinations Heart - Regular rate and rhythm.  No murmurs, gallops or rubs.    Lungs:  Normal respiratory effort, chest expands symmetrically. Lungs are clear to auscultation, no crackles or wheezes. No pedal edema Abdomen: soft and non-tender without masses, organomegaly or hernias noted.  No guarding or rebound   ASSESSMENT/PLAN:   Current on HM and exercising regularly  Asthma Off all inhalers without any respiratory symptoms   Type I diabetes mellitus with complication, uncontrolled ([place]) Followed by endocrine with good control and lessening hypoglycemic episodes   Depression Stable.  Tried to wean completely off sertraline but she felt more irritable.  Better now back taking 50 mg daily  Hypokalemia Fluctuating K levels.  Recheck today      Lind Covert, MD [place]

## 2019-11-20 NOTE — Assessment & Plan Note (Signed)
Fluctuating K levels.  Recheck today

## 2019-11-21 LAB — BASIC METABOLIC PANEL
BUN/Creatinine Ratio: 28 (ref 12–28)
BUN: 24 mg/dL (ref 8–27)
CO2: 22 mmol/L (ref 20–29)
Calcium: 9.6 mg/dL (ref 8.7–10.3)
Chloride: 105 mmol/L (ref 96–106)
Creatinine, Ser: 0.87 mg/dL (ref 0.57–1.00)
GFR calc Af Amer: 80 mL/min/{1.73_m2} (ref >59)
GFR calc non Af Amer: 69 mL/min/{1.73_m2} (ref >59)
Glucose: 296 mg/dL — ABNORMAL HIGH (ref 65–99)
Potassium: 4.6 mmol/L (ref 3.5–5.2)
Sodium: 141 mmol/L (ref 134–144)

## 2019-11-29 DIAGNOSIS — H40013 Open angle with borderline findings, low risk, bilateral: Secondary | ICD-10-CM | POA: Diagnosis not present

## 2019-12-14 ENCOUNTER — Encounter: Payer: Self-pay | Admitting: Family Medicine

## 2019-12-14 ENCOUNTER — Encounter: Payer: Self-pay | Admitting: Internal Medicine

## 2019-12-21 ENCOUNTER — Encounter: Payer: Medicare Other | Attending: Internal Medicine | Admitting: Dietician

## 2019-12-21 ENCOUNTER — Encounter: Payer: Self-pay | Admitting: Dietician

## 2019-12-21 ENCOUNTER — Other Ambulatory Visit: Payer: Self-pay

## 2019-12-21 DIAGNOSIS — E108 Type 1 diabetes mellitus with unspecified complications: Secondary | ICD-10-CM | POA: Insufficient documentation

## 2019-12-21 DIAGNOSIS — E1065 Type 1 diabetes mellitus with hyperglycemia: Secondary | ICD-10-CM | POA: Diagnosis not present

## 2019-12-21 DIAGNOSIS — IMO0002 Reserved for concepts with insufficient information to code with codable children: Secondary | ICD-10-CM

## 2019-12-21 NOTE — Patient Instructions (Signed)
Quality Sleep Consistent meal times Be sure to drink enough water daily. Continue your activity.  The [place] sounds great!  Continue your changes!

## 2019-12-21 NOTE — Progress Notes (Signed)
Diabetes Self-Management Education  Visit Type:  Follow-up  Appt. Start Time: 0840 Appt. End Time: 9937  12/23/2019  Joyce Brown, identified by name and date of birth, is a 69 y.o. female with a diagnosis of Diabetes:  .    ASSESSMENT Patient is here today alone. She brought her InPen report. Her average glucose is 182.  Hypoglycemia less than 70 on 4 of 11 days (never severe lows and patient always keeps something to treat these with her). She averaged 47.7 units of insulin dailly. She noted when she had a Lobster Roll from a food truck that her blood sugar rose >400.  She also delayed the Novolog for that meal. She now takes 0.5-2 units of insulin before bed when her blood glucose is >200.  She went to the [facility] and signed up for a membership. She has been using the pool at her complex less due to cooler temperatures.  Recently did a virtual cooking class with her apartment complex. She noted that the quality of sleep has an impact on her blood sugar. Blood sugar is higher on days that she has appointments.  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  Type 1 Diabetes since 2007, asthma, cerebellar ataxia, HTN, HLD, OSA on C-pap and sleep study Julyl 6. Noted swallow evaluation with recommendation of a Dysphagia 3 diet. Dexcom G-6 since 07/11/18. Inpen  Medication includesTresiba 17 units q HSand Novolog9 units before breakfast,12 units before lunch and 9 units before dinner.She is not aiming to give this 30 minutes prior to the meal which has improved her blood sugar control  Patient lives alone. She uses a wheel chair. She is doing her own grocery shopping. She uses [facility] for medical appointments but is able to wheel herself to the grocery store. 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 She moved here from [place] last year to be closer to  one of her sister's who lives in [place]. The pool at her apartment is to reopen outdoors. Patient enjoys walking in the pool.  She last saw an RD in [place] about 11 monthsprior to my first visit.  24 hour diet recall: Breakfast:  Yogurt, 2 whole grain crackers, hard boiled egg OR protein shake. Lunch:  Pasta primevera Snack:  Occasional fresh apple Dinner:  Rotisserie Chicken, potatoes or sweet potatoes Beverages:  Water, plain seltzer water  Exercise:  Had been "walking" around the property.  Plans to use the [facility] 3 times weekly.  She notes continued increased strength.  There were no vitals taken for this visit. There is no height or weight on file to calculate BMI.    Diabetes Self-Management Education - 12/23/19 2100      Psychosocial Assessment   Patient Belief/Attitude about Diabetes Motivated to manage diabetes    Self-care barriers Unsteady gait/risk for falls;Other (comment)   dexterity/strength issues making preparing food difficult   Patient Concerns Nutrition/Meal planning;Glycemic Control    Special Needs None    Preferred Learning Style No preference indicated    Learning Readiness Ready      Pre-Education Assessment   Patient understands the diabetes disease and treatment process. Demonstrates understanding / competency    Patient understands incorporating nutritional management into lifestyle. Needs Review    Patient undertands incorporating physical activity into lifestyle. Demonstrates understanding / competency    Patient understands using medications safely. Demonstrates understanding / competency    Patient understands monitoring blood  glucose, interpreting and using results Demonstrates understanding / competency    Patient understands prevention, detection, and treatment of acute complications. Demonstrates understanding / competency    Patient understands prevention, detection, and treatment of chronic complications. Demonstrates  understanding / competency    Patient understands how to develop strategies to address psychosocial issues. Demonstrates understanding / competency    Patient understands how to develop strategies to promote health/change behavior. Needs Review      Complications   How often do you check your blood sugar? > 4 times/day    Fasting Blood glucose range (mg/dL) 70-129;130-179;180-200    Postprandial Blood glucose range (mg/dL) 130-179;180-200;>200    Number of hypoglycemic episodes per month 8    Can you tell when your blood sugar is low? Yes    What do you do if your blood sugar is low? juice    Number of hyperglycemic episodes per week 14    Can you tell when your blood sugar is high? Yes      Individualized Goals (developed by patient)   Nutrition General guidelines for healthy choices and portions discussed    Physical Activity Exercise 5-7 days per week;30 minutes per day    Medications take my medication as prescribed    Monitoring  test my blood glucose as discussed    Problem Solving carb counting    Reducing Risk examine blood glucose patterns    Health Coping discuss diabetes with (comment)      Patient Self-Evaluation of Goals - Patient rates self as meeting previously set goals (% of time)   Nutrition >75%    Physical Activity >75%    Medications >75%    Monitoring >75%    Problem Solving >75%    Reducing Risk >75%    Health Coping >75%      Post-Education Assessment   Patient understands the diabetes disease and treatment process. Demonstrates understanding / competency    Patient understands incorporating nutritional management into lifestyle. Needs Review    Patient undertands incorporating physical activity into lifestyle. Demonstrates understanding / competency    Patient understands using medications safely. Demonstrates understanding / competency    Patient understands monitoring blood glucose, interpreting and using results Demonstrates understanding / competency     Patient understands prevention, detection, and treatment of acute complications. Demonstrates understanding / competency    Patient understands prevention, detection, and treatment of chronic complications. Demonstrates understanding / competency    Patient understands how to develop strategies to address psychosocial issues. Demonstrates understanding / competency    Patient understands how to develop strategies to promote health/change behavior. Demonstrates understanding / competency      Outcomes   Program Status Not Completed      Subsequent Visit   Since your last visit have you continued or begun to take your medications as prescribed? Yes    Since your last visit have you had your blood pressure checked? Yes           Learning Objective:  Patient will have a greater understanding of diabetes self-management. Patient education plan is to attend individual and/or group sessions per assessed needs and concerns.   Plan:   Patient Instructions  Quality Sleep Consistent meal times Be sure to drink enough water daily. Continue your activity.  The [place] sounds great!  Continue your changes!    Expected Outcomes:  Demonstrated interest in learning. Expect positive outcomes  Education material provided:   If problems or questions, patient to contact team  via:  Phone  Future DSME appointment: - 3-4 months

## 2019-12-31 ENCOUNTER — Encounter: Payer: Self-pay | Admitting: Family Medicine

## 2019-12-31 MED ORDER — SERTRALINE HCL 50 MG PO TABS: 50.0000 mg | ORAL_TABLET | Freq: Every day | ORAL | 1 refills | Status: DC

## 2020-01-10 ENCOUNTER — Encounter: Payer: Self-pay | Admitting: Internal Medicine

## 2020-01-10 DIAGNOSIS — E669 Obesity, unspecified: Secondary | ICD-10-CM | POA: Diagnosis not present

## 2020-01-10 DIAGNOSIS — K912 Postsurgical malabsorption, not elsewhere classified: Secondary | ICD-10-CM | POA: Diagnosis not present

## 2020-01-10 DIAGNOSIS — R69 Illness, unspecified: Secondary | ICD-10-CM | POA: Diagnosis not present

## 2020-01-10 DIAGNOSIS — Z9884 Bariatric surgery status: Secondary | ICD-10-CM | POA: Diagnosis not present

## 2020-01-21 ENCOUNTER — Encounter: Payer: Self-pay | Admitting: Internal Medicine

## 2020-01-23 ENCOUNTER — Other Ambulatory Visit: Payer: Self-pay

## 2020-01-23 ENCOUNTER — Ambulatory Visit (INDEPENDENT_AMBULATORY_CARE_PROVIDER_SITE_OTHER): Payer: Medicare Other

## 2020-01-23 DIAGNOSIS — Z23 Encounter for immunization: Secondary | ICD-10-CM

## 2020-01-23 NOTE — Progress Notes (Signed)
° °  Covid-19 Vaccination Clinic  Name:  Joyce Brown    MRN: [id number] DOB: December 18, 1950  01/23/2020  [name] was observed post Covid-19 immunization for 15 minutes without incident. She was provided with Vaccine Information Sheet and instruction to access the V-Safe system.   [name] was instructed to call 911 with any severe reactions post vaccine:  Difficulty breathing   Swelling of face and throat   A fast heartbeat   A bad rash all over body   Dizziness and weakness   Booster administered LD without complication.

## 2020-01-29 ENCOUNTER — Ambulatory Visit: Payer: Medicare Other | Admitting: Orthotics

## 2020-01-29 ENCOUNTER — Encounter: Payer: Self-pay | Admitting: Podiatry

## 2020-01-29 ENCOUNTER — Other Ambulatory Visit: Payer: Self-pay

## 2020-01-29 ENCOUNTER — Ambulatory Visit (INDEPENDENT_AMBULATORY_CARE_PROVIDER_SITE_OTHER): Payer: Medicare Other | Admitting: Podiatry

## 2020-01-29 DIAGNOSIS — B351 Tinea unguium: Secondary | ICD-10-CM

## 2020-01-29 DIAGNOSIS — M79674 Pain in right toe(s): Secondary | ICD-10-CM

## 2020-01-29 DIAGNOSIS — E1065 Type 1 diabetes mellitus with hyperglycemia: Secondary | ICD-10-CM

## 2020-01-29 DIAGNOSIS — M79675 Pain in left toe(s): Secondary | ICD-10-CM

## 2020-01-29 DIAGNOSIS — E108 Type 1 diabetes mellitus with unspecified complications: Secondary | ICD-10-CM

## 2020-01-29 DIAGNOSIS — IMO0002 Reserved for concepts with insufficient information to code with codable children: Secondary | ICD-10-CM

## 2020-01-29 NOTE — Progress Notes (Signed)
Patient had appointment today for definitive and final diabetic shoe fitting and delivery.  Patient was seen by Benjie Karvonen, C.Ped, [facility].   The inserts fit well and accomplished full contact with the plantar surface of the foot bilateral; the shoes fit well and offered forefoot freedom, no noticible heel slippage, and good toe clearance w/ the insert in place.  Patient was advised to monitor of any skin irritation, breakdown.  Patient was satisfied with fit and function.  Patient chose same shoe as last year.

## 2020-01-31 NOTE — Progress Notes (Signed)
Subjective: Joyce Brown presents today preventative diabetic foot care and painful mycotic nails b/l that are difficult to trim. Pain interferes with ambulation. Aggravating factors include wearing enclosed shoe gear. Pain is relieved with periodic professional debridement.  Lind Covert, MD is patient's PCP. Last visit was: 11/20/2019.  She states her blood glucose was 155 mg/dl this morning.  She relates she did join the [employer] and is enjoying the classes. She states her A1c is now 6.9 and attributes it to exercising in the pool.  Past Medical History:  Diagnosis Date  . Asthma   . Cerebellar ataxia ([place])   . Diabetes mellitus without complication ([place])   . Gait abnormality 11/07/2018  . Glaucoma   . Hyperlipidemia   . Hypertension   . Pulmonary embolism ([place])   . Sleep apnea   . Spinal stenosis      Current Outpatient Medications on File Prior to Visit  Medication Sig Dispense Refill  . acetaminophen (TYLENOL) 325 MG tablet Take 650 mg by mouth every 6 (six) hours as needed for moderate pain.     Marland Kitchen atorvastatin (LIPITOR) 40 MG tablet Take 1 tablet (40 mg total) by mouth daily at 6 PM. 90 tablet 3  . Calcium Citrate 250 MG TABS Take 2 tablets (500 mg total) by mouth 2 (two) times daily. 120 tablet 11  . Cholecalciferol (VITAMIN D3) 75 MCG (3000 UT) TABS Take 75 mcg by mouth daily.     . Continuous Blood Gluc Sensor (DEXCOM G6 SENSOR) MISC 1 each by Other route daily.     Marland Kitchen glucose blood test strip 1 each by Other route daily. ASCENSIA CONTOUR NEXT ONE: Use to check blood sugar    . injection device for insulin (INPEN 100-PINK-NOVO) DEVI As directed E10.65 1 each 0  . insulin aspart (NOVOLOG) cartridge Inject 8 Units into the skin daily with breakfast AND 12 Units daily with lunch AND 9 Units daily with supper. Max daily dose of 60 units. 45 mL 3  . insulin degludec (TRESIBA FLEXTOUCH) 100 UNIT/ML FlexTouch Pen Inject 0.17 mLs (17 Units total) into the skin  daily. 15 mL 3  . Insulin Pen Needle (B-D UF III MINI PEN NEEDLES) 31G X 5 MM MISC Four times a day 400 each 3  . Insulin Syringe-Needle U-100 31G X 15/64" 0.5 ML MISC 1 Device by Does not apply route daily. 100 each 6  . MICROLET LANCETS MISC Twice a day (Patient taking differently: 1 each by Other route 2 (two) times daily. ) 100 each 11  . Multiple Vitamin (MULTIVITAMIN) capsule Take 4 capsules by mouth daily. Bariatric fusion     . mycophenolate (CELLCEPT) 500 MG tablet Take 2 tablets (1,000 mg total) by mouth 2 (two) times daily. 360 tablet 3  . oxybutynin (DITROPAN) 5 MG tablet TAKE ONE TABLET BY MOUTH TWICE A DAY 180 tablet 3  . sertraline (ZOLOFT) 50 MG tablet Take 1 tablet (50 mg total) by mouth daily. 120 tablet 1  . tiZANidine (ZANAFLEX) 2 MG tablet TAKE ONE TO TWO TABLETS BY MOUTH AT BEDTIME AS NEEDED FOR MUSCLE SPASMS 60 tablet 4  . [DISCONTINUED] fluticasone (FLOVENT HFA) 220 MCG/ACT inhaler Inhale 1 puff into the lungs 2 (two) times daily. 3 Inhaler 4   No current facility-administered medications on file prior to visit.     No Known Allergies  Objective: Joyce Brown is a pleasant 69 y.o.  Caucasian female WD, WN in NAD.  AAO x 3.  There were no vitals filed for this visit.  Neurovascular status intact and unchanged b/l.  Vascular Examination: Capillary refill time immediate x 10 digits. Dorsalis pedis present b/l. Posterior tibial pulses present b/l. Digital hair absent b/l. Skin temperature gradient WNL b/l. No edema b/l LE. No pain with calf compression b/l LE. Protective sensation intact with 10 gram monofilament bilaterally. Epicritic sensation present bilaterally. Vibratory sensation intact bilaterally.  Dermatological Examination: Pedal skin with normal turgor, texture and tone bilaterally. No open wounds bilaterally. No interdigital macerations bilaterally. Toenails 1-5 b/l elongated, dystrophic, thickened, crumbly with subungual debris and tenderness to  dorsal palpation.  Musculoskeletal: Normal muscle strength 5/5 to all lower extremity muscle groups bilaterally. No pain crepitus or joint limitation noted with ROM b/l. Hallux valgus with bunion deformity noted b/l. Hammertoes noted to the 2-5 bilaterally. Utilizes wheelchair for mobility assistance.   Assessment: 1. Pain due to onychomycosis of toenails of both feet   2. Type I diabetes mellitus with complication, uncontrolled ([place])     Plan: -Examined patient. -No new findings. No new orders. -Continue diabetic foot care principles.   -Toenails 1-5 b/l were debrided in length and girth with sterile nail nippers and dremel without iatrogenic bleeding.  -Patient to continue soft, supportive shoe gear daily. -Patient to report any pedal injuries to medical professional immediately. -Patient/POA to call should there be question/concern in the interim.  Return in about 3 months (around 04/30/2020) for diabetic foot care.  Marzetta Board, DPM

## 2020-02-04 ENCOUNTER — Encounter: Payer: Self-pay | Admitting: Internal Medicine

## 2020-02-04 NOTE — Telephone Encounter (Signed)
Placed in [facility] inbox

## 2020-02-17 ENCOUNTER — Encounter: Payer: Self-pay | Admitting: Internal Medicine

## 2020-02-18 ENCOUNTER — Encounter: Payer: Self-pay | Admitting: Family Medicine

## 2020-02-18 ENCOUNTER — Ambulatory Visit (INDEPENDENT_AMBULATORY_CARE_PROVIDER_SITE_OTHER): Payer: Medicare Other | Admitting: Internal Medicine

## 2020-02-18 ENCOUNTER — Encounter: Payer: Self-pay | Admitting: Internal Medicine

## 2020-02-18 ENCOUNTER — Other Ambulatory Visit: Payer: Self-pay

## 2020-02-18 VITALS — BP 130/80 | HR 80 | Ht 70.0 in | Wt 178.2 lb

## 2020-02-18 DIAGNOSIS — E1065 Type 1 diabetes mellitus with hyperglycemia: Secondary | ICD-10-CM

## 2020-02-18 LAB — POCT GLYCOSYLATED HEMOGLOBIN (HGB A1C): Hemoglobin A1C: 6.9 % — AB (ref 4.0–5.6)

## 2020-02-18 NOTE — Progress Notes (Signed)
Name: Joyce Brown  Age/ Sex: 69 y.o., female   MRN/ DOB: [id number], [birth year]     PCP: Lind Covert, MD   Reason for Endocrinology Evaluation: Type 1 Diabetes Mellitus  Initial Endocrine Consultative Visit: 01/17/2018    PATIENT IDENTIFIER: Joyce Brown is a 69 y.o. female with a past medical history of T1DM, cerebellar atrophy, asthma, OSA on CPAP and Hx of gastric bypass in 2014  . The patient has followed with Endocrinology clinic since 01/17/18 for consultative assistance with management of her diabetes.  Patient moved from [place] in September, 2019 to be close to her sister. She has been diagnosed with stiff person Syndrome but Dr. Jannifer Franklin thought this may be more of cerebellar atrophy. She was on  IVIG until 11/07/2018. 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 She has only had one DKA episode which was in 11/2017. Marland Kitchen Her hemoglobin A1c has ranged from 8.4% in 01/2017, peaking at 12.1 % in 01/2016  She is followed by Dr. 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 She has been off IVIG since 02/2018. He has been tapering her diazepam down and is currently on cellcept.    Started using the inpen 07/2018  SUBJECTIVE:   During the last visit (10/19/2019): A1c 6.9 % . We adjusted MDI regimen     Today (02/18/2020): Joyce Brown is here for a 4 months follow up visit on diabetes.  She checks her blood sugars multiple times daily, using  CGM (Dexcom). The patient has had hypoglycemic episodes since the last clinic visit, that she attributes to increased activity during the move. She had a BG of 62 early this morning. The patient is symptomatic with these episodes.   Denies nausea or diarrhea    HOME DIABETES REGIMEN:  Tresiba 17 units QHS Novolog 8 units  With Breakfast, 12 units  with lunch and 9 units with supper  CF (BG - 110/60)    CONTINUOUS GLUCOSE MONITORING RECORD INTERPRETATION    Dates of Recording:11/23-12/08/2019 Sensor description:Dexcom  Results statistics:   CGM use % of time 93  Average and SD 179/76  Time in range  55 %  % Time Above 180 22  % Time above 250 18  % Time Below target 3    Glycemic patterns summary: Hyperglycemia post prandial with tight/low BG 's in between meals and occasional at night  Hypoglycemic episodes occurred postprandial and fasting   Overnight periods: trends down   IN-Pen report 11/22-12/07/2019  Average 179 Missed doses:  2 SD 60 TDD 44.6 units         HISTORY:  Past Medical History:  Past Medical History:  Diagnosis Date  . Asthma   . Cerebellar ataxia ([place])   . Diabetes mellitus without complication ([place])   . Gait abnormality 11/07/2018  . Glaucoma   . Hyperlipidemia   . Hypertension   . Pulmonary embolism ([place])   . Sleep apnea   . Spinal stenosis    Past Surgical History:  Past Surgical History:  Procedure Laterality Date  . ABDOMINAL HYSTERECTOMY    . ADENOIDECTOMY    . ANKLE SURGERY Left   . BILATERAL CARPAL TUNNEL RELEASE    . BREAST BIOPSY Left 2019   benign  . ELBOW SURGERY    . GASTRIC BYPASS  2014  .  HAND NEUROPLASTY    . TONSILLECTOMY      Social History:  reports that she has never smoked. She has never used smokeless tobacco. She reports that she does not drink alcohol and does not use drugs. Family History:  Family History  Problem Relation Age of Onset  . Cervical cancer Mother   . Ovarian cancer Mother   . Dementia Father   . Retinal detachment Father   . Heart attack Father   . Diabetes Father   . Stroke Father   . Hypertension Father   . Glaucoma Paternal Grandfather   . Ovarian cancer Sister      HOME MEDICATIONS: Allergies as of 02/18/2020   No Known Allergies     Medication List       Accurate as of February 18, 2020  9:43 AM. If you have  any questions, ask your nurse or doctor.        acetaminophen 325 MG tablet Commonly known as: TYLENOL Take 650 mg by mouth every 6 (six) hours as needed for moderate pain.   atorvastatin 40 MG tablet Commonly known as: LIPITOR Take 1 tablet (40 mg total) by mouth daily at 6 PM.   B-D UF III MINI PEN NEEDLES 31G X 5 MM Misc Generic drug: Insulin Pen Needle Four times a day   Calcium Citrate 250 MG Tabs Take 2 tablets (500 mg total) by mouth 2 (two) times daily.   Dexcom G6 Sensor Misc 1 each by Other route daily.   glucose blood test strip 1 each by Other route daily. ASCENSIA CONTOUR NEXT ONE: Use to check blood sugar   InPen 100-Pink-Novo Devi Generic drug: injection device for insulin As directed E10.65   insulin aspart cartridge Commonly known as: NOVOLOG Inject 8 Units into the skin daily with breakfast AND 12 Units daily with lunch AND 9 Units daily with supper. Max daily dose of 60 units.   Insulin Syringe-Needle U-100 31G X 15/64" 0.5 ML Misc 1 Device by Does not apply route daily.   Microlet Lancets Misc Twice a day What changed:   how much to take  how to take this  when to take this  additional instructions   multivitamin capsule Take 4 capsules by mouth daily. Bariatric fusion   mycophenolate 500 MG tablet Commonly known as: CELLCEPT Take 2 tablets (1,000 mg total) by mouth 2 (two) times daily.   oxybutynin 5 MG tablet Commonly known as: DITROPAN TAKE ONE TABLET BY MOUTH TWICE A DAY   sertraline 50 MG tablet Commonly known as: ZOLOFT Take 1 tablet (50 mg total) by mouth daily.   tiZANidine 2 MG tablet Commonly known as: ZANAFLEX TAKE ONE TO TWO TABLETS BY MOUTH AT BEDTIME AS NEEDED FOR MUSCLE SPASMS   Tresiba FlexTouch 100 UNIT/ML FlexTouch Pen Generic drug: insulin degludec Inject 0.17 mLs (17 Units total) into the skin daily.   Vitamin D3 75 MCG (3000 UT) Tabs Take 75 mcg by mouth daily.       PHYSICAL EXAM: VS: BP 130/80    Pulse 80   Ht 5\' 10"  (1.778 m)   Wt 178 lb 4 oz (80.9 kg)   SpO2 98%   BMI 25.58 kg/m    EXAM: General: Pt appears well and is in NAD  Lungs: Clear with good BS bilat with no rales, rhonchi, or wheezes  Heart: Auscultation: RRR   Extremities:  BL LE: no pretibial edema   Mental Status: Judgment, insight: intact Mood and affect: no depression,  anxiety, or agitation   DM Foot Exam 04/19/2019 The skin of the feet is intact without sores or ulcerations. The pedal pulses are 2+ on right and 2+ on left. The sensation is intact to a screening 5.07, 10 gram monofilament bilaterally     DATA REVIEWED:  Lab Results  Component Value Date   HGBA1C 6.9 (A) 02/18/2020   HGBA1C 6.9 (A) 10/19/2019   HGBA1C 7.5 (A) 07/19/2019   Lab Results  Component Value Date   MICROALBUR 3.1 (H) 04/19/2019   LDLCALC 102 (H) 01/17/2019   CREATININE 0.87 11/20/2019       ASSESSMENT / PLAN / RECOMMENDATIONS:   1) Type 1 Diabetes Mellitus, Optimally  Controlled - Most recent A1c of 6.9 %. Goal A1c < 7.5 %.     -A1x stable and at goal  - Pt is sensitive to insulin as well as carbohydrate which is not unusual with autoimmune diabetes, but also makes it a bit challenging to reach optimal glucose readings, another contributing factor is her hx of gastric bypass which causes variability in absorption . - Will reduce Tyler Aas due to fasting hypoglycemia     MEDICATIONS:  Decrease Tresiba 16 units daily   Continue Novolog 8 units with breakfast, 12 units with lunch  , and 9 units with  supper   Continue  Sensitivity factor 60    EDUCATION / INSTRUCTIONS:  BG monitoring instructions: Patient is instructed to check her blood sugars 4 times a day with CGM caliberation.  Call [facility] if: BG persistently < 70  . I reviewed the Rule of 15 for the treatment of hypoglycemia in detail with the patient.     2) Diabetic complications:   Eye: Does not have known diabetic  retinopathy.   Neuro/ Feet: Does not have known diabetic peripheral neuropathy.  Renal: Patient does not have known baseline CKD. She is not on an ACEI/ARB at present. Normal microalbuminuria .    F/U in 4 months    Signed electronically by: Mack Guise, MD  [facility]  [facility] [address] [place], [place] [address] Phone: [phone] FAX: [phone]   CC: Lind Covert, [place] [address] Phone: [phone]  Fax: [phone]  Return to Endocrinology clinic as below: Future Appointments  Date Time Provider [place]  02/18/2020  9:50 AM Lynnae Ludemann, Melanie Crazier, MD [facility] None  05/06/2020  9:45 AM Marzetta Board, DPM [facility]  05/23/2020  9:00 AM Clydell Hakim, RD [facility] NDM  05/26/2020  9:00 AM Kathrynn Ducking, MD [facility] None

## 2020-02-18 NOTE — Patient Instructions (Signed)
-   Decrease Tresiba 16 units daily  - Novolog 8 units with Breakfast , 12 units with Lunch and 9 units supper    HOW TO TREAT LOW BLOOD SUGARS (Blood sugar LESS THAN 70 MG/DL)  Please follow the RULE OF 15 for the treatment of hypoglycemia treatment (when your (blood sugars are less than 70 mg/dL)    STEP 1: Take 15 grams of carbohydrates when your blood sugar is low, which includes:   3-4 GLUCOSE TABS  OR  3-4 OZ OF JUICE OR REGULAR SODA OR  ONE TUBE OF GLUCOSE GEL     STEP 2: RECHECK blood sugar in 15 MINUTES STEP 3: If your blood sugar is still low at the 15 minute recheck --> then, go back to STEP 1 and treat AGAIN with another 15 grams of carbohydrates.

## 2020-03-05 ENCOUNTER — Ambulatory Visit (INDEPENDENT_AMBULATORY_CARE_PROVIDER_SITE_OTHER): Payer: Medicare Other | Admitting: Orthopaedic Surgery

## 2020-03-05 ENCOUNTER — Ambulatory Visit: Payer: Self-pay

## 2020-03-05 ENCOUNTER — Encounter: Payer: Self-pay | Admitting: Orthopaedic Surgery

## 2020-03-05 ENCOUNTER — Other Ambulatory Visit: Payer: Self-pay

## 2020-03-05 DIAGNOSIS — M5441 Lumbago with sciatica, right side: Secondary | ICD-10-CM

## 2020-03-05 DIAGNOSIS — M5442 Lumbago with sciatica, left side: Secondary | ICD-10-CM

## 2020-03-05 DIAGNOSIS — G8929 Other chronic pain: Secondary | ICD-10-CM | POA: Diagnosis not present

## 2020-03-05 NOTE — Progress Notes (Signed)
Office Visit Note   Patient: Joyce Brown           Date of Birth: [birth year]           MRN: [id number] Visit Date: 03/05/2020              Requested by: Carney Living, MD [address] [place],  [place] [address] PCP: Carney Living, MD   Assessment & Plan: Visit Diagnoses:  1. Chronic midline low back pain with bilateral sciatica     Plan: Predominantly experiencing low back pain with some discomfort into the lumbosacral junction.  No obvious radiculopathy.  No obvious injury or trauma.  Has history of cerebral ataxia but has not had any recent falls.  There is a possibility of a compression fracture on her plain films and will order an MRI scan  Follow-Up Instructions: Return After MRI scan lumbar spine.   Orders:  Orders Placed This Encounter  Procedures  . XR Lumbar Spine 2-3 Views  . MR Lumbar Spine w/o contrast   No orders of the defined types were placed in this encounter.     Procedures: No procedures performed   Clinical Data: No additional findings.   Subjective: Chief Complaint  Patient presents with  . Left Hip - Pain  . Lower Back - Pain  Patient presents today for lower back pain. She states that she saw Dr.Ellyssa Zagal in March of last year for the same problem. She states that her lower back and hip area started to hurt again a week ago. The pain does not radiate down her legs. No numbness or tingling in her lower extremities. She does report some groin pain, worse on the left. No previous lower back or hip surgery. She is in a wheelchair due to cerebellar ataxia. She takes Tizanidine and Tylenol for her pain. No difficulty sleeping.   HPI  Review of Systems   Objective: Vital Signs: There were no vitals taken for this visit.  Physical Exam Constitutional:      Appearance: She is well-developed and well-nourished.  HENT:     Mouth/Throat:     Mouth: Oropharynx is clear and moist.  Eyes:     Extraocular Movements:  EOM normal.     Pupils: Pupils are equal, round, and reactive to light.  Pulmonary:     Effort: Pulmonary effort is normal.  Skin:    General: Skin is warm and dry.  Neurological:     Mental Status: She is alert and oriented to person, place, and time.  Psychiatric:        Mood and Affect: Mood and affect normal.        Behavior: Behavior normal.     Ortho Exam examined in wheelchair.  Oriented to person place and time and seems comfortable.  There is percussible tenderness along the entire lumbar spine.  Painless range of motion of both hips no pain at the sacroiliac joints or over either buttock Specialty Comments:  No specialty comments available.  Imaging: XR Lumbar Spine 2-3 Views  Result Date: 03/05/2020 Films of the lumbar spine obtained in 2 projections.  There is a right-sided scoliosis with the apex at about L1 of approximately 20 degrees.  There is reversal of the normal lumbar lordosis with significant degenerative disc disease at every level.  There is a possibility of an L1 compression.    PMFS History: Patient Active Problem List   Diagnosis Date Noted  . Bilateral impacted cerumen 08/21/2019  .  Sensorineural hearing loss (SNHL) of both ears 08/21/2019  . Tinnitus, bilateral 06/27/2019  . FH: ovarian cancer in first degree relative 11/16/2018  . Hypercholesteremia 11/15/2018  . Gait abnormality 11/07/2018  . H/O bariatric surgery 10/27/2018  . Pain of left hip joint 05/18/2018  . Chronic midline low back pain with bilateral sciatica 05/18/2018  . Depression 03/20/2018  . Breast calcifications 12/28/2017  . Fibromyalgia 12/06/2017  . History of gastric bypass 12/06/2017  . Spinal stenosis 12/06/2017  . Hypokalemia 12/06/2017  . Asthma 12/06/2017  . Cerebellar ataxia ([place])   . Type I diabetes mellitus with complication, uncontrolled ([place])    Past Medical History:  Diagnosis Date  . Asthma   . Cerebellar ataxia ([place])   . Diabetes mellitus without  complication ([place])   . Gait abnormality 11/07/2018  . Glaucoma   . Hyperlipidemia   . Hypertension   . Pulmonary embolism ([place])   . Sleep apnea   . Spinal stenosis     Family History  Problem Relation Age of Onset  . Cervical cancer Mother   . Ovarian cancer Mother   . Dementia Father   . Retinal detachment Father   . Heart attack Father   . Diabetes Father   . Stroke Father   . Hypertension Father   . Glaucoma Paternal Grandfather   . Ovarian cancer Sister     Past Surgical History:  Procedure Laterality Date  . ABDOMINAL HYSTERECTOMY    . ADENOIDECTOMY    . ANKLE SURGERY Left   . BILATERAL CARPAL TUNNEL RELEASE    . BREAST BIOPSY Left 2019   benign  . ELBOW SURGERY    . GASTRIC BYPASS  2014  . HAND NEUROPLASTY    . TONSILLECTOMY     Social History   Occupational History  . Not on file  Tobacco Use  . Smoking status: Never Smoker  . Smokeless tobacco: Never Used  Vaping Use  . Vaping Use: Never used  Substance and Sexual Activity  . Alcohol use: Never  . Drug use: Never  . Sexual activity: Not on file

## 2020-03-06 ENCOUNTER — Encounter: Payer: Self-pay | Admitting: Family Medicine

## 2020-03-15 DIAGNOSIS — S92909A Unspecified fracture of unspecified foot, initial encounter for closed fracture: Secondary | ICD-10-CM

## 2020-03-15 HISTORY — DX: Unspecified fracture of unspecified foot, initial encounter for closed fracture: S92.909A

## 2020-03-17 ENCOUNTER — Telehealth: Payer: Self-pay | Admitting: Podiatry

## 2020-03-17 NOTE — Telephone Encounter (Signed)
Pt left message on 12.31 and again today to get scheduled for an appt to pick up shoes.   I returned call and left message to call to schedule an appt.

## 2020-03-18 ENCOUNTER — Encounter: Payer: Self-pay | Admitting: Orthopaedic Surgery

## 2020-03-18 ENCOUNTER — Ambulatory Visit: Payer: Medicare Other | Admitting: Orthotics

## 2020-03-20 ENCOUNTER — Encounter: Payer: Self-pay | Admitting: Internal Medicine

## 2020-03-21 ENCOUNTER — Encounter: Payer: Self-pay | Admitting: Internal Medicine

## 2020-03-24 ENCOUNTER — Encounter: Payer: Self-pay | Admitting: Internal Medicine

## 2020-03-24 ENCOUNTER — Encounter: Payer: Self-pay | Admitting: Family Medicine

## 2020-03-24 MED ORDER — SERTRALINE HCL 50 MG PO TABS: ORAL_TABLET | ORAL | 1 refills | Status: DC

## 2020-03-25 ENCOUNTER — Ambulatory Visit (INDEPENDENT_AMBULATORY_CARE_PROVIDER_SITE_OTHER): Payer: Medicare Other | Admitting: Orthotics

## 2020-03-25 ENCOUNTER — Other Ambulatory Visit: Payer: Self-pay

## 2020-03-25 DIAGNOSIS — M2041 Other hammer toe(s) (acquired), right foot: Secondary | ICD-10-CM | POA: Diagnosis not present

## 2020-03-25 DIAGNOSIS — M79674 Pain in right toe(s): Secondary | ICD-10-CM

## 2020-03-25 DIAGNOSIS — R269 Unspecified abnormalities of gait and mobility: Secondary | ICD-10-CM

## 2020-03-25 DIAGNOSIS — E108 Type 1 diabetes mellitus with unspecified complications: Secondary | ICD-10-CM

## 2020-03-25 DIAGNOSIS — B351 Tinea unguium: Secondary | ICD-10-CM | POA: Diagnosis not present

## 2020-03-25 DIAGNOSIS — M2011 Hallux valgus (acquired), right foot: Secondary | ICD-10-CM

## 2020-03-25 DIAGNOSIS — IMO0002 Reserved for concepts with insufficient information to code with codable children: Secondary | ICD-10-CM

## 2020-03-25 DIAGNOSIS — M2010 Hallux valgus (acquired), unspecified foot: Secondary | ICD-10-CM

## 2020-03-25 DIAGNOSIS — M2040 Other hammer toe(s) (acquired), unspecified foot: Secondary | ICD-10-CM

## 2020-03-25 DIAGNOSIS — M79675 Pain in left toe(s): Secondary | ICD-10-CM

## 2020-03-25 DIAGNOSIS — M2042 Other hammer toe(s) (acquired), left foot: Secondary | ICD-10-CM | POA: Diagnosis not present

## 2020-03-25 DIAGNOSIS — E1065 Type 1 diabetes mellitus with hyperglycemia: Secondary | ICD-10-CM

## 2020-03-25 NOTE — Progress Notes (Signed)

## 2020-04-03 ENCOUNTER — Ambulatory Visit
Admission: RE | Admit: 2020-04-03 | Discharge: 2020-04-03 | Disposition: A | Discharge status: Home / Self Care | Payer: Medicare Other | Source: Ambulatory Visit | Attending: Orthopaedic Surgery | Admitting: Orthopaedic Surgery

## 2020-04-03 ENCOUNTER — Other Ambulatory Visit: Payer: Self-pay

## 2020-04-03 DIAGNOSIS — M545 Low back pain, unspecified: Secondary | ICD-10-CM | POA: Diagnosis not present

## 2020-04-03 DIAGNOSIS — G8929 Other chronic pain: Secondary | ICD-10-CM

## 2020-04-08 ENCOUNTER — Other Ambulatory Visit: Payer: Self-pay

## 2020-04-08 ENCOUNTER — Ambulatory Visit (INDEPENDENT_AMBULATORY_CARE_PROVIDER_SITE_OTHER): Payer: Medicare Other | Admitting: Orthopaedic Surgery

## 2020-04-08 ENCOUNTER — Encounter: Payer: Self-pay | Admitting: Orthopaedic Surgery

## 2020-04-08 DIAGNOSIS — M5441 Lumbago with sciatica, right side: Secondary | ICD-10-CM | POA: Diagnosis not present

## 2020-04-08 DIAGNOSIS — G119 Hereditary ataxia, unspecified: Secondary | ICD-10-CM | POA: Diagnosis not present

## 2020-04-08 DIAGNOSIS — M5442 Lumbago with sciatica, left side: Secondary | ICD-10-CM | POA: Diagnosis not present

## 2020-04-08 DIAGNOSIS — G8929 Other chronic pain: Secondary | ICD-10-CM | POA: Diagnosis not present

## 2020-04-08 NOTE — Progress Notes (Signed)
Office Visit Note   Patient: Joyce Brown           Date of Birth: January 31, 1951           MRN: [id number] Visit Date: 04/08/2020              Requested by: Lind Covert, MD [place],  [place] [address] PCP: Lind Covert, MD   Assessment & Plan: Visit Diagnoses:  1. Chronic midline low back pain with bilateral sciatica   2. Cerebellar ataxia [facility])     Plan: Mrs. Acheampong had an MRI scan of her lumbar spine revealing multilevel spondylosis.  There was a lumbar dextrocurvature and slight reversal of lordosis.  Severe left L1-2 and moderate right L4-5 neural foraminal narrowing.  Mild spinal canal narrowing at L1-3 and L4-5.  No evidence of other bony abnormalities.  Will refer to Dr. Ernestina Patches for consideration of facet blocks or epidural steroid injection.  Not having much leg pain.  Follow-Up Instructions: Return in about 1 month (around 05/09/2020).   Orders:  Orders Placed This Encounter  Procedures  . Ambulatory referral to Physical Medicine Rehab   No orders of the defined types were placed in this encounter.     Procedures: No procedures performed   Clinical Data: No additional findings.   Subjective: Chief Complaint  Patient presents with  . Lower Back - Follow-up    MRI Review of LSP  Mrs. Joyce Brown returns for evaluation of the MRI scan of her lumbar spine.  She is having predominately back pain and little if any leg pain.  No change in her symptoms.  Her past history is significant that she has ataxia and a problem with her balance.  She is does use a wheelchair and is being followed by Dr. Jannifer Franklin in the department of neurology.  She also has a history of fibromyalgia which exacerbates her pain.  She moved to [place] in 2019.  Prior to that she lived in [place] she had a problem with her back even while living in [place] and underwent radiofrequency denervation of one level of her back in 2015.  She also  had a history of a bone density in 2018 that was normal.  She is taking Citracal and vitamin D.  Had a gastric bypass in [place] and is on a vitamin and mineral supplement 4 times a day.  She is involved in physical therapy on her own and pool therapy for core strengthening.  HPI  Review of Systems   Objective: Vital Signs: There were no vitals taken for this visit.  Physical Exam Constitutional:      Appearance: She is well-developed and well-nourished.  HENT:     Mouth/Throat:     Mouth: Oropharynx is clear and moist.  Eyes:     Extraocular Movements: EOM normal.     Pupils: Pupils are equal, round, and reactive to light.  Pulmonary:     Effort: Pulmonary effort is normal.  Skin:    General: Skin is warm and dry.  Neurological:     Mental Status: She is alert and oriented to person, place, and time.  Psychiatric:        Mood and Affect: Mood and affect normal.        Behavior: Behavior normal.     Ortho Exam evaluated in a wheelchair.  Awake alert and oriented x3.  Comfortable sitting.  Straight leg raise was negative.  No percussible tenderness of the lumbar  spine.  Specialty Comments:  No specialty comments available.  Imaging: No results found.   PMFS History: Patient Active Problem List   Diagnosis Date Noted  . Bilateral impacted cerumen 08/21/2019  . Sensorineural hearing loss (SNHL) of both ears 08/21/2019  . Tinnitus, bilateral 06/27/2019  . FH: ovarian cancer in first degree relative 11/16/2018  . Hypercholesteremia 11/15/2018  . Gait abnormality 11/07/2018  . H/O bariatric surgery 10/27/2018  . Pain of left hip joint 05/18/2018  . Chronic midline low back pain with bilateral sciatica 05/18/2018  . Depression 03/20/2018  . Breast calcifications 12/28/2017  . Fibromyalgia 12/06/2017  . History of gastric bypass 12/06/2017  . Spinal stenosis 12/06/2017  . Hypokalemia 12/06/2017  . Asthma 12/06/2017  . Cerebellar ataxia ([place])   . Type I  diabetes mellitus with complication, uncontrolled ([place])    Past Medical History:  Diagnosis Date  . Asthma   . Cerebellar ataxia ([place])   . Diabetes mellitus without complication ([place])   . Gait abnormality 11/07/2018  . Glaucoma   . Hyperlipidemia   . Hypertension   . Pulmonary embolism ([place])   . Sleep apnea   . Spinal stenosis     Family History  Problem Relation Age of Onset  . Cervical cancer Mother   . Ovarian cancer Mother   . Dementia Father   . Retinal detachment Father   . Heart attack Father   . Diabetes Father   . Stroke Father   . Hypertension Father   . Glaucoma Paternal Grandfather   . Ovarian cancer Sister     Past Surgical History:  Procedure Laterality Date  . ABDOMINAL HYSTERECTOMY    . ADENOIDECTOMY    . ANKLE SURGERY Left   . BILATERAL CARPAL TUNNEL RELEASE    . BREAST BIOPSY Left 2019   benign  . ELBOW SURGERY    . GASTRIC BYPASS  2014  . HAND NEUROPLASTY    . TONSILLECTOMY     Social History   Occupational History  . Not on file  Tobacco Use  . Smoking status: Never Smoker  . Smokeless tobacco: Never Used  Vaping Use  . Vaping Use: Never used  Substance and Sexual Activity  . Alcohol use: Never  . Drug use: Never  . Sexual activity: Not on file     Garald Balding, MD   Note - This record has been created using [employer].  Chart creation errors have been sought, but may not always  have been located. Such creation errors do not reflect on  the standard of medical care.

## 2020-04-09 ENCOUNTER — Encounter: Payer: Self-pay | Admitting: Internal Medicine

## 2020-04-09 DIAGNOSIS — E1065 Type 1 diabetes mellitus with hyperglycemia: Secondary | ICD-10-CM

## 2020-04-09 MED ORDER — GLUCOSE BLOOD VI STRP: ORAL_STRIP | 2 refills | Status: DC

## 2020-04-19 ENCOUNTER — Encounter: Payer: Self-pay | Admitting: Internal Medicine

## 2020-04-22 ENCOUNTER — Ambulatory Visit (INDEPENDENT_AMBULATORY_CARE_PROVIDER_SITE_OTHER): Payer: Medicare Other | Admitting: Physical Medicine and Rehabilitation

## 2020-04-22 ENCOUNTER — Other Ambulatory Visit: Payer: Self-pay

## 2020-04-22 ENCOUNTER — Encounter: Payer: Self-pay | Admitting: Physical Medicine and Rehabilitation

## 2020-04-22 VITALS — BP 130/73 | HR 64

## 2020-04-22 DIAGNOSIS — M545 Low back pain, unspecified: Secondary | ICD-10-CM | POA: Diagnosis not present

## 2020-04-22 DIAGNOSIS — G8929 Other chronic pain: Secondary | ICD-10-CM | POA: Diagnosis not present

## 2020-04-22 DIAGNOSIS — M47816 Spondylosis without myelopathy or radiculopathy, lumbar region: Secondary | ICD-10-CM

## 2020-04-22 DIAGNOSIS — M4125 Other idiopathic scoliosis, thoracolumbar region: Secondary | ICD-10-CM | POA: Diagnosis not present

## 2020-04-22 DIAGNOSIS — G119 Hereditary ataxia, unspecified: Secondary | ICD-10-CM | POA: Diagnosis not present

## 2020-04-22 NOTE — Progress Notes (Signed)
Low back pain. Equal on both sides. Bending forward or twisting causes increase in pain. Numeric Pain Rating Scale and Functional Assessment Average Pain 6 Pain Right Now 7 My pain is constant, dull, stabbing and aching Pain is worse with: bending Pain improves with: rest   In the last MONTH (on 0-10 scale) has pain interfered with the following?  1. General activity like being  able to carry out your everyday physical activities such as walking, climbing stairs, carrying groceries, or moving a chair?  Rating(5)  2. Relation with others like being able to carry out your usual social activities and roles such as  activities at home, at work and in your community. Rating(5)  3. Enjoyment of life such that you have  been bothered by emotional problems such as feeling anxious, depressed or irritable?  Rating(1)

## 2020-04-22 NOTE — Progress Notes (Signed)
Joyce Brown - 70 y.o. female MRN [id number]  Date of birth: 1950/06/03  Office Visit Note: Visit Date: 04/22/2020 PCP: Lind Covert, MD Referred by: Lind Covert, *  Subjective: Chief Complaint  Patient presents with  . Lower Back - Pain   HPI: Joyce Brown is a 70 y.o. female who comes in today For evaluation management of chronic worsening severe axial low back pain at the request of Dr. Joni Fears.  His notes were fully reviewed and can be reviewed in the chart itself.  She reports 7 out of 10 constant dull stabbing and aching pain across the lower back bilaterally equal on both sides.  It is worse with going from sit to stand and bending forward and then backwards and twisting.  Consistent with facet mediated low back pain.  She has no radicular complaints.  She has no history of lumbar surgery.  She reports a very long history of back pain.  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  She was previously obtaining care of her spine at [facility] and was followed by Theora Master, NP in there neurosurgery department as well as Dr. Rhae Hammock in the interventional pain management department.  The last time she saw nurse Tyna Jaksch was in 2018.  At that office visit follow-up she had reported that radiofrequency ablation procedure performed by Dr. Truman Hayward in October 2015 have been very successful and she really had had no symptoms since that time.  It did appear that at that point they were looking at repeating the procedure but I do not think this was ever done.  She did have a prior radiofrequency ablation procedure performed in January 2015.  Over the years and this is well-documented the patient has had multiple visits and progression through physical therapy both for her back pain and her ataxia.  She has had medical management as well.  Updated MRI of the lumbar spine was performed in 2020 and this is  reviewed below.  This shows multilevel spondylosis from L3-4 down to L5-S1.  There is some level of foraminal narrowing but no level of general central canal stenosis.  She does have retrolisthesis of L4 on L5.  All grade 1 listhesis.  She has some rotatory scoliosis.      Review of Systems  Musculoskeletal: Positive for back pain.  Neurological: Positive for tingling.       Balance difficulty, ataxia  All other systems reviewed and are negative.  Otherwise per HPI.  Assessment & Plan: Visit Diagnoses:    ICD-10-CM   1. Spondylosis without myelopathy or radiculopathy, lumbar region  M47.816   2. Chronic bilateral low back pain without sciatica  M54.50    G89.29   3. Other idiopathic scoliosis, thoracolumbar region  M41.25   4. Cerebellar ataxia (HCC)  G11.9      Plan: Findings:  Chronic worsening severe axial low back pain worse with sit to stand standing and going into extension and rotation.  Exam consistent with facet mediated pain with extension and facet loading.  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  Exam today shows significant ataxia with standing.  She has good strength however no red flag symptoms.  MRI consistent with some scoliosis and mostly facet arthropathy.  She has a well-documented history of prior radiofrequency ablation with good relief and all these are  documented through the notes at [facility].  Documents are in the chart under care everywhere.  I think the best approach for this patient who has had this before is bilateral L3-4 L4-5 and L5-S1 medial branch denervation radiofrequency ablation.  This would represent a repeat ablation and she has had 2 prior ablations in the past with good relief.  She has tried and failed all manner of conservative care and is not a surgical candidate.    Meds & Orders: No orders of the defined types were placed in this encounter.  No orders of the defined types were  placed in this encounter.   Follow-up: Return for Bilateral lower lumbar radiofrequency ablation.   Procedures: No procedures performed      Clinical History: MRI LUMBAR SPINE WITHOUT CONTRAST  COMPARISON: 03/05/2020 and prior.  FINDINGS: Segmentation: Standard.  Alignment: Lumbar dextrocurvature. Slight reversal of lordosis. Minimal grade 1 L4-5 retrolisthesis. Minimal grade 1 L2-3, L3-4 anterolisthesis.  Vertebrae: Vertebral body heights are preserved. Multilevel Modic type 1/2 endplate degenerative changes. No focal osseous lesion.  Disc levels: Multilevel desiccation and Schmorl's node formation.  L1-2: Disc bulge with superimposed left foraminal protrusion. Bilateral facet hypertrophy. Patent right neural foramen. Mild spinal canal and severe left neural foraminal narrowing.  L2-3: Disc bulge with superimposed central and left foraminal protrusions. Bilateral facet hypertrophy. Patent right neural foramen. Mild spinal canal and left neural foraminal narrowing.  L3-4: Disc bulge with shallow central and bilateral foraminal protrusions. Small left extraforaminal protrusion. Bilateral facet hypertrophy. Patent spinal canal. Mild bilateral neural foraminal narrowing.  L4-5: Disc bulge with superimposed right foraminal and shallow right paracentral protrusions abutting the descending right L5 nerve root. Bilateral facet hypertrophy. Mild spinal canal, moderate right and mild left neural foraminal narrowing.  L5-S1: Disc bulge with superimposed right foraminal protrusion. Bilateral facet hypertrophy. Patent spinal canal. Mild bilateral neural foraminal narrowing.  Paraspinal and other soft tissues: Small right renal cysts.  IMPRESSION: Multilevel spondylosis. Lumbar dextrocurvature and slight reversal of lordosis.  Severe left L1-2 and moderate right L4-5 neural foraminal narrowing.  Mild spinal canal narrowing at the L1-3, L4-5 levels.  Mild  left L2-3, bilateral L3-4, left L4-5 and bilateral L5-S1 neural foraminal narrowing.   By: Primitivo Gauze M.D. On: 04/03/2020 09:19 --- LUMBAR/SACRAL MEDIAL BRANCH RADIOFREQUENCY  Joyce Brown [birth year]  Pre-op Diagnosis: Lumbar Spondylosis Nurse: Eulis Canner appiani Doctor: Theodis Sato  Anesthesia: local  Patient has been referred to the pain clinic for radiofrequency treatment of chronic axial back pain. Patient has had long-standing back pain thought to be facet joint generated and which has been refractory to other therapies. Local anesthetic medial branch blocks or intra-articular facet joint injections resulted in Patient reporting a greater than 50% reduction of the usual axial component of pain for at least the duration of the local anesthetic effect. The printed consent form was signed and witnessed.  Patient was placed in the prone position on the fluoroscopy table and automated blood pressure cuff and pulse oximeter applied. The skin entry points for approaching the anatomic target points of the segmental medial branches of bilateral, L3, L4 and L5 MB were identified with a 22.5 degree from perpendicular lateral oblique fluoroscopic view and marked. Following thorough ChloraPrep preparation of the skin and draping and 1% lidocaine infiltration of the skin entry points and subcutaneous tissues, three radiofrequency cannulae, 20-gauge 10 curved were placed under fluoroscopic guidance along or across the anatomic course of each respective segmental medial branch. Each placement was  stimulated at '2Hz'  and up to 4v without any evidence of distal muscle stimulation. At each placement a pulse mode ('2Hz' , 77mec) radiofrequency treatment was done at 80 degree C for 90sec.  Finally 13 mg of Depo-Medrol was added at each site.  Patient's vital signs were stable throughout the procedure and were as recorded in nursing records. Follow up plans and appointments were  discussed with patient. Post procedure instruction was given as documented in nursing records and having met discharge criteria, Pt was discharged to the post operative care.  AJaneth Rase MD   She reports that she has never smoked. She has never used smokeless tobacco.  Recent Labs    07/19/19 0842 10/19/19 0810 02/18/20 0931  HGBA1C 7.5* 6.9* 6.9*    Objective:  VS:  HT:    WT:   BMI:     BP:130/73  HR:64bpm  TEMP: ( )  RESP:  Physical Exam Vitals and nursing note reviewed.  Constitutional:      General: She is not in acute distress.    Appearance: Normal appearance. She is normal weight. She is not ill-appearing.  HENT:     Head: Normocephalic and atraumatic.     Right Ear: External ear normal.     Left Ear: External ear normal.  Eyes:     Extraocular Movements: Extraocular movements intact.  Cardiovascular:     Rate and Rhythm: Normal rate.     Pulses: Normal pulses.  Pulmonary:     Effort: Pulmonary effort is normal. No respiratory distress.  Abdominal:     General: There is no distension.     Palpations: Abdomen is soft.  Musculoskeletal:        General: Tenderness present.     Cervical back: Neck supple.     Right lower leg: No edema.     Left lower leg: No edema.     Comments: Patient has good strength in the lower extremities with hip flexion knee flexion extension dorsiflexion and plantar flexion.  She has no clonus.  Upon arising from a seated position she has very significant ataxia and balance difficulty.  She stands with a forward flexed lumbar spine and she does have concordant low back pain with facet joint loading and extension rotation.  She has no focal trigger points.  No pain over the trochanters.  No pain with hip rotation.  Skin:    Findings: No erythema, lesion or rash.  Neurological:     General: No focal deficit present.     Mental Status: She is alert and oriented to person, place, and time.     Cranial Nerves: No cranial nerve deficit.      Sensory: Sensory deficit present.     Motor: No weakness or abnormal muscle tone.     Coordination: Coordination abnormal.     Gait: Gait abnormal.  Psychiatric:        Mood and Affect: Mood normal.        Behavior: Behavior normal.     Ortho Exam  Imaging: No results found.  Past Medical/Family/Surgical/Social History: Medications & Allergies reviewed per EMR, new medications updated. Patient Active Problem List   Diagnosis Date Noted  . Spondylosis without myelopathy or radiculopathy, lumbar region 04/27/2020  . Bilateral impacted cerumen 08/21/2019  . Sensorineural hearing loss (SNHL) of both ears 08/21/2019  . Tinnitus, bilateral 06/27/2019  . FH: ovarian cancer in first degree relative 11/16/2018  . Hypercholesteremia 11/15/2018  . Gait abnormality 11/07/2018  . H/O bariatric  surgery 10/27/2018  . Pain of left hip joint 05/18/2018  . Chronic midline low back pain with bilateral sciatica 05/18/2018  . Depression 03/20/2018  . Breast calcifications 12/28/2017  . Fibromyalgia 12/06/2017  . History of gastric bypass 12/06/2017  . Spinal stenosis 12/06/2017  . Hypokalemia 12/06/2017  . Asthma 12/06/2017  . Cerebellar ataxia ([place])   . Type I diabetes mellitus with complication, uncontrolled ([place])    Past Medical History:  Diagnosis Date  . Asthma   . Cerebellar ataxia ([place])   . Diabetes mellitus without complication ([place])   . Gait abnormality 11/07/2018  . Glaucoma   . Hyperlipidemia   . Hypertension   . Pulmonary embolism ([place])   . Sleep apnea   . Spinal stenosis    Family History  Problem Relation Age of Onset  . Cervical cancer Mother   . Ovarian cancer Mother   . Dementia Father   . Retinal detachment Father   . Heart attack Father   . Diabetes Father   . Stroke Father   . Hypertension Father   . Glaucoma Paternal Grandfather   . Ovarian cancer Sister    Past Surgical History:  Procedure Laterality Date  . ABDOMINAL HYSTERECTOMY    .  ADENOIDECTOMY    . ANKLE SURGERY Left   . BILATERAL CARPAL TUNNEL RELEASE    . BREAST BIOPSY Left 2019   benign  . ELBOW SURGERY    . GASTRIC BYPASS  2014  . HAND NEUROPLASTY    . TONSILLECTOMY     Social History   Occupational History  . Not on file  Tobacco Use  . Smoking status: Never Smoker  . Smokeless tobacco: Never Used  Vaping Use  . Vaping Use: Never used  Substance and Sexual Activity  . Alcohol use: Never  . Drug use: Never  . Sexual activity: Not on file

## 2020-04-27 ENCOUNTER — Encounter: Payer: Self-pay | Admitting: Physical Medicine and Rehabilitation

## 2020-04-27 DIAGNOSIS — M47816 Spondylosis without myelopathy or radiculopathy, lumbar region: Secondary | ICD-10-CM | POA: Insufficient documentation

## 2020-05-01 ENCOUNTER — Other Ambulatory Visit: Payer: Self-pay | Admitting: Internal Medicine

## 2020-05-02 ENCOUNTER — Telehealth: Payer: Self-pay | Admitting: Physical Medicine and Rehabilitation

## 2020-05-02 NOTE — Telephone Encounter (Signed)
Pt would like to know if she should schedule another radio frequency appt after the one on march 9 please CPB [phone]

## 2020-05-05 NOTE — Telephone Encounter (Signed)
Called pt and informed her about her appt.

## 2020-05-06 ENCOUNTER — Ambulatory Visit (INDEPENDENT_AMBULATORY_CARE_PROVIDER_SITE_OTHER): Payer: Medicare Other | Admitting: Podiatry

## 2020-05-06 ENCOUNTER — Other Ambulatory Visit: Payer: Self-pay

## 2020-05-06 ENCOUNTER — Encounter: Payer: Self-pay | Admitting: Podiatry

## 2020-05-06 DIAGNOSIS — B351 Tinea unguium: Secondary | ICD-10-CM

## 2020-05-06 DIAGNOSIS — M2010 Hallux valgus (acquired), unspecified foot: Secondary | ICD-10-CM

## 2020-05-06 DIAGNOSIS — E108 Type 1 diabetes mellitus with unspecified complications: Secondary | ICD-10-CM

## 2020-05-06 DIAGNOSIS — M79674 Pain in right toe(s): Secondary | ICD-10-CM | POA: Diagnosis not present

## 2020-05-06 DIAGNOSIS — M79675 Pain in left toe(s): Secondary | ICD-10-CM | POA: Diagnosis not present

## 2020-05-06 DIAGNOSIS — M2040 Other hammer toe(s) (acquired), unspecified foot: Secondary | ICD-10-CM

## 2020-05-06 DIAGNOSIS — E1065 Type 1 diabetes mellitus with hyperglycemia: Secondary | ICD-10-CM

## 2020-05-06 DIAGNOSIS — IMO0002 Reserved for concepts with insufficient information to code with codable children: Secondary | ICD-10-CM

## 2020-05-06 NOTE — Progress Notes (Signed)
Subjective: Joyce Brown presents today preventative diabetic foot care and painful mycotic nails b/l that are difficult to trim. Pain interferes with ambulation. Aggravating factors include wearing enclosed shoe gear. Pain is relieved with periodic professional debridement.  Joyce Covert, MD is patient's PCP. Last visit was: 11/20/2019.  She states her blood glucose was 160 mg/dl this morning. Blood glucose was 121 mg/dl in office this morning.  She states she is still exercising at the  [facility].   No Known Allergies  Objective: Joyce Brown is a pleasant 70 y.o.  Caucasian female WD, WN in NAD.  AAO x 3.  There were no vitals filed for this visit.  Neurovascular status intact and unchanged b/l.  Vascular Examination: Capillary refill time immediate x 10 digits. Dorsalis pedis present b/l. Posterior tibial pulses present b/l. Digital hair absent b/l. Skin temperature gradient WNL b/l. No edema b/l LE. No pain with calf compression b/l LE. Protective sensation intact with 10 gram monofilament bilaterally. Epicritic sensation present bilaterally. Vibratory sensation intact bilaterally.  Dermatological Examination: Pedal skin with normal turgor, texture and tone bilaterally. No open wounds bilaterally. No interdigital macerations bilaterally. Toenails 1-5 b/l elongated, dystrophic, thickened, crumbly with subungual debris and tenderness to dorsal palpation.  Musculoskeletal: Normal muscle strength 5/5 to all lower extremity muscle groups bilaterally. No pain crepitus or joint limitation noted with ROM b/l. Hallux valgus with bunion deformity noted b/l. Hammertoes noted to the 2-5 bilaterally. Utilizes wheelchair for mobility assistance.   Assessment: 1. Pain due to onychomycosis of toenails of both feet   2. Hammer toe, unspecified laterality   3. Acquired hallux valgus, unspecified laterality   4. Type I diabetes mellitus with complication, uncontrolled  (Joyce Brown)     Plan: -Examined patient. -No new findings. No new orders. -Continue diabetic foot care principles.   -Toenails 1-5 b/l were debrided in length and girth with sterile nail nippers and dremel without iatrogenic bleeding.  -Patient to continue soft, supportive shoe gear daily. -Patient to report any pedal injuries to medical professional immediately. -Patient/POA to call should there be question/concern in the interim.  Return in about 3 months (around 08/03/2020).  Marzetta Board, DPM

## 2020-05-13 ENCOUNTER — Ambulatory Visit: Payer: Medicare Other | Admitting: Orthopaedic Surgery

## 2020-05-15 ENCOUNTER — Other Ambulatory Visit: Payer: Self-pay

## 2020-05-15 ENCOUNTER — Encounter: Payer: Self-pay | Admitting: Orthopaedic Surgery

## 2020-05-15 ENCOUNTER — Ambulatory Visit (INDEPENDENT_AMBULATORY_CARE_PROVIDER_SITE_OTHER): Payer: Medicare Other | Admitting: Orthopaedic Surgery

## 2020-05-15 VITALS — Ht 70.0 in | Wt 173.0 lb

## 2020-05-15 DIAGNOSIS — G8929 Other chronic pain: Secondary | ICD-10-CM | POA: Diagnosis not present

## 2020-05-15 DIAGNOSIS — M5442 Lumbago with sciatica, left side: Secondary | ICD-10-CM | POA: Diagnosis not present

## 2020-05-15 DIAGNOSIS — M5441 Lumbago with sciatica, right side: Secondary | ICD-10-CM

## 2020-05-15 NOTE — Progress Notes (Signed)
Office Visit Note   Patient: Joyce Brown           Date of Birth: Jun 07, 1950           MRN: [id number] Visit Date: 05/15/2020              Requested by: Lind Covert, MD [place],  [place] [address] PCP: Lind Covert, MD   Assessment & Plan: Visit Diagnoses:  1. Chronic midline low back pain with bilateral sciatica     Plan: Joyce Brown was evaluated by Dr. Ernestina Patches about a month ago and is being scheduled for radiofrequency ablation procedures of her lumbar spine sometime in the next week.  She has had this in the past with excellent relief of her pain.  She does have ataxia and does have difficult time exercising but she does have access to a pool at [facility].  She also was downloaded some exercises to strengthen her core and her back.  We will supplement those with specific back exercises.  No change in his symptoms.  She just want to talk today about what is being done and what is planned and what she can expect over time.  She does have chronic issues with her back and at best were trying to just help alleviate some of the discomfort.  Follow-Up Instructions: Return in about 1 month (around 06/15/2020).   Orders:  No orders of the defined types were placed in this encounter.  No orders of the defined types were placed in this encounter.     Procedures: No procedures performed   Clinical Data: No additional findings.   Subjective: Chief Complaint  Patient presents with  . Lower Back - Follow-up  Patient presents today for a follow up on her lower back. She saw Dr.Newton on 04/22/2020 for a consult. She is going to start radiofrequency next week.   HPI  Review of Systems   Objective: Vital Signs: Ht 5\' 10"  (1.778 m)   Wt 173 lb (78.5 kg)   BMI 24.82 kg/m   Physical Exam Constitutional:      Appearance: She is well-developed and well-nourished.  HENT:     Mouth/Throat:     Mouth: Oropharynx is clear and moist.  Eyes:      Extraocular Movements: EOM normal.     Pupils: Pupils are equal, round, and reactive to light.  Pulmonary:     Effort: Pulmonary effort is normal.  Skin:    General: Skin is warm and dry.  Neurological:     Mental Status: She is alert and oriented to person, place, and time.  Psychiatric:        Mood and Affect: Mood and affect normal.        Behavior: Behavior normal.     Ortho Exam I waited in wheelchair.  She is comfortable in no acute distress.  Straight leg raise is negative bilaterally.  Because of her ataxia I did not have her stand and do a more formal evaluation.  She relates that there were no subjective changes.  The skin was intact  Specialty Comments:  No specialty comments available.  Imaging: No results found.   PMFS History: Patient Active Problem List   Diagnosis Date Noted  . Spondylosis without myelopathy or radiculopathy, lumbar region 04/27/2020  . Bilateral impacted cerumen 08/21/2019  . Sensorineural hearing loss (SNHL) of both ears 08/21/2019  . Tinnitus, bilateral 06/27/2019  . FH: ovarian cancer in first degree relative 11/16/2018  .  Hypercholesteremia 11/15/2018  . Gait abnormality 11/07/2018  . H/O bariatric surgery 10/27/2018  . Pain of left hip joint 05/18/2018  . Chronic midline low back pain with bilateral sciatica 05/18/2018  . Depression 03/20/2018  . Breast calcifications 12/28/2017  . Fibromyalgia 12/06/2017  . History of gastric bypass 12/06/2017  . Spinal stenosis 12/06/2017  . Hypokalemia 12/06/2017  . Asthma 12/06/2017  . Cerebellar ataxia ([place])   . Type I diabetes mellitus with complication, uncontrolled ([employer])    Past Medical History:  Diagnosis Date  . Asthma   . Cerebellar ataxia ([place])   . Diabetes mellitus without complication ([place])   . Gait abnormality 11/07/2018  . Glaucoma   . Hyperlipidemia   . Hypertension   . Pulmonary embolism ([place])   . Sleep apnea   . Spinal stenosis     Family History  Problem Relation  Age of Onset  . Cervical cancer Mother   . Ovarian cancer Mother   . Dementia Father   . Retinal detachment Father   . Heart attack Father   . Diabetes Father   . Stroke Father   . Hypertension Father   . Glaucoma Paternal Grandfather   . Ovarian cancer Sister     Past Surgical History:  Procedure Laterality Date  . ABDOMINAL HYSTERECTOMY    . ADENOIDECTOMY    . ANKLE SURGERY Left   . BILATERAL CARPAL TUNNEL RELEASE    . BREAST BIOPSY Left 2019   benign  . ELBOW SURGERY    . GASTRIC BYPASS  2014  . HAND NEUROPLASTY    . TONSILLECTOMY     Social History   Occupational History  . Not on file  Tobacco Use  . Smoking status: Never Smoker  . Smokeless tobacco: Never Used  Vaping Use  . Vaping Use: Never used  Substance and Sexual Activity  . Alcohol use: Never  . Drug use: Never  . Sexual activity: Not on file

## 2020-05-21 ENCOUNTER — Ambulatory Visit: Payer: Self-pay

## 2020-05-21 ENCOUNTER — Ambulatory Visit (INDEPENDENT_AMBULATORY_CARE_PROVIDER_SITE_OTHER): Payer: Medicare Other | Admitting: Physical Medicine and Rehabilitation

## 2020-05-21 ENCOUNTER — Encounter: Payer: Self-pay | Admitting: Physical Medicine and Rehabilitation

## 2020-05-21 ENCOUNTER — Telehealth: Payer: Self-pay | Admitting: Internal Medicine

## 2020-05-21 ENCOUNTER — Other Ambulatory Visit: Payer: Self-pay

## 2020-05-21 VITALS — BP 150/72 | HR 96

## 2020-05-21 DIAGNOSIS — M47816 Spondylosis without myelopathy or radiculopathy, lumbar region: Secondary | ICD-10-CM | POA: Diagnosis not present

## 2020-05-21 HISTORY — PX: OTHER SURGICAL HISTORY: SHX169

## 2020-05-21 MED ORDER — BETAMETHASONE SOD PHOS & ACET 6 (3-3) MG/ML IJ SUSP
12.0000 mg | Freq: Once | INTRAMUSCULAR | Status: AC
  Administered 2020-05-21: 12 mg

## 2020-05-21 NOTE — Telephone Encounter (Signed)
[facility] called, they are going to be faxing over a request for most recent clinical notes today.Marland Kitchen FYI

## 2020-05-21 NOTE — Telephone Encounter (Signed)
Noted. Will look out for the fax.

## 2020-05-21 NOTE — Progress Notes (Signed)
Pt state lower back pain mostly on her right side.. Pt state walking, standing and laying down. [facility] time she hurts after. Pt state she take over the counter pain meds to help ease her pain.   Numeric Pain Rating Scale and Functional Assessment Average Pain 5   In the last MONTH (on 0-10 scale) has pain interfered with the following?  1. General activity like being  able to carry out your everyday physical activities such as walking, climbing stairs, carrying groceries, or moving a chair?  Rating(8)   +Driver, -BT, -Dye Allergies.

## 2020-05-21 NOTE — Patient Instructions (Signed)

## 2020-05-22 NOTE — Progress Notes (Signed)
Joyce Brown - 70 y.o. female MRN [id number]  Date of birth: April 22, 1950  Office Visit Note: Visit Date: 05/21/2020 PCP: Lind Covert, MD Referred by: Lind Covert, *  Subjective: Chief Complaint  Patient presents with  . Lower Back - Pain   HPI:  Joyce Brown is a 70 y.o. female who comes in today for planned repeat radiofrequency ablation of the Right L3-L4, L4-L5 and L5-S1  Lumbar facet joints. This would be ablation of the corresponding medial branches and/or dorsal rami.  Patient has had double diagnostic blocks with more than 70% relief.  Subsequent ablation gave them more than 6 months of over 60% relief.  They have had chronic back pain for quite some time, more than 3 months, which has been an ongoing situation with recalcitrant axial back pain.  They have no radicular pain.  Their axial pain is worse with standing and ambulating and on exam today with facet loading.  They have had physical therapy as well as home exercise program.  The imaging noted in the chart below indicated facet pathology. Accordingly they meet all the criteria and qualification for for radiofrequency ablation and we are going to complete this today hopefully for more longer term relief as part of comprehensive management program.  ROS Otherwise per HPI.  Assessment & Plan: Visit Diagnoses:    ICD-10-CM   1. Spondylosis without myelopathy or radiculopathy, lumbar region  M47.816 XR C-ARM NO REPORT    Radiofrequency,Lumbar    betamethasone acetate-betamethasone sodium phosphate (CELESTONE) injection 12 mg    Plan: No additional findings.   Meds & Orders:  Meds ordered this encounter  Medications  . betamethasone acetate-betamethasone sodium phosphate (CELESTONE) injection 12 mg    Orders Placed This Encounter  Procedures  . Radiofrequency,Lumbar  . XR C-ARM NO REPORT    Follow-up: No follow-ups on file.   Procedures: No procedures performed  Lumbar Facet Joint  Nerve Denervation  Patient: Joyce Brown      Date of Birth: 04/13/1950 MRN: [id number] PCP: Lind Covert, MD      Visit Date: 05/21/2020   Universal Protocol:    Date/Time: 05/23/2210:54 PM  Consent Given By: the patient  Position: PRONE  Additional Comments: Vital signs were monitored before and after the procedure. Patient was prepped and draped in the usual sterile fashion. The correct patient, procedure, and site was verified.   Injection Procedure Details:   Procedure diagnoses:  1. Spondylosis without myelopathy or radiculopathy, lumbar region      Meds Administered:  Meds ordered this encounter  Medications  . betamethasone acetate-betamethasone sodium phosphate (CELESTONE) injection 12 mg     Laterality: Right  Location/Site:  L3-L4, L2 and L3 medial branches, L4-L5, L3 and L4 medial branches and L5-S1, L4 medial branch and L5 dorsal ramus  Needle: 18 ga.,  30m active tip RF Cannula  Needle Placement: Along juncture of superior articular process and transverse pocess  Findings:  -Comments:  Procedure Details: For each desired target nerve, the corresponding transverse process (sacral ala for the L5 dorsal rami) was identified and the fluoroscope was positioned to square off the endplates of the corresponding vertebral body to achieve a true AP midline view.  The beam was then obliqued 15 to 20 degrees and caudally tilted 15 to 20 degrees to line up a trajectory along the target nerves. The skin over the target of the junction of superior articulating process and transverse process (sacral ala for the L5 dorsal  rami) was infiltrated with 61m of 1% Lidocaine without Epinephrine.  The 18 gauge 177mactive tip outer cannula was advanced in trajectory view to the target.  This procedure was repeated for each target nerve.  Then, for all levels, the outer cannula placement was fine-tuned and the position was then confirmed with bi-planar imaging.     Test stimulation was done both at sensory and motor levels to ensure there was no radicular stimulation. The target tissues were then infiltrated with 1 ml of 1% Lidocaine without Epinephrine. Subsequently, a percutaneous neurotomy was carried out for 90 seconds at 80 degrees Celsius.  After the completion of the lesion, 1 ml of injectate was delivered. It was then repeated for each facet joint nerve mentioned above. Appropriate radiographs were obtained to verify the probe placement during the neurotomy.   Additional Comments:  The patient tolerated the procedure well Dressing: 2 x 2 sterile gauze and Band-Aid    Post-procedure details: Patient was observed during the procedure. Post-procedure instructions were reviewed.  Patient left the clinic in stable condition.        Clinical History: MRI LUMBAR SPINE WITHOUT CONTRAST  COMPARISON: 03/05/2020 and prior.  FINDINGS: Segmentation: Standard.  Alignment: Lumbar dextrocurvature. Slight reversal of lordosis. Minimal grade 1 L4-5 retrolisthesis. Minimal grade 1 L2-3, L3-4 anterolisthesis.  Vertebrae: Vertebral body heights are preserved. Multilevel Modic type 1/2 endplate degenerative changes. No focal osseous lesion.  Disc levels: Multilevel desiccation and Schmorl's node formation.  L1-2: Disc bulge with superimposed left foraminal protrusion. Bilateral facet hypertrophy. Patent right neural foramen. Mild spinal canal and severe left neural foraminal narrowing.  L2-3: Disc bulge with superimposed central and left foraminal protrusions. Bilateral facet hypertrophy. Patent right neural foramen. Mild spinal canal and left neural foraminal narrowing.  L3-4: Disc bulge with shallow central and bilateral foraminal protrusions. Small left extraforaminal protrusion. Bilateral facet hypertrophy. Patent spinal canal. Mild bilateral neural foraminal narrowing.  L4-5: Disc bulge with superimposed right foraminal and  shallow right paracentral protrusions abutting the descending right L5 nerve root. Bilateral facet hypertrophy. Mild spinal canal, moderate right and mild left neural foraminal narrowing.  L5-S1: Disc bulge with superimposed right foraminal protrusion. Bilateral facet hypertrophy. Patent spinal canal. Mild bilateral neural foraminal narrowing.  Paraspinal and other soft tissues: Small right renal cysts.  IMPRESSION: Multilevel spondylosis. Lumbar dextrocurvature and slight reversal of lordosis.  Severe left L1-2 and moderate right L4-5 neural foraminal narrowing.  Mild spinal canal narrowing at the L1-3, L4-5 levels.  Mild left L2-3, bilateral L3-4, left L4-5 and bilateral L5-S1 neural foraminal narrowing.   By: ChPrimitivo Gauze.D. On: 04/03/2020 09:19 --- LUMBAR/SACRAL MEDIAL BRANCH RADIOFREQUENCY  LyVY BADLEY0[birth year]Pre-op Diagnosis: Lumbar Spondylosis Nurse: LeEulis Cannerppiani Doctor: ArTheodis SatoAnesthesia: local  Patient has been referred to the pain clinic for radiofrequency treatment of chronic axial back pain. Patient has had long-standing back pain thought to be facet joint generated and which has been refractory to other therapies. Local anesthetic medial branch blocks or intra-articular facet joint injections resulted in Patient reporting a greater than 50% reduction of the usual axial component of pain for at least the duration of the local anesthetic effect. The printed consent form was signed and witnessed.  Patient was placed in the prone position on the fluoroscopy table and automated blood pressure cuff and pulse oximeter applied. The skin entry points for approaching the anatomic target points of the segmental medial branches of bilateral, L3, L4 and L5 MB  were identified with a 22.5 degree from perpendicular lateral oblique fluoroscopic view and marked. Following thorough ChloraPrep preparation of the skin and draping and  1% lidocaine infiltration of the skin entry points and subcutaneous tissues, three radiofrequency cannulae, 20-gauge 10 curved were placed under fluoroscopic guidance along or across the anatomic course of each respective segmental medial branch. Each placement was stimulated at '2Hz'  and up to 4v without any evidence of distal muscle stimulation. At each placement a pulse mode ('2Hz' , 51mec) radiofrequency treatment was done at 80 degree C for 90sec.  Finally 13 mg of Depo-Medrol was added at each site.  Patient's vital signs were stable throughout the procedure and were as recorded in nursing records. Follow up plans and appointments were discussed with patient. Post procedure instruction was given as documented in nursing records and having met discharge criteria, Pt was discharged to the post operative care.  AJaneth Rase MD     Objective:  VS:  HT:    WT:   BMI:     BP:(!) 150/72  HR:96bpm  TEMP: ( )  RESP:  Physical Exam Vitals and nursing note reviewed.  Constitutional:      General: She is not in acute distress.    Appearance: Normal appearance. She is not ill-appearing.  HENT:     Head: Normocephalic and atraumatic.     Right Ear: External ear normal.     Left Ear: External ear normal.  Eyes:     Extraocular Movements: Extraocular movements intact.  Cardiovascular:     Rate and Rhythm: Normal rate.     Pulses: Normal pulses.  Pulmonary:     Effort: Pulmonary effort is normal. No respiratory distress.  Abdominal:     General: There is no distension.     Palpations: Abdomen is soft.  Musculoskeletal:        General: Tenderness present.     Cervical back: Neck supple.     Right lower leg: No edema.     Left lower leg: No edema.     Comments: Patient has good distal strength with no pain over the greater trochanters.  No clonus or focal weakness. Patient somewhat slow to rise from a seated position to full extension.  There is concordant low back pain with facet loading  and lumbar spine extension rotation.  There are no definitive trigger points but the patient is somewhat tender across the lower back and PSIS.  There is no pain with hip rotation.   Skin:    Findings: No erythema, lesion or rash.  Neurological:     General: No focal deficit present.     Mental Status: She is alert and oriented to person, place, and time.     Sensory: No sensory deficit.     Motor: No weakness or abnormal muscle tone.     Coordination: Coordination normal.     Gait: Gait abnormal.  Psychiatric:        Mood and Affect: Mood normal.        Behavior: Behavior normal.      Imaging: XR C-ARM NO REPORT  Result Date: 05/21/2020 Please see Notes tab for imaging impression.

## 2020-05-22 NOTE — Procedures (Signed)
Lumbar Facet Joint Nerve Denervation  Patient: Joyce Brown      Date of Birth: 03-09-1951 MRN: [id number] PCP: Lind Covert, MD      Visit Date: 05/21/2020   Universal Protocol:    Date/Time: 05/23/2210:54 PM  Consent Given By: the patient  Position: PRONE  Additional Comments: Vital signs were monitored before and after the procedure. Patient was prepped and draped in the usual sterile fashion. The correct patient, procedure, and site was verified.   Injection Procedure Details:   Procedure diagnoses:  1. Spondylosis without myelopathy or radiculopathy, lumbar region      Meds Administered:  Meds ordered this encounter  Medications  . betamethasone acetate-betamethasone sodium phosphate (CELESTONE) injection 12 mg     Laterality: Right  Location/Site:  L3-L4, L2 and L3 medial branches, L4-L5, L3 and L4 medial branches and L5-S1, L4 medial branch and L5 dorsal ramus  Needle: 18 ga.,  35mm active tip RF Cannula  Needle Placement: Along juncture of superior articular process and transverse pocess  Findings:  -Comments:  Procedure Details: For each desired target nerve, the corresponding transverse process (sacral ala for the L5 dorsal rami) was identified and the fluoroscope was positioned to square off the endplates of the corresponding vertebral body to achieve a true AP midline view.  The beam was then obliqued 15 to 20 degrees and caudally tilted 15 to 20 degrees to line up a trajectory along the target nerves. The skin over the target of the junction of superior articulating process and transverse process (sacral ala for the L5 dorsal rami) was infiltrated with 13ml of 1% Lidocaine without Epinephrine.  The 18 gauge 34mm active tip outer cannula was advanced in trajectory view to the target.  This procedure was repeated for each target nerve.  Then, for all levels, the outer cannula placement was fine-tuned and the position was then confirmed with  bi-planar imaging.    Test stimulation was done both at sensory and motor levels to ensure there was no radicular stimulation. The target tissues were then infiltrated with 1 ml of 1% Lidocaine without Epinephrine. Subsequently, a percutaneous neurotomy was carried out for 90 seconds at 80 degrees Celsius.  After the completion of the lesion, 1 ml of injectate was delivered. It was then repeated for each facet joint nerve mentioned above. Appropriate radiographs were obtained to verify the probe placement during the neurotomy.   Additional Comments:  The patient tolerated the procedure well Dressing: 2 x 2 sterile gauze and Band-Aid    Post-procedure details: Patient was observed during the procedure. Post-procedure instructions were reviewed.  Patient left the clinic in stable condition.

## 2020-05-23 ENCOUNTER — Encounter: Payer: Self-pay | Admitting: Internal Medicine

## 2020-05-23 ENCOUNTER — Encounter: Payer: Medicare Other | Attending: Internal Medicine | Admitting: Dietician

## 2020-05-23 ENCOUNTER — Other Ambulatory Visit: Payer: Self-pay

## 2020-05-23 ENCOUNTER — Encounter: Payer: Self-pay | Admitting: Dietician

## 2020-05-23 DIAGNOSIS — IMO0002 Reserved for concepts with insufficient information to code with codable children: Secondary | ICD-10-CM

## 2020-05-23 DIAGNOSIS — E108 Type 1 diabetes mellitus with unspecified complications: Secondary | ICD-10-CM | POA: Diagnosis not present

## 2020-05-23 DIAGNOSIS — E1065 Type 1 diabetes mellitus with hyperglycemia: Secondary | ICD-10-CM | POA: Insufficient documentation

## 2020-05-23 NOTE — Patient Instructions (Signed)
Currently your diet is quite consistent in carbohydrates. Be mindful of a meal that is at the higher end to see the effect on your glucose.  Modify to decrease the carbs as needed or eat this on days you go to the pool Continue to stay active. Continue the good work!

## 2020-05-23 NOTE — Progress Notes (Signed)
Diabetes Self-Management Education  Visit Type: Follow-up  Appt. Start Time: 0900 Appt. End Time: 0930  05/23/2020  Joyce Brown, identified by name and date of birth, is a 70 y.o. female with a diagnosis of Diabetes:  .   ASSESSMENT She moved in November.   Stopped eating so much processed foods. Cravings for sweets have significantly decreased.  She states that she had a radio frequency procedure done on her back on 05/21/2020.  She states that they use a small amount of steroids and her blood sugar has increased due to this.  Fasting 276 this am and near 400 most of yesterday afternoon.  She has been going to the [facility] and walks in the pool for 30-45 minutes 2 days per week.  She is working on posture and core strength.  She wheels her chair around her apartment complex most days as well. She takes a snack to the pool as her glucose drops after the workout.  She brought a 3 day meal rotation.  She chooses protein at 90% of her meals and 75% of her snacks.  Carbohydrate amounts are estimated to be consistent with about 35 grams of carbs for breakfast and 50 grams of carbohydrates at each lunch and dinner with the exception of 1 meal which was about 90 grams of carbohydrates.  Breakfast is more on days that she will be going to the pool or appointments.  We discussed the carbohydrate content of foods/meals and instructed her to monitor her blood sugar after meals that contain more carbohydrates.  These meals could also be reserved for the days that she is most active such as pool days.  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  Type 1 Diabetes since 2007, asthma, cerebellar ataxia, HTN, HLD, OSA on C-pap and sleep study Julyl 6. Noted swallow evaluation with recommendation of a Dysphagia 3 diet. Dexcom G-6 since 07/11/18. Inpen  Medication includesTresiba 16 units q HSand Novolog8 units before breakfast,13 units  before lunch and 9 units before dinner and 1 unit before a snack.  She is not aiming to give this 30 minutes prior to the meal which has improved her blood sugar control.  Wt Readings from Last 3 Encounters:  05/15/20 173 lb (78.5 kg)  02/18/20 178 lb 4 oz (80.9 kg)  11/20/19 179 lb 9.6 oz (81.5 kg)    Patient lives alone. She uses a wheel chair. She is doing her own grocery shopping. She uses [employer] [facility] for medical appointments but is able to wheel herself to the grocery store. 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 She moved here from [place] last year to be closer to one of her sister's who lives in [place]. The pool at her apartment is to reopen outdoors. Patient enjoys walking in the pool.  She last saw an RD in [place] about 11 monthsprior to my first visit.    Diabetes Self-Management Education - 05/23/20 1250      Visit Information   Visit Type Follow-up      Psychosocial Assessment   Patient Belief/Attitude about Diabetes Motivated to manage diabetes    Other persons present Patient    Learning Readiness Ready      Pre-Education Assessment   Patient understands the diabetes disease and treatment process. Needs Instruction    Patient understands incorporating nutritional management into lifestyle. Needs Instruction    Patient undertands incorporating physical activity into lifestyle. Needs Instruction  Patient understands using medications safely. Needs Instruction    Patient understands monitoring blood glucose, interpreting and using results Needs Instruction    Patient understands prevention, detection, and treatment of acute complications. Needs Instruction    Patient understands prevention, detection, and treatment of chronic complications. Needs Instruction    Patient understands how to develop strategies to address psychosocial issues. Needs Instruction    Patient understands how to develop  strategies to promote health/change behavior. Needs Instruction      Complications   Last HgB A1C per patient/outside source 6.9 %   02/18/2020 unchanged from 10/19/2019   How often do you check your blood sugar? > 4 times/day   Dexcom   Fasting Blood glucose range (mg/dL) 70-129;130-179    Postprandial Blood glucose range (mg/dL) 180-200;130-179    Number of hypoglycemic episodes per month 2    Can you tell when your blood sugar is low? Yes      Dietary Intake   Breakfast reg oats, pumpkin, SF creamer, boiled egg OR toast, greek yogurt, boiled egg, V-8 OR Avocado toast, fruit, boiled egg OR McVities Biscuits and fruit, boiled egg    Lunch tuna, hummus, whole grain bread, baby carrots OR roasted chicken, 1/2 cup brown rice, raw carrots, apple, 1 oz cheese OR 15 bean soup, grilled cheese sand, apple    Snack (afternoon) occasional kind bar OR PB and apple OR McVites Biscuits and 1 oz cheese OR freeze dried mangoes    Dinner roasted chicken, steamed potatoes, broccoli, apple OR meat loaf, steamed potatoes, raw carrots, 1 mandarin orange, 2 McVities Biscuits OR 15 bean soup, baked cornbread, raw carrots    Beverage(s) water, coffee with Jordan's skinny serves SF creamer      Exercise   Exercise Type Light (walking / raking leaves);Moderate (swimming / aerobic walking)    How many days per week to you exercise? 5    How many minutes per day do you exercise? 45    Total minutes per week of exercise 225      Patient Education   Nutrition management  Carbohydrate counting;Other (comment)   iron sources, evaluation of her menus   Physical activity and exercise  Other (comment)   consistency, continued importance   Monitoring Other (comment)   evaluated dexcom/inpen report     Individualized Goals (developed by patient)   Nutrition General guidelines for healthy choices and portions discussed    Physical Activity Exercise 5-7 days per week;45 minutes per day    Medications take my medication as  prescribed    Monitoring  test my blood glucose as discussed    Reducing Risk increase portions of healthy fats;examine blood glucose patterns      Patient Self-Evaluation of Goals - Patient rates self as meeting previously set goals (% of time)   Nutrition >75%    Physical Activity >75%    Medications >75%    Monitoring >75%    Problem Solving >75%    Reducing Risk >75%    Health Coping >75%      Post-Education Assessment   Patient understands the diabetes disease and treatment process. Demonstrates understanding / competency    Patient understands incorporating nutritional management into lifestyle. Needs Review    Patient undertands incorporating physical activity into lifestyle. Demonstrates understanding / competency    Patient understands using medications safely. Demonstrates understanding / competency    Patient understands monitoring blood glucose, interpreting and using results Demonstrates understanding / competency    Patient understands prevention,  detection, and treatment of acute complications. Demonstrates understanding / competency    Patient understands prevention, detection, and treatment of chronic complications. Demonstrates understanding / competency    Patient understands how to develop strategies to address psychosocial issues. Demonstrates understanding / competency    Patient understands how to develop strategies to promote health/change behavior. Needs Review      Outcomes   Expected Outcomes Demonstrated interest in learning. Expect positive outcomes    Future DMSE 6 months    Program Status Completed      Subsequent Visit   Since your last visit have you experienced any weight changes? Loss    Weight Loss (lbs) 2    Since your last visit, are you checking your blood glucose at least once a day? Yes           Individualized Plan for Diabetes Self-Management Training:   Learning Objective:  Patient will have a greater understanding of diabetes  self-management. Patient education plan is to attend individual and/or group sessions per assessed needs and concerns.   Plan:   Patient Instructions  Currently your diet is quite consistent in carbohydrates. Be mindful of a meal that is at the higher end to see the effect on your glucose.  Modify to decrease the carbs as needed or eat this on days you go to the pool Continue to stay active. Continue the good work!   Expected Outcomes:  Demonstrated interest in learning. Expect positive outcomes  Education material provided: Meal plan card, Sources of Iron per patient request (from [facility])  If problems or questions, patient to contact team via:  Phone  Future DSME appointment: 6 months

## 2020-05-26 ENCOUNTER — Encounter: Payer: Self-pay | Admitting: Neurology

## 2020-05-26 ENCOUNTER — Ambulatory Visit (INDEPENDENT_AMBULATORY_CARE_PROVIDER_SITE_OTHER): Payer: Medicare Other | Admitting: Neurology

## 2020-05-26 VITALS — BP 133/72 | HR 72 | Ht 70.0 in | Wt 173.0 lb

## 2020-05-26 DIAGNOSIS — R269 Unspecified abnormalities of gait and mobility: Secondary | ICD-10-CM | POA: Diagnosis not present

## 2020-05-26 DIAGNOSIS — G119 Hereditary ataxia, unspecified: Secondary | ICD-10-CM

## 2020-05-26 DIAGNOSIS — Z5181 Encounter for therapeutic drug level monitoring: Secondary | ICD-10-CM

## 2020-05-26 MED ORDER — MYCOPHENOLATE MOFETIL 500 MG PO TABS: 1000.0000 mg | ORAL_TABLET | Freq: Two times a day (BID) | ORAL | 3 refills | Status: DC

## 2020-05-26 NOTE — Progress Notes (Signed)
Reason for visit: Cerebellar ataxia  Joyce Brown is an 70 y.o. 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  She is on CellCept for this, she tolerates the medication well and has noted good stability and underlying clinical condition.  She has severe gait instability, she generally does not walk unless someone is holding onto her, she is able to transfer from the chair to the wheelchair or to the bed at home, she does this safely, she has not had any falls.  She has had some low back issues and is followed by Dr. Durward Fortes.  She is getting radiofrequency ablation procedures through Dr. Laurence Spates.  This has been beneficial for her in the past.  The patient may take Tylenol if needed.  She is trying to stay active with exercises while swimming, she [facility].  She has diabetes, her last hemoglobin A1c was 6.9.  She returns to this office for an evaluation.  With the exercise program, she believes that her ability to balance has improved some.  She denies any significant issues with swallowing or choking.  She does report some double vision on occasion.  Past Medical History:  Diagnosis Date  . Asthma   . Cerebellar ataxia ([place])   . Diabetes mellitus without complication ([place])   . Gait abnormality 11/07/2018  . Glaucoma   . Hyperlipidemia   . Hypertension   . Pulmonary embolism ([place])   . Sleep apnea   . Spinal stenosis     Past Surgical History:  Procedure Laterality Date  . ABDOMINAL HYSTERECTOMY    . ADENOIDECTOMY    . ANKLE SURGERY Left   . BILATERAL CARPAL TUNNEL RELEASE    . BREAST BIOPSY Left 2019   benign  . ELBOW SURGERY    . GASTRIC BYPASS  2014  . HAND NEUROPLASTY    . radio denervation  05/21/2020  . TONSILLECTOMY      Family  History  Problem Relation Age of Onset  . Cervical cancer Mother   . Ovarian cancer Mother   . Dementia Father   . Retinal detachment Father   . Heart attack Father   . Diabetes Father   . Stroke Father   . Hypertension Father   . Glaucoma Paternal Grandfather   . Ovarian cancer Sister     Social history:  reports that she has never smoked. She has never used smokeless tobacco. She reports that she does not drink alcohol and does not use drugs.   No Known Allergies  Medications:  Prior to Admission medications   Medication Sig Start Date End Date Taking? Authorizing Provider  acetaminophen (TYLENOL) 325 MG tablet Take 650 mg by mouth every 6 (six) hours as needed for moderate pain.    Yes [provider]  atorvastatin (LIPITOR) 40 MG tablet Take 1 tablet (40 mg total) by mouth daily at 6 PM. 09/19/19  Yes Chambliss, Jeb Levering, MD  Calcium Citrate 250 MG TABS Take 2 tablets (500 mg total) by mouth 2 (two) times daily. 01/17/18  Yes Shamleffer, Melanie Crazier, MD  Cholecalciferol (VITAMIN D3) 75 MCG (3000 UT) TABS Take 75 mcg by mouth daily.    Yes [provider]  Continuous Blood Gluc Sensor (DEXCOM G6 SENSOR) MISC 1 each  by Other route daily.    Yes [provider]  glucose blood test strip ASCENSIA CONTOUR NEXT ONE: Use 1 strip to check blood sugar daily 04/09/20  Yes Shamleffer, Melanie Crazier, MD  injection device for insulin (INPEN 100-PINK-NOVO) DEVI As directed E10.65 05/03/19  Yes Shamleffer, Melanie Crazier, MD  insulin aspart (NOVOLOG PENFILL) cartridge INJECT 8 UNITS INTO THE SKIN DAILY WITH BREAKFAST AND 12 UNITS DAILY WITH LUNCH AND 9 UNITS DAILY WITH SUPPER. MAX DAILY DOSE OF 60 UNITS 05/01/20  Yes Shamleffer, Melanie Crazier, MD  insulin degludec (TRESIBA FLEXTOUCH) 100 UNIT/ML FlexTouch Pen Inject 0.17 mLs (17 Units total) into the skin daily. 10/19/19  Yes Shamleffer, Melanie Crazier, MD  Insulin Pen Needle (B-D UF III MINI PEN NEEDLES) 31G X 5 MM  MISC Four times a day 10/19/19  Yes Shamleffer, Melanie Crazier, MD  Insulin Syringe-Needle U-100 31G X 15/64" 0.5 ML MISC 1 Device by Does not apply route daily. 04/19/19  Yes Shamleffer, Melanie Crazier, MD  MICROLET LANCETS MISC Twice a day Patient taking differently: 1 each by Other route 2 (two) times daily. 02/07/18  Yes Shamleffer, Melanie Crazier, MD  Multiple Vitamin (MULTIVITAMIN) capsule Take 4 capsules by mouth daily. Bariatric fusion    Yes [provider]  mycophenolate (CELLCEPT) 500 MG tablet Take 2 tablets (1,000 mg total) by mouth 2 (two) times daily. 07/09/19  Yes Kathrynn Ducking, MD  oxybutynin (DITROPAN) 5 MG tablet TAKE ONE TABLET BY MOUTH TWICE A DAY 07/11/19  Yes Lind Covert, MD  sertraline (ZOLOFT) 50 MG tablet 1-2 by mouth daily 03/24/20  Yes Chambliss, Jeb Levering, MD  tiZANidine (ZANAFLEX) 2 MG tablet TAKE ONE TO TWO TABLETS BY MOUTH AT BEDTIME AS NEEDED FOR MUSCLE SPASMS 11/01/19  Yes Lind Covert, MD    ROS:  Out of a complete 14 system review of symptoms, the patient complains only of the following symptoms, and all other reviewed systems are negative.  Double vision Walking difficulty Low back pain  Blood pressure 133/72, pulse 72, height 5\' 10"  (1.778 m), weight 173 lb (78.5 kg).  Physical Exam  General: The patient is alert and cooperative at the time of the examination.  Skin: No significant peripheral edema is noted.   Neurologic Exam  Mental status: The patient is alert and oriented x 3 at the time of the examination. The patient has apparent normal recent and remote memory, with an apparently normal attention span and concentration ability.   Cranial nerves: Facial symmetry is present. Speech is ataxic, slightly dysarthric at times, not aphasic. Extraocular movements are full. Visual fields are full.  Motor: The patient has good strength in all 4 extremities.  Sensory examination: Soft touch sensation is symmetric on the  face, arms, and legs.  Coordination: The patient has dysmetria with finger-nose-finger and heel-to-shin bilaterally.  Gait and station: The patient has the ability to stand with some assistance, she can walk with maximal assistance, gait is ataxic, wide-based.  Reflexes: Deep tendon reflexes are symmetric.   MRI lumbar 04/03/20:  IMPRESSION: Multilevel spondylosis. Lumbar dextrocurvature and slight reversal of lordosis.  Severe left L1-2 and moderate right L4-5 neural foraminal narrowing.  Mild spinal canal narrowing at the L1-3, L4-5 levels.  Mild left L2-3, bilateral L3-4, left L4-5 and bilateral L5-S1 neural foraminal narrowing.   Assessment/Plan:  1.  Cerebellar ataxia associated with anti-gad antibodies  2.  Chronic low back pain  3.  Gait disorder  The patient will continue the CellCept, she  will have blood work done today.  She will follow up here in 6 months.  A prescription for her CellCept was sent in.  Jill Alexanders MD 05/26/2020 9:02 AM  [facility] [address] [place], [place] [address]  Phone [phone] Fax [phone]

## 2020-05-27 LAB — COMPREHENSIVE METABOLIC PANEL
ALT: 21 IU/L (ref 0–32)
AST: 20 IU/L (ref 0–40)
Albumin/Globulin Ratio: 2.1 (ref 1.2–2.2)
Albumin: 4.2 g/dL (ref 3.8–4.8)
Alkaline Phosphatase: 62 IU/L (ref 44–121)
BUN/Creatinine Ratio: 23 (ref 12–28)
BUN: 20 mg/dL (ref 8–27)
Bilirubin Total: 0.3 mg/dL (ref 0.0–1.2)
CO2: 22 mmol/L (ref 20–29)
Calcium: 9.9 mg/dL (ref 8.7–10.3)
Chloride: 104 mmol/L (ref 96–106)
Creatinine, Ser: 0.86 mg/dL (ref 0.57–1.00)
Globulin, Total: 2 g/dL (ref 1.5–4.5)
Glucose: 82 mg/dL (ref 65–99)
Potassium: 5.6 mmol/L — ABNORMAL HIGH (ref 3.5–5.2)
Sodium: 141 mmol/L (ref 134–144)
Total Protein: 6.2 g/dL (ref 6.0–8.5)
eGFR: 74 mL/min/{1.73_m2} (ref >59)

## 2020-05-27 LAB — CBC WITH DIFFERENTIAL/PLATELET
Basophils Absolute: 0.1 10*3/uL (ref 0.0–0.2)
Basos: 1 %
EOS (ABSOLUTE): 0.2 10*3/uL (ref 0.0–0.4)
Eos: 3 %
Hematocrit: 40.1 % (ref 34.0–46.6)
Hemoglobin: 12.5 g/dL (ref 11.1–15.9)
Immature Grans (Abs): 0 10*3/uL (ref 0.0–0.1)
Immature Granulocytes: 0 %
Lymphocytes Absolute: 1.4 10*3/uL (ref 0.7–3.1)
Lymphs: 25 %
MCH: 29.4 pg (ref 26.6–33.0)
MCHC: 31.2 g/dL — ABNORMAL LOW (ref 31.5–35.7)
MCV: 94 fL (ref 79–97)
Monocytes Absolute: 0.6 10*3/uL (ref 0.1–0.9)
Monocytes: 10 %
Neutrophils Absolute: 3.4 10*3/uL (ref 1.4–7.0)
Neutrophils: 61 %
Platelets: 258 10*3/uL (ref 150–450)
RBC: 4.25 x10E6/uL (ref 3.77–5.28)
RDW: 13.3 % (ref 11.7–15.4)
WBC: 5.6 10*3/uL (ref 3.4–10.8)

## 2020-05-29 DIAGNOSIS — H40013 Open angle with borderline findings, low risk, bilateral: Secondary | ICD-10-CM | POA: Diagnosis not present

## 2020-05-29 DIAGNOSIS — E119 Type 2 diabetes mellitus without complications: Secondary | ICD-10-CM | POA: Diagnosis not present

## 2020-05-29 DIAGNOSIS — H532 Diplopia: Secondary | ICD-10-CM | POA: Diagnosis not present

## 2020-05-29 DIAGNOSIS — H25013 Cortical age-related cataract, bilateral: Secondary | ICD-10-CM | POA: Diagnosis not present

## 2020-05-29 LAB — HM DIABETES EYE EXAM

## 2020-06-02 ENCOUNTER — Ambulatory Visit (INDEPENDENT_AMBULATORY_CARE_PROVIDER_SITE_OTHER): Payer: Medicare Other | Admitting: Physical Medicine and Rehabilitation

## 2020-06-02 ENCOUNTER — Other Ambulatory Visit: Payer: Self-pay

## 2020-06-02 ENCOUNTER — Encounter: Payer: Self-pay | Admitting: Physical Medicine and Rehabilitation

## 2020-06-02 ENCOUNTER — Ambulatory Visit: Payer: Self-pay

## 2020-06-02 VITALS — BP 137/71 | HR 106

## 2020-06-02 DIAGNOSIS — M47816 Spondylosis without myelopathy or radiculopathy, lumbar region: Secondary | ICD-10-CM

## 2020-06-02 MED ORDER — BETAMETHASONE SOD PHOS & ACET 6 (3-3) MG/ML IJ SUSP
12.0000 mg | Freq: Once | INTRAMUSCULAR | Status: AC
  Administered 2020-06-02: 12 mg

## 2020-06-02 NOTE — Patient Instructions (Signed)

## 2020-06-02 NOTE — Progress Notes (Signed)
Pt state lower back pain mostly on the left side. Pt state standing and riding on a bus for a long time makes the pain worse. Pt state take over the counter pain meds and excise to help ease the pain.   Pt has hx of inj on 05/21/20 pt state it was good.  Numeric Pain Rating Scale and Functional Assessment Average Pain 2   In the last MONTH (on 0-10 scale) has pain interfered with the following?  1. General activity like being  able to carry out your everyday physical activities such as walking, climbing stairs, carrying groceries, or moving a chair?  Rating(6)   +Driver, -BT, -Dye Allergies.

## 2020-06-05 ENCOUNTER — Telehealth: Payer: Self-pay | Admitting: Physical Medicine and Rehabilitation

## 2020-06-05 DIAGNOSIS — R270 Ataxia, unspecified: Secondary | ICD-10-CM

## 2020-06-05 NOTE — Telephone Encounter (Signed)
Pt called and states that she would like a referral sent over to [facility] and the fax number is [phone]

## 2020-06-05 NOTE — Telephone Encounter (Signed)
Please advise 

## 2020-06-06 NOTE — Telephone Encounter (Signed)
Can you put in referral to them, no specific person but if you need one use Dr. Dagoberto Ligas. It is for ataxia and wheelchair/mobility questions

## 2020-06-06 NOTE — Telephone Encounter (Signed)
Referral placed.

## 2020-06-06 NOTE — Addendum Note (Signed)
Addended by: Betsey Holiday on: 06/06/2020 08:23 AM   Modules accepted: Orders

## 2020-06-12 ENCOUNTER — Encounter: Payer: Self-pay | Admitting: Physical Medicine and Rehabilitation

## 2020-06-13 ENCOUNTER — Encounter: Payer: Self-pay | Admitting: Internal Medicine

## 2020-06-13 NOTE — Telephone Encounter (Signed)
I do see a copy of it was scan into her chart by her PCP

## 2020-06-19 ENCOUNTER — Encounter: Payer: Self-pay | Admitting: Internal Medicine

## 2020-06-19 ENCOUNTER — Other Ambulatory Visit: Payer: Self-pay

## 2020-06-19 ENCOUNTER — Ambulatory Visit (INDEPENDENT_AMBULATORY_CARE_PROVIDER_SITE_OTHER): Payer: Medicare Other | Admitting: Internal Medicine

## 2020-06-19 VITALS — BP 140/76 | HR 88 | Ht 70.0 in | Wt 182.2 lb

## 2020-06-19 DIAGNOSIS — E109 Type 1 diabetes mellitus without complications: Secondary | ICD-10-CM

## 2020-06-19 DIAGNOSIS — Z9884 Bariatric surgery status: Secondary | ICD-10-CM

## 2020-06-19 DIAGNOSIS — E785 Hyperlipidemia, unspecified: Secondary | ICD-10-CM | POA: Diagnosis not present

## 2020-06-19 LAB — POCT GLYCOSYLATED HEMOGLOBIN (HGB A1C): Hemoglobin A1C: 6.7 % — AB (ref 4.0–5.6)

## 2020-06-19 LAB — LIPID PANEL
Cholesterol: 158 mg/dL (ref 0–200)
HDL: 56 mg/dL (ref >39.00)
LDL Cholesterol: 79 mg/dL (ref 0–99)
NonHDL: 102.29
Total CHOL/HDL Ratio: 3
Triglycerides: 114 mg/dL (ref 0.0–149.0)
VLDL: 22.8 mg/dL (ref 0.0–40.0)

## 2020-06-19 LAB — T4, FREE: Free T4: 0.77 ng/dL (ref 0.60–1.60)

## 2020-06-19 LAB — TSH: TSH: 0.79 u[IU]/mL (ref 0.35–4.50)

## 2020-06-19 LAB — MICROALBUMIN / CREATININE URINE RATIO
Creatinine,U: 84.7 mg/dL
Microalb Creat Ratio: 0.8 mg/g (ref 0.0–30.0)
Microalb, Ur: <0.7 mg/dL (ref 0.0–1.9)

## 2020-06-19 MED ORDER — NOVOLOG PENFILL 100 UNIT/ML ~~LOC~~ SOCT: SUBCUTANEOUS | 3 refills | Status: DC

## 2020-06-19 MED ORDER — INPEN 100-BLUE-NOVO DEVI: 1.0000 | Freq: Once | 0 refills | Status: AC

## 2020-06-19 MED ORDER — TRESIBA FLEXTOUCH 100 UNIT/ML ~~LOC~~ SOPN: 16.0000 [IU] | PEN_INJECTOR | Freq: Every day | SUBCUTANEOUS | 3 refills | Status: DC

## 2020-06-19 NOTE — Procedures (Signed)
Lumbar Facet Joint Nerve Denervation  Patient: Joyce Brown      Date of Birth: 1950-10-27 MRN: [id number] PCP: Lind Covert, MD      Visit Date: 06/02/2020   Universal Protocol:    Date/Time: 04/07/225:25 AM  Consent Given By: the patient  Position: PRONE  Additional Comments: Vital signs were monitored before and after the procedure. Patient was prepped and draped in the usual sterile fashion. The correct patient, procedure, and site was verified.   Injection Procedure Details:   Procedure diagnoses:  1. Spondylosis without myelopathy or radiculopathy, lumbar region      Meds Administered:  Meds ordered this encounter  Medications  . betamethasone acetate-betamethasone sodium phosphate (CELESTONE) injection 12 mg     Laterality: Left  Location/Site:  L3-L4, L2 and L3 medial branches, L4-L5, L3 and L4 medial branches and L5-S1, L4 medial branch and L5 dorsal ramus  Needle: 18 ga.,  31mm active tip RF Cannula  Needle Placement: Along juncture of superior articular process and transverse pocess  Findings:  -Comments:  Procedure Details: For each desired target nerve, the corresponding transverse process (sacral ala for the L5 dorsal rami) was identified and the fluoroscope was positioned to square off the endplates of the corresponding vertebral body to achieve a true AP midline view.  The beam was then obliqued 15 to 20 degrees and caudally tilted 15 to 20 degrees to line up a trajectory along the target nerves. The skin over the target of the junction of superior articulating process and transverse process (sacral ala for the L5 dorsal rami) was infiltrated with 33ml of 1% Lidocaine without Epinephrine.  The 18 gauge 30mm active tip outer cannula was advanced in trajectory view to the target.  This procedure was repeated for each target nerve.  Then, for all levels, the outer cannula placement was fine-tuned and the position was then confirmed with  bi-planar imaging.    Test stimulation was done both at sensory and motor levels to ensure there was no radicular stimulation. The target tissues were then infiltrated with 1 ml of 1% Lidocaine without Epinephrine. Subsequently, a percutaneous neurotomy was carried out for 90 seconds at 80 degrees Celsius.  After the completion of the lesion, 1 ml of injectate was delivered. It was then repeated for each facet joint nerve mentioned above. Appropriate radiographs were obtained to verify the probe placement during the neurotomy.   Additional Comments:  The patient tolerated the procedure well Dressing: 2 x 2 sterile gauze and Band-Aid    Post-procedure details: Patient was observed during the procedure. Post-procedure instructions were reviewed.  Patient left the clinic in stable condition.

## 2020-06-19 NOTE — Progress Notes (Signed)
Sheyanne Munley - 70 y.o. female MRN [id number]  Date of birth: 1950-04-26  Office Visit Note: Visit Date: 06/02/2020 PCP: Lind Covert, MD Referred by: Lind Covert, *  Subjective: Chief Complaint  Patient presents with  . Lower Back - Pain   HPI:  Tonga Prout is a 70 y.o. female who comes in today for planned radiofrequency ablation of the Left L3-L4, L4-L5 and L5-S1 Lumbar facet joints. This would be ablation of the corresponding medial branches and/or dorsal rami.  Patient has had double diagnostic blocks with more than 50% relief.  These are documented on pain diary.  They have had chronic back pain for quite some time, more than 3 months, which has been an ongoing situation with recalcitrant axial back pain.  They have no radicular pain.  Their axial pain is worse with standing and ambulating and on exam today with facet loading.  They have had physical therapy as well as home exercise program.  The imaging noted in the chart below indicated facet pathology. Accordingly they meet all the criteria and qualification for for radiofrequency ablation and we are going to complete this today hopefully for more longer term relief as part of comprehensive management program.  ROS Otherwise per HPI.  Assessment & Plan: Visit Diagnoses:    ICD-10-CM   1. Spondylosis without myelopathy or radiculopathy, lumbar region  M47.816 XR C-ARM NO REPORT    Radiofrequency,Lumbar    betamethasone acetate-betamethasone sodium phosphate (CELESTONE) injection 12 mg    Plan: No additional findings.   Meds & Orders:  Meds ordered this encounter  Medications  . betamethasone acetate-betamethasone sodium phosphate (CELESTONE) injection 12 mg    Orders Placed This Encounter  Procedures  . Radiofrequency,Lumbar  . XR C-ARM NO REPORT    Follow-up: Return if symptoms worsen or fail to improve.   Procedures: No procedures performed  Lumbar Facet Joint Nerve  Denervation  Patient: Maricella Filyaw      Date of Birth: 07-Apr-1950 MRN: [id number] PCP: Lind Covert, MD      Visit Date: 06/02/2020   Universal Protocol:    Date/Time: 04/07/225:25 AM  Consent Given By: the patient  Position: PRONE  Additional Comments: Vital signs were monitored before and after the procedure. Patient was prepped and draped in the usual sterile fashion. The correct patient, procedure, and site was verified.   Injection Procedure Details:   Procedure diagnoses:  1. Spondylosis without myelopathy or radiculopathy, lumbar region      Meds Administered:  Meds ordered this encounter  Medications  . betamethasone acetate-betamethasone sodium phosphate (CELESTONE) injection 12 mg     Laterality: Left  Location/Site:  L3-L4, L2 and L3 medial branches, L4-L5, L3 and L4 medial branches and L5-S1, L4 medial branch and L5 dorsal ramus  Needle: 18 ga.,  21m active tip RF Cannula  Needle Placement: Along juncture of superior articular process and transverse pocess  Findings:  -Comments:  Procedure Details: For each desired target nerve, the corresponding transverse process (sacral ala for the L5 dorsal rami) was identified and the fluoroscope was positioned to square off the endplates of the corresponding vertebral body to achieve a true AP midline view.  The beam was then obliqued 15 to 20 degrees and caudally tilted 15 to 20 degrees to line up a trajectory along the target nerves. The skin over the target of the junction of superior articulating process and transverse process (sacral ala for the L5 dorsal rami) was infiltrated with  41m of 1% Lidocaine without Epinephrine.  The 18 gauge 116mactive tip outer cannula was advanced in trajectory view to the target.  This procedure was repeated for each target nerve.  Then, for all levels, the outer cannula placement was fine-tuned and the position was then confirmed with bi-planar imaging.    Test  stimulation was done both at sensory and motor levels to ensure there was no radicular stimulation. The target tissues were then infiltrated with 1 ml of 1% Lidocaine without Epinephrine. Subsequently, a percutaneous neurotomy was carried out for 90 seconds at 80 degrees Celsius.  After the completion of the lesion, 1 ml of injectate was delivered. It was then repeated for each facet joint nerve mentioned above. Appropriate radiographs were obtained to verify the probe placement during the neurotomy.   Additional Comments:  The patient tolerated the procedure well Dressing: 2 x 2 sterile gauze and Band-Aid    Post-procedure details: Patient was observed during the procedure. Post-procedure instructions were reviewed.  Patient left the clinic in stable condition.        Clinical History: MRI LUMBAR SPINE WITHOUT CONTRAST  COMPARISON: 03/05/2020 and prior.  FINDINGS: Segmentation: Standard.  Alignment: Lumbar dextrocurvature. Slight reversal of lordosis. Minimal grade 1 L4-5 retrolisthesis. Minimal grade 1 L2-3, L3-4 anterolisthesis.  Vertebrae: Vertebral body heights are preserved. Multilevel Modic type 1/2 endplate degenerative changes. No focal osseous lesion.  Disc levels: Multilevel desiccation and Schmorl's node formation.  L1-2: Disc bulge with superimposed left foraminal protrusion. Bilateral facet hypertrophy. Patent right neural foramen. Mild spinal canal and severe left neural foraminal narrowing.  L2-3: Disc bulge with superimposed central and left foraminal protrusions. Bilateral facet hypertrophy. Patent right neural foramen. Mild spinal canal and left neural foraminal narrowing.  L3-4: Disc bulge with shallow central and bilateral foraminal protrusions. Small left extraforaminal protrusion. Bilateral facet hypertrophy. Patent spinal canal. Mild bilateral neural foraminal narrowing.  L4-5: Disc bulge with superimposed right foraminal and shallow  right paracentral protrusions abutting the descending right L5 nerve root. Bilateral facet hypertrophy. Mild spinal canal, moderate right and mild left neural foraminal narrowing.  L5-S1: Disc bulge with superimposed right foraminal protrusion. Bilateral facet hypertrophy. Patent spinal canal. Mild bilateral neural foraminal narrowing.  Paraspinal and other soft tissues: Small right renal cysts.  IMPRESSION: Multilevel spondylosis. Lumbar dextrocurvature and slight reversal of lordosis.  Severe left L1-2 and moderate right L4-5 neural foraminal narrowing.  Mild spinal canal narrowing at the L1-3, L4-5 levels.  Mild left L2-3, bilateral L3-4, left L4-5 and bilateral L5-S1 neural foraminal narrowing.   By: ChPrimitivo Gauze.D. On: 04/03/2020 09:19 --- LUMBAR/SACRAL MEDIAL BRANCH RADIOFREQUENCY  LyMADEEHA COSTANTINO0/[birth year]Pre-op Diagnosis: Lumbar Spondylosis Nurse: LeEulis Cannerppiani Doctor: ArTheodis SatoAnesthesia: local  Patient has been referred to the pain clinic for radiofrequency treatment of chronic axial back pain. Patient has had long-standing back pain thought to be facet joint generated and which has been refractory to other therapies. Local anesthetic medial branch blocks or intra-articular facet joint injections resulted in Patient reporting a greater than 50% reduction of the usual axial component of pain for at least the duration of the local anesthetic effect. The printed consent form was signed and witnessed.  Patient was placed in the prone position on the fluoroscopy table and automated blood pressure cuff and pulse oximeter applied. The skin entry points for approaching the anatomic target points of the segmental medial branches of bilateral, L3, L4 and L5 MB were identified with a  22.5 degree from perpendicular lateral oblique fluoroscopic view and marked. Following thorough ChloraPrep preparation of the skin and draping and 1%  lidocaine infiltration of the skin entry points and subcutaneous tissues, three radiofrequency cannulae, 20-gauge 10 curved were placed under fluoroscopic guidance along or across the anatomic course of each respective segmental medial branch. Each placement was stimulated at _0  and up to 4v without any evidence of distal muscle stimulation. At each placement a pulse mode (_1 , 52mec) radiofrequency treatment was done at 80 degree C for 90sec.  Finally 13 mg of Depo-Medrol was added at each site.  Patient's vital signs were stable throughout the procedure and were as recorded in nursing records. Follow up plans and appointments were discussed with patient. Post procedure instruction was given as documented in nursing records and having met discharge criteria, Pt was discharged to the post operative care.  AJaneth Rase MD     Objective:  VS:  HT:    WT:   BMI:     BP:137/71  HR:(!) 106bpm  TEMP: ( )  RESP:  Physical Exam Vitals and nursing note reviewed.  Constitutional:      General: She is not in acute distress.    Appearance: Normal appearance. She is not ill-appearing.  HENT:     Head: Normocephalic and atraumatic.     Right Ear: External ear normal.     Left Ear: External ear normal.  Eyes:     Extraocular Movements: Extraocular movements intact.  Cardiovascular:     Rate and Rhythm: Normal rate.     Pulses: Normal pulses.  Pulmonary:     Effort: Pulmonary effort is normal. No respiratory distress.  Abdominal:     General: There is no distension.     Palpations: Abdomen is soft.  Musculoskeletal:        General: Tenderness present.     Cervical back: Neck supple.     Right lower leg: No edema.     Left lower leg: No edema.     Comments: Patient has good distal strength with no pain over the greater trochanters.  No clonus or focal weakness. Patient somewhat slow to rise from a seated position to full extension.  There is concordant low back pain with facet loading  and lumbar spine extension rotation.  There are no definitive trigger points but the patient is somewhat tender across the lower back and PSIS.  There is no pain with hip rotation.   Skin:    Findings: No erythema, lesion or rash.  Neurological:     General: No focal deficit present.     Mental Status: She is alert and oriented to person, place, and time.     Sensory: No sensory deficit.     Motor: No weakness or abnormal muscle tone.     Coordination: Coordination normal.     Gait: Gait abnormal.     Comments: ataxia  Psychiatric:        Mood and Affect: Mood normal.        Behavior: Behavior normal.      Imaging: No results found.

## 2020-06-19 NOTE — Patient Instructions (Signed)
-   Continue Tresiba 16 units daily  - Novolog 8 units with Breakfast , 13 units with Lunch and 9 units supper    HOW TO TREAT LOW BLOOD SUGARS (Blood sugar LESS THAN 70 MG/DL)  Please follow the RULE OF 15 for the treatment of hypoglycemia treatment (when your (blood sugars are less than 70 mg/dL)    STEP 1: Take 15 grams of carbohydrates when your blood sugar is low, which includes:   3-4 GLUCOSE TABS  OR  3-4 OZ OF JUICE OR REGULAR SODA OR  ONE TUBE OF GLUCOSE GEL     STEP 2: RECHECK blood sugar in 15 MINUTES STEP 3: If your blood sugar is still low at the 15 minute recheck --> then, go back to STEP 1 and treat AGAIN with another 15 grams of carbohydrates.

## 2020-06-19 NOTE — Progress Notes (Signed)
Name: Joyce Brown  Age/ Sex: 70 y.o., female   MRN/ DOB: [id number], 10-30-1950     PCP: Lind Covert, MD   Reason for Endocrinology Evaluation: Type 1 Diabetes Mellitus  Initial Endocrine Consultative Visit: 01/17/2018    PATIENT IDENTIFIER: Joyce Brown is a 70 y.o. female with a past medical history of T1DM, cerebellar atrophy, asthma, OSA on CPAP and Hx of gastric bypass in 2014  . The patient has followed with Endocrinology clinic since 01/17/18 for consultative assistance with management of her diabetes.  Patient moved from [place] in September, 2019 to be close to her sister. She has been diagnosed with stiff person Syndrome but Dr. Jannifer Franklin thought this may be more of cerebellar atrophy. She was on  IVIG until 11/07/2018. 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 She has only had one DKA episode which was in 11/2017. Marland Kitchen Her hemoglobin A1c has ranged from 8.4% in 01/2017, peaking at 12.1 % in 01/2016  She is followed by Dr. 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 She has been off IVIG since 02/2018. He has been tapering her diazepam down and is currently on cellcept.    Started using the inpen 07/2018  SUBJECTIVE:   During the last visit (10/19/2019): A1c 6.9 % . We adjusted MDI regimen     Today (06/19/2020): Joyce Brown is here for a 4 months follow up visit on diabetes.  She checks her blood sugars multiple times daily, using  CGM (Dexcom). The patient has had hypoglycemic episodes since the last clinic visit. The patient is symptomatic with these episodes.   Denies nausea or diarrhea    HOME DIABETES REGIMEN:  Tresiba 16 units QHS Novolog 8 units  With Breakfast, 13 units with lunch and 9 units with supper  CF (BG - 110/60)    CONTINUOUS GLUCOSE MONITORING RECORD  INTERPRETATION    Dates of Recording:3/25-06/19/2020 Sensor description:Dexcom  Results statistics:   CGM use % of time 93  Average and SD 158/59  Time in range  67 %  % Time Above 180 22  % Time above 250 7  % Time Below target 3    Glycemic patterns summary: Hyperglycemia post prandial but Bg's trends down after meals  Hypoglycemic episodes occurred occasional post bolus   Overnight periods: stable   IN-Pen report 3/24-06/18/2020  Average 161 Missed doses:  2 SD 60 TDD 44.8 units    DIABETIC COMPLICATIONS: Microvascular complications:    Denies: retinopathy, neuroapthy, CKD  Last eye exam: Completed 05/29/2020  Macrovascular complications:    Denies: CAD, PVD, CVA       HISTORY:  Past Medical History:  Past Medical History:  Diagnosis Date  . Asthma   . Cerebellar ataxia ([place])   . Diabetes mellitus without complication ([place])   . Gait abnormality 11/07/2018  . Glaucoma   . Hyperlipidemia   . Hypertension   . Pulmonary embolism ([place])   . Sleep apnea   . Spinal stenosis    Past Surgical History:  Past Surgical History:  Procedure Laterality Date  . ABDOMINAL HYSTERECTOMY    . ADENOIDECTOMY    . ANKLE SURGERY Left   . BILATERAL CARPAL TUNNEL RELEASE    . BREAST BIOPSY Left 2019   benign  . ELBOW SURGERY    . GASTRIC BYPASS  2014  . HAND NEUROPLASTY    . radio denervation  05/21/2020  . TONSILLECTOMY      Social History:  reports that she has never smoked. She has never used smokeless tobacco. She reports that she does not drink alcohol and does not use drugs. Family History:  Family History  Problem Relation Age of Onset  . Cervical cancer Mother   . Ovarian cancer Mother   . Dementia Father   . Retinal detachment Father   . Heart attack Father   . Diabetes Father   . Stroke Father   . Hypertension Father   . Glaucoma Paternal Grandfather   . Ovarian cancer Sister      HOME MEDICATIONS: Allergies as of 06/19/2020   No Known  Allergies     Medication List       Accurate as of June 19, 2020 10:38 AM. If you have any questions, ask your nurse or doctor.        acetaminophen 325 MG tablet Commonly known as: TYLENOL Take 650 mg by mouth every 6 (six) hours as needed for moderate pain.   atorvastatin 40 MG tablet Commonly known as: LIPITOR Take 1 tablet (40 mg total) by mouth daily at 6 PM.   B-D UF III MINI PEN NEEDLES 31G X 5 MM Misc Generic drug: Insulin Pen Needle Four times a day   Calcium Citrate 250 MG Tabs Take 2 tablets (500 mg total) by mouth 2 (two) times daily.   Dexcom G6 Sensor Misc 1 each by Other route daily.   glucose blood test strip ASCENSIA CONTOUR NEXT ONE: Use 1 strip to check blood sugar daily   InPen 100-Blue-Novo Devi Generic drug: injection device for insulin 1 Device by Other route once for 1 dose. What changed:   how much to take  how to take this  when to take this  additional instructions Changed by: Dorita Sciara, MD   Insulin Syringe-Needle U-100 31G X 15/64" 0.5 ML Misc 1 Device by Does not apply route daily.   Microlet Lancets Misc Twice a day What changed:   how much to take  how to take this  when to take this  additional instructions   multivitamin capsule Take 4 capsules by mouth daily. Bariatric fusion   mycophenolate 500 MG tablet Commonly known as: CELLCEPT Take 2 tablets (1,000 mg total) by mouth 2 (two) times daily.   NovoLOG PenFill cartridge Generic drug: insulin aspart MAX DAILY DOSE OF 60 UNITS per correction scale What changed: additional instructions Changed by: Dorita Sciara, MD   oxybutynin 5 MG tablet Commonly known as: DITROPAN TAKE ONE TABLET BY MOUTH TWICE A DAY   sertraline 50 MG tablet Commonly known as: ZOLOFT 1-2 by mouth daily   tiZANidine 2 MG tablet Commonly known as: ZANAFLEX TAKE ONE TO TWO TABLETS BY MOUTH AT BEDTIME AS NEEDED FOR MUSCLE SPASMS   Tresiba FlexTouch 100 UNIT/ML  FlexTouch Pen Generic drug: insulin degludec Inject 0.17 mLs (17 Units total) into the skin daily.   Vitamin D3 75 MCG (3000 UT) Tabs Take 75 mcg by mouth daily.       PHYSICAL EXAM: VS: BP 140/76   Pulse 88   Ht 5\' 10"  (1.778 m)   Wt 182 lb 4 oz (82.7 kg)   SpO2 98%   BMI 26.15 kg/m    EXAM: General: Pt appears well and is in NAD  Lungs: Clear with good BS bilat with no rales, rhonchi, or wheezes  Heart: Auscultation: RRR   Extremities:  BL LE: no pretibial edema   Mental Status: Judgment, insight: intact Mood and affect: no depression, anxiety, or agitation   DM Foot Exam 06/19/2020 The skin of the feet is intact without sores or ulcerations. The pedal pulses are 2+ on right and 2+ on left. The sensation is intact to a screening 5.07, 10 gram monofilament bilaterally     DATA REVIEWED:  Lab Results  Component Value Date   HGBA1C 6.7 (A) 06/19/2020   HGBA1C 6.9 (A) 02/18/2020   HGBA1C 6.9 (A) 10/19/2019   Lab Results  Component Value Date   MICROALBUR 3.1 (H) 04/19/2019   LDLCALC 102 (H) 01/17/2019   CREATININE 0.86 05/26/2020    Results for Joyce Brown, Joyce Brown (MRN [id number]) as of 06/20/2020 10:50  Ref. Range 06/19/2020 10:53  Total CHOL/HDL Ratio Unknown 3  Cholesterol Latest Ref Range: 0 - 200 mg/dL 158  HDL Cholesterol Latest Ref Range: >39.00 mg/dL 56.00  LDL (calc) Latest Ref Range: 0 - 99 mg/dL 79  MICROALB/CREAT RATIO Latest Ref Range: 0.0 - 30.0 mg/g 0.8  NonHDL Unknown 102.29  Triglycerides Latest Ref Range: 0.0 - 149.0 mg/dL 114.0  VLDL Latest Ref Range: 0.0 - 40.0 mg/dL 22.8  TSH Latest Ref Range: 0.35 - 4.50 uIU/mL 0.79  T4,Free(Direct) Latest Ref Range: 0.60 - 1.60 ng/dL 0.77  Creatinine,U Latest Units: mg/dL 84.7  Microalb, Ur Latest Ref Range: 0.0 - 1.9 mg/dL <0.7     ASSESSMENT / PLAN / RECOMMENDATIONS:   1) Type 1 Diabetes Mellitus, Optimally  Controlled - Most recent A1c of 6.7 %. Goal A1c < 7.5 %.      - Pt is sensitive to  insulin as well as carbohydrate which is not unusual with autoimmune diabetes, but also makes it a bit challenging to reach optimal glucose readings, another contributing factor is her hx of gastric bypass which causes variability in absorption . - No overnight hypoglycemia  - Refilled novolog and inpen  - NO changes  - A portal message was sent to adjust dose after taking steroid injection to increase prandial dose by 2 units and decrease SF to 50 for just 1.5 days after the injection.    MEDICATIONS:  Tresiba 16 units daily   Continue Novolog 8 units with breakfast, 13 units with lunch  , and 9 units with  supper   Continue  Sensitivity factor 60    EDUCATION / INSTRUCTIONS:  BG monitoring instructions: Patient is instructed to check her blood sugars 4 times a day with CGM caliberation.  Call [facility] clinic if: BG persistently < 70  . I reviewed the Rule of 15 for the treatment of hypoglycemia in detail with the patient.     2) Diabetic complications:   Eye: Does not have known diabetic retinopathy.   Neuro/ Feet: Does not have known diabetic peripheral neuropathy.  Renal: Patient does not have known baseline CKD. She is not on an ACEI/ARB at present. Normal microalbuminuria .  3) Dyslipidemia :  - She is on Atorvastatin 40 mg daily  - LDL at goal  - NO changes      F/U in 6 months    Signed electronically by: Mack Guise, MD  [facility] End[facility]in[facility]  [facility] [address] [place], [place] [address] Phone: [phone] FAX: [phone]   CC: Lind Covert, [address] [place] [address] Phone: [phone]  Fax: [phone]  Return to Endocrinology clinic  as below: Future Appointments  Date Time Provider [place]  08/01/2020  9:40 AM Courtney Heys, MD [facility] CPR  08/19/2020  8:45 AM Marzetta Board, DPM [facility]  11/21/2020  9:00 AM Clydell Hakim,  RD [place] [facility]  12/09/2020  9:30 AM Kathrynn Ducking, MD [facility] None

## 2020-06-22 ENCOUNTER — Encounter: Payer: Self-pay | Admitting: Family Medicine

## 2020-06-27 ENCOUNTER — Encounter: Payer: Self-pay | Admitting: Internal Medicine

## 2020-06-27 ENCOUNTER — Other Ambulatory Visit: Payer: Self-pay | Admitting: Family Medicine

## 2020-06-28 ENCOUNTER — Encounter: Payer: Self-pay | Admitting: Internal Medicine

## 2020-06-30 DIAGNOSIS — H903 Sensorineural hearing loss, bilateral: Secondary | ICD-10-CM | POA: Diagnosis not present

## 2020-06-30 DIAGNOSIS — H9313 Tinnitus, bilateral: Secondary | ICD-10-CM | POA: Diagnosis not present

## 2020-07-01 ENCOUNTER — Ambulatory Visit (INDEPENDENT_AMBULATORY_CARE_PROVIDER_SITE_OTHER): Payer: Medicare Other

## 2020-07-01 ENCOUNTER — Other Ambulatory Visit: Payer: Self-pay

## 2020-07-01 DIAGNOSIS — Z23 Encounter for immunization: Secondary | ICD-10-CM | POA: Diagnosis not present

## 2020-07-01 NOTE — Progress Notes (Signed)
   Covid-19 Vaccination Clinic  Name:  Barba Solt    MRN: [id number] DOB: [birth year]  07/01/2020  Ms. Ergle was observed post Covid-19 immunization for 15 minutes without incident. She was provided with Vaccine Information Sheet and instruction to access the V-Safe system.   Ms. Dumais was instructed to call 911 with any severe reactions post vaccine: Marland Kitchen Difficulty breathing  . Swelling of face and throat  . A fast heartbeat  . A bad rash all over body  . Dizziness and weakness   Immunizations Administered    Name Date Dose VIS Date Route   PFIZER Comrnaty(Gray TOP) Covid-19 Vaccine 07/01/2020  9:05 AM 0.3 mL 02/21/2020 Intramuscular   Manufacturer: [place]   Lot: [id number]   NDC: [address]-4     Talbot Grumbling, RN

## 2020-07-23 ENCOUNTER — Encounter: Payer: Self-pay | Admitting: Internal Medicine

## 2020-07-24 ENCOUNTER — Other Ambulatory Visit: Payer: Self-pay | Admitting: Internal Medicine

## 2020-07-25 ENCOUNTER — Encounter: Payer: Self-pay | Admitting: Internal Medicine

## 2020-07-30 ENCOUNTER — Other Ambulatory Visit: Payer: Self-pay | Admitting: Family Medicine

## 2020-07-30 ENCOUNTER — Other Ambulatory Visit: Payer: Self-pay | Admitting: Emergency Medicine

## 2020-07-30 DIAGNOSIS — Z1231 Encounter for screening mammogram for malignant neoplasm of breast: Secondary | ICD-10-CM

## 2020-08-01 ENCOUNTER — Encounter: Payer: Self-pay | Admitting: Physical Medicine and Rehabilitation

## 2020-08-01 ENCOUNTER — Other Ambulatory Visit: Payer: Self-pay

## 2020-08-01 ENCOUNTER — Encounter
Payer: Medicare Other | Attending: Physical Medicine and Rehabilitation | Admitting: Physical Medicine and Rehabilitation

## 2020-08-01 VITALS — BP 131/73 | HR 66 | Temp 98.4°F | Ht 70.0 in | Wt 185.4 lb

## 2020-08-01 DIAGNOSIS — M5442 Lumbago with sciatica, left side: Secondary | ICD-10-CM | POA: Diagnosis not present

## 2020-08-01 DIAGNOSIS — E108 Type 1 diabetes mellitus with unspecified complications: Secondary | ICD-10-CM | POA: Insufficient documentation

## 2020-08-01 DIAGNOSIS — G8929 Other chronic pain: Secondary | ICD-10-CM | POA: Diagnosis not present

## 2020-08-01 DIAGNOSIS — M5441 Lumbago with sciatica, right side: Secondary | ICD-10-CM | POA: Insufficient documentation

## 2020-08-01 DIAGNOSIS — M797 Fibromyalgia: Secondary | ICD-10-CM | POA: Diagnosis not present

## 2020-08-01 DIAGNOSIS — G119 Hereditary ataxia, unspecified: Secondary | ICD-10-CM | POA: Insufficient documentation

## 2020-08-01 DIAGNOSIS — E1065 Type 1 diabetes mellitus with hyperglycemia: Secondary | ICD-10-CM | POA: Insufficient documentation

## 2020-08-01 DIAGNOSIS — IMO0002 Reserved for concepts with insufficient information to code with codable children: Secondary | ICD-10-CM

## 2020-08-01 NOTE — Patient Instructions (Signed)
Pt is a 70 yr old female with DM type I- A1c 6.7; tinnitus;   HTN has resolved; (was previously dx'd with stiff man's syndrome- doesn't have it). Has Cerebellar atrophy with ataxia and also chronic low back pain s/p radiofrequency ablation 3/22;  Here for ataxia and w/c seating eval.   1. Will write order for new pressure relieving w/c cushion-suggest ROHO hybrid to help with better posture/reduce low back pain.   2. Also will try to get her a new w/c back- hers is not fitting ocrrectly and has no adjustment feature- suggest Comfort- Acta relief cushion so can be adjusted and give more lumbar support.   3. Pt meets criteria for ROHO cushion due to hx of pressure ulcer- Stage II  That was documented- didn't heal well- kept reopening (has DM as well).  Pt also meets criteria for w/c seating changes due to being 3 years out from initial w/c delivery- needs due to some soreness.redness where had previous pressure ulcer-  And overall buttocks. Also  Having constant low back pain, to the point has required radiofrequency ablation for pain and still not resolved.   4.  Doesn't need me to prescribe Zanaflex-  Has PCP writing for it- con't meds.   5. Will have her come back for trigger point injections- will help pain better and reduce risk of needing strong pain meds.   6. Is on Sertraline for FMS Sx's- is helpful.   7. F/U- in 6-8 weeks for trigger point injections. Will also send Rx's for PT eval as well as Cushion and back for w/c to [facility].

## 2020-08-01 NOTE — Progress Notes (Signed)
Subjective:    Patient ID: Joyce Brown, female    DOB: 06/15/1950, 70 y.o.   MRN: [id number]  HPI   Pt is a 70 yr old female with DM- type I- A1c 6.7; tinnitus;   HTN has resolved; (was previously dx'd with stiff man's syndrome- doesn't have it). Has Cerebellar atrophy with ataxia and also chronic low back pain s/p radiofrequency ablation 3/22;  Here for ataxia and w/c seating eval.   Doesn't know when BGs go low.    Has a current manual w/c- got in May 2019. From [facility] in [place]- and has hooked up with them for maintenance.   W/C-gotten through insurance- can replace in May 2024 xCore from [employer].  Propels w/c via legs not really arms.  Thinks issue might be able to  be solved with new cushion . Pushes gel back into place every time she uses it.    Transportation for SCAT has become a problem-  But usually goes to the pool- until April- 2x/week and stretching extensively.   Also has pool in apartment complex and can now use- and plans on using 4-5x/week (did last summer).  Hunches over a lot- but flotation belt really helps.  4.5 to 5 feet is her usual depth she works at.    Back problems really kicked up again- was using rec pool 3.5 feet deep- in December.   Has a lot of trigger points- has had trigger point injections in past with pretty decent results.   More tendency to fall if uses RW to walk.  Takes Zanaflex 2-4 mg nightly for back spasms and lumbar pain.     Social Hx: Can get into bathroom with w/c Raised toilet seat- RW to transfer  Uses coccyx pillow at home with out of w/c.  Uses recliner at home when not in w/c. Raised up base so is elevated to get in and out of.  Has adjustable bed- helps back pain.  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  Has lifeline at home- hasn't needed to use lately.     Pain Inventory Average Pain 6 Pain Right Now 8 My pain is sharp, burning, dull, stabbing, tingling, aching and  pricking  In the last 24 hours, has pain interfered with the following? General activity 8 Relation with others 0 Enjoyment of life 8 What TIME of day is your pain at its worst? varies Sleep (in general) Fair  Pain is worse with: walking, bending, sitting, standing and reachng while sitting Pain improves with: therapy/exercise, pacing activities and medication Relief from Meds: 6  walk with assistance use a walker how many minutes can you walk? 1-3 ability to climb steps?  no do you drive?  no use a wheelchair transfers alone  disabled: date disabled . retired  bladder control problems weakness trouble walking depression anxiety  New pat  New pt    Family History  Problem Relation Age of Onset  . Cervical cancer Mother   . Ovarian cancer Mother   . Dementia Father   . Retinal detachment Father   . Heart attack Father   . Diabetes Father   . Stroke Father   . Hypertension Father   . Glaucoma Paternal Grandfather   . Ovarian cancer Sister    Social History   Socioeconomic History  . Marital status: Single    Spouse name: Not on file  . Number of children: Not on file  . Years of education: Not on file  .  Highest education level: Not on file  Occupational History  . Not on file  Tobacco Use  . Smoking status: Never Smoker  . Smokeless tobacco: Never Used  Vaping Use  . Vaping Use: Never used  Substance and Sexual Activity  . Alcohol use: Never  . Drug use: Never  . Sexual activity: Not on file  Other Topics Concern  . Not on file  Social History Narrative   Lives at home alone   Right handed   Drinks 2-3 cups caffeine daily   Social Determinants of Health   Financial Resource Strain: Not on file  Food Insecurity: Not on file  Transportation Needs: Not on file  Physical Activity: Not on file  Stress: Not on file  Social Connections: Not on file   Past Surgical History:  Procedure Laterality Date  . ABDOMINAL HYSTERECTOMY    .  ADENOIDECTOMY    . ANKLE SURGERY Left   . BILATERAL CARPAL TUNNEL RELEASE    . BREAST BIOPSY Left 2019   benign  . ELBOW SURGERY    . GASTRIC BYPASS  2014  . HAND NEUROPLASTY    . radio denervation  05/21/2020  . TONSILLECTOMY     Past Medical History:  Diagnosis Date  . Asthma   . Cerebellar ataxia ([place])   . Diabetes mellitus without complication ([place])   . Gait abnormality 11/07/2018  . Glaucoma   . Hyperlipidemia   . Hypertension   . Pulmonary embolism ([place])   . Sleep apnea   . Spinal stenosis    BP 131/73   Pulse 66   Temp 98.4 F (36.9 C)   Ht 5\' 10"  (1.778 m)   Wt 185 lb 6.4 oz (84.1 kg)   SpO2 97%   BMI 26.60 kg/m   Opioid Risk Score:   Fall Risk Score:  `1  Depression screen PHQ 2/9  Depression screen [facility] 2/9 08/01/2020 08/15/2019 06/27/2019 01/04/2019 08/17/2018 12/28/2017 12/06/2017  Decreased Interest 1 0 0 0 0 0 0  Down, Depressed, Hopeless 0 0 0 0 0 0 0  PHQ - 2 Score 1 0 0 0 0 0 0  Altered sleeping 1 - - - - - -  Tired, decreased energy 1 - - - - - -  Change in appetite 0 - - - - - -  Feeling bad or failure about yourself  0 - - - - - -  Trouble concentrating 0 - - - - - -  Moving slowly or fidgety/restless 0 - - - - - -  Suicidal thoughts 0 - - - - - -  PHQ-9 Score 3 - - - - - -  Difficult doing work/chores Somewhat difficult - - - - - -     Review of Systems  Musculoskeletal: Positive for back pain and gait problem.  Neurological: Positive for weakness.  Psychiatric/Behavioral: Positive for dysphoric mood. The patient is nervous/anxious.   All other systems reviewed and are negative.      Objective:   Physical Exam Awake, alert, appropriate; in manual w/c, halting/searching slightly slurred voice occ, NAD  Neuro: RUE no searching pattern with finger ot nose, however L side is.    MS: 5/5 in UEs B/L in biceps, triceps, grip and finger abd LEs- 5-/5 in HF, KE, KF DF and PF B/L So very slightly weak. More notable in HF (does squat/standing  at counter at home)  Has trigger points palpated in upper back, neck shoulders and low back as well. Esp  scalenes and rhomboids B/L     Assessment & Plan:  Pt is a 70 yr old female with DM type I- A1c 6.7; tinnitus;   HTN has resolved; (was previously dx'd with stiff man's syndrome- doesn't have it). Has Cerebellar atrophy with ataxia and also chronic low back pain s/p radiofrequency ablation 3/22;  Here for ataxia and w/c seating eval.   1. Will write order for new pressure relieving w/c cushion-suggest ROHO hybrid to help with better posture/reduce low back pain.   2. Also will try to get her a new w/c back- hers is not fitting ocrrectly and has no adjustment feature- suggest Comfort- Acta relief cushion so can be adjusted and give more lumbar support.   3. Pt meets criteria for ROHO cushion due to hx of pressure ulcer- Stage II  That was documented- didn't heal well- kept reopening (has DM as well).  Pt also meets criteria for w/c seating changes due to being 3 years out from initial w/c delivery- needs due to some soreness.redness where had previous pressure ulcer-  And overall buttocks. Also  Having constant low back pain, to the point has required radiofrequency ablation for pain and still not resolved.   4.  Doesn't need me to prescribe Zanaflex-  Has PCP writing for it- con't meds.   5. Will have her come back for trigger point injections- will help pain better and reduce risk of needing strong pain meds.   6. Is on Sertraline for FMS Sx's- is helpful.   7. F/U- in 6-8 weeks for trigger point injections. Will also send Rx's for PT eval as well as Cushion and back for w/c to [facility].    I spent a total of 45 minutes seeing pt, calling w/c rep from the room with pt and discussing trigger point injections and overall w/c issues we needed to fix.

## 2020-08-19 ENCOUNTER — Other Ambulatory Visit: Payer: Self-pay

## 2020-08-19 ENCOUNTER — Encounter: Payer: Self-pay | Admitting: Podiatry

## 2020-08-19 ENCOUNTER — Ambulatory Visit (INDEPENDENT_AMBULATORY_CARE_PROVIDER_SITE_OTHER): Payer: Medicare Other | Admitting: Podiatry

## 2020-08-19 DIAGNOSIS — M79675 Pain in left toe(s): Secondary | ICD-10-CM

## 2020-08-19 DIAGNOSIS — M2040 Other hammer toe(s) (acquired), unspecified foot: Secondary | ICD-10-CM

## 2020-08-19 DIAGNOSIS — E108 Type 1 diabetes mellitus with unspecified complications: Secondary | ICD-10-CM | POA: Diagnosis not present

## 2020-08-19 DIAGNOSIS — E1065 Type 1 diabetes mellitus with hyperglycemia: Secondary | ICD-10-CM | POA: Diagnosis not present

## 2020-08-19 DIAGNOSIS — E119 Type 2 diabetes mellitus without complications: Secondary | ICD-10-CM

## 2020-08-19 DIAGNOSIS — M2010 Hallux valgus (acquired), unspecified foot: Secondary | ICD-10-CM

## 2020-08-19 DIAGNOSIS — B351 Tinea unguium: Secondary | ICD-10-CM

## 2020-08-19 DIAGNOSIS — IMO0002 Reserved for concepts with insufficient information to code with codable children: Secondary | ICD-10-CM

## 2020-08-19 DIAGNOSIS — M79674 Pain in right toe(s): Secondary | ICD-10-CM

## 2020-08-19 NOTE — Progress Notes (Signed)
ANNUAL DIABETIC FOOT EXAM  Subjective: Joyce Brown presents today for for annual diabetic foot examination and painful thick toenails that are difficult to trim. Pain interferes with ambulation. Aggravating factors include wearing enclosed shoe gear. Pain is relieved with periodic professional debridement..  She notes no new pedal problems on today's visit.  Patient relates 15 year h/o diabetes.  Patient denies any h/o foot wounds.  Patient denies symptoms of foot numbness.  Patient denies symptoms of foot tingling.  Patient denies symptoms of burning in feet.  Patient denies symptoms of pins/needles in feet.  Patient's blood sugar was 145 mg/dl this morning.   Lind Covert, MD is patient's PCP. Last visit was September, 2021.  Past Medical History:  Diagnosis Date   Asthma    Cerebellar ataxia ([place])    Diabetes mellitus without complication ([place])    Gait abnormality 11/07/2018   Glaucoma    Hyperlipidemia    Hypertension    Pulmonary embolism ([place])    Sleep apnea    Spinal stenosis    Patient Active Problem List   Diagnosis Date Noted   Dyslipidemia 06/19/2020   Spondylosis without myelopathy or radiculopathy, lumbar region 04/27/2020   Bilateral impacted cerumen 08/21/2019   Sensorineural hearing loss (SNHL) of both ears 08/21/2019   Tinnitus, bilateral 06/27/2019   FH: ovarian cancer in first degree relative 11/16/2018   Hypercholesteremia 11/15/2018   Gait abnormality 11/07/2018   H/O bariatric surgery 10/27/2018   Pain of left hip joint 05/18/2018   Chronic midline low back pain with bilateral sciatica 05/18/2018   Depression 03/20/2018   Breast calcifications 12/28/2017   Fibromyalgia 12/06/2017   History of gastric bypass 12/06/2017   Spinal stenosis 12/06/2017   Hypokalemia 12/06/2017   Asthma 12/06/2017   Cerebellar ataxia ([place])    Type I diabetes mellitus with complication, uncontrolled ([place])    Past Surgical History:  Procedure  Laterality Date   ABDOMINAL HYSTERECTOMY     ADENOIDECTOMY     ANKLE SURGERY Left    BILATERAL CARPAL TUNNEL RELEASE     BREAST BIOPSY Left 2019   benign   ELBOW SURGERY     GASTRIC BYPASS  2014   HAND NEUROPLASTY     radio denervation  05/21/2020   TONSILLECTOMY     Current Outpatient Medications on File Prior to Visit  Medication Sig Dispense Refill   albuterol (VENTOLIN HFA) 108 (90 Base) MCG/ACT inhaler Inhale 2 puffs into the lungs every 6 (six) hours as needed.     calcium carbonate (OSCAL) 1500 (600 Ca) MG TABS tablet Take by mouth.     carboxymethylcellulose (REFRESH PLUS) 0.5 % SOLN Apply to eye.     Continuous Blood Gluc Receiver ([place]) [place] Share code: [email or web address]     diazepam (VALIUM) 5 MG tablet take 1 in am , 1 in noon and 2 at bedtime     fluticasone (FLOVENT HFA) 220 MCG/ACT inhaler Inhale into the lungs.     ibuprofen (ADVIL) 200 MG tablet Take by mouth.     insulin detemir (LEVEMIR) 100 UNIT/ML injection 14 units at bedtime     Insulin Syringe-Needle U-100 (BD INSULIN SYRINGE ULTRAFINE) 31G X 5/16" 0.5 ML MISC      Microlet Lancets MISC 3 (three) times daily.     Multiple Vitamins-Minerals (BARIATRIC FUSION PO) [The details of the medication are not available because there are pending changes by a home health clinician.]     mupirocin ointment (BACTROBAN) 2 %  Apply topically.     sodium chloride (OCEAN) 0.65 % nasal spray Place into the nose.     acetaminophen (TYLENOL) 325 MG tablet Take 650 mg by mouth every 6 (six) hours as needed for moderate pain.      atorvastatin (LIPITOR) 40 MG tablet Take 1 tablet (40 mg total) by mouth daily at 6 PM. 90 tablet 3   CALCIUM CITRATE PO Take by mouth.     Cholecalciferol (VITAMIN D3) 75 MCG (3000 UT) TABS Take 75 mcg by mouth daily.      Continuous Blood Gluc Sensor (DEXCOM G6 SENSOR) MISC 1 each by Other route daily.      Continuous Blood Gluc Transmit (DEXCOM G6 TRANSMITTER) MISC 1  each by Misc.(Non-Drug; Combo Route) route. Use as directed for continuous glucose monitoring. Reuse transmitter for 90 days then discard and replace.     glucose blood test strip ASCENSIA CONTOUR NEXT ONE: Use 1 strip to check blood sugar daily 100 each 2   Immune Globulin 10% (PRIVIGEN) 10G/158mL (10,000mg /169mL) SOLN Inject into the vein.     INPEN 100-BLUE-NOVOLOG-FIASP DEVI CONSULT MD FOR APP SETTINGS. 1 each 0   insulin aspart (NOVOLOG PENFILL) cartridge MAX DAILY DOSE OF 60 UNITS per correction scale 60 mL 3   insulin degludec (TRESIBA FLEXTOUCH) 100 UNIT/ML FlexTouch Pen Inject 16 Units into the skin daily. 15 mL 3   Insulin Pen Needle (B-D UF III MINI PEN NEEDLES) 31G X 5 MM MISC Four times a day 400 each 3   Insulin Syringe-Needle U-100 31G X 15/64" 0.5 ML MISC 1 Device by Does not apply route daily. 100 each 6   MICROLET LANCETS MISC Twice a day (Patient taking differently: 1 each by Other route 2 (two) times daily.) 100 each 11   Multiple Vitamin (MULTIVITAMIN) capsule Take 4 capsules by mouth daily. Bariatric fusion      mycophenolate (CELLCEPT) 500 MG tablet Take 2 tablets (1,000 mg total) by mouth 2 (two) times daily. 360 tablet 3   oxybutynin (DITROPAN) 5 MG tablet TAKE ONE TABLET BY MOUTH TWICE A DAY 180 tablet 3   sertraline (ZOLOFT) 50 MG tablet 1-2 by mouth daily 120 tablet 1   tiZANidine (ZANAFLEX) 2 MG tablet TAKE ONE TO TWO TABLETS BY MOUTH AT BEDTIME AS NEEDED FOR MUSCLE SPASMS 60 tablet 4   No current facility-administered medications on file prior to visit.    No Known Allergies Social History   Occupational History   Not on file  Tobacco Use   Smoking status: Never   Smokeless tobacco: Never  Vaping Use   Vaping Use: Never used  Substance and Sexual Activity   Alcohol use: Never   Drug use: Never   Sexual activity: Not on file   Family History  Problem Relation Age of Onset   Cervical cancer Mother    Ovarian cancer Mother    Dementia Father    Retinal  detachment Father    Heart attack Father    Diabetes Father    Stroke Father    Hypertension Father    Glaucoma Paternal Grandfather    Ovarian cancer Sister    Immunization History  Administered Date(s) Administered   Fluad Quad(high Dose 65+) 11/20/2019   Influenza,inj,Quad PF,6+ Mos 12/06/2017, 11/15/2018   PFIZER Comirnaty(Gray Top)Covid-19 Tri-Sucrose Vaccine 04/29/2019, 05/22/2019, 01/23/2020, 07/01/2020   PFIZER(Purple Top)SARS-COV-2 Vaccination 04/29/2019, 05/22/2019, 01/23/2020   Pneumococcal Conjugate-13 03/20/2018   Pneumococcal Polysaccharide-23 07/22/2016   Tdap 11/23/2013, 07/25/2019   Zoster Recombinat (Shingrix)  07/21/2017, 11/04/2017     Review of Systems: Negative except as noted in the HPI.  Objective: There were no vitals filed for this visit.  Joyce Brown is a pleasant 70 y.o. female in NAD. AAO X 3.  Vascular Examination: Capillary refill time to digits immediate b/l. Palpable pedal pulses b/l LE. Pedal hair absent. Lower extremity skin temperature gradient within normal limits. No pain with calf compression b/l. No edema noted b/l lower extremities.  Dermatological Examination: Pedal skin with normal turgor, texture and tone bilaterally. No open wounds bilaterally. No interdigital macerations bilaterally. Toenails 1-5 b/l elongated, discolored, dystrophic, thickened, crumbly with subungual debris and tenderness to dorsal palpation. No hyperkeratotic nor porokeratotic lesions present on today's visit.  Musculoskeletal Examination: Normal muscle strength 5/5 to all lower extremity muscle groups bilaterally. No pain crepitus or joint limitation noted with ROM b/l. Hallux valgus with bunion deformity noted b/l lower extremities. Hammertoe(s) noted to the 2-5 bilaterally. Utilizes wheelchair for mobility assistance.  Footwear Assessment: Does the patient wear appropriate shoes? Yes. Does the patient need inserts/orthotics? No..  Neurological  Examination: Protective sensation intact 5/5 intact bilaterally with 10g monofilament b/l.  Hemoglobin A1C Latest Ref Rng & Units 06/19/2020 02/18/2020 10/19/2019  HGBA1C 4.0 - 5.6 % 6.7(A) 6.9(A) 6.9(A)  Some recent data might be hidden   Assessment: 1. Pain due to onychomycosis of toenails of both feet   2. Hammer toe, unspecified laterality   3. Acquired hallux valgus, unspecified laterality   4. Type I diabetes mellitus with complication, uncontrolled ([place])   5. Encounter for diabetic foot exam ([place])    ADA Risk Categorization: Low Risk :  Patient has all of the following: Intact protective sensation No prior foot ulcer  No severe deformity Pedal pulses present  Plan: -Examined patient. -Diabetic foot exam performed on today's visit. -Continue diabetic foot care principles. -Patient to continue soft, supportive shoe gear daily. -Toenails 1-5 b/l were debrided in length and girth with sterile nail nippers and dremel without iatrogenic bleeding.  -Patient to report any pedal injuries to medical professional immediately. -Patient/POA to call should there be question/concern in the interim.  Return in about 3 months (around 11/19/2020).  Marzetta Board, DPM

## 2020-08-22 ENCOUNTER — Encounter: Payer: Self-pay | Admitting: Internal Medicine

## 2020-09-12 ENCOUNTER — Other Ambulatory Visit: Payer: Self-pay

## 2020-09-12 ENCOUNTER — Encounter: Payer: Self-pay | Admitting: Physical Medicine and Rehabilitation

## 2020-09-12 ENCOUNTER — Encounter
Payer: Medicare Other | Attending: Physical Medicine and Rehabilitation | Admitting: Physical Medicine and Rehabilitation

## 2020-09-12 VITALS — BP 121/67 | HR 73 | Temp 98.6°F | Ht 70.0 in | Wt 185.0 lb

## 2020-09-12 DIAGNOSIS — G119 Hereditary ataxia, unspecified: Secondary | ICD-10-CM | POA: Diagnosis not present

## 2020-09-12 DIAGNOSIS — M797 Fibromyalgia: Secondary | ICD-10-CM | POA: Insufficient documentation

## 2020-09-12 DIAGNOSIS — Z993 Dependence on wheelchair: Secondary | ICD-10-CM | POA: Insufficient documentation

## 2020-09-12 DIAGNOSIS — M7918 Myalgia, other site: Secondary | ICD-10-CM | POA: Diagnosis not present

## 2020-09-12 NOTE — Patient Instructions (Signed)
Plan: [employer] for w/c back and cushion- appt next week.  Patient here for trigger point injections for  Consent done and on chart.  Cleaned areas with alcohol and injected using a 27 gauge 1.5 inch needle  Injected 5 cc Using 1% Lidocaine with no EPI  Upper traps R only Levators R only Posterior scalenes R only Middle scalenes R only Splenius Capitus Pectoralis Major Rhomboids R only x2 Infraspinatus Teres Major/minor Thoracic paraspinals Lumbar paraspinals Other injections- R biceps, triceps, and deltoid as well as R lateral hip  Patient's level of pain prior was 8-9/10 Current level of pain after injections is down to 6/10  There was no bleeding or complications. Patient was advised to drink a lot of water on day after injections to flush system Will have increased soreness for 12-48 hours after injections.  Can use Lidocaine patches the day AFTER injections Can use theracane on day of injections in places didn't inject Can use heating pad 4-6 hours AFTER injections F/U in 3 months for trigger point injections.
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
## 2020-09-18 DIAGNOSIS — R262 Difficulty in walking, not elsewhere classified: Secondary | ICD-10-CM | POA: Diagnosis not present

## 2020-09-18 DIAGNOSIS — M4800 Spinal stenosis, site unspecified: Secondary | ICD-10-CM | POA: Diagnosis not present

## 2020-09-18 DIAGNOSIS — L89311 Pressure ulcer of right buttock, stage 1: Secondary | ICD-10-CM | POA: Diagnosis not present

## 2020-09-18 DIAGNOSIS — L89322 Pressure ulcer of left buttock, stage 2: Secondary | ICD-10-CM | POA: Diagnosis not present

## 2020-09-18 DIAGNOSIS — G3281 Cerebellar ataxia in diseases classified elsewhere: Secondary | ICD-10-CM | POA: Diagnosis not present

## 2020-09-25 ENCOUNTER — Other Ambulatory Visit: Payer: Self-pay | Admitting: Family Medicine

## 2020-09-27 ENCOUNTER — Other Ambulatory Visit: Payer: Self-pay | Admitting: Family Medicine

## 2020-09-29 ENCOUNTER — Ambulatory Visit
Admission: RE | Admit: 2020-09-29 | Discharge: 2020-09-29 | Disposition: A | Discharge status: Home / Self Care | Payer: Medicare Other | Source: Ambulatory Visit | Attending: Emergency Medicine | Admitting: Emergency Medicine

## 2020-09-29 ENCOUNTER — Other Ambulatory Visit: Payer: Self-pay

## 2020-09-29 DIAGNOSIS — Z1231 Encounter for screening mammogram for malignant neoplasm of breast: Secondary | ICD-10-CM

## 2020-09-30 ENCOUNTER — Encounter: Payer: Self-pay | Admitting: Internal Medicine

## 2020-10-05 ENCOUNTER — Emergency Department (HOSPITAL_COMMUNITY): Payer: Medicare Other

## 2020-10-05 ENCOUNTER — Encounter (HOSPITAL_COMMUNITY): Payer: Self-pay | Admitting: Emergency Medicine

## 2020-10-05 ENCOUNTER — Emergency Department (HOSPITAL_COMMUNITY)
Admission: EM | Admit: 2020-10-05 | Discharge: 2020-10-05 | Disposition: A | Discharge status: Home / Self Care | Payer: Medicare Other | Attending: Emergency Medicine | Admitting: Emergency Medicine

## 2020-10-05 DIAGNOSIS — E785 Hyperlipidemia, unspecified: Secondary | ICD-10-CM | POA: Insufficient documentation

## 2020-10-05 DIAGNOSIS — Z7951 Long term (current) use of inhaled steroids: Secondary | ICD-10-CM | POA: Insufficient documentation

## 2020-10-05 DIAGNOSIS — E1069 Type 1 diabetes mellitus with other specified complication: Secondary | ICD-10-CM | POA: Diagnosis not present

## 2020-10-05 DIAGNOSIS — Z79899 Other long term (current) drug therapy: Secondary | ICD-10-CM | POA: Insufficient documentation

## 2020-10-05 DIAGNOSIS — Z743 Need for continuous supervision: Secondary | ICD-10-CM | POA: Diagnosis not present

## 2020-10-05 DIAGNOSIS — J45909 Unspecified asthma, uncomplicated: Secondary | ICD-10-CM | POA: Insufficient documentation

## 2020-10-05 DIAGNOSIS — I10 Essential (primary) hypertension: Secondary | ICD-10-CM | POA: Insufficient documentation

## 2020-10-05 DIAGNOSIS — S92351A Displaced fracture of fifth metatarsal bone, right foot, initial encounter for closed fracture: Secondary | ICD-10-CM | POA: Insufficient documentation

## 2020-10-05 DIAGNOSIS — S99921A Unspecified injury of right foot, initial encounter: Secondary | ICD-10-CM | POA: Diagnosis not present

## 2020-10-05 DIAGNOSIS — R55 Syncope and collapse: Secondary | ICD-10-CM | POA: Insufficient documentation

## 2020-10-05 DIAGNOSIS — S92353A Displaced fracture of fifth metatarsal bone, unspecified foot, initial encounter for closed fracture: Secondary | ICD-10-CM | POA: Insufficient documentation

## 2020-10-05 DIAGNOSIS — Z794 Long term (current) use of insulin: Secondary | ICD-10-CM | POA: Diagnosis not present

## 2020-10-05 DIAGNOSIS — W1839XA Other fall on same level, initial encounter: Secondary | ICD-10-CM | POA: Insufficient documentation

## 2020-10-05 DIAGNOSIS — M25571 Pain in right ankle and joints of right foot: Secondary | ICD-10-CM | POA: Diagnosis not present

## 2020-10-05 DIAGNOSIS — Z043 Encounter for examination and observation following other accident: Secondary | ICD-10-CM | POA: Diagnosis not present

## 2020-10-05 DIAGNOSIS — R609 Edema, unspecified: Secondary | ICD-10-CM | POA: Diagnosis not present

## 2020-10-05 LAB — COMPREHENSIVE METABOLIC PANEL
ALT: 24 U/L (ref 0–44)
AST: 27 U/L (ref 15–41)
Albumin: 3.6 g/dL (ref 3.5–5.0)
Alkaline Phosphatase: 56 U/L (ref 38–126)
Anion gap: 5 (ref 5–15)
BUN: 17 mg/dL (ref 8–23)
CO2: 27 mmol/L (ref 22–32)
Calcium: 9.6 mg/dL (ref 8.9–10.3)
Chloride: 107 mmol/L (ref 98–111)
Creatinine, Ser: 0.89 mg/dL (ref 0.44–1.00)
GFR, Estimated: >60 mL/min (ref >60)
Glucose, Bld: 99 mg/dL (ref 70–99)
Potassium: 5.4 mmol/L — ABNORMAL HIGH (ref 3.5–5.1)
Sodium: 139 mmol/L (ref 135–145)
Total Bilirubin: 0.4 mg/dL (ref 0.3–1.2)
Total Protein: 6.1 g/dL — ABNORMAL LOW (ref 6.5–8.1)

## 2020-10-05 LAB — CBC WITH DIFFERENTIAL/PLATELET
Abs Immature Granulocytes: 0.01 10*3/uL (ref 0.00–0.07)
Basophils Absolute: 0.1 10*3/uL (ref 0.0–0.1)
Basophils Relative: 1 %
Eosinophils Absolute: 0.1 10*3/uL (ref 0.0–0.5)
Eosinophils Relative: 1 %
HCT: 41 % (ref 36.0–46.0)
Hemoglobin: 13 g/dL (ref 12.0–15.0)
Immature Granulocytes: 0 %
Lymphocytes Relative: 21 %
Lymphs Abs: 1.1 10*3/uL (ref 0.7–4.0)
MCH: 30.5 pg (ref 26.0–34.0)
MCHC: 31.7 g/dL (ref 30.0–36.0)
MCV: 96.2 fL (ref 80.0–100.0)
Monocytes Absolute: 0.4 10*3/uL (ref 0.1–1.0)
Monocytes Relative: 8 %
Neutro Abs: 3.6 10*3/uL (ref 1.7–7.7)
Neutrophils Relative %: 69 %
Platelets: 285 10*3/uL (ref 150–400)
RBC: 4.26 MIL/uL (ref 3.87–5.11)
RDW: 13.2 % (ref 11.5–15.5)
WBC: 5.2 10*3/uL (ref 4.0–10.5)
nRBC: 0 % (ref 0.0–0.2)

## 2020-10-05 LAB — CBG MONITORING, ED: Glucose-Capillary: 96 mg/dL (ref 70–99)

## 2020-10-05 LAB — TROPONIN I (HIGH SENSITIVITY): Troponin I (High Sensitivity): 10 ng/L (ref <18)

## 2020-10-05 MED ORDER — ACETAMINOPHEN 325 MG PO TABS
650.0000 mg | ORAL_TABLET | Freq: Once | ORAL | Status: DC
  Filled 2020-10-05: qty 2

## 2020-10-05 MED ORDER — ACETAMINOPHEN 325 MG PO TABS
650.0000 mg | ORAL_TABLET | Freq: Once | ORAL | Status: AC
  Administered 2020-10-05: 650 mg via ORAL
  Filled 2020-10-05: qty 2

## 2020-10-05 NOTE — ED Provider Notes (Signed)
Emergency Medicine Provider Triage Evaluation Note  Joyce Brown , a 70 y.o. female  was evaluated in triage.  Pt complains of right foot pain.  Patient states 5 days ago she was in her wheelchair when she had a syncopal event.  Does not know what happened, lost about 3 hours.  When she woke up, she had pain of her right foot.  She has been tried to treat at home without improvement.  She uses a Conservation officer, historic buildings at home due to cerebellar ataxia.  She is not on blood thinners.  No headache.  She does have a bruise on her left chest wall, tenderness in this area.   Review of Systems  Positive: Foot pain, chest wall pain Negative: Ha, neck pain, back pain  Physical Exam  There were no vitals taken for this visit. Gen:   Awake, no distress   Resp:  Normal effort  MSK:   Swelling and bruising of the right foot, at the base of the toes.  Pedal pulse intact.  Good distal sensation and cap refill.  Contusion over the left breast, tenderness in this area.  Clear lung sounds.   Medical Decision Making  Medically screening exam initiated at 10:37 AM.  Appropriate orders placed.  Joyce Brown was informed that the remainder of the evaluation will be completed by another provider, this initial triage assessment does not replace that evaluation, and the importance of remaining in the ED until their evaluation is complete.  Labs, Kathrin Ruddy, PA-C 10/05/20 1049    Lennice Sites, DO 10/05/20 1233

## 2020-10-05 NOTE — ED Notes (Signed)
Pt discharged at this time; awaiting PTAR.

## 2020-10-05 NOTE — ED Notes (Signed)
Called ortho tech to apply boot.

## 2020-10-05 NOTE — ED Notes (Signed)
Ordered tylenol given in triage but not documented
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
## 2020-10-05 NOTE — ED Provider Notes (Signed)
[facility] EMERGENCY DEPARTMENT Provider Note   CSN: [id number] Arrival date & time: 10/05/20  1029     History Chief Complaint  Patient presents with   Foot Pain   Loss of Consciousness    Joyce Brown is a 70 y.o. female.  Pt presents to the ED today with right foot pain.  Pt said she had a syncopal event 5 days ago.  She hit her right foot on something when she fell.  She has been trying to manage at home, but continues to have bruising and swelling and pain.  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  She uses a walker to transfer.  She lives alone.  She thinks she had the syncopal event due to hypoglycemia.  She has been fine since then other than the foot.  She is not on blood thinners.      Past Medical History:  Diagnosis Date   Asthma    Cerebellar ataxia ([place])    Diabetes mellitus without complication ([place])    Gait abnormality 11/07/2018   Glaucoma    Hyperlipidemia    Hypertension    Pulmonary embolism ([place])    Sleep apnea    Spinal stenosis     Patient Active Problem List   Diagnosis Date Noted   Myofascial pain 09/12/2020   Wheelchair dependence 09/12/2020   Dyslipidemia 06/19/2020   Spondylosis without myelopathy or radiculopathy, lumbar region 04/27/2020   Bilateral impacted cerumen 08/21/2019   Sensorineural hearing loss (SNHL) of both ears 08/21/2019   Tinnitus, bilateral 06/27/2019   FH: ovarian cancer in first degree relative 11/16/2018   Hypercholesteremia 11/15/2018   Gait abnormality 11/07/2018   H/O bariatric surgery 10/27/2018   Pain of left hip joint 05/18/2018   Chronic midline low back pain with bilateral sciatica 05/18/2018   Depression 03/20/2018   Breast calcifications 12/28/2017   Fibromyalgia 12/06/2017   History of gastric bypass 12/06/2017   Spinal stenosis 12/06/2017   Hypokalemia 12/06/2017   Asthma 12/06/2017   Cerebellar ataxia ([place])    Type I diabetes mellitus with complication,  uncontrolled ([place])     Past Surgical History:  Procedure Laterality Date   ABDOMINAL HYSTERECTOMY     ADENOIDECTOMY     ANKLE SURGERY Left    BILATERAL CARPAL TUNNEL RELEASE     BREAST BIOPSY Left 2019   benign   ELBOW SURGERY     GASTRIC BYPASS  2014   HAND NEUROPLASTY     radio denervation  05/21/2020   TONSILLECTOMY       OB History   No obstetric history on file.     Family History  Problem Relation Age of Onset   Cervical cancer Mother    Ovarian cancer Mother    Dementia Father    Retinal detachment Father    Heart attack Father    Diabetes Father    Stroke Father    Hypertension Father    Glaucoma Paternal Grandfather    Ovarian cancer Sister     Social History   Tobacco Use   Smoking status: Never   Smokeless tobacco: Never  Vaping Use   Vaping Use: Never used  Substance Use Topics   Alcohol use: Never   Drug use: Never    Home Medications Prior to Admission medications   Medication Sig Start Date End Date Taking? Authorizing Provider  acetaminophen (TYLENOL) 325 MG tablet Take 650 mg by mouth every 6 (six) hours as needed for moderate  pain.     [provider]  albuterol (VENTOLIN HFA) 108 (90 Base) MCG/ACT inhaler Inhale 2 puffs into the lungs every 6 (six) hours as needed. 09/08/17   [provider]  atorvastatin (LIPITOR) 40 MG tablet TAKE ONE TABLET BY MOUTH DAILY AT 6PM 09/25/20   Lind Covert, MD  calcium carbonate (OSCAL) 1500 (600 Ca) MG TABS tablet Take by mouth. 01/18/17   [provider]  CALCIUM CITRATE PO Take by mouth.    [provider]  carboxymethylcellulose (REFRESH PLUS) 0.5 % SOLN Apply to eye. 08/22/17   [provider]  Cholecalciferol (VITAMIN D3) 75 MCG (3000 UT) TABS Take 75 mcg by mouth daily.     [provider]  Continuous Blood Gluc Receiver ([place]) [place] Share code: [email or web address] 10/22/16   [provider]   Continuous Blood Gluc Sensor (DEXCOM G6 SENSOR) MISC 1 each by Other route daily.     [provider]  Continuous Blood Gluc Transmit (DEXCOM G6 TRANSMITTER) MISC 1 each by Misc.(Non-Drug; Combo Route) route. Use as directed for continuous glucose monitoring. Reuse transmitter for 90 days then discard and replace.    [provider]  diazepam (VALIUM) 5 MG tablet take 1 in am , 1 in noon and 2 at bedtime 11/23/17   [provider]  fluticasone (FLOVENT HFA) 220 MCG/ACT inhaler Inhale into the lungs. 09/08/17   [provider]  glucose blood test strip ASCENSIA CONTOUR NEXT ONE: Use 1 strip to check blood sugar daily 04/09/20   Shamleffer, Melanie Crazier, MD  ibuprofen (ADVIL) 200 MG tablet Take by mouth. 08/22/17   [provider]  Immune Globulin 10% (PRIVIGEN) 10G/15m (10,'000mg'$ /1056m SOLN Inject into the vein.    [provider]  INPEN 100-BLUE-NOVOLOG-FIASP DEVI CONSULT MD FOR APP SETTINGS. 07/25/20   Shamleffer, IbMelanie CrazierMD  insulin aspart (NOVOLOG PENFILL) cartridge MAX DAILY DOSE OF 60 UNITS per correction scale 06/19/20   Shamleffer, IbMelanie CrazierMD  insulin degludec (TRESIBA FLEXTOUCH) 100 UNIT/ML FlexTouch Pen Inject 16 Units into the skin daily. 06/19/20   Shamleffer, IbMelanie CrazierMD  insulin detemir (LEVEMIR) 100 UNIT/ML injection 14 units at bedtime 11/15/17   [provider]  Insulin Pen Needle (B-D UF III MINI PEN NEEDLES) 31G X 5 MM MISC Four times a day 10/19/19   Shamleffer, IbMelanie CrazierMD  Insulin Syringe-Needle U-100 31G X 15/64" 0.5 ML MISC 1 Device by Does not apply route daily. 04/19/19   Shamleffer, IbMelanie CrazierMD  Insulin Syringe-Needle U-100 31G X 5/16" 0.5 ML MISC  03/27/20   [provider]  MAGNESIUM GLYCINATE PO Take 200 mg by mouth.    [provider]  MICROLET LANCETS MISC Twice a day Patient taking differently: 1 each by Other route 2 (two) times daily. 02/07/18    Shamleffer, IbMelanie CrazierMD  Microlet Lancets MISC 3 (three) times daily. 08/26/17   [provider]  Multiple Vitamin (MULTIVITAMIN) capsule Take 4 capsules by mouth daily. Bariatric fusion     [provider]  Multiple Vitamins-Minerals (BARIATRIC FUSION PO) [The details of the medication are not available because there are pending changes by a home health clinician.] 03/26/13   [provider]  mupirocin ointment (BACTROBAN) 2 % Apply topically. 08/19/14   [provider]  mycophenolate (CELLCEPT) 500 MG tablet Take 2 tablets (1,000 mg total) by mouth 2 (two) times daily. 05/26/20   WiKathrynn DuckingMD  oxybutynin ([facility]  MG tablet TAKE ONE TABLET BY MOUTH TWICE A DAY 06/30/20   Lind Covert, MD  sertraline (ZOLOFT) 50 MG tablet 1-2 by mouth daily 03/24/20   Lind Covert, MD  sodium chloride (OCEAN) 0.65 % nasal spray Place into the nose. 08/22/17   [provider]  tiZANidine (ZANAFLEX) 2 MG tablet TAKE 1 TO 2 TABLETS BY MOUTH AT BEDTIME AS NEEDED FOR MUSCLE SPASMS 09/29/20   Lind Covert, MD    Allergies    Patient has no known allergies.  Review of Systems   Review of Systems  Musculoskeletal:        Right foot pain  Neurological:  Positive for syncope.  All other systems reviewed and are negative.  Physical Exam Updated Vital Signs BP (!) 156/72   Pulse 64   Temp 98.8 F (37.1 C) (Oral)   Resp 17   SpO2 100%   Physical Exam Vitals and nursing note reviewed.  Constitutional:      Appearance: Normal appearance.  HENT:     Head: Normocephalic and atraumatic.     Right Ear: External ear normal.     Left Ear: External ear normal.     Nose: Nose normal.     Mouth/Throat:     Mouth: Mucous membranes are moist.     Pharynx: Oropharynx is clear.  Eyes:     Extraocular Movements: Extraocular movements intact.     Conjunctiva/sclera: Conjunctivae normal.     Pupils: Pupils are equal, round, and  reactive to light.  Cardiovascular:     Rate and Rhythm: Normal rate and regular rhythm.     Pulses: Normal pulses.     Heart sounds: Normal heart sounds.  Pulmonary:     Effort: Pulmonary effort is normal.     Breath sounds: Normal breath sounds.  Abdominal:     General: Abdomen is flat. Bowel sounds are normal.     Palpations: Abdomen is soft.  Musculoskeletal:     Cervical back: Normal range of motion and neck supple.     Right foot: Decreased range of motion. Swelling and tenderness present.  Skin:    General: Skin is warm.     Capillary Refill: Capillary refill takes less than 2 seconds.  Neurological:     Mental Status: She is alert and oriented to person, place, and time.     Gait: Gait abnormal.     Comments: Ataxia (chronic)  Psychiatric:        Mood and Affect: Mood normal.        Behavior: Behavior normal.    ED Results / Procedures / Treatments   Labs (all labs ordered are listed, but only abnormal results are displayed) Labs Reviewed  COMPREHENSIVE METABOLIC PANEL - Abnormal; Notable for the following components:      Result Value   Potassium 5.4 (*)    Total Protein 6.1 (*)    All other components within normal limits  CBC WITH DIFFERENTIAL/PLATELET  URINALYSIS, ROUTINE W REFLEX MICROSCOPIC  CBG MONITORING, ED  TROPONIN I (HIGH SENSITIVITY)    EKG EKG Interpretation  Date/Time:  Sunday October 05 2020 10:35:21 EDT Ventricular Rate:  87 PR Interval:  172 QRS Duration: 74 QT Interval:  336 QTC Calculation: 404 R Axis:   86 Text Interpretation: Normal sinus rhythm Anteroseptal infarct , age undetermined Abnormal ECG No significant change since last tracing Confirmed by Isla Pence [phone]) on 10/05/2020 3:00:33 PM  Radiology DG Chest 2 View  Result Date: 10/05/2020  CLINICAL DATA:  Fall 5 days ago. EXAM: CHEST - 2 VIEW COMPARISON:  Chest radiograph dated 12/01/2017. FINDINGS: The heart size and mediastinal contours are within normal limits. The lungs  are mildly hyperinflated. Both lungs are clear. The visualized skeletal structures are unremarkable. IMPRESSION: No active cardiopulmonary disease. Electronically Signed   By: Zerita Boers M.D.   On: 10/05/2020 11:28   DG Foot Complete Right  Result Date: 10/05/2020 CLINICAL DATA:  Fall 5 days ago with right foot pain. EXAM: RIGHT FOOT COMPLETE - 3+ VIEW COMPARISON:  None. FINDINGS: There is an acute, mildly displaced oblique fracture of the midshaft of the fifth metatarsal. This does not definitely extend to the joint space. There is no acute dislocation. There is diffuse osseous demineralization. Soft tissues swelling of the foot. IMPRESSION: Acute fracture of the fifth metatarsal. Electronically Signed   By: Zerita Boers M.D.   On: 10/05/2020 11:27    Procedures Procedures   Medications Ordered in ED Medications  acetaminophen (TYLENOL) tablet 650 mg (650 mg Oral Given 10/05/20 1427)    ED Course  I have reviewed the triage vital signs and the nursing notes.  Pertinent labs & imaging results that were available during my care of the patient were reviewed by me and considered in my medical decision making (see chart for details).    MDM Rules/Calculators/A&P                           Pt's syncopal event occurred 5 days ago.  She has been fine since then.  She is stable for d/c.  Return if worse.  F/u with podiatry.  Final Clinical Impression(s) / ED Diagnoses Final diagnoses:  Closed displaced fracture of fifth metatarsal bone of right foot, initial encounter    Rx / DC Orders ED Discharge Orders     None        Isla Pence, MD 10/05/20 [phone]

## 2020-10-05 NOTE — Progress Notes (Signed)
Orthopedic Tech Progress Note Patient Details:  Joyce Brown 10/21/1950 [id number]  Ortho Devices Type of Ortho Device: CAM walker Ortho Device/Splint Location: RLE Ortho Device/Splint Interventions: Ordered, Application, Adjustment   Post Interventions Patient Tolerated: Well Instructions Provided: Care of device  Janit Pagan 10/05/2020, 3:36 PM

## 2020-10-08 ENCOUNTER — Ambulatory Visit (INDEPENDENT_AMBULATORY_CARE_PROVIDER_SITE_OTHER): Payer: Medicare Other | Admitting: Podiatry

## 2020-10-08 ENCOUNTER — Other Ambulatory Visit: Payer: Self-pay

## 2020-10-08 DIAGNOSIS — S92352A Displaced fracture of fifth metatarsal bone, left foot, initial encounter for closed fracture: Secondary | ICD-10-CM

## 2020-10-08 NOTE — Progress Notes (Signed)
   HPI: 70 y.o. female presenting today for new complaint regarding a fracture that the patient sustained to the right foot.  Patient states that on 09/30/2020 she sustained a syncopal event and hit her right foot when she fell.  She finally presented to the emergency department on 10/05/2020 where x-rays were taken and she was diagnosed with a minimally displaced fracture of the right fifth metatarsal and referred here for follow-up.  She normally uses a wheelchair with a walker occasionally.  Patient lives alone.  She presents for further treatment and evaluation  Past Medical History:  Diagnosis Date   Asthma    Cerebellar ataxia ([place])    Diabetes mellitus without complication ([place])    Gait abnormality 11/07/2018   Glaucoma    Hyperlipidemia    Hypertension    Pulmonary embolism ([facility])    Sleep apnea    Spinal stenosis      Physical Exam: General: The patient is alert and oriented x3 in no acute distress.  Dermatology: Skin is warm, dry and supple bilateral lower extremities. Negative for open lesions or macerations.  Vascular: Palpable pedal pulses bilaterally.  No erythema.  Moderate edema around the lateral aspect of the right foot with some ecchymosis. Capillary refill within normal limits.  Neurological: Epicritic and protective threshold grossly intact bilaterally.   Musculoskeletal Exam: Associated tenderness to the lateral aspect of the right foot with moderate edema and swelling  Radiographic Exam RT foot 10/05/2020: FINDINGS: There is an acute, mildly displaced oblique fracture of the midshaft of the fifth metatarsal. This does not definitely extend to the joint space. There is no acute dislocation. There is diffuse osseous demineralization. Soft tissues swelling of the foot.   IMPRESSION: Acute fracture of the fifth metatarsal.  Assessment: 1.  Mildly displaced oblique fracture right fifth metatarsal, initial encounter   Plan of Care:  1. Patient evaluated. X-Rays  reviewed that were taken in the emergency department on 10/06/2018.  2.  Explained to the patient that I believe this fracture will heal on its own without any surgical intervention.  Overall there is good alignment of the fifth metatarsal and only very mild displacement. 3.  Continue nonweightbearing in the cam boot.  Patient is mostly wheelchair-bound.  She may put pressure on the foot with transition to prevent falls 4.  Return to clinic 4 weeks for follow-up x-ray      Edrick Kins, DPM [facility]  Dr. Edrick Kins, DPM    [address], [place] [address]                Office [phone]  Fax [phone]

## 2020-10-09 ENCOUNTER — Encounter: Payer: Self-pay | Admitting: Family Medicine

## 2020-10-13 ENCOUNTER — Telehealth: Payer: Self-pay | Admitting: *Deleted

## 2020-10-13 NOTE — Telephone Encounter (Signed)
Mrs Vroman called to give Dr Dagoberto Ligas a heads up that she will be getting a request to sign from [employer] for a foot rest for her wheelchair.

## 2020-10-15 ENCOUNTER — Other Ambulatory Visit: Payer: Self-pay | Admitting: Internal Medicine

## 2020-10-16 DIAGNOSIS — Z20822 Contact with and (suspected) exposure to covid-19: Secondary | ICD-10-CM | POA: Diagnosis not present

## 2020-10-30 ENCOUNTER — Encounter: Payer: Self-pay | Admitting: Internal Medicine

## 2020-11-05 ENCOUNTER — Ambulatory Visit (INDEPENDENT_AMBULATORY_CARE_PROVIDER_SITE_OTHER): Payer: Medicare Other

## 2020-11-05 ENCOUNTER — Ambulatory Visit (INDEPENDENT_AMBULATORY_CARE_PROVIDER_SITE_OTHER): Payer: Medicare Other | Admitting: Podiatry

## 2020-11-05 ENCOUNTER — Other Ambulatory Visit: Payer: Self-pay

## 2020-11-05 ENCOUNTER — Other Ambulatory Visit: Payer: Self-pay | Admitting: Podiatry

## 2020-11-05 DIAGNOSIS — S92352A Displaced fracture of fifth metatarsal bone, left foot, initial encounter for closed fracture: Secondary | ICD-10-CM

## 2020-11-05 DIAGNOSIS — S92352D Displaced fracture of fifth metatarsal bone, left foot, subsequent encounter for fracture with routine healing: Secondary | ICD-10-CM

## 2020-11-05 DIAGNOSIS — S92351D Displaced fracture of fifth metatarsal bone, right foot, subsequent encounter for fracture with routine healing: Secondary | ICD-10-CM

## 2020-11-05 NOTE — Progress Notes (Signed)
   HPI: 70 y.o. female presenting today for follow-up evaluation of a fracture that the patient sustained to the right foot.  Patient states that on 09/30/2020 she sustained a syncopal event and hit her right foot when she fell.  She finally presented to the emergency department on 10/05/2020 where x-rays were taken and she was diagnosed with a minimally displaced fracture of the right fifth metatarsal and referred here for follow-up.    Since last visit the patient has been essentially nonweightbearing in the cam boot except for transition purposes from her wheelchair.  Patient is mostly wheelchair-bound.  No new complaints at this time.  She says the pain has improved significantly.  Past Medical History:  Diagnosis Date   Asthma    Cerebellar ataxia ([place])    Diabetes mellitus without complication ([place])    Gait abnormality 11/07/2018   Glaucoma    Hyperlipidemia    Hypertension    Pulmonary embolism ([facility])    Sleep apnea    Spinal stenosis      Physical Exam: General: The patient is alert and oriented x3 in no acute distress.  Dermatology: Skin is warm, dry and supple bilateral lower extremities. Negative for open lesions or macerations.  Vascular: Palpable pedal pulses bilaterally.  No erythema.  Minimal edema around the lateral aspect of the right foot with some ecchymosis. Capillary refill immediate  Neurological: Epicritic and protective threshold grossly intact bilaterally.   Musculoskeletal Exam: Very mild and improved tenderness to the lateral aspect of the right forefoot overlying the fifth metatarsal.  Otherwise no pedal deformities  Radiographic Exam RT foot 10/05/2020: FINDINGS: There is an acute, mildly displaced oblique fracture of the midshaft of the fifth metatarsal. This does not definitely extend to the joint space. There is no acute dislocation. There is diffuse osseous demineralization. Soft tissues swelling of the foot.   IMPRESSION: Acute fracture of the fifth  metatarsal.  Radiographic exam RT foot today: Good demonstration of healing of the fifth metatarsal fracture.  Good alignment.  Callus formation noted with routine healing.  Assessment: 1.  Mildly displaced oblique fracture right fifth metatarsal, initial encounter   Plan of Care:  1. Patient evaluated.  X-rays reviewed today and demonstrate good healing of the fifth metatarsal bone  2.  The patient is minimally ambulatory.  She may now discontinue the cam boot.  Resume her good supportive diabetic shoes and insoles 3.  Return to clinic in 6 weeks for final follow-up x-ray     Edrick Kins, DPM [facility]  Dr. Edrick Kins, DPM    [address], [place] [address]                Office [phone]  Fax [phone]

## 2020-11-09 ENCOUNTER — Encounter: Payer: Self-pay | Admitting: Podiatry

## 2020-11-20 ENCOUNTER — Other Ambulatory Visit: Payer: Self-pay | Admitting: Family Medicine

## 2020-11-21 ENCOUNTER — Encounter: Payer: Medicare Other | Attending: Internal Medicine | Admitting: Dietician

## 2020-11-21 ENCOUNTER — Other Ambulatory Visit: Payer: Self-pay

## 2020-11-21 DIAGNOSIS — E108 Type 1 diabetes mellitus with unspecified complications: Secondary | ICD-10-CM | POA: Diagnosis not present

## 2020-11-21 DIAGNOSIS — IMO0002 Reserved for concepts with insufficient information to code with codable children: Secondary | ICD-10-CM

## 2020-11-21 DIAGNOSIS — E1065 Type 1 diabetes mellitus with hyperglycemia: Secondary | ICD-10-CM | POA: Insufficient documentation

## 2020-11-21 NOTE — Patient Instructions (Addendum)
Simple meal planning - aim to have a protein with each meal  Sheet pan meal (chicken, vegetables, potatoes)   Bean soup with salad  Salad with boiled egg or cottage cheese, fruit  Pasta salad, cottage cheese or cheese or boiled egg, fruit  Tuna sandwich on spinach, German dark wheat bread, baby carrots Increase your vegetable intake  Find a way to get exercise back.   Get in the [facility] as you are able.   Continue to wheel around your neighborhood. You tube chair exercises:  Aim for 15-20 minutes 3 times per week. Physical therapists that do seated exercise programs   Sit and Be Fit  Resources: http://schroeder.biz/ (meal planning and recipes) DiabetesSelfManagement.com (recipes) http://cook-fox.com/ (free virtual conference 11/22/2020) http://www.fernandez-meyer.com/

## 2020-11-21 NOTE — Progress Notes (Signed)
Diabetes Self-Management Education  Visit Type: Follow-up  Appt. Start Time: 0910 Appt. End Time: [id number]  11/21/2020  [name], identified by name and date of birth, is a 70 y.o. female with a diagnosis of Diabetes:  .   ASSESSMENT Patient is here today alone. She has now moved to a first floor apartment.  (Elevator kept braking and she was on the 3rd floor.) Off track with exercise as she broke her foot in mid-July.  Out of the boot for 2 weeks but still numb. She was using the pool at her apartment complex. In the winter she was using the [place] but transportation makes it difficult as she relies on [employer].  Dexcom and InPen report reviewed.   Time in range is 48%.  She trends high but not as much very high currently Average glucose 179 Best day in the past 2 weeks was  11/20/2020 when she wheeled to the grocery store (78% time in range).  She reports taking her medication consistently but InPen report does not always indicate this when she takes it out of the programed range.  When she has an urgent low coming, she eats peanut butter crackers which helps but the glucose tabs no longer make her blood glucose rise.  Weight increased 6 lbs (lack of activity and meal planning)  She has not cooked as much due to her foot.  She has also lost interest in cooking. 11/20/2020 food report had no vegetables and 2 servings of fruit, no meat, increased cheese, increased processed simple foods. Appetite is fair. She verbalized that she needs to get the crock pot out.  Food prices effecting quality of her diet.  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   Type 1 Diabetes since 2007, asthma, cerebellar ataxia, HTN, HLD, OSA on C-pap and sleep study Julyl 6. Noted swallow evaluation with recommendation of a Dysphagia 3 diet. Dexcom G-6 since 07/11/18.  Inpen  Medication includes Tresiba 16 units q HS and Novolog 8 units before breakfast,13  units before lunch and 9 units before dinner and 1 unit before a snack.   She is not aiming to give this 30 minutes prior to the meal which has improved her blood sugar control.   185 lbs per patient 11/21/2020   Wt Readings from Last 3 Encounters:  05/15/20 173 lb (78.5 kg)  02/18/20 178 lb 4 oz (80.9 kg)  11/20/19 179 lb 9.6 oz (81.5 kg)      Patient lives alone.  She uses a wheel chair.  She is doing her own grocery shopping.  She uses [employer] transpiration for medical appointments but is able to wheel herself to the grocery store.  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 She moved here from [place] last year to be closer to one of her sister's who lives in [place]. The pool at her apartment is to reopen outdoors.  Patient enjoys walking in the pool.   She last saw an RD in [place] about 11 months prior to my first visit    Diabetes Self-Management Education - 11/21/20 1703       Visit Information   Visit Type Follow-up      Pre-Education Assessment   Patient understands the diabetes disease and treatment process. Demonstrates understanding / competency    Patient understands incorporating nutritional management into lifestyle. Needs Review    Patient undertands incorporating physical activity into lifestyle. Needs Review  Patient understands using medications safely. Demonstrates understanding / competency    Patient understands monitoring blood glucose, interpreting and using results Demonstrates understanding / competency    Patient understands prevention, detection, and treatment of acute complications. Demonstrates understanding / competency    Patient understands prevention, detection, and treatment of chronic complications. Demonstrates understanding / competency    Patient understands how to develop strategies to address psychosocial issues. Demonstrates understanding / competency    Patient understands how to develop  strategies to promote health/change behavior. Needs Review      Complications   Last HgB A1C per patient/outside source 6.7 %   06/19/2020   How often do you check your blood sugar? > 4 times/day    Fasting Blood glucose range (mg/dL) 70-129;130-179;180-200;>200    Postprandial Blood glucose range (mg/dL) 130-179;180-200;>200      Dietary Intake   Breakfast [place] granola bar, coffee with half and half and sugar free syrup    Lunch german dark wheat bread, olives, peach, cheeese, yogurt    Dinner berries, belvia, olives, cheese    Beverage(s) water, coffee, occasional OJ when glucose trending down      Exercise   Exercise Type ADL's      Patient Education   Previous Diabetes Education Yes (please comment)   05/2020   Nutrition management  Meal options for control of blood glucose level and chronic complications.    Physical activity and exercise  Helped patient identify appropriate exercises in relation to his/her diabetes, diabetes complications and other health issue.    Medications Reviewed patients medication for diabetes, action, purpose, timing of dose and side effects.    Monitoring Taught/evaluated SMBG meter.    Personal strategies to promote health Lifestyle issues that need to be addressed for better diabetes care      Individualized Goals (developed by patient)   Nutrition General guidelines for healthy choices and portions discussed    Physical Activity Exercise 5-7 days per week;30 minutes per day    Medications take my medication as prescribed    Monitoring  test my blood glucose as discussed    Reducing Risk examine blood glucose patterns;Other (comment)   meal plan     Patient Self-Evaluation of Goals - Patient rates self as meeting previously set goals (% of time)   Nutrition 50 - 75 %    Physical Activity 25 - 50%    Medications >75%    Monitoring >75%    Problem Solving >75%    Reducing Risk 50 - 75 %    Health Coping >75%      Post-Education  Assessment   Patient understands the diabetes disease and treatment process. Demonstrates understanding / competency    Patient understands incorporating nutritional management into lifestyle. Needs Review    Patient undertands incorporating physical activity into lifestyle. Demonstrates understanding / competency    Patient understands using medications safely. Demonstrates understanding / competency    Patient understands monitoring blood glucose, interpreting and using results Demonstrates understanding / competency    Patient understands prevention, detection, and treatment of acute complications. Demonstrates understanding / competency    Patient understands prevention, detection, and treatment of chronic complications. Demonstrates understanding / competency    Patient understands how to develop strategies to address psychosocial issues. Demonstrates understanding / competency    Patient understands how to develop strategies to promote health/change behavior. Needs Review      Outcomes   Expected Outcomes Demonstrated interest in learning. Expect positive outcomes  Future DMSE 6 months    Program Status Not Completed      Subsequent Visit   Since your last visit have you continued or begun to take your medications as prescribed? Yes    Since your last visit have you experienced any weight changes? Gain    Weight Gain (lbs) 6    Since your last visit, are you checking your blood glucose at least once a day? Yes             Individualized Plan for Diabetes Self-Management Training:   Learning Objective:  Patient will have a greater understanding of diabetes self-management. Patient education plan is to attend individual and/or group sessions per assessed needs and concerns.   Plan:   Patient Instructions  Simple meal planning - aim to have a protein with each meal  Sheet pan meal (chicken, vegetables, potatoes)   Bean soup with salad  Salad with boiled egg or cottage  cheese, fruit  Pasta salad, cottage cheese or cheese or boiled egg, fruit  Tuna sandwich on spinach, German dark wheat bread, baby carrots Increase your vegetable intake  Find a way to get exercise back.   Get in the [facility] as you are able.   Continue to wheel around your neighborhood. You tube chair exercises:  Aim for 15-20 minutes 3 times per week. Physical therapists that do seated exercise programs   Sit and Be Fit  Resources: http://schroeder.biz/ (meal planning and recipes) DiabetesSelfManagement.com (recipes) http://cook-fox.com/ (free virtual conference 11/22/2020) http://www.fernandez-meyer.com/       Expected Outcomes:  Demonstrated interest in learning. Expect positive outcomes  Education material provided:   If problems or questions, patient to contact team via:  Phone  Future DSME appointment: 6 months

## 2020-11-24 ENCOUNTER — Encounter: Payer: Self-pay | Admitting: Podiatry

## 2020-11-24 ENCOUNTER — Other Ambulatory Visit: Payer: Self-pay

## 2020-11-24 ENCOUNTER — Encounter: Payer: Self-pay | Admitting: Internal Medicine

## 2020-11-24 ENCOUNTER — Ambulatory Visit (INDEPENDENT_AMBULATORY_CARE_PROVIDER_SITE_OTHER): Payer: Medicare Other | Admitting: Podiatry

## 2020-11-24 DIAGNOSIS — M79675 Pain in left toe(s): Secondary | ICD-10-CM | POA: Diagnosis not present

## 2020-11-24 DIAGNOSIS — E108 Type 1 diabetes mellitus with unspecified complications: Secondary | ICD-10-CM | POA: Diagnosis not present

## 2020-11-24 DIAGNOSIS — E1065 Type 1 diabetes mellitus with hyperglycemia: Secondary | ICD-10-CM

## 2020-11-24 DIAGNOSIS — B351 Tinea unguium: Secondary | ICD-10-CM | POA: Diagnosis not present

## 2020-11-24 DIAGNOSIS — IMO0002 Reserved for concepts with insufficient information to code with codable children: Secondary | ICD-10-CM

## 2020-11-24 DIAGNOSIS — M79674 Pain in right toe(s): Secondary | ICD-10-CM | POA: Diagnosis not present

## 2020-11-25 ENCOUNTER — Ambulatory Visit (INDEPENDENT_AMBULATORY_CARE_PROVIDER_SITE_OTHER): Payer: Medicare Other

## 2020-11-25 VITALS — Ht 70.0 in | Wt 190.0 lb

## 2020-11-25 DIAGNOSIS — Z0000 Encounter for general adult medical examination without abnormal findings: Secondary | ICD-10-CM

## 2020-11-25 NOTE — Progress Notes (Addendum)
Subjective:   Joyce Brown is a 70 y.o. female who presents for Medicare Annual (Subsequent) preventive examination.  Review of Systems: Defer to PCP.  Cardiac Risk Factors include: advanced age (>24mn, >>17women);diabetes mellitus  Objective:   Vitals: Ht '5\' 10"'$  (1.778 m)   Wt 190 lb (86.2 kg)   BMI 27.26 kg/m   Body mass index is 27.26 kg/m.  Advanced Directives 11/25/2020 11/20/2019 08/15/2019 06/27/2019 02/06/2019 10/25/2018 08/17/2018  Does Patient Have a Medical Advance Directive? Yes No No No No No Yes  Type of Advance Directive [place] Does patient want to make changes to medical advance directive? No - Patient declined - - - - - -  Copy of [place] Chart? No - copy requested - - - - - -  Would patient like information on creating a medical advance directive? - No - Patient declined No - Patient declined No - Patient declined No - Patient declined - -   Tobacco Social History   Tobacco Use  Smoking Status Never  Smokeless Tobacco Never     Clinical Intake:  Pre-visit preparation completed: Yes  Pain Score: 0-No pain  How often do you need to have someone help you when you read instructions, pamphlets, or other written materials from your doctor or pharmacy?: 1 - Never What is the last grade level you completed in school?: College  Interpreter Needed?: No  Past Medical History:  Diagnosis Date   Cerebellar ataxia ([place]    Diabetes mellitus without complication ([place]    Gait abnormality 11/07/2018   Hyperlipidemia    [place]ulmonary embolism ([place]    Sleep apnea    Spinal stenosis    Past Surgical History:  Procedure Laterality Date   ABDOMINAL HYSTERECTOMY     ADENOIDECTOMY     ANKLE SURGERY Left    BILATERAL CARPAL TUNNEL RELEASE     BREAST BIOPSY Left 2019   benign   ELBOW SURGERY     GASTRIC BYPASS  2014   HAND NEUROPLASTY     radio denervation  05/21/2020   TONSILLECTOMY     Family History   Problem Relation Age of Onset   Cervical cancer Mother    Ovarian cancer Mother    Dementia Father    Retinal detachment Father    Heart attack Father    Diabetes Father    Stroke Father    Hypertension Father    Ovarian cancer Sister    Arthritis Sister    Arthritis Sister    Arthritis Sister    Glaucoma Paternal Grandfather    Social History   Socioeconomic History   Marital status: Single    Spouse name: Not on file   Number of children: 0   Years of education: 16   Highest education level: Bachelor's degree (e.g., BA, AB, BS)  Occupational History   Occupation: Retired  Tobacco Use   Smoking status: Never   Smokeless tobacco: Never  Vaping Use   Vaping Use: Never used  Substance and Sexual Activity   Alcohol use: Never   Drug use: Never   Sexual activity: Not Currently  Other Topics Concern   Not on file  Social History Narrative   [place]    Patient has 2 cats- Monica and F[place]mendorf   Patient has 4 sisters- [place]an Mar[place] RCooter and [place]   Patient swims for exercise.   Social Determinants  of Health   Financial Resource Strain: Low Risk    Difficulty of Paying Living Expenses: Not hard at all  Food Insecurity: No Food Insecurity   Worried About [place] in the Last Year: Never true   [place] in the Last Year: Never true  Transportation Needs: No Transportation Needs   Lack of Transportation (Medical): No   Lack of Transportation (Non-Medical): No  Physical Activity: Sufficiently Active   Days of Exercise per Week: 3 days   Minutes of Exercise per Session: 50 min  Stress: No Stress Concern Present   Feeling of Stress : Only a little  Social Connections: Socially Isolated   Frequency of Communication with Friends and Family: More than three times a week   Frequency of Social Gatherings with Friends and Family: Twice a week   Attends Religious Services: Never   Marine scientist or  Organizations: No   Attends Archivist Meetings: Never   Marital Status: Never married   Outpatient Encounter Medications as of 11/25/2020  Medication Sig   acetaminophen (TYLENOL) 325 MG tablet Take 650 mg by mouth every 6 (six) hours as needed for moderate pain.    atorvastatin (LIPITOR) 40 MG tablet TAKE ONE TABLET BY MOUTH DAILY AT 6PM   CALCIUM CITRATE PO Take by mouth.   carboxymethylcellulose (REFRESH PLUS) 0.5 % SOLN Apply to eye.   Cholecalciferol (VITAMIN D3) 75 MCG (3000 UT) TABS Take 75 mcg by mouth daily.    Continuous Blood Gluc Sensor (DEXCOM G6 SENSOR) MISC 1 each by Other route daily.    Continuous Blood Gluc Transmit (DEXCOM G6 TRANSMITTER) MISC 1 each by Misc.(Non-Drug; Combo Route) route. Use as directed for continuous glucose monitoring. Reuse transmitter for 90 days then discard and replace.   glucose blood test strip ASCENSIA CONTOUR NEXT ONE: Use 1 strip to check blood sugar daily   INPEN 100-BLUE-NOVOLOG-FIASP DEVI CONSULT MD FOR APP SETTINGS.   insulin aspart (NOVOLOG PENFILL) cartridge MAX DAILY DOSE OF 60 UNITS per correction scale   insulin degludec (TRESIBA FLEXTOUCH) 100 UNIT/ML FlexTouch Pen Inject 16 Units into the skin daily.   Insulin Pen Needle (B-D UF III MINI PEN NEEDLES) 31G X 5 MM MISC USE FOUR TIMES A DAY AS DIRECTED   Insulin Syringe-Needle U-100 31G X 15/64" 0.5 ML MISC 1 Device by Does not apply route daily.   Microlet Lancets MISC 3 (three) times daily.   Multiple Vitamins-Minerals (BARIATRIC FUSION PO) [The details of the medication are not available because there are pending changes by a home health clinician.]   mycophenolate (CELLCEPT) 500 MG tablet Take 2 tablets (1,000 mg total) by mouth 2 (two) times daily.   oxybutynin (DITROPAN) 5 MG tablet TAKE ONE TABLET BY MOUTH TWICE A DAY   sertraline (ZOLOFT) 50 MG tablet TAKE ONE TO TWO TABLETS BY MOUTH EVERY DAY   tiZANidine (ZANAFLEX) 2 MG tablet TAKE 1 TO 2 TABLETS BY MOUTH AT BEDTIME AS  NEEDED FOR MUSCLE SPASMS   calcium carbonate (OSCAL) 1500 (600 Ca) MG TABS tablet Take by mouth. (Patient not taking: Reported on 11/25/2020)   insulin detemir (LEVEMIR) 100 UNIT/ML injection 14 units at bedtime (Patient not taking: Reported on 11/25/2020)   Multiple Vitamin (MULTIVITAMIN) capsule Take 4 capsules by mouth daily. Bariatric fusion    sertraline (ZOLOFT) 100 MG tablet Take 1 tab PO QD   No facility-administered encounter medications on file as of 11/25/2020.   Activities of Daily Living In  your present state of health, do you have any difficulty performing the following activities: 11/25/2020  Hearing? N  Vision? N  Difficulty concentrating or making decisions? N  Walking or climbing stairs? Y  Dressing or bathing? N  Doing errands, shopping? N  Preparing Food and eating ? N  Using the Toilet? N  In the past six months, have you accidently leaked urine? Y  Do you have problems with loss of bowel control? N  Managing your Medications? N  Managing your Finances? N  Housekeeping or managing your Housekeeping? N  Some recent data might be hidden   Patient Care Team: Lind Covert, MD as PCP - General (Family Medicine) Terence Lux, MD as Referring Physician (Neurology) [facility], Melanie Crazier, MD as Consulting Physician (Endocrinology)    Assessment:   This is a routine wellness examination for [employer].  Exercise Activities and Dietary recommendations Current Exercise Habits: Home exercise routine, Type of exercise: walking, Time (Minutes): 50, Frequency (Times/Week): 3, Weekly Exercise (Minutes/Week): 150, Exercise limited by: orthopedic condition(s)   Goals      Weight (lb) < 180 lb (81.6 kg)     Patient broke her foot mid July.  Patient has got off track with exercise and meal planning.         Fall Risk Fall Risk  11/25/2020 09/12/2020 08/01/2020 11/20/2019 08/15/2019  Falls in the past year? '1 1 1 '$ 0 0  Number falls in past yr: 1 0 0 0 -  Injury  with Fall? 1 0 0 0 -  Risk for fall due to : Impaired balance/gait;Impaired mobility - - - -  Follow up Falls prevention discussed - - - -   Patient uses a wheelchair to ambulate. Patient is able to stand for short periods of time.   Is the patient's home free of loose throw rugs in walkways, pet beds, electrical cords, etc?   yes      Grab bars in the bathroom? yes      Handrails on the stairs?   yes      Adequate lighting?   yes  Patient rating of health (0-10) scale: 9    Depression Screen PHQ 2/9 Scores 11/25/2020 08/01/2020 08/15/2019 06/27/2019  PHQ - 2 Score 1 1 0 0  PHQ- 9 Score - 3 - -    Cognitive Function 6CIT Screen 11/25/2020  What Year? 0 points  What month? 0 points  What time? 0 points  Count back from 20 0 points  Months in reverse 0 points  Repeat phrase 0 points  Total Score 0   Immunization History  Administered Date(s) Administered   Fluad Quad(high Dose 65+) 11/20/2019   Influenza, High Dose Seasonal PF 01/06/2017   Influenza, Seasonal, Injecte, Preservative Fre 12/15/2007, 12/16/2008, 12/15/2009, 12/18/2010, 12/20/2011, 01/04/2013   Influenza,inj,Quad PF,6+ Mos 12/06/2017, 11/15/2018   PFIZER Comirnaty(Gray Top)Covid-19 Tri-Sucrose Vaccine 04/29/2019, 05/22/2019, 01/23/2020, 07/01/2020   PFIZER(Purple Top)SARS-COV-2 Vaccination 04/29/2019, 05/22/2019, 01/23/2020   Pneumococcal Conjugate-13 03/20/2018   Pneumococcal Polysaccharide-23 02/12/2005, 07/22/2016   Tdap 11/23/2013, 07/25/2019   Zoster Recombinat (Shingrix) 07/21/2017, 11/04/2017   Screening Tests Health Maintenance  Topic Date Due   DTAP VACCINES (1) 11/02/1950   INFLUENZA VACCINE  10/13/2020   COVID-19 Vaccine (5 - Booster for Pfizer series) 10/31/2020   HEMOGLOBIN A1C  12/19/2020   OPHTHALMOLOGY EXAM  05/29/2021   URINE MICROALBUMIN  06/19/2021   PNA vac Low Risk Adult (2 of 2 - PPSV23) 07/22/2021   FOOT EXAM  08/19/2021   MAMMOGRAM  09/30/2022   COLONOSCOPY (Pts 45-6yr Insurance  coverage will need to be confirmed)  03/16/2023   DTaP/Tdap/Td (3 - Td or Tdap) 07/24/2029   TETANUS/TDAP  07/24/2029   DEXA SCAN  Completed   Zoster Vaccines- Shingrix  Completed   HPV VACCINES  Aged Out   Cancer Screenings: Lung: Low Dose CT Chest recommended if Age 68-80years, 30 pack-year currently smoking OR have quit w/in 15years. Patient does not qualify. Breast:  Up to date on Mammogram? Yes   Up to date of Bone Density/Dexa? Yes Colorectal: UTD  Additional Screenings: Hepatitis C Screening: Completed  HIV Screening: Completed   Plan:  PCP apt scheduled for 12/16/2020. Plan to get Flu vaccine at PCP visit.  We should have the NEW covid booster soon.   I have personally reviewed and noted the following in the patient's chart:   Medical and social history Use of alcohol, tobacco or illicit drugs  Current medications and supplements Functional ability and status Nutritional status Physical activity Advanced directives List of other physicians Hospitalizations, surgeries, and ER visits in previous 12 months Vitals Screenings to include cognitive, depression, and falls Referrals and appointments  In addition, I have reviewed and discussed with patient certain preventive protocols, quality metrics, and best practice recommendations. A written personalized care plan for preventive services as well as general preventive health recommendations were provided to patient.  EDorna Bloom CGrand Ronde 11/25/2020    I have reviewed this visit and agree with the documentation.  MNewell

## 2020-11-25 NOTE — Patient Instructions (Addendum)
You spoke to Joyce Brown, [place] for your annual wellness visit.  We discussed goals:   Goals      Weight (lb) < 180 lb (81.6 kg)     Patient broke her foot mid July.  Patient has got off track with exercise and meal planning.         We also discussed recommended health maintenance. As discussed, you are due for:  Health Maintenance  Topic Date Due   DTAP VACCINES (1) 11/02/1950   INFLUENZA VACCINE  10/13/2020   COVID-19 Vaccine (5 - Booster for Pfizer series) 10/31/2020   HEMOGLOBIN A1C  12/19/2020   OPHTHALMOLOGY EXAM  05/29/2021   URINE MICROALBUMIN  06/19/2021   PNA vac Low Risk Adult (2 of 2 - PPSV23) 07/22/2021   FOOT EXAM  08/19/2021   MAMMOGRAM  09/30/2022   COLONOSCOPY (Pts 45-54yr Insurance coverage will need to be confirmed)  03/16/2023   DTaP/Tdap/Td (3 - Td or Tdap) 07/24/2029   TETANUS/TDAP  07/24/2029   DEXA SCAN  Completed   Zoster Vaccines- Shingrix  Completed   HPV VACCINES  Aged Out   PCP apt scheduled for 12/16/2020. Plan to get Flu vaccine at PCP visit.  We should have the NEW covid booster soon.   Preventive Care 631Years and Older, Female Preventive care refers to lifestyle choices and visits with your health care provider that can promote health and wellness. This includes: A yearly physical exam. This is also called an annual wellness visit. Regular dental and eye exams. Immunizations. Screening for certain conditions. Healthy lifestyle choices, such as: Eating a healthy diet. Getting regular exercise. Not using drugs or products that contain nicotine and tobacco. Limiting alcohol use. What can I expect for my preventive care visit? Physical exam Your health care provider will check your: Height and weight. These may be used to calculate your BMI (body mass index). BMI is a measurement that tells if you are at a healthy weight. Heart rate and blood pressure. Body temperature. Skin for abnormal spots. Counseling Your health care  provider may ask you questions about your: Past medical problems. Family's medical history. Alcohol, tobacco, and drug use. Emotional well-being. Home life and relationship well-being. Sexual activity. Diet, exercise, and sleep habits. History of falls. Memory and ability to understand (cognition). Work and work eStatistician Pregnancy and menstrual history. Access to firearms. What immunizations do I need? Vaccines are usually given at various ages, according to a schedule. Your health care provider will recommend vaccines for you based on your age, medical history, and lifestyle or other factors, such as travel or where you work. What tests do I need? Blood tests Lipid and cholesterol levels. These may be checked every 5 years, or more often depending on your overall health. Hepatitis C test. Hepatitis B test. Screening Lung cancer screening. You may have this screening every year starting at age 11635if you have a 30-pack-year history of smoking and currently smoke or have quit within the past 15 years. Colorectal cancer screening. All adults should have this screening starting at age 11646and continuing until age 68 Your health care provider may recommend screening at age 1524if you are at increased risk. You will have tests every 1-10 years, depending on your results and the type of screening test. Diabetes screening. This is done by checking your blood sugar (glucose) after you have not eaten for a while (fasting). You may have this done every 1-3 years. Mammogram. This may be done every  1-2 years. Talk with your health care provider about how often you should have regular mammograms. Abdominal aortic aneurysm (AAA) screening. You may need this if you are a current or former smoker. BRCA-related cancer screening. This may be done if you have a family history of breast, ovarian, tubal, or peritoneal cancers. Other tests STD (sexually transmitted disease) testing, if you are at  risk. Bone density scan. This is done to screen for osteoporosis. You may have this done starting at age 36. Talk with your health care provider about your test results, treatment options, and if necessary, the need for more tests. Follow these instructions at home: Eating and drinking  Eat a diet that includes fresh fruits and vegetables, whole grains, lean protein, and low-fat dairy products. Limit your intake of foods with high amounts of sugar, saturated fats, and salt. Take vitamin and mineral supplements as recommended by your health care provider. Do not drink alcohol if your health care provider tells you not to drink. If you drink alcohol: Limit how much you have to 0-1 drink a day. Be aware of how much alcohol is in your drink. In the [place] one drink equals one 12 oz bottle of beer (355 mL), one 5 oz glass of wine (148 mL), or one 1 oz glass of hard liquor (44 mL). Lifestyle Take daily care of your teeth and gums. Brush your teeth every morning and night with fluoride toothpaste. Floss one time each day. Stay active. Exercise for at least 30 minutes 5 or more days each week. Do not use any products that contain nicotine or tobacco, such as cigarettes, e-cigarettes, and chewing tobacco. If you need help quitting, ask your health care provider. Do not use drugs. If you are sexually active, practice safe sex. Use a condom or other form of protection in order to prevent STIs (sexually transmitted infections). Talk with your health care provider about taking a low-dose aspirin or statin. Find healthy ways to cope with stress, such as: Meditation, yoga, or listening to music. Journaling. Talking to a trusted person. Spending time with friends and family. Safety Always wear your seat belt while driving or riding in a vehicle. Do not drive: If you have been drinking alcohol. Do not ride with someone who has been drinking. When you are tired or distracted. While texting. Wear a helmet  and other protective equipment during sports activities. If you have firearms in your house, make sure you follow all gun safety procedures. What's next? Visit your health care provider once a year for an annual wellness visit. Ask your health care provider how often you should have your eyes and teeth checked. Stay up to date on all vaccines. This information is not intended to replace advice given to you by your health care provider. Make sure you discuss any questions you have with your health care provider. Document Revised: 05/09/2020 Document Reviewed: 02/23/2018 Elsevier Patient Education  2022 [employer].   Our clinic's number is [phone]. Please call with questions or concerns about what we discussed today.

## 2020-11-27 NOTE — Progress Notes (Addendum)
  Subjective:  Patient ID: Joyce Brown, female    DOB: 10-12-1950,  MRN: [id number]  70 y.o. female presents preventative diabetic foot care and thick, elongated toenails b/l feet which are tender when wearing enclosed shoe gear.  Patient states blood glucose was 86 mg/dl this morning. In office, her reading was 346 mg/dl.   PCP is Lind Covert, MD , and last visit was 11/20/2019. She is also followed by Dr. Kelton Pillar for her diabetes.  She fractured her right foot in July after a hypoglycemic episode. This is being managed by Dr. Daylene Katayama. She was immobilized in a cam walker, but was recently instructed to return to her supportive shoe.  She states she had numbness of between toes on her right foot on 824 and under top half of the great toe. She states numbness between toes of right foot has decreased since she is out of the boot, but now is present on the right hallux. She also states over the past 36-48 hours, she is having numbness on the left hallux as well.  No Known Allergies  Review of Systems: Negative except as noted in the HPI.   Objective:  Vascular Examination: Vascular status intact b/l with palpable pedal pulses. CFT immediate b/l. No edema. No pain with calf compression b/l. Skin temperature gradient WNL b/l. Pedal hair absent.  Neurological Examination: Rechecked her sensation with 10 gram SWMF and it remains grossly intact b/l.  Dermatological Examination: Pedal skin with normal turgor, texture and tone b/l. Toenails 1-5 b/l thick, discolored, elongated with subungual debris and pain on dorsal palpation. No hyperkeratotic lesions noted b/l.   Musculoskeletal Examination: Muscle strength 5/5 to b/l LE. Hammertoes 2-5 b/l. Patient utilizes wheelchair for mobility.   Hemoglobin A1C Latest Ref Rng & Units 06/19/2020 02/18/2020  HGBA1C 4.0 - 5.6 % 6.7(A) 6.9(A)  Some recent data might be hidden   Radiographs: None Assessment:   1. Pain due to  onychomycosis of toenails of both feet   2. Type I diabetes mellitus with complication, uncontrolled ([place])    Plan:  -Examined patient. -Regarding numbness in toes, we will monitor. If symptoms increase, she was instructed to follow up with Neurology. She related understanding. -Continue diabetic foot care principles: inspect feet daily, monitor glucose as recommended by PCP and/or Endocrinologist, and follow prescribed diet per PCP, Endocrinologist and/or dietician. -Patient to continue soft, supportive shoe gear daily. -Toenails 1-5 b/l were debrided in length and girth with sterile nail nippers and dremel without iatrogenic bleeding.  -Patient to report any pedal injuries to medical professional immediately. -Patient/POA to call should there be question/concern in the interim.  Return in about 3 months (around 02/23/2021).  Marzetta Board, DPM

## 2020-12-01 DIAGNOSIS — H40013 Open angle with borderline findings, low risk, bilateral: Secondary | ICD-10-CM | POA: Diagnosis not present

## 2020-12-04 ENCOUNTER — Encounter: Payer: Self-pay | Admitting: Internal Medicine

## 2020-12-05 ENCOUNTER — Encounter: Payer: Self-pay | Admitting: Internal Medicine

## 2020-12-09 ENCOUNTER — Ambulatory Visit (INDEPENDENT_AMBULATORY_CARE_PROVIDER_SITE_OTHER): Payer: Medicare Other | Admitting: Neurology

## 2020-12-09 ENCOUNTER — Encounter: Payer: Self-pay | Admitting: Neurology

## 2020-12-09 ENCOUNTER — Other Ambulatory Visit: Payer: Self-pay

## 2020-12-09 VITALS — BP 159/81 | HR 64 | Ht 70.0 in | Wt 190.0 lb

## 2020-12-09 DIAGNOSIS — E1140 Type 2 diabetes mellitus with diabetic neuropathy, unspecified: Secondary | ICD-10-CM

## 2020-12-09 DIAGNOSIS — E1142 Type 2 diabetes mellitus with diabetic polyneuropathy: Secondary | ICD-10-CM | POA: Diagnosis not present

## 2020-12-09 DIAGNOSIS — G119 Hereditary ataxia, unspecified: Secondary | ICD-10-CM

## 2020-12-09 DIAGNOSIS — E538 Deficiency of other specified B group vitamins: Secondary | ICD-10-CM

## 2020-12-09 HISTORY — DX: Type 2 diabetes mellitus with diabetic neuropathy, unspecified: E11.40

## 2020-12-09 NOTE — Progress Notes (Signed)
Reason for visit: Cerebellar ataxia  Joyce Brown is an 70 y.o. female  History of present illness:  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  She has a history of diabetes and just recently has started developing some numbness in the toes.  She has had diabetes since 2007.  She fell and broke her right foot on 30 September 2020 when she had a syncopal event associated with hypoglycemia.  The patient does have a continuous glucose monitor.  Her diabetes has been under good control, her last hemoglobin A1c in April 2022 was 6.7.  The patient denies any discomfort in the feet but she does have some numbness.  She has had some problems with back pain and has been seen by Dr. Ernestina Patches.  She had ablation procedures on the back which has helped.  She returns to the office today for an evaluation.  She denies issues with swallowing or choking.  She remains on CellCept.  Past Medical History:  Diagnosis Date   Cerebellar ataxia ([place])    Diabetes mellitus without complication ([place])    Foot fracture 2022   Gait abnormality 11/07/2018   Hyperlipidemia    Pulmonary embolism ([place])    Sleep apnea    Spinal stenosis     Past Surgical History:  Procedure Laterality Date   ABDOMINAL HYSTERECTOMY     ADENOIDECTOMY     ANKLE SURGERY Left    BILATERAL CARPAL TUNNEL RELEASE     BREAST BIOPSY Left 2019   benign   ELBOW SURGERY     GASTRIC BYPASS  2014   HAND NEUROPLASTY     radio denervation  05/21/2020   TONSILLECTOMY      Family History  Problem Relation Age of Onset   Cervical cancer Mother    Ovarian cancer Mother    Dementia Father    Retinal detachment Father    Heart attack Father    Diabetes Father    Stroke Father    Hypertension Father    Ovarian cancer Sister    Arthritis Sister    Arthritis Sister    Arthritis Sister     Glaucoma Paternal Grandfather     Social history:  reports that she has never smoked. She has never used smokeless tobacco. She reports that she does not drink alcohol and does not use drugs.   No Known Allergies  Medications:  Prior to Admission medications   Medication Sig Start Date End Date Taking? Authorizing Provider  acetaminophen (TYLENOL) 325 MG tablet Take 650 mg by mouth every 6 (six) hours as needed for moderate pain.    Yes [provider]  atorvastatin (LIPITOR) 40 MG tablet TAKE ONE TABLET BY MOUTH DAILY AT 6PM 09/25/20  Yes Chambliss, Jeb Levering, MD  CALCIUM CITRATE PO Take by mouth.   Yes [provider]  carboxymethylcellulose (REFRESH PLUS) 0.5 % SOLN Apply to eye. 08/22/17  Yes [provider]  Cholecalciferol (VITAMIN D3) 75 MCG (3000 UT) TABS Take 75 mcg by mouth daily.    Yes [provider]  Continuous Blood Gluc Sensor (DEXCOM G6 SENSOR) MISC 1 each by Other route daily.    Yes [provider]  Continuous Blood Gluc Transmit (DEXCOM G6 TRANSMITTER) MISC 1 each by Misc.(Non-Drug; Combo Route) route. Use as directed for continuous glucose monitoring. Reuse transmitter for 90 days then  discard and replace.   Yes [provider]  glucose blood test strip ASCENSIA CONTOUR NEXT ONE: Use 1 strip to check blood sugar daily 04/09/20  Yes Shamleffer, Melanie Crazier, MD  INPEN 100-BLUE-NOVOLOG-FIASP DEVI CONSULT MD FOR APP SETTINGS. 07/25/20  Yes Shamleffer, Melanie Crazier, MD  insulin aspart (NOVOLOG PENFILL) cartridge MAX DAILY DOSE OF 60 UNITS per correction scale 06/19/20  Yes Shamleffer, Melanie Crazier, MD  insulin degludec (TRESIBA FLEXTOUCH) 100 UNIT/ML FlexTouch Pen Inject 16 Units into the skin daily. 06/19/20  Yes Shamleffer, Melanie Crazier, MD  Insulin Pen Needle (B-D UF III MINI PEN NEEDLES) 31G X 5 MM MISC USE FOUR TIMES A DAY AS DIRECTED 10/15/20  Yes Shamleffer, Melanie Crazier, MD  Insulin Syringe-Needle U-100 31G X  15/64" 0.5 ML MISC 1 Device by Does not apply route daily. 04/19/19  Yes Shamleffer, Melanie Crazier, MD  Microlet Lancets MISC 3 (three) times daily. 08/26/17  Yes [provider]  Multiple Vitamin (MULTIVITAMIN) capsule Take 4 capsules by mouth daily. Bariatric fusion    Yes [provider]  mycophenolate (CELLCEPT) 500 MG tablet Take 2 tablets (1,000 mg total) by mouth 2 (two) times daily. 05/26/20  Yes Kathrynn Ducking, MD  oxybutynin (DITROPAN) 5 MG tablet TAKE ONE TABLET BY MOUTH TWICE A DAY 06/30/20  Yes Chambliss, Jeb Levering, MD  sertraline (ZOLOFT) 50 MG tablet TAKE ONE TO TWO TABLETS BY MOUTH EVERY DAY 11/20/20  Yes Chambliss, Jeb Levering, MD  tiZANidine (ZANAFLEX) 2 MG tablet TAKE 1 TO 2 TABLETS BY MOUTH AT BEDTIME AS NEEDED FOR MUSCLE SPASMS 09/29/20  Yes Lind Covert, MD  insulin detemir (LEVEMIR) 100 UNIT/ML injection 14 units at bedtime Patient not taking: No sig reported 11/15/17   [provider]    ROS:  Out of a complete 14 system review of symptoms, the patient complains only of the following symptoms, and all other reviewed systems are negative.  Gait ataxia Low back pain Recent right foot fracture  Blood pressure (!) 159/81, pulse 64, height 5\' 10"  (1.778 m), weight 190 lb (86.2 kg).  Physical Exam  General: The patient is alert and cooperative at the time of the examination.  Skin: No significant peripheral edema is noted.   Neurologic Exam  Mental status: The patient is alert and oriented x 3 at the time of the examination. The patient has apparent normal recent and remote memory, with an apparently normal attention span and concentration ability.   Cranial nerves: Facial symmetry is present. Speech is ataxic in nature, not aphasic.  Extraocular movements are full. Visual fields are full.  Motor: The patient has good strength in all 4 extremities.  Sensory examination: Soft touch sensation is symmetric on the face, arms, and legs.   The patient has no definite stocking pattern pinprick sensory deficit in the legs.  Vibration sensation is symmetric throughout.  Position sense is decreased in the feet bilaterally.  Coordination: The patient has dysmetria with finger-nose-finger bilaterally, but this is more prominent on the left than the right.  Dysmetria seen with heel-to-shin bilaterally.  Gait and station: The patient was not ambulated today, she is in a wheelchair.  Reflexes: Deep tendon reflexes are symmetric.   Assessment/Plan:  1.  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  She appears to be developing some numbness in the feet associated with her diabetes.  We will continue the CellCept for  now, in the future she will follow-up with Dr. Krista Blue.  Jill Alexanders MD 12/09/2020 9:25 AM  [facility] [address] [place], [place] [address]  Phone [phone] Fax [phone]

## 2020-12-12 ENCOUNTER — Encounter: Payer: Self-pay | Admitting: Internal Medicine

## 2020-12-12 LAB — COMPREHENSIVE METABOLIC PANEL
ALT: 15 IU/L (ref 0–32)
AST: 21 IU/L (ref 0–40)
Albumin/Globulin Ratio: 2.2 (ref 1.2–2.2)
Albumin: 4.3 g/dL (ref 3.8–4.8)
Alkaline Phosphatase: 71 IU/L (ref 44–121)
BUN/Creatinine Ratio: 25 (ref 12–28)
BUN: 20 mg/dL (ref 8–27)
Bilirubin Total: 0.3 mg/dL (ref 0.0–1.2)
CO2: 23 mmol/L (ref 20–29)
Calcium: 9.5 mg/dL (ref 8.7–10.3)
Chloride: 106 mmol/L (ref 96–106)
Creatinine, Ser: 0.79 mg/dL (ref 0.57–1.00)
Globulin, Total: 2 g/dL (ref 1.5–4.5)
Glucose: 142 mg/dL — ABNORMAL HIGH (ref 70–99)
Potassium: 4.5 mmol/L (ref 3.5–5.2)
Sodium: 143 mmol/L (ref 134–144)
Total Protein: 6.3 g/dL (ref 6.0–8.5)
eGFR: 81 mL/min/{1.73_m2} (ref >59)

## 2020-12-12 LAB — ENA+DNA/DS+SJORGEN'S
ENA RNP Ab: 1.8 AI — ABNORMAL HIGH (ref 0.0–0.9)
ENA SM Ab Ser-aCnc: <0.2 AI (ref 0.0–0.9)
ENA SSA (RO) Ab: <0.2 AI (ref 0.0–0.9)
ENA SSB (LA) Ab: <0.2 AI (ref 0.0–0.9)
dsDNA Ab: 1 IU/mL (ref 0–9)

## 2020-12-12 LAB — MULTIPLE MYELOMA PANEL, SERUM
Albumin SerPl Elph-Mcnc: 3.9 g/dL (ref 2.9–4.4)
Albumin/Glob SerPl: 1.7 (ref 0.7–1.7)
Alpha 1: 0.2 g/dL (ref 0.0–0.4)
Alpha2 Glob SerPl Elph-Mcnc: 0.6 g/dL (ref 0.4–1.0)
B-Globulin SerPl Elph-Mcnc: 0.9 g/dL (ref 0.7–1.3)
Gamma Glob SerPl Elph-Mcnc: 0.7 g/dL (ref 0.4–1.8)
Globulin, Total: 2.4 g/dL (ref 2.2–3.9)
IgA/Immunoglobulin A, Serum: 144 mg/dL (ref 87–352)
IgG (Immunoglobin G), Serum: 720 mg/dL (ref 586–1602)
IgM (Immunoglobulin M), Srm: 31 mg/dL (ref 26–217)

## 2020-12-12 LAB — CBC WITH DIFFERENTIAL/PLATELET
Basophils Absolute: 0 10*3/uL (ref 0.0–0.2)
Basos: 1 %
EOS (ABSOLUTE): 0.1 10*3/uL (ref 0.0–0.4)
Eos: 2 %
Hematocrit: 40.8 % (ref 34.0–46.6)
Hemoglobin: 13 g/dL (ref 11.1–15.9)
Immature Grans (Abs): 0 10*3/uL (ref 0.0–0.1)
Immature Granulocytes: 0 %
Lymphocytes Absolute: 1.3 10*3/uL (ref 0.7–3.1)
Lymphs: 26 %
MCH: 29.3 pg (ref 26.6–33.0)
MCHC: 31.9 g/dL (ref 31.5–35.7)
MCV: 92 fL (ref 79–97)
Monocytes Absolute: 0.4 10*3/uL (ref 0.1–0.9)
Monocytes: 9 %
Neutrophils Absolute: 3 10*3/uL (ref 1.4–7.0)
Neutrophils: 62 %
Platelets: 249 10*3/uL (ref 150–450)
RBC: 4.44 x10E6/uL (ref 3.77–5.28)
RDW: 13.2 % (ref 11.7–15.4)
WBC: 4.9 10*3/uL (ref 3.4–10.8)

## 2020-12-12 LAB — LYME DISEASE SEROLOGY W/REFLEX: Lyme Total Antibody EIA: NEGATIVE

## 2020-12-12 LAB — ANGIOTENSIN CONVERTING ENZYME: Angio Convert Enzyme: 33 U/L (ref 14–82)

## 2020-12-12 LAB — VITAMIN B12: Vitamin B-12: 1360 pg/mL — ABNORMAL HIGH (ref 232–1245)

## 2020-12-12 LAB — ANA W/REFLEX: Anti Nuclear Antibody (ANA): POSITIVE — AB

## 2020-12-16 ENCOUNTER — Other Ambulatory Visit: Payer: Self-pay

## 2020-12-16 ENCOUNTER — Ambulatory Visit (INDEPENDENT_AMBULATORY_CARE_PROVIDER_SITE_OTHER): Payer: Medicare Other | Admitting: Family Medicine

## 2020-12-16 ENCOUNTER — Encounter: Payer: Self-pay | Admitting: Family Medicine

## 2020-12-16 VITALS — BP 142/70 | HR 67 | Temp 97.9°F | Wt 189.6 lb

## 2020-12-16 DIAGNOSIS — R350 Frequency of micturition: Secondary | ICD-10-CM | POA: Diagnosis not present

## 2020-12-16 DIAGNOSIS — E785 Hyperlipidemia, unspecified: Secondary | ICD-10-CM | POA: Diagnosis not present

## 2020-12-16 DIAGNOSIS — F4323 Adjustment disorder with mixed anxiety and depressed mood: Secondary | ICD-10-CM | POA: Diagnosis not present

## 2020-12-16 DIAGNOSIS — Z23 Encounter for immunization: Secondary | ICD-10-CM

## 2020-12-16 NOTE — Assessment & Plan Note (Signed)
Stable on zoloft 50 mg daily.  Mentions mild increase in irritability.  May try to increase dose to 100 mg to see if helps

## 2020-12-16 NOTE — Progress Notes (Signed)
    SUBJECTIVE:   CHIEF COMPLAINT / HPI:   Cholesterol Takes 40 mg lipitor daily without symptoms.  Last LDL 79.    Lesion Left Lower Leg Been there for at least a year. Scaly round lesion sometime more prominent than others.  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  No other lesions  Urinary Urgency Not respinding as well to oxybutynin  Wears pads 24-7.  No dysuria  HM Had colonoscopy in 2015 at [facility] clinic was normal  Dexa in 2018 - normal bone density     PERTINENT  PMH / PSH: had Spine ablation Dr Ernestina Patches in March that helped a lot No longer able to get transportation to swim   OBJECTIVE:   BP (!) 142/70   Pulse 67   Temp 97.9 F (36.6 C)   Wt 189 lb 9.6 oz (86 kg)   SpO2 99%   BMI 27.20 kg/m   Heart - Regular rate and rhythm.  No murmurs, gallops or rubs.    Lungs:  Normal respiratory effort, chest expands symmetrically. Lungs are clear to auscultation, no crackles or wheezes. Left lower leg - see picture. Scaly circular lesion with indistinct border    ASSESSMENT/PLAN:   Dyslipidemia Will try lipitor 80 mg daily.    Adjustment disorder with mixed anxiety and depressed mood Stable on zoloft 50 mg daily.  Mentions mild increase in irritability.  May try to increase dose to 100 mg to see if helps   Urinary frequency Due to neurologic condition.  Not at goal.  Will try increase dose of oxybutynin    Scaly Skin Lesion - consistent with tinea.  Treat with antifungal.  If not improving could be squamous cell cancer and would need bx   Lind Covert, MD [place]

## 2020-12-16 NOTE — Assessment & Plan Note (Signed)
Will try lipitor 80 mg daily.

## 2020-12-16 NOTE — Assessment & Plan Note (Signed)
Due to neurologic condition.  Not at goal.  Will try increase dose of oxybutynin

## 2020-12-16 NOTE — Patient Instructions (Signed)
Good to see you today - Thank you for coming in  Things we discussed today:  Cholesterol  Increase lipitor to 80 mg daily - 2 tabs and let me know if any symptoms   Leg lesion Try lamisil cream twice a day until gone.  If not gone in one month let me know  Urination Try Ditropan 1 1/2 tabs in the day to see if helps any and let me know   Mood Could try 2 zoloft to see if helps.  Would need to take for at least a month to see   Dexa - will repeat this in 2023   Colonoscopy in 2025  Please always bring your medication bottles  Come back to see me in 6 months

## 2020-12-17 ENCOUNTER — Encounter
Payer: Medicare Other | Attending: Physical Medicine and Rehabilitation | Admitting: Physical Medicine and Rehabilitation

## 2020-12-17 ENCOUNTER — Encounter: Payer: Self-pay | Admitting: Physical Medicine and Rehabilitation

## 2020-12-17 VITALS — BP 153/74 | HR 73 | Temp 98.9°F | Ht 70.0 in | Wt 190.0 lb

## 2020-12-17 DIAGNOSIS — M7918 Myalgia, other site: Secondary | ICD-10-CM | POA: Insufficient documentation

## 2020-12-17 DIAGNOSIS — G119 Hereditary ataxia, unspecified: Secondary | ICD-10-CM | POA: Diagnosis not present

## 2020-12-17 DIAGNOSIS — Z993 Dependence on wheelchair: Secondary | ICD-10-CM | POA: Insufficient documentation

## 2020-12-17 NOTE — Progress Notes (Signed)
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 Has has Myofascial pain and fibromyalgia.   Here for f/u on cerebellar ataxia and myofascial pain/trigger point injections.   Got the new back and cushion for w/c.  Came in July 2022-  Moved to first floor- in July 2022.  W/c back and seat has helped pain a lot in neck and back.   Broke R foot this AM- the 5th MTP-  Had hypoglycemic episode- and doesn't know how it occurred, but woke up on kitchen floor.  Dr Amalia Hailey- Podiatry said no surgery needed- which was great.  Pain now is 3/10- d/w PCP yesterday- about taking tylenol only-   Not having as many spasms- so thinking about going without Zanaflex. Usually takes q evening-   Hasn't gotten back in pool due to fracture- out of CAM boot now.     Plan: Reviewed latest blood work- has a (+) ANA, but per Neurology doesn't feel it's clinically significant. Went over all labs- CMP and CBC with diff- all look good.  Dr Jannifer Franklin in retiring in November 2022.  Will stay in same practice- Dr Lucianne Lei.    2. Has diagram for trigger points that are bothering her- Splenius capitus, R upper trap; and R low back-  Patient here for trigger point injections for myofascial pain.   Consent done and on chart.  Cleaned areas with alcohol and injected using a 27 gauge 1.5 inch needle  Injected  2.5 cc Using 1% Lidocaine with no EPI  Upper traps R only Levators Posterior scalenes Middle scalenes Splenius Capitus Pectoralis Major Rhomboids Infraspinatus Teres Major/minor Thoracic paraspinals Lumbar paraspinals- R only Other injections- cervical paraspinals B/ L   Patient's level of pain prior was 3/10 Current level of pain after injections is-  hasn't changed much  There was no bleeding or complications.  Patient was advised to drink a lot of water on day after injections to flush system Will have increased soreness for 12-48 hours after injections.  Can use Lidocaine patches the day AFTER injections Can use theracane on day of injections in places didn't inject Can use heating pad 4-6 hours AFTER injections   3. Can try without Tizanidine every evening- can try without it, but I'm concerned she might still need it. We will see.    4.  F/U in 6 months for trigger point injections and f/u on cerebellar ataxia.   5. To get new leg rests for w/c-  does push self with feet, but need sometimes.

## 2020-12-17 NOTE — Patient Instructions (Addendum)
Plan: Reviewed latest blood work- has a (+) ANA, but per Neurology doesn't feel it's clinically significant. Went over all labs- CMP and CBC with diff- all look good.  Dr Jannifer Franklin in retiring in November 2022.  Will stay in same practice- Dr Lucianne Lei.    2. Has diagram for trigger points that are bothering her- Splenius capitus, R upper trap; and R low back-  Patient here for trigger point injections for myofascial pain.   Consent done and on chart.  Cleaned areas with alcohol and injected using a 27 gauge 1.5 inch needle  Injected  2.5 cc Using 1% Lidocaine with no EPI  Upper traps R only Levators Posterior scalenes Middle scalenes Splenius Capitus Pectoralis Major Rhomboids Infraspinatus Teres Major/minor Thoracic paraspinals Lumbar paraspinals- R only Other injections- cervical paraspinals B/ L   Patient's level of pain prior was 3/10 Current level of pain after injections is- hasn't changed much  There was no bleeding or complications.  Patient was advised to drink a lot of water on day after injections to flush system Will have increased soreness for 12-48 hours after injections.  Can use Lidocaine patches the day AFTER injections Can use theracane on day of injections in places didn't inject Can use heating pad 4-6 hours AFTER injections   3. Can try without Tizanidine every evening- can try without it, but I'm concerned she might still need it. We will see.    4.  F/U in 6 months for trigger point injections and f/u on cerebellar ataxia.    5. To get new leg rests for w/c-  does push self with feet, but need sometimes.

## 2020-12-22 ENCOUNTER — Ambulatory Visit (INDEPENDENT_AMBULATORY_CARE_PROVIDER_SITE_OTHER): Payer: Medicare Other

## 2020-12-22 ENCOUNTER — Ambulatory Visit (INDEPENDENT_AMBULATORY_CARE_PROVIDER_SITE_OTHER): Payer: Medicare Other | Admitting: Podiatry

## 2020-12-22 ENCOUNTER — Other Ambulatory Visit: Payer: Self-pay

## 2020-12-22 DIAGNOSIS — S92351D Displaced fracture of fifth metatarsal bone, right foot, subsequent encounter for fracture with routine healing: Secondary | ICD-10-CM | POA: Diagnosis not present

## 2020-12-22 MED ORDER — GABAPENTIN 100 MG PO CAPS: 100.0000 mg | ORAL_CAPSULE | Freq: Three times a day (TID) | ORAL | 3 refills | Status: DC

## 2020-12-22 NOTE — Progress Notes (Signed)
HPI: 70 y.o. female presenting today for follow-up evaluation of a fracture that the patient sustained to the right foot.  Patient states that on 09/30/2020 she sustained a syncopal event and hit her right foot when she fell.  She finally presented to the emergency department on 10/05/2020 where x-rays were taken and she was diagnosed with a minimally displaced fracture of the right fifth metatarsal and referred here for follow-up.  Patient is doing much better and has no pain or tenderness associated to the right foot.  Patient does have a new complaint today regarding the pins-and-needles and numbness to her bilateral feet.  She says that it is slowly getting worse.  She has been diagnosed in the past with diabetic neuropathy.  She has not done anything for treatment.  She presents for further treatment and evaluation  Past Medical History:  Diagnosis Date   Cerebellar ataxia ([place])    Diabetes mellitus without complication ([place])    Diabetic neuropathy ([place]) 12/09/2020   Foot fracture 2022   Gait abnormality 11/07/2018   Hyperlipidemia    Pulmonary embolism ([facility])    Sleep apnea    Spinal stenosis      Physical Exam: General: The patient is alert and oriented x3 in no acute distress.  Dermatology: Skin is warm, dry and supple bilateral lower extremities. Negative for open lesions or macerations.  Vascular: Palpable pedal pulses bilaterally.  No erythema.  Minimal edema around the lateral aspect of the right foot with some ecchymosis. Capillary refill immediate  Neurological: Epicritic and protective threshold grossly intact bilaterally.  She does experience some pins-and-needles and burning sensation to the bilateral forefoot  Musculoskeletal Exam: Negative for any significant tenderness to the lateral aspect of the right forefoot overlying the fifth metatarsal.  Otherwise no pedal deformities  Radiographic Exam RT foot 10/05/2020: FINDINGS: There is an acute, mildly displaced oblique  fracture of the midshaft of the fifth metatarsal. This does not definitely extend to the joint space. There is no acute dislocation. There is diffuse osseous demineralization. Soft tissues swelling of the foot.   IMPRESSION: Acute fracture of the fifth metatarsal.  Radiographic exam RT foot today: Good demonstration of healing of the fifth metatarsal fracture.  Good alignment.  Callus formation noted with routine healing.  Assessment: 1.  Mildly displaced oblique fracture right fifth metatarsal, initial encounter 2.  Diabetes mellitus with peripheral polyneuropathy   Plan of Care:  1. Patient evaluated.  X-rays reviewed today and demonstrate good healing of the fifth metatarsal bone  2.  The patient is minimally ambulatory.   She may now resume full activity no restrictions 3.  In regards to the neuropathy, we did discuss different treatment options.  I believe it would be appropriate to initiate gabapentin.  Prescription for gabapentin 100 mg 3 times daily sent to pharmacy.  If this is beneficial and helps suppress the patient's neuropathic symptoms, recommend that she follows up with her PCP for long-term management and care 4.  Return to clinic as needed   Edrick Kins, DPM [facility]  Dr. Edrick Kins, DPM    [address] [place] [address]                Office [phone])  [phone]  Fax [phone]

## 2020-12-24 ENCOUNTER — Encounter: Payer: Self-pay | Admitting: Internal Medicine

## 2020-12-24 ENCOUNTER — Encounter: Payer: Self-pay | Admitting: Family Medicine

## 2020-12-24 DIAGNOSIS — Z23 Encounter for immunization: Secondary | ICD-10-CM | POA: Diagnosis not present

## 2020-12-25 ENCOUNTER — Ambulatory Visit (INDEPENDENT_AMBULATORY_CARE_PROVIDER_SITE_OTHER): Payer: Medicare Other | Admitting: Internal Medicine

## 2020-12-25 ENCOUNTER — Other Ambulatory Visit: Payer: Self-pay

## 2020-12-25 VITALS — BP 140/80 | HR 72 | Ht 70.0 in | Wt 187.4 lb

## 2020-12-25 DIAGNOSIS — E1065 Type 1 diabetes mellitus with hyperglycemia: Secondary | ICD-10-CM

## 2020-12-25 DIAGNOSIS — E1042 Type 1 diabetes mellitus with diabetic polyneuropathy: Secondary | ICD-10-CM | POA: Insufficient documentation

## 2020-12-25 LAB — POCT GLYCOSYLATED HEMOGLOBIN (HGB A1C): Hemoglobin A1C: 6.3 % — AB (ref 4.0–5.6)

## 2020-12-25 NOTE — Patient Instructions (Addendum)
-   Decrease Tresiba 15 units daily  - Change Novolog 8 units with Breakfast , 12 units with Lunch and 8 units supper   HOW TO TREAT LOW BLOOD SUGARS (Blood sugar LESS THAN 70 MG/DL) Please follow the RULE OF 15 for the treatment of hypoglycemia treatment (when your (blood sugars are less than 70 mg/dL)   STEP 1: Take 15 grams of carbohydrates when your blood sugar is low, which includes:  3-4 GLUCOSE TABS  OR 3-4 OZ OF JUICE OR REGULAR SODA OR ONE TUBE OF GLUCOSE GEL    STEP 2: RECHECK blood sugar in 15 MINUTES STEP 3: If your blood sugar is still low at the 15 minute recheck --> then, go back to STEP 1 and treat AGAIN with another 15 grams of carbohydrates.

## 2020-12-25 NOTE — Progress Notes (Signed)
Name: Joyce Brown  Age/ Sex: 70 y.o., female   MRN/ DOB: [id number], May 13, 1950     PCP: Lind Covert, MD   Reason for Endocrinology Evaluation: Type 1 Diabetes Mellitus  Initial Endocrine Consultative Visit: 01/17/2018    PATIENT IDENTIFIER: Joyce Brown is a 70 y.o. female with a past medical history of T1DM, cerebellar atrophy, asthma, OSA on CPAP and Hx of gastric bypass in 2014  . The patient has followed with Endocrinology clinic since 01/17/18 for consultative assistance with management of her diabetes.  Patient moved from [place] in September, 2019 to be close to her sister. She has been diagnosed with stiff person Syndrome but Dr. Jannifer Franklin thought this may be more of cerebellar atrophy. She was on  IVIG until 11/07/2018. 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 She has only had one DKA episode which was in 11/2017. Marland Kitchen Her hemoglobin A1c has ranged from 8.4% in 01/2017, peaking at 12.1 % in 01/2016  She is followed by Dr. 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 She has been off IVIG since 02/2018. He has been tapering her diazepam down and is currently on cellcept.    Started using the inpen 07/2018  SUBJECTIVE:   During the last visit (06/19/2020): A1c 6.7 % . We adjusted MDI regimen      Today (12/25/2020): Joyce Brown is here for a months follow up visit on diabetes.  She checks her blood sugars multiple times daily, using  CGM (Dexcom). The patient has had hypoglycemic episodes since the last clinic visit. The patient is symptomatic with these episodes.    She had a right 5th metatarsal fracture 09/2020 following a syncopal episode due to hypoglycemia with a BG reading of 48 mg/DL She has noted left foot numbness and sharp pains , was diagnosed with neuropathy through  Dr. Amalia Hailey, currently on Gabapentin 100 mg bedtime      HOME DIABETES REGIMEN:  Tresiba 16 units QHS Novolog 8 units  With Breakfast, 12 units with lunch and 9 units with supper  CF (BG - 110/60)    CONTINUOUS GLUCOSE MONITORING RECORD INTERPRETATION    Dates of Recording:9/30-10/30/2022 Sensor description:Dexcom  Results statistics:   CGM use % of time 57  Average and SD 141/50  Time in range  77%  % Time Above 180 17  % Time above 250 3  % Time Below target 2    Glycemic patterns summary: Hyperglycemia post breakfast, optimal BG's through the day  Hypoglycemic episodes occurred occasional post bolus   Overnight periods: trends down   IN-Pen report 9/28-10/01/2021  Average 141 Missed doses:  1 SD 52 TDD 44.4 units    DIABETIC COMPLICATIONS: Microvascular complications:  Neuropathy Denies: retinopathy, CKD Last eye exam: Completed 05/29/2020   Macrovascular complications:    Denies: CAD, PVD, CVA         HISTORY:  Past Medical History:  Past Medical History:  Diagnosis Date   Cerebellar ataxia ([place])    Diabetes mellitus without complication ([place])    Diabetic neuropathy ([place]) 12/09/2020   Foot fracture 2022   Gait abnormality 11/07/2018   Hyperlipidemia    Pulmonary embolism ([place])    Sleep apnea    Spinal stenosis    Past Surgical History:  Past Surgical History:  Procedure Laterality Date  ABDOMINAL HYSTERECTOMY     ADENOIDECTOMY     ANKLE SURGERY Left    BILATERAL CARPAL TUNNEL RELEASE     BREAST BIOPSY Left 2019   benign   ELBOW SURGERY     GASTRIC BYPASS  2014   HAND NEUROPLASTY     radio denervation  05/21/2020   TONSILLECTOMY     Social History:  reports that she has never smoked. She has never used smokeless tobacco. She reports that she does not drink alcohol and does not use drugs. Family History:  Family History  Problem Relation Age of Onset   Cervical cancer Mother    Ovarian cancer Mother    Dementia Father     Retinal detachment Father    Heart attack Father    Diabetes Father    Stroke Father    Hypertension Father    Ovarian cancer Sister    Arthritis Sister    Arthritis Sister    Arthritis Sister    Glaucoma Paternal Grandfather      HOME MEDICATIONS: Allergies as of 12/25/2020   No Known Allergies      Medication List        Accurate as of December 25, 2020 10:20 AM. If you have any questions, ask your nurse or doctor.          STOP taking these medications    MAGNESIUM GLYCINATE PO Stopped by: Dorita Sciara, MD       TAKE these medications    acetaminophen 325 MG tablet Commonly known as: TYLENOL Take 650 mg by mouth every 6 (six) hours as needed for moderate pain.   atorvastatin 80 MG tablet Commonly known as: LIPITOR Take 80 mg by mouth daily.   atorvastatin 40 MG tablet Commonly known as: LIPITOR Take 40 mg by mouth daily.   B-D UF III MINI PEN NEEDLES 31G X 5 MM Misc Generic drug: Insulin Pen Needle USE FOUR TIMES A DAY AS DIRECTED   Bariatric Fusion Chew Chew 2 tablets by mouth 2 (two) times daily.   CALCIUM CITRATE PO Take by mouth.   carboxymethylcellulose 0.5 % Soln Commonly known as: REFRESH PLUS Apply to eye.   Dexcom G6 Sensor Misc 1 each by Other route daily.   Dexcom G6 Transmitter Misc 1 each by Misc.(Non-Drug; Combo Route) route. Use as directed for continuous glucose monitoring. Reuse transmitter for 90 days then discard and replace.   gabapentin 100 MG capsule Commonly known as: NEURONTIN Take 1 capsule (100 mg total) by mouth 3 (three) times daily.   glucose blood test strip ASCENSIA CONTOUR NEXT ONE: Use 1 strip to check blood sugar daily   InPen 100-Blue-Novolog-Fiasp Devi Generic drug: injection device for insulin CONSULT MD FOR APP SETTINGS.   Insulin Syringe-Needle U-100 31G X 15/64" 0.5 ML Misc 1 Device by Does not apply route daily.   Microlet Lancets Misc 3 (three) times daily.   mycophenolate 500  MG tablet Commonly known as: CELLCEPT Take 2 tablets (1,000 mg total) by mouth 2 (two) times daily.   NovoLOG PenFill cartridge Generic drug: insulin aspart MAX DAILY DOSE OF 60 UNITS per correction scale   oxybutynin 5 MG tablet Commonly known as: DITROPAN TAKE ONE TABLET BY MOUTH TWICE A DAY   sertraline 50 MG tablet Commonly known as: ZOLOFT Take 100 mg by mouth daily.   tiZANidine 2 MG tablet Commonly known as: ZANAFLEX TAKE 1 TO 2 TABLETS BY MOUTH AT BEDTIME AS NEEDED FOR MUSCLE SPASMS   Tyler Aas  FlexTouch 100 UNIT/ML FlexTouch Pen Generic drug: insulin degludec Inject 16 Units into the skin daily.   Vitamin D3 75 MCG (3000 UT) Tabs Take 75 mcg by mouth daily.        PHYSICAL EXAM: VS: BP 140/80 (BP Location: Left Arm, Patient Position: Sitting, Cuff Size: Small)   Pulse 72   Ht '5\' 10"'  (1.778 m)   Wt 187 lb 6.4 oz (85 kg)   SpO2 99%   BMI 26.89 kg/m    EXAM: General: Pt appears well and is in NAD  Lungs: Clear with good BS bilat with no rales, rhonchi, or wheezes  Heart: Auscultation: RRR   Extremities:  BL LE: no pretibial edema   Mental Status: Judgment, insight: intact Mood and affect: no depression, anxiety, or agitation   DM Foot Exam 06/19/2020 The skin of the feet is intact without sores or ulcerations. The pedal pulses are 2+ on right and 2+ on left. The sensation is intact to a screening 5.07, 10 gram monofilament bilaterally     DATA REVIEWED:  Lab Results  Component Value Date   HGBA1C 6.3 (A) 12/25/2020   HGBA1C 6.7 (A) 06/19/2020   HGBA1C 6.9 (A) 02/18/2020   Lab Results  Component Value Date   MICROALBUR <0.7 06/19/2020   LDLCALC 79 06/19/2020   CREATININE 0.79 12/09/2020     Results for VENA, BASSINGER (MRN [id number]) as of 12/25/2020 12:46  Ref. Range 12/09/2020 09:44  Sodium Latest Ref Range: 134 - 144 mmol/L 143  Potassium Latest Ref Range: 3.5 - 5.2 mmol/L 4.5  Chloride Latest Ref Range: 96 - 106 mmol/L 106  CO2  Latest Ref Range: 20 - 29 mmol/L 23  Glucose Latest Ref Range: 70 - 99 mg/dL 142 (H)  BUN Latest Ref Range: 8 - 27 mg/dL 20  Creatinine Latest Ref Range: 0.57 - 1.00 mg/dL 0.79  Calcium Latest Ref Range: 8.7 - 10.3 mg/dL 9.5  BUN/Creatinine Ratio Latest Ref Range: 12 - 28  25  eGFR Latest Ref Range: >59 mL/min/1.73 81  Alkaline Phosphatase Latest Ref Range: 44 - 121 IU/L 71  Albumin Latest Ref Range: 3.8 - 4.8 g/dL 4.3  Albumin/Globulin Ratio Latest Ref Range: 1.2 - 2.2  2.2  AST Latest Ref Range: 0 - 40 IU/L 21  ALT Latest Ref Range: 0 - 32 IU/L 15  Total Protein Latest Ref Range: 6.0 - 8.5 g/dL 6.3  Total Bilirubin Latest Ref Range: 0.0 - 1.2 mg/dL 0.3     ASSESSMENT / PLAN / RECOMMENDATIONS:   1) Type 1 Diabetes Mellitus, Optimally  Controlled - Most recent A1c of 6.3 %. Goal A1c < 7.5 %.      - Pt is sensitive to insulin as well as carbohydrate which is not unusual with autoimmune diabetes, but also makes it a bit challenging to reach optimal glucose readings, another contributing factor is her hx of gastric bypass which causes variability in absorption . - For a while there her insulin regimen has been steady, but she is having more frequent hypoglycemic episodes specifically past dinnertime, she was also noted with tight BG's over the past couple weeks overnight, we will reduce prandial insulin as well as basal insulin as below - No overnight hypoglycemia     MEDICATIONS: Decrease Tresiba 15 units daily  Change Novolog 8 units with breakfast, 12 units with lunch  , and 8 units with  supper  Continue Sensitivity factor 60    EDUCATION / INSTRUCTIONS: BG monitoring instructions: Patient is  instructed to check her blood sugars 4 times a day with CGM caliberation. Call [facility] if: BG persistently < 70  I reviewed the Rule of 15 for the treatment of hypoglycemia in detail with the patient.     2) Diabetic complications:  Eye: Does not have known  diabetic retinopathy.  Neuro/ Feet: Does not have known diabetic peripheral neuropathy. Renal: Patient does not have known baseline CKD. She is not on an ACEI/ARB at present.       F/U in 6 months    Signed electronically by: Mack Guise, MD  [facility]  [address] [place], [place] [address] Phone: [phone] FAX: [phone]   CC: Lind Covert, [place] [address] Phone: [phone]  Fax: [phone]  Return to Endocrinology clinic as below: Future Appointments  Date Time Provider [place]  02/23/2021  9:30 AM Marzetta Board, DPM [facility]  05/21/2021  9:00 AM Clydell Hakim, RD [place] [facility]  06/09/2021 10:00 AM Marcial Pacas, MD [facility] None  06/15/2021  9:20 AM Courtney Heys, MD [facility] CPR

## 2021-01-02 ENCOUNTER — Encounter: Payer: Self-pay | Admitting: Family Medicine

## 2021-01-02 MED ORDER — TRIAMCINOLONE ACETONIDE 0.5 % EX OINT: 1.0000 "application " | TOPICAL_OINTMENT | Freq: Two times a day (BID) | CUTANEOUS | 0 refills | Status: DC

## 2021-01-21 DIAGNOSIS — E663 Overweight: Secondary | ICD-10-CM | POA: Diagnosis not present

## 2021-01-21 DIAGNOSIS — Z8679 Personal history of other diseases of the circulatory system: Secondary | ICD-10-CM | POA: Diagnosis not present

## 2021-01-21 DIAGNOSIS — R5383 Other fatigue: Secondary | ICD-10-CM | POA: Diagnosis not present

## 2021-01-21 DIAGNOSIS — G119 Hereditary ataxia, unspecified: Secondary | ICD-10-CM | POA: Diagnosis not present

## 2021-01-21 DIAGNOSIS — Z9884 Bariatric surgery status: Secondary | ICD-10-CM | POA: Diagnosis not present

## 2021-01-26 DIAGNOSIS — R5383 Other fatigue: Secondary | ICD-10-CM | POA: Diagnosis not present

## 2021-02-04 ENCOUNTER — Other Ambulatory Visit: Payer: Self-pay | Admitting: Family Medicine

## 2021-02-07 ENCOUNTER — Encounter: Payer: Self-pay | Admitting: Family Medicine

## 2021-02-09 ENCOUNTER — Other Ambulatory Visit: Payer: Self-pay | Admitting: Family Medicine

## 2021-02-12 ENCOUNTER — Ambulatory Visit (INDEPENDENT_AMBULATORY_CARE_PROVIDER_SITE_OTHER): Payer: Medicare Other | Admitting: Family Medicine

## 2021-02-12 ENCOUNTER — Other Ambulatory Visit: Payer: Self-pay

## 2021-02-12 VITALS — BP 184/72 | HR 87 | Ht 70.0 in | Wt 190.4 lb

## 2021-02-12 DIAGNOSIS — L819 Disorder of pigmentation, unspecified: Secondary | ICD-10-CM | POA: Diagnosis not present

## 2021-02-12 DIAGNOSIS — D0472 Carcinoma in situ of skin of left lower limb, including hip: Secondary | ICD-10-CM | POA: Diagnosis not present

## 2021-02-12 NOTE — Progress Notes (Signed)
   Procedure Note  Patient: Joyce Brown            Date of Birth: Jun 19, 1950           MRN: [id number]             Visit Date: 02/12/2021  Punch biopsy of left lower leg Consent was obtained from patient to perform a 3 mm punch biopsy of lesion on left lower leg. The area was cleansed with three alcohol pads and followed by one iodine swab. One alcohol pad was used to remove excess iodine. The area was anesthetized with lidocaine plus epinephrine, forming a weal of solution under the skin. The punch biopsy was performed by pressing the tool into the skin firmly with slow rotation. The specimen was then grasped with forceps and cut from the underlying skin with sterile scissors. The specimen was placed into a specimen container. Sterile gauze was held with pressure on the area for 2 minutes. After continued light bleeding, the gauze was held for another minute before the application of silver nitrate. After continued pressure and use of silver nitrate, the lesion stopped bleeding. Bacitracin ointment was applied to the area and a Bandaid was placed over the site. Patient was given wound care instructions and verbalized understanding of return precautions.  Mikal Plane, [place]

## 2021-02-12 NOTE — Patient Instructions (Signed)
It was great to meet you today!  Here's what we talked about:  We took a punch biopsy of the area on your left leg since our previous treatment did not work. We will send the specimen to a pathologist. We will follow up with you regarding the results. We have attached a wound care sheet to reference as your biopsy heals. If the area becomes red, painful, or has drainage, let us know.  Berneta Sages "Branden" Kolton Kienle, [place]

## 2021-02-12 NOTE — Progress Notes (Signed)
    SUBJECTIVE:   CHIEF COMPLAINT / HPI:   Joyce Brown (MRN: [id number]) is a 70 y.o. female with a history of dermatitis, seborrheic keratosis, lichen planus, and actinic keratoses of the face who presents for lower left leg biopsy.  Lesion of left lower leg Patient states she has had the lesion for longer than she can remember. She was seen in our office on 12/16/20 at which time she was trialed on a topical antifungal without resolution. She has also tried a topical steroid. Since this time, she states the lesion has not gotten bigger. It is not painful and has not drained or bled. She does not endorse other similar lesions.  OBJECTIVE:   BP (!) 184/72   Pulse 87   Ht 5\' 10"  (1.778 m)   Wt 190 lb 6.4 oz (86.4 kg)   SpO2 98%   BMI 27.32 kg/m    PHYSICAL EXAM  GEN: Well-developed, in NAD HEAD: NCAT CVS: Regular rate RESP: Breathing comfortably on RA PSY: Normal mood and affect, alert and oriented SKIN: 7 mm hyperpigmented, irregular macule with areas of varying coloration on the superior medial aspect of left lower leg           ASSESSMENT/PLAN:   No problem-specific Assessment & Plan notes found for this encounter.   Left lower leg lesion Patient's lesion that is persistent after trials of topical steroids and antifungals, in the context of history of multiple actinic keratoses, is somewhat concerning for squamous cell carcinoma. However, the location of the lesion and appearance would be less typical. Will perform punch biopsy of the lesion for further assessment. See separate procedure note for full details. Will follow up results.  [place]

## 2021-02-20 ENCOUNTER — Encounter: Payer: Self-pay | Admitting: Family Medicine

## 2021-02-23 ENCOUNTER — Ambulatory Visit (INDEPENDENT_AMBULATORY_CARE_PROVIDER_SITE_OTHER): Payer: Medicare Other | Admitting: Podiatry

## 2021-02-23 ENCOUNTER — Other Ambulatory Visit: Payer: Self-pay

## 2021-02-23 ENCOUNTER — Encounter: Payer: Self-pay | Admitting: Family Medicine

## 2021-02-23 ENCOUNTER — Encounter: Payer: Self-pay | Admitting: Podiatry

## 2021-02-23 DIAGNOSIS — M79674 Pain in right toe(s): Secondary | ICD-10-CM | POA: Diagnosis not present

## 2021-02-23 DIAGNOSIS — M79675 Pain in left toe(s): Secondary | ICD-10-CM

## 2021-02-23 DIAGNOSIS — B351 Tinea unguium: Secondary | ICD-10-CM | POA: Diagnosis not present

## 2021-02-23 DIAGNOSIS — E1042 Type 1 diabetes mellitus with diabetic polyneuropathy: Secondary | ICD-10-CM | POA: Diagnosis not present

## 2021-02-24 ENCOUNTER — Encounter: Payer: Self-pay | Admitting: Family Medicine

## 2021-02-24 ENCOUNTER — Encounter: Payer: Self-pay | Admitting: Internal Medicine

## 2021-02-24 DIAGNOSIS — E1042 Type 1 diabetes mellitus with diabetic polyneuropathy: Secondary | ICD-10-CM

## 2021-02-25 ENCOUNTER — Encounter: Payer: Self-pay | Admitting: Family Medicine

## 2021-02-25 MED ORDER — OXYBUTYNIN CHLORIDE 5 MG PO TABS: 10.0000 mg | ORAL_TABLET | Freq: Two times a day (BID) | ORAL | 2 refills | Status: DC

## 2021-02-25 MED ORDER — ATORVASTATIN CALCIUM 40 MG PO TABS: 80.0000 mg | ORAL_TABLET | Freq: Every day | ORAL | 3 refills | Status: DC

## 2021-02-25 NOTE — Addendum Note (Signed)
Addended by: Talbert Cage L on: 02/25/2021 04:04 PM   Modules accepted: Orders

## 2021-02-26 NOTE — Progress Notes (Signed)
°  Subjective:  Patient ID: Joyce Brown, female    DOB: 1950/09/16,  MRN: [id number]  Jeana Kersting presents to clinic today for at risk foot care with history of diabetic neuropathy and painful elongated mycotic toenails 1-5 bilaterally which are tender when wearing enclosed shoe gear. Pain is relieved with periodic professional debridement.  Patient states blood glucose was 133 mg/dl today.  Neuropathy is managed with gabapentin.  She also has h/o cerebellar ataxia.  Patient states her fracture has healed. She last saw Dr. Amalia Hailey on 12/22/2020.  PCP is Lind Covert, MD , and last visit was 02/12/2021.  No Known Allergies  Review of Systems: Negative except as noted in the HPI. Objective:   Constitutional Vickye Astorino is a pleasant 70 y.o. Caucasian female, WD, WN in NAD. AAO x 3.   Vascular CFT immediate b/l LE. Palpable DP/PT pulses b/l LE. Digital hair present b/l. Skin temperature gradient WNL b/l. No pain with calf compression b/l. No edema noted b/l. No cyanosis or clubbing noted b/l LE.  Neurologic Normal speech. Oriented to person, place, and time. Protective sensation intact 5/5 intact bilaterally with 10g monofilament b/l. Vibratory sensation intact b/l.  Dermatologic Pedal skin warm and supple b/l.  No open wounds b/l. No interdigital macerations. Toenails 1-5 b/l elongated, thickened, discolored with subungual debris. +Tenderness with dorsal palpation of nailplates.No hyperkeratotic nor porokeratotic lesions noted b/l.  Orthopedic: Muscle strength 5/5 to all lower extremity muscle groups bilaterally. No pain, crepitus or joint limitation noted with ROM bilateral LE. Hammertoe deformity noted 2-5 b/l. Utilizes motorized chair for mobility assistance.   Radiographs: None  Last A1c:  Hemoglobin A1C Latest Ref Rng & Units 12/25/2020 06/19/2020  HGBA1C 4.0 - 5.6 % 6.3(A) 6.7(A)  Some recent data might be hidden   Assessment:   1. Pain due to  onychomycosis of toenails of both feet   2. Diabetic polyneuropathy associated with type 1 diabetes mellitus ([place])    Plan:  Patient was evaluated and treated and all questions answered. Consent given for treatment as described below: -Continue foot and shoe inspections daily. Monitor blood glucose per PCP/Endocrinologist's recommendations. -Patient to continue soft, supportive shoe gear daily. Start procedure for diabetic shoes. Patient qualifies based on diagnoses. -Patient to schedule appointment with Pedorthist for diabetic shoe measurements. -Mycotic toenails 1-5 bilaterally were debrided in length and girth with sterile nail nippers and dremel without incident. -Patient/POA to call should there be question/concern in the interim.  Return in about 3 months (around 05/24/2021).  Marzetta Board, DPM

## 2021-02-27 ENCOUNTER — Telehealth: Payer: Self-pay

## 2021-02-27 NOTE — Telephone Encounter (Signed)
Received fax from pharmacy regarding PA request for atorvastatin 40 mg tablets. Per insurance plan, there is a quantity limit exception.   Please advise if patient can be provided with rx for atorvastatin 80 mg tablets.   Thanks.   Talbot Grumbling, RN

## 2021-03-02 ENCOUNTER — Encounter: Payer: Self-pay | Admitting: Family Medicine

## 2021-03-02 ENCOUNTER — Other Ambulatory Visit: Payer: Self-pay | Admitting: Family Medicine

## 2021-03-02 DIAGNOSIS — E1042 Type 1 diabetes mellitus with diabetic polyneuropathy: Secondary | ICD-10-CM

## 2021-03-02 MED ORDER — ATORVASTATIN CALCIUM 80 MG PO TABS: 80.0000 mg | ORAL_TABLET | Freq: Every day | ORAL | 3 refills | Status: DC

## 2021-03-05 ENCOUNTER — Ambulatory Visit (INDEPENDENT_AMBULATORY_CARE_PROVIDER_SITE_OTHER): Payer: Medicare Other | Admitting: Family Medicine

## 2021-03-05 ENCOUNTER — Other Ambulatory Visit: Payer: Self-pay

## 2021-03-05 VITALS — BP 160/68 | HR 83 | Wt 192.6 lb

## 2021-03-05 DIAGNOSIS — L0291 Cutaneous abscess, unspecified: Secondary | ICD-10-CM | POA: Insufficient documentation

## 2021-03-05 DIAGNOSIS — D0472 Carcinoma in situ of skin of left lower limb, including hip: Secondary | ICD-10-CM | POA: Diagnosis not present

## 2021-03-05 DIAGNOSIS — D099 Carcinoma in situ, unspecified: Secondary | ICD-10-CM | POA: Diagnosis not present

## 2021-03-05 MED ORDER — DOXYCYCLINE MONOHYDRATE 100 MG PO CAPS: 100.0000 mg | ORAL_CAPSULE | Freq: Two times a day (BID) | ORAL | 0 refills | Status: AC

## 2021-03-05 NOTE — Assessment & Plan Note (Addendum)
-  concern for abscess given nature of cavitation and minimal purulence, doxycycline prescribed  - presumably this abscess occurred after the punch bx. - Her minimal symptoms may be due to her immunocompromised medications? - Will need to follow closely for complete resolution of any signs of infection and any mass lesion that could represent a tumor.  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

## 2021-03-05 NOTE — Progress Notes (Addendum)
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 Denies any new concerns today, diagnosis discussed and options provided. Patient vocalizes that she does not mind which option and would like to defer to provider. Denies any complications or other concerns from prior visit.  Patient relates that after punch bx she felt a hard area around the site that has slowly improved  No fever or redness or pain or purulent discharge   OBJECTIVE:   BP (!) 160/68    Pulse 83    Wt 192 lb 9.6 oz (87.4 kg)    SpO2 98%    BMI 27.64 kg/m   General: Patient well-appearing, in no acute distress. Derm: about 1.5-2 cm circumscribed lesion noted along left leg with slight surrounding erythema that is firm to touch, noted to have about 2 cm cavitation during procedure   Procedure Note Patient agrees to electrodesiccation and curettage procedure, risks and benefits discussed. Consent signed. Materials gathered. Area of interest cleaned with alcohol swabs.Lidocaine with epinephrine injected to form a field block surrounding affected area. After area appropriately anesthetize curettage used to scrap within multiple directions and then electrodesiccation utilized. . An unexpected cavity beneath the lesion was uncovered measuring about 2 cm in depth.  There was minimal pus expressed.  The cavity was explored and small amount of firm yellowish rind type material was removed.   The cavity was packed with sterile packing material. Area covered in clean and dry dressing. Patient tolerated procedure well without complications. Care instructions provided to patient.   ASSESSMENT/PLAN:   Squamous cell carcinoma in situ -after discussed, patient agrees to electrodesiccation and curettage  Performed, patient tolerated procedure well without complications   Abscess -concern for abscess given nature of  cavitation and minimal purulence, doxycycline prescribed  - presumably this abscess occurred after the punch bx. - Her minimal symptoms may be due to her immunocompromised medications? - Will need to follow closely for complete resolution of any signs of infection and any mass lesion that could represent a tumor.  This may require imaging after signs of infection have resolved - Also will need to follow closely for complete resolution of the SCC in situ  -patient given appropriate instructions to maintain clear dressing -reassuringly patient seems to maintain afebrile status without signs of infection  -follow up next week to ensure healing appropriately without signs of infection, may continue antibiotics at next visit given immunocompromised status    The above note was created jointly by Drs Erin Hearing and Marisue Brooklyn, [place]

## 2021-03-05 NOTE — Assessment & Plan Note (Signed)
-  after discussed, patient agrees to electrodesiccation and curettage  Performed, patient tolerated procedure well without complications

## 2021-03-05 NOTE — Patient Instructions (Addendum)
It was great seeing you today!  Today we performed a procedure to remove the lesion. Please make sure to keep the area clean and dry, you may gradually remove an inch of packing every 2 days. Please take doxycycline twice daily for 10 days as we want to make sure an infection does not develop.   Please contact us if you notice any fevers, bleeding, color changes or other changes.   Please follow up at your next scheduled appointment on 12/28 at 2:10 pm, if anything arises between now and then, please don't hesitate to contact our office.   Thank you for allowing [place] to be a part of your medical care!  Thank you, Dr. Larae Grooms

## 2021-03-06 ENCOUNTER — Encounter: Payer: Self-pay | Admitting: Family Medicine

## 2021-03-06 ENCOUNTER — Telehealth: Payer: Self-pay | Admitting: Family Medicine

## 2021-03-06 NOTE — Telephone Encounter (Signed)
Called cell left VM to call or MyChart

## 2021-03-11 ENCOUNTER — Ambulatory Visit (INDEPENDENT_AMBULATORY_CARE_PROVIDER_SITE_OTHER): Payer: Medicare Other | Admitting: Family Medicine

## 2021-03-11 ENCOUNTER — Encounter: Payer: Self-pay | Admitting: Family Medicine

## 2021-03-11 ENCOUNTER — Other Ambulatory Visit: Payer: Self-pay

## 2021-03-11 VITALS — BP 157/72 | HR 69 | Ht 70.0 in

## 2021-03-11 DIAGNOSIS — L0291 Cutaneous abscess, unspecified: Secondary | ICD-10-CM

## 2021-03-11 MED ORDER — DOXYCYCLINE HYCLATE 100 MG PO TABS: 100.0000 mg | ORAL_TABLET | Freq: Two times a day (BID) | ORAL | 0 refills | Status: AC

## 2021-03-11 NOTE — Progress Notes (Signed)
° ° °  SUBJECTIVE:   CHIEF COMPLAINT / HPI:   Patient presents for wound follow up after diagnosed with squamous carcinoma in-situ with punch biopsy and s/p electrodessication and curettage.  Denies any concerns today. Denies bleeding, drainage, foul odor, fever, chills or other associated symptoms. She endorses compliance on her doxycycline, still continuing treatment as prescribed and tolerating medication well. Reports that the packing completely came out about 2 days ago after having gradually coming out.   OBJECTIVE:   BP (!) 157/72    Pulse 69    Ht 5\' 10"  (1.778 m)    SpO2 99%    BMI 27.64 kg/m   General: Patient well-appearing, in no acute distress. CV: RRR, no murmurs or gallops auscultated Resp: CTAB Derm: about 2 cm circumscribed lesion noted with cavitation of less than 0.5 cm in depth, no surrounding erythema or associated edema noted, no drainage      ASSESSMENT/PLAN:   Abscess -noted to demonstrate significant improvement, no signs of localized or systemic infection -given patient's immunocompromised state, will extend antibiotic treatment to a total of 15 day course, sent prescription for 5 more days of doxycycline 100 mg bid, med instructions shared with patient -discussed continued wound care  -return precautions and monitoring discussed -follow up in 2 weeks with PCP      Donney Dice, [place]

## 2021-03-11 NOTE — Assessment & Plan Note (Signed)
-  noted to demonstrate significant improvement, no signs of localized or systemic infection -given patient's immunocompromised state, will extend antibiotic treatment to a total of 15 day course, sent prescription for 5 more days of doxycycline 100 mg bid, med instructions shared with patient -discussed continued wound care  -return precautions and monitoring discussed -follow up in 2 weeks with PCP

## 2021-03-11 NOTE — Patient Instructions (Signed)
It was great seeing you today!  I am glad your lesion is healing very well. Please continue antibiotics and we will add 5 days of treatment for a total of 15 day antibiotic course.   Continue to change dressing daily and apply vaseline to the area. Make sure to monitor for skin changes such as swelling, drainage, bleeding and redness.   If you develop a fever, chills, redness, swelling or worsening symptoms then please call our office.  Please follow up at your next scheduled appointment in 2 weeks, if anything arises between now and then, please don't hesitate to contact our office.   Thank you for allowing [place] to be a part of your medical care!  Thank you, Dr. Larae Grooms

## 2021-03-19 ENCOUNTER — Other Ambulatory Visit (HOSPITAL_COMMUNITY): Payer: Self-pay

## 2021-03-19 ENCOUNTER — Other Ambulatory Visit: Payer: Self-pay | Admitting: Internal Medicine

## 2021-03-19 ENCOUNTER — Other Ambulatory Visit: Payer: Self-pay | Admitting: Family Medicine

## 2021-03-19 DIAGNOSIS — E109 Type 1 diabetes mellitus without complications: Secondary | ICD-10-CM

## 2021-03-24 ENCOUNTER — Encounter: Payer: Self-pay | Admitting: Family Medicine

## 2021-03-24 ENCOUNTER — Ambulatory Visit (INDEPENDENT_AMBULATORY_CARE_PROVIDER_SITE_OTHER): Payer: Medicare Other | Admitting: Family Medicine

## 2021-03-24 ENCOUNTER — Other Ambulatory Visit: Payer: Self-pay

## 2021-03-24 DIAGNOSIS — D099 Carcinoma in situ, unspecified: Secondary | ICD-10-CM

## 2021-03-24 DIAGNOSIS — L0291 Cutaneous abscess, unspecified: Secondary | ICD-10-CM

## 2021-03-24 DIAGNOSIS — Z8679 Personal history of other diseases of the circulatory system: Secondary | ICD-10-CM

## 2021-03-24 MED ORDER — 3 SERIES BP MONITOR/UPPER ARM DEVI: 0 refills | Status: DC

## 2021-03-24 MED ORDER — SERTRALINE HCL 100 MG PO TABS: 100.0000 mg | ORAL_TABLET | Freq: Every day | ORAL | 3 refills | Status: DC

## 2021-03-24 NOTE — Assessment & Plan Note (Signed)
Wound from [facility] healing.  Since did not fully treat will need to monitor very closely for complete resolution

## 2021-03-24 NOTE — Assessment & Plan Note (Signed)
Her home high readings seem to be due to her blood pressure cuff.  Recommend getting a upper arm one and monitoring

## 2021-03-24 NOTE — Progress Notes (Signed)
° ° °  SUBJECTIVE:   CHIEF COMPLAINT / HPI:   Abscess No increased pain, no pus.  Bleeds a little with dressing changes   + ANA ANA was positive on screen from another physician.  She has no Butterfly (malar) rash on cheeks, Rash on face, arms, neck, torso (discoid rash), Skin rashes that result from exposure to sunlight or ultraviolet light (photosensitivity), Mouth or nasal sores (ulcers), Joint swelling, Abnormalities in urine.    Hypertension Her home wrist cuff is quite high frequently.  No chest pain or shortness of breath or edema.  Taking all medications as per list   OBJECTIVE:   BP 134/74    Pulse 66    SpO2 100%   Wound with no pus.  Indurated area below open wound nontender measures approx 2 x 2 cm   On recheck her wrist cuff is approx 30 patients higher than our machine Heart - Regular rate and rhythm.  No murmurs, gallops or rubs.    Lungs:  Normal respiratory effort, chest expands symmetrically. Lungs are clear to auscultation, no crackles or wheezes.   ASSESSMENT/PLAN:   Abscess Improving now 4 days off antibiotics.  Will continue to monitor.    Squamous cell carcinoma in situ Wound from [facility] healing.  Since did not fully treat will need to monitor very closely for complete resolution  History of hypertension Her home high readings seem to be due to her blood pressure cuff.  Recommend getting a upper arm one and monitoring      Lind Covert, [place]

## 2021-03-24 NOTE — Assessment & Plan Note (Signed)
Improving now 4 days off antibiotics.  Will continue to monitor.

## 2021-03-24 NOTE — Patient Instructions (Addendum)
Good to see you today - Thank you for coming in  Things we discussed today:  Wound - let me know if any changes - increased pain or any pus or bleeding Should slowly improve over the next few months  Your goal blood pressure is less than 140/90.  Check your blood pressure several times a week.  If regularly higher than this please let me know - either with MyChart or leaving a phone message. Consider getting a arm cuff  Next visit please bring in your blood pressure cuff.      Come back to see me in 1 month to recheck your wound

## 2021-04-02 ENCOUNTER — Encounter: Payer: Self-pay | Admitting: Family Medicine

## 2021-04-06 ENCOUNTER — Other Ambulatory Visit: Payer: Self-pay | Admitting: Family Medicine

## 2021-04-06 DIAGNOSIS — N3941 Urge incontinence: Secondary | ICD-10-CM

## 2021-04-13 ENCOUNTER — Encounter: Payer: Self-pay | Admitting: Internal Medicine

## 2021-04-14 ENCOUNTER — Other Ambulatory Visit: Payer: Self-pay

## 2021-04-14 MED ORDER — INPEN 100-BLUE-NOVOLOG-FIASP DEVI: 0 refills | Status: DC

## 2021-04-14 NOTE — Telephone Encounter (Signed)
Script sent  

## 2021-04-21 ENCOUNTER — Other Ambulatory Visit: Payer: Self-pay

## 2021-04-21 ENCOUNTER — Encounter: Payer: Self-pay | Admitting: Family Medicine

## 2021-04-21 ENCOUNTER — Ambulatory Visit (INDEPENDENT_AMBULATORY_CARE_PROVIDER_SITE_OTHER): Payer: Medicare Other | Admitting: Family Medicine

## 2021-04-21 DIAGNOSIS — L0291 Cutaneous abscess, unspecified: Secondary | ICD-10-CM

## 2021-04-21 DIAGNOSIS — R350 Frequency of micturition: Secondary | ICD-10-CM

## 2021-04-21 MED ORDER — GABAPENTIN 100 MG PO CAPS: 100.0000 mg | ORAL_CAPSULE | Freq: Three times a day (TID) | ORAL | 6 refills | Status: DC

## 2021-04-21 MED ORDER — TIZANIDINE HCL 2 MG PO TABS: ORAL_TABLET | ORAL | 11 refills | Status: DC

## 2021-04-21 NOTE — Patient Instructions (Addendum)
Good to see you today - Thank you for coming in  Things we discussed today:  Wound - use vaseline daily.  Let me know if any changes  Your goal blood pressure is less than 140/90.  Check your blood pressure several times a week.  If regularly higher than this please let me know - either with MyChart or leaving a phone message. Next visit please bring in your blood pressure cuff.    Please always bring your medication bottles  Come back to see me in 4-6 weeks to recheck the wound

## 2021-04-21 NOTE — Assessment & Plan Note (Signed)
Improving.  Given history of SCC in situ over lesion need to continue to monitor closely.

## 2021-04-21 NOTE — Assessment & Plan Note (Signed)
Referral placed for urology follow up

## 2021-04-21 NOTE — Progress Notes (Signed)
° ° °  SUBJECTIVE:   CHIEF COMPLAINT / HPI:   Abscess Seems to be improving.  Fully healed over without discharge or pain or redness.  She does feel a fullness underneath the lesion but is not growing.  No fevers or streaking  BP  Home readings in 130s/70s.  No lightheadness or chest pain   PERTINENT  PMH / PSH: her diabetes control in improving  OBJECTIVE:   BP 139/74    Pulse 81    Wt 194 lb 3.2 oz (88.1 kg)    SpO2 100%    BMI 27.86 kg/m    Nontender.  A vague induration under the dermis without fluctuance or discharge     ASSESSMENT/PLAN:   Abscess Improving.  Given history of SCC in situ over lesion need to continue to monitor closely.    Urinary frequency Referral placed for urology follow up      Lind Covert, [place]

## 2021-04-29 ENCOUNTER — Encounter: Payer: Self-pay | Admitting: Family Medicine

## 2021-04-30 ENCOUNTER — Ambulatory Visit (INDEPENDENT_AMBULATORY_CARE_PROVIDER_SITE_OTHER): Payer: Medicare Other | Admitting: Family Medicine

## 2021-04-30 ENCOUNTER — Other Ambulatory Visit: Payer: Self-pay

## 2021-04-30 ENCOUNTER — Encounter: Payer: Self-pay | Admitting: Family Medicine

## 2021-04-30 VITALS — BP 138/72 | HR 85 | Wt 187.0 lb

## 2021-04-30 DIAGNOSIS — J029 Acute pharyngitis, unspecified: Secondary | ICD-10-CM | POA: Diagnosis not present

## 2021-04-30 DIAGNOSIS — R509 Fever, unspecified: Secondary | ICD-10-CM

## 2021-04-30 DIAGNOSIS — R059 Cough, unspecified: Secondary | ICD-10-CM

## 2021-04-30 LAB — POCT RAPID STREP A (OFFICE): Rapid Strep A Screen: NEGATIVE

## 2021-04-30 MED ORDER — VALACYCLOVIR HCL 1 G PO TABS: 1000.0000 mg | ORAL_TABLET | Freq: Two times a day (BID) | ORAL | 0 refills | Status: AC

## 2021-04-30 MED ORDER — AZITHROMYCIN 250 MG PO TABS: ORAL_TABLET | ORAL | 0 refills | Status: DC

## 2021-04-30 NOTE — Progress Notes (Signed)
° ° °  SUBJECTIVE:   CHIEF COMPLAINT / HPI:   Patient with severe sore throat beginning 2/10.  Some difficulty swallowing.  No fever or cough.  Feels ill.  Not improving.  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  No known infectious exposure.   OBJECTIVE:   BP 138/72    Pulse 85    Wt 187 lb (84.8 kg)    SpO2 97%    BMI 26.83 kg/m   TMs normal Throat: Brown coating of post 1/2 of tongue.  Impressive multiple ulcerations on the post 1/3 of pallet.  No ulcerations on anterior 1/3. Neck no sig adenopathy Lungs clear  Rapid Strep neg in office  ASSESSMENT/PLAN:   Pharyngitis Impressive pharyngitis is immunocompromised patient.  As such, I am going to keep my differential broad (even though most likely viral)  Reviewed unusual causes of pharyngitis.  Bacterial and atypical are covered by macrolides (Rx with azithro).  I will also treat for herpes even though it typically affects anterior rather than post 1/3.  COVID is listed as a common cause of pharyngitis.  Will test.     Zenia Resides, MD [place]

## 2021-04-30 NOTE — Patient Instructions (Signed)
I am impressed by how severe your sore throat looks. I am treating for some weird infections that you may be susceptible to because of the cellcept.   You can look at MyChart this weekend for results.  If the tests come back positive, you can stop both prescriptions.

## 2021-04-30 NOTE — Assessment & Plan Note (Signed)
Impressive pharyngitis is immunocompromised patient.  As such, I am going to keep my differential broad (even though most likely viral)  Reviewed unusual causes of pharyngitis.  Bacterial and atypical are covered by macrolides (Rx with azithro).  I will also treat for herpes even though it typically affects anterior rather than post 1/3.  COVID is listed as a common cause of pharyngitis.  Will test.

## 2021-05-02 LAB — COVID-19, FLU A+B AND RSV
Influenza A, NAA: NOT DETECTED
Influenza B, NAA: NOT DETECTED
RSV, NAA: NOT DETECTED
SARS-CoV-2, NAA: NOT DETECTED

## 2021-05-04 ENCOUNTER — Encounter: Payer: Self-pay | Admitting: Internal Medicine

## 2021-05-05 ENCOUNTER — Encounter: Payer: Self-pay | Admitting: Family Medicine

## 2021-05-05 DIAGNOSIS — J029 Acute pharyngitis, unspecified: Secondary | ICD-10-CM

## 2021-05-06 ENCOUNTER — Other Ambulatory Visit: Payer: Self-pay | Admitting: Podiatry

## 2021-05-06 MED ORDER — NYSTATIN 100000 UNIT/ML MT SUSP: 5.0000 mL | Freq: Four times a day (QID) | OROMUCOSAL | 0 refills | Status: DC

## 2021-05-06 NOTE — Telephone Encounter (Signed)
Please advise 

## 2021-05-07 ENCOUNTER — Encounter: Payer: Self-pay | Admitting: Family Medicine

## 2021-05-07 ENCOUNTER — Ambulatory Visit (INDEPENDENT_AMBULATORY_CARE_PROVIDER_SITE_OTHER): Payer: Medicare Other | Admitting: Family Medicine

## 2021-05-07 ENCOUNTER — Other Ambulatory Visit: Payer: Self-pay

## 2021-05-07 DIAGNOSIS — J029 Acute pharyngitis, unspecified: Secondary | ICD-10-CM

## 2021-05-07 NOTE — Progress Notes (Signed)
° ° °  SUBJECTIVE:   CHIEF COMPLAINT / HPI:   FU pharyngitis.  See my recent visit and phone note from yesterday.  It seems that she likely did have thrush that followed on the heals of her acute pharyngitis.  She started the nystatin yesterday and feels noticably improve.  Minor sore throat.  Less white coating.  Minor jaw stiffness    OBJECTIVE:   BP 140/69    Pulse 84    Ht 5\' 10"  (1.778 m)    Wt 184 lb 9.6 oz (83.7 kg)    SpO2 97%    BMI 26.49 kg/m   Throat minor white thick mucous.  Minor injection.  The impressive ulcerations have resolved. Opens mouth normally Neck no adenopathy Lungs clear  ASSESSMENT/PLAN:   Pharyngitis Likely thrush superimposed upon now resolving pharyngitis.  Continue current meds.  No additional treatment.       Zenia Resides, MD [place]

## 2021-05-07 NOTE — Patient Instructions (Signed)
I am delighted that you are finally improving.  Finish off the meds.  I trust you will not have another relapse. I also suspect the jaw tightness will loosen up as the infection fully resolves.

## 2021-05-07 NOTE — Assessment & Plan Note (Signed)
Likely thrush superimposed upon now resolving pharyngitis.  Continue current meds.  No additional treatment.

## 2021-05-10 ENCOUNTER — Encounter: Payer: Self-pay | Admitting: Internal Medicine

## 2021-05-18 ENCOUNTER — Other Ambulatory Visit: Payer: Self-pay

## 2021-05-18 ENCOUNTER — Ambulatory Visit (INDEPENDENT_AMBULATORY_CARE_PROVIDER_SITE_OTHER): Payer: Medicare Other | Admitting: Family Medicine

## 2021-05-18 ENCOUNTER — Encounter: Payer: Self-pay | Admitting: Family Medicine

## 2021-05-18 DIAGNOSIS — J029 Acute pharyngitis, unspecified: Secondary | ICD-10-CM

## 2021-05-18 DIAGNOSIS — D099 Carcinoma in situ, unspecified: Secondary | ICD-10-CM | POA: Diagnosis not present

## 2021-05-18 DIAGNOSIS — F4323 Adjustment disorder with mixed anxiety and depressed mood: Secondary | ICD-10-CM | POA: Diagnosis not present

## 2021-05-18 NOTE — Progress Notes (Addendum)
? ? ?  SUBJECTIVE:  ? ?CHIEF COMPLAINT / HPI:  ? ?Skin Lesion SCC in situ ?Feels this is healing well.  Induration under the lesion is shrinking.  No pain or discharge ? ? ?Dry Mouth Pharyngitis ?Her throat is better from a pain standpoint but feels fuzzy and stings in the back R with very dry mouth and thick saliva.  No problems eating or food getting stuck.  Continues Ditropan 10 mg twice a day.   No fever or swollen glands ? ?Urinary incontinence ?Has appointment with urology this month.  Ditropan at current dose is helping.  No dysuria ? ? ? ?OBJECTIVE:  ? ?BP 132/60   Pulse 68   Wt 186 lb (84.4 kg)   BMI 26.69 kg/m?   ?No distress ?Mouth - thick whitish saliva in back of mouth throat and around teeth ?Throat - mildly diffusely red without exudate.  No specific lesion seen ?Neck - no adenopathy ?L leg - healing abscess site.  Minimal palpable induration under lesion ? ? ? ? ?ASSESSMENT/PLAN:  ? ?Squamous cell carcinoma in situ ?With abscess after initial bx.  This seems to be healing well.  Will continue to follow for complete resolution.   ? ?Pharyngitis ?Worsened after initial improvement for thrush.  I think likely partially due to dry moth worsened by ditropan.  See after visit summary for suggestion of nasal saline and artificial saliva.  See if can decrease ditropan dose until sees urology this month.  To call if not improving  ? ?Adjustment disorder with mixed anxiety and depressed mood ?She feels her mood is stable.  Just irritated from her continuing sore throat  ?  ? ? ?Lind Covert, MD ?[place]  ?

## 2021-05-18 NOTE — Assessment & Plan Note (Signed)
Worsened after initial improvement for thrush.  I think likely partially due to dry moth worsened by ditropan.  See after visit summary for suggestion of nasal saline and artificial saliva.  See if can decrease ditropan dose until sees urology this month.  To call if not improving  ?

## 2021-05-18 NOTE — Assessment & Plan Note (Signed)
She feels her mood is stable.  Just irritated from her continuing sore throat  ?

## 2021-05-18 NOTE — Patient Instructions (Signed)
Good to see you today - Thank you for coming in ? ?Things we discussed today: ? ?Leg Lesion - keep it moist with vaseline ? ?Dry mouth ?Nasal saline - 2 sprays every 2-4 hours ?Saliva substitutes  ?If not better in one week or if worsening call me ? ?Caphosol, NeutraSal: Swish and spit; 2-10 doses/day ?Oasis spray: 1-2 sprays PRN; not to exceed 60 sprays/day ?Oasis mouthwash: Rinse mouth with ~30 mL q12hr PRN; do not swallow ?Aquoral: 2 sprays PO q6-8hr PRN ?Entertainer's secret: Spray PRN ?Mouth Kote spray: Spray 3-5 times; swish for 8-10 seconds and spit or swallow PRN ?Biotene: Apply 0.5 inch length onto tongue and spread evenly; repeat as often as needed ?Numoisyn liquid: Use 2 mL PRN ?Numoisyn lozenges: Dissolve 1 lozenge slowly; not to exceed 16 lozenges/day ?SalivaSure: Dissolve 1 lozenge in mouth PRN; 1 lozenge/hr recommended ?XyliMelts: Apply 2 discs before bed, 1 on each side of mouth, in lower or upper part of cheek; during the day, use as needed; swallow as it slowly dissolves (the tan, dimpled side will adhere to teeth or gums); before bedtime, use 2 discs, placing one on each side of the mouth ? ? ?Please always bring your medication bottles ? ?Come back to see me in 3 months  ?

## 2021-05-18 NOTE — Assessment & Plan Note (Signed)
With abscess after initial bx.  This seems to be healing well.  Will continue to follow for complete resolution.   ?

## 2021-05-20 ENCOUNTER — Other Ambulatory Visit: Payer: Self-pay | Admitting: Internal Medicine

## 2021-05-20 DIAGNOSIS — E1065 Type 1 diabetes mellitus with hyperglycemia: Secondary | ICD-10-CM

## 2021-05-21 ENCOUNTER — Encounter: Payer: Medicare Other | Attending: Internal Medicine | Admitting: Dietician

## 2021-05-21 ENCOUNTER — Encounter: Payer: Self-pay | Admitting: Dietician

## 2021-05-21 ENCOUNTER — Other Ambulatory Visit: Payer: Self-pay

## 2021-05-21 DIAGNOSIS — E1042 Type 1 diabetes mellitus with diabetic polyneuropathy: Secondary | ICD-10-CM | POA: Insufficient documentation

## 2021-05-21 DIAGNOSIS — Z20822 Contact with and (suspected) exposure to covid-19: Secondary | ICD-10-CM | POA: Diagnosis not present

## 2021-05-21 NOTE — Progress Notes (Signed)
Diabetes Self-Management Education  Visit Type:  Follow-up  Appt. Start Time: 0910 Appt. End Time: 0945  05/21/2021  Joyce Brown, identified by name and date of birth, is a 71 y.o. female with a diagnosis of Diabetes:  .   ASSESSMENT Patient is here today alone.  She was last seen by this RD on 11/21/2020. She has been sick for a couple of weeks at the end of February. (Cold sore and severe sore throat).  Complains of extremely dry throat now treated with Biotene.  Fights high glucose at times related to low blood glucose treatment. InPen and Dexcom reports reviewed. Average Glucose 141 Time in Range 69% with lows 7%, severe lows 2%, very high 6% and high 14%  She is concerned that her weight has increased to 194 lbs 05/2021 and her goal is < 180. She is enjoying oatmeal in the morning as this helps her feel full until lunch. She is not missing her insulin. Food prices affect her diet.  She has listened to a [employer] podcast regarding insulin cost and forwarded her an email today that discussed medicare and the $35 insulin cap.    She has been wheeling around her apartemnt complex or to the grocery store most days of the week.  Will go to the pool when it gets warm again.  Stopped going to the aquatic center due to cost and transportation issues.  Discussed that the [facility] may be an issue if transportation issues have improved.   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   History includes:Type 1 Diabetes since 2007, asthma, cerebellar ataxia, HTN, HLD, OSA on C-pap and sleep study Julyl 6. Noted swallow evaluation with recommendation of a Dysphagia 3 diet. Dexcom G-6 since 07/11/18.  Inpen  Medication includes Tresiba 15 units q HS and Novolog 8 units before breakfast,12 units before lunch and 8 units before dinner and 1 unit before a snack.   She is not aiming to give this 30 minutes prior to the meal which has improved her  blood sugar control. A1C 6.3% 12/25/2020   Weight hx: 194 lbs 05/2021 per patient 185 lbs per patient 11/21/2020 173 lbs 05/15/2020 178 lbs 02/18/2020   Patient lives alone.  She uses a wheel chair.  She is doing her own grocery shopping.  She uses [employer] [facility] for medical appointments but is able to wheel herself to the grocery store.  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 She moved here from [place] last year to be closer to one of her sister's who lives in [place]. The pool at her apartment is to reopen outdoors.  Patient enjoys walking in the pool.   She last saw an RD in [place] about 11 months prior to my first visit      Diabetes Self-Management Education - 05/21/21 1110       Psychosocial Assessment   Patient Belief/Attitude about Diabetes Motivated to manage diabetes    Self-care barriers Debilitated state due to current medical condition    Self-management support Doctor's office;CDE visits    Patient Concerns Glycemic Control    Learning Readiness Ready      Pre-Education Assessment   Patient understands the diabetes disease and treatment process. Demonstrates understanding / competency    Patient understands incorporating nutritional management into lifestyle. Needs Review    Patient undertands incorporating physical activity into lifestyle. Demonstrates understanding / competency    Patient understands  using medications safely. Demonstrates understanding / competency    Patient understands monitoring blood glucose, interpreting and using results Demonstrates understanding / competency    Patient understands prevention, detection, and treatment of acute complications. Needs Review    Patient understands prevention, detection, and treatment of chronic complications. Demonstrates understanding / competency    Patient understands how to develop strategies to address psychosocial issues. Demonstrates understanding  / competency    Patient understands how to develop strategies to promote health/change behavior. Needs Review      Complications   Last HgB A1C per patient/outside source 6.3 %    How often do you check your blood sugar? > 4 times/day    Fasting Blood glucose range (mg/dL) 70-129    Postprandial Blood glucose range (mg/dL) 130-179;180-200;>200    Number of hypoglycemic episodes per month 7    Can you tell when your blood sugar is low? Yes    What do you do if your blood sugar is low? glucose tabs and PB and graham crackers      Dietary Intake   Breakfast regular old fashioned oats, 2 tsp brown sugar, 2 T half and half    Lunch rotissorie chicken soup with spinach, carrots, couscous, and greek yogurt    Snack (afternoon) occasional graham crackers and PB if low    Dinner rotissorie chicken, steamed vegetables, medium apple    Snack (evening) none    Beverage(s) water      Exercise   Exercise Type Light (walking / raking leaves)    How many days per week to you exercise? 6    How many minutes per day do you exercise? 30    Total minutes per week of exercise 180      Patient Education   Previous Diabetes Education Yes (please comment)   11/2020   Nutrition management  Meal options for control of blood glucose level and chronic complications.;Other (comment)   carbohydrate consistency   Physical activity and exercise  Other (comment)   encouraged continued consistency   Medications Reviewed patients medication for diabetes, action, purpose, timing of dose and side effects.    Monitoring Taught/evaluated SMBG meter.    Acute complications Taught treatment of hypoglycemia - the 15 rule.    Psychosocial adjustment Worked with patient to identify barriers to care and solutions      Individualized Goals (developed by patient)   Nutrition Follow meal plan discussed    Physical Activity Exercise 5-7 days per week;30 minutes per day    Medications take my medication as prescribed     Monitoring  test my blood glucose as discussed    Reducing Risk examine blood glucose patterns;increase portions of healthy fats      Patient Self-Evaluation of Goals - Patient rates self as meeting previously set goals (% of time)   Nutrition >75%    Physical Activity >75%    Medications >75%    Monitoring >75%    Problem Solving >75%    Reducing Risk >75%    Health Coping >75%      Post-Education Assessment   Patient understands the diabetes disease and treatment process. Demonstrates understanding / competency    Patient understands incorporating nutritional management into lifestyle. Needs Review    Patient undertands incorporating physical activity into lifestyle. Demonstrates understanding / competency    Patient understands using medications safely. Demonstrates understanding / competency    Patient understands monitoring blood glucose, interpreting and using results Demonstrates understanding / competency  Patient understands prevention, detection, and treatment of acute complications. Demonstrates understanding / competency    Patient understands prevention, detection, and treatment of chronic complications. Demonstrates understanding / competency    Patient understands how to develop strategies to address psychosocial issues. Demonstrates understanding / competency    Patient understands how to develop strategies to promote health/change behavior. Needs Review      Outcomes   Program Status Not Completed      Subsequent Visit   Since your last visit have you continued or begun to take your medications as prescribed? Yes    Since your last visit have you experienced any weight changes? Gain    Weight Gain (lbs) 9    Since your last visit, are you checking your blood glucose at least once a day? Yes             Learning Objective:  Patient will have a greater understanding of diabetes self-management. Patient education plan is to attend individual and/or group  sessions per assessed needs and concerns.   Plan:   Patient Instructions  Recommend using oat milk in your oatmeal rather than half and half  Maintain your carbohydrate at about 30-45 grams per meal. Add a small handful of nuts (walnuts etc) to your oatmeal or add a boiled egg on the side.  Simple meal planning - aim to have a protein with each meal             Sheet pan meal (chicken, vegetables, potatoes)              Bean soup with salad             Salad with boiled egg or cottage cheese, fruit             Pasta salad, cottage cheese or cheese or boiled egg, fruit             Tuna sandwich on spinach, German dark wheat bread, baby    Salad with loose greens kale, chick peas, blueberries, yogurt on side  Great job on being more active!  Continue to wheel around your apartment complex etc.  Head to the pool when it gets warmer  [facility] has a pool to consider  Consider going to your apartment fitness center  Sit and be fit online  Resources: http://schroeder.biz/ (meal planning and recipes) [place].com (recipes) http://cook-fox.com/  http://www.fernandez-meyer.[email or web address]   Expected Outcomes:  Demonstrated interest in learning. Expect positive outcomes  Education material provided:   If problems or questions, patient to contact team via:  Phone  Future DSME appointment: - 6 months

## 2021-05-21 NOTE — Patient Instructions (Addendum)
Recommend using oat milk in your oatmeal rather than half and half ? ?Maintain your carbohydrate at about 30-45 grams per meal. ?Add a small handful of nuts (walnuts etc) to your oatmeal or add a boiled egg on the side. ? ?Simple meal planning - aim to have a protein with each meal ?            Sheet pan meal (chicken, vegetables, potatoes)  ?            Bean soup with salad ?            Salad with boiled egg or cottage cheese, fruit ?            Pasta salad, cottage cheese or cheese or boiled egg, fruit ?            Tuna sandwich on spinach, German dark wheat bread, baby   ? Salad with loose greens kale, chick peas, blueberries, yogurt on side ? ?Great job on being more active! ? Continue to wheel around your apartment complex etc. ? Head to the pool when it gets warmer ? [place] [facility] has a pool to consider ? Consider going to your apartment fitness center ? Sit and be fit online ? ?Resources: ?http://schroeder.biz/ (meal planning and recipes) ?DiabetesSelfManagement.com (recipes) ?http://cook-fox.com/  ?[email or web address]-[email or web address] ?

## 2021-05-25 ENCOUNTER — Encounter: Payer: Self-pay | Admitting: Family Medicine

## 2021-05-28 ENCOUNTER — Encounter: Payer: Self-pay | Admitting: Neurology

## 2021-05-28 ENCOUNTER — Ambulatory Visit (INDEPENDENT_AMBULATORY_CARE_PROVIDER_SITE_OTHER): Payer: Medicare Other | Admitting: Neurology

## 2021-05-28 ENCOUNTER — Telehealth: Payer: Self-pay | Admitting: Neurology

## 2021-05-28 ENCOUNTER — Other Ambulatory Visit: Payer: Self-pay

## 2021-05-28 VITALS — BP 122/65 | HR 68

## 2021-05-28 DIAGNOSIS — R269 Unspecified abnormalities of gait and mobility: Secondary | ICD-10-CM | POA: Diagnosis not present

## 2021-05-28 DIAGNOSIS — G119 Hereditary ataxia, unspecified: Secondary | ICD-10-CM | POA: Diagnosis not present

## 2021-05-28 DIAGNOSIS — E1142 Type 2 diabetes mellitus with diabetic polyneuropathy: Secondary | ICD-10-CM

## 2021-05-28 MED ORDER — MYCOPHENOLATE MOFETIL 500 MG PO TABS: 1000.0000 mg | ORAL_TABLET | Freq: Two times a day (BID) | ORAL | 3 refills | Status: DC

## 2021-05-28 NOTE — Progress Notes (Signed)
? ?Chief Complaint  ?Patient presents with  ? Consult  ?  Room 17 - alone. Cerebellar ataxia. Transitioning care from Dr. Jannifer Franklin. She has continued Cellcept '500mg'$ , two tablets BID.   ? ? ? ? ?ASSESSMENT AND PLAN ? ?Joyce Brown is a 71 y.o. 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 Symptoms has plateaued around 2007 after 2 years of rapid decline, ? No significant worsening taking CellCept 1000 mg twice a day, refilled her prescription, ? Laboratory evaluations, ? Discussed with patient, will proceed with MRI of the brain to see the progression of her cerebellar atrophy, ? ?Worsening urinary incontinence, ? Brisk reflex, ? MRI of cervical spine to rule out cervical spondylitic myelopathy, ? ?Lumbar spondylosis, chronic low back pain ? ? ?DIAGNOSTIC DATA (LABS, IMAGING, TESTING) ?- I reviewed patient records, labs, notes, testing and imaging myself where available. ? ?In 2023, A1C 6.3, ENA RNP antibody 1.8, normal CMP, creat 0.8, CBC, angiotensin-converting enzyme, protein electrophoresis, Lyme titer, B12, TSH, ? ? ?MEDICAL HISTORY: ? ?Joyce Brown is a 71 year old female, previous patient of Dr. Jannifer Franklin, for evaluation of ataxia, her primary care physician is Dr. Erin Hearing, Jeb Levering, MD  ? ?I reviewed and summarized the referring note.  Past medical history  ?Hyperlipidemia ?Diabetes ?Depression ? ?Patient was seen by Dr. Jannifer Franklin since October 2019, after she moved from [place] to [place] to be close to her sister. ? 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 ?She began to have problem in 2004 around age 58, she used to be very active, ride horses, taking care of horses, but she began to notice gradual onset gait abnormality, rapid  progression over 71-monthspan, ? 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 ?Her symptoms has been plateaued since 2007, there was no significant up-and-down since then, she now has mild dysarthria, but no dysphagia, rely on her wheelchair, walking with her leg sitting in wheelchair, mild disc Machi of upper extremity, denies significant vision problem ? ?She denied family history of similar issues, ? ?She also have severe incontinence, urinary urgency, incontinence, taking oxybutynin 5 mg 1 and half tablets twice a day, has extreme dry mouth, seeing urologist regularly ? ?She lives alone in an apartment with 2 cats, by HKristopher Oppenheim she use her wheelchair left walking to the [employer] grocery shopping, able to cook simple microwave meal, she denies memory loss, she had severe low back pain, much improved with radiofrequency treatment by Dr. NErnestina Patches? ?Since she was seen by Dr. WJannifer Franklinin 2019, her 2 weekly IVIG was stopped, maintained on CellCept 500 mg 2 tablets twice a day, she did not notice any significant difference, patient initially was also on diazepam, but she denies significant muscle stiffness, ? ?Laboratory evaluations in 2022 showed normal CBC, CMP normal creatinine 0.79 with exception of mild elevated glucose 142, negative Lyme titer, protein electrophoresis, angiotensin-converting enzyme, positive ANA, low titer ENA RNP antibody, normal B12, ? ?She has insulin-dependent diabetes, previously was not under good control A1c  up to 9.7 in October 2020, now has much improved A1c was 6.15 December 2020. ?Personally reviewed MRI lumbar Jan 2022 ?Multilevel spondylosis. Lumbar dextrocurvature and slight reversal ?of lordosis. ?  ?Severe  left L1-2 and moderate right L4-5 neural foraminal narrowing. ?  ?Mild spinal canal narrowing at the L1-3, L4-5 levels. ?  ?Mild left L2-3, bilateral L3-4, left L4-5 and bilateral L5-S1 neural ?foraminal narrowing. ? ?CT head in September 2019, no acute abnormality, evidence of cerebellar atrophy ? ? ?PHYSICAL EXAM: ?  ?Vitals:  ? 05/28/21 0915  ?BP: 122/65  ?Pulse: 68  ? ?Not recorded ?  ? ? ?There is no height or weight on file to calculate BMI. ? ?PHYSICAL EXAMNIATION: ? ?Gen: NAD, conversant, well nourised, well groomed                     ?Cardiovascular: Regular rate rhythm, no peripheral edema, warm, nontender. ?Eyes: Conjunctivae clear without exudates or hemorrhage ?Neck: Supple, no carotid bruits. ?Pulmonary: Clear to auscultation bilaterally  ? ?NEUROLOGICAL EXAM: ? ?MENTAL STATUS: ?Speech: Mild slow slurred scanned speech, ?Cognition: ?    Orientation to time, place and person ?    Normal recent and remote memory ?    Normal Attention span and concentration ?    Normal Language, naming, repeating,spontaneous speech ?    Fund of knowledge ?  ?CRANIAL NERVES: ?CN II: Visual fields are full to confrontation. Pupils are round equal and briskly reactive to light. ?CN III, IV, VI: extraocular movement are normal. No ptosis. ?CN V: Facial sensation is intact to light touch ?CN VII: Face is symmetric with normal eye closure  ?CN VIII: Hearing is normal to causal conversation. ?CN IX, X: Phonation is normal. ?CN XI: Head turning and shoulder shrug are intact ? ?MOTOR: No muscle weakness ? ?REFLEXES: ?Reflexes are 2 and symmetric at the biceps, triceps, knees, and ankles. Plantar responses are flexor. ? ?SENSORY: ?Intact to light touch, pinprick and vibratory sensation are intact in fingers and toes. ? ?COORDINATION: Mild finger-to-nose dysmetria, symmetric, severe heel-to-shin dysmetria. ? ?GAIT/STANCE: Deferred ? ?REVIEW OF SYSTEMS:  ?Full 14 system review of systems performed and notable only for as above ?All  other review of systems were negative. ? ? ?ALLERGIES: ?No Known Allergies ? ?HOME MEDICATIONS: ?Current Outpatient Medications  ?Medication Sig Dispense Refill  ? acetaminophen (TYLENOL) 325 MG tablet Take 650 mg by mouth every 6 (six) hours as needed for moderate pain.     ? atorvastatin (LIPITOR) 80 MG tablet Take 1 tablet (80 mg total) by mouth daily. 90 tablet 3  ? Blood Pressure Monitoring (3 SERIES BP MONITOR/UPPER ARM) DEVI Take your blood pressure several times a week when relaxed 1 each 0  ? Calcium Citrate 150 MG CAPS Take 300 mg by mouth in the morning and at bedtime.    ? carboxymethylcellulose (REFRESH PLUS) 0.5 % SOLN Apply to eye.    ? Cholecalciferol (VITAMIN D3) 75 MCG (3000 UT) TABS Take 75 mcg by mouth daily.     ? Continuous Blood Gluc Sensor (DEXCOM G6 SENSOR) MISC 1 each by Other route daily.     ? Continuous Blood Gluc Transmit (DEXCOM G6 TRANSMITTER) MISC 1 each by Misc.(Non-Drug; Combo Route) route. Use as directed for continuous glucose monitoring. Reuse transmitter for 90 days then discard and replace.    ? gabapentin (NEURONTIN) 100 MG capsule TAKE ONE CAPSULE BY MOUTH THREE TIMES A DAY 90 capsule 0  ? glucose blood test strip ASCENSIA CONTOUR  NEXT ONE: Use 1 strip to check blood sugar daily 100 each 2  ? injection device for insulin (INPEN 100-BLUE-NOVOLOG-FIASP) DEVI CONSULT MD FOR APP SETTINGS. 1 each 0  ? insulin aspart (NOVOLOG PENFILL) cartridge MAX DAILY DOSE OF 60 UNITS per correction scale 60 mL 3  ? Insulin Pen Needle (B-D UF III MINI PEN NEEDLES) 31G X 5 MM MISC USE FOUR TIMES A DAY AS DIRECTED 400 each 3  ? Insulin Syringe-Needle U-100 31G X 15/64" 0.5 ML MISC 1 Device by Does not apply route daily. 100 each 6  ? Microlet Lancets MISC 3 (three) times daily.    ? Multiple Vitamins-Minerals (BARIATRIC FUSION) CHEW Chew 2 tablets by mouth 2 (two) times daily.    ? mycophenolate (CELLCEPT) 500 MG tablet Take 2 tablets by mouth 2 (two) times daily.    ? oxybutynin (DITROPAN) 5 MG  tablet Take 2 tablets (10 mg total) by mouth 2 (two) times daily. 360 tablet 2  ? sertraline (ZOLOFT) 100 MG tablet Take 1 tablet (100 mg total) by mouth daily. 90 tablet 3  ? tiZANidine (ZANAFLEX) 2 MG tablet

## 2021-05-28 NOTE — Telephone Encounter (Signed)
Medicare/aarp order sent to [facility], [facility] they will reach out to the patient to schedule.  °

## 2021-06-01 ENCOUNTER — Other Ambulatory Visit: Payer: Self-pay

## 2021-06-01 ENCOUNTER — Encounter: Payer: Self-pay | Admitting: Family Medicine

## 2021-06-01 ENCOUNTER — Ambulatory Visit (INDEPENDENT_AMBULATORY_CARE_PROVIDER_SITE_OTHER): Payer: Medicare Other | Admitting: Family Medicine

## 2021-06-01 VITALS — BP 124/66 | HR 65 | Ht 70.0 in | Wt 187.6 lb

## 2021-06-01 DIAGNOSIS — H532 Diplopia: Secondary | ICD-10-CM | POA: Diagnosis not present

## 2021-06-01 DIAGNOSIS — H40013 Open angle with borderline findings, low risk, bilateral: Secondary | ICD-10-CM | POA: Diagnosis not present

## 2021-06-01 DIAGNOSIS — H25813 Combined forms of age-related cataract, bilateral: Secondary | ICD-10-CM | POA: Diagnosis not present

## 2021-06-01 DIAGNOSIS — R682 Dry mouth, unspecified: Secondary | ICD-10-CM

## 2021-06-01 DIAGNOSIS — E113291 Type 2 diabetes mellitus with mild nonproliferative diabetic retinopathy without macular edema, right eye: Secondary | ICD-10-CM | POA: Diagnosis not present

## 2021-06-01 LAB — HM DIABETES EYE EXAM

## 2021-06-01 NOTE — Progress Notes (Signed)
? ? ?  SUBJECTIVE:  ? ?CHIEF COMPLAINT / HPI:  ? ?Dry mouth  Sore throat ?71 year old female presenting with dry mouth and feeling unwell.  She had previously been working on reducing her oxybutynin as potential cause of dry mouth.  She states her main symptoms are sore throat, dry mouth.  She is currently taking oxybutynin 1.5 tabs in the morning and 1.5 at night but increased back to the normal dose which she uses for incontinence. She did note that decreasing the dosage improved her dry throat. She notes she continues to have a metallic taste in her mouth. Denies issues with swallowing but states she has to use caution due to dry mouth.  She states she has an appointment with urology on Friday to consider other options besides the oxybutynin. ? ?PERTINENT  PMH / PSH: Cerebellar atrophy/ataxia - follows with neurology ? ?OBJECTIVE:  ? ?Ht '5\' 10"'$  (1.778 m)   Wt 187 lb 9.6 oz (85.1 kg)   BMI 26.92 kg/m?   ? ?General: NAD, pleasant, able to participate in exam ?HEENT: Moist mucous membranes, no pharyngeal erythema ?Cardiac: RRR, no murmurs. ?Respiratory: CTAB, normal effort, No wheezes, rales or rhonchi ?Skin: warm and dry, no rashes noted ?Psych: Normal affect and mood ? ?ASSESSMENT/PLAN:  ? ?Dry mouth: ?Has been present for some time and she has been seen a few times for this.  It was thought that oxybutynin may be the cause of this and when she decreased the dose it did not improve her symptoms, however she was not able to maintain this due to her urinary incontinence.  She is waiting to get in with urology and has an appointment with them in a few days later this week.  Discussed the case with Dr. Nori Riis and recommend her being evaluated with urology to see if there are other options besides the oxybutynin as this seems to be the cause of her symptoms.  In previous MyChart message with her PCP she discussed feeling unwell but on questioning today she states that the main symptom an issue is her dry mouth and  sometimes sore throat that accompanies this dry mouth.  Discussed return/follow-up precautions and she is otherwise can follow-up with [place] after seeing urology on Friday. ? ? ?Lurline Del, DO ?[place]  ? ? ? ?

## 2021-06-01 NOTE — Patient Instructions (Signed)
I recommend seeing what urology suggest for options besides the oxybutynin as this seems to be the main thing causing your symptoms.  Reach out to [place] after your appointment with them on Friday and let us know if they were able to make any changes or if you continue to have symptoms after their changes happen. ? ?If develop any other concerns in the meantime please let us know. ?

## 2021-06-02 ENCOUNTER — Ambulatory Visit: Payer: Medicare Other

## 2021-06-02 ENCOUNTER — Encounter: Payer: Self-pay | Admitting: Podiatry

## 2021-06-02 ENCOUNTER — Encounter: Payer: Self-pay | Admitting: Internal Medicine

## 2021-06-02 ENCOUNTER — Ambulatory Visit (INDEPENDENT_AMBULATORY_CARE_PROVIDER_SITE_OTHER): Payer: Medicare Other | Admitting: Podiatry

## 2021-06-02 DIAGNOSIS — E1042 Type 1 diabetes mellitus with diabetic polyneuropathy: Secondary | ICD-10-CM

## 2021-06-02 DIAGNOSIS — M79675 Pain in left toe(s): Secondary | ICD-10-CM | POA: Diagnosis not present

## 2021-06-02 DIAGNOSIS — M79674 Pain in right toe(s): Secondary | ICD-10-CM

## 2021-06-02 DIAGNOSIS — B351 Tinea unguium: Secondary | ICD-10-CM

## 2021-06-02 DIAGNOSIS — M2010 Hallux valgus (acquired), unspecified foot: Secondary | ICD-10-CM

## 2021-06-02 DIAGNOSIS — M2040 Other hammer toe(s) (acquired), unspecified foot: Secondary | ICD-10-CM

## 2021-06-02 NOTE — Progress Notes (Signed)
SITUATION ?Reason for Consult: Evaluation for Prefabricated Diabetic Shoes and Custom Diabetic Inserts. ?Patient / Caregiver Report: Patient would like well fitting shoes ? ?OBJECTIVE DATA: ?Patient History / Diagnosis:  ?  ICD-10-CM   ?1. 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  ?2. Hammer toe, unspecified laterality  M20.40   ?  ?3. Acquired hallux valgus, unspecified laterality  M20.10   ?  ? ?Physician Treating Diabetes:  Dr. Lowella Dell ? ?Current or Previous Devices:   Current user ? ?In-Person Foot Examination: ?Ulcers & Callousing:   None ?Deformities:    Hammertoes, bunion ?Sensation:    Compromised  ?Shoe Size:     10.89M ? ?ORTHOTIC RECOMMENDATION ?Recommended Devices: ?- 1x pair prefabricated PDAC approved diabetic shoes; Patient Selected V952M Size 10.89M ?- 3x pair custom-to-patient PDAC approved vacuum formed diabetic insoles. ? ?GOALS OF SHOES AND INSOLES ?- Reduce shear and pressure ?- Reduce / Prevent callus formation ?- Reduce / Prevent ulceration ?- Protect the fragile healing compromised diabetic foot. ? ?Patient would benefit from diabetic shoes and inserts as patient has diabetes mellitus and the patient has one or more of the following conditions: ?- Peripheral neuropathy with evidence of callus formation ?- Foot deformity ?- Poor circulation ? ?ACTIONS PERFORMED ?Potential out of pocket cost was communicated to patient. Patient understood and consented to measurement and casting. Patient was casted for insoles via crush box and measured for shoes via brannock device. Procedure was explained and patient tolerated procedure well. All questions were answered and concerns addressed. Casts were shipped to central fabrication for HOLD until Certificate of Medical Necessity or otherwise necessary authorization from insurance is obtained. ? ?PLAN ?Shoes are to be ordered and casts released from hold once all appropriate paperwork is complete. Patient  is to be contacted and scheduled for fitting once shoes and insoles have been fabricated and received. ? ?

## 2021-06-03 LAB — COPPER, SERUM: Copper: 100 ug/dL (ref 80–158)

## 2021-06-03 LAB — ANA W/REFLEX IF POSITIVE
Anti JO-1: <0.2 AI (ref 0.0–0.9)
Anti Nuclear Antibody (ANA): POSITIVE — AB
Centromere Ab Screen: <0.2 AI (ref 0.0–0.9)
Chromatin Ab SerPl-aCnc: <0.2 AI (ref 0.0–0.9)
ENA RNP Ab: 1.4 AI — ABNORMAL HIGH (ref 0.0–0.9)
ENA SM Ab Ser-aCnc: <0.2 AI (ref 0.0–0.9)
ENA SSA (RO) Ab: <0.2 AI (ref 0.0–0.9)
ENA SSB (LA) Ab: <0.2 AI (ref 0.0–0.9)
Scleroderma (Scl-70) (ENA) Antibody, IgG: <0.2 AI (ref 0.0–0.9)
dsDNA Ab: 1 IU/mL (ref 0–9)

## 2021-06-03 LAB — GLUTAMIC ACID DECARBOXYLASE AUTO ABS

## 2021-06-05 ENCOUNTER — Encounter: Payer: Self-pay | Admitting: Family Medicine

## 2021-06-05 DIAGNOSIS — N3941 Urge incontinence: Secondary | ICD-10-CM | POA: Diagnosis not present

## 2021-06-05 DIAGNOSIS — N958 Other specified menopausal and perimenopausal disorders: Secondary | ICD-10-CM | POA: Diagnosis not present

## 2021-06-05 DIAGNOSIS — N3281 Overactive bladder: Secondary | ICD-10-CM | POA: Diagnosis not present

## 2021-06-07 NOTE — Progress Notes (Signed)
?  Subjective:  ?Patient ID: Joyce Brown, female    DOB: 20-Apr-1950,  MRN: [id number] ? ?Joyce Brown presents to clinic today for at risk foot care with history of diabetic neuropathy and painful thick toenails that are difficult to trim. Pain interferes with ambulation. Aggravating factors include wearing enclosed shoe gear. Pain is relieved with periodic professional debridement. ? ?Patient states blood glucose was 90 mg/dl today.  Last HgA1c was 6.3%. ? ?New problem(s): None.  ? ?PCP is Lind Covert, MD , and last visit was May 18, 2021. ? ?No Known Allergies ? ?Review of Systems: Negative except as noted in the HPI. ? ?Objective: No changes noted in today's physical examination. ?Constitutional Joyce Brown is a pleasant 71 y.o. Caucasian female, WD, WN in NAD. AAO x 3.   ?Vascular CFT immediate b/l LE. Palpable DP/PT pulses b/l LE. Digital hair present b/l. Skin temperature gradient WNL b/l. No pain with calf compression b/l. No edema noted b/l. No cyanosis or clubbing noted b/l LE.  ?Neurologic Normal speech. Oriented to person, place, and time. Protective sensation intact 5/5 intact bilaterally with 10g monofilament b/l. Vibratory sensation intact b/l.  ?Dermatologic Pedal skin warm and supple b/l.  No open wounds b/l. No interdigital macerations. Toenails 1-5 b/l elongated, thickened, discolored with subungual debris. +Tenderness with dorsal palpation of nailplates.No hyperkeratotic nor porokeratotic lesions noted b/l.  ?Orthopedic: Muscle strength 5/5 to all lower extremity muscle groups bilaterally. No pain, crepitus or joint limitation noted with ROM bilateral LE. Hammertoe deformity noted 2-5 b/l. Utilizes motorized chair for mobility assistance.  ? ?Radiographs: None ? ?  Latest Ref Rng & Units 12/25/2020  ?  9:57 AM 06/19/2020  ? 10:12 AM  ?Hemoglobin A1C  ?Hemoglobin-A1c 4.0 - 5.6 % 6.3   6.7    ? ?Assessment/Plan: ?1. Pain due to onychomycosis of toenails of both feet    ?2. Diabetic polyneuropathy associated with type 1 diabetes mellitus ([place])   ? ?-Examined patient. ?-Continue diabetic shoes daily. ?-Toenails 1-5 b/l were debrided in length and girth with sterile nail nippers and dremel without iatrogenic bleeding.  ?-Patient/POA to call should there be question/concern in the interim.  ? ?Return in about 3 months (around 09/02/2021). ? ?Marzetta Board, DPM  ?

## 2021-06-09 ENCOUNTER — Ambulatory Visit
Admission: RE | Admit: 2021-06-09 | Discharge: 2021-06-09 | Disposition: A | Discharge status: Home / Self Care | Payer: Medicare Other | Source: Ambulatory Visit | Attending: Neurology | Admitting: Neurology

## 2021-06-09 ENCOUNTER — Ambulatory Visit: Payer: Medicare Other | Admitting: Neurology

## 2021-06-09 ENCOUNTER — Other Ambulatory Visit: Payer: Self-pay

## 2021-06-09 DIAGNOSIS — G119 Hereditary ataxia, unspecified: Secondary | ICD-10-CM

## 2021-06-09 DIAGNOSIS — E1142 Type 2 diabetes mellitus with diabetic polyneuropathy: Secondary | ICD-10-CM

## 2021-06-09 DIAGNOSIS — R269 Unspecified abnormalities of gait and mobility: Secondary | ICD-10-CM

## 2021-06-11 ENCOUNTER — Encounter: Payer: Self-pay | Admitting: Internal Medicine

## 2021-06-12 ENCOUNTER — Encounter: Payer: Self-pay | Admitting: Neurology

## 2021-06-15 ENCOUNTER — Encounter: Payer: Self-pay | Admitting: Physical Medicine and Rehabilitation

## 2021-06-15 ENCOUNTER — Encounter
Payer: Medicare Other | Attending: Physical Medicine and Rehabilitation | Admitting: Physical Medicine and Rehabilitation

## 2021-06-15 VITALS — BP 129/75 | HR 74 | Temp 98.3°F | Ht 70.0 in | Wt 187.6 lb

## 2021-06-15 DIAGNOSIS — M7711 Lateral epicondylitis, right elbow: Secondary | ICD-10-CM | POA: Insufficient documentation

## 2021-06-15 DIAGNOSIS — M7918 Myalgia, other site: Secondary | ICD-10-CM | POA: Insufficient documentation

## 2021-06-15 DIAGNOSIS — M7712 Lateral epicondylitis, left elbow: Secondary | ICD-10-CM | POA: Insufficient documentation

## 2021-06-15 NOTE — Patient Instructions (Signed)
Plan: ?1. Patient here for trigger point injections for ? Consent done and on chart. ? ?Cleaned areas with alcohol and injected using a 27 gauge 1.5 inch needle ? ?Injected 4cc ?Using 1% Lidocaine with no EPI ? ?Upper traps ?Levators ?Posterior scalenes ?Middle scalenes ?Splenius Capitus B/L  ?Pectoralis Major ?Rhomboids ?Infraspinatus ?Teres Major/minor ?Thoracic paraspinals ?Lumbar paraspinals- R side only ?Other injections-  ?B/L Flexor and extensor compartment of forearm and R first dorsal interossei ? ?A little numbness afterwards.  ? ?Patient's level of pain prior was 3/10 ?Current level of pain after injections is - 6/10-  because of injections locations. Muscles have relaxed a little. Injections usually help her- but delayed.  ? ?There was no bleeding or complications. ? ?Patient was advised to drink a lot of water on day after injections to flush system ?Will have increased soreness for 12-48 hours after injections.  ?Can use Lidocaine patches the day AFTER injections ?Can use theracane on day of injections in places didn't inject ?Can use heating pad 4-6 hours AFTER injections  ? ? ?2. B/L tennis elbow splints- Episport brace- - can get online. Demonstrated how to wear- with velcro on inside. Main time ot wear is during the day.  ? ? ?3. Con't Tizanidine 2-4 mg nightly-gets from PCP.  ? ?4. F/U  in 3 months for f/u and trigger point injections.   ?

## 2021-06-15 NOTE — Progress Notes (Signed)
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 Has has Myofascial pain and fibromyalgia.  ? Here for f/u on cerebellar ataxia and myofascial pain/trigger point injections. ? ?Just started taking Myrbetriq- on 3/31.  Myrbetriq works [employer] better than Oxybutynin- and much less dry mouth! ?Still helping the bladder Sx's. And only has to take 1x/day.  ?Dr Aundra Dubin- Urology- [facility].  ?Has an in house physical therapist for pelvic floor issues.  ? ?Not long after last visit- restarted taking Zanaflex at night 2-4 mg nightly. Found out it was BAD to go without.  ? ? ?Found out from new MRI last week, that has worsening cerebellar  ?Started seeing Dr Evelena Leyden- since Dr Redmond Pulling retired at [facility].  ?New Ophtho- Dr Lucianne Lei- since old one moved to  [place]. 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  ?Vision- cannot put prisms any further out- so using Computer to watch things, not TV.  ? ? ?Traveled to [place] through computer- a few weeks ago- didn't actually leave home.  ? ?Spot in low back- was better until has to lay on MRI table- had to lay on it for ~ 1 hour- Was set off by hard table.  ? ?Has Forearm trigger points and splenius capitus- as well as low back since MRI last week.  ? ? ?Plan: ?1. Patient here for trigger point injections for ? Consent done and on chart. ? ?Cleaned areas with alcohol and injected using a 27 gauge 1.5 inch needle ? ?Injected 4cc ?Using 1% Lidocaine with no EPI ? ?Upper traps ?Levators ?Posterior scalenes ?Middle scalenes ?Splenius Capitus B/L  ?Pectoralis Major ?Rhomboids ?Infraspinatus ?Teres Major/minor ?Thoracic paraspinals ?Lumbar paraspinals- R side only ?Other injections-  ?B/L Flexor and extensor compartment of  forearm and R first dorsal interossei ? ?A little numbness afterwards.  ? ?Patient's level of pain prior was 3/10 ?Current level of pain after injections is - 6/10-  because of injections locations. Muscles have relaxed a little. Injections usually help her- but delayed.  ? ?There was no bleeding or complications. ? ?Patient was advised to drink a lot of water on day after injections to flush system ?Will have increased soreness for 12-48 hours after injections.  ?Can use Lidocaine patches the day AFTER injections ?Can use theracane on day of injections in places didn't inject ?Can use heating pad 4-6 hours AFTER injections  ? ? ?2. B/L tennis elbow splints- Episport brace- - can get online. Demonstrated how to wear- with velcro on inside. Main time ot wear is during the day.  ? ? ?3. Con't Tizanidine 2-4 mg nightly-gets from PCP.  ? ?4. F/U  in 3 months for f/u and trigger point injections.   ?

## 2021-06-17 ENCOUNTER — Ambulatory Visit: Payer: Medicare Other | Admitting: Physical Medicine and Rehabilitation

## 2021-06-17 ENCOUNTER — Encounter: Payer: Self-pay | Admitting: Family Medicine

## 2021-06-20 ENCOUNTER — Other Ambulatory Visit: Payer: Self-pay | Admitting: Internal Medicine

## 2021-06-20 DIAGNOSIS — E109 Type 1 diabetes mellitus without complications: Secondary | ICD-10-CM

## 2021-06-22 ENCOUNTER — Telehealth: Payer: Self-pay

## 2021-06-22 ENCOUNTER — Encounter: Payer: Self-pay | Admitting: Internal Medicine

## 2021-06-22 NOTE — Telephone Encounter (Signed)
Shoes Ordered - V952W 10.58M ?

## 2021-06-26 ENCOUNTER — Encounter: Payer: Self-pay | Admitting: Internal Medicine

## 2021-06-26 ENCOUNTER — Ambulatory Visit (INDEPENDENT_AMBULATORY_CARE_PROVIDER_SITE_OTHER): Payer: Medicare Other | Admitting: Internal Medicine

## 2021-06-26 VITALS — BP 140/82 | HR 76 | Ht 70.0 in | Wt 186.0 lb

## 2021-06-26 DIAGNOSIS — E1042 Type 1 diabetes mellitus with diabetic polyneuropathy: Secondary | ICD-10-CM

## 2021-06-26 DIAGNOSIS — Z9884 Bariatric surgery status: Secondary | ICD-10-CM | POA: Diagnosis not present

## 2021-06-26 DIAGNOSIS — E785 Hyperlipidemia, unspecified: Secondary | ICD-10-CM

## 2021-06-26 DIAGNOSIS — E1065 Type 1 diabetes mellitus with hyperglycemia: Secondary | ICD-10-CM | POA: Diagnosis not present

## 2021-06-26 DIAGNOSIS — E109 Type 1 diabetes mellitus without complications: Secondary | ICD-10-CM

## 2021-06-26 LAB — COMPREHENSIVE METABOLIC PANEL
ALT: 23 U/L (ref 0–35)
AST: 26 U/L (ref 0–37)
Albumin: 4.2 g/dL (ref 3.5–5.2)
Alkaline Phosphatase: 62 U/L (ref 39–117)
BUN: 21 mg/dL (ref 6–23)
CO2: 29 mEq/L (ref 19–32)
Calcium: 9.4 mg/dL (ref 8.4–10.5)
Chloride: 103 mEq/L (ref 96–112)
Creatinine, Ser: 0.87 mg/dL (ref 0.40–1.20)
GFR: 68.27 mL/min (ref >60.00)
Glucose, Bld: 72 mg/dL (ref 70–99)
Potassium: 4.4 mEq/L (ref 3.5–5.1)
Sodium: 139 mEq/L (ref 135–145)
Total Bilirubin: 0.4 mg/dL (ref 0.2–1.2)
Total Protein: 6.1 g/dL (ref 6.0–8.3)

## 2021-06-26 LAB — LIPID PANEL
Cholesterol: 157 mg/dL (ref 0–200)
HDL: 57.4 mg/dL (ref >39.00)
LDL Cholesterol: 81 mg/dL (ref 0–99)
NonHDL: 99.4
Total CHOL/HDL Ratio: 3
Triglycerides: 94 mg/dL (ref 0.0–149.0)
VLDL: 18.8 mg/dL (ref 0.0–40.0)

## 2021-06-26 LAB — MICROALBUMIN / CREATININE URINE RATIO
Creatinine,U: 82.4 mg/dL
Microalb Creat Ratio: 0.8 mg/g (ref 0.0–30.0)
Microalb, Ur: <0.7 mg/dL (ref 0.0–1.9)

## 2021-06-26 LAB — POCT GLYCOSYLATED HEMOGLOBIN (HGB A1C): Hemoglobin A1C: 6.1 % — AB (ref 4.0–5.6)

## 2021-06-26 LAB — TSH: TSH: 1.37 u[IU]/mL (ref 0.35–5.50)

## 2021-06-26 MED ORDER — NOVOLOG FLEXPEN 100 UNIT/ML ~~LOC~~ SOPN: PEN_INJECTOR | SUBCUTANEOUS | 3 refills | Status: DC

## 2021-06-26 MED ORDER — TRESIBA FLEXTOUCH 100 UNIT/ML ~~LOC~~ SOPN: 15.0000 [IU] | PEN_INJECTOR | Freq: Every day | SUBCUTANEOUS | 3 refills | Status: DC

## 2021-06-26 MED ORDER — "INSULIN SYRINGE-NEEDLE U-100 30G X 5/16"" 0.3 ML MISC": 1.0000 | Freq: Three times a day (TID) | 3 refills | Status: DC

## 2021-06-26 NOTE — Progress Notes (Signed)
?  ? ?Name: Joyce Brown  ?Age/ Sex: 71 y.o., female   ?MRN/ DOB: [id number], Jul 02, 1950    ? ?PCP: Lind Covert, MD   ?Reason for Endocrinology Evaluation: Type 1 Diabetes Mellitus  ?Initial Endocrine Consultative Visit: 01/17/2018  ? ? ?PATIENT IDENTIFIER: Joyce Brown is a 71 y.o. female with a past medical history of T1DM, cerebellar atrophy, asthma, OSA on CPAP and Hx of gastric bypass in 2014  . The patient has followed with Endocrinology clinic since 01/17/18 for consultative assistance with management of her diabetes. ? ?Patient moved from [place] in September, 2019 to be close to her sister. She has been diagnosed with stiff person Syndrome but Dr. Jannifer Franklin thought this may be more of cerebellar atrophy. She was on  IVIG until 11/07/2018. She is on cellcept ? 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 She has only had one DKA episode which was in 11/2017. Marland Kitchen Her hemoglobin A1c has ranged from 8.4% in 01/2017, peaking at 12.1 % in 01/2016 ? ?She is followed by Dr. 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 She has been off IVIG since 02/2018. He has been tapering her diazepam down and is currently on cellcept.  ? ? ?Started using the inpen 07/2018 ? ?SUBJECTIVE:  ? ?During the last visit (12/25/2020): A1c 6.3 % . We adjusted MDI regimen  ? ? ? ? ?Today (06/26/2021): Joyce Brown is here for a months follow up visit on diabetes.  She checks her blood sugars multiple times daily, using  CGM (Dexcom). The patient has had hypoglycemic episodes since the last clinic visit. The patient is symptomatic with these episodes.  These typically occur after lunch. ? ? ?She had a right 5th metatarsal fracture 09/2020 following a syncopal episode due to hypoglycemia with a BG reading of 48 mg/DL-follows with podiatry her last visit was on  06/02/2021 ? ?She follows with physical medicine and rehab for ataxia, s/p trigger point injections.  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 ?She is currently on the [place] ? ?Denies nausea, vomiting or diarrhea ? ?HOME DIABETES REGIMEN:  ?Tresiba 15 units QHS ?Novolog 8 units  With Breakfast, 12 units with lunch and 8 units with supper  ?CF (BG - 110/60) ? ? ? ?CONTINUOUS GLUCOSE MONITORING RECORD INTERPRETATION   ? ?Dates of Recording: 4/1 - 06/26/2021 ?Sensor description:Dexcom ? ?Results statistics: ?  ?CGM use % of time 64  ?Average and SD 158/56  ?Time in range 67 %  ?% Time Above 180 23  ?% Time above 250 7  ?% Time Below target <  ? ? ?Glycemic patterns summary: BG's have been optimal during the night and in between the meals, hyperglycemia noted postprandial ?Hypoglycemic episodes noted after lunch ? ?Overnight periods: trends down without hypoglycemia overnight ? ?IN-Pen report 4/3-4/13/2023 ? ?Average 140 ?Missed doses: 0 ?SD 43 ?TDD 42.2 ? ? ?DIABETIC COMPLICATIONS: ?Microvascular complications:  ?Neuropathy, left eye retinopathy ?Denies: retinopathy, CKD ?Last eye exam: Completed 05/29/2021 ?  ?Macrovascular complications:  ?  ?Denies: CAD, PVD, CVA ?  ?  ? ? ? ? ?HISTORY:  ?Past Medical History:  ?Past Medical  History:  ?Diagnosis Date  ? Cerebellar ataxia ([place])   ? Diabetes mellitus without complication ([place])   ? Diabetic neuropathy ([place]) 12/09/2020  ? Foot fracture 2022  ? Gait abnormality 11/07/2018  ? Hyperlipidemia   ? Pulmonary embolism ([place])   ? Sleep apnea   ? Spinal stenosis   ? ?Past Surgical History:  ?Past Surgical History:  ?Procedure Laterality Date  ? ABDOMINAL HYSTERECTOMY    ? ADENOIDECTOMY    ? ANKLE SURGERY Left   ? BILATERAL CARPAL  TUNNEL RELEASE    ? BREAST BIOPSY Left 2019  ? benign  ? ELBOW SURGERY    ? GASTRIC BYPASS  2014  ? HAND NEUROPLASTY    ? radio denervation  05/21/2020  ? TONSILLECTOMY    ? ?Social History:  reports that she has never smoked. She has never used smokeless tobacco. She reports that she does not drink alcohol and does not use drugs. ?Family History:  ?Family History  ?Problem Relation Age of Onset  ? Cervical cancer Mother   ? Ovarian cancer Mother   ? Dementia Father   ? Retinal detachment Father   ? Heart attack Father   ? Diabetes Father   ? Stroke Father   ? Hypertension Father   ? Ovarian cancer Sister   ? Arthritis Sister   ? Arthritis Sister   ? Arthritis Sister   ? Glaucoma Paternal Grandfather   ? ? ? ?HOME MEDICATIONS: ?Allergies as of 06/26/2021   ?No Known Allergies ?  ? ?  ?Medication List  ?  ? ?  ? Accurate as of June 26, 2021 10:05 AM. If you have any questions, ask your nurse or doctor.  ?  ?  ? ?  ? ?STOP taking these medications   ? ?oxybutynin 5 MG tablet ?Commonly known as: DITROPAN ?Stopped by: Dorita Sciara, MD ?  ? ?  ? ?TAKE these medications   ? ?3 Series BP Monitor/Upper Arm Devi ?Take your blood pressure several times a week when relaxed ?  ?acetaminophen 325 MG tablet ?Commonly known as: TYLENOL ?Take 650 mg by mouth every 6 (six) hours as needed for moderate pain. ?  ?atorvastatin 80 MG tablet ?Commonly known as: LIPITOR ?Take 1 tablet (80 mg total) by mouth daily. ?What changed: Another medication with the same name was removed. Continue taking this medication, and follow the directions you see here. ?Changed by: Dorita Sciara, MD ?  ?B-D UF III MINI PEN NEEDLES 31G X 5 MM Misc ?Generic drug: Insulin Pen Needle ?USE FOUR TIMES A DAY AS DIRECTED ?  ?Bariatric Fusion Chew ?Chew 2 tablets by mouth 2 (two) times daily. ?  ?Calcium Citrate 150 MG Caps ?Take 300 mg by mouth in the morning and at bedtime. ?  ?carboxymethylcellulose 0.5 % Soln ?Commonly known as: REFRESH  PLUS ?Apply to eye. ?  ?Dexcom G6 Sensor Misc ?1 each by Other route daily. ?  ?Dexcom G6 Transmitter Misc ?1 each by Misc.(Non-Drug; Combo Route) route. Use as directed for continuous glucose monitoring. Reuse transmitter for 90 days then discard and replace. ?  ?gabapentin 100 MG capsule ?Commonly known as: NEURONTIN ?TAKE ONE CAPSULE BY MOUTH THREE TIMES A DAY ?  ?glucose blood test strip ?ASCENSIA CONTOUR NEXT ONE: Use 1 strip to check blood sugar daily ?  ?InPen 100-Blue-Novolog-Fiasp Devi ?Generic drug: injection device for insulin ?CONSULT MD FOR APP SETTINGS. ?  ?Insulin Syringe-Needle U-100 31G X 15/64" 0.5 ML Misc ?1 Device by Does not  apply route daily. ?  ?Microlet Lancets Misc ?3 (three) times daily. ?  ?mycophenolate 500 MG tablet ?Commonly known as: CELLCEPT ?Take 2 tablets (1,000 mg total) by mouth 2 (two) times daily. ?  ?Myrbetriq 50 MG Tb24 tablet ?Generic drug: mirabegron ER ?Take 50 mg by mouth daily. ?  ?NovoLOG PenFill cartridge ?Generic drug: insulin aspart ?MAX DAILY DOSE OF 60 UNITS per correction scale ?What changed: Another medication with the same name was removed. Continue taking this medication, and follow the directions you see here. ?Changed by: Dorita Sciara, MD ?  ?sertraline 100 MG tablet ?Commonly known as: ZOLOFT ?Take 1 tablet (100 mg total) by mouth daily. ?What changed: Another medication with the same name was removed. Continue taking this medication, and follow the directions you see here. ?Changed by: Dorita Sciara, MD ?  ?tiZANidine 2 MG tablet ?Commonly known as: ZANAFLEX ?TAKE 1 TO 2 TABLETS BY MOUTH AT BEDTIME AS NEEDED FOR MUSCLE SPASMS ?  ?Tyler Aas FlexTouch 100 UNIT/ML FlexTouch Pen ?Generic drug: insulin degludec ?Inject 16 Units into the skin daily. ?  ?Vitamin D3 75 MCG (3000 UT) Tabs ?Take 75 mcg by mouth daily. ?  ? ?  ? ? ?PHYSICAL EXAM: ?VS: BP 140/82 (BP Location: Left Arm, Patient Position: Sitting, Cuff Size: Normal)   Pulse 76   Ht '5\' 10"'$   (1.778 m)   Wt 186 lb (84.4 kg)   SpO2 98%   BMI 26.69 kg/m?   ? ?EXAM: ?General: Pt appears well and is in NAD  ?Lungs: Clear with good BS bilat with no rales, rhonchi, or wheezes  ?Heart: Auscultation: RRR   ?Extremities:  ?

## 2021-06-26 NOTE — Patient Instructions (Addendum)
-   Continue Tresiba 15 units daily  ?- Change Novolog 8 units with Breakfast , 11 units with Lunch and 8 units supper ?Novolog correctional insulin: ADD extra units on insulin to your meal-time Novolog dose if your blood sugars are higher than 180. Use the scale below to help guide you:  ? ?Blood sugar before meal Number of units to inject  ?Less than 180 0 unit  ?181 -  240 1 units  ?241 -  300 2 units  ?301 -  360 3 units  ?361 -  420 4 units  ? ? ? ?HOW TO TREAT LOW BLOOD SUGARS (Blood sugar LESS THAN 70 MG/DL) ?Please follow the RULE OF 15 for the treatment of hypoglycemia treatment (when your (blood sugars are less than 70 mg/dL)  ? ?STEP 1: Take 15 grams of carbohydrates when your blood sugar is low, which includes:  ?3-4 GLUCOSE TABS  OR ?3-4 OZ OF JUICE OR REGULAR SODA OR ?ONE TUBE OF GLUCOSE GEL   ? ?STEP 2: RECHECK blood sugar in 15 MINUTES ?STEP 3: If your blood sugar is still low at the 15 minute recheck --> then, go back to STEP 1 and treat AGAIN with another 15 grams of carbohydrates. ? ?

## 2021-06-29 ENCOUNTER — Encounter: Payer: Self-pay | Admitting: Internal Medicine

## 2021-06-29 DIAGNOSIS — Z9884 Bariatric surgery status: Secondary | ICD-10-CM | POA: Insufficient documentation

## 2021-07-01 ENCOUNTER — Encounter: Payer: Self-pay | Admitting: Family Medicine

## 2021-07-02 ENCOUNTER — Encounter: Payer: Self-pay | Admitting: Neurology

## 2021-07-02 DIAGNOSIS — Z20822 Contact with and (suspected) exposure to covid-19: Secondary | ICD-10-CM | POA: Diagnosis not present

## 2021-07-06 ENCOUNTER — Telehealth: Payer: Self-pay | Admitting: Neurology

## 2021-07-06 DIAGNOSIS — G119 Hereditary ataxia, unspecified: Secondary | ICD-10-CM

## 2021-07-06 DIAGNOSIS — R269 Unspecified abnormalities of gait and mobility: Secondary | ICD-10-CM

## 2021-07-06 NOTE — Telephone Encounter (Signed)
Orders Placed This Encounter  ?Procedures  ? AMB referral to rehabilitation  ? ?   ?

## 2021-07-07 ENCOUNTER — Telehealth: Payer: Self-pay | Admitting: Neurology

## 2021-07-07 NOTE — Telephone Encounter (Signed)
Referral for Rehabilitation sent to sent [place] at [facility] [phone]. ?

## 2021-07-08 DIAGNOSIS — Z20822 Contact with and (suspected) exposure to covid-19: Secondary | ICD-10-CM | POA: Diagnosis not present

## 2021-07-14 ENCOUNTER — Encounter: Payer: Self-pay | Admitting: Family Medicine

## 2021-07-14 DIAGNOSIS — J029 Acute pharyngitis, unspecified: Secondary | ICD-10-CM

## 2021-07-17 DIAGNOSIS — N3941 Urge incontinence: Secondary | ICD-10-CM | POA: Diagnosis not present

## 2021-07-17 DIAGNOSIS — N3281 Overactive bladder: Secondary | ICD-10-CM | POA: Diagnosis not present

## 2021-07-18 DIAGNOSIS — Z20822 Contact with and (suspected) exposure to covid-19: Secondary | ICD-10-CM | POA: Diagnosis not present

## 2021-07-20 ENCOUNTER — Ambulatory Visit: Payer: Medicare Other | Attending: Neurology | Admitting: Physical Therapy

## 2021-07-20 ENCOUNTER — Other Ambulatory Visit: Payer: Self-pay

## 2021-07-20 ENCOUNTER — Encounter: Payer: Self-pay | Admitting: Physical Therapy

## 2021-07-20 DIAGNOSIS — R2681 Unsteadiness on feet: Secondary | ICD-10-CM | POA: Diagnosis not present

## 2021-07-20 DIAGNOSIS — M6281 Muscle weakness (generalized): Secondary | ICD-10-CM | POA: Diagnosis not present

## 2021-07-20 DIAGNOSIS — R270 Ataxia, unspecified: Secondary | ICD-10-CM | POA: Diagnosis not present

## 2021-07-20 DIAGNOSIS — M7711 Lateral epicondylitis, right elbow: Secondary | ICD-10-CM | POA: Insufficient documentation

## 2021-07-20 DIAGNOSIS — R29818 Other symptoms and signs involving the nervous system: Secondary | ICD-10-CM | POA: Diagnosis not present

## 2021-07-20 DIAGNOSIS — R2689 Other abnormalities of gait and mobility: Secondary | ICD-10-CM | POA: Insufficient documentation

## 2021-07-20 DIAGNOSIS — R41840 Attention and concentration deficit: Secondary | ICD-10-CM | POA: Diagnosis not present

## 2021-07-20 DIAGNOSIS — M7712 Lateral epicondylitis, left elbow: Secondary | ICD-10-CM | POA: Diagnosis not present

## 2021-07-20 DIAGNOSIS — R278 Other lack of coordination: Secondary | ICD-10-CM | POA: Diagnosis not present

## 2021-07-20 NOTE — Patient Instructions (Signed)
Access Code: [email or web address] ?URL: [email or web address] ?Date: 07/20/2021 ?Prepared by: [facility] ? ?Exercises ?- Seated Gluteal Sets  - 1 x daily - 7 x weekly - 2 sets - 10 reps - 3 sec hold ?- Seated Active Hip Flexion  - 1 x daily - 7 x weekly - 2 sets - 10 reps - 5 sec hold ?

## 2021-07-20 NOTE — Therapy (Signed)
?OUTPATIENT PHYSICAL THERAPY NEURO EVALUATION ? ? ?Patient Name: Joyce Brown ?MRN: [id number] ?DOB:1950-07-09, 71 y.o., female ?Today's Date: 07/20/2021 ? ?PCP: Talbert Cage, MD ?REFERRING PROVIDER: Marcial Pacas, MD ? ? PT End of Session - 07/20/21 0855   ? ? Visit Number 1   ? Number of Visits 18   ? Date for PT Re-Evaluation 09/14/21   ? Authorization Type Medicare/AARP   ? PT Start Time [phone]   ? PT Stop Time 8242   ? PT Time Calculation (min) 42 min   ? Equipment Utilized During Treatment Gait belt   ? Activity Tolerance Patient tolerated treatment well   ? Behavior During Therapy [facility] for tasks assessed/performed   ? ?  ?  ? ?  ? ? ?Past Medical History:  ?Diagnosis Date  ? Cerebellar ataxia ([place])   ? Diabetes mellitus without complication ([place])   ? Diabetic neuropathy ([place]) 12/09/2020  ? Foot fracture 2022  ? Gait abnormality 11/07/2018  ? Hyperlipidemia   ? Pulmonary embolism ([place])   ? Sleep apnea   ? Spinal stenosis   ? ?Past Surgical History:  ?Procedure Laterality Date  ? ABDOMINAL HYSTERECTOMY    ? ADENOIDECTOMY    ? ANKLE SURGERY Left   ? BILATERAL CARPAL TUNNEL RELEASE    ? BREAST BIOPSY Left 2019  ? benign  ? ELBOW SURGERY    ? GASTRIC BYPASS  2014  ? HAND NEUROPLASTY    ? radio denervation  05/21/2020  ? TONSILLECTOMY    ? ?Patient Active Problem List  ? Diagnosis Date Noted  ? H/O bariatric surgery 06/29/2021  ? Bilateral tennis elbow 06/15/2021  ? Pharyngitis 04/30/2021  ? 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 Abscess 03/05/2021  ? Type 1 diabetes mellitus with diabetic polyneuropathy ([place]) 12/25/2020  ? Urinary frequency 12/16/2020  ? Diabetic neuropathy ([place]) 12/09/2020  ? Myofascial pain 09/12/2020  ? Wheelchair dependence 09/12/2020  ? Dyslipidemia 06/19/2020  ? Spondylosis without myelopathy or radiculopathy, lumbar region 04/27/2020  ? Sensorineural hearing loss (SNHL) of both ears 08/21/2019  ? Tinnitus, bilateral 06/27/2019  ? FH: ovarian cancer in first degree relative  11/16/2018  ? Hypercholesteremia 11/15/2018  ? Gait abnormality 11/07/2018  ? Pain of left hip joint 05/18/2018  ? Chronic midline low back pain with bilateral sciatica 05/18/2018  ? Breast calcifications 12/28/2017  ? Fibromyalgia 12/06/2017  ? History of gastric bypass 12/06/2017  ? Spinal stenosis 12/06/2017  ? Hypokalemia 12/06/2017  ? Asthma 12/06/2017  ? Cerebellar ataxia ([place])   ? Type I diabetes mellitus with complication, uncontrolled   ? Serum albumin decreased 11/10/2017  ? Vitamin B6 deficiency 11/10/2017  ? 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  ? Hypoglycemia due to type 1 diabetes mellitus ([place]) 06/20/2016  ? Hyperparathyroidism ([place]) 10/29/2014  ? Increased PTH level 10/01/2014  ? Obstructive sleep apnea on CPAP 10/01/2014  ? Non-organic enuresis 05/28/2014  ? Urge incontinence of urine 05/28/2014  ? History of vaginal hysterectomy 05/24/2014  ? History of hypertension 04/01/2014  ? 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 Pain of lumbar facet joint 04/11/2013  ? Mild persistent asthma without complication 35/36/1443  ? Carpal tunnel syndrome 01/09/2013  ? Lichen planus 15/40/0867  ? Adjustment disorder with mixed anxiety and depressed mood 05/11/2012  ? Degeneration of lumbar intervertebral disc 04/26/2012  ? Pain in joint involving ankle and foot 04/26/2012  ? Osteoarthritis of hand 05/24/2011  ? Leukopenia 09/25/2009  ? ? ?ONSET DATE: 07/06/2021 (MD referral); initial symptoms 2007 ? ?  REFERRING DIAG: G11.9 (ICD-10-CM) - Cerebellar ataxia ([place]) R26.9 (ICD-10-CM) - Gait abnormality  ? ?THERAPY DIAG:  ?Other abnormalities of gait and mobility ? ?Unsteadiness on feet ? ?Muscle weakness (generalized) ? ?Other symptoms and signs involving the nervous system ? ?SUBJECTIVE:  ?                                                                                                                                                                                           ? ?SUBJECTIVE STATEMENT: ?Pt  reports feeling deconditioned, declined in mobility since breaking her foot in fall of 2022.  Was in her w/c and then was on the floor.  No other falls.  She primarily uses w/c in her apartment.  Has a RW.  Per pt report provided at eval for mobility limitations at eval: ? -can SPT with something to hold onto ? -standing tolerance is very low ? -unable to walk independently, close guard is needed ?-high fall risk due to weakness in legs, especially LLE; falls have happened backwards direction ?-speech at times is slow ?-notes decreased fine motor coordination ? ?Pt accompanied by: self ? ?PERTINENT HISTORY: Has Cerebellar atrophy with ataxia and also chronic low back pain s/p radiofrequency ablation 3/22; ?Has had trigger point injections for L tennis elbow. Has has Myofascial pain and fibromyalgia.  lumbar spondylosis, chronic LBP ? ?PMH:  DM, neuropathy, closed foot fracture fall of 2022, HLD, PE, spinal stenosis, tennis elbow ? ?PAIN:  ?Are you having pain? Yes: NPRS scale: 5/10 ?Pain location: L elbow, forearm ?Pain description: sharp, achy ?Aggravating factors: opening objects, extending fingers ?Relieving factors: trigger point injections ? ?PRECAUTIONS: Fall ? -hx of Type 1 DM, carries glucose tablets with her; follows the "Rule of 15" ? ?WEIGHT BEARING RESTRICTIONS No ? ?FALLS: Has patient fallen in last 6 months? Yes. Number of falls 1 ? ?LIVING ENVIRONMENT: ?Lives with: lives alone ?Lives in: House/apartment ?Stairs: No ?Has following equipment at home: Gilford Rile - 2 wheeled, Wheelchair (manual), shower chair, Grab bars, and elevated toilet seat; reachers.  Has Lifeline system. ? ?PLOF: Independent with household mobility with device and Independent with community mobility with device.  Prior to pt's foot fracture, was able to do 25% more compared to today.  ? ?PATIENT GOALS Pt's goals for therapy are to increase strength and mobility.   ? ?OBJECTIVE:  ? ? ?COGNITION: ?Overall cognitive status: Within  functional limits for tasks assessed ?  ?SENSATION: ?WFL ? ?COORDINATION: ?decreased ? ? ?POSTURE: No Significant postural limitations in sitting.  In standing, pt demo forward flexed posture. ? ?LE ROM:    ? ?Active  Right ?07/20/2021 Left ?  07/20/2021  ?Hip flexion [facility] WFL  ?Hip extension    ?Hip abduction    ?Hip adduction    ?Hip internal rotation    ?Hip external rotation    ?Knee flexion [facility] WFL  ?Knee extension [facility] WFL  ?Ankle dorsiflexion [facility] WFL  ?Ankle plantarflexion    ?Ankle inversion    ?Ankle eversion    ? (Blank rows = not tested) ? ?MMT:   ? ?MMT Right ?07/20/2021 Left ?07/20/2021  ?Hip flexion 5/5 5/5  ?Hip extension    ?Hip abduction 4/5 4/5  ?Hip adduction 4/5 4/5  ?Hip internal rotation    ?Hip external rotation    ?Knee flexion 5/5 5/5  ?Knee extension 5/5 5/5  ?Ankle dorsiflexion 3+/5 3+/5  ?Ankle plantarflexion    ?Ankle inversion    ?Ankle eversion    ?(Blank rows = not tested) ? ? ? ?TRANSFERS: ?Assistive device utilized: Environmental consultant - 2 wheeled  ?Sit to stand: SBA ?Stand to sit: SBA ?Pt stands at RW x 1:26, prior to needing to sit. ? ?GAIT: ?Gait pattern: step to pattern, step through pattern, ataxic, and wide BOS ?Distance walked: 20 ft ?Assistive device utilized: Environmental consultant - 2 wheeled ?Level of assistance: CGA and Min A ?Comments: With TUG test.  Pt reports difficulty with ramp/incline negotiation. ? ?FUNCTIONAL TESTs:  ?Timed up and go (TUG): 50.34 sec ? ? ? ?TODAY'S TREATMENT:  ?Initiated HEP-see instructions ? ? ?PATIENT EDUCATION: ?Education details: Educated in initial HEP as well as PT eval results, POC. ?Person educated: Patient ?Education method: Explanation, Demonstration, and Handouts ?Education comprehension: verbalized understanding and returned demonstration ? ? ?HOME EXERCISE PROGRAM: ?[place] Access Code: [email or web address] ?URL: [email or web address] ?Date: 07/20/2021 ?Prepared by: [facility] ? ?Exercises ?- Seated Gluteal Sets  - 1 x daily - 7  x weekly - 2 sets - 10 reps - 3 sec hold ?- Seated Active Hip Flexion  - 1 x daily - 7 x weekly - 2 sets - 10 reps - 5 sec hold ? ? ? ?GOALS: ?Goals reviewed with patient? Yes ? ?SHORT TERM GOALS: Target date: 08/17/2021

## 2021-07-21 NOTE — Therapy (Signed)
?OUTPATIENT PHYSICAL THERAPY NEURO TREATMENT ? ? ?Patient Name: Joyce Brown ?MRN: [id number] ?DOB:02-25-1951, 71 y.o., female ?Today's Date: 07/22/2021 ? ?PCP: Talbert Cage, MD ?REFERRING PROVIDER: Marcial Pacas, MD ? ?END OF SESSION:  ? PT End of Session - 07/22/21 0935   ? ? Visit Number 2   ? Number of Visits 18   ? Date for PT Re-Evaluation 09/14/21   ? Authorization Type Medicare/AARP   ? PT Start Time 0938   ? PT Stop Time 1036   ? PT Time Calculation (min) 51 min   ? Equipment Utilized During Treatment Gait belt   ? Activity Tolerance Patient tolerated treatment well   ? Behavior During Therapy [facility] for tasks assessed/performed   ? ?  ?  ? ?  ? ? ?Past Medical History:  ?Diagnosis Date  ? Cerebellar ataxia ([place])   ? Diabetes mellitus without complication ([place])   ? Diabetic neuropathy ([place]) 12/09/2020  ? Foot fracture 2022  ? Gait abnormality 11/07/2018  ? Hyperlipidemia   ? Pulmonary embolism ([place])   ? Sleep apnea   ? Spinal stenosis   ? ?Past Surgical History:  ?Procedure Laterality Date  ? ABDOMINAL HYSTERECTOMY    ? ADENOIDECTOMY    ? ANKLE SURGERY Left   ? BILATERAL CARPAL TUNNEL RELEASE    ? BREAST BIOPSY Left 2019  ? benign  ? ELBOW SURGERY    ? GASTRIC BYPASS  2014  ? HAND NEUROPLASTY    ? radio denervation  05/21/2020  ? TONSILLECTOMY    ? ?Patient Active Problem List  ? Diagnosis Date Noted  ? H/O bariatric surgery 06/29/2021  ? Bilateral tennis elbow 06/15/2021  ? Pharyngitis 04/30/2021  ? 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 Abscess 03/05/2021  ? Type 1 diabetes mellitus with diabetic polyneuropathy ([place]) 12/25/2020  ? Urinary frequency 12/16/2020  ? Diabetic neuropathy ([place]) 12/09/2020  ? Myofascial pain 09/12/2020  ? Wheelchair dependence 09/12/2020  ? Dyslipidemia 06/19/2020  ? Spondylosis without myelopathy or radiculopathy, lumbar region 04/27/2020  ? Sensorineural hearing loss (SNHL) of both ears 08/21/2019  ? Tinnitus, bilateral 06/27/2019  ? FH: ovarian cancer in first degree  relative 11/16/2018  ? Hypercholesteremia 11/15/2018  ? Gait abnormality 11/07/2018  ? Pain of left hip joint 05/18/2018  ? Chronic midline low back pain with bilateral sciatica 05/18/2018  ? Breast calcifications 12/28/2017  ? Fibromyalgia 12/06/2017  ? History of gastric bypass 12/06/2017  ? Spinal stenosis 12/06/2017  ? Hypokalemia 12/06/2017  ? Asthma 12/06/2017  ? Cerebellar ataxia ([place])   ? Type I diabetes mellitus with complication, uncontrolled   ? Serum albumin decreased 11/10/2017  ? Vitamin B6 deficiency 11/10/2017  ? 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  ? Hypoglycemia due to type 1 diabetes mellitus ([place]) 06/20/2016  ? Hyperparathyroidism ([place]) 10/29/2014  ? Increased PTH level 10/01/2014  ? Obstructive sleep apnea on CPAP 10/01/2014  ? Non-organic enuresis 05/28/2014  ? Urge incontinence of urine 05/28/2014  ? History of vaginal hysterectomy 05/24/2014  ? History of hypertension 04/01/2014  ? 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 Pain of lumbar facet joint 04/11/2013  ? Mild persistent asthma without complication 18/29/9371  ? Carpal tunnel syndrome 01/09/2013  ? Lichen planus 69/67/8938  ? Adjustment disorder with mixed anxiety and depressed mood 05/11/2012  ? Degeneration of lumbar intervertebral disc 04/26/2012  ? Pain in joint involving ankle and foot 04/26/2012  ? Osteoarthritis of hand 05/24/2011  ? Leukopenia 09/25/2009  ? ? ?ONSET DATE: 07/06/2021 (MD  referral); initial symptoms 2007 ? ?REFERRING DIAG: G11.9 (ICD-10-CM) - Cerebellar ataxia ([place]) R26.9 (ICD-10-CM) - Gait abnormality  ? ?THERAPY DIAG:  ?Other abnormalities of gait and mobility ? ?Unsteadiness on feet ? ?Muscle weakness (generalized) ? ?Other symptoms and signs involving the nervous system ? ?SUBJECTIVE:  ?                                                                                                                                                                                           ? ?SUBJECTIVE  STATEMENT: ?Has been working on her exercises. Denies questions.  ? ?Pt accompanied by: self ? ?PERTINENT HISTORY: Has Cerebellar atrophy with ataxia and also chronic low back pain s/p radiofrequency ablation 3/22; Has had trigger point injections for L tennis elbow. Has has Myofascial pain and fibromyalgia.  lumbar spondylosis, chronic LBP ? ?PMH:  DM, neuropathy, closed foot fracture fall of 2022, HLD, PE, spinal stenosis, tennis elbow ? ?PAIN:  ?Are you having pain? Yes: NPRS scale: 5/10 ?Pain location: L elbow, forearm ?Pain description: sharp, achy ?Aggravating factors: opening objects, extending fingers ?Relieving factors: trigger point injections ? ?PRECAUTIONS: Fall ? -hx of Type 1 DM, carries glucose tablets with her; follows the "Rule of 15" ? ?PATIENT GOALS Pt's goals for therapy are to increase strength and mobility.   ? ? ? ? ?TODAY'S TREATMENT: 07/22/21 ?Activity Comments  ?Seated glute sets 10x5" Good form  ?Seated active hip flexion 10x Good neutral spine  ?Scapular retraction 10x3"   ?Sitting row red TB 2x10 Cues for scap retraction and shoulder depression  ?Sitting shoulder extension with red TB 2x10 Cues to contract core and maintain elbows straight  ?STS from w/c in front of counter top x5 with unsupported standing balance for a few sec after each rep Cues to scoot to edge of seat, shift hips forward upon standing; encouraged patient to reach for counter d/t to encourage anterior wt shift d/t tendency for posterior LOB and snapping knees back into w/c  ?Mod thomas stretch 30" each with foot on 8" stool Cues to contract core; very tight and limited but tolerating well  ? ?Berg Balance Scale: ? ?Sitting to Standing (4): able to stand w/o using hands and stabilize I ?(3): able to stand I using hands ?(2): able to stand using hands after several tries ?(1): needs Min aid to stand or stabilize ?(0): needs ModA to MaxA to stand ?SCORE: 3  ?Standing Unsupported (4): able to stand safely for 2 mins w/o  holding on ?(3): able to stand 2 mins w/supervision ?(2): able to stand 30 secs. Unsupported ?(1): needs several tries to stand 30 secs  unsupported ?(0): unable to stand 30 secs unsupported ?SCORE: 2  ?Sitting Unsupported (4): able to sit safely and securely for 2 mins ?(3): able to sit 2 mins w/supervision ?(2): able to sit 30 secs ?(1): able to sit 10 secs ?(0): unable to sit unsupported ?SCORE: 4  ?Standing to Sitting (4): sits safely w/min use of hands ?(3): controls descent w/hands ?(2): uses backs of legs against chair to control descent ?(1): sits I but has uncontrolled descent ?(0): needs assistance to sit ?SCORE: 3  ?Transfers (4): able to transfer safely w/minor use of hands ?(3): able to transfer safely definite need of hands ?(2): able to transfer w/verbal cues and/or supervision ?(1): needs 1 person to assist ?(0): needs 2 people to assist or supervise to be safe ?SCORE: 2  ?Standing Unsupported w/ Eyes Closed (4): able to stand 10 secs safely ?(3): able to stand 10 secs w/supervision ?(2): able to stand 3 secs ?(1): unable to keep eyes closed 3 secs but stays safely ?(0): needs help to keep from falling ?SCORE: 3  ?Standing Unsupported w/Feet Together (4): able to place feet together I and stand 1 min safely ?(3): able to place feet together I and stand 1 min w/supervision ?(2): able to place feet together I but unable to hold 30 secs ?(1): needs help to attain position but able to stand 15 secs feet together ?(0): needs help to attain position and unable to hold for 15 secs ?SCORE: 0  ?Reaching Fwd while Standing (4): reach fwd 25cm/10in ?(3): reach fwd 12cm/5in ?(2): reach fwd 5cm/2in ?(1): reach fwd but needs supervision ?(0): loses balance while trying/requires external support ?SCORE: 1  ?Pick Up Object from Floor while Standing (4): able to pick up safely and easily ?(3): able to pick up but needs supervision ?(2): unable to pick up but reaches 1-2 in from object and keeps balance I ?(1): unable  to pick up and needs supervision while trying ?(0): needs assist to keep from losing balance or falling ?SCORE: 0  ?Turning to Look Behind L and R Shoulders Standing (4): looks behind from both sides and weig

## 2021-07-22 ENCOUNTER — Telehealth: Payer: Self-pay | Admitting: Physical Therapy

## 2021-07-22 ENCOUNTER — Encounter: Payer: Self-pay | Admitting: Physical Therapy

## 2021-07-22 ENCOUNTER — Ambulatory Visit: Payer: Medicare Other | Admitting: Physical Therapy

## 2021-07-22 DIAGNOSIS — M6281 Muscle weakness (generalized): Secondary | ICD-10-CM

## 2021-07-22 DIAGNOSIS — R278 Other lack of coordination: Secondary | ICD-10-CM | POA: Diagnosis not present

## 2021-07-22 DIAGNOSIS — R2681 Unsteadiness on feet: Secondary | ICD-10-CM

## 2021-07-22 DIAGNOSIS — R2689 Other abnormalities of gait and mobility: Secondary | ICD-10-CM | POA: Diagnosis not present

## 2021-07-22 DIAGNOSIS — R29818 Other symptoms and signs involving the nervous system: Secondary | ICD-10-CM

## 2021-07-22 DIAGNOSIS — R270 Ataxia, unspecified: Secondary | ICD-10-CM | POA: Diagnosis not present

## 2021-07-22 NOTE — Telephone Encounter (Signed)
Dr. Krista Blue, ? ?Joyce Brown is being seen in OPPT for cerebellar atrophy per your referral.The patient would benefit from OT evaluation for difficulty with fine motor movement.   ?If you agree, please place an order in [facility]que in [facility] or fax the order to [phone]. ? ?Thank you, ?Janene Harvey, PT, DPT ?07/22/21 10:54 AM ? ? ?[facility] ?[address] ?[place], [place]  [address] ?Phone:  [phone] ?Fax:  [phone] ? ?

## 2021-07-22 NOTE — Addendum Note (Signed)
Addended by: Marcial Pacas on: 07/22/2021 03:10 PM ? ? Modules accepted: Orders ? ?

## 2021-07-22 NOTE — Telephone Encounter (Signed)
Orders Placed This Encounter  ?Procedures  ? Ambulatory referral to Occupational Therapy  ? ?   ?

## 2021-07-27 ENCOUNTER — Ambulatory Visit: Payer: Medicare Other | Admitting: Physical Therapy

## 2021-07-27 ENCOUNTER — Encounter: Payer: Self-pay | Admitting: Physical Therapy

## 2021-07-27 DIAGNOSIS — R278 Other lack of coordination: Secondary | ICD-10-CM | POA: Diagnosis not present

## 2021-07-27 DIAGNOSIS — M6281 Muscle weakness (generalized): Secondary | ICD-10-CM | POA: Diagnosis not present

## 2021-07-27 DIAGNOSIS — R29818 Other symptoms and signs involving the nervous system: Secondary | ICD-10-CM | POA: Diagnosis not present

## 2021-07-27 DIAGNOSIS — R270 Ataxia, unspecified: Secondary | ICD-10-CM | POA: Diagnosis not present

## 2021-07-27 DIAGNOSIS — R2681 Unsteadiness on feet: Secondary | ICD-10-CM

## 2021-07-27 DIAGNOSIS — R2689 Other abnormalities of gait and mobility: Secondary | ICD-10-CM | POA: Diagnosis not present

## 2021-07-27 NOTE — Patient Instructions (Signed)
Access Code: [email or web address] ?URL: [email or web address] ?Date: 07/27/2021 ?Prepared by: [facility] ? ?Exercises ?- Seated Gluteal Sets  - 1 x daily - 7 x weekly - 2 sets - 10 reps - 3 sec hold ?- Seated Active Hip Flexion  - 1 x daily - 7 x weekly - 2 sets - 10 reps - 5 sec hold ?- Seated Shoulder Row with Anchored Resistance  - 1 x daily - 5 x weekly - 2 sets - 10 reps ?- Seated Shoulder Extension and Scapular Retraction with Resistance  - 1 x daily - 5 x weekly - 2 sets - 10 reps ?- Modified Thomas Stretch  - 1 x daily - 5 x weekly - 2 sets - 30 sec hold ?- Sit to Stand with Counter Support  - 1 x daily - 5 x weekly - 2 sets - 5 reps ?- Lower Trunk Rotations  - 1-2 x daily - 7 x weekly - 1 sets - 5-10 reps ?- Hooklying Isometric Clamshell  - 1 x daily - 5 x weekly - 1-2 sets - 10 reps ?

## 2021-07-27 NOTE — Therapy (Signed)
?OUTPATIENT PHYSICAL THERAPY NEURO TREATMENT ? ? ?Patient Name: Joyce Brown ?MRN: [id number] ?DOB:03/16/1950, 71 y.o., female ?Today's Date: 07/27/2021 ? ?PCP: Talbert Cage, MD ?REFERRING PROVIDER: Marcial Pacas, MD ? ?END OF SESSION:  ? PT End of Session - 07/27/21 0844   ? ? Visit Number 3   ? Number of Visits 18   ? Date for PT Re-Evaluation 09/14/21   ? Authorization Type Medicare/AARP   ? PT Start Time 0840   ? PT Stop Time [phone]   ? PT Time Calculation (min) 43 min   ? Equipment Utilized During Treatment Gait belt   ? Activity Tolerance Patient tolerated treatment well   ? Behavior During Therapy [facility] for tasks assessed/performed   ? ?  ?  ? ?  ? ? ? ?Past Medical History:  ?Diagnosis Date  ? Cerebellar ataxia ([place])   ? Diabetes mellitus without complication ([place])   ? Diabetic neuropathy ([place]) 12/09/2020  ? Foot fracture 2022  ? Gait abnormality 11/07/2018  ? Hyperlipidemia   ? Pulmonary embolism ([place])   ? Sleep apnea   ? Spinal stenosis   ? ?Past Surgical History:  ?Procedure Laterality Date  ? ABDOMINAL HYSTERECTOMY    ? ADENOIDECTOMY    ? ANKLE SURGERY Left   ? BILATERAL CARPAL TUNNEL RELEASE    ? BREAST BIOPSY Left 2019  ? benign  ? ELBOW SURGERY    ? GASTRIC BYPASS  2014  ? HAND NEUROPLASTY    ? radio denervation  05/21/2020  ? TONSILLECTOMY    ? ?Patient Active Problem List  ? Diagnosis Date Noted  ? H/O bariatric surgery 06/29/2021  ? Bilateral tennis elbow 06/15/2021  ? Pharyngitis 04/30/2021  ? 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 Abscess 03/05/2021  ? Type 1 diabetes mellitus with diabetic polyneuropathy ([place]) 12/25/2020  ? Urinary frequency 12/16/2020  ? Diabetic neuropathy ([place]) 12/09/2020  ? Myofascial pain 09/12/2020  ? Wheelchair dependence 09/12/2020  ? Dyslipidemia 06/19/2020  ? Spondylosis without myelopathy or radiculopathy, lumbar region 04/27/2020  ? Sensorineural hearing loss (SNHL) of both ears 08/21/2019  ? Tinnitus, bilateral 06/27/2019  ? FH: ovarian cancer in first  degree relative 11/16/2018  ? Hypercholesteremia 11/15/2018  ? Gait abnormality 11/07/2018  ? Pain of left hip joint 05/18/2018  ? Chronic midline low back pain with bilateral sciatica 05/18/2018  ? Breast calcifications 12/28/2017  ? Fibromyalgia 12/06/2017  ? History of gastric bypass 12/06/2017  ? Spinal stenosis 12/06/2017  ? Hypokalemia 12/06/2017  ? Asthma 12/06/2017  ? Cerebellar ataxia ([place])   ? Type I diabetes mellitus with complication, uncontrolled   ? Serum albumin decreased 11/10/2017  ? Vitamin B6 deficiency 11/10/2017  ? 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  ? Hypoglycemia due to type 1 diabetes mellitus ([place]) 06/20/2016  ? Hyperparathyroidism ([place]) 10/29/2014  ? Increased PTH level 10/01/2014  ? Obstructive sleep apnea on CPAP 10/01/2014  ? Non-organic enuresis 05/28/2014  ? Urge incontinence of urine 05/28/2014  ? History of vaginal hysterectomy 05/24/2014  ? History of hypertension 04/01/2014  ? 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 Pain of lumbar facet joint 04/11/2013  ? Mild persistent asthma without complication 61/95/0932  ? Carpal tunnel syndrome 01/09/2013  ? Lichen planus 67/02/4579  ? Adjustment disorder with mixed anxiety and depressed mood 05/11/2012  ? Degeneration of lumbar intervertebral disc 04/26/2012  ? Pain in joint involving ankle and foot 04/26/2012  ? Osteoarthritis of hand 05/24/2011  ? Leukopenia 09/25/2009  ? ? ?ONSET DATE: 07/06/2021 (  MD referral); initial symptoms 2007 ? ?REFERRING DIAG: G11.9 (ICD-10-CM) - Cerebellar ataxia ([place]) R26.9 (ICD-10-CM) - Gait abnormality  ? ?THERAPY DIAG:  ?Muscle weakness (generalized) ? ?Unsteadiness on feet ? ?Other symptoms and signs involving the nervous system ? ?SUBJECTIVE:  ?                                                                                                                                                                                           ? ?SUBJECTIVE STATEMENT: ?Has been working the new  stretches and they are doing well.  Felt the stretch a lot more in the left leg. ? ?Pt accompanied by: self ? ?PERTINENT HISTORY: Has Cerebellar atrophy with ataxia and also chronic low back pain s/p radiofrequency ablation 3/22; Has had trigger point injections for L tennis elbow. Has has Myofascial pain and fibromyalgia.  lumbar spondylosis, chronic LBP ? ?PMH:  DM, neuropathy, closed foot fracture fall of 2022, HLD, PE, spinal stenosis, tennis elbow ? ?PAIN:  ?Are you having pain? Yes: NPRS scale: 5/10 ?Pain location: L elbow, forearm ?Pain description: sharp, achy ?Aggravating factors: opening objects, extending fingers ?Relieving factors: trigger point injections ?*Pt to have OT eval on 07/29/2021 ? ?PRECAUTIONS: Fall ? -hx of Type 1 DM, carries glucose tablets with her; follows the "Rule of 15" ? ?PATIENT GOALS Pt's goals for therapy are to increase strength and mobility.   ? ? ? ? ? ?TODAY'S TREATMENT: 07/27/21 ?Activity Comments  ?Mod thomas stretch 30" each with foot on PT's foot Reviewed and pt return demo understanding; reports using yoga blocks for foot support at home  ?Supine trunk rotation stretch, 8 reps  Cues to start with rocking, then cues for form for stretch  ?Bridging x 10 reps Cues for core activation  ?Bridging with ball squeezes x 10 reps Cues for technique, core activation  ?Hip adductor ball squeezes x 10   ?Hooklying hip abduction with red theraband, x10, then x 5 Cues for core activation and technique for smooth, controlled motions  ?REview of sit<>stand x 5 reps from w/c>counter stand Cues for technique-pt able to verbalize and self-correct  ?Standing glut sets 3 reps then 5 reps   ?Partial sit to stand-w/c pushups 2 x 5 reps Cues and min assist  ? ? ? ?Previous session,07/22/2021, Merrilee Jansky Score: 19/56 ?  ?PATIENT EDUCATION: ?Education details: update to HEP  ?Person educated: Patient ?Education method: Explanation, Demonstration, Tactile cues, Verbal cues, and Handouts ?Education  comprehension: verbalized understanding and returned demonstration ? ?Access Code: [email or web address] ?URL: [email or web address] ?Date: 07/27/2021 ?Prepared by: [facility] ? ?Exercises ?- Seated Gluteal  Sets  - 1 x daily - 7 x weekly - 2 sets - 10 reps - 3 sec hold ?- Seated Active Hip Flexion  - 1 x daily - 7 x weekly - 2 sets - 10 reps - 5 sec hold ?- Seated Shoulder Row with Anchored Resistance  - 1 x daily - 5 x weekly - 2 sets - 10 reps ?- Seated Shoulder Extension and Scapular Retraction with Resistance  - 1 x daily - 5 x weekly - 2 sets - 10 reps ?- Modified Thomas Stretch  - 1 x daily - 5 x weekly - 2 sets - 30 sec hold ?- Sit to Stand with Counter Support  - 1 x daily - 5 x weekly - 2 sets - 5 reps ? ?Added 07/27/2021 ?- Lower Trunk Rotations  - 1-2 x daily - 7 x weekly - 1 sets - 5-10 reps ?- Hooklying Isometric Clamshell  - 1 x daily - 5 x weekly - 1-2 sets - 10 reps ? ?OBJECTIVE: measures were taken at time of initial evaluation unless otherwise specified ? ?MMT:   ? ?MMT Right ?07/27/2021 Left ?07/27/2021  ?Hip flexion 5/5 5/5  ?Hip extension    ?Hip abduction 4/5 4/5  ?Hip adduction 4/5 4/5  ?Hip internal rotation    ?Hip external rotation    ?Knee flexion 5/5 5/5  ?Knee extension 5/5 5/5  ?Ankle dorsiflexion 3+/5 3+/5  ?Ankle plantarflexion    ?Ankle inversion    ?Ankle eversion    ?(Blank rows = not tested) ? ?TRANSFERS: ?Assistive device utilized: Environmental consultant - 2 wheeled  ?Sit to stand: SBA ?Stand to sit: SBA ?Pt stands at RW x 1:26, prior to needing to sit. ? ?GAIT: ?Gait pattern: step to pattern, step through pattern, ataxic, and wide BOS ?Distance walked: 20 ft ?Assistive device utilized: Environmental consultant - 2 wheeled ?Level of assistance: CGA and Min A ?Comments: With TUG test.  Pt reports difficulty with ramp/incline negotiation. ? ?FUNCTIONAL TESTs:  ?Timed up and go (TUG): 50.34 sec ? ? ?HOME EXERCISE PROGRAM: ?MedBridge Access Code: [email or web address] ?URL:  [email or web address] ?Date: 07/20/2021 ?Prepared by: [facility] ? ?Exercises ?- Seated Gluteal Sets  - 1 x daily - 7 x weekly - 2 sets - 10 reps - 3 sec hold ?- Seated Active Hip Flexion  -

## 2021-07-29 ENCOUNTER — Encounter: Payer: Self-pay | Admitting: Physical Therapy

## 2021-07-29 ENCOUNTER — Ambulatory Visit: Payer: Medicare Other | Admitting: Physical Therapy

## 2021-07-29 ENCOUNTER — Ambulatory Visit: Payer: Medicare Other | Admitting: Occupational Therapy

## 2021-07-29 DIAGNOSIS — M6281 Muscle weakness (generalized): Secondary | ICD-10-CM | POA: Diagnosis not present

## 2021-07-29 DIAGNOSIS — R2681 Unsteadiness on feet: Secondary | ICD-10-CM

## 2021-07-29 DIAGNOSIS — R2689 Other abnormalities of gait and mobility: Secondary | ICD-10-CM

## 2021-07-29 DIAGNOSIS — M7712 Lateral epicondylitis, left elbow: Secondary | ICD-10-CM

## 2021-07-29 DIAGNOSIS — M7711 Lateral epicondylitis, right elbow: Secondary | ICD-10-CM

## 2021-07-29 DIAGNOSIS — R41840 Attention and concentration deficit: Secondary | ICD-10-CM

## 2021-07-29 DIAGNOSIS — R278 Other lack of coordination: Secondary | ICD-10-CM

## 2021-07-29 DIAGNOSIS — R270 Ataxia, unspecified: Secondary | ICD-10-CM

## 2021-07-29 DIAGNOSIS — R29818 Other symptoms and signs involving the nervous system: Secondary | ICD-10-CM

## 2021-07-29 NOTE — Therapy (Signed)
OUTPATIENT OCCUPATIONAL THERAPY NEURO EVALUATION  Patient Name: Joyce Brown MRN: [id number] DOB:June 09, 1950, 71 y.o., female Today's Date: 07/29/2021  PCP: Talbert Cage, MD REFERRING PROVIDER: Marcial Pacas, MD   OT End of Session - 07/29/21 [phone]     Visit Number 1    Number of Visits 17    Date for OT Re-Evaluation 10/07/21    Authorization Type Medicare    Progress Note Due on Visit 10    OT Start Time 0915    OT Stop Time 0958    OT Time Calculation (min) 43 min    Behavior During Therapy [facility] for tasks assessed/performed            Past Medical History:  Diagnosis Date   Cerebellar ataxia ([place])    Diabetes mellitus without complication ([place])    Diabetic neuropathy ([place]) 12/09/2020   Foot fracture 2022   Gait abnormality 11/07/2018   Hyperlipidemia    Pulmonary embolism ([place])    Sleep apnea    Spinal stenosis    Past Surgical History:  Procedure Laterality Date   ABDOMINAL HYSTERECTOMY     ADENOIDECTOMY     ANKLE SURGERY Left    BILATERAL CARPAL TUNNEL RELEASE     BREAST BIOPSY Left 2019   benign   ELBOW SURGERY     GASTRIC BYPASS  2014   HAND NEUROPLASTY     radio denervation  05/21/2020   TONSILLECTOMY     Patient Active Problem List   Diagnosis Date Noted   H/O bariatric surgery 06/29/2021   Bilateral tennis elbow 06/15/2021   Pharyngitis 04/30/2021   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   Abscess 03/05/2021   Type 1 diabetes mellitus with diabetic polyneuropathy ([place]) 12/25/2020   Urinary frequency 12/16/2020   Diabetic neuropathy ([place]) 12/09/2020   Myofascial pain 09/12/2020   Wheelchair dependence 09/12/2020   Dyslipidemia 06/19/2020   Spondylosis without myelopathy or radiculopathy, lumbar region 04/27/2020   Sensorineural hearing loss (SNHL) of both ears 08/21/2019   Tinnitus, bilateral 06/27/2019   FH: ovarian cancer in first degree relative 11/16/2018   Hypercholesteremia 11/15/2018   Gait abnormality 11/07/2018    Pain of left hip joint 05/18/2018   Chronic midline low back pain with bilateral sciatica 05/18/2018   Breast calcifications 12/28/2017   Fibromyalgia 12/06/2017   History of gastric bypass 12/06/2017   Spinal stenosis 12/06/2017   Hypokalemia 12/06/2017   Asthma 12/06/2017   Cerebellar ataxia ([place])    Type I diabetes mellitus with complication, uncontrolled    Serum albumin decreased 11/10/2017   Vitamin B6 deficiency 11/10/2017   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   Hypoglycemia due to type 1 diabetes mellitus ([place]) 06/20/2016   Hyperparathyroidism ([place]) 10/29/2014   Increased PTH level 10/01/2014   Obstructive sleep apnea on CPAP 10/01/2014   Non-organic enuresis 05/28/2014   Urge incontinence of urine 05/28/2014   History of vaginal hysterectomy 05/24/2014   History of hypertension 04/01/2014   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   Pain of lumbar facet joint 04/11/2013   Mild persistent asthma without complication 56/21/3086   Carpal tunnel syndrome 57/84/6962   Lichen planus 95/28/4132   Adjustment disorder with mixed anxiety and depressed mood 05/11/2012   Degeneration of lumbar intervertebral disc 04/26/2012   Pain in joint involving ankle and foot 04/26/2012   Osteoarthritis of hand 05/24/2011   Leukopenia 09/25/2009    ONSET DATE: 07/22/21 (referral date)  REFERRING DIAG:  R26.89 (ICD-10-CM) - Other abnormalities of gait and mobility R29.818 (  ICD-10-CM) - Other symptoms and signs involving the nervous system   THERAPY DIAG:  Ataxia  Other abnormalities of gait and mobility  Lateral epicondylitis, left elbow  Lateral epicondylitis of right elbow  Attention and concentration deficit  Other lack of coordination  SUBJECTIVE:   SUBJECTIVE STATEMENT: Pt arrives to OP OT w/ primary concerns of decreased Luce, Jessie, and dexterity due to cerebellar atrophy, as well as pain and decreased strength 2/2 bilateral tennis elbow. Pt reports both  decreased coordination and symptoms of tennis elbow are worse in the LUE vs. RUE. Pt has been wearing a compression strap on L side and reports a mild improvement, but is looking forward to receiving some stretches and exercises in therapy. 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 Yes: NPRS scale: 5/10 Pain location: slightly distal to L elbow Pain description: neuropathic pain Aggravating factors: movement, specific positions (supination, reaching to the left) Relieving factors: pain patches, ice pack  FALLS: Has patient fallen in last 6 months? No  LIVING ENVIRONMENT: Lives with: lives alone Lives in: House/apartment Stairs: No Has following equipment at home: Walker - 2 wheeled and adjustable bed  PLOF: Leisure: reading (likes mysteries), traveling to museums, Health visitor  PATIENT GOALS Stretches for tennis elbow; increase dexterity  OBJECTIVE:   HAND DOMINANCE: Right  ADLs: Overall ADLs: Reports Mod I w/ all BADLs Transfers/ambulation related to ADLs: w/c for in-home mobility; RW for transfers in/out of bathroom Equipment: Transfer tub bench, Grab bars, Reacher, and elevated toilet seat  IADLs: Shopping: w/c mobility to and from grocery; lives very close to a [employer] housekeeping: Able to complete light tasks; assist for heavier housekeeping Meal Prep: Microwave  meal prep, simple snack prep Community mobility: Uses [employer] [place] paratransit services; has not driven in > 10 years Medication management: Independent Financial management: Independent Handwriting:  Unable to be assessed at evaluation; to be further assessed prn  MOBILITY STATUS:  w/c user  ACTIVITY TOLERANCE: Activity tolerance: Reports mild decreased activity tolerance  UE ROM    BUEs grossly WFL (shoulder, elbow, forearm/wrist, hand)  UE MMT:    Unable to assess considering pain; elbow, forearm, and wrists to be further assessed  HAND FUNCTION: Grip strength: Right: 58 lbs; Left: 35 lbs  COORDINATION: Finger Nose Finger test: Impaired; L side w/ incr dysmetria 9 Hole Peg test: Right: 38.5 sec; Left: 54.9 sec Box and Blocks:  Right 34 blocks, Left 26 blocks  SENSATION: WFL, per pt report  COGNITION: Overall cognitive status: Within functional limits for tasks assessed  VISION: Subjective report: Reports some horizontal double vision Baseline vision: Wears glasses all the time Visual history: retinopathy  VISION ASSESSMENT: Tracking/Visual pursuits: Able to track in all quadrants, but w/ decreased smoothness Saccades: decreased speed of saccadic movements  Patient has difficulty with following activities due to visual impairments: Reading small print  OBSERVATIONS: Mild hypotonicity observed in BUEs during evaluation tasks (L more significant than R)  TODAY'S TREATMENT: Gentle wrist extension stretch w/ forearm pronated and elbow extended x5; OT provided modeling and verbal cues for positioning. No adverse reaction or worsening of symptoms reported or observed.   PATIENT EDUCATION: Education details: Education provided on role and purpose of OT, as well as potential interventions and goals for therapy based on initial evaluation findings. Person educated: Patient Education method: Explanation Education comprehension: verbalized understanding   HOME EXERCISE  PROGRAM: TBD   GOALS: Goals reviewed with patient? Yes  SHORT TERM GOALS: Target date: 08/28/2021  STG  Status:  1 Pt will verbalize understanding of at least 2 precautions and positional considerations related to worsening of lateral epicondylitis symptoms Baseline: Decreased knowledge of precautions Initial  2 Pt will demonstrate understanding of initial HEP designed for increased BUE GMC, ROM, and stretching Baseline: No HEP at this time Initial  3 Pt will demonstrate understanding of at least 1 visual compensatory strategy to reduce functional impact of visual perceptual limitations Baseline: Reports diplopia; no currently compensatory strategies Initial    LONG TERM GOALS: Target date: 09/25/2021  LTG  Status:  1 Pt will demonstrate understanding of initial HEP designed for increased BUE GMC, ROM, and stretching Baseline: No HEP at this time Initial  2 Pt will improve L hand grip strength by at least 9 lbs w/ pain less than 2/10 by discharge Baseline: 35 lbs (RUE 58 lbs) Initial  3 Pt will increase Box and Blocks score bilaterally by at least 3 blocks to demonstrate improved GMC Baseline: 34 w/ RUE; 26 w/ LUE Initial  4 Pt will be able to grip and lift a lightweight object (e.g., coffee mug) w/out pain in L elbow Baseline: Pain w/ grasp and lifting Initial  5 Pt will decrease time to complete 9-HPT by at least 4 seconds to indicate improved control and coordination for functional bilateral FM tasks Baseline: 54.9 sec (RUE 38.5 sec) Initial     ASSESSMENT:  CLINICAL IMPRESSION: Pt is a 71 y/o female who presents to OP OT due to decreased functional use of BUEs 2/2 cerebellar atrophy w/ ataxia (L more impacted than R) and bilateral lateral epicondylitis. Additional PMH includes chronic low back pain, myofascial pain and fibromyalgia, T1DM w/ neuropathy and reinopathy, asthma, OSA on CPAP, and hx of HTN, PE, foot fracture in 2022, medial epicondylectomy (approx '98), and bilateral  carpal tunnel release. Pt currently lives alone in a single level apartment and is largely independent w/ BADLs and light IADLs. Pt will benefit from skilled occupational therapy services to address strength and coordination, pain management, altered sensation, functional mobility, gross and FM control, visual-perception, introduction of compensatory strategies/AE prn, and implementation of an HEP, to improve participation and safety during ADLs and decrease functional impact of symptoms related to bilateral tennis elbow.  PERFORMANCE DEFICITS in functional skills including ADLs, IADLs, coordination, dexterity, sensation, tone, ROM, strength, pain, FMC, GMC, mobility, balance, body mechanics, decreased knowledge of precautions, decreased knowledge of use of DME, vision, and UE functional use.   IMPAIRMENTS are limiting patient from ADLs, IADLs, rest and sleep, leisure, and social participation.   COMORBIDITIES has co-morbidities such as DM w/ neuropathy and retinopathy, chronic low back pain, asthma, and myofascial pain  that affects occupational performance. Patient will benefit from skilled OT to address above impairments and improve overall function.  MODIFICATION OR ASSISTANCE TO COMPLETE EVALUATION: Min-Moderate modification of tasks or assist with assess necessary to complete an evaluation.  OT OCCUPATIONAL PROFILE AND HISTORY: Detailed assessment: Review of records and  additional review of physical, cognitive, psychosocial history related to current functional performance.  CLINICAL DECISION MAKING: Moderate - several treatment options, min-mod task modification necessary  REHAB POTENTIAL: Good  EVALUATION COMPLEXITY: High   PLAN: OT FREQUENCY: 2x/week  OT DURATION: 8 weeks  PLANNED INTERVENTIONS: self care/ADL training, therapeutic exercise, therapeutic activity, neuromuscular re-education, manual therapy, passive range of motion, functional mobility training, aquatic therapy,  splinting, ultrasound, iontophoresis, compression bandaging, moist heat, cryotherapy, patient/family education, visual/perceptual remediation/compensation, and DME and/or AE instructions  RECOMMENDED OTHER SERVICES: Currently receiving PT services at this location  CONSULTED AND AGREED WITH PLAN OF CARE: Patient  PLAN FOR NEXT SESSION: Initiate protocol for lateral epicondylitis   Kathrine Cords, MSOT, OTR/L  07/29/2021, 5:34 PM

## 2021-07-29 NOTE — Therapy (Signed)
?OUTPATIENT PHYSICAL THERAPY NEURO TREATMENT ? ? ?Patient Name: Joyce Brown ?MRN: [id number] ?DOB:1950-11-13, 71 y.o., female ?Today's Date: 07/29/2021 ? ?PCP: Talbert Cage, MD ?REFERRING PROVIDER: Marcial Pacas, MD ? ?END OF SESSION:  ? PT End of Session - 07/29/21 0825   ? ? Visit Number 4   ? Number of Visits 18   ? Date for PT Re-Evaluation 09/14/21   ? Authorization Type Medicare/AARP   ? PT Start Time [phone]   ? PT Stop Time 6767   ? PT Time Calculation (min) 40 min   ? Equipment Utilized During Treatment Gait belt   ? Activity Tolerance Patient tolerated treatment well   ? Behavior During Therapy [facility] for tasks assessed/performed   ? ?  ?  ? ?  ? ? ? ?Past Medical History:  ?Diagnosis Date  ? Cerebellar ataxia ([place])   ? Diabetes mellitus without complication ([place])   ? Diabetic neuropathy ([place]) 12/09/2020  ? Foot fracture 2022  ? Gait abnormality 11/07/2018  ? Hyperlipidemia   ? Pulmonary embolism ([place])   ? Sleep apnea   ? Spinal stenosis   ? ?Past Surgical History:  ?Procedure Laterality Date  ? ABDOMINAL HYSTERECTOMY    ? ADENOIDECTOMY    ? ANKLE SURGERY Left   ? BILATERAL CARPAL TUNNEL RELEASE    ? BREAST BIOPSY Left 2019  ? benign  ? ELBOW SURGERY    ? GASTRIC BYPASS  2014  ? HAND NEUROPLASTY    ? radio denervation  05/21/2020  ? TONSILLECTOMY    ? ?Patient Active Problem List  ? Diagnosis Date Noted  ? H/O bariatric surgery 06/29/2021  ? Bilateral tennis elbow 06/15/2021  ? Pharyngitis 04/30/2021  ? 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 Abscess 03/05/2021  ? Type 1 diabetes mellitus with diabetic polyneuropathy ([place]) 12/25/2020  ? Urinary frequency 12/16/2020  ? Diabetic neuropathy ([place]) 12/09/2020  ? Myofascial pain 09/12/2020  ? Wheelchair dependence 09/12/2020  ? Dyslipidemia 06/19/2020  ? Spondylosis without myelopathy or radiculopathy, lumbar region 04/27/2020  ? Sensorineural hearing loss (SNHL) of both ears 08/21/2019  ? Tinnitus, bilateral 06/27/2019  ? FH: ovarian cancer in first  degree relative 11/16/2018  ? Hypercholesteremia 11/15/2018  ? Gait abnormality 11/07/2018  ? Pain of left hip joint 05/18/2018  ? Chronic midline low back pain with bilateral sciatica 05/18/2018  ? Breast calcifications 12/28/2017  ? Fibromyalgia 12/06/2017  ? History of gastric bypass 12/06/2017  ? Spinal stenosis 12/06/2017  ? Hypokalemia 12/06/2017  ? Asthma 12/06/2017  ? Cerebellar ataxia ([place])   ? Type I diabetes mellitus with complication, uncontrolled   ? Serum albumin decreased 11/10/2017  ? Vitamin B6 deficiency 11/10/2017  ? 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  ? Hypoglycemia due to type 1 diabetes mellitus ([place]) 06/20/2016  ? Hyperparathyroidism ([place]) 10/29/2014  ? Increased PTH level 10/01/2014  ? Obstructive sleep apnea on CPAP 10/01/2014  ? Non-organic enuresis 05/28/2014  ? Urge incontinence of urine 05/28/2014  ? History of vaginal hysterectomy 05/24/2014  ? History of hypertension 04/01/2014  ? 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 Pain of lumbar facet joint 04/11/2013  ? Mild persistent asthma without complication 20/94/7096  ? Carpal tunnel syndrome 01/09/2013  ? Lichen planus 28/36/6294  ? Adjustment disorder with mixed anxiety and depressed mood 05/11/2012  ? Degeneration of lumbar intervertebral disc 04/26/2012  ? Pain in joint involving ankle and foot 04/26/2012  ? Osteoarthritis of hand 05/24/2011  ? Leukopenia 09/25/2009  ? ? ?ONSET DATE: 07/06/2021 (  MD referral); initial symptoms 2007 ? ?REFERRING DIAG: G11.9 (ICD-10-CM) - Cerebellar ataxia ([place]) R26.9 (ICD-10-CM) - Gait abnormality  ? ?THERAPY DIAG:  ?Muscle weakness (generalized) ? ?Unsteadiness on feet ? ?Other symptoms and signs involving the nervous system ? ?Other abnormalities of gait and mobility ? ?SUBJECTIVE:  ?                                                                                                                                                                                           ? ?SUBJECTIVE  STATEMENT: ?I have to use an additional towel under the yoga block to make sure the stretch is not too much.  Brought in info on spinal ablation that I had with Dr. Ernestina Patches (March 2022). ? ?Pt accompanied by: self ? ?PERTINENT HISTORY: Has Cerebellar atrophy with ataxia and also chronic low back pain s/p radiofrequency ablation 3/22; Has had trigger point injections for L tennis elbow. Has has Myofascial pain and fibromyalgia.  lumbar spondylosis, chronic LBP ? ?PMH:  DM, neuropathy, closed foot fracture fall of 2022, HLD, PE, spinal stenosis, tennis elbow ? ?PAIN:  ?Are you having pain? Yes: NPRS scale: 5/10 ?Pain location: L elbow, forearm ?Pain description: sharp, achy ?Aggravating factors: opening objects, extending fingers ?Relieving factors: trigger point injections ?*Pt to have OT eval on 07/29/2021 ? ?PRECAUTIONS: Fall ? -hx of Type 1 DM, carries glucose tablets with her; follows the "Rule of 15" ? ?PATIENT GOALS Pt's goals for therapy are to increase strength and mobility.   ? ? ? ? ? ?TODAY'S TREATMENT: 07/29/21 ? ?Reviewed HEP additions from 07/27/2021, with pt return demo understanding ?- Lower Trunk Rotations  - 1-2 x daily - 7 x weekly - 1 sets - 5-10 reps ?- Hooklying Isometric Clamshell  - 1 x daily - 5 x weekly - 1-2 sets - 10 reps ? ?Activity Comments  ?   ?Hooklying marching 2# weights, 2 x 10 reps Cues for core activation, controlled movements  ?Bridging x 10 reps Cues for core activation  ?Bridging with ball squeezes x 10 reps Cues for technique, core activation  ?Hip adductor ball squeezes x 10   ?SEated LAQ, 2 x 10 reps Added 2# weight 2nd set  ?   ?Sit<>stand 2 x 5 reps from elevated mat surface Cues for technique  ?Standing quad/glut sets x 10    ?Standing partial minisquats x 5   ?Partial sit to stand-w/c pushups 2 x 5 reps Cues and min assist-improves with repetition  ? ? ? ?Berg Score: 19/56 (07/22/2021) ?  ?PATIENT EDUCATION: ?Education details: Educated in continuing pacing, control of  sit<>stand and exercise motions ?  Person educated: Patient ?Education method: Explanation, Demonstration, Tactile cues, Verbal cues, and Handouts ?Education comprehension: verbalized understanding and returned demonstration ? ? ?OBJECTIVE: measures were taken at time of initial evaluation unless otherwise specified ? ?MMT:   ? ?MMT Right ?07/29/2021 Left ?07/29/2021  ?Hip flexion 5/5 5/5  ?Hip extension    ?Hip abduction 4/5 4/5  ?Hip adduction 4/5 4/5  ?Hip internal rotation    ?Hip external rotation    ?Knee flexion 5/5 5/5  ?Knee extension 5/5 5/5  ?Ankle dorsiflexion 3+/5 3+/5  ?Ankle plantarflexion    ?Ankle inversion    ?Ankle eversion    ?(Blank rows = not tested) ? ?TRANSFERS: ?Assistive device utilized: Environmental consultant - 2 wheeled  ?Sit to stand: SBA ?Stand to sit: SBA ?Pt stands at RW x 1:26, prior to needing to sit. ? ?GAIT: ?Gait pattern: step to pattern, step through pattern, ataxic, and wide BOS ?Distance walked: 20 ft ?Assistive device utilized: Environmental consultant - 2 wheeled ?Level of assistance: CGA and Min A ?Comments: With TUG test.  Pt reports difficulty with ramp/incline negotiation. ? ?FUNCTIONAL TESTs:  ?Timed up and go (TUG): 50.34 sec ? ? ?HOME EXERCISE PROGRAM: ?MedBridge Access Code: [email or web address] ?URL: [email or web address] ?Date: 07/20/2021 ?Prepared by: [facility] ? ? ? ? ? ?GOALS: ?Goals reviewed with patient? Yes ? ?SHORT TERM GOALS: Target date: 08/17/2021 ? ?Pt will be independent with HEP for improved strength, standing, transfers, balance. ?Baseline: ?Goal status: IN PROGRESS ? ?2.  Pt perform standing at counter x 3 minutes with UE support for improved standing tolerance. ?Baseline: 1 min, 26 sec at eval ?Goal status: IN PROGRESS ? ? ? ?LONG TERM GOALS: Target date: 09/14/2021 ? ? ?Pt will be independent with HEP for improved strength, balance, transfers, and gait. ?Baseline: initiated ?Goal status: IN PROGRESS ? ?2.  Pt will perform at least 5 minutes of  standing at counter/RW for improved standing balance and performance with ADLs. ?Baseline: unable ?Goal status: IN PROGRESS ? ?3.  Pt will improve TUG score to less than or equal to 35 sec for decreased fall risk. ?

## 2021-07-31 ENCOUNTER — Encounter: Payer: Self-pay | Admitting: Internal Medicine

## 2021-07-31 DIAGNOSIS — R278 Other lack of coordination: Secondary | ICD-10-CM | POA: Diagnosis not present

## 2021-07-31 DIAGNOSIS — N3941 Urge incontinence: Secondary | ICD-10-CM | POA: Diagnosis not present

## 2021-07-31 DIAGNOSIS — M6281 Muscle weakness (generalized): Secondary | ICD-10-CM | POA: Diagnosis not present

## 2021-08-03 ENCOUNTER — Encounter: Payer: Self-pay | Admitting: Physical Therapy

## 2021-08-03 ENCOUNTER — Ambulatory Visit: Payer: Medicare Other | Admitting: Physical Therapy

## 2021-08-03 DIAGNOSIS — R29818 Other symptoms and signs involving the nervous system: Secondary | ICD-10-CM | POA: Diagnosis not present

## 2021-08-03 DIAGNOSIS — M6281 Muscle weakness (generalized): Secondary | ICD-10-CM

## 2021-08-03 DIAGNOSIS — R2681 Unsteadiness on feet: Secondary | ICD-10-CM

## 2021-08-03 DIAGNOSIS — R270 Ataxia, unspecified: Secondary | ICD-10-CM | POA: Diagnosis not present

## 2021-08-03 DIAGNOSIS — R278 Other lack of coordination: Secondary | ICD-10-CM | POA: Diagnosis not present

## 2021-08-03 DIAGNOSIS — R2689 Other abnormalities of gait and mobility: Secondary | ICD-10-CM | POA: Diagnosis not present

## 2021-08-03 NOTE — Therapy (Signed)
OUTPATIENT PHYSICAL THERAPY NEURO TREATMENT   Patient Name: Joyce Brown MRN: [id number] DOB:18-Sep-1950, 71 y.o., female Today's Date: 08/03/2021  PCP: Talbert Cage, MD REFERRING PROVIDER: Marcial Pacas, MD  END OF SESSION:   PT End of Session - 08/03/21 0837     Visit Number 5    Number of Visits 18    Date for PT Re-Evaluation 09/14/21    Authorization Type Medicare/AARP    PT Start Time 0842    PT Stop Time 0924    PT Time Calculation (min) 42 min    Equipment Utilized During Treatment Gait belt    Activity Tolerance Patient tolerated treatment well    Behavior During Therapy WFL for tasks assessed/performed              Past Medical History:  Diagnosis Date   Cerebellar ataxia ([place])    Diabetes mellitus without complication ([place])    Diabetic neuropathy ([place]) 12/09/2020   Foot fracture 2022   Gait abnormality 11/07/2018   Hyperlipidemia    Pulmonary embolism ([place])    Sleep apnea    Spinal stenosis    Past Surgical History:  Procedure Laterality Date   ABDOMINAL HYSTERECTOMY     ADENOIDECTOMY     ANKLE SURGERY Left    BILATERAL CARPAL TUNNEL RELEASE     BREAST BIOPSY Left 2019   benign   ELBOW SURGERY     GASTRIC BYPASS  2014   HAND NEUROPLASTY     radio denervation  05/21/2020   TONSILLECTOMY     Patient Active Problem List   Diagnosis Date Noted   H/O bariatric surgery 06/29/2021   Bilateral tennis elbow 06/15/2021   Pharyngitis 04/30/2021   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   Abscess 03/05/2021   Type 1 diabetes mellitus with diabetic polyneuropathy ([place]) 12/25/2020   Urinary frequency 12/16/2020   Diabetic neuropathy ([place]) 12/09/2020   Myofascial pain 09/12/2020   Wheelchair dependence 09/12/2020   Dyslipidemia 06/19/2020   Spondylosis without myelopathy or radiculopathy, lumbar region 04/27/2020   Sensorineural hearing loss (SNHL) of both ears 08/21/2019   Tinnitus, bilateral 06/27/2019   FH: ovarian cancer in first  degree relative 11/16/2018   Hypercholesteremia 11/15/2018   Gait abnormality 11/07/2018   Pain of left hip joint 05/18/2018   Chronic midline low back pain with bilateral sciatica 05/18/2018   Breast calcifications 12/28/2017   Fibromyalgia 12/06/2017   History of gastric bypass 12/06/2017   Spinal stenosis 12/06/2017   Hypokalemia 12/06/2017   Asthma 12/06/2017   Cerebellar ataxia ([place])    Type I diabetes mellitus with complication, uncontrolled    Serum albumin decreased 11/10/2017   Vitamin B6 deficiency 11/10/2017   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   Hypoglycemia due to type 1 diabetes mellitus ([place]) 06/20/2016   Hyperparathyroidism ([place]) 10/29/2014   Increased PTH level 10/01/2014   Obstructive sleep apnea on CPAP 10/01/2014   Non-organic enuresis 05/28/2014   Urge incontinence of urine 05/28/2014   History of vaginal hysterectomy 05/24/2014   History of hypertension 04/01/2014   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   Pain of lumbar facet joint 04/11/2013   Mild persistent asthma without complication 37/12/6267   Carpal tunnel syndrome 48/54/6270   Lichen planus 35/00/9381   Adjustment disorder with mixed anxiety and depressed mood 05/11/2012   Degeneration of lumbar intervertebral disc 04/26/2012   Pain in joint involving ankle and foot 04/26/2012   Osteoarthritis of hand 05/24/2011   Leukopenia 09/25/2009    ONSET DATE: 07/06/2021 (  MD referral); initial symptoms 2007  REFERRING DIAG: G11.9 (ICD-10-CM) - Cerebellar ataxia ([place]) R26.9 (ICD-10-CM) - Gait abnormality   THERAPY DIAG:  Muscle weakness (generalized)  Unsteadiness on feet  Other abnormalities of gait and mobility  SUBJECTIVE:                                                                                                                                                                                              SUBJECTIVE STATEMENT: Got a ball to do my exercises at home.  Will  be doing pelvic floor exercises with therapy (at [facility]/urologist office)  Pt accompanied by: self  PERTINENT HISTORY: Has Cerebellar atrophy with ataxia and also chronic low back pain s/p radiofrequency ablation 3/22; Has had trigger point injections for L tennis elbow. Has has Myofascial pain and fibromyalgia.  lumbar spondylosis, chronic LBP  PMH:  DM, neuropathy, closed foot fracture fall of 2022, HLD, PE, spinal stenosis, tennis elbow  PAIN:  Are you having pain? Yes: NPRS scale: 5/10 Pain location: L elbow, forearm Pain description: sharp, achy Aggravating factors: opening objects, extending fingers Relieving factors: trigger point injections *Pt had OT eval on 07/29/2021  PRECAUTIONS: Fall  -hx of Type 1 DM, carries glucose tablets with her; follows the "Rule of 15"  PATIENT GOALS Pt's goals for therapy are to increase strength and mobility.         TODAY'S TREATMENT: 08/03/2021 Activity Comments  Seated core activation with alt UE lifts x 10, then alt marching x 10 Cues for slowed pace throughout exercises  Seated core activation with opposite arm/leg lifts x 10 reps   Seated core activation with LAQ x 10, then with opposite arm/leg kick, 2 x 5 reps   W/c push-ups x 10 reps Improved control from previous session     STanding balance: BUE at counter with head turns, head nods 2 x 5 reps Lateral weightshifting x 10 with tactile cues to grade motion Rhythmic stabilization lateral resistance give at hips Alt UE lifts to touch cabinet, 10 reps, then 5 reps Varied UE lifts/reaches with looking at targets x 2 minutes, then 2.5 minutes-cues to look at target then reach for optimal steadiness Cues to reset posture with glut/quad sets            Sit<>Stand at least 10 reps throughout session, w/c to counter, with supervision, UE support.  Berg Score: 19/56 (07/22/2021)   PATIENT EDUCATION: Education details: For standing reaching tasks (like the grocery store)-look at  target, set/activate core and then reach Person educated: Patient Education method: Explanation, Demonstration, Tactile cues, Verbal cues, and Handouts Education comprehension: verbalized  understanding and returned demonstration   OBJECTIVE: measures were taken at time of initial evaluation unless otherwise specified  MMT:    MMT Right 08/03/2021 Left 08/03/2021  Hip flexion 5/5 5/5  Hip extension    Hip abduction 4/5 4/5  Hip adduction 4/5 4/5  Hip internal rotation    Hip external rotation    Knee flexion 5/5 5/5  Knee extension 5/5 5/5  Ankle dorsiflexion 3+/5 3+/5  Ankle plantarflexion    Ankle inversion    Ankle eversion    (Blank rows = not tested)  TRANSFERS: Assistive device utilized: Environmental consultant - 2 wheeled  Sit to stand: SBA Stand to sit: SBA Pt stands at [employer] x 1:26, prior to needing to sit.  GAIT: Gait pattern: step to pattern, step through pattern, ataxic, and wide BOS Distance walked: 20 ft Assistive device utilized: Walker - 2 wheeled Level of assistance: CGA and Min A Comments: With TUG test.  Pt reports difficulty with ramp/incline negotiation.  FUNCTIONAL TESTs:  Timed up and go (TUG): 50.34 sec   HOME EXERCISE PROGRAM: MedBridge Access Code: [email or web address] URL: [email or web address] Date: 07/20/2021 Prepared by: [facility]      GOALS: Goals reviewed with patient? Yes  SHORT TERM GOALS: Target date: 08/17/2021  Pt will be independent with HEP for improved strength, standing, transfers, balance. Baseline: Goal status: IN PROGRESS  2.  Pt perform standing at counter x 3 minutes with UE support for improved standing tolerance. Baseline: 1 min, 26 sec at eval Goal status: IN PROGRESS    LONG TERM GOALS: Target date: 09/14/2021   Pt will be independent with HEP for improved strength, balance, transfers, and gait. Baseline: initiated Goal status: IN PROGRESS  2.  Pt will perform at least 5 minutes  of standing at counter/RW for improved standing balance and performance with ADLs. Baseline: unable Goal status: IN PROGRESS  3.  Pt will improve TUG score to less than or equal to 35 sec for decreased fall risk. Baseline: 50.34 sec Goal status: IN PROGRESS  4.  Berg Balance test to be assessed, with score to improve by at least 5 points for decreased fall risk. Baseline: 19/56 07/1021 Goal status: IN PROGRESS  5.  Pt will verbalize plans for ongoing community fitness, including aquatic fitness options, upon d/c from PT. Baseline:  Goal status: IN PROGRESS   ASSESSMENT:  CLINICAL IMPRESSION: Focus of today's skilled PT session is core stability in sitting and standing tasks.  She is able to perform standing tasks for at least 2 minutes, multiple times in therapy session. She appears to respond well to cues for improved control through hips and trunk with standing and with reaching tasks.  She will continue to benefit from skilled PT towards goals for imrpoved overall functional mobility.   OBJECTIVE IMPAIRMENTS Abnormal gait, decreased activity tolerance, decreased balance, decreased endurance, decreased mobility, difficulty walking, decreased strength, and postural dysfunction.   ACTIVITY LIMITATIONS community activity and community fitness .   PERSONAL FACTORS  PMH:  Cerebellar atrophy with ataxia, chronic low back pain s/p radiofrequency ablation 3/22; trigger point injections for L tennis elbow; myofascial pain and fibromyalgia.  lumbar spondylosis.  DM, neuropathy, closed foot fracture fall of 2022, HLD, PE, spinal stenosis, are also affecting patient's functional outcome.    REHAB POTENTIAL: Good  CLINICAL DECISION MAKING: Evolving/moderate complexity  EVALUATION COMPLEXITY: Moderate  PLAN: PT FREQUENCY: 2x/week  PT DURATION: 8 weeks  including eval week  PLANNED INTERVENTIONS: Therapeutic  exercises, Therapeutic activity, Neuromuscular re-education, Balance training,  Gait training, Patient/Family education, Joint mobilization, DME instructions, Aquatic Therapy, and Manual therapy  PLAN FOR NEXT SESSION: Continue to progress lower extremity strength and standing balance. Core stability, posture exercises. Standing exercises at counter-try head turns/nods, lateral or A/P weightshifting, reaching tasks.  At some point in POC, plan for transition, maybe 1x/wk to aquatic therapy.   Mady Haagensen, PT 08/03/21 9:31 AM Phone: [phone] Fax: [phone]

## 2021-08-04 NOTE — Therapy (Signed)
OUTPATIENT PHYSICAL THERAPY NEURO TREATMENT   Patient Name: Joyce Brown MRN: [id number] DOB:March 22, 1950, 71 y.o., female Today's Date: 08/05/2021  PCP: Talbert Cage, MD REFERRING PROVIDER: Marcial Pacas, MD  END OF SESSION:   PT End of Session - 08/05/21 1024     Visit Number 6    Number of Visits 18    Date for PT Re-Evaluation 09/14/21    Authorization Type Medicare/[employer]    PT Start Time 0931    PT Stop Time 1014    PT Time Calculation (min) 43 min    Equipment Utilized During Treatment Gait belt    Activity Tolerance Patient tolerated treatment well    Behavior During Therapy WFL for tasks assessed/performed;Impulsive               Past Medical History:  Diagnosis Date   Cerebellar ataxia ([place])    Diabetes mellitus without complication ([place])    Diabetic neuropathy ([place]) 12/09/2020   Foot fracture 2022   Gait abnormality 11/07/2018   Hyperlipidemia    Pulmonary embolism ([place])    Sleep apnea    Spinal stenosis    Past Surgical History:  Procedure Laterality Date   ABDOMINAL HYSTERECTOMY     ADENOIDECTOMY     ANKLE SURGERY Left    BILATERAL CARPAL TUNNEL RELEASE     BREAST BIOPSY Left 2019   benign   ELBOW SURGERY     GASTRIC BYPASS  2014   HAND NEUROPLASTY     radio denervation  05/21/2020   TONSILLECTOMY     Patient Active Problem List   Diagnosis Date Noted   H/O bariatric surgery 06/29/2021   Bilateral tennis elbow 06/15/2021   Pharyngitis 04/30/2021   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   Abscess 03/05/2021   Type 1 diabetes mellitus with diabetic polyneuropathy ([place]) 12/25/2020   Urinary frequency 12/16/2020   Diabetic neuropathy ([place]) 12/09/2020   Myofascial pain 09/12/2020   Wheelchair dependence 09/12/2020   Dyslipidemia 06/19/2020   Spondylosis without myelopathy or radiculopathy, lumbar region 04/27/2020   Sensorineural hearing loss (SNHL) of both ears 08/21/2019   Tinnitus, bilateral 06/27/2019   FH: ovarian cancer  in first degree relative 11/16/2018   Hypercholesteremia 11/15/2018   Gait abnormality 11/07/2018   Pain of left hip joint 05/18/2018   Chronic midline low back pain with bilateral sciatica 05/18/2018   Breast calcifications 12/28/2017   Fibromyalgia 12/06/2017   History of gastric bypass 12/06/2017   Spinal stenosis 12/06/2017   Hypokalemia 12/06/2017   Asthma 12/06/2017   Cerebellar ataxia ([place])    Type I diabetes mellitus with complication, uncontrolled    Serum albumin decreased 11/10/2017   Vitamin B6 deficiency 11/10/2017   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   Hypoglycemia due to type 1 diabetes mellitus ([place]) 06/20/2016   Hyperparathyroidism ([place]) 10/29/2014   Increased PTH level 10/01/2014   Obstructive sleep apnea on CPAP 10/01/2014   Non-organic enuresis 05/28/2014   Urge incontinence of urine 05/28/2014   History of vaginal hysterectomy 05/24/2014   History of hypertension 04/01/2014   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   Pain of lumbar facet joint 04/11/2013   Mild persistent asthma without complication 90/24/0973   Carpal tunnel syndrome 53/29/9242   Lichen planus 68/34/1962   Adjustment disorder with mixed anxiety and depressed mood 05/11/2012   Degeneration of lumbar intervertebral disc 04/26/2012   Pain in joint involving ankle and foot 04/26/2012   Osteoarthritis of hand 05/24/2011   Leukopenia 09/25/2009    ONSET DATE:  07/06/2021 (MD referral); initial symptoms 2007  REFERRING DIAG: G11.9 (ICD-10-CM) - Cerebellar ataxia ([place]) R26.9 (ICD-10-CM) - Gait abnormality   THERAPY DIAG:  Muscle weakness (generalized)  Unsteadiness on feet  Other abnormalities of gait and mobility  SUBJECTIVE:                                                                                                                                                                                              SUBJECTIVE STATEMENT: Continues to practice her HEP. Denies  new issues.   Pt accompanied by: self  PERTINENT HISTORY: Has Cerebellar atrophy with ataxia and also chronic low back pain s/p radiofrequency ablation 3/22; Has had trigger point injections for L tennis elbow. Has has Myofascial pain and fibromyalgia.  lumbar spondylosis, chronic LBP  PMH:  DM, neuropathy, closed foot fracture fall of 2022, HLD, PE, spinal stenosis, tennis elbow  PAIN:  Are you having pain? Yes: NPRS scale: 5/10 Pain location: L dorsal forearm Pain description: sharp, achy Aggravating factors: opening objects, extending fingers Relieving factors: trigger point injections   PRECAUTIONS: Fall  -hx of Type 1 DM, carries glucose tablets with her; follows the "Rule of 15"  PATIENT GOALS Pt's goals for therapy are to increase strength and mobility.        TODAY'S TREATMENT: 08/05/21 Activity Comments  Squat pivot transfer w/c>mat, stand pivot transfer mat>w/c  Supervision, min A d/t instability   sitting ab set with pball in lap 10x5" Cues to avoid valsalva; good sitting posture   sitting alt UE/LE lift 2x10 Cues to light chest upright and raise L arm higher  paloff press with red TB 10x each side Cues to depress shoulders and encourage anterior pelvic tilt  mini squat in II bars 10x Cues to shift hips back when sitting into squat and to push hips forward upon standing up  lateral and A/P weightshifting while weaning UE support to 1 hand CGA and cueing to lift chest, push hips forward, encourage further L wt shift  Romberg 2x30" with 1 UE support on II bar Greater L sided sway and CGA for safety   STS using II bars 5x Cueing for transfer set up and avoiding pushing LEs back into w/c; cues to slow down     TREATMENT: 08/03/2021 Activity Comments  Seated core activation with alt UE lifts x 10, then alt marching x 10 Cues for slowed pace throughout exercises  Seated core activation with opposite arm/leg lifts x 10 reps   Seated core activation with LAQ x 10, then  with opposite arm/leg kick, 2 x 5 reps   W/c push-ups x  10 reps Improved control from previous session     STanding balance: BUE at counter with head turns, head nods 2 x 5 reps Lateral weightshifting x 10 with tactile cues to grade motion Rhythmic stabilization lateral resistance give at hips Alt UE lifts to touch cabinet, 10 reps, then 5 reps Varied UE lifts/reaches with looking at targets x 2 minutes, then 2.5 minutes-cues to look at target then reach for optimal steadiness Cues to reset posture with glut/quad sets            Sit<>Stand at least 10 reps throughout session, w/c to counter, with supervision, UE support.  Berg Score: 19/56 (07/22/2021)   PATIENT EDUCATION: Education details: For standing reaching tasks (like the grocery store)-look at target, set/activate core and then reach Person educated: Patient Education method: Explanation, Demonstration, Tactile cues, Verbal cues, and Handouts Education comprehension: verbalized understanding and returned demonstration   OBJECTIVE: measures were taken at time of initial evaluation unless otherwise specified  MMT:    MMT Right 08/05/2021 Left 08/05/2021  Hip flexion 5/5 5/5  Hip extension    Hip abduction 4/5 4/5  Hip adduction 4/5 4/5  Hip internal rotation    Hip external rotation    Knee flexion 5/5 5/5  Knee extension 5/5 5/5  Ankle dorsiflexion 3+/5 3+/5  Ankle plantarflexion    Ankle inversion    Ankle eversion    (Blank rows = not tested)  TRANSFERS: Assistive device utilized: Environmental consultant - 2 wheeled  Sit to stand: SBA Stand to sit: SBA Pt stands at [employer] x 1:26, prior to needing to sit.  GAIT: Gait pattern: step to pattern, step through pattern, ataxic, and wide BOS Distance walked: 20 ft Assistive device utilized: Walker - 2 wheeled Level of assistance: CGA and Min A Comments: With TUG test.  Pt reports difficulty with ramp/incline negotiation.  FUNCTIONAL TESTs:  Timed up and go (TUG): 50.34  sec   HOME EXERCISE PROGRAM: MedBridge Access Code: [email or web address] URL: [email or web address] Date: 07/20/2021 Prepared by: [facility]      GOALS: Goals reviewed with patient? Yes  SHORT TERM GOALS: Target date: 08/17/2021  Pt will be independent with HEP for improved strength, standing, transfers, balance. Baseline: Goal status: IN PROGRESS  2.  Pt perform standing at counter x 3 minutes with UE support for improved standing tolerance. Baseline: 1 min, 26 sec at eval Goal status: IN PROGRESS    LONG TERM GOALS: Target date: 09/14/2021   Pt will be independent with HEP for improved strength, balance, transfers, and gait. Baseline: initiated Goal status: IN PROGRESS  2.  Pt will perform at least 5 minutes of standing at counter/RW for improved standing balance and performance with ADLs. Baseline: unable Goal status: IN PROGRESS  3.  Pt will improve TUG score to less than or equal to 35 sec for decreased fall risk. Baseline: 50.34 sec Goal status: IN PROGRESS  4.  Berg Balance test to be assessed, with score to improve by at least 5 points for decreased fall risk. Baseline: 19/56 07/1021 Goal status: IN PROGRESS  5.  Pt will verbalize plans for ongoing community fitness, including aquatic fitness options, upon d/c from PT. Baseline:  Goal status: IN PROGRESS   ASSESSMENT:  CLINICAL IMPRESSION: Patient arrived to session without complaints. Worked on sitting balance and core strengthening today. Patient demonstrated slight difficulty with activities requiring coordination. Limb ataxia evident with sitting paloff press activity and patient required correction of form intermittently.  Patient performed standing balance exercises in parallel bars with verbal and manual cueing to address posture. Also worked on STS transfers with focus on reducing pace and taking care for proper set up. Intermittent sitting rest breaks required  d/t fatigue. Patient tolerated session well and without complaints upon leaving.    OBJECTIVE IMPAIRMENTS Abnormal gait, decreased activity tolerance, decreased balance, decreased endurance, decreased mobility, difficulty walking, decreased strength, and postural dysfunction.   ACTIVITY LIMITATIONS community activity and community fitness .   PERSONAL FACTORS  PMH:  Cerebellar atrophy with ataxia, chronic low back pain s/p radiofrequency ablation 3/22; trigger point injections for L tennis elbow; myofascial pain and fibromyalgia.  lumbar spondylosis.  DM, neuropathy, closed foot fracture fall of 2022, HLD, PE, spinal stenosis, are also affecting patient's functional outcome.    REHAB POTENTIAL: Good  CLINICAL DECISION MAKING: Evolving/moderate complexity  EVALUATION COMPLEXITY: Moderate  PLAN: PT FREQUENCY: 2x/week  PT DURATION: 8 weeks  including eval week  PLANNED INTERVENTIONS: Therapeutic exercises, Therapeutic activity, Neuromuscular re-education, Balance training, Gait training, Patient/Family education, Joint mobilization, DME instructions, Aquatic Therapy, and Manual therapy  PLAN FOR NEXT SESSION: Continue to progress lower extremity strength and standing balance. Core stability, posture exercises. Standing exercises at counter-try head turns/nods, lateral or A/P weightshifting, reaching tasks.  At some point in POC, plan for transition, maybe 1x/wk to aquatic therapy.   Janene Harvey, PT, DPT 08/05/21 10:25 AM

## 2021-08-05 ENCOUNTER — Ambulatory Visit: Payer: Medicare Other | Admitting: Physical Therapy

## 2021-08-05 ENCOUNTER — Encounter: Payer: Self-pay | Admitting: Occupational Therapy

## 2021-08-05 ENCOUNTER — Encounter: Payer: Self-pay | Admitting: Physical Therapy

## 2021-08-05 ENCOUNTER — Encounter: Payer: Self-pay | Admitting: Internal Medicine

## 2021-08-05 ENCOUNTER — Ambulatory Visit: Payer: Medicare Other | Admitting: Occupational Therapy

## 2021-08-05 DIAGNOSIS — R2681 Unsteadiness on feet: Secondary | ICD-10-CM | POA: Diagnosis not present

## 2021-08-05 DIAGNOSIS — R278 Other lack of coordination: Secondary | ICD-10-CM | POA: Diagnosis not present

## 2021-08-05 DIAGNOSIS — R2689 Other abnormalities of gait and mobility: Secondary | ICD-10-CM

## 2021-08-05 DIAGNOSIS — R29818 Other symptoms and signs involving the nervous system: Secondary | ICD-10-CM | POA: Diagnosis not present

## 2021-08-05 DIAGNOSIS — R270 Ataxia, unspecified: Secondary | ICD-10-CM

## 2021-08-05 DIAGNOSIS — R41840 Attention and concentration deficit: Secondary | ICD-10-CM

## 2021-08-05 DIAGNOSIS — M7711 Lateral epicondylitis, right elbow: Secondary | ICD-10-CM

## 2021-08-05 DIAGNOSIS — M6281 Muscle weakness (generalized): Secondary | ICD-10-CM | POA: Diagnosis not present

## 2021-08-05 DIAGNOSIS — M7712 Lateral epicondylitis, left elbow: Secondary | ICD-10-CM

## 2021-08-05 NOTE — Patient Instructions (Signed)
Soft tissue massage for 5 minutes about 2x/day Clockwise and counterclockwise along the length of your forearm  Warm heating pad 2x/day for 10-15 minutes  Avoid activities and movements that aggravate the pain, particularly when grasping or lifting

## 2021-08-05 NOTE — Therapy (Unsigned)
OUTPATIENT OCCUPATIONAL THERAPY TREATMENT NOTE  Patient Name: Joyce Brown MRN: [id number] DOB:Aug 25, 1950, 71 y.o., female Today's Date: 08/05/2021  PCP: Talbert Cage, MD REFERRING PROVIDER: Marcial Pacas, MD   OT End of Session - 08/05/21 0844     Visit Number 2    Number of Visits 17    Date for OT Re-Evaluation 10/07/21    Authorization Type Medicare    Progress Note Due on Visit 10    OT Start Time 0840    OT Stop Time 0925    OT Time Calculation (min) 45 min    Behavior During Therapy [facility] for tasks assessed/performed            Past Medical History:  Diagnosis Date   Cerebellar ataxia ([place])    Diabetes mellitus without complication ([place])    Diabetic neuropathy ([place]) 12/09/2020   Foot fracture 2022   Gait abnormality 11/07/2018   Hyperlipidemia    Pulmonary embolism ([place])    Sleep apnea    Spinal stenosis    Past Surgical History:  Procedure Laterality Date   ABDOMINAL HYSTERECTOMY     ADENOIDECTOMY     ANKLE SURGERY Left    BILATERAL CARPAL TUNNEL RELEASE     BREAST BIOPSY Left 2019   benign   ELBOW SURGERY     GASTRIC BYPASS  2014   HAND NEUROPLASTY     radio denervation  05/21/2020   TONSILLECTOMY     Patient Active Problem List   Diagnosis Date Noted   H/O bariatric surgery 06/29/2021   Bilateral tennis elbow 06/15/2021   Pharyngitis 04/30/2021   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   Abscess 03/05/2021   Type 1 diabetes mellitus with diabetic polyneuropathy ([place]) 12/25/2020   Urinary frequency 12/16/2020   Diabetic neuropathy ([place]) 12/09/2020   Myofascial pain 09/12/2020   Wheelchair dependence 09/12/2020   Dyslipidemia 06/19/2020   Spondylosis without myelopathy or radiculopathy, lumbar region 04/27/2020   Sensorineural hearing loss (SNHL) of both ears 08/21/2019   Tinnitus, bilateral 06/27/2019   FH: ovarian cancer in first degree relative 11/16/2018   Hypercholesteremia 11/15/2018   Gait abnormality 11/07/2018   Pain  of left hip joint 05/18/2018   Chronic midline low back pain with bilateral sciatica 05/18/2018   Breast calcifications 12/28/2017   Fibromyalgia 12/06/2017   History of gastric bypass 12/06/2017   Spinal stenosis 12/06/2017   Hypokalemia 12/06/2017   Asthma 12/06/2017   Cerebellar ataxia ([place])    Type I diabetes mellitus with complication, uncontrolled    Serum albumin decreased 11/10/2017   Vitamin B6 deficiency 11/10/2017   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   Hypoglycemia due to type 1 diabetes mellitus ([place]) 06/20/2016   Hyperparathyroidism ([place]) 10/29/2014   Increased PTH level 10/01/2014   Obstructive sleep apnea on CPAP 10/01/2014   Non-organic enuresis 05/28/2014   Urge incontinence of urine 05/28/2014   History of vaginal hysterectomy 05/24/2014   History of hypertension 04/01/2014   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   Pain of lumbar facet joint 04/11/2013   Mild persistent asthma without complication 85/04/7739   Carpal tunnel syndrome 28/78/6767   Lichen planus 20/94/7096   Adjustment disorder with mixed anxiety and depressed mood 05/11/2012   Degeneration of lumbar intervertebral disc 04/26/2012   Pain in joint involving ankle and foot 04/26/2012   Osteoarthritis of hand 05/24/2011   Leukopenia 09/25/2009    ONSET DATE: 07/22/21 (referral date)  REFERRING DIAG:  R26.89 (ICD-10-CM) - Other abnormalities of gait and mobility R29.818 (  ICD-10-CM) - Other symptoms and signs involving the nervous system   THERAPY DIAG:  Ataxia  Lateral epicondylitis, left elbow  Lateral epicondylitis of right elbow  Attention and concentration deficit  Other lack of coordination  Other abnormalities of gait and mobility  SUBJECTIVE:   SUBJECTIVE STATEMENT: Pt reports she found an online ADL questionnaire and created a handout for both PT and OT Pt accompanied by: self  PERTINENT HISTORY: Cerebellar atrophy w/ ataxia and chronic low back pain s/p  radiofrequency ablation 3/22; myofascial pain and fibromyalgia; bilateral epicondylitis elbow (symptoms started in April 2023)  PMH includes T1DM w/ diabetic neuropathy, asthma, OSA on CPAP, and hx of HTN, PE, foot fracture in 2022, medial epicondylectomy (approx '98), bilateral carpal tunnel release  PRECAUTIONS: Fall; carries glucose tablets and follows the "Rule of 15" for T1DM, 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 Yes: NPRS scale: 5/10 Pain location: slightly distal to L elbow Pain description: neuropathic pain Aggravating factors: movement, specific positions (supination, reaching to the left) Relieving factors: pain patches, ice pack  PATIENT GOALS Stretches for tennis elbow; increase dexterity   OBJECTIVE:   ADLs: Overall ADLs: Reports Mod I w/ all BADLs Transfers/ambulation related to ADLs: w/c for in-home mobility; RW for transfers in/out of bathroom Equipment: Transfer tub bench, Grab bars, Reacher, and elevated toilet seat  IADLs: Shopping: w/c mobility to and from grocery; lives very close to a [employer] housekeeping: Able to complete light tasks; assist for heavier housekeeping Meal Prep: Microwave meal prep, simple snack prep Community mobility: Uses [employer] [place] paratransit services; has not driven in > 10 years Medication management: Independent Financial management: Independent Handwriting:  Unable to be assessed at evaluation; to be further assessed prn   TODAY'S TREATMENT - 08/05/21: Coordination activities w/ RUE, including: rotating small ball w/ fingertips, tossing/catching ball w/ ipsilateral UE, and rolling ball back and forth in-hand ; unable to tolerate completing activities w/ LUE due to pain. OT provided demonstrate and verbal cues during activities prn. Copying pattern onto pegboard w/ mini pegs to facilitate NMR of R hand coordination and dexterity, visual perception, and GMC w/ good body mechanics. OT quickly graded  activity up to have pt pick up 3 pegs one at a time and translate from palm-to-fingertips to place in pegboard. Pt able to complete activity w/ 1 correction for spatial orientation and demonstrated mod drops.  PATIENT EDUCATION: Condition-specific education related to tennis elbow symptoms, including soft tissue massage (demonstration provided), moist heat, and specific precautions and activities and movement patterns to avoid for rest and prevention of aggravation of symptoms (reviewed pt-specific examples) Reviewed use of elastic shoelaces and discussed potential options for self-purchase Person educated: Patient Education method: Explanation Education comprehension: verbalized understanding   HOME EXERCISE PROGRAM: TBD   GOALS: Goals reviewed with patient? Yes  SHORT TERM GOALS: Target date: 08/28/2021  STG  Status:  1 Pt will verbalize understanding of at least 2 precautions and positional considerations related to worsening of lateral epicondylitis symptoms Baseline: Decreased knowledge of precautions Achieved - 08/05/21  2 Pt will demonstrate understanding of initial HEP designed for increased BUE GMC, ROM, and stretching Baseline: No HEP at this time In progress  3 Pt will demonstrate understanding of at least 1 visual compensatory strategy to reduce functional impact of visual perceptual limitations Baseline: Reports diplopia; no currently compensatory strategies In progress    LONG TERM GOALS: Target date: 09/25/2021  LTG  Status:  1 Pt will demonstrate understanding  of initial HEP designed for increased BUE GMC, ROM, and stretching Baseline: No HEP at this time In progress  2 Pt will improve L hand grip strength by at least 9 lbs w/ pain less than 2/10 by discharge Baseline: 35 lbs (RUE 58 lbs) In progress   3 Pt will increase Box and Blocks score bilaterally by at least 3 blocks to demonstrate improved GMC Baseline: 34 w/ RUE; 26 w/ LUE In progress  4 Pt will be able to  grip and lift a lightweight object (e.g., coffee mug) w/out pain in L elbow Baseline: Pain w/ grasp and lifting In progress  5 Pt will decrease time to complete 9-HPT by at least 4 seconds to indicate improved control and coordination for functional bilateral FM tasks Baseline: 54.9 sec (RUE 38.5 sec) In progress     ASSESSMENT:  CLINICAL IMPRESSION: Pt arrives for first treatment session after initial evaluation on 07/29/21. OT started session discussing conservative treatment for lateral epicondylitis (see 'Education' above) and reviewed potential benefit of custom-fit wrist immobilization orthosis vs long-arm orthosis, which would be too restrictive for pt's needs. Pt is agreeable to try this next session. OT then introduced coordination activities pt is able to work on at home, providing pertinent education w/ pt returning demonstration of exercises w/out difficulty. Gross FM skills appear to be less limited than higher level FM skills (translation, shift, etc.) as evidenced by increased difficulty w/ palm-to-finger translation compared to gross pinch and release.  PERFORMANCE DEFICITS in functional skills including ADLs, IADLs, coordination, dexterity, sensation, tone, ROM, strength, pain, FMC, GMC, mobility, balance, body mechanics, decreased knowledge of precautions, decreased knowledge of use of DME, vision, and UE functional use.   IMPAIRMENTS are limiting patient from ADLs, IADLs, rest and sleep, leisure, and social participation.   COMORBIDITIES has co-morbidities such as DM w/ neuropathy and retinopathy, chronic low back pain, asthma, and myofascial pain  that affects occupational performance. Patient will benefit from skilled OT to address above impairments and improve overall function.   PLAN: OT FREQUENCY: 2x/week  OT DURATION: 8 weeks  PLANNED INTERVENTIONS: self care/ADL training, therapeutic exercise, therapeutic activity, neuromuscular re-education, manual therapy, passive  range of motion, functional mobility training, aquatic therapy, splinting, ultrasound, iontophoresis, compression bandaging, moist heat, cryotherapy, patient/family education, visual/perceptual remediation/compensation, and DME and/or AE instructions  RECOMMENDED OTHER SERVICES: Currently receiving PT services at this location  CONSULTED AND AGREED WITH PLAN OF CARE: Patient  PLAN FOR NEXT SESSION: Fabrication of custom wrist immobilization orthosis for LUE   Kathrine Cords, MSOT, OTR/L  08/05/2021, 9:30 AM

## 2021-08-12 ENCOUNTER — Ambulatory Visit: Payer: Medicare Other | Admitting: Physical Therapy

## 2021-08-12 ENCOUNTER — Encounter: Payer: Self-pay | Admitting: Physical Therapy

## 2021-08-12 ENCOUNTER — Encounter: Payer: Self-pay | Admitting: Occupational Therapy

## 2021-08-12 ENCOUNTER — Ambulatory Visit: Payer: Medicare Other | Admitting: Occupational Therapy

## 2021-08-12 DIAGNOSIS — M6281 Muscle weakness (generalized): Secondary | ICD-10-CM | POA: Diagnosis not present

## 2021-08-12 DIAGNOSIS — M7712 Lateral epicondylitis, left elbow: Secondary | ICD-10-CM

## 2021-08-12 DIAGNOSIS — R2689 Other abnormalities of gait and mobility: Secondary | ICD-10-CM

## 2021-08-12 DIAGNOSIS — R29818 Other symptoms and signs involving the nervous system: Secondary | ICD-10-CM | POA: Diagnosis not present

## 2021-08-12 DIAGNOSIS — R41840 Attention and concentration deficit: Secondary | ICD-10-CM

## 2021-08-12 DIAGNOSIS — R278 Other lack of coordination: Secondary | ICD-10-CM | POA: Diagnosis not present

## 2021-08-12 DIAGNOSIS — M7711 Lateral epicondylitis, right elbow: Secondary | ICD-10-CM

## 2021-08-12 DIAGNOSIS — R2681 Unsteadiness on feet: Secondary | ICD-10-CM

## 2021-08-12 DIAGNOSIS — R270 Ataxia, unspecified: Secondary | ICD-10-CM

## 2021-08-12 NOTE — Therapy (Signed)
OUTPATIENT OCCUPATIONAL THERAPY TREATMENT NOTE  Patient Name: Joyce Brown MRN: [id number] DOB:[birth year], 71 y.o., female Today's Date: 08/12/2021  PCP: Talbert Cage, MD REFERRING PROVIDER: Marcial Pacas, MD   OT End of Session - 08/12/21 [phone]     Visit Number 3    Number of Visits 17    Date for OT Re-Evaluation 10/07/21    Authorization Type Medicare    Progress Note Due on Visit 10    OT Start Time 0805    OT Stop Time 0845    OT Time Calculation (min) 40 min    Activity Tolerance Patient tolerated treatment well    Behavior During Therapy [facility] for tasks assessed/performed            Past Medical History:  Diagnosis Date   Cerebellar ataxia ([place])    Diabetes mellitus without complication ([place])    Diabetic neuropathy ([place]) 12/09/2020   Foot fracture 2022   Gait abnormality 11/07/2018   Hyperlipidemia    Pulmonary embolism ([place])    Sleep apnea    Spinal stenosis    Past Surgical History:  Procedure Laterality Date   ABDOMINAL HYSTERECTOMY     ADENOIDECTOMY     ANKLE SURGERY Left    BILATERAL CARPAL TUNNEL RELEASE     BREAST BIOPSY Left 2019   benign   ELBOW SURGERY     GASTRIC BYPASS  2014   HAND NEUROPLASTY     radio denervation  05/21/2020   TONSILLECTOMY     Patient Active Problem List   Diagnosis Date Noted   H/O bariatric surgery 06/29/2021   Bilateral tennis elbow 06/15/2021   Pharyngitis 04/30/2021   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   Abscess 03/05/2021   Type 1 diabetes mellitus with diabetic polyneuropathy ([place]) 12/25/2020   Urinary frequency 12/16/2020   Diabetic neuropathy ([place]) 12/09/2020   Myofascial pain 09/12/2020   Wheelchair dependence 09/12/2020   Dyslipidemia 06/19/2020   Spondylosis without myelopathy or radiculopathy, lumbar region 04/27/2020   Sensorineural hearing loss (SNHL) of both ears 08/21/2019   Tinnitus, bilateral 06/27/2019   FH: ovarian cancer in first degree relative 11/16/2018    Hypercholesteremia 11/15/2018   Gait abnormality 11/07/2018   Pain of left hip joint 05/18/2018   Chronic midline low back pain with bilateral sciatica 05/18/2018   Breast calcifications 12/28/2017   Fibromyalgia 12/06/2017   History of gastric bypass 12/06/2017   Spinal stenosis 12/06/2017   Hypokalemia 12/06/2017   Asthma 12/06/2017   Cerebellar ataxia ([place])    Type I diabetes mellitus with complication, uncontrolled    Serum albumin decreased 11/10/2017   Vitamin B6 deficiency 11/10/2017   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   Hypoglycemia due to type 1 diabetes mellitus ([name]) 06/20/2016   Hyperparathyroidism ([place]) 10/29/2014   Increased PTH level 10/01/2014   Obstructive sleep apnea on CPAP 10/01/2014   Non-organic enuresis 05/28/2014   Urge incontinence of urine 05/28/2014   History of vaginal hysterectomy 05/24/2014   History of hypertension 04/01/2014   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   Pain of lumbar facet joint 04/11/2013   Mild persistent asthma without complication 48/03/6551   Carpal tunnel syndrome 74/82/7078   Lichen planus 67/54/4920   Adjustment disorder with mixed anxiety and depressed mood 05/11/2012   Degeneration of lumbar intervertebral disc 04/26/2012   Pain in joint involving ankle and foot 04/26/2012   Osteoarthritis of hand 05/24/2011   Leukopenia 09/25/2009    ONSET DATE: 07/22/21 (referral date)  REFERRING DIAG:  R26.89 (  ICD-10-CM) - Other abnormalities of gait and mobility R29.818 (ICD-10-CM) - Other symptoms and signs involving the nervous system   THERAPY DIAG:  Ataxia  Other abnormalities of gait and mobility  Other lack of coordination  Lateral epicondylitis, left elbow  Lateral epicondylitis of right elbow  Attention and concentration deficit  SUBJECTIVE:   SUBJECTIVE STATEMENT: Pt reports she found some elastic shoelaces online and they should be arriving soon! 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 Yes: NPRS scale: 5/10 Pain location: slightly distal to L elbow Pain description: neuropathic pain Aggravating factors: movement, specific positions (supination, reaching to the left) Relieving factors: pain patches, ice pack  PATIENT GOALS Stretches for tennis elbow; increase dexterity   OBJECTIVE:   ADLs: Overall ADLs: Reports Mod I w/ all BADLs Transfers/ambulation related to ADLs: w/c for in-home mobility; RW for transfers in/out of bathroom Equipment: Transfer tub bench, Grab bars, Reacher, and elevated toilet seat  IADLs: Shopping: w/c mobility to and from grocery; lives very close to a [employer] housekeeping: Able to complete light tasks; assist for heavier housekeeping Meal Prep: Microwave meal prep, simple snack prep Community mobility: Uses [employer] [place] paratransit services; has not driven in > 10 years Medication management: Independent Financial management: Independent Handwriting:  Unable to be assessed at evaluation; to be further assessed prn   TODAY'S TREATMENT - 08/12/21: Modified sit-ups completed in w/c w/ red theraband attached to w/c handles and pt holding theraband while flexing trunk forward; completed 2 sets of 10 reps w/ OT problem-solving options for carryover of exercise to home Lateral lean, weight shifting to alternating sides, while sitting EOM; ipsilateral UE used for support prn w/ OT emphasizing focus on core engagement to return to  midline; completed 2 sets of 10 reps w/out difficulty Stand-pivot transfer at [facility] w/ verbal cues for increased control of eccentric contraction w/ transition from standing to seated position during both attempts; transferred to R side each time Picking up and rotating dice in-hand to place a specified number face-up on tabletop using fingertips only; completed 1 set of 10 dice w/ each hand and min drop w/ R hand vs no drops w/ L hand though pt reported increased difficulty w/ L hand vs. R hand Coordination activities w/ each UE or BUEs as appropriate, including: flipping cards one at a time off a deck, rotating cards in-hand/turning cards end-over end, pushing cards off deck held in-hand using thumb only, picking up various small objects and placing them into a container, picking up various small objects/coins and trying to see how many pt is able to hold in-hand without drops, and picking up a small object and translating palm-to-fingertips; increased ability to tolerance when completing activities w/ L hand this session   PATIENT EDUCATION: Education provided on potential benefit of built-up handles during various tasks, specific discussed knitting Person educated: Patient Education method: Customer service manager Education comprehension: verbalized understanding   HOME EXERCISE PROGRAM: To be administered   GOALS: Goals reviewed with patient? Yes  SHORT TERM GOALS: Target date: 08/28/2021  STG  Status:  1 Pt will verbalize understanding of at least 2 precautions and positional considerations related to worsening of lateral epicondylitis symptoms Baseline: Decreased knowledge of precautions Achieved - 08/05/21  2 Pt will demonstrate understanding of initial HEP designed for increased BUE GMC, ROM, and stretching Baseline: No HEP at this time In progress  3 Pt will demonstrate understanding of at least 1 visual compensatory strategy to reduce functional impact of visual perceptual  limitations Baseline: Reports diplopia; no currently compensatory strategies In progress    LONG TERM GOALS: Target date: 09/25/2021  LTG  Status:  1 Pt will demonstrate understanding of initial HEP designed for increased BUE GMC, ROM, and stretching Baseline: No HEP at this time In progress  2 Pt will improve L hand grip strength by at least 9 lbs w/ pain less than 2/10 by discharge Baseline: 35 lbs (RUE 58 lbs) In progress   3 Pt will increase Box and Blocks score bilaterally by at least 3 blocks to demonstrate improved GMC Baseline: 34 w/ RUE; 26 w/ LUE In progress  4 Pt will be able to grip and lift a lightweight object (e.g., coffee mug) w/out pain in L elbow Baseline: Pain w/ grasp and lifting In progress  5 Pt will decrease time to complete 9-HPT by at least 4 seconds to indicate improved control and coordination for functional bilateral FM tasks Baseline: 54.9 sec (RUE 38.5 sec) In progress     ASSESSMENT:  CLINICAL IMPRESSION: Due to clinic scheduling conflict, OT was unable to fabricate custom-fit wrist immobilization orthosis this session; to be addressed next session. OT then introduced core exercises this session to improve participation and safety w/ functional transitions and transfers considering pt's report of limitations w/ trunk stability. OT also continued to focus on [facility], reviewing and administering full coordination handout this session, emphasizing incorporation of higher level FM skills (e.g., translation, shift, etc.). Pt continues to demonstrate good understanding of learned strategies and exercises, and is motivated to improve.  PERFORMANCE DEFICITS in functional skills including ADLs, IADLs, coordination, dexterity, sensation, tone, ROM, strength, pain, FMC, GMC, mobility, balance, body mechanics, decreased knowledge of precautions, decreased knowledge of use of DME, vision, and UE functional use.   IMPAIRMENTS are limiting patient from ADLs, IADLs, rest and sleep,  leisure, and social participation.   COMORBIDITIES has co-morbidities such as DM w/ neuropathy and retinopathy, chronic low back pain, asthma, and myofascial pain  that affects occupational performance. Patient will benefit from skilled OT to address above impairments and improve overall function.   PLAN: OT FREQUENCY: 2x/week  OT DURATION: 8 weeks  PLANNED INTERVENTIONS: self care/ADL training, therapeutic exercise, therapeutic activity, neuromuscular re-education, manual therapy, passive range of motion, functional mobility training, aquatic therapy, splinting, ultrasound, iontophoresis, compression bandaging, moist heat, cryotherapy, patient/family education, visual/perceptual remediation/compensation, and DME and/or AE instructions  RECOMMENDED OTHER SERVICES: Currently receiving PT services at this location  CONSULTED AND AGREED WITH PLAN OF CARE: Patient  PLAN FOR NEXT SESSION: Fabrication of custom wrist immobilization orthosis for LUE   Kathrine Cords, MSOT, OTR/L  08/12/2021, 9:09 AM

## 2021-08-12 NOTE — Therapy (Signed)
OUTPATIENT PHYSICAL THERAPY NEURO TREATMENT   Patient Name: Joyce Brown MRN: [id number] DOB:[birth year], 71 y.o., female Today's Date: 08/12/2021  PCP: Talbert Cage, MD REFERRING PROVIDER: Marcial Pacas, MD  END OF SESSION:   PT End of Session - 08/12/21 0841     Visit Number 7    Number of Visits 18    Date for PT Re-Evaluation 09/14/21    Authorization Type Medicare/AARP    PT Start Time 6440    Equipment Utilized During Treatment Gait belt    Activity Tolerance Patient tolerated treatment well    Behavior During Therapy [facility] for tasks assessed/performed;Impulsive               Past Medical History:  Diagnosis Date   Cerebellar ataxia ([place])    Diabetes mellitus without complication ([place])    Diabetic neuropathy ([place]) 12/09/2020   Foot fracture 2022   Gait abnormality 11/07/2018   Hyperlipidemia    Pulmonary embolism ([place])    Sleep apnea    Spinal stenosis    Past Surgical History:  Procedure Laterality Date   ABDOMINAL HYSTERECTOMY     ADENOIDECTOMY     ANKLE SURGERY Left    BILATERAL CARPAL TUNNEL RELEASE     BREAST BIOPSY Left 2019   benign   ELBOW SURGERY     GASTRIC BYPASS  2014   HAND NEUROPLASTY     radio denervation  05/21/2020   TONSILLECTOMY     Patient Active Problem List   Diagnosis Date Noted   H/O bariatric surgery 06/29/2021   Bilateral tennis elbow 06/15/2021   Pharyngitis 04/30/2021   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   Abscess 03/05/2021   Type 1 diabetes mellitus with diabetic polyneuropathy ([place]) 12/25/2020   Urinary frequency 12/16/2020   Diabetic neuropathy ([place]) 12/09/2020   Myofascial pain 09/12/2020   Wheelchair dependence 09/12/2020   Dyslipidemia 06/19/2020   Spondylosis without myelopathy or radiculopathy, lumbar region 04/27/2020   Sensorineural hearing loss (SNHL) of both ears 08/21/2019   Tinnitus, bilateral 06/27/2019   FH: ovarian cancer in first degree relative 11/16/2018   Hypercholesteremia  11/15/2018   Gait abnormality 11/07/2018   Pain of left hip joint 05/18/2018   Chronic midline low back pain with bilateral sciatica 05/18/2018   Breast calcifications 12/28/2017   Fibromyalgia 12/06/2017   History of gastric bypass 12/06/2017   Spinal stenosis 12/06/2017   Hypokalemia 12/06/2017   Asthma 12/06/2017   Cerebellar ataxia ([place])    Type I diabetes mellitus with complication, uncontrolled    Serum albumin decreased 11/10/2017   Vitamin B6 deficiency 11/10/2017   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   Hypoglycemia due to type 1 diabetes mellitus ([place]) 06/20/2016   Hyperparathyroidism ([place]) 10/29/2014   Increased PTH level 10/01/2014   Obstructive sleep apnea on CPAP 10/01/2014   Non-organic enuresis 05/28/2014   Urge incontinence of urine 05/28/2014   History of vaginal hysterectomy 05/24/2014   History of hypertension 04/01/2014   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   Pain of lumbar facet joint 04/11/2013   Mild persistent asthma without complication 34/74/2595   Carpal tunnel syndrome 63/87/5643   Lichen planus 32/95/1884   Adjustment disorder with mixed anxiety and depressed mood 05/11/2012   Degeneration of lumbar intervertebral disc 04/26/2012   Pain in joint involving ankle and foot 04/26/2012   Osteoarthritis of hand 05/24/2011   Leukopenia 09/25/2009    ONSET DATE: 07/06/2021 (MD referral); initial symptoms 2007  REFERRING DIAG: G11.9 (ICD-10-CM) - Cerebellar ataxia ([place]) R26.9 (  ICD-10-CM) - Gait abnormality   THERAPY DIAG:  Other abnormalities of gait and mobility  Unsteadiness on feet  Muscle weakness (generalized)  SUBJECTIVE:                                                                                                                                                                                              SUBJECTIVE STATEMENT: Been doing alright.  Been doing the rotation exercise on the bed before I get up and that  really helps.  Pt accompanied by: self  PERTINENT HISTORY: Has Cerebellar atrophy with ataxia and also chronic low back pain s/p radiofrequency ablation 3/22; Has had trigger point injections for L tennis elbow. Has has Myofascial pain and fibromyalgia.  lumbar spondylosis, chronic LBP  PMH:  DM, neuropathy, closed foot fracture fall of 2022, HLD, PE, spinal stenosis, tennis elbow  PAIN:  Are you having pain? Yes: NPRS scale: 5/10 Pain location: L dorsal forearm Pain description: sharp, achy Aggravating factors: opening objects, extending fingers Relieving factors: trigger point injections (Seeing OT)  PRECAUTIONS: Fall  -hx of Type 1 DM, carries glucose tablets with her; follows the "Rule of 15"  PATIENT GOALS Pt's goals for therapy are to increase strength and mobility.     OBJECTIVE:       TODAY'S TREATMENT: 08/12/21 Activity Comments  Squat pivot transfer w/c>mat, stand pivot transfer mat>w/c  Supervision, min A d/t instability   sitting ab set with 2.2# ball in lap/BUE lifts, 3 x 5 reps Cues for good sitting posture   sitting alt UE/LE lift 2x10 Cues to lift chest upright and raise L arm higher  Seated trunk rotation holding 2.2# ball, 3 x 5 reps Cues for good sitting posture good control  mini squat in II bars 10x, 2 reps Cues to shift hips back when sitting into squat and to push hips forward upon standing up  lateral and A/P weightshifting while weaning UE support to 1 hand CGA and cueing to lift chest, push hips forward, encourage further L wt shift  Resisted sidestep/together yellow theraband, R and L, 2 x 5 reps BUE support in bars, cues for slowed pace  Romberg 2x30" with light BUE support on II bar   Partial tandem stance, 30 sec, 1 rep each, BUE light support   STS using II bars 5x Cueing for transfer set up and avoiding pushing LEs back into w/c; cues to slow down    Pt stands 2:30 prior to sitting.  Needs several seated rest break  Discussed plan for  transition to aquatic therapy-pt feels she isn't quite ready for this yet.  She  is in agreement for PT to begin discussing with aquatic therapists to see when their schedule will be available for her to begin 1x/wk in pool _________________________________________________________________________  OBJECTIVE: measures were taken at time of initial evaluation unless otherwise specified  MMT:    MMT Right 08/12/2021 Left 08/12/2021  Hip flexion 5/5 5/5  Hip extension    Hip abduction 4/5 4/5  Hip adduction 4/5 4/5  Hip internal rotation    Hip external rotation    Knee flexion 5/5 5/5  Knee extension 5/5 5/5  Ankle dorsiflexion 3+/5 3+/5  Ankle plantarflexion    Ankle inversion    Ankle eversion    (Blank rows = not tested)  TRANSFERS: Assistive device utilized: Environmental consultant - 2 wheeled  Sit to stand: SBA Stand to sit: SBA Pt stands at [employer] x 1:26, prior to needing to sit.  GAIT: Gait pattern: step to pattern, step through pattern, ataxic, and wide BOS Distance walked: 20 ft Assistive device utilized: Walker - 2 wheeled Level of assistance: CGA and Min A Comments: With TUG test.  Pt reports difficulty with ramp/incline negotiation.  FUNCTIONAL TESTs:  Timed up and go (TUG): 50.34 sec   HOME EXERCISE PROGRAM: MedBridge Access Code: [email or web address] URL: [email or web address] Date: 07/20/2021 Prepared by: [facility]  _______________________________________________________________________________    GOALS: Goals reviewed with patient? Yes  SHORT TERM GOALS: Target date: 08/17/2021  Pt will be independent with HEP for improved strength, standing, transfers, balance. Baseline: Goal status: IN PROGRESS  2.  Pt perform standing at counter x 3 minutes with UE support for improved standing tolerance. Baseline: 1 min, 26 sec at eval Goal status: IN PROGRESS    LONG TERM GOALS: Target date: 09/14/2021   Pt will be independent with  HEP for improved strength, balance, transfers, and gait. Baseline: initiated Goal status: IN PROGRESS  2.  Pt will perform at least 5 minutes of standing at counter/RW for improved standing balance and performance with ADLs. Baseline: unable Goal status: IN PROGRESS  3.  Pt will improve TUG score to less than or equal to 35 sec for decreased fall risk. Baseline: 50.34 sec Goal status: IN PROGRESS  4.  Berg Balance test to be assessed, with score to improve by at least 5 points for decreased fall risk. Baseline: 19/56 07/1021 Goal status: IN PROGRESS  5.  Pt will verbalize plans for ongoing community fitness, including aquatic fitness options, upon d/c from PT. Baseline:  Goal status: IN PROGRESS   ASSESSMENT:  CLINICAL IMPRESSION: Skilled PT session addressing core stability, transfers, and standing strengthening/balance.  She requires UE support for standing balance and strengthening activities.  At best, she is able to stand 2:30 sec with BUE support.  She is progressing towards goals and will continue to benefit from skilled PT towards goals.    OBJECTIVE IMPAIRMENTS Abnormal gait, decreased activity tolerance, decreased balance, decreased endurance, decreased mobility, difficulty walking, decreased strength, and postural dysfunction.   ACTIVITY LIMITATIONS community activity and community fitness .   PERSONAL FACTORS  PMH:  Cerebellar atrophy with ataxia, chronic low back pain s/p radiofrequency ablation 3/22; trigger point injections for L tennis elbow; myofascial pain and fibromyalgia.  lumbar spondylosis.  DM, neuropathy, closed foot fracture fall of 2022, HLD, PE, spinal stenosis, are also affecting patient's functional outcome.    REHAB POTENTIAL: Good  CLINICAL DECISION MAKING: Evolving/moderate complexity  EVALUATION COMPLEXITY: Moderate  PLAN: PT FREQUENCY: 2x/week  PT DURATION: 8 weeks  including eval  week  PLANNED INTERVENTIONS: Therapeutic exercises,  Therapeutic activity, Neuromuscular re-education, Balance training, Gait training, Patient/Family education, Joint mobilization, DME instructions, Aquatic Therapy, and Manual therapy  PLAN FOR NEXT SESSION: Check STGs.  Continue to progress lower extremity strength and standing balance. Core stability, posture exercises. Standing exercises at counter-try head turns/nods, lateral or A/P weightshifting, reaching tasks.  At some point in POC, plan for transition, maybe 1x/wk to aquatic therapy.   Mady Haagensen, PT 08/12/21 9:34 AM Phone: [phone] Fax: [phone]

## 2021-08-14 ENCOUNTER — Ambulatory Visit: Payer: Medicare Other | Attending: Neurology | Admitting: Physical Therapy

## 2021-08-14 ENCOUNTER — Encounter: Payer: Self-pay | Admitting: Physical Therapy

## 2021-08-14 DIAGNOSIS — R2689 Other abnormalities of gait and mobility: Secondary | ICD-10-CM | POA: Diagnosis not present

## 2021-08-14 DIAGNOSIS — M7711 Lateral epicondylitis, right elbow: Secondary | ICD-10-CM | POA: Diagnosis not present

## 2021-08-14 DIAGNOSIS — R2681 Unsteadiness on feet: Secondary | ICD-10-CM | POA: Diagnosis not present

## 2021-08-14 DIAGNOSIS — R270 Ataxia, unspecified: Secondary | ICD-10-CM | POA: Diagnosis not present

## 2021-08-14 DIAGNOSIS — M7712 Lateral epicondylitis, left elbow: Secondary | ICD-10-CM | POA: Diagnosis not present

## 2021-08-14 DIAGNOSIS — R278 Other lack of coordination: Secondary | ICD-10-CM | POA: Diagnosis not present

## 2021-08-14 DIAGNOSIS — M6281 Muscle weakness (generalized): Secondary | ICD-10-CM

## 2021-08-14 NOTE — Therapy (Signed)
OUTPATIENT PHYSICAL THERAPY NEURO TREATMENT   Patient Name: Joyce Brown MRN: [id number] DOB:January 31, 1951, 71 y.o., female Today's Date: 08/14/2021  PCP: Talbert Cage, MD REFERRING PROVIDER: Marcial Pacas, MD  END OF SESSION:   PT End of Session - 08/14/21 0934     Visit Number 8    Number of Visits 18    Date for PT Re-Evaluation 09/14/21    Authorization Type Medicare/[employer]ARP    PT Start Time 0852    PT Stop Time 0930    PT Time Calculation (min) 38 min    Equipment Utilized During Treatment Gait belt    Activity Tolerance Patient tolerated treatment well    Behavior During Therapy WFL for tasks assessed/performed                Past Medical History:  Diagnosis Date   Cerebellar ataxia ([place])    Diabetes mellitus without complication ([place])    Diabetic neuropathy ([place]) 12/09/2020   Foot fracture 2022   Gait abnormality 11/07/2018   Hyperlipidemia    Pulmonary embolism ([place])    Sleep apnea    Spinal stenosis    Past Surgical History:  Procedure Laterality Date   ABDOMINAL HYSTERECTOMY     ADENOIDECTOMY     ANKLE SURGERY Left    BILATERAL CARPAL TUNNEL RELEASE     BREAST BIOPSY Left 2019   benign   ELBOW SURGERY     GASTRIC BYPASS  2014   HAND NEUROPLASTY     radio denervation  05/21/2020   TONSILLECTOMY     Patient Active Problem List   Diagnosis Date Noted   H/O bariatric surgery 06/29/2021   Bilateral tennis elbow 06/15/2021   Pharyngitis 04/30/2021   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   Abscess 03/05/2021   Type 1 diabetes mellitus with diabetic polyneuropathy ([place]) 12/25/2020   Urinary frequency 12/16/2020   Diabetic neuropathy ([place]) 12/09/2020   Myofascial pain 09/12/2020   Wheelchair dependence 09/12/2020   Dyslipidemia 06/19/2020   Spondylosis without myelopathy or radiculopathy, lumbar region 04/27/2020   Sensorineural hearing loss (SNHL) of both ears 08/21/2019   Tinnitus, bilateral 06/27/2019   FH: ovarian cancer in first  degree relative 11/16/2018   Hypercholesteremia 11/15/2018   Gait abnormality 11/07/2018   Pain of left hip joint 05/18/2018   Chronic midline low back pain with bilateral sciatica 05/18/2018   Breast calcifications 12/28/2017   Fibromyalgia 12/06/2017   History of gastric bypass 12/06/2017   Spinal stenosis 12/06/2017   Hypokalemia 12/06/2017   Asthma 12/06/2017   Cerebellar ataxia ([place])    Type I diabetes mellitus with complication, uncontrolled    Serum albumin decreased 11/10/2017   Vitamin B6 deficiency 11/10/2017   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   Hypoglycemia due to type 1 diabetes mellitus ([place]) 06/20/2016   Hyperparathyroidism ([place]) 10/29/2014   Increased PTH level 10/01/2014   Obstructive sleep apnea on CPAP 10/01/2014   Non-organic enuresis 05/28/2014   Urge incontinence of urine 05/28/2014   History of vaginal hysterectomy 05/24/2014   History of hypertension 04/01/2014   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   Pain of lumbar facet joint 04/11/2013   Mild persistent asthma without complication 14/43/1540   Carpal tunnel syndrome 08/67/6195   Lichen planus 09/32/6712   Adjustment disorder with mixed anxiety and depressed mood 05/11/2012   Degeneration of lumbar intervertebral disc 04/26/2012   Pain in joint involving ankle and foot 04/26/2012   Osteoarthritis of hand 05/24/2011   Leukopenia 09/25/2009    ONSET  DATE: 07/06/2021 (MD referral); initial symptoms 2007  REFERRING DIAG: G11.9 (ICD-10-CM) - Cerebellar ataxia ([place]) R26.9 (ICD-10-CM) - Gait abnormality   THERAPY DIAG:  Muscle weakness (generalized)  Unsteadiness on feet  SUBJECTIVE:                                                                                                                                                                                              SUBJECTIVE STATEMENT: Had a little more stiffness with the trunk rotation exercise, but I paid attention to  keeping my hips steady, and that seems to help.  Pt accompanied by: self  PERTINENT HISTORY: Has Cerebellar atrophy with ataxia and also chronic low back pain s/p radiofrequency ablation 3/22; Has had trigger point injections for L tennis elbow. Has has Myofascial pain and fibromyalgia.  lumbar spondylosis, chronic LBP  PMH:  DM, neuropathy, closed foot fracture fall of 2022, HLD, PE, spinal stenosis, tennis elbow  PAIN:  Are you having pain? Yes: NPRS scale: 5/10 Pain location: L dorsal forearm Pain description: sharp, achy Aggravating factors: opening objects, extending fingers Relieving factors: trigger point injections (Seeing OT)  PRECAUTIONS: Fall  -hx of Type 1 DM, carries glucose tablets with her; follows the "Rule of 15"  PATIENT GOALS Pt's goals for therapy are to increase strength and mobility.     OBJECTIVE:    TODAY'S TREATMENT: 08/14/2021 Activity Comments  sitting alt UE/lower extremity lift 2x10 Cues for abdominal activation and control  sitting ab set with 2.2# ball in lap/BUE lifts,  3 x 5 reps Cues throughout seated ex for abdominal activation, control  Seated LAQ, 1.5# BLE weights, while holding ab set, 2.2# ball, 3 x 10 reps   Seated trunk rotation holding 2.2# ball, 3 x 5 reps   Seated march, 2 x 10 reps, no weight; then 3rd set of 10, 1.5# weight   Seated step out and in, 1.5# over low obstacle, 2 x 10 reps   Standing at counter with BUE support: Feet apart, 3 minutes with conversation tasks Feet together, 1 minute, 2 reps Cues for glut activation    -Reporting improved flexibility, more awareness of movement patterns-trying to slow down how she moves.  Strength and stamina "depends on the day" -Verbally reviewed full HEP and pt verbalizes understanding. Squat pivot transfer w/c<>mat with supervision    _________________________________________________________________________  OBJECTIVE: measures were taken at time of initial evaluation unless  otherwise specified  MMT:    MMT Right 08/14/2021 Left 08/14/2021  Hip flexion 5/5 5/5  Hip extension    Hip abduction 4/5 4/5  Hip adduction 4/5 4/5  Hip internal  rotation    Hip external rotation    Knee flexion 5/5 5/5  Knee extension 5/5 5/5  Ankle dorsiflexion 3+/5 3+/5  Ankle plantarflexion    Ankle inversion    Ankle eversion    (Blank rows = not tested)  TRANSFERS: Assistive device utilized: Environmental consultant - 2 wheeled  Sit to stand: SBA Stand to sit: SBA Pt stands at [employer] x 1:26, prior to needing to sit.  GAIT: Gait pattern: step to pattern, step through pattern, ataxic, and wide BOS Distance walked: 20 ft Assistive device utilized: Walker - 2 wheeled Level of assistance: CGA and Min A Comments: With TUG test.  Pt reports difficulty with ramp/incline negotiation.  FUNCTIONAL TESTs:  Timed up and go (TUG): 50.34 sec   HOME EXERCISE PROGRAM: MedBridge Access Code: [email or web address] URL: [email or web address] Date: 07/20/2021 Prepared by: [facility]  _______________________________________________________________________________    GOALS: Goals reviewed with patient? Yes  SHORT TERM GOALS: Target date: 08/17/2021  Pt will be independent with HEP for improved strength, standing, transfers, balance. Baseline: Goal status: GOAL MET  2.  Pt perform standing at counter x 3 minutes with UE support for improved standing tolerance. Baseline: 1 min, 26 sec at eval Goal status: GOAL MET    LONG TERM GOALS: Target date: 09/14/2021   Pt will be independent with HEP for improved strength, balance, transfers, and gait. Baseline: initiated Goal status: IN PROGRESS  2.  Pt will perform at least 5 minutes of standing at counter/RW for improved standing balance and performance with ADLs. Baseline: unable Goal status: IN PROGRESS  3.  Pt will improve TUG score to less than or equal to 35 sec for decreased fall risk. Baseline: 50.34  sec Goal status: IN PROGRESS  4.  Berg Balance test to be assessed, with score to improve by at least 5 points for decreased fall risk. Baseline: 19/56 07/22/21 Goal status: IN PROGRESS  5.  Pt will verbalize plans for ongoing community fitness, including aquatic fitness options, upon d/c from PT. Baseline:  Goal status: IN PROGRESS   ASSESSMENT:  CLINICAL IMPRESSION: Assessed STGs, with pt meeting 2 of 2 STGs.  She feels she has improved with flexibility and with increased awareness of movement patterns, resulting in safer mobility since start of PT.  She is progressing with strength and stamina for standing, but she continues to require UE support for all standing tasks.  Will continue to work towards [place] for imrpoved funcitonal mobility.   OBJECTIVE IMPAIRMENTS Abnormal gait, decreased activity tolerance, decreased balance, decreased endurance, decreased mobility, difficulty walking, decreased strength, and postural dysfunction.   ACTIVITY LIMITATIONS community activity and community fitness .   PERSONAL FACTORS  PMH:  Cerebellar atrophy with ataxia, chronic low back pain s/p radiofrequency ablation 3/22; trigger point injections for L tennis elbow; myofascial pain and fibromyalgia.  lumbar spondylosis.  DM, neuropathy, closed foot fracture fall of 2022, HLD, PE, spinal stenosis, are also affecting patient's functional outcome.    REHAB POTENTIAL: Good  CLINICAL DECISION MAKING: Evolving/moderate complexity  EVALUATION COMPLEXITY: Moderate  PLAN: PT FREQUENCY: 2x/week  PT DURATION: 8 weeks  including eval week  PLANNED INTERVENTIONS: Therapeutic exercises, Therapeutic activity, Neuromuscular re-education, Balance training, Gait training, Patient/Family education, Joint mobilization, DME instructions, Aquatic Therapy, and Manual therapy  PLAN FOR NEXT SESSION:  Work on short distance gait, progression of standing exercises.Continue to progress lower extremity strength and  standing balance. Core stability, posture exercises. Standing exercises at counter-try head turns/nods, lateral  or A/P weightshifting, reaching tasks.  At some point in POC, plan for transition, maybe 1x/wk to aquatic therapy.   Mady Haagensen, PT 08/14/21 9:46 AM Phone: [phone] Fax: [phone]

## 2021-08-17 ENCOUNTER — Ambulatory Visit: Payer: Medicare Other | Admitting: Physical Therapy

## 2021-08-17 ENCOUNTER — Encounter: Payer: Self-pay | Admitting: Physical Therapy

## 2021-08-17 ENCOUNTER — Ambulatory Visit: Payer: Medicare Other | Admitting: Occupational Therapy

## 2021-08-17 DIAGNOSIS — R2689 Other abnormalities of gait and mobility: Secondary | ICD-10-CM | POA: Diagnosis not present

## 2021-08-17 DIAGNOSIS — M6281 Muscle weakness (generalized): Secondary | ICD-10-CM

## 2021-08-17 DIAGNOSIS — R270 Ataxia, unspecified: Secondary | ICD-10-CM | POA: Diagnosis not present

## 2021-08-17 DIAGNOSIS — R278 Other lack of coordination: Secondary | ICD-10-CM | POA: Diagnosis not present

## 2021-08-17 DIAGNOSIS — R2681 Unsteadiness on feet: Secondary | ICD-10-CM | POA: Diagnosis not present

## 2021-08-17 DIAGNOSIS — M7712 Lateral epicondylitis, left elbow: Secondary | ICD-10-CM | POA: Diagnosis not present

## 2021-08-17 NOTE — Therapy (Signed)
OUTPATIENT OCCUPATIONAL THERAPY TREATMENT NOTE  Patient Name: Joyce Brown MRN: [id number] DOB:March 09, 1951, 71 y.o., female Today's Date: 08/17/2021  PCP: Talbert Cage, MD REFERRING PROVIDER: Marcial Pacas, MD   OT End of Session - 08/17/21 1010     Visit Number 4    Number of Visits 17    Date for OT Re-Evaluation 10/07/21    Authorization Type Medicare    Progress Note Due on Visit 10    OT Start Time 0933    OT Stop Time 1015    OT Time Calculation (min) 42 min    Activity Tolerance Patient tolerated treatment well    Behavior During Therapy [facility] for tasks assessed/performed             Past Medical History:  Diagnosis Date   Cerebellar ataxia ([place])    Diabetes mellitus without complication ([place])    Diabetic neuropathy ([place]) 12/09/2020   Foot fracture 2022   Gait abnormality 11/07/2018   Hyperlipidemia    Pulmonary embolism ([place])    Sleep apnea    Spinal stenosis    Past Surgical History:  Procedure Laterality Date   ABDOMINAL HYSTERECTOMY     ADENOIDECTOMY     ANKLE SURGERY Left    BILATERAL CARPAL TUNNEL RELEASE     BREAST BIOPSY Left 2019   benign   ELBOW SURGERY     GASTRIC BYPASS  2014   HAND NEUROPLASTY     radio denervation  05/21/2020   TONSILLECTOMY     Patient Active Problem List   Diagnosis Date Noted   H/O bariatric surgery 06/29/2021   Bilateral tennis elbow 06/15/2021   Pharyngitis 04/30/2021   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   Abscess 03/05/2021   Type 1 diabetes mellitus with diabetic polyneuropathy ([place]) 12/25/2020   Urinary frequency 12/16/2020   Diabetic neuropathy ([place]) 12/09/2020   Myofascial pain 09/12/2020   Wheelchair dependence 09/12/2020   Dyslipidemia 06/19/2020   Spondylosis without myelopathy or radiculopathy, lumbar region 04/27/2020   Sensorineural hearing loss (SNHL) of both ears 08/21/2019   Tinnitus, bilateral 06/27/2019   FH: ovarian cancer in first degree relative 11/16/2018    Hypercholesteremia 11/15/2018   Gait abnormality 11/07/2018   Pain of left hip joint 05/18/2018   Chronic midline low back pain with bilateral sciatica 05/18/2018   Breast calcifications 12/28/2017   Fibromyalgia 12/06/2017   History of gastric bypass 12/06/2017   Spinal stenosis 12/06/2017   Hypokalemia 12/06/2017   Asthma 12/06/2017   Cerebellar ataxia ([place])    Type I diabetes mellitus with complication, uncontrolled    Serum albumin decreased 11/10/2017   Vitamin B6 deficiency 11/10/2017   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   Hypoglycemia due to type 1 diabetes mellitus ([place]) 06/20/2016   Hyperparathyroidism ([place]) 10/29/2014   Increased PTH level 10/01/2014   Obstructive sleep apnea on CPAP 10/01/2014   Non-organic enuresis 05/28/2014   Urge incontinence of urine 05/28/2014   History of vaginal hysterectomy 05/24/2014   History of hypertension 04/01/2014   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   Pain of lumbar facet joint 04/11/2013   Mild persistent asthma without complication 35/32/9924   Carpal tunnel syndrome 26/83/4196   Lichen planus 22/29/7989   Adjustment disorder with mixed anxiety and depressed mood 05/11/2012   Degeneration of lumbar intervertebral disc 04/26/2012   Pain in joint involving ankle and foot 04/26/2012   Osteoarthritis of hand 05/24/2011   Leukopenia 09/25/2009    ONSET DATE: 07/22/21 (referral date)  REFERRING DIAG:  R26.89 (ICD-10-CM) - Other abnormalities of gait and mobility R29.818 (ICD-10-CM) - Other symptoms and signs involving the nervous system   THERAPY DIAG:  Muscle weakness (generalized)  Ataxia  Other lack of coordination  SUBJECTIVE:   SUBJECTIVE STATEMENT: Pt reports bringing her knitting needles for session to problem solve grip. 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 Yes: NPRS scale: 4/10 Pain location: slightly distal to L elbow Pain description: neuropathic pain Aggravating factors: movement, specific positions (supination, reaching to the left) Relieving factors: pain patches, warm pack  PATIENT GOALS Stretches for tennis elbow; increase dexterity   OBJECTIVE:   ADLs: Overall ADLs: Reports Mod I w/ all BADLs Transfers/ambulation related to ADLs: w/c for in-home mobility; RW for transfers in/out of bathroom Equipment: Transfer tub bench, Grab bars, Reacher, and elevated toilet seat  IADLs: Shopping: w/c mobility to and from grocery; lives very close to a [employer] housekeeping: Able to complete light tasks; assist for heavier housekeeping Meal Prep: Microwave meal prep, simple snack prep Community mobility: Uses [employer] [place] paratransit services; has not driven in > 10 years Medication management: Independent Financial management: Independent Handwriting:  Unable to be assessed at evaluation; to be further assessed prn   TODAY'S TREATMENT - 08/17/21: Therapist fit pt knitting needles with yellow built up foam handles to increase grasp PRN.  Pt then completed a row of stitches without use of foam, but reports plan to utilize as needed.   Coordination activities with RUE and then LUE, including: rotating cards in-hand/turning cards end-over end completing clockwise then counter clockwise, pushing cards off deck held in-hand using thumb only. Coordination activities with RUE and then LUE, including: rotating ball in finger tips clockwise and counter clockwise, rotating two  dice in hand simultaneously for improved coordination.  Stacking dice up to 5 high to add up to provided number to challenge in-hand manipulation and rotation with stacking activity.  Pt did not drop any of the dice, but demonstrating increased compensatory techniques with shoulder hike.  Pt able to recognize compensation, but with decreased ability to correct during task.    PATIENT EDUCATION: Education provided on: on-going coordination tasks and use of built up handles for variety of tasks. Person educated: Patient Education method: Customer service manager Education comprehension: verbalized understanding   HOME EXERCISE PROGRAM: To be administered   GOALS: Goals reviewed with patient? Yes  SHORT TERM GOALS: Target date: 08/28/2021  STG  Status:  1 Pt will verbalize understanding of at least 2 precautions and positional considerations related to worsening of lateral epicondylitis symptoms Baseline: Decreased knowledge of precautions Achieved - 08/05/21  2 Pt will demonstrate understanding of initial HEP designed for increased BUE GMC, ROM, and stretching Baseline: No HEP at this time In progress  3 Pt will demonstrate understanding of at least 1 visual compensatory strategy to reduce functional impact of visual perceptual limitations Baseline: Reports diplopia; no currently compensatory strategies In progress    LONG TERM GOALS: Target date: 09/25/2021  LTG  Status:  1 Pt will demonstrate understanding of  initial HEP designed for increased BUE GMC, ROM, and stretching Baseline: No HEP at this time In progress  2 Pt will improve L hand grip strength by at least 9 lbs w/ pain less than 2/10 by discharge Baseline: 35 lbs (RUE 58 lbs) In progress   3 Pt will increase Box and Blocks score bilaterally by at least 3 blocks to demonstrate improved GMC Baseline: 34 w/ RUE; 26 w/ LUE In progress  4 Pt will be able to grip and lift a lightweight object (e.g., coffee mug) w/out pain in  L elbow Baseline: Pain w/ grasp and lifting In progress  5 Pt will decrease time to complete 9-HPT by at least 4 seconds to indicate improved control and coordination for functional bilateral FM tasks Baseline: 54.9 sec (RUE 38.5 sec) In progress     ASSESSMENT:  CLINICAL IMPRESSION: Due to clinic scheduling conflict, OT was unable to fabricate custom-fit wrist immobilization orthosis this session; to be addressed next session. OT Continued to focus on [facility], per pt request, reviewing previous exercises and providing increased challenge as appropriate. Pt is able to recognize when utilizing compensatory techniques and body positioning, but has intermittent success with correcting during task.  PERFORMANCE DEFICITS in functional skills including ADLs, IADLs, coordination, dexterity, sensation, tone, ROM, strength, pain, FMC, GMC, mobility, balance, body mechanics, decreased knowledge of precautions, decreased knowledge of use of DME, vision, and UE functional use.   IMPAIRMENTS are limiting patient from ADLs, IADLs, rest and sleep, leisure, and social participation.   COMORBIDITIES has co-morbidities such as DM w/ neuropathy and retinopathy, chronic low back pain, asthma, and myofascial pain  that affects occupational performance. Patient will benefit from skilled OT to address above impairments and improve overall function.   PLAN: OT FREQUENCY: 2x/week  OT DURATION: 8 weeks  PLANNED INTERVENTIONS: self care/ADL training, therapeutic exercise, therapeutic activity, neuromuscular re-education, manual therapy, passive range of motion, functional mobility training, aquatic therapy, splinting, ultrasound, iontophoresis, compression bandaging, moist heat, cryotherapy, patient/family education, visual/perceptual remediation/compensation, and DME and/or AE instructions  RECOMMENDED OTHER SERVICES: Currently receiving PT services at this location  CONSULTED AND AGREED WITH PLAN OF CARE:  Patient  PLAN FOR NEXT SESSION: Fabrication of custom wrist immobilization orthosis for LUE   Simonne Come, OTR/L 08/17/2021, 9:09 AM

## 2021-08-17 NOTE — Patient Instructions (Signed)
Access Code: [email or web address] URL: [email or web address] Date: 08/17/2021 Prepared by: [facility]  Exercises - Seated Gluteal Sets  - 1 x daily - 7 x weekly - 2 sets - 10 reps - 3 sec hold - Seated Active Hip Flexion  - 1 x daily - 7 x weekly - 2 sets - 10 reps - 5 sec hold - Seated Shoulder Row with Anchored Resistance  - 1 x daily - 5 x weekly - 2 sets - 10 reps - Seated Shoulder Extension and Scapular Retraction with Resistance  - 1 x daily - 5 x weekly - 2 sets - 10 reps - Modified Thomas Stretch  - 1 x daily - 5 x weekly - 2 sets - 30 sec hold - Sit to Stand with Counter Support  - 1 x daily - 5 x weekly - 2 sets - 5 reps - Lower Trunk Rotations  - 1-2 x daily - 7 x weekly - 1 sets - 5-10 reps - Hooklying Isometric Clamshell  - 1 x daily - 5 x weekly - 1-2 sets - 10 reps  Added 08/17/2021: - Seated Heel Raise  - 1 x daily - 5 x weekly - 1-2 sets - 10 reps

## 2021-08-17 NOTE — Therapy (Signed)
OUTPATIENT PHYSICAL THERAPY NEURO TREATMENT   Patient Name: Joyce Brown MRN: [id number] DOB:October 12, 1950, 71 y.o., female Today's Date: 08/17/2021  PCP: Talbert Cage, MD REFERRING PROVIDER: Marcial Pacas, MD  END OF SESSION:   PT End of Session - 08/17/21 0816     Visit Number 9    Number of Visits 18    Date for PT Re-Evaluation 09/14/21    Authorization Type Medicare/AARP    PT Start Time 0825    PT Stop Time 0907    PT Time Calculation (min) 42 min    Equipment Utilized During Treatment Gait belt    Activity Tolerance Patient tolerated treatment well    Behavior During Therapy WFL for tasks assessed/performed                 Past Medical History:  Diagnosis Date   Cerebellar ataxia ([place])    Diabetes mellitus without complication ([place])    Diabetic neuropathy ([place]) 12/09/2020   Foot fracture 2022   Gait abnormality 11/07/2018   Hyperlipidemia    Pulmonary embolism ([place])    Sleep apnea    Spinal stenosis    Past Surgical History:  Procedure Laterality Date   ABDOMINAL HYSTERECTOMY     ADENOIDECTOMY     ANKLE SURGERY Left    BILATERAL CARPAL TUNNEL RELEASE     BREAST BIOPSY Left 2019   benign   ELBOW SURGERY     GASTRIC BYPASS  2014   HAND NEUROPLASTY     radio denervation  05/21/2020   TONSILLECTOMY     Patient Active Problem List   Diagnosis Date Noted   H/O bariatric surgery 06/29/2021   Bilateral tennis elbow 06/15/2021   Pharyngitis 04/30/2021   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   Abscess 03/05/2021   Type 1 diabetes mellitus with diabetic polyneuropathy ([place]) 12/25/2020   Urinary frequency 12/16/2020   Diabetic neuropathy ([place]) 12/09/2020   Myofascial pain 09/12/2020   Wheelchair dependence 09/12/2020   Dyslipidemia 06/19/2020   Spondylosis without myelopathy or radiculopathy, lumbar region 04/27/2020   Sensorineural hearing loss (SNHL) of both ears 08/21/2019   Tinnitus, bilateral 06/27/2019   FH: ovarian cancer in first  degree relative 11/16/2018   Hypercholesteremia 11/15/2018   Gait abnormality 11/07/2018   Pain of left hip joint 05/18/2018   Chronic midline low back pain with bilateral sciatica 05/18/2018   Breast calcifications 12/28/2017   Fibromyalgia 12/06/2017   History of gastric bypass 12/06/2017   Spinal stenosis 12/06/2017   Hypokalemia 12/06/2017   Asthma 12/06/2017   Cerebellar ataxia ([place])    Type I diabetes mellitus with complication, uncontrolled    Serum albumin decreased 11/10/2017   Vitamin B6 deficiency 11/10/2017   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   Hypoglycemia due to type 1 diabetes mellitus ([place]) 06/20/2016   Hyperparathyroidism ([place]) 10/29/2014   Increased PTH level 10/01/2014   Obstructive sleep apnea on CPAP 10/01/2014   Non-organic enuresis 05/28/2014   Urge incontinence of urine 05/28/2014   History of vaginal hysterectomy 05/24/2014   History of hypertension 04/01/2014   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   Pain of lumbar facet joint 04/11/2013   Mild persistent asthma without complication 65/68/1275   Carpal tunnel syndrome 17/00/1749   Lichen planus 44/96/7591   Adjustment disorder with mixed anxiety and depressed mood 05/11/2012   Degeneration of lumbar intervertebral disc 04/26/2012   Pain in joint involving ankle and foot 04/26/2012   Osteoarthritis of hand 05/24/2011   Leukopenia 09/25/2009  ONSET DATE: 07/06/2021 (MD referral); initial symptoms 2007  REFERRING DIAG: G11.9 (ICD-10-CM) - Cerebellar ataxia ([place]) R26.9 (ICD-10-CM) - Gait abnormality   THERAPY DIAG:  Muscle weakness (generalized)  Unsteadiness on feet  Other abnormalities of gait and mobility  SUBJECTIVE:                                                                                                                                                                                              SUBJECTIVE STATEMENT: Nothing new.Gotten into the good habit of  doing my exercise routine.  Pt accompanied by: self  PERTINENT HISTORY: Has Cerebellar atrophy with ataxia and also chronic low back pain s/p radiofrequency ablation 3/22; Has had trigger point injections for L tennis elbow. Has has Myofascial pain and fibromyalgia.  lumbar spondylosis, chronic LBP  PMH:  DM, neuropathy, closed foot fracture fall of 2022, HLD, PE, spinal stenosis, tennis elbow  PAIN:  Are you having pain? Yes: NPRS scale: 5/10 Pain location: L dorsal forearm Pain description: sharp, achy Aggravating factors: opening objects, extending fingers Relieving factors: trigger point injections (Seeing OT)  PRECAUTIONS: Fall  -hx of Type 1 DM, carries glucose tablets with her; follows the "Rule of 15"  PATIENT GOALS Pt's goals for therapy are to increase strength and mobility.     OBJECTIVE:   TODAY'S TREATMENT: 08/17/2021 Activity Comments  Sit<>stand from 19" surface, x 5 reps Improved control with sit<>stand with reps  Gait x 12 ft, with RW  Min guard  TUG score 56.72 sec Slowed pace with turns  TUG shuttle, 10-20 ft distances with RW, 3 reps Min guard and cues for foot placement in BOS of walker, to improve push-off with foot to improve forward momentum with gait  Gait 16 ft x 2 reps, with RW and min guard Cues for improved turning technique  Short distance gait 12 ft mat>counter, with RW Min guard  Standing pre-gait: Stagger stance forward/back rocking, 5 reps Marching in place, x 5 PT holding walker for steadiness  Seated ankle exercises: Bilat heel raises 2 x 10 Alternating heel raises 2 x 10   Standing heel raises x 5 reps at sink Moderate LOB and PT assists to regain balance  NuStep, Level 2, 4 extremities x 5 minutes, steps/min > 50 Initially tried BLEs only, with added UEs helped with smoother motion throughout.   Multiple reps of sit<>stand from mat surface to walker, between activities with min guard assist  Squat pivot transfer w/c<>mat with  supervision, and w/c <>NuStep     _________________________________________________________________________  OBJECTIVE: measures were taken at time of initial evaluation unless otherwise specified  MMT:    MMT Right 08/17/2021 Left 08/17/2021  Hip flexion 5/5 5/5  Hip extension    Hip abduction 4/5 4/5  Hip adduction 4/5 4/5  Hip internal rotation    Hip external rotation    Knee flexion 5/5 5/5  Knee extension 5/5 5/5  Ankle dorsiflexion 3+/5 3+/5  Ankle plantarflexion    Ankle inversion    Ankle eversion    (Blank rows = not tested)  TRANSFERS: Assistive device utilized: Environmental consultant - 2 wheeled  Sit to stand: SBA Stand to sit: SBA Pt stands at [employer] x 1:26, prior to needing to sit.  GAIT: Gait pattern: step to pattern, step through pattern, ataxic, and wide BOS Distance walked: 20 ft Assistive device utilized: Walker - 2 wheeled Level of assistance: CGA and Min A Comments: With TUG test.  Pt reports difficulty with ramp/incline negotiation.  FUNCTIONAL TESTs:  Timed up and go (TUG): 50.34 sec   HOME EXERCISE PROGRAM: MedBridge Access Code: [email or web address] URL: [email or web address] Date: 07/20/2021 Prepared by: [facility]  _______________________________________________________________________________    GOALS: Goals reviewed with patient? Yes  SHORT TERM GOALS: Target date: 08/17/2021  Pt will be independent with HEP for improved strength, standing, transfers, balance. Baseline: Goal status: GOAL MET  2.  Pt perform standing at counter x 3 minutes with UE support for improved standing tolerance. Baseline: 1 min, 26 sec at eval Goal status: GOAL MET    LONG TERM GOALS: Target date: 09/14/2021   Pt will be independent with HEP for improved strength, balance, transfers, and gait. Baseline: initiated Goal status: IN PROGRESS  2.  Pt will perform at least 5 minutes of standing at counter/RW for improved standing  balance and performance with ADLs. Baseline: unable Goal status: IN PROGRESS  3.  Pt will improve TUG score to less than or equal to 35 sec for decreased fall risk. Baseline: 50.34 sec Goal status: IN PROGRESS  4.  Berg Balance test to be assessed, with score to improve by at least 5 points for decreased fall risk. Baseline: 19/56 07/22/21 Goal status: IN PROGRESS  5.  Pt will verbalize plans for ongoing community fitness, including aquatic fitness options, upon d/c from PT. Baseline:  Goal status: IN PROGRESS   ASSESSMENT:  CLINICAL IMPRESSION: Skilled PT session today focused on sit<>stand transfers and short distance gait progression.  Pt initially has more tendency for posterior weightshift with gait, but with cues, she responds well to improved heel-toe pattern and push-off with gait.  Attempted heel raises at counter, and pt experiences moderate LOB, able to regain balance with PT assist.  Introduced aerobic activity using Nustep x 5 minutes for lower extremity strengthening.  She is progressing well towards LTGs.   OBJECTIVE IMPAIRMENTS Abnormal gait, decreased activity tolerance, decreased balance, decreased endurance, decreased mobility, difficulty walking, decreased strength, and postural dysfunction.   ACTIVITY LIMITATIONS community activity and community fitness .   PERSONAL FACTORS  PMH:  Cerebellar atrophy with ataxia, chronic low back pain s/p radiofrequency ablation 3/22; trigger point injections for L tennis elbow; myofascial pain and fibromyalgia.  lumbar spondylosis.  DM, neuropathy, closed foot fracture fall of 2022, HLD, PE, spinal stenosis, are also affecting patient's functional outcome.    REHAB POTENTIAL: Good  CLINICAL DECISION MAKING: Evolving/moderate complexity  EVALUATION COMPLEXITY: Moderate  PLAN: PT FREQUENCY: 2x/week  PT DURATION: 8 weeks  including eval week  PLANNED INTERVENTIONS: Therapeutic exercises, Therapeutic activity, Neuromuscular  re-education, Balance training, Gait training, Patient/Family  education, Joint mobilization, DME instructions, Aquatic Therapy, and Manual therapy  PLAN FOR NEXT SESSION:  10th Visit Progress note next visit.  Continue to work on short distance gait, progression of standing exercises.Continue to progress lower extremity strength and standing balance. Core stability, posture exercises. Standing exercises at counter-try head turns/nods, lateral or A/P weightshifting, reaching tasks.  At some point in POC, plan for transition, maybe 1x/wk to aquatic therapy. *(heard from [facility] about aquatic therapy-they would have appts available around end of June, so consider trying to get scheduled to trial)   Mady Haagensen, PT 08/17/21 9:17 AM Phone: [phone] Fax: [phone]

## 2021-08-18 ENCOUNTER — Encounter: Payer: Self-pay | Admitting: Family Medicine

## 2021-08-18 ENCOUNTER — Other Ambulatory Visit: Payer: Self-pay

## 2021-08-18 ENCOUNTER — Encounter: Payer: Self-pay | Admitting: *Deleted

## 2021-08-18 ENCOUNTER — Ambulatory Visit (INDEPENDENT_AMBULATORY_CARE_PROVIDER_SITE_OTHER): Payer: Medicare Other | Admitting: Family Medicine

## 2021-08-18 DIAGNOSIS — D099 Carcinoma in situ, unspecified: Secondary | ICD-10-CM

## 2021-08-18 MED ORDER — NYSTATIN 100000 UNIT/ML MT SUSP: 5.0000 mL | Freq: Four times a day (QID) | OROMUCOSAL | 2 refills | Status: DC

## 2021-08-18 NOTE — Progress Notes (Signed)
    SUBJECTIVE:   CHIEF COMPLAINT / HPI:   Skin Lesion SCC in situ Feels this is healing well.  Can no longer feel induration.  No pain or discharge     Dry Mouth Pharyngitis Only issue if pain in roof of her mouth.  This improved with Nystatin but recurred when stopped.  Her dentist recommended ENT consult. This is scheduled for July Swallowing well.   Urinary incontinence Markedly improved with Myrbetriq  EOL Forms I dicussed and signed her MOST form.  She thinks her sister will be her HCPOA but does not have this set up    [place]  PMH / [place]: Her endocrinologist is adjusting her diabetes medications   OBJECTIVE:   BP 133/68   Pulse 83   Wt 185 lb 12.8 oz (84.3 kg)   SpO2 97%   BMI 26.66 kg/m   Mouth - Palate - erythematous with white spots.  Throat appears normal  Heart - Regular rate and rhythm.  No murmurs, gallops or rubs.    Lungs:  Normal respiratory effort, chest expands symmetrically. Lungs are clear to auscultation, no crackles or wheezes. Leg - well healed scar without signs of epithelial disruption     ASSESSMENT/PLAN:   Squamous cell carcinoma in situ SP ED&C.  Healing well.  Continue to monitor   Pharyngitis Seems now to be confined to her hard palate.  Appears to be a candidal infection.  Will try Nystatin as a therapeutic trial.  Recurrence could be due to her cellcept   EOL Discussion Gave [place] forms and recommend she complete and designate a HCPOA   Patient Instructions  Good to see you today - Thank you for coming in  Things we discussed today:  Complete the HCPOA form  I sent in Nysatin - use for 2 weeks to see if improves.  May need to use intermittently  Your goal blood pressure is less than 140/90.  Check your blood pressure several times a week.  If regularly higher than this please let me know - either with MyChart or leaving a phone message. Next visit please bring in your blood pressure cuff.     Use vaseline on the leg wound twice  a day.  Let me know if worsening in any way  Please always bring your medication bottles  Come back to see me in 12 months   Lind Covert, [place]

## 2021-08-18 NOTE — Assessment & Plan Note (Signed)
Seems now to be confined to her hard palate.  Appears to be a candidal infection.  Will try Nystatin as a therapeutic trial.  Recurrence could be due to her cellcept

## 2021-08-18 NOTE — Assessment & Plan Note (Signed)
SP ED&C.  Healing well.  Continue to monitor

## 2021-08-18 NOTE — Patient Instructions (Signed)
Good to see you today - Thank you for coming in  Things we discussed today:  Complete the HCPOA form  I sent in Nysatin - use for 2 weeks to see if improves.  May need to use intermittently  Your goal blood pressure is less than 140/90.  Check your blood pressure several times a week.  If regularly higher than this please let me know - either with MyChart or leaving a phone message. Next visit please bring in your blood pressure cuff.     Use vaseline on the leg wound twice a day.  Let me know if worsening in any way  Please always bring your medication bottles  Come back to see me in 12 months

## 2021-08-19 ENCOUNTER — Encounter: Payer: Self-pay | Admitting: Occupational Therapy

## 2021-08-19 ENCOUNTER — Ambulatory Visit: Payer: Medicare Other | Admitting: Physical Therapy

## 2021-08-19 ENCOUNTER — Ambulatory Visit: Payer: Medicare Other

## 2021-08-19 ENCOUNTER — Ambulatory Visit: Payer: Medicare Other | Admitting: Occupational Therapy

## 2021-08-19 DIAGNOSIS — M7711 Lateral epicondylitis, right elbow: Secondary | ICD-10-CM

## 2021-08-19 DIAGNOSIS — M7712 Lateral epicondylitis, left elbow: Secondary | ICD-10-CM

## 2021-08-19 DIAGNOSIS — M6281 Muscle weakness (generalized): Secondary | ICD-10-CM

## 2021-08-19 DIAGNOSIS — R270 Ataxia, unspecified: Secondary | ICD-10-CM

## 2021-08-19 DIAGNOSIS — R278 Other lack of coordination: Secondary | ICD-10-CM

## 2021-08-19 DIAGNOSIS — R2681 Unsteadiness on feet: Secondary | ICD-10-CM

## 2021-08-19 DIAGNOSIS — R2689 Other abnormalities of gait and mobility: Secondary | ICD-10-CM

## 2021-08-19 NOTE — Therapy (Signed)
OUTPATIENT PHYSICAL THERAPY NEURO TREATMENT and Progress Note   Patient Name: Midori Dado MRN: [id number] DOB:[birth year], 71 y.o., female Today's Date: 08/19/2021  PCP: Talbert Cage, MD REFERRING PROVIDER: Marcial Pacas, MD Progress Note Reporting Period 07/20/21 to 08/19/21  See note below for Objective Data and Assessment of Progress/Goals.      END OF SESSION:   PT End of Session - 08/19/21 0751     Visit Number 10    Number of Visits 18    Date for PT Re-Evaluation 09/14/21    Authorization Type Medicare/[employer]ARP    Progress Note Due on Visit 43    PT Start Time 0800    PT Stop Time 0845    PT Time Calculation (min) 45 min    Equipment Utilized During Treatment Gait belt    Activity Tolerance Patient tolerated treatment well    Behavior During Therapy WFL for tasks assessed/performed                 Past Medical History:  Diagnosis Date   Cerebellar ataxia ([place])    Diabetes mellitus without complication ([place])    Diabetic neuropathy ([place]) 12/09/2020   Foot fracture 2022   Gait abnormality 11/07/2018   Hyperlipidemia    Pulmonary embolism ([place])    Sleep apnea    Spinal stenosis    Past Surgical History:  Procedure Laterality Date   ABDOMINAL HYSTERECTOMY     ADENOIDECTOMY     ANKLE SURGERY Left    BILATERAL CARPAL TUNNEL RELEASE     BREAST BIOPSY Left 2019   benign   ELBOW SURGERY     GASTRIC BYPASS  2014   HAND NEUROPLASTY     radio denervation  05/21/2020   TONSILLECTOMY     Patient Active Problem List   Diagnosis Date Noted   H/O bariatric surgery 06/29/2021   Bilateral tennis elbow 06/15/2021   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   Type 1 diabetes mellitus with diabetic polyneuropathy ([place]) 12/25/2020   Diabetic neuropathy ([place]) 12/09/2020   Myofascial pain 09/12/2020   Wheelchair dependence 09/12/2020   Dyslipidemia 06/19/2020   Spondylosis without myelopathy or radiculopathy, lumbar region 04/27/2020   Sensorineural hearing  loss (SNHL) of both ears 08/21/2019   Tinnitus, bilateral 06/27/2019   FH: ovarian cancer in first degree relative 11/16/2018   Hypercholesteremia 11/15/2018   Gait abnormality 11/07/2018   Pain of left hip joint 05/18/2018   Chronic midline low back pain with bilateral sciatica 05/18/2018   Breast calcifications 12/28/2017   Fibromyalgia 12/06/2017   History of gastric bypass 12/06/2017   Spinal stenosis 12/06/2017   Asthma 12/06/2017   Cerebellar ataxia ([place])    Type I diabetes mellitus with complication, uncontrolled    Serum albumin decreased 11/10/2017   Vitamin B6 deficiency 11/10/2017   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   Hypoglycemia due to type 1 diabetes mellitus ([place]) 06/20/2016   Hyperparathyroidism ([place]) 10/29/2014   Increased PTH level 10/01/2014   Obstructive sleep apnea on CPAP 10/01/2014   Non-organic enuresis 05/28/2014   Urge incontinence of urine 05/28/2014   History of vaginal hysterectomy 05/24/2014   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   Pain of lumbar facet joint 04/11/2013   Mild persistent asthma without complication 93/90/3009   Carpal tunnel syndrome 23/30/0762   Lichen planus 26/33/3545   Adjustment disorder with mixed anxiety and depressed mood 05/11/2012   Degeneration of lumbar intervertebral disc 04/26/2012   Pain in joint involving ankle and foot 04/26/2012  Osteoarthritis of hand 05/24/2011   Leukopenia 09/25/2009    ONSET DATE: 07/06/2021 (MD referral); initial symptoms 2007  REFERRING DIAG: G11.9 (ICD-10-CM) - Cerebellar ataxia ([place]) R26.9 (ICD-10-CM) - Gait abnormality   THERAPY DIAG:  No diagnosis found.  SUBJECTIVE:                                                                                                                                                                                              SUBJECTIVE STATEMENT: Stretching in the AM performing trunk rotations and hanging leg off EOB to stretch.  Tested standing ability standing at pharmacy counter during appointment yesterday  Pt accompanied by: self  PERTINENT HISTORY: Has Cerebellar atrophy with ataxia and also chronic low back pain s/p radiofrequency ablation 3/22; Has had trigger point injections for L tennis elbow. Has has Myofascial pain and fibromyalgia.  lumbar spondylosis, chronic LBP  PMH:  DM, neuropathy, closed foot fracture fall of 2022, HLD, PE, spinal stenosis, tennis elbow  PAIN:  Are you having pain? Yes: NPRS scale: 5/10 Pain location: L dorsal forearm Pain description: sharp, achy Aggravating factors: opening objects, extending fingers Relieving factors: trigger point injections (Seeing OT)  PRECAUTIONS: Fall  -hx of Type 1 DM, carries glucose tablets with her; follows the "Rule of 15"  PATIENT GOALS Pt's goals for therapy are to increase strength and mobility.     OBJECTIVE:  TODAY'S TREATMENT: 08/19/21 Activity Comments  Progress Note items addressed: see below   Static standing at kitchen counter x 5 min BUE-unilat support  TUG test: 53.34 sec W/ RW  Berg Balance Test: 28/56   NU-step level 10 x 2 min, level 8 x 3 min Self-selected pace for heavy work  Gait trials w/ RW and CGA Emphasis on negotiating turns      TODAY'S TREATMENT: 08/17/2021 Activity Comments  Sit<>stand from 19" surface, x 5 reps Improved control with sit<>stand with reps  Gait x 12 ft, with RW  Min guard  TUG score 56.72 sec Slowed pace with turns  TUG shuttle, 10-20 ft distances with RW, 3 reps Min guard and cues for foot placement in BOS of walker, to improve push-off with foot to improve forward momentum with gait  Gait 16 ft x 2 reps, with RW and min guard Cues for improved turning technique  Short distance gait 12 ft mat>counter, with RW Min guard  Standing pre-gait: Stagger stance forward/back rocking, 5 reps Marching in place, x 5 PT holding walker for steadiness  Seated ankle exercises: Bilat heel raises 2 x  10 Alternating heel raises 2 x 10   Standing heel raises x  5 reps at sink Moderate LOB and PT assists to regain balance  NuStep, Level 2, 4 extremities x 5 minutes, steps/min > 50 Initially tried BLEs only, with added UEs helped with smoother motion throughout.   Multiple reps of sit<>stand from mat surface to walker, between activities with min guard assist  Squat pivot transfer w/c<>mat with supervision, and w/c <>NuStep     _________________________________________________________________________  OBJECTIVE: measures were taken at time of initial evaluation unless otherwise specified  MMT:    MMT Right 08/19/2021 Left 08/19/2021  Hip flexion 5/5 5/5  Hip extension    Hip abduction 4/5 4/5  Hip adduction 4/5 4/5  Hip internal rotation    Hip external rotation    Knee flexion 5/5 5/5  Knee extension 5/5 5/5  Ankle dorsiflexion 3+/5 3+/5  Ankle plantarflexion    Ankle inversion    Ankle eversion    (Blank rows = not tested)  TRANSFERS: Assistive device utilized: Environmental consultant - 2 wheeled  Sit to stand: SBA Stand to sit: SBA Pt stands at [employer] x 1:26, prior to needing to sit.  GAIT: Gait pattern: step to pattern, step through pattern, ataxic, and wide BOS Distance walked: 20 ft Assistive device utilized: Walker - 2 wheeled Level of assistance: CGA and Min A Comments: With TUG test.  Pt reports difficulty with ramp/incline negotiation.  FUNCTIONAL TESTs:  Timed up and go (TUG): 53.34 sec BERG BALANCE: 28/56   HOME EXERCISE PROGRAM: MedBridge Access Code: [email or web address] URL: [email or web address] Date: 07/20/2021 Prepared by: [facility]  _______________________________________________________________________________    GOALS: Goals reviewed with patient? Yes  SHORT TERM GOALS: Target date: 08/17/2021  Pt will be independent with HEP for improved strength, standing, transfers, balance. Baseline: Goal status: GOAL  MET  2.  Pt perform standing at counter x 3 minutes with UE support for improved standing tolerance. Baseline: 1 min, 26 sec at eval Goal status: GOAL MET    LONG TERM GOALS: Target date: 09/14/2021   Pt will be independent with HEP for improved strength, balance, transfers, and gait. Baseline: initiated Goal status: IN PROGRESS  2.  Pt will perform at least 5 minutes of standing at counter/RW for improved standing balance and performance with ADLs. Baseline: unable at start of care. 5 min with BUE-unilat support Goal status: MET  3.  Pt will improve TUG score to less than or equal to 35 sec for decreased fall risk. Baseline: 50.34 sec; 53.34 sec Goal status: IN PROGRESS  4.  Berg Balance test to be assessed, with score to improve by at least 5 points for decreased fall risk. Baseline: 19/56 07/22/21 at start of care; 28/56 on 08/19/21 Goal status: REVISED  5.  Pt will verbalize plans for ongoing community fitness, including aquatic fitness options, upon d/c from PT. Baseline:  Goal status: IN PROGRESS   ASSESSMENT:  CLINICAL IMPRESSION: Performed progress note items addressing STG/LTG meeting her static standing goal of 5 minutes and reports minimal physical exertion to do so.  Berg Balance Test reveals marked improvement in control and balance capabilities with score of 28/56 improved from initial 19/56 with goal revised to progress these tasks such as narrow BOS safety, reaching, and trunk/head turns to improve safety with mobility. Continued sessions indicated to progress POC details and improve independence and safety with transfers and gait as well as develop more comprehensive HEP for heavy resistance   OBJECTIVE IMPAIRMENTS Abnormal gait, decreased activity tolerance, decreased balance, decreased endurance, decreased mobility,  difficulty walking, decreased strength, and postural dysfunction.   ACTIVITY LIMITATIONS community activity and community fitness .   PERSONAL  FACTORS  PMH:  Cerebellar atrophy with ataxia, chronic low back pain s/p radiofrequency ablation 3/22; trigger point injections for L tennis elbow; myofascial pain and fibromyalgia.  lumbar spondylosis.  DM, neuropathy, closed foot fracture fall of 2022, HLD, PE, spinal stenosis, are also affecting patient's functional outcome.    REHAB POTENTIAL: Good  CLINICAL DECISION MAKING: Evolving/moderate complexity  EVALUATION COMPLEXITY: Moderate  PLAN: PT FREQUENCY: 2x/week  PT DURATION: 8 weeks  including eval week  PLANNED INTERVENTIONS: Therapeutic exercises, Therapeutic activity, Neuromuscular re-education, Balance training, Gait training, Patient/Family education, Joint mobilization, DME instructions, Aquatic Therapy, and Manual therapy  PLAN FOR NEXT SESSION: .  Continue to work on short distance gait, progression of standing exercises, trunk turns in standing.Continue to progress lower extremity strength and standing balance. Core stability, posture exercises. Standing exercises at counter-try head turns/nods, lateral or A/P weightshifting, reaching tasks.  At some point in POC, plan for transition, maybe 1x/wk to aquatic therapy. *(heard from [facility] about aquatic therapy-they would have appts available around end of June, so consider trying to get scheduled to trial)   8:49 AM, 08/19/21 M. Sherlyn Lees, PT, DPT Physical Therapist- [place] Office Number: [phone]

## 2021-08-19 NOTE — Therapy (Signed)
OUTPATIENT OCCUPATIONAL THERAPY TREATMENT NOTE  Patient Name: Joyce Brown MRN: [id number] DOB:Feb 07, 1951, 71 y.o., female Today's Date: 08/19/2021  PCP: Talbert Cage, MD REFERRING PROVIDER: Marcial Pacas, MD   OT End of Session - 08/19/21 0848     Visit Number 5    Number of Visits 17    Date for OT Re-Evaluation 10/07/21    Authorization Type Medicare    Progress Note Due on Visit 10    OT Start Time 0845    OT Stop Time 0948   OT Time Calculation (min) 63 min   Activity Tolerance Patient tolerated treatment well    Behavior During Therapy [facility] for tasks assessed/performed            Past Medical History:  Diagnosis Date   Cerebellar ataxia ([place])    Diabetes mellitus without complication ([place])    Diabetic neuropathy ([place]) 12/09/2020   Foot fracture 2022   Gait abnormality 11/07/2018   Hyperlipidemia    Pulmonary embolism ([place])    Sleep apnea    Spinal stenosis    Past Surgical History:  Procedure Laterality Date   ABDOMINAL HYSTERECTOMY     ADENOIDECTOMY     ANKLE SURGERY Left    BILATERAL CARPAL TUNNEL RELEASE     BREAST BIOPSY Left 2019   benign   ELBOW SURGERY     GASTRIC BYPASS  2014   HAND NEUROPLASTY     radio denervation  05/21/2020   TONSILLECTOMY     Patient Active Problem List   Diagnosis Date Noted   H/O bariatric surgery 06/29/2021   Bilateral tennis elbow 06/15/2021   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   Type 1 diabetes mellitus with diabetic polyneuropathy ([place]) 12/25/2020   Diabetic neuropathy ([place]) 12/09/2020   Myofascial pain 09/12/2020   Wheelchair dependence 09/12/2020   Dyslipidemia 06/19/2020   Spondylosis without myelopathy or radiculopathy, lumbar region 04/27/2020   Sensorineural hearing loss (SNHL) of both ears 08/21/2019   Tinnitus, bilateral 06/27/2019   FH: ovarian cancer in first degree relative 11/16/2018   Hypercholesteremia 11/15/2018   Gait abnormality 11/07/2018   Pain of left hip joint  05/18/2018   Chronic midline low back pain with bilateral sciatica 05/18/2018   Breast calcifications 12/28/2017   Fibromyalgia 12/06/2017   History of gastric bypass 12/06/2017   Spinal stenosis 12/06/2017   Asthma 12/06/2017   Cerebellar ataxia ([place])    Type I diabetes mellitus with complication, uncontrolled    Serum albumin decreased 11/10/2017   Vitamin B6 deficiency 11/10/2017   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   Hypoglycemia due to type 1 diabetes mellitus ([place]) 06/20/2016   Hyperparathyroidism ([place]) 10/29/2014   Increased PTH level 10/01/2014   Obstructive sleep apnea on CPAP 10/01/2014   Non-organic enuresis 05/28/2014   Urge incontinence of urine 05/28/2014   History of vaginal hysterectomy 05/24/2014   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   Pain of lumbar facet joint 04/11/2013   Mild persistent asthma without complication 45/80/9983   Carpal tunnel syndrome 38/25/0539   Lichen planus 76/73/4193   Adjustment disorder with mixed anxiety and depressed mood 05/11/2012   Degeneration of lumbar intervertebral disc 04/26/2012   Pain in joint involving ankle and foot 04/26/2012   Osteoarthritis of hand 05/24/2011   Leukopenia 09/25/2009    ONSET DATE: 07/22/21 (referral date)  REFERRING DIAG:  R26.89 (ICD-10-CM) - Other abnormalities of gait and mobility R29.818 (ICD-10-CM) - Other symptoms and signs involving the nervous system   THERAPY DIAG:  Lateral  epicondylitis, left elbow  Lateral epicondylitis of right elbow  Ataxia  Other lack of coordination  SUBJECTIVE:   SUBJECTIVE STATEMENT: Pt reported the orthosis felt "comfortable and secure" prior to conclusion of session Pt accompanied by: self  PERTINENT HISTORY: Cerebellar atrophy w/ ataxia and chronic low back pain s/p radiofrequency ablation 3/22; myofascial pain and fibromyalgia; bilateral epicondylitis elbow (symptoms started in April 2023)  PMH includes T1DM w/ diabetic neuropathy,  asthma, OSA on CPAP, and hx of HTN, PE, foot fracture in 2022, medial epicondylectomy (approx '98), bilateral carpal tunnel release  PRECAUTIONS: Fall; carries glucose tablets and follows the "Rule of 15" for T1DM, 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 Yes: NPRS scale: 4/10 Pain location: slightly distal to L elbow Pain description: neuropathic pain Aggravating factors: movement, specific positions (supination, reaching to the left) Relieving factors: pain patches, warm pack  PATIENT GOALS Stretches for tennis elbow; increase dexterity   OBJECTIVE:   ADLs: Overall ADLs: Reports Mod I w/ all BADLs Transfers/ambulation related to ADLs: w/c for in-home mobility; RW for transfers in/out of bathroom Equipment: Transfer tub bench, Grab bars, Reacher, and elevated toilet seat  IADLs: Shopping: w/c mobility to and from grocery; lives very close to a [employer] housekeeping: Able to complete light tasks; assist for heavier housekeeping Meal Prep: Microwave meal prep, simple snack prep Community mobility: Uses [employer] [place] paratransit services; has not driven in > 10 years Medication management: Independent Financial management: Independent Handwriting:  Unable to be assessed at evaluation; to be further assessed prn   TODAY'S TREATMENT - 08/19/21: Fabricated and fitted volar-based wrist immobilization orthosis using 1/8" perforated material. Pt positioned w/ wrist in about 15 degrees of extension w/ pt able to flex MPJs and oppose thumb to each finger. Distal palmar border and border of thenar eminence trimmed and folded to prevent irritation. All edges smoothed and rounded w/ stockinette administered to pt. Pt instructed to wear orthosis during repetitive activities and at night prn. No signs of skin irritation reported or observed prior to conclusion of session.   PATIENT EDUCATION: Education provided on purpose of orthosis, wear and care, as well as potential  signs and symptoms of irritation or inadequate fit to be aware of Person educated: Patient Education method: Customer service manager Education comprehension: verbalized understanding and returned demonstration   HOME EXERCISE PROGRAM: To be administered   GOALS: Goals reviewed with patient? Yes  SHORT TERM GOALS: Target date: 08/28/2021  STG  Status:  1 Pt will verbalize understanding of at least 2 precautions and positional considerations related to worsening of lateral epicondylitis symptoms Baseline: Decreased knowledge of precautions Achieved - 08/05/21  2 Pt will demonstrate understanding of initial HEP designed for increased BUE GMC, ROM, and stretching Baseline: No HEP at this time In progress  3 Pt will demonstrate understanding of at least 1 visual compensatory strategy to reduce functional impact of visual perceptual limitations Baseline: Reports diplopia; no currently compensatory strategies In progress    LONG TERM GOALS: Target date: 09/25/2021  LTG  Status:  1 Pt will demonstrate understanding of initial HEP designed for increased BUE GMC, ROM, and stretching Baseline: No HEP at this time In progress  2 Pt will improve L hand grip strength by at least 9 lbs w/ pain less than 2/10 by discharge Baseline: 35 lbs (RUE 58 lbs) In progress   3 Pt will increase Box and Blocks score bilaterally by at least 3 blocks to demonstrate improved GMC Baseline:  39 w/ RUE; 26 w/ LUE In progress  4 Pt will be able to grip and lift a lightweight object (e.g., coffee mug) w/out pain in L elbow Baseline: Pain w/ grasp and lifting In progress  5 Pt will decrease time to complete 9-HPT by at least 4 seconds to indicate improved control and coordination for functional bilateral FM tasks Baseline: 54.9 sec (RUE 38.5 sec) In progress     ASSESSMENT:  CLINICAL IMPRESSION: Pt arrived to OT session today for fabrication of custom wrist immobilization orthosis to be worn in combination w/  counterforce "tennis elbow" brace for symptom management and decreased stress on involved structures. Pt instructed to wear administered orthosis in combination w/ counterforce brace during the day w/ pt verbalizing understanding. Check of orthosis fit to be completed next session; confirm no irritation and appropriate fit of MCP bar (confirm full MPJ flexion), thenar border (confirm thumb ROM w/out irritation), and fit of region between thumb and index finger prn.  PERFORMANCE DEFICITS in functional skills including ADLs, IADLs, coordination, dexterity, sensation, tone, ROM, strength, pain, FMC, GMC, mobility, balance, body mechanics, decreased knowledge of precautions, decreased knowledge of use of DME, vision, and UE functional use.   IMPAIRMENTS are limiting patient from ADLs, IADLs, rest and sleep, leisure, and social participation.   COMORBIDITIES has co-morbidities such as DM w/ neuropathy and retinopathy, chronic low back pain, asthma, and myofascial pain  that affects occupational performance. Patient will benefit from skilled OT to address above impairments and improve overall function.   PLAN: OT FREQUENCY: 2x/week  OT DURATION: 8 weeks  PLANNED INTERVENTIONS: self care/ADL training, therapeutic exercise, therapeutic activity, neuromuscular re-education, manual therapy, passive range of motion, functional mobility training, aquatic therapy, splinting, ultrasound, iontophoresis, compression bandaging, moist heat, cryotherapy, patient/family education, visual/perceptual remediation/compensation, and DME and/or AE instructions  RECOMMENDED OTHER SERVICES: Currently receiving PT services at this location  CONSULTED AND AGREED WITH PLAN OF CARE: Patient  PLAN FOR NEXT SESSION: Orthosis check w/ adjustments if needed; continue to address [place] / [place], MSOT, OTR/L 08/19/2021, 11:21 AM

## 2021-08-20 ENCOUNTER — Ambulatory Visit: Payer: Medicare Other | Admitting: Physical Therapy

## 2021-08-20 ENCOUNTER — Encounter: Payer: Medicare Other | Admitting: Occupational Therapy

## 2021-08-21 NOTE — Therapy (Signed)
OUTPATIENT PHYSICAL THERAPY NEURO TREATMENT   Patient Name: Joyce Brown MRN: [id number] DOB:[birth year], 71 y.o., female Today's Date: 08/21/2021  PCP: Talbert Cage, MD REFERRING PROVIDER: Marcial Pacas, MD     END OF SESSION:         Past Medical History:  Diagnosis Date   Cerebellar ataxia ([place])    Diabetes mellitus without complication ([place])    Diabetic neuropathy ([place]) 12/09/2020   Foot fracture 2022   Gait abnormality 11/07/2018   Hyperlipidemia    Pulmonary embolism ([place])    Sleep apnea    Spinal stenosis    Past Surgical History:  Procedure Laterality Date   ABDOMINAL HYSTERECTOMY     ADENOIDECTOMY     ANKLE SURGERY Left    BILATERAL CARPAL TUNNEL RELEASE     BREAST BIOPSY Left 2019   benign   ELBOW SURGERY     GASTRIC BYPASS  2014   HAND NEUROPLASTY     radio denervation  05/21/2020   TONSILLECTOMY     Patient Active Problem List   Diagnosis Date Noted   H/O bariatric surgery 06/29/2021   Bilateral tennis elbow 06/15/2021   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   Type 1 diabetes mellitus with diabetic polyneuropathy ([place]) 12/25/2020   Diabetic neuropathy ([place]) 12/09/2020   Myofascial pain 09/12/2020   Wheelchair dependence 09/12/2020   Dyslipidemia 06/19/2020   Spondylosis without myelopathy or radiculopathy, lumbar region 04/27/2020   Sensorineural hearing loss (SNHL) of both ears 08/21/2019   Tinnitus, bilateral 06/27/2019   FH: ovarian cancer in first degree relative 11/16/2018   Hypercholesteremia 11/15/2018   Gait abnormality 11/07/2018   Pain of left hip joint 05/18/2018   Chronic midline low back pain with bilateral sciatica 05/18/2018   Breast calcifications 12/28/2017   Fibromyalgia 12/06/2017   History of gastric bypass 12/06/2017   Spinal stenosis 12/06/2017   Asthma 12/06/2017   Cerebellar ataxia ([place])    Type I diabetes mellitus with complication, uncontrolled    Serum albumin decreased 11/10/2017   Vitamin B6  deficiency 11/10/2017   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   Hypoglycemia due to type 1 diabetes mellitus ([place]) 06/20/2016   Hyperparathyroidism ([place]) 10/29/2014   Increased PTH level 10/01/2014   Obstructive sleep apnea on CPAP 10/01/2014   Non-organic enuresis 05/28/2014   Urge incontinence of urine 05/28/2014   History of vaginal hysterectomy 05/24/2014   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   Pain of lumbar facet joint 04/11/2013   Mild persistent asthma without complication 59/56/3875   Carpal tunnel syndrome 64/33/2951   Lichen planus 88/41/6606   Adjustment disorder with mixed anxiety and depressed mood 05/11/2012   Degeneration of lumbar intervertebral disc 04/26/2012   Pain in joint involving ankle and foot 04/26/2012   Osteoarthritis of hand 05/24/2011   Leukopenia 09/25/2009    ONSET DATE: 07/06/2021 (MD referral); initial symptoms 2007  REFERRING DIAG: G11.9 (ICD-10-CM) - Cerebellar ataxia ([place]) R26.9 (ICD-10-CM) - Gait abnormality   THERAPY DIAG:  No diagnosis found.  SUBJECTIVE:  SUBJECTIVE STATEMENT: Stretching in the AM performing trunk rotations and hanging leg off EOB to stretch. Tested standing ability standing at pharmacy counter during appointment yesterday  Pt accompanied by: self  PERTINENT HISTORY: Has Cerebellar atrophy with ataxia and also chronic low back pain s/p radiofrequency ablation 3/22; Has had trigger point injections for L tennis elbow. Has has Myofascial pain and fibromyalgia.  lumbar spondylosis, chronic LBP  PMH:  DM, neuropathy, closed foot fracture fall of 2022, HLD, PE, spinal stenosis, tennis elbow  PAIN:  Are you having pain? Yes: NPRS scale: 5/10 Pain location: L dorsal forearm Pain description: sharp, achy Aggravating  factors: opening objects, extending fingers Relieving factors: trigger point injections (Seeing OT)  PRECAUTIONS: Fall  -hx of Type 1 DM, carries glucose tablets with her; follows the "Rule of 15"  PATIENT GOALS Pt's goals for therapy are to increase strength and mobility.     OBJECTIVE:   TODAY'S TREATMENT: 08/24/21 Activity Comments                       TREATMENT: 08/19/21 Activity Comments  Progress Note items addressed: see below   Static standing at kitchen counter x 5 min BUE-unilat support  TUG test: 53.34 sec W/ RW  Berg Balance Test: 28/56   NU-step level 10 x 2 min, level 8 x 3 min Self-selected pace for heavy work  Gait trials w/ RW and CGA Emphasis on negotiating turns      _________________________________________________________________________  OBJECTIVE: measures were taken at time of initial evaluation unless otherwise specified  MMT:    MMT Right 08/21/2021 Left 08/21/2021  Hip flexion 5/5 5/5  Hip extension    Hip abduction 4/5 4/5  Hip adduction 4/5 4/5  Hip internal rotation    Hip external rotation    Knee flexion 5/5 5/5  Knee extension 5/5 5/5  Ankle dorsiflexion 3+/5 3+/5  Ankle plantarflexion    Ankle inversion    Ankle eversion    (Blank rows = not tested)  TRANSFERS: Assistive device utilized: Environmental consultant - 2 wheeled  Sit to stand: SBA Stand to sit: SBA Pt stands at [employer] x 1:26, prior to needing to sit.  GAIT: Gait pattern: step to pattern, step through pattern, ataxic, and wide BOS Distance walked: 20 ft Assistive device utilized: Walker - 2 wheeled Level of assistance: CGA and Min A Comments: With TUG test.  Pt reports difficulty with ramp/incline negotiation.  FUNCTIONAL TESTs:  Timed up and go (TUG): 53.34 sec BERG BALANCE: 28/56   HOME EXERCISE PROGRAM: MedBridge Access Code: [email or web address] URL: [email or web address] Date: 07/20/2021 Prepared by: [facility]  _______________________________________________________________________________    GOALS: Goals reviewed with patient? Yes  SHORT TERM GOALS: Target date: 08/17/2021  Pt will be independent with HEP for improved strength, standing, transfers, balance. Baseline: Goal status: GOAL MET  2.  Pt perform standing at counter x 3 minutes with UE support for improved standing tolerance. Baseline: 1 min, 26 sec at eval Goal status: GOAL MET    LONG TERM GOALS: Target date: 09/14/2021   Pt will be independent with HEP for improved strength, balance, transfers, and gait. Baseline: initiated Goal status: IN PROGRESS  2.  Pt will perform at least 5 minutes of standing at counter/RW for improved standing balance and performance with ADLs. Baseline: unable at start of care. 5 min with BUE-unilat support Goal status: MET  3.  Pt will improve TUG score to less than  or equal to 35 sec for decreased fall risk. Baseline: 50.34 sec; 53.34 sec Goal status: IN PROGRESS  4.  Berg Balance test to be assessed, with score to improve by at least 5 points for decreased fall risk. Baseline: 19/56 07/22/21 at start of care; 28/56 on 08/19/21 Goal status: REVISED  5.  Pt will verbalize plans for ongoing community fitness, including aquatic fitness options, upon d/c from PT. Baseline:  Goal status: IN PROGRESS   ASSESSMENT:  CLINICAL IMPRESSION: Performed progress note items addressing STG/LTG meeting her static standing goal of 5 minutes and reports minimal physical exertion to do so.  Berg Balance Test reveals marked improvement in control and balance capabilities with score of 28/56 improved from initial 19/56 with goal revised to progress these tasks such as narrow BOS safety, reaching, and trunk/head turns to improve safety with mobility. Continued sessions indicated to progress POC details and improve independence and safety with transfers and gait as well as develop more comprehensive HEP for  heavy resistance   OBJECTIVE IMPAIRMENTS Abnormal gait, decreased activity tolerance, decreased balance, decreased endurance, decreased mobility, difficulty walking, decreased strength, and postural dysfunction.   ACTIVITY LIMITATIONS community activity and community fitness .   PERSONAL FACTORS  PMH:  Cerebellar atrophy with ataxia, chronic low back pain s/p radiofrequency ablation 3/22; trigger point injections for L tennis elbow; myofascial pain and fibromyalgia.  lumbar spondylosis.  DM, neuropathy, closed foot fracture fall of 2022, HLD, PE, spinal stenosis, are also affecting patient's functional outcome.    REHAB POTENTIAL: Good  CLINICAL DECISION MAKING: Evolving/moderate complexity  EVALUATION COMPLEXITY: Moderate  PLAN: PT FREQUENCY: 2x/week  PT DURATION: 8 weeks  including eval week  PLANNED INTERVENTIONS: Therapeutic exercises, Therapeutic activity, Neuromuscular re-education, Balance training, Gait training, Patient/Family education, Joint mobilization, DME instructions, Aquatic Therapy, and Manual therapy  PLAN FOR NEXT SESSION: .  Continue to work on short distance gait, progression of standing exercises, trunk turns in standing.Continue to progress lower extremity strength and standing balance. Core stability, posture exercises. Standing exercises at counter-try head turns/nods, lateral or A/P weightshifting, reaching tasks.  At some point in POC, plan for transition, maybe 1x/wk to aquatic therapy. *(heard from [facility] about aquatic therapy-they would have appts available around end of June, so consider trying to get scheduled to trial)   9:25 AM, 08/21/21 M. Sherlyn Lees, PT, DPT Physical Therapist- [place] Office Number: [phone]

## 2021-08-24 ENCOUNTER — Ambulatory Visit: Payer: Medicare Other | Admitting: Physical Therapy

## 2021-08-24 ENCOUNTER — Encounter: Payer: Self-pay | Admitting: Physical Therapy

## 2021-08-24 ENCOUNTER — Ambulatory Visit: Payer: Medicare Other | Admitting: Occupational Therapy

## 2021-08-24 DIAGNOSIS — R278 Other lack of coordination: Secondary | ICD-10-CM | POA: Diagnosis not present

## 2021-08-24 DIAGNOSIS — M6281 Muscle weakness (generalized): Secondary | ICD-10-CM

## 2021-08-24 DIAGNOSIS — R2689 Other abnormalities of gait and mobility: Secondary | ICD-10-CM | POA: Diagnosis not present

## 2021-08-24 DIAGNOSIS — M7712 Lateral epicondylitis, left elbow: Secondary | ICD-10-CM

## 2021-08-24 DIAGNOSIS — R2681 Unsteadiness on feet: Secondary | ICD-10-CM | POA: Diagnosis not present

## 2021-08-24 DIAGNOSIS — R270 Ataxia, unspecified: Secondary | ICD-10-CM

## 2021-08-24 NOTE — Therapy (Signed)
OUTPATIENT OCCUPATIONAL THERAPY TREATMENT NOTE  Patient Name: Joyce Brown MRN: [id number] DOB:Mar 06, 1951, 71 y.o., female Today's Date: 08/24/2021  PCP: Talbert Cage, MD REFERRING PROVIDER: Marcial Pacas, MD  End of Session:     OT End of Session - 08/24/21 0926     Visit Number 6    Number of Visits 17    Date for OT Re-Evaluation 10/07/21    Authorization Type Medicare    Progress Note Due on Visit 10    OT Start Time 0930    OT Stop Time 1016    OT Time Calculation (min) 46 min    Activity Tolerance Patient tolerated treatment well    Behavior During Therapy [facility] for tasks assessed/performed             Past Medical History:  Diagnosis Date   Cerebellar ataxia ([place])    Diabetes mellitus without complication ([place])    Diabetic neuropathy ([place]) 12/09/2020   Foot fracture 2022   Gait abnormality 11/07/2018   Hyperlipidemia    Pulmonary embolism ([place])    Sleep apnea    Spinal stenosis    Past Surgical History:  Procedure Laterality Date   ABDOMINAL HYSTERECTOMY     ADENOIDECTOMY     ANKLE SURGERY Left    BILATERAL CARPAL TUNNEL RELEASE     BREAST BIOPSY Left 2019   benign   ELBOW SURGERY     GASTRIC BYPASS  2014   HAND NEUROPLASTY     radio denervation  05/21/2020   TONSILLECTOMY     Patient Active Problem List   Diagnosis Date Noted   H/O bariatric surgery 06/29/2021   Bilateral tennis elbow 06/15/2021   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   Type 1 diabetes mellitus with diabetic polyneuropathy ([place]) 12/25/2020   Diabetic neuropathy ([place]) 12/09/2020   Myofascial pain 09/12/2020   Wheelchair dependence 09/12/2020   Dyslipidemia 06/19/2020   Spondylosis without myelopathy or radiculopathy, lumbar region 04/27/2020   Sensorineural hearing loss (SNHL) of both ears 08/21/2019   Tinnitus, bilateral 06/27/2019   FH: ovarian cancer in first degree relative 11/16/2018   Hypercholesteremia 11/15/2018   Gait abnormality 11/07/2018   Pain  of left hip joint 05/18/2018   Chronic midline low back pain with bilateral sciatica 05/18/2018   Breast calcifications 12/28/2017   Fibromyalgia 12/06/2017   History of gastric bypass 12/06/2017   Spinal stenosis 12/06/2017   Asthma 12/06/2017   Cerebellar ataxia ([place])    Type I diabetes mellitus with complication, uncontrolled    Serum albumin decreased 11/10/2017   Vitamin B6 deficiency 11/10/2017   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   Hypoglycemia due to type 1 diabetes mellitus ([place]) 06/20/2016   Hyperparathyroidism ([place]) 10/29/2014   Increased PTH level 10/01/2014   Obstructive sleep apnea on CPAP 10/01/2014   Non-organic enuresis 05/28/2014   Urge incontinence of urine 05/28/2014   History of vaginal hysterectomy 05/24/2014   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   Pain of lumbar facet joint 04/11/2013   Mild persistent asthma without complication 32/20/2542   Carpal tunnel syndrome 70/62/3762   Lichen planus 83/15/1761   Adjustment disorder with mixed anxiety and depressed mood 05/11/2012   Degeneration of lumbar intervertebral disc 04/26/2012   Pain in joint involving ankle and foot 04/26/2012   Osteoarthritis of hand 05/24/2011   Leukopenia 09/25/2009    ONSET DATE: 07/22/21 (referral date)  REFERRING DIAG:  R26.89 (ICD-10-CM) - Other abnormalities of gait and mobility R29.818 (ICD-10-CM) - Other symptoms and signs involving  the nervous system   THERAPY DIAG:  Muscle weakness (generalized)  Ataxia  Lateral epicondylitis, left elbow  Other lack of coordination  SUBJECTIVE:   SUBJECTIVE STATEMENT: Pt reports that she is wearing a glove over her splint when she is propelling w/c.   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 Yes: NPRS scale: 4/10 Pain location: slightly distal to L elbow Pain description: neuropathic pain Aggravating factors: movement, specific positions (supination, reaching to the left) Relieving factors: pain patches, warm pack  PATIENT GOALS Stretches for tennis elbow; increase dexterity   OBJECTIVE:   ADLs: Overall ADLs: Reports Mod I w/ all BADLs Transfers/ambulation related to ADLs: w/c for in-home mobility; RW for transfers in/out of bathroom Equipment: Transfer tub bench, Grab bars, Reacher, and elevated toilet seat  IADLs: Shopping: w/c mobility to and from grocery; lives very close to a [facility] housekeeping: Able to complete light tasks; assist for heavier housekeeping Meal Prep: Microwave meal prep, simple snack prep Community mobility: Uses [employer] [place] paratransit services; has not driven in > 10 years Medication management: Independent Financial management: Independent Handwriting:  Unable to be assessed at evaluation; to be further assessed prn   TODAY'S TREATMENT - 08/24/21: Assessed fit of splint.  Noted mild redness at ulnar aspect at wrist.  Therapist utilized heat gun to mold splint to smooth out troublesome area and allow for decreased irritation and rubbing.  Pt reports improved fit. Engaged in large peg board pattern replication with use of key to identify correlating colors, challenging alternating attention, visual perception, and L and L hand coordination and dexterity. Pt able to complete activity with initial visual perception errors of skipping rows, however able to identify errors without any cues from therapist.  Educated on functional carryover of task in regards to  following a recipe or instructions.   Engaged in in-hand manipulation and translation with glass stones.  Pt able to pick up stones one at a time and place in palm with R hand but with increased dropping with L hand.  Pt demonstrating difficulty with translation to finger tips, dropping 1/5 with both R and L hands. Increased challenge to completing in-hand manipulation with beans to simulate dispensing medication.  Pt demonstrating increased difficulty due to smaller size item.  Pt able to manipulate beans with increased time and use of tip to tip pinch to place in correct slots.       PATIENT EDUCATION: Reiterated education on purpose of orthosis, wear and care, as well as potential signs and symptoms of irritation or inadequate fit to be aware of Person educated: Patient Education method: Customer service manager Education comprehension: verbalized understanding and returned demonstration   HOME EXERCISE PROGRAM: To be administered   GOALS: Goals reviewed with patient? Yes  SHORT TERM GOALS: Target date: 08/28/2021  STG  Status:  1 Pt will verbalize understanding of at least 2 precautions and positional considerations related to worsening of lateral epicondylitis symptoms Baseline: Decreased knowledge of precautions Achieved - 08/05/21  2 Pt will demonstrate understanding of initial HEP  designed for increased BUE GMC, ROM, and stretching Baseline: No HEP at this time In progress  3 Pt will demonstrate understanding of at least 1 visual compensatory strategy to reduce functional impact of visual perceptual limitations Baseline: Reports diplopia; no currently compensatory strategies In progress    LONG TERM GOALS: Target date: 09/25/2021  LTG  Status:  1 Pt will demonstrate understanding of initial HEP designed for increased BUE GMC, ROM, and stretching Baseline: No HEP at this time In progress  2 Pt will improve L hand grip strength by at least 9 lbs w/ pain less than 2/10 by  discharge Baseline: 35 lbs (RUE 58 lbs) In progress   3 Pt will increase Box and Blocks score bilaterally by at least 3 blocks to demonstrate improved GMC Baseline: 34 w/ RUE; 26 w/ LUE In progress  4 Pt will be able to grip and lift a lightweight object (e.g., coffee mug) w/out pain in L elbow Baseline: Pain w/ grasp and lifting In progress  5 Pt will decrease time to complete 9-HPT by at least 4 seconds to indicate improved control and coordination for functional bilateral FM tasks Baseline: 54.9 sec (RUE 38.5 sec) In progress     ASSESSMENT:  CLINICAL IMPRESSION: Session with focus on assessing and smoothing out pressure point at ulnar aspect of wrist.  Pt reports engaging in visual perception tasks at home such as hidden pictures and letter find.  Pt reports noting increased difficulty with busier pictures.  Pt demonstrating improved error awareness during visual scanning task this session, therapist educating on  functional carryover of task.  Pt continues to demonstrate min-mod difficulty with [employer] tasks especially on L side.  PERFORMANCE DEFICITS in functional skills including ADLs, IADLs, coordination, dexterity, sensation, tone, ROM, strength, pain, FMC, GMC, mobility, balance, body mechanics, decreased knowledge of precautions, decreased knowledge of use of DME, vision, and UE functional use.   IMPAIRMENTS are limiting patient from ADLs, IADLs, rest and sleep, leisure, and social participation.   COMORBIDITIES has co-morbidities such as DM w/ neuropathy and retinopathy, chronic low back pain, asthma, and myofascial pain  that affects occupational performance. Patient will benefit from skilled OT to address above impairments and improve overall function.   PLAN: OT FREQUENCY: 2x/week  OT DURATION: 8 weeks  PLANNED INTERVENTIONS: self care/ADL training, therapeutic exercise, therapeutic activity, neuromuscular re-education, manual therapy, passive range of motion, functional  mobility training, aquatic therapy, splinting, ultrasound, iontophoresis, compression bandaging, moist heat, cryotherapy, patient/family education, visual/perceptual remediation/compensation, and DME and/or AE instructions  RECOMMENDED OTHER SERVICES: Currently receiving PT services at this location  CONSULTED AND AGREED WITH PLAN OF CARE: Patient  PLAN FOR NEXT SESSION: Orthosis check w/ adjustments if needed; continue to address [facility] [place], OTR/L 08/24/2021, 1:12 PM

## 2021-08-25 NOTE — Therapy (Signed)
OUTPATIENT PHYSICAL THERAPY NEURO TREATMENT   Patient Name: Joyce Brown MRN: [id number] DOB:04-19-1950, 71 y.o., female Today's Date: 08/26/2021  PCP: Talbert Cage, MD REFERRING PROVIDER: Marcial Pacas, MD     END OF SESSION:   PT End of Session - 08/26/21 0928     Visit Number 12    Number of Visits 18    Date for PT Re-Evaluation 09/14/21    Authorization Type Medicare/[employer]    Progress Note Due on Visit 9    PT Start Time 0844    PT Stop Time 0926    PT Time Calculation (min) 42 min    Equipment Utilized During Treatment Gait belt    Activity Tolerance Patient tolerated treatment well    Behavior During Therapy WFL for tasks assessed/performed                   Past Medical History:  Diagnosis Date   Cerebellar ataxia ([place])    Diabetes mellitus without complication ([place])    Diabetic neuropathy ([place]) 12/09/2020   Foot fracture 2022   Gait abnormality 11/07/2018   Hyperlipidemia    Pulmonary embolism ([place])    Sleep apnea    Spinal stenosis    Past Surgical History:  Procedure Laterality Date   ABDOMINAL HYSTERECTOMY     ADENOIDECTOMY     ANKLE SURGERY Left    BILATERAL CARPAL TUNNEL RELEASE     BREAST BIOPSY Left 2019   benign   ELBOW SURGERY     GASTRIC BYPASS  2014   HAND NEUROPLASTY     radio denervation  05/21/2020   TONSILLECTOMY     Patient Active Problem List   Diagnosis Date Noted   H/O bariatric surgery 06/29/2021   Bilateral tennis elbow 06/15/2021   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   Type 1 diabetes mellitus with diabetic polyneuropathy ([place]) 12/25/2020   Diabetic neuropathy ([place]) 12/09/2020   Myofascial pain 09/12/2020   Wheelchair dependence 09/12/2020   Dyslipidemia 06/19/2020   Spondylosis without myelopathy or radiculopathy, lumbar region 04/27/2020   Sensorineural hearing loss (SNHL) of both ears 08/21/2019   Tinnitus, bilateral 06/27/2019   FH: ovarian cancer in first degree relative 11/16/2018    Hypercholesteremia 11/15/2018   Gait abnormality 11/07/2018   Pain of left hip joint 05/18/2018   Chronic midline low back pain with bilateral sciatica 05/18/2018   Breast calcifications 12/28/2017   Fibromyalgia 12/06/2017   History of gastric bypass 12/06/2017   Spinal stenosis 12/06/2017   Asthma 12/06/2017   Cerebellar ataxia ([place])    Type I diabetes mellitus with complication, uncontrolled    Serum albumin decreased 11/10/2017   Vitamin B6 deficiency 11/10/2017   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   Hypoglycemia due to type 1 diabetes mellitus ([place]) 06/20/2016   Hyperparathyroidism ([place]) 10/29/2014   Increased PTH level 10/01/2014   Obstructive sleep apnea on CPAP 10/01/2014   Non-organic enuresis 05/28/2014   Urge incontinence of urine 05/28/2014   History of vaginal hysterectomy 05/24/2014   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   Pain of lumbar facet joint 04/11/2013   Mild persistent asthma without complication 54/65/6812   Carpal tunnel syndrome 75/17/0017   Lichen planus 49/44/9675   Adjustment disorder with mixed anxiety and depressed mood 05/11/2012   Degeneration of lumbar intervertebral disc 04/26/2012   Pain in joint involving ankle and foot 04/26/2012   Osteoarthritis of hand 05/24/2011   Leukopenia 09/25/2009    ONSET DATE: 07/06/2021 (MD referral); initial symptoms 2007  REFERRING DIAG: G11.9 (ICD-10-CM) - Cerebellar ataxia ([place]) R26.9 (ICD-10-CM) - Gait abnormality   THERAPY DIAG:  Muscle weakness (generalized)  Ataxia  Unsteadiness on feet  Other abnormalities of gait and mobility  SUBJECTIVE:                                                                                                                                                                                              SUBJECTIVE STATEMENT: Tried her new HEP but will have to find a different place to do it d/t limited space.   Pt accompanied by: self  PERTINENT  HISTORY: Has Cerebellar atrophy with ataxia and also chronic low back pain s/p radiofrequency ablation 3/22; Has had trigger point injections for L tennis elbow. Has has Myofascial pain and fibromyalgia.  lumbar spondylosis, chronic LBP  PMH:  DM, neuropathy, closed foot fracture fall of 2022, HLD, PE, spinal stenosis, tennis elbow  PAIN:  Are you having pain? Yes: NPRS scale: 5/10 Pain location: L dorsal forearm Pain description: sharp, achy Aggravating factors: opening objects, extending fingers Relieving factors: trigger point injections (Seeing OT)  PRECAUTIONS: Fall  -hx of Type 1 DM, carries glucose tablets with her; follows the "Rule of 15"  PATIENT GOALS Pt's goals for therapy are to increase strength and mobility.     OBJECTIVE:    TODAY'S TREATMENT: 08/26/21 Activity Comments  Squat pivot w/c<>mat 4x Cueing for scooting to edge of seat, hand placement, not standing fully; CGA-min A  STS from mat with R UE support and min A 2x5 Cueing to scoot to edge of seat, increase anterior trunk lean, push hips anteriorly upon standing; min A once standing  Gait with RW 3x50 ft Forward flexed trunk with excessive weight over heels and limited B knee flexion during swing through; c/o mild dizziness with turns; cueing required to lean weight anteriorly and push walker further forward to avoid posterior LOB  In II bars: standing alt UE overhead reach with CGA x20, trunk rotation 10x  CGA-min A for safety; limited by fatigue     HEP last updated 08/24/21: Access Code: [email or web address] URL: [email or web address] Date: 08/24/2021 Prepared by: [facility]  Exercises - Seated Gluteal Sets  - 1 x daily - 7 x weekly - 2 sets - 10 reps - 3 sec hold - Seated Active Hip Flexion  - 1 x daily - 7 x weekly - 2 sets - 10 reps - 5 sec hold - Seated Shoulder Row with Anchored Resistance  - 1 x daily - 5 x weekly - 2 sets - 10 reps - Seated  Shoulder  Extension and Scapular Retraction with Resistance  - 1 x daily - 5 x weekly - 2 sets - 10 reps - Modified Thomas Stretch  - 1 x daily - 5 x weekly - 2 sets - 30 sec hold - Sit to Stand with Counter Support  - 1 x daily - 5 x weekly - 2 sets - 5 reps - Lower Trunk Rotations  - 1-2 x daily - 7 x weekly - 1 sets - 5-10 reps - Hooklying Isometric Clamshell  - 1 x daily - 5 x weekly - 1-2 sets - 10 reps - Seated Heel Raise  - 1 x daily - 5 x weekly - 1-2 sets - 10 reps - Standing Hip Abduction with Counter Support  - 1 x daily - 5 x weekly - 2 sets - 10 reps   ________________________________________________________________________  OBJECTIVE: measures were taken at time of initial evaluation unless otherwise specified  MMT:    MMT Right  Left   Hip flexion 5/5 5/5  Hip extension    Hip abduction 4/5 4/5  Hip adduction 4/5 4/5  Hip internal rotation    Hip external rotation    Knee flexion 5/5 5/5  Knee extension 5/5 5/5  Ankle dorsiflexion 3+/5 3+/5  Ankle plantarflexion    Ankle inversion    Ankle eversion    (Blank rows = not tested)  TRANSFERS: Assistive device utilized: Environmental consultant - 2 wheeled  Sit to stand: SBA Stand to sit: SBA Pt stands at [employer] x 1:26, prior to needing to sit.  GAIT: Gait pattern: step to pattern, step through pattern, ataxic, and wide BOS Distance walked: 20 ft Assistive device utilized: Walker - 2 wheeled Level of assistance: CGA and Min A Comments: With TUG test.  Pt reports difficulty with ramp/incline negotiation.  FUNCTIONAL TESTs:  Timed up and go (TUG): 53.34 sec BERG BALANCE: 28/56   HOME EXERCISE PROGRAM: MedBridge Access Code: [email or web address] URL: [email or web address] Date: 07/20/2021 Prepared by: [facility]  _______________________________________________________________________________    GOALS: Goals reviewed with patient? Yes  SHORT TERM GOALS: Target date: 08/17/2021  Pt will be  independent with HEP for improved strength, standing, transfers, balance. Baseline: Goal status: GOAL MET  2.  Pt perform standing at counter x 3 minutes with UE support for improved standing tolerance. Baseline: 1 min, 26 sec at eval Goal status: GOAL MET    LONG TERM GOALS: Target date: 09/14/2021   Pt will be independent with HEP for improved strength, balance, transfers, and gait. Baseline: initiated Goal status: IN PROGRESS  2.  Pt will perform at least 5 minutes of standing at counter/RW for improved standing balance and performance with ADLs. Baseline: unable at start of care. 5 min with BUE-unilat support Goal status: MET  3.  Pt will improve TUG score to less than or equal to 35 sec for decreased fall risk. Baseline: 50.34 sec; 53.34 sec Goal status: IN PROGRESS  4.  Berg Balance test to be assessed, with score to improve by at least 5 points for decreased fall risk. Baseline: 19/56 07/22/21 at start of care; 28/56 on 08/19/21 Goal status: REVISED  5.  Pt will verbalize plans for ongoing community fitness, including aquatic fitness options, upon d/c from PT. Baseline:  Goal status: IN PROGRESS   ASSESSMENT:  CLINICAL IMPRESSION: Patient arrived to session without new complaints. Worked on squat pivot transfers with cueing for set up and max safety. Believe squat rather than stand  pivot will be the safer option for transfers for patient. Gait training with walker revealed forward flexed trunk with excessive weight over heels and limited B knee flexion during swing through. Patient with tendency for excessive posterior weight shift, requiring cueing to continue pushing walker forward and drive weight anteriorly. Patient with some fatigue at end of session, limiting standing tolerance. No further complaints at end of session.    OBJECTIVE IMPAIRMENTS Abnormal gait, decreased activity tolerance, decreased balance, decreased endurance, decreased mobility, difficulty walking,  decreased strength, and postural dysfunction.   ACTIVITY LIMITATIONS community activity and community fitness .   PERSONAL FACTORS  PMH:  Cerebellar atrophy with ataxia, chronic low back pain s/p radiofrequency ablation 3/22; trigger point injections for L tennis elbow; myofascial pain and fibromyalgia.  lumbar spondylosis.  DM, neuropathy, closed foot fracture fall of 2022, HLD, PE, spinal stenosis, are also affecting patient's functional outcome.    REHAB POTENTIAL: Good  CLINICAL DECISION MAKING: Evolving/moderate complexity  EVALUATION COMPLEXITY: Moderate  PLAN: PT FREQUENCY: 2x/week  PT DURATION: 8 weeks  including eval week  PLANNED INTERVENTIONS: Therapeutic exercises, Therapeutic activity, Neuromuscular re-education, Balance training, Gait training, Patient/Family education, Joint mobilization, DME instructions, Aquatic Therapy, and Manual therapy  PLAN FOR NEXT SESSION: .  Continue to work on short distance gait, progression of standing exercises, trunk turns in standing.Continue to progress lower extremity strength and standing balance. Core stability, posture exercises. Standing exercises at counter-try head turns/nods, lateral or A/P weightshifting, reaching tasks.  At some point in POC, plan for transition, maybe 1x/wk to aquatic therapy. *(heard from [facility] about aquatic therapy-they would have appts available around end of June, so consider trying to get scheduled to trial)   Janene Harvey, PT, DPT 08/26/21 9:29 AM  [facility] at [facility] [address], [place], [place] [address] Phone # [phone] Fax # [phone]

## 2021-08-26 ENCOUNTER — Ambulatory Visit: Payer: Medicare Other | Admitting: Occupational Therapy

## 2021-08-26 ENCOUNTER — Encounter: Payer: Self-pay | Admitting: Physical Therapy

## 2021-08-26 ENCOUNTER — Ambulatory Visit: Payer: Medicare Other | Admitting: Physical Therapy

## 2021-08-26 DIAGNOSIS — M7712 Lateral epicondylitis, left elbow: Secondary | ICD-10-CM

## 2021-08-26 DIAGNOSIS — M6281 Muscle weakness (generalized): Secondary | ICD-10-CM | POA: Diagnosis not present

## 2021-08-26 DIAGNOSIS — R270 Ataxia, unspecified: Secondary | ICD-10-CM

## 2021-08-26 DIAGNOSIS — R278 Other lack of coordination: Secondary | ICD-10-CM | POA: Diagnosis not present

## 2021-08-26 DIAGNOSIS — R2689 Other abnormalities of gait and mobility: Secondary | ICD-10-CM

## 2021-08-26 DIAGNOSIS — R2681 Unsteadiness on feet: Secondary | ICD-10-CM | POA: Diagnosis not present

## 2021-08-26 NOTE — Therapy (Signed)
OUTPATIENT OCCUPATIONAL THERAPY TREATMENT NOTE  Patient Name: Joyce Brown MRN: [id number] DOB:Apr 12, 1950, 71 y.o., female Today's Date: 08/26/2021  PCP: Talbert Cage, MD REFERRING PROVIDER: Marcial Pacas, MD  End of Session:   OT End of Session - 08/26/21 1026     Visit Number 7    Number of Visits 17    Date for OT Re-Evaluation 10/07/21    Authorization Type Medicare    Progress Note Due on Visit 10    OT Start Time 0930    OT Stop Time 4967    OT Time Calculation (min) 45 min    Activity Tolerance Patient tolerated treatment well    Behavior During Therapy WFL for tasks assessed/performed            Past Medical History:  Diagnosis Date   Cerebellar ataxia ([place])    Diabetes mellitus without complication ([place])    Diabetic neuropathy ([place]) 12/09/2020   Foot fracture 2022   Gait abnormality 11/07/2018   Hyperlipidemia    Pulmonary embolism ([place])    Sleep apnea    Spinal stenosis    Past Surgical History:  Procedure Laterality Date   ABDOMINAL HYSTERECTOMY     ADENOIDECTOMY     ANKLE SURGERY Left    BILATERAL CARPAL TUNNEL RELEASE     BREAST BIOPSY Left 2019   benign   ELBOW SURGERY     GASTRIC BYPASS  2014   HAND NEUROPLASTY     radio denervation  05/21/2020   TONSILLECTOMY     Patient Active Problem List   Diagnosis Date Noted   H/O bariatric surgery 06/29/2021   Bilateral tennis elbow 06/15/2021   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   Type 1 diabetes mellitus with diabetic polyneuropathy ([place]) 12/25/2020   Diabetic neuropathy ([place]) 12/09/2020   Myofascial pain 09/12/2020   Wheelchair dependence 09/12/2020   Dyslipidemia 06/19/2020   Spondylosis without myelopathy or radiculopathy, lumbar region 04/27/2020   Sensorineural hearing loss (SNHL) of both ears 08/21/2019   Tinnitus, bilateral 06/27/2019   FH: ovarian cancer in first degree relative 11/16/2018   Hypercholesteremia 11/15/2018   Gait abnormality 11/07/2018   Pain of  left hip joint 05/18/2018   Chronic midline low back pain with bilateral sciatica 05/18/2018   Breast calcifications 12/28/2017   Fibromyalgia 12/06/2017   History of gastric bypass 12/06/2017   Spinal stenosis 12/06/2017   Asthma 12/06/2017   Cerebellar ataxia (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   Pain of lumbar facet joint 04/11/2013   Mild persistent asthma without complication 59/16/3846   Carpal tunnel syndrome 65/99/3570   Lichen planus 17/79/3903   Adjustment disorder with mixed anxiety and depressed mood 05/11/2012   Degeneration of lumbar intervertebral disc 04/26/2012   Pain in joint involving ankle and foot 04/26/2012   Osteoarthritis of hand 05/24/2011   Leukopenia 09/25/2009    ONSET DATE: 07/22/21 (referral date)  REFERRING DIAG:  R26.89 (ICD-10-CM) - Other abnormalities of gait and mobility R29.818 (ICD-10-CM) - Other symptoms and signs involving the nervous system  THERAPY DIAG:  Muscle weakness (generalized)  Ataxia  Lateral epicondylitis, left elbow  Other lack of coordination  SUBJECTIVE:   SUBJECTIVE STATEMENT: Pt reports feeling like the "princess and the pea" with reports of rubbing at forearm edge of splint.  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 Yes: NPRS scale: 4/10 Pain location: slightly distal to L elbow Pain description: neuropathic pain Aggravating factors: movement, specific positions (supination, reaching to the left) Relieving factors: pain patches, warm pack  PATIENT GOALS Stretches for tennis elbow; increase dexterity   OBJECTIVE:   ADLs: Overall ADLs: Reports Mod I w/ all BADLs Transfers/ambulation related to ADLs: w/c for in-home mobility; RW for transfers in/out of bathroom Equipment: Transfer tub bench, Grab bars, Reacher, and elevated toilet seat  IADLs: Shopping: w/c mobility to and from grocery; lives very close to a [employer] housekeeping: Able to complete light tasks; assist for heavier housekeeping Meal Prep: Microwave meal prep, simple snack prep Community mobility: Uses [employer] [place] paratransit services; has not driven in > 10 years Medication management: Independent Financial management: Independent Handwriting:  Unable to be assessed at evaluation; to be further assessed prn   TODAY'S TREATMENT - 08/26/21: Assessed fit of splint.  Noted mild redness at bottom edge along forearm.  Therapist utilized heat gun to mold splint to smooth out troublesome area, flaring edge to decrease friction, and allow for decreased irritation and rubbing.  Pt reports improved fit.  Therapist also provided pt with additional piece of stockinette that is longer to allow for decreased friction between arm and end of splint when propelling w/c. Vision: Pt reports completing visual perceptual handouts out home with focus on finding the items and hidden pictures.  Pt reports improved focus on scanning L > R during table top visual scanning and  attempts to utilize same strategy when in the grocery store.  Pt reports increased difficulty with locating items R of midline. Engaged in visual perceptual activity to locate certain pictures while scanning.  Pt omitting 4, with 3 of the 4 being in the R visual field.  Initiated HEP with focus on movements to address lateral epicondylitis.  Therapist providing demonstration, verbal and tactile cues for improved technique.  Pt able to complete 1 set of 10 each exercise.  Utilized 1# dumbbell for increased strengthening.  Putty Squeezes   Resisted Finger Extension and Thumb Abduction   Seated Wrist Flexion with Dumbbell  Seated Wrist Extension with Dumbbell   Seated Pronation Supination with Dumbbell      PATIENT EDUCATION: Reiterated education on purpose of orthosis, wear and care, as well as potential signs and symptoms of irritation or inadequate fit to be aware of.  Educated on HEP for lateral epicondylitis and encouraged engagement as long as no increase in pain. Person educated: Patient Education method: Customer service manager Education comprehension: verbalized understanding and returned demonstration   HOME EXERCISE PROGRAM: [email or web address]   GOALS: Goals reviewed with patient? Yes  SHORT TERM GOALS: Target date: 08/28/2021  STG  Status:  1 Pt will verbalize understanding of at least 2 precautions  and positional considerations related to worsening of lateral epicondylitis symptoms Baseline: Decreased knowledge of precautions Achieved - 08/05/21  2 Pt will demonstrate understanding of initial HEP designed for increased BUE GMC, ROM, and stretching Baseline: No HEP at this time In progress  3 Pt will demonstrate understanding of at least 1 visual compensatory strategy to reduce functional impact of visual perceptual limitations Baseline: Reports diplopia; no currently compensatory strategies Achieved - 08/26/21    LONG TERM GOALS: Target date: 09/25/2021  LTG  Status:  1 Pt  will demonstrate understanding of initial HEP designed for increased BUE GMC, ROM, and stretching Baseline: No HEP at this time In progress  2 Pt will improve L hand grip strength by at least 9 lbs w/ pain less than 2/10 by discharge Baseline: 35 lbs (RUE 58 lbs) In progress   3 Pt will increase Box and Blocks score bilaterally by at least 3 blocks to demonstrate improved GMC Baseline: 34 w/ RUE; 26 w/ LUE In progress  4 Pt will be able to grip and lift a lightweight object (e.g., coffee mug) w/out pain in L elbow Baseline: Pain w/ grasp and lifting In progress  5 Pt will decrease time to complete 9-HPT by at least 4 seconds to indicate improved control and coordination for functional bilateral FM tasks Baseline: 54.9 sec (RUE 38.5 sec) In progress     ASSESSMENT:  CLINICAL IMPRESSION: Session with focus on assessing and smoothing out pressure point at bottom forearm edge.  Pt reports utilizing L > R lateral scanning both when completing table top tasks as well as when engaging in scanning in grocery store.  Pt demonstrating decreased attention to R of midline when completing table top "find the picture" this session, however with increased time and cues pt able to locate the items she previously omitted.  Pt able to engage in HEP with min cues initially for proper technique and positioning to allow for increased ROM.  PERFORMANCE DEFICITS in functional skills including ADLs, IADLs, coordination, dexterity, sensation, tone, ROM, strength, pain, FMC, GMC, mobility, balance, body mechanics, decreased knowledge of precautions, decreased knowledge of use of DME, vision, and UE functional use.   IMPAIRMENTS are limiting patient from ADLs, IADLs, rest and sleep, leisure, and social participation.   COMORBIDITIES has co-morbidities such as DM w/ neuropathy and retinopathy, chronic low back pain, asthma, and myofascial pain  that affects occupational performance. Patient will benefit from skilled OT to  address above impairments and improve overall function.   PLAN: OT FREQUENCY: 2x/week  OT DURATION: 8 weeks  PLANNED INTERVENTIONS: self care/ADL training, therapeutic exercise, therapeutic activity, neuromuscular re-education, manual therapy, passive range of motion, functional mobility training, aquatic therapy, splinting, ultrasound, iontophoresis, compression bandaging, moist heat, cryotherapy, patient/family education, visual/perceptual remediation/compensation, and DME and/or AE instructions  RECOMMENDED OTHER SERVICES: Currently receiving PT services at this location  CONSULTED AND AGREED WITH PLAN OF CARE: Patient  PLAN FOR NEXT SESSION: Orthosis check w/ adjustments if needed; continue to address [facility].  Review HEP and add putty exercises as appropriate.   Simonne Come, OTR/L 08/26/2021, 10:27 AM

## 2021-08-27 ENCOUNTER — Other Ambulatory Visit: Payer: Self-pay | Admitting: Internal Medicine

## 2021-08-27 DIAGNOSIS — E109 Type 1 diabetes mellitus without complications: Secondary | ICD-10-CM

## 2021-08-31 ENCOUNTER — Encounter: Payer: Self-pay | Admitting: Physical Therapy

## 2021-08-31 ENCOUNTER — Ambulatory Visit: Payer: Medicare Other | Admitting: Physical Therapy

## 2021-08-31 DIAGNOSIS — M6281 Muscle weakness (generalized): Secondary | ICD-10-CM | POA: Diagnosis not present

## 2021-08-31 DIAGNOSIS — R2689 Other abnormalities of gait and mobility: Secondary | ICD-10-CM | POA: Diagnosis not present

## 2021-08-31 DIAGNOSIS — R270 Ataxia, unspecified: Secondary | ICD-10-CM | POA: Diagnosis not present

## 2021-08-31 DIAGNOSIS — R2681 Unsteadiness on feet: Secondary | ICD-10-CM | POA: Diagnosis not present

## 2021-08-31 DIAGNOSIS — M7712 Lateral epicondylitis, left elbow: Secondary | ICD-10-CM | POA: Diagnosis not present

## 2021-08-31 DIAGNOSIS — R278 Other lack of coordination: Secondary | ICD-10-CM | POA: Diagnosis not present

## 2021-08-31 NOTE — Therapy (Signed)
OUTPATIENT PHYSICAL THERAPY NEURO TREATMENT   Patient Name: Joyce Brown MRN: [id number] DOB:May 23, 1950, 71 y.o., female Today's Date: 08/31/2021  PCP: Talbert Cage, MD REFERRING PROVIDER: Marcial Pacas, MD     END OF SESSION:   PT End of Session - 08/31/21 0743     Visit Number 13    Number of Visits 18    Date for PT Re-Evaluation 09/14/21    Authorization Type Medicare/[employer]    Progress Note Due on Visit 22    PT Start Time 0802    PT Stop Time 0846    PT Time Calculation (min) 44 min    Equipment Utilized During Treatment Gait belt    Activity Tolerance Patient tolerated treatment well    Behavior During Therapy WFL for tasks assessed/performed                   Past Medical History:  Diagnosis Date   Cerebellar ataxia ([place])    Diabetes mellitus without complication ([place])    Diabetic neuropathy ([place]) 12/09/2020   Foot fracture 2022   Gait abnormality 11/07/2018   Hyperlipidemia    Pulmonary embolism (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   Type 1 diabetes mellitus with diabetic polyneuropathy ([place]) 12/25/2020   Diabetic neuropathy ([place]) 12/09/2020   Myofascial pain 09/12/2020   Wheelchair dependence 09/12/2020   Dyslipidemia 06/19/2020   Spondylosis without myelopathy or radiculopathy, lumbar region 04/27/2020   Sensorineural hearing loss (SNHL) of both ears 08/21/2019   Tinnitus, bilateral 06/27/2019   FH: ovarian cancer in first degree relative 11/16/2018    Hypercholesteremia 11/15/2018   Gait abnormality 11/07/2018   Pain of left hip joint 05/18/2018   Chronic midline low back pain with bilateral sciatica 05/18/2018   Breast calcifications 12/28/2017   Fibromyalgia 12/06/2017   History of gastric bypass 12/06/2017   Spinal stenosis 12/06/2017   Asthma 12/06/2017   Cerebellar ataxia ([place])    Type I diabetes mellitus with complication, uncontrolled    Serum albumin decreased 11/10/2017   Vitamin B6 deficiency 11/10/2017   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   Hypoglycemia due to type 1 diabetes mellitus ([place]) 06/20/2016   Hyperparathyroidism ([place]) 10/29/2014   Increased PTH level 10/01/2014   Obstructive sleep apnea on CPAP 10/01/2014   Non-organic enuresis 05/28/2014   Urge incontinence of urine 05/28/2014   History of vaginal hysterectomy 05/24/2014   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   Pain of lumbar facet joint 04/11/2013   Mild persistent asthma without complication 89/38/1017   Carpal tunnel syndrome 51/04/5850   Lichen planus 77/82/4235   Adjustment disorder with mixed anxiety and depressed mood 05/11/2012   Degeneration of lumbar intervertebral disc 04/26/2012   Pain in joint involving ankle and foot 04/26/2012   Osteoarthritis of hand 05/24/2011   Leukopenia 09/25/2009    ONSET DATE: 07/06/2021 (MD referral); initial symptoms 2007  REFERRING DIAG: G11.9 (ICD-10-CM) - Cerebellar ataxia ([place]) R26.9 (ICD-10-CM) - Gait abnormality   THERAPY DIAG:  Unsteadiness on feet  Other abnormalities of gait and mobility  Muscle weakness (generalized)  SUBJECTIVE:                                                                                                                                                                                              SUBJECTIVE STATEMENT: Doing okay.  No changes.  Been working on walking short distance into the bathroom.  Did have an episode reaching my phone and lost my  balance and went down to the ground.  Didn't get hurt, able to get up on my own.  Pt accompanied by: self  PERTINENT HISTORY: Has Cerebellar atrophy with ataxia and also chronic low back pain s/p radiofrequency ablation 3/22; Has had trigger point injections for L tennis elbow. Has has Myofascial pain and fibromyalgia.  lumbar spondylosis, chronic LBP  PMH:  DM, neuropathy, closed foot fracture fall of 2022, HLD, PE, spinal stenosis, tennis elbow  PAIN:  Are you having pain? Yes: NPRS scale: 4/10 Pain location: L dorsal forearm Pain description: sharp, achy Aggravating factors: opening objects, extending fingers Relieving factors: trigger point injections (Seeing OT)  PRECAUTIONS: Fall  -hx of Type 1 DM, carries glucose tablets with her; follows the "Rule of 15"  PATIENT GOALS Pt's goals for therapy are to increase strength and mobility.     OBJECTIVE:   TODAY'S TREATMENT: 08/31/2021 Activity Comments  STS from chair with BUE support and min A 2x5; additional sit<>stand 5 additional reps with gait Cues for slowed pace; up for a 4-count; down for a 4-count  Gait with RW 4 x 50 ft; short distance gait 8 ft x 2 Cues to stay within BOS of RW; with fatigue, pt has increased episodes of posterior lean, needs min assist to regain balance.  NuStep, Level 5, x 6 minutes Cues to keep speed>55 SPM            PATIENT EDUCATION: Education details: Control with transfers (4-count up and down), short distance gait at home when she is NOT fatigued (for improved safety with gait and standing); discussed aquatic therapy and reaching out to [facility], PT, to get scheduled for that. Person educated: Patient Education method: Explanation Education comprehension: verbalized understanding     HEP last updated 08/24/21: Access Code: [email or web address] URL: [email or web address] Date: 08/24/2021 Prepared by: [facility]  Exercises - Seated Gluteal  Sets  - 1 x daily - 7 x weekly - 2 sets - 10 reps - 3  sec hold - Seated Active Hip Flexion  - 1 x daily - 7 x weekly - 2 sets - 10 reps - 5 sec hold - Seated Shoulder Row with Anchored Resistance  - 1 x daily - 5 x weekly - 2 sets - 10 reps - Seated Shoulder Extension and Scapular Retraction with Resistance  - 1 x daily - 5 x weekly - 2 sets - 10 reps - Modified Thomas Stretch  - 1 x daily - 5 x weekly - 2 sets - 30 sec hold - Sit to Stand with Counter Support  - 1 x daily - 5 x weekly - 2 sets - 5 reps - Lower Trunk Rotations  - 1-2 x daily - 7 x weekly - 1 sets - 5-10 reps - Hooklying Isometric Clamshell  - 1 x daily - 5 x weekly - 1-2 sets - 10 reps - Seated Heel Raise  - 1 x daily - 5 x weekly - 1-2 sets - 10 reps - Standing Hip Abduction with Counter Support  - 1 x daily - 5 x weekly - 2 sets - 10 reps   ________________________________________________________________________  OBJECTIVE: measures were taken at time of initial evaluation unless otherwise specified  MMT:    MMT Right  Left   Hip flexion 5/5 5/5  Hip extension    Hip abduction 4/5 4/5  Hip adduction 4/5 4/5  Hip internal rotation    Hip external rotation    Knee flexion 5/5 5/5  Knee extension 5/5 5/5  Ankle dorsiflexion 3+/5 3+/5  Ankle plantarflexion    Ankle inversion    Ankle eversion    (Blank rows = not tested)  TRANSFERS: Assistive device utilized: Environmental consultant - 2 wheeled  Sit to stand: SBA Stand to sit: SBA Pt stands at [employer] x 1:26, prior to needing to sit.  GAIT: Gait pattern: step to pattern, step through pattern, ataxic, and wide BOS Distance walked: 20 ft Assistive device utilized: Walker - 2 wheeled Level of assistance: CGA and Min A Comments: With TUG test.  Pt reports difficulty with ramp/incline negotiation.  FUNCTIONAL TESTs:  Timed up and go (TUG): 53.34 sec BERG BALANCE: 28/56   HOME EXERCISE PROGRAM: Med[employer] Access Code: [email or web address] URL: [email or web address] Date:  07/20/2021 Prepared by: [facility]  _______________________________________________________________________________    GOALS: Goals reviewed with patient? Yes  SHORT TERM GOALS: Target date: 08/17/2021  Pt will be independent with HEP for improved strength, standing, transfers, balance. Baseline: Goal status: GOAL MET  2.  Pt perform standing at counter x 3 minutes with UE support for improved standing tolerance. Baseline: 1 min, 26 sec at eval Goal status: GOAL MET    LONG TERM GOALS: Target date: 09/14/2021   Pt will be independent with HEP for improved strength, balance, transfers, and gait. Baseline: initiated Goal status: IN PROGRESS  2.  Pt will perform at least 5 minutes of standing at counter/RW for improved standing balance and performance with ADLs. Baseline: unable at start of care. 5 min with BUE-unilat support Goal status: MET  3.  Pt will improve TUG score to less than or equal to 35 sec for decreased fall risk. Baseline: 50.34 sec; 53.34 sec Goal status: IN PROGRESS  4.  Berg Balance test to be assessed, with score to improve by at least 5 points for decreased fall risk. Baseline: 19/56 07/22/21 at start of care; 28/56 on 08/19/21 Goal status: REVISED  5.  Pt will verbalize plans  for ongoing community fitness, including aquatic fitness options, upon d/c from PT. Baseline:  Goal status: IN PROGRESS   ASSESSMENT:  CLINICAL IMPRESSION: Skilled PT session today focused on sit<>stand transfer technique as well as gait training with RW.  Pt able to ambulate additional bout of gait compared to last session, but with decreased control noted with fatigue on last bout.  With focus and cues, pt is able to slow pace of transfers for increased safety.  Pt is demonstrating overall improved strength and endurance for transfers and gait.  She will continue to benefit from skilled PT to address balance, gait, strength for improved overall  functional mobility and safety with gait.  OBJECTIVE IMPAIRMENTS Abnormal gait, decreased activity tolerance, decreased balance, decreased endurance, decreased mobility, difficulty walking, decreased strength, and postural dysfunction.   ACTIVITY LIMITATIONS community activity and community fitness .   PERSONAL FACTORS  PMH:  Cerebellar atrophy with ataxia, chronic low back pain s/p radiofrequency ablation 3/22; trigger point injections for L tennis elbow; myofascial pain and fibromyalgia.  lumbar spondylosis.  DM, neuropathy, closed foot fracture fall of 2022, HLD, PE, spinal stenosis, are also affecting patient's functional outcome.    REHAB POTENTIAL: Good  CLINICAL DECISION MAKING: Evolving/moderate complexity  EVALUATION COMPLEXITY: Moderate  PLAN: PT FREQUENCY: 2x/week  PT DURATION: 8 weeks  including eval week  PLANNED INTERVENTIONS: Therapeutic exercises, Therapeutic activity, Neuromuscular re-education, Balance training, Gait training, Patient/Family education, Joint mobilization, DME instructions, Aquatic Therapy, and Manual therapy  PLAN FOR NEXT SESSION: .  Continue to work on short distance gait, progression of standing exercises, trunk turns in standing.Continue to progress lower extremity strength and standing balance. Core stability, posture exercises. Standing exercises at counter-try head turns/nods, lateral or A/P weightshifting, reaching tasks.  At some point in [place], plan for transition, maybe 1x/wk to aquatic therapy-potentially beginning of July *(6-19:  Suezanne Cheshire UPJSRP about aquatic therapy to ask about scheduling for pool)  Mady Haagensen, PT 08/31/21 8:48 AM Phone: [phone] Fax: [place] [facility] [address], [place], [place] [address] Phone # [phone] Fax # [phone]

## 2021-09-02 ENCOUNTER — Encounter: Payer: Self-pay | Admitting: Physical Therapy

## 2021-09-02 ENCOUNTER — Ambulatory Visit: Payer: Medicare Other | Admitting: Physical Therapy

## 2021-09-02 DIAGNOSIS — M7712 Lateral epicondylitis, left elbow: Secondary | ICD-10-CM | POA: Diagnosis not present

## 2021-09-02 DIAGNOSIS — M6281 Muscle weakness (generalized): Secondary | ICD-10-CM

## 2021-09-02 DIAGNOSIS — R278 Other lack of coordination: Secondary | ICD-10-CM | POA: Diagnosis not present

## 2021-09-02 DIAGNOSIS — R2689 Other abnormalities of gait and mobility: Secondary | ICD-10-CM | POA: Diagnosis not present

## 2021-09-02 DIAGNOSIS — R2681 Unsteadiness on feet: Secondary | ICD-10-CM | POA: Diagnosis not present

## 2021-09-02 DIAGNOSIS — R270 Ataxia, unspecified: Secondary | ICD-10-CM | POA: Diagnosis not present

## 2021-09-02 NOTE — Patient Instructions (Signed)
Aquatic Therapy: What to Expect!  Where:  [facility] at [address] [place], [place] [address] [phone]           How to Prepare:  If you require assistance with dressing, with transportation (ie: wheel chair), or toileting, a caregiver must attend the entire session with you (unless your primary therapists feels this is not necessary).   If there is thunder during your appointment, you will be asked to leave pool area. You have the option to finish your session in the physical therapy area near the gym. Masks in the pool area are optional. Your face will remain dry during your session, so you are welcome to keep your mask on, if desired. You will be spaced at least 6 feet from other aquatic patients.  Please bring your own swim towel to dry off with.   There are Men's and Women's locker rooms with showers, as well as gender neutral bathrooms in the pool area.  Please arrive IN YOUR SUIT and a few minutes prior to your appointment - this helps to avoid delays in starting your session. Head to the pool and await your appointment on the bench on the pool deck. Please make sure to attend to any toileting needs prior to entering the pool. Once on the pool deck your therapist will ask you to sign the Patient  Consent and Assignment of Benefits form. Your therapist may take your blood pressure prior to, during and after your session if indicated. We usually try and create a home exercise program based on activities we do in the pool. Some patients do not want to or do not have the ability to participate in an aquatic home program - this is not a barrier in any way to you participating in aquatic therapy as part of your current therapy plan!  Appointments:  All sessions are 45 minutes  About the pool: Entering the pool: Your therapist will assist you if needed; there are two ways to enter the pool - stairs or a mechanical lift. Your therapist will determine  the most appropriate way for you. Water temperature is usually around 91-95.  There is a lap pool with a temperature around 84 There may be other therapists and patients in the pool at the same time.   Contact Info:            To cancel appointment, please call [facility] at [phone] If you are running late, please call [facility] at [phone]                   

## 2021-09-02 NOTE — Therapy (Signed)
OUTPATIENT PHYSICAL THERAPY NEURO TREATMENT/RECERT   Patient Name: Joyce Brown MRN: [id number] DOB:1951-02-27, 71 y.o., female Today's Date: 09/02/2021  PCP: Talbert Cage, MD REFERRING PROVIDER: Marcial Pacas, MD     END OF SESSION:   PT End of Session - 09/02/21 0755     Visit Number 14    Number of Visits 30    Date for PT Re-Evaluation 10/28/21    Authorization Type Medicare/AARP    Progress Note Due on Visit 31    PT Start Time 0802    PT Stop Time 0845    PT Time Calculation (min) 43 min    Equipment Utilized During Treatment Gait belt    Activity Tolerance Patient tolerated treatment well    Behavior During Therapy WFL for tasks assessed/performed                   Past Medical History:  Diagnosis Date   Cerebellar ataxia ([place])    Diabetes mellitus without complication ([place])    Diabetic neuropathy ([place]) 12/09/2020   Foot fracture 2022   Gait abnormality 11/07/2018   Hyperlipidemia    Pulmonary embolism ([place])    Sleep apnea    Spinal stenosis    Past Surgical History:  Procedure Laterality Date   ABDOMINAL HYSTERECTOMY     ADENOIDECTOMY     ANKLE SURGERY Left    BILATERAL CARPAL TUNNEL RELEASE     BREAST BIOPSY Left 2019   benign   ELBOW SURGERY     GASTRIC BYPASS  2014   HAND NEUROPLASTY     radio denervation  05/21/2020   TONSILLECTOMY     Patient Active Problem List   Diagnosis Date Noted   H/O bariatric surgery 06/29/2021   Bilateral tennis elbow 06/15/2021   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   Type 1 diabetes mellitus with diabetic polyneuropathy ([place]) 12/25/2020   Diabetic neuropathy ([place]) 12/09/2020   Myofascial pain 09/12/2020   Wheelchair dependence 09/12/2020   Dyslipidemia 06/19/2020   Spondylosis without myelopathy or radiculopathy, lumbar region 04/27/2020   Sensorineural hearing loss (SNHL) of both ears 08/21/2019   Tinnitus, bilateral 06/27/2019   FH: ovarian cancer in first degree relative 11/16/2018    Hypercholesteremia 11/15/2018   Gait abnormality 11/07/2018   Pain of left hip joint 05/18/2018   Chronic midline low back pain with bilateral sciatica 05/18/2018   Breast calcifications 12/28/2017   Fibromyalgia 12/06/2017   History of gastric bypass 12/06/2017   Spinal stenosis 12/06/2017   Asthma 12/06/2017   Cerebellar ataxia ([place])    Type I diabetes mellitus with complication, uncontrolled    Serum albumin decreased 11/10/2017   Vitamin B6 deficiency 11/10/2017   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   Hypoglycemia due to type 1 diabetes mellitus ([place]) 06/20/2016   Hyperparathyroidism ([place]) 10/29/2014   Increased PTH level 10/01/2014   Obstructive sleep apnea on CPAP 10/01/2014   Non-organic enuresis 05/28/2014   Urge incontinence of urine 05/28/2014   History of vaginal hysterectomy 05/24/2014   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   Pain of lumbar facet joint 04/11/2013   Mild persistent asthma without complication 57/84/6962   Carpal tunnel syndrome 95/28/4132   Lichen planus 44/03/270   Adjustment disorder with mixed anxiety and depressed mood 05/11/2012   Degeneration of lumbar intervertebral disc 04/26/2012   Pain in joint involving ankle and foot 04/26/2012   Osteoarthritis of hand 05/24/2011   Leukopenia 09/25/2009    ONSET DATE: 07/06/2021 (MD referral); initial symptoms 2007  REFERRING DIAG: G11.9 (ICD-10-CM) - Cerebellar ataxia ([place]) R26.9 (ICD-10-CM) - Gait abnormality   THERAPY DIAG:  Unsteadiness on feet  Other abnormalities of gait and mobility  Muscle weakness (generalized)  SUBJECTIVE:                                                                                                                                                                                              SUBJECTIVE STATEMENT: I talked to Vinnie Level and will plan to start aquatic therapy at the pool next week.  Been trying to do more standing at home.  Trying to be  more mindful about the walking and keeping weight forward.  Pt accompanied by: self  PERTINENT HISTORY: Has Cerebellar atrophy with ataxia and also chronic low back pain s/p radiofrequency ablation 3/22; Has had trigger point injections for L tennis elbow. Has has Myofascial pain and fibromyalgia.  lumbar spondylosis, chronic LBP  PMH:  DM, neuropathy, closed foot fracture fall of 2022, HLD, PE, spinal stenosis, tennis elbow  PAIN:  PAIN:  Are you having pain? Yes: NPRS scale: 3/10 Pain location: low back Pain description: achy Aggravating factors: walking, twisting motions Relieving factors: heat    PRECAUTIONS: Fall  -hx of Type 1 DM, carries glucose tablets with her; follows the "Rule of 15"  PATIENT GOALS Pt's goals for therapy are to increase strength and mobility.     OBJECTIVE:   TODAY'S TREATMENT: 09/02/2021 Activity Comments  TUG score:  Trial 1:  47.53 sec Trial 2:  43.82 sec, Trial 3:  50.16 sec Using RW  Gait using RW, 30 ft, then 20 ft   Stair training:  up 3 4" steps with Bilat rails with step to pattern, down with step through pattern; then side step up and down steps with step-to pattern with min assist   Reviewed standing hip abduction at counter, BUE support x 10 reps   Standing hip ant/posterior weightshifting x 10 reps with min guard assist; lateral weightshifting x 10 reps Min guard for control through hips  NuStep, Level 5, x 7 minutes Keeps speed 50-55 SPM  Sit<>Stand from w/c at least 5 reps through session with BUE support and supervision Improved control with sit>stand; decreased eccentric control to sit several times    Education details: PT POC and progress towards goals; addition of aquatics to schedule next week Person educated: Patient Education method: Explanation and Handouts Education comprehension: verbalized understanding     HEP last updated 08/24/21: Access Code: [email or web address] URL: [email or web address] Date:  08/24/2021 Prepared by: [facility]  Exercises - Seated Gluteal Sets  -  1 x daily - 7 x weekly - 2 sets - 10 reps - 3 sec hold - Seated Active Hip Flexion  - 1 x daily - 7 x weekly - 2 sets - 10 reps - 5 sec hold - Seated Shoulder Row with Anchored Resistance  - 1 x daily - 5 x weekly - 2 sets - 10 reps - Seated Shoulder Extension and Scapular Retraction with Resistance  - 1 x daily - 5 x weekly - 2 sets - 10 reps - Modified Thomas Stretch  - 1 x daily - 5 x weekly - 2 sets - 30 sec hold - Sit to Stand with Counter Support  - 1 x daily - 5 x weekly - 2 sets - 5 reps - Lower Trunk Rotations  - 1-2 x daily - 7 x weekly - 1 sets - 5-10 reps - Hooklying Isometric Clamshell  - 1 x daily - 5 x weekly - 1-2 sets - 10 reps - Seated Heel Raise  - 1 x daily - 5 x weekly - 1-2 sets - 10 reps - Standing Hip Abduction with Counter Support  - 1 x daily - 5 x weekly - 2 sets - 10 reps   ________________________________________________________________________  OBJECTIVE: measures were taken at time of initial evaluation unless otherwise specified  MMT:    MMT Right  Left   Hip flexion 5/5 5/5  Hip extension    Hip abduction 4/5 4/5  Hip adduction 4/5 4/5  Hip internal rotation    Hip external rotation    Knee flexion 5/5 5/5  Knee extension 5/5 5/5  Ankle dorsiflexion 3+/5 3+/5  Ankle plantarflexion    Ankle inversion    Ankle eversion    (Blank rows = not tested)  TRANSFERS: Assistive device utilized: Environmental consultant - 2 wheeled  Sit to stand: SBA Stand to sit: SBA Pt stands at [employer] x 1:26, prior to needing to sit.  GAIT: Gait pattern: step to pattern, step through pattern, ataxic, and wide BOS Distance walked: 20 ft Assistive device utilized: Walker - 2 wheeled Level of assistance: CGA and Min A Comments: With TUG test.  Pt reports difficulty with ramp/incline negotiation.  FUNCTIONAL TESTs:  Timed up and go (TUG): 53.34 sec BERG BALANCE: 28/56  (08/19/21)   HOME EXERCISE PROGRAM: [facility] Access Code: [email or web address] URL: [email or web address] Date: 07/20/2021 Prepared by: [facility]  _______________________________________________________________________________    GOALS: Goals reviewed with patient? Yes  SHORT TERM GOALS: Target date: 08/17/2021  Pt will be independent with HEP for improved strength, standing, transfers, balance. Baseline: Goal status: GOAL MET  2.  Pt perform standing at counter x 3 minutes with UE support for improved standing tolerance. Baseline: 1 min, 26 sec at eval Goal status: GOAL MET    LONG TERM GOALS: Target date: 09/14/2021   Pt will be independent with HEP for improved strength, balance, transfers, and gait. Baseline: initiated Goal status: ONGOING  2.  Pt will perform at least 5 minutes of standing at counter/RW for improved standing balance and performance with ADLs. Baseline: unable at start of care. 5 min with BUE-unilat support Goal status: MET  3.  Pt will improve TUG score to less than or equal to 35 sec for decreased fall risk. Baseline: 50.34 sec; 53.34 sec>47.53, 43.82 sec 09/02/2021 Goal status: NOT MET, ONGOING  4.  Berg Balance test to be assessed, with score to improve by at least 5 points for decreased fall risk. Baseline:  19/56 07/22/21 at start of care; 28/56 on 08/19/21 Goal status: GOAL MET  5.  Pt will verbalize plans for ongoing community fitness, including aquatic fitness options, upon d/c from PT. Baseline:  Goal status: ONGOING  FOR RECERT 6/75/9163: SHORT TERM GOALS: Target date: 09/30/2021  Pt will be independent with progression of HEP for improved strength, balance, gait and progression to use of apartment complex pool for exercise. Baseline: Goal status: INITIAL  2.  Pt will improve 5x sit<>stand to less with no LOB and mod independently, to demonstrate improved functional strength and transfer  efficiency. Baseline: decreased eccentric control; supervision Goal status: INITIAL  LONG TERM GOALS: Target date: 10/28/2021  Pt will be independent with final progression of HEP for improved strength, balance, transfers, and gait. Baseline:  Goal status: IN PROGRESS  2.  Pt will verbalize understanding of fall prevention in home environment.   Baseline:  Goal Status:  INITIAL  3.  Pt will improve TUG score to less than or equal to 35 sec for decreased fall risk. Baseline:  43.82 sec 09/02/2021 Goal status:  ONGOING  4.  Berg Balance test to improve to at least 33/56 for decreased fall risk. Baseline: 19/56 07/22/21 at start of care; 28/56 on 08/19/21 Goal status: REVISED 5.  Pt will verbalize plans for ongoing community fitness, including aquatic fitness options, upon d/c from PT. Baseline:  Goal status: ONGOING   CLINICAL IMPRESSION: Assessed LTGs this visit, with pt meeting 2 of 5 LTGs.  Pt has met LTG 2 for improved standing tolerance to 5 minutes (improved from 1:26 at eval) and LTG 4 for improved Berg score to 28/56 (from initial score of 19/56).  She is improving with TUG score to 43.82 sec (from 50.34), but just not to goal level.  LTG 1, 3, 5 not yet met and ongoing. Pt is demonstrating improved functional strength, balance, and gait; however, she remains at fall risk per Berg and TUG scores.  She is planning to transition to aquatic therapy next week in addition to her in-clinic sessions.  She will continue to benefit from skilled PT to address functional deficits to decrease fall risk and improve overall functional mobility.    OBJECTIVE IMPAIRMENTS Abnormal gait, decreased activity tolerance, decreased balance, decreased endurance, decreased mobility, difficulty walking, decreased strength, and postural dysfunction.   ACTIVITY LIMITATIONS community activity and community fitness .   PERSONAL FACTORS  PMH:  Cerebellar atrophy with ataxia, chronic low back pain s/p  radiofrequency ablation 3/22; trigger point injections for L tennis elbow; myofascial pain and fibromyalgia.  lumbar spondylosis.  DM, neuropathy, closed foot fracture fall of 2022, HLD, PE, spinal stenosis, are also affecting patient's functional outcome.    REHAB POTENTIAL: Good  CLINICAL DECISION MAKING: Evolving/moderate complexity  EVALUATION COMPLEXITY: Moderate  PLAN: PT FREQUENCY: 2x/week  PT DURATION: 8 weeks  (per recert 8/46/6599)  PLANNED INTERVENTIONS: Therapeutic exercises, Therapeutic activity, Neuromuscular re-education, Balance training, Gait training, Patient/Family education, Joint mobilization, DME instructions, Aquatic Therapy, and Manual therapy  PLAN FOR NEXT SESSION: .  Recert completed this visit; Continue to work on short distance gait, progression of standing exercises, stair negotiation for when pt can use pool at her apartment complex.  Continue to progress lower extremity strength and standing balance. Core stability, posture exercises. Standing exercises at counter-head turns/nods, lateral or A/P weightshifting, reaching tasks.  Pt to start aquatic therapy next week.  Mady Haagensen, PT 09/02/21 9:08 AM Phone: [phone] Fax: [phone]   [facility]  Neuro [address] [place], [place] [address] Phone # [phone] Fax # [phone]

## 2021-09-07 ENCOUNTER — Ambulatory Visit (INDEPENDENT_AMBULATORY_CARE_PROVIDER_SITE_OTHER): Payer: Medicare Other | Admitting: Podiatry

## 2021-09-07 ENCOUNTER — Encounter: Payer: Self-pay | Admitting: Podiatry

## 2021-09-07 ENCOUNTER — Ambulatory Visit: Payer: Medicare Other | Admitting: Physical Therapy

## 2021-09-07 ENCOUNTER — Ambulatory Visit (INDEPENDENT_AMBULATORY_CARE_PROVIDER_SITE_OTHER): Payer: Medicare Other

## 2021-09-07 DIAGNOSIS — E1042 Type 1 diabetes mellitus with diabetic polyneuropathy: Secondary | ICD-10-CM | POA: Diagnosis not present

## 2021-09-07 DIAGNOSIS — M2041 Other hammer toe(s) (acquired), right foot: Secondary | ICD-10-CM

## 2021-09-07 DIAGNOSIS — B351 Tinea unguium: Secondary | ICD-10-CM

## 2021-09-07 DIAGNOSIS — M79675 Pain in left toe(s): Secondary | ICD-10-CM

## 2021-09-07 DIAGNOSIS — M2040 Other hammer toe(s) (acquired), unspecified foot: Secondary | ICD-10-CM

## 2021-09-07 DIAGNOSIS — M2012 Hallux valgus (acquired), left foot: Secondary | ICD-10-CM

## 2021-09-07 DIAGNOSIS — M79674 Pain in right toe(s): Secondary | ICD-10-CM

## 2021-09-07 DIAGNOSIS — M2010 Hallux valgus (acquired), unspecified foot: Secondary | ICD-10-CM

## 2021-09-08 ENCOUNTER — Other Ambulatory Visit: Payer: Self-pay | Admitting: Family Medicine

## 2021-09-08 ENCOUNTER — Encounter: Payer: Self-pay | Admitting: Physical Therapy

## 2021-09-08 ENCOUNTER — Ambulatory Visit: Payer: Medicare Other | Admitting: Physical Therapy

## 2021-09-08 DIAGNOSIS — R2681 Unsteadiness on feet: Secondary | ICD-10-CM | POA: Diagnosis not present

## 2021-09-08 DIAGNOSIS — R278 Other lack of coordination: Secondary | ICD-10-CM | POA: Diagnosis not present

## 2021-09-08 DIAGNOSIS — R2689 Other abnormalities of gait and mobility: Secondary | ICD-10-CM | POA: Diagnosis not present

## 2021-09-08 DIAGNOSIS — Z1231 Encounter for screening mammogram for malignant neoplasm of breast: Secondary | ICD-10-CM

## 2021-09-08 DIAGNOSIS — R270 Ataxia, unspecified: Secondary | ICD-10-CM | POA: Diagnosis not present

## 2021-09-08 DIAGNOSIS — M6281 Muscle weakness (generalized): Secondary | ICD-10-CM | POA: Diagnosis not present

## 2021-09-08 DIAGNOSIS — M7712 Lateral epicondylitis, left elbow: Secondary | ICD-10-CM | POA: Diagnosis not present

## 2021-09-08 NOTE — Therapy (Signed)
OUTPATIENT PHYSICAL THERAPY NEURO TREATMENT   Patient Name: Joyce Brown MRN: [id number] DOB:22-Dec-1950, 71 y.o., female Today's Date: 09/08/2021  PCP: Talbert Cage, MD REFERRING PROVIDER: Marcial Pacas, MD     END OF SESSION:   PT End of Session - 09/08/21 1357     Visit Number 15    Number of Visits 30    Date for PT Re-Evaluation 10/28/21    Authorization Type Medicare/AARP    Progress Note Due on Visit 28    PT Start Time 0935    PT Stop Time 1013    PT Time Calculation (min) 38 min    Equipment Utilized During Treatment Other (comment)   large bar bell, single bar bells, aquatic cuffs   Activity Tolerance Patient tolerated treatment well    Behavior During Therapy WFL for tasks assessed/performed                   Past Medical History:  Diagnosis Date   Cerebellar ataxia ([place])    Diabetes mellitus without complication ([place])    Diabetic neuropathy ([place]) 12/09/2020   Foot fracture 2022   Gait abnormality 11/07/2018   Hyperlipidemia    Pulmonary embolism ([place])    Sleep apnea    Spinal stenosis    Past Surgical History:  Procedure Laterality Date   ABDOMINAL HYSTERECTOMY     ADENOIDECTOMY     ANKLE SURGERY Left    BILATERAL CARPAL TUNNEL RELEASE     BREAST BIOPSY Left 2019   benign   ELBOW SURGERY     GASTRIC BYPASS  2014   HAND NEUROPLASTY     radio denervation  05/21/2020   TONSILLECTOMY     Patient Active Problem List   Diagnosis Date Noted   H/O bariatric surgery 06/29/2021   Bilateral tennis elbow 06/15/2021   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   Type 1 diabetes mellitus with diabetic polyneuropathy ([place]) 12/25/2020   Diabetic neuropathy ([place]) 12/09/2020   Myofascial pain 09/12/2020   Wheelchair dependence 09/12/2020   Dyslipidemia 06/19/2020   Spondylosis without myelopathy or radiculopathy, lumbar region 04/27/2020   Sensorineural hearing loss (SNHL) of both ears 08/21/2019   Tinnitus, bilateral 06/27/2019   FH:  ovarian cancer in first degree relative 11/16/2018   Hypercholesteremia 11/15/2018   Gait abnormality 11/07/2018   Pain of left hip joint 05/18/2018   Chronic midline low back pain with bilateral sciatica 05/18/2018   Breast calcifications 12/28/2017   Fibromyalgia 12/06/2017   History of gastric bypass 12/06/2017   Spinal stenosis 12/06/2017   Asthma 12/06/2017   Cerebellar ataxia ([place])    Type I diabetes mellitus with complication, uncontrolled    Serum albumin decreased 11/10/2017   Vitamin B6 deficiency 11/10/2017   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   Hypoglycemia due to type 1 diabetes mellitus ([place]) 06/20/2016   Hyperparathyroidism ([place]) 10/29/2014   Increased PTH level 10/01/2014   Obstructive sleep apnea on CPAP 10/01/2014   Non-organic enuresis 05/28/2014   Urge incontinence of urine 05/28/2014   History of vaginal hysterectomy 05/24/2014   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   Pain of lumbar facet joint 04/11/2013   Mild persistent asthma without complication 70/17/7939   Carpal tunnel syndrome 03/00/9233   Lichen planus 00/76/2263   Adjustment disorder with mixed anxiety and depressed mood 05/11/2012   Degeneration of lumbar intervertebral disc 04/26/2012   Pain in joint involving ankle and foot 04/26/2012   Osteoarthritis of hand 05/24/2011   Leukopenia 09/25/2009  ONSET DATE: 07/06/2021 (MD referral); initial symptoms 2007  REFERRING DIAG: G11.9 (ICD-10-CM) - Cerebellar ataxia ([place]) R26.9 (ICD-10-CM) - Gait abnormality   THERAPY DIAG:  Unsteadiness on feet  Other abnormalities of gait and mobility  Muscle weakness (generalized)   PERTINENT HISTORY: Has Cerebellar atrophy with ataxia and also chronic low back pain s/p radiofrequency ablation 3/22; Has had trigger point injections for L tennis elbow. Has has Myofascial pain and fibromyalgia.  lumbar spondylosis, chronic LBP   PMH:  DM, neuropathy, closed foot fracture fall of 2022,  HLD, PE, spinal stenosis, tennis elbow                                                                                                                                                                                             PRECAUTIONS: Fall. hx of Type 1 DM, carries glucose tablets with her; follows the "Rule of 15"   PATIENT GOALS: Pt's goals for therapy are to increase strength and mobility.     SUBJECTIVE: No new complaints. Presents for first aquatic therapy session. No falls. Pain is "the same".    PAIN:  Are you having pain? Yes: NPRS scale: 3/10 Pain location: low back Pain description: achy Aggravating factors: walking, twisting motions Relieving factors: heat        TODAY'S TREATMENT: Aquatic therapy at [facility] - pool temp 90 degrees Patient seen for aquatic therapy today.  Treatment took place in water 3.5-4.5 feet deep depending upon activity.  Pt entered/exited pool via stairs with bil rails, step to progression with min guard assist to descend and min assist to ascend.   Use of large bar bell for gait forward from stairs till about 4.0 to 4.3 foot water depth. Use of large bar bell for gait 18 feet across pool with min/mod assist for balance as follows: Forward x 8 laps with cues for posture/hip extension, step placement and weight shifting Backward x 8 laps with cues for posture and step length Side stepping left<>right x 5 laps toward each side with cues for posture and step length.   At wall in ~4.0 foot water depth with UE support on pool wall: facing the wall pt performed the following ex's with cues on form/technique, min guard assist for safety, aquatic cuffs to bil ankles. Heel toe raises x 15 reps Alternating slow high knee marching  15 reps each side Alternating HS curls x 15 reps each side Alternating hip abd/add x 15 reps each side Alternating hip extension x 15 reps each side  Aquatic cuffs removed and use of large bar bell for gait from deeper  end to bench in  shallow end of pool with min/mod assist.   Seated at edge of bench with feet on floor, use of single bar bells for the following activities: Shoulder horizontal abd/add at water level for 15 reps Holding bar bells out in front- alternating lowering bar bell down under water to knee<>back up to water surface for 15 reps each side with assist at knees to stabilize pt/keep her from floating away Holding bar bells out at sides: alternating lowering one at a time down to bench<>back to water surface for 10 reps each side.   Use of wall/large bar bell for gait from bench in water to stairs to exit pool with min/mod assist.      Pt requires buoyancy of water for support for reduced fall risk with gait training and balance exercises with minimal UE support; exercises able to be performed safely in water without the risk of fall compared to those same exercises performed on land;  viscosity of water needed for resistance for strengthening.  Current of water provides perturbations for challenging static & dynamic standing balance.         PATIENT EDUCATION: Education details: intial aquatic session, plan to schedule more appointments.  Person educated: Patient Education method: Explanation, Demonstration, and Verbal cues Education comprehension: verbalized understanding and needs further education      HOME EXERCISE PROGRAM: [facility] Access Code: [email or web address] URL: [email or web address] Date: 07/20/2021 Prepared by: [facility]     GOALS:  SHORT TERM GOALS: Target date: 09/30/2021  Pt will be independent with progression of HEP for improved strength, balance, gait and progression to use of apartment complex pool for exercise. Baseline: Goal status: INITIAL  2.  Pt will improve 5x sit<>stand to less with no LOB and mod independently, to demonstrate improved functional strength and transfer efficiency. Baseline: decreased  eccentric control; supervision Goal status: INITIAL  LONG TERM GOALS: Target date: 10/28/2021  Pt will be independent with final progression of HEP for improved strength, balance, transfers, and gait. Baseline:  Goal status: IN PROGRESS  2.  Pt will verbalize understanding of fall prevention in home environment.   Baseline:  Goal Status:  INITIAL  3.  Pt will improve TUG score to less than or equal to 35 sec for decreased fall risk. Baseline:  43.82 sec 09/02/2021 Goal status:  ONGOING  4.  Berg Balance test to improve to at least 33/56 for decreased fall risk. Baseline: 19/56 07/22/21 at start of care; 28/56 on 08/19/21 Goal status: REVISED 5.  Pt will verbalize plans for ongoing community fitness, including aquatic fitness options, upon d/c from PT. Baseline:  Goal status: ONGOING  ASSESSMENT:  CLINICAL IMPRESSION: Today' skilled session focused on initiating aquatic therapy to address gait, strengthening, flexibility and balance with no issues noted or reported in session. The pt does need up to mod assist for balance at times during session, otherwise min guard to min assist. The pt should benefit from continued PT to progress toward unmet goals.   OBJECTIVE IMPAIRMENTS Abnormal gait, decreased activity tolerance, decreased balance, decreased endurance, decreased mobility, difficulty walking, decreased strength, and postural dysfunction.   ACTIVITY LIMITATIONS community activity and community fitness .   PERSONAL FACTORS  PMH:  Cerebellar atrophy with ataxia, chronic low back pain s/p radiofrequency ablation 3/22; trigger point injections for L tennis elbow; myofascial pain and fibromyalgia.  lumbar spondylosis.  DM, neuropathy, closed foot fracture fall of 2022, HLD, PE, spinal stenosis, are also affecting patient's functional outcome.  REHAB POTENTIAL: Good  CLINICAL DECISION MAKING: Evolving/moderate complexity  EVALUATION COMPLEXITY: Moderate  PLAN: PT FREQUENCY:  2x/week  PT DURATION: 8 weeks  (per recert 4/49/6759)  PLANNED INTERVENTIONS: Therapeutic exercises, Therapeutic activity, Neuromuscular re-education, Balance training, Gait training, Patient/Family education, Joint mobilization, DME instructions, Aquatic Therapy, and Manual therapy  PLAN FOR NEXT SESSION: .  [place]: Continue to work on short distance gait, progression of standing exercises, stair negotiation for when pt can use pool at her apartment complex.  Continue to progress lower extremity strength and standing balance. Core stability, posture exercises. Standing exercises at counter-head turns/nods, lateral or A/P weightshifting, reaching tasks.     AQUATICS: continue to work on gait, balance and stretching with UE support as needed.     Willow Ora, PTA, [facility] [address] [place], [place] [address] [phone] 09/08/21, 1:58 PM

## 2021-09-09 ENCOUNTER — Ambulatory Visit: Payer: Medicare Other | Admitting: Occupational Therapy

## 2021-09-09 ENCOUNTER — Ambulatory Visit: Payer: Medicare Other | Admitting: Physical Therapy

## 2021-09-09 ENCOUNTER — Encounter: Payer: Self-pay | Admitting: Occupational Therapy

## 2021-09-09 ENCOUNTER — Encounter: Payer: Self-pay | Admitting: Physical Therapy

## 2021-09-09 DIAGNOSIS — R270 Ataxia, unspecified: Secondary | ICD-10-CM | POA: Diagnosis not present

## 2021-09-09 DIAGNOSIS — M7712 Lateral epicondylitis, left elbow: Secondary | ICD-10-CM

## 2021-09-09 DIAGNOSIS — R278 Other lack of coordination: Secondary | ICD-10-CM

## 2021-09-09 DIAGNOSIS — R2681 Unsteadiness on feet: Secondary | ICD-10-CM

## 2021-09-09 DIAGNOSIS — M6281 Muscle weakness (generalized): Secondary | ICD-10-CM

## 2021-09-09 DIAGNOSIS — R2689 Other abnormalities of gait and mobility: Secondary | ICD-10-CM

## 2021-09-09 DIAGNOSIS — M7711 Lateral epicondylitis, right elbow: Secondary | ICD-10-CM

## 2021-09-09 NOTE — Therapy (Unsigned)
OUTPATIENT OCCUPATIONAL THERAPY TREATMENT NOTE   Patient Name: Joyce Brown MRN: [id number] DOB:April 22, 1950, 71 y.o., female Today's Date: 09/09/2021  PCP: Talbert Cage, MD REFERRING PROVIDER: Marcial Pacas, MD  END OF SESSION:   OT End of Session - 09/09/21 1423     Visit Number 8    Number of Visits 17    Date for OT Re-Evaluation 10/07/21    Authorization Type Medicare    Progress Note Due on Visit 10    OT Start Time 1400    OT Stop Time 8295    OT Time Calculation (min) 45 min    Activity Tolerance Patient tolerated treatment well    Behavior During Therapy WFL for tasks assessed/performed            Past Medical History:  Diagnosis Date   Cerebellar ataxia ([place])    Diabetes mellitus without complication ([place])    Diabetic neuropathy ([place]) 12/09/2020   Foot fracture 2022   Gait abnormality 11/07/2018   Hyperlipidemia    Pulmonary embolism ([place])    Sleep apnea    Spinal stenosis    Past Surgical History:  Procedure Laterality Date   ABDOMINAL HYSTERECTOMY     ADENOIDECTOMY     ANKLE SURGERY Left    BILATERAL CARPAL TUNNEL RELEASE     BREAST BIOPSY Left 2019   benign   ELBOW SURGERY     GASTRIC BYPASS  2014   HAND NEUROPLASTY     radio denervation  05/21/2020   TONSILLECTOMY     Patient Active Problem List   Diagnosis Date Noted   H/O bariatric surgery 06/29/2021   Bilateral tennis elbow 06/15/2021   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   Type 1 diabetes mellitus with diabetic polyneuropathy ([place]) 12/25/2020   Diabetic neuropathy ([place]) 12/09/2020   Myofascial pain 09/12/2020   Wheelchair dependence 09/12/2020   Dyslipidemia 06/19/2020   Spondylosis without myelopathy or radiculopathy, lumbar region 04/27/2020   Sensorineural hearing loss (SNHL) of both ears 08/21/2019   Tinnitus, bilateral 06/27/2019   FH: ovarian cancer in first degree relative 11/16/2018   Hypercholesteremia 11/15/2018   Gait abnormality 11/07/2018   Pain of  left hip joint 05/18/2018   Chronic midline low back pain with bilateral sciatica 05/18/2018   Breast calcifications 12/28/2017   Fibromyalgia 12/06/2017   History of gastric bypass 12/06/2017   Spinal stenosis 12/06/2017   Asthma 12/06/2017   Cerebellar ataxia ([place])    Type I diabetes mellitus with complication, uncontrolled    Serum albumin decreased 11/10/2017   Vitamin B6 deficiency 11/10/2017   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   Hypoglycemia due to type 1 diabetes mellitus ([place]) 06/20/2016   Hyperparathyroidism ([place]) 10/29/2014   Increased PTH level 10/01/2014   Obstructive sleep apnea on CPAP 10/01/2014   Non-organic enuresis 05/28/2014   Urge incontinence of urine 05/28/2014   History of vaginal hysterectomy 05/24/2014   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   Pain of lumbar facet joint 04/11/2013   Mild persistent asthma without complication 62/13/0865   Carpal tunnel syndrome 78/46/9629   Lichen planus 52/84/1324   Adjustment disorder with mixed anxiety and depressed mood 05/11/2012   Degeneration of lumbar intervertebral disc 04/26/2012   Pain in joint involving ankle and foot 04/26/2012   Osteoarthritis of hand 05/24/2011   Leukopenia 09/25/2009    ONSET DATE: 07/22/21 (referral date)  REFERRING DIAG:  R26.89 (ICD-10-CM) - Other abnormalities of gait and mobility R29.818 (ICD-10-CM) - Other symptoms and signs involving the nervous  system   THERAPY DIAG:  Lateral epicondylitis, left elbow  Lateral epicondylitis of right elbow  Ataxia  Other lack of coordination  SUBJECTIVE:   SUBJECTIVE STATEMENT: Pt reports the orthosis feels a lot better after the adjustments completed last time  PAIN: Are you having pain? Yes: NPRS scale: 3/10 Pain location: slightly distal to L elbow Pain description: neuropathic pain Aggravating factors: movement, specific positions (supination, reaching to the left) Relieving factors: pain patches, warm  pack  PERTINENT HISTORY: Cerebellar atrophy w/ ataxia and chronic low back pain s/p radiofrequency ablation 3/22; myofascial pain and fibromyalgia; bilateral epicondylitis elbow (symptoms started in April 2023)  PMH includes T1DM w/ diabetic neuropathy, asthma, OSA on CPAP, and hx of HTN, PE, foot fracture in 2022, medial epicondylectomy (approx '98), bilateral carpal tunnel release  PRECAUTIONS: Fall; carries glucose tablets and follows the "Rule of 15" for T1DM, 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  PATIENT GOALS Stretches for tennis elbow; increase dexterity   OBJECTIVE:   ADLs: Overall ADLs: Reports Mod I w/ all BADLs Transfers/ambulation related to ADLs: w/c for in-home mobility; RW for transfers in/out of bathroom Equipment: Transfer tub bench, Grab bars, Reacher, and elevated toilet seat  IADLs: Shopping: w/c mobility to and from grocery; lives very close to a [employer] housekeeping: Able to complete light tasks; assist for heavier housekeeping Meal Prep: Microwave meal prep, simple snack prep Community mobility: Uses [employer] [place] paratransit services; has not driven in > 10 years Medication management: Independent Financial management: Independent Handwriting:  Unable to be assessed at evaluation; to be further assessed prn   TODAY'S TREATMENT - 09/09/21: Ultrasound treatment applied to L anterior forearm for 8 min to address pain relief and promote blood flow for tissue healing; pt verbally denied contraindications or precautions. No adverse reaction observed or reported after application; skin intact Parameters: 3.3 MHz, 20% duty cycle, and intensity of 1.0 W/cm2  Active stretches per bilateral epicondylitis protocol: wrist flexion stretch w/ LUE adducted, elbow flexed to 90 and forearm in neutral; able to complete x3, holding end range about 5 sec w/out difficulty wrist flexion w/ LUE adducted, elbow flexed to 90 and forearm pronated; able to complete x3, holding end  range w/out difficulty wrist flexion w/ LUE flexed to chest height, elbow extended, and forearm in neutral; increased pain and exercise was d/c w/ OT providing corresponding education prn Introduced light soft tissue massage (clockwise, counterclockwise, laterally, and longitudinally) and benefit of applying moist heat/warm heating pad to lateral and anterior forearm prior to completing stretches; pt return demonstration and verbalized understanding Picking up small pony beads, translating palm to fingertips and then threading onto string w/ each hand to facilitate [place], precision pinch, and in-hand manipulation; pt able to complete 24 beads w/ mod drops   PATIENT EDUCATION: Ongoing condition-specific education, continued HEP for lateral epicondylitis and encouraged engagement as long as no increase in pain; also discussed POC Person educated: Patient Education method: Explanation and Demonstration Education comprehension: verbalized understanding and returned demonstration   HOME EXERCISE PROGRAM: [email or web address]   GOALS: Goals reviewed with patient? Yes  SHORT TERM GOALS: Target date: 08/28/2021  STG  Status:  1 Pt will verbalize understanding of at least 2 precautions and positional considerations related to worsening of lateral epicondylitis symptoms Baseline: Decreased knowledge of precautions Achieved - 08/05/21  2 Pt will demonstrate understanding of initial HEP designed for increased BUE GMC, ROM, and stretching Baseline: No HEP at this time Achieved - 09/09/21  3 Pt will demonstrate understanding  of at least 1 visual compensatory strategy to reduce functional impact of visual perceptual limitations Baseline: Reports diplopia; no currently compensatory strategies Achieved - 08/26/21    LONG TERM GOALS: Target date: 09/25/2021  LTG  Status:  1 Pt will demonstrate understanding of initial HEP designed for increased BUE GMC, ROM, and stretching Baseline: No HEP at this time Progressing  2  Pt will improve L hand grip strength by at least 9 lbs w/ pain less than 2/10 by discharge Baseline: 35 lbs (RUE 58 lbs) Progressing  3 Pt will increase Box and Blocks score bilaterally by at least 3 blocks to demonstrate improved GMC Baseline: 34 w/ RUE; 26 w/ LUE Progressing  4 Pt will be able to grip and lift a lightweight object (e.g., coffee mug) w/out pain in L elbow Baseline: Pain w/ grasp and lifting Progressing  5 Pt will decrease time to complete 9-HPT by at least 4 seconds to indicate improved control and coordination for functional bilateral FM tasks Baseline: 54.9 sec (RUE 38.5 sec) Progressing     ASSESSMENT:  CLINICAL IMPRESSION: OT continues to progress well toward functional goals and reports improvement in symptoms since incorporation of wrist immobilization and tennis elbow counterforce brace. OT continued to progress w/ protocol for lateral epicondylitis, moving into Phase II w/ focus on active stretching exercises due to ability to complete exercises w/out pain. Pt able to engage in HEP with min cues initially for proper technique and positioning to allow for stretch w/out pain. OT also returned to activity for [facility] w/ pt able to complete activity incorporating higher level FM skill w/ mod drops.  PERFORMANCE DEFICITS in functional skills including ADLs, IADLs, coordination, dexterity, sensation, tone, ROM, strength, pain, FMC, GMC, mobility, balance, body mechanics, decreased knowledge of precautions, decreased knowledge of use of DME, vision, and UE functional use.   IMPAIRMENTS are limiting patient from ADLs, IADLs, rest and sleep, leisure, and social participation.   COMORBIDITIES has co-morbidities such as DM w/ neuropathy and retinopathy, chronic low back pain, asthma, and myofascial pain  that affects occupational performance. Patient will benefit from skilled OT to address above impairments and improve overall function.   PLAN: OT FREQUENCY: 2x/week  OT DURATION:  8 weeks  PLANNED INTERVENTIONS: self care/ADL training, therapeutic exercise, therapeutic activity, neuromuscular re-education, manual therapy, passive range of motion, functional mobility training, aquatic therapy, splinting, ultrasound, iontophoresis, compression bandaging, moist heat, cryotherapy, patient/family education, visual/perceptual remediation/compensation, and DME and/or AE instructions  RECOMMENDED OTHER SERVICES: Currently receiving PT services at this location  CONSULTED AND AGREED WITH PLAN OF CARE: Patient  PLAN FOR NEXT SESSION: Orthosis check w/ adjustments if needed; continue to address [facility]. Review HEP - active stretching.   Kathrine Cords, OTR/L 09/09/2021, 3:19 PM

## 2021-09-09 NOTE — Therapy (Signed)
OUTPATIENT PHYSICAL THERAPY NEURO TREATMENT   Patient Name: Joyce Brown MRN: [id number] DOB:Nov 11, 1950, 71 y.o., female Today's Date: 09/09/2021  PCP: Talbert Cage, MD REFERRING PROVIDER: Marcial Pacas, MD     END OF SESSION:   PT End of Session - 09/09/21 1628     Visit Number 16    Number of Visits 30    Date for PT Re-Evaluation 10/28/21    Authorization Type Medicare/[employer]ARP    Progress Note Due on Visit 14    PT Start Time 1322    PT Stop Time 1400    PT Time Calculation (min) 38 min    Equipment Utilized During Treatment Gait belt    Activity Tolerance Patient tolerated treatment well    Behavior During Therapy WFL for tasks assessed/performed                    Past Medical History:  Diagnosis Date   Cerebellar ataxia ([place])    Diabetes mellitus without complication ([place])    Diabetic neuropathy ([place]) 12/09/2020   Foot fracture 2022   Gait abnormality 11/07/2018   Hyperlipidemia    Pulmonary embolism ([place])    Sleep apnea    Spinal stenosis    Past Surgical History:  Procedure Laterality Date   ABDOMINAL HYSTERECTOMY     ADENOIDECTOMY     ANKLE SURGERY Left    BILATERAL CARPAL TUNNEL RELEASE     BREAST BIOPSY Left 2019   benign   ELBOW SURGERY     GASTRIC BYPASS  2014   HAND NEUROPLASTY     radio denervation  05/21/2020   TONSILLECTOMY     Patient Active Problem List   Diagnosis Date Noted   H/O bariatric surgery 06/29/2021   Bilateral tennis elbow 06/15/2021   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   Type 1 diabetes mellitus with diabetic polyneuropathy ([place]) 12/25/2020   Diabetic neuropathy ([place]) 12/09/2020   Myofascial pain 09/12/2020   Wheelchair dependence 09/12/2020   Dyslipidemia 06/19/2020   Spondylosis without myelopathy or radiculopathy, lumbar region 04/27/2020   Sensorineural hearing loss (SNHL) of both ears 08/21/2019   Tinnitus, bilateral 06/27/2019   FH: ovarian cancer in first degree relative 11/16/2018    Hypercholesteremia 11/15/2018   Gait abnormality 11/07/2018   Pain of left hip joint 05/18/2018   Chronic midline low back pain with bilateral sciatica 05/18/2018   Breast calcifications 12/28/2017   Fibromyalgia 12/06/2017   History of gastric bypass 12/06/2017   Spinal stenosis 12/06/2017   Asthma 12/06/2017   Cerebellar ataxia ([place])    Type I diabetes mellitus with complication, uncontrolled    Serum albumin decreased 11/10/2017   Vitamin B6 deficiency 11/10/2017   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   Hypoglycemia due to type 1 diabetes mellitus ([place]) 06/20/2016   Hyperparathyroidism ([place]) 10/29/2014   Increased PTH level 10/01/2014   Obstructive sleep apnea on CPAP 10/01/2014   Non-organic enuresis 05/28/2014   Urge incontinence of urine 05/28/2014   History of vaginal hysterectomy 05/24/2014   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   Pain of lumbar facet joint 04/11/2013   Mild persistent asthma without complication 41/32/4401   Carpal tunnel syndrome 02/72/5366   Lichen planus 44/05/4740   Adjustment disorder with mixed anxiety and depressed mood 05/11/2012   Degeneration of lumbar intervertebral disc 04/26/2012   Pain in joint involving ankle and foot 04/26/2012   Osteoarthritis of hand 05/24/2011   Leukopenia 09/25/2009    ONSET DATE: 07/06/2021 (MD referral); initial symptoms 2007  REFERRING DIAG: G11.9 (ICD-10-CM) - Cerebellar ataxia ([place]) R26.9 (ICD-10-CM) - Gait abnormality   THERAPY DIAG:  Unsteadiness on feet  Other abnormalities of gait and mobility  Muscle weakness (generalized)  Ataxia  SUBJECTIVE:                                                                                                                                                                                              SUBJECTIVE STATEMENT: Was able to get up the stairs at aquatic therapy.   Pt accompanied by: self  PERTINENT HISTORY: Has Cerebellar atrophy with  ataxia and also chronic low back pain s/p radiofrequency ablation 3/22; Has had trigger point injections for L tennis elbow. Has has Myofascial pain and fibromyalgia.  lumbar spondylosis, chronic LBP  PMH:  DM, neuropathy, closed foot fracture fall of 2022, HLD, PE, spinal stenosis, tennis elbow  PAIN:  PAIN:  Are you having pain? Yes: NPRS scale: 3/10 Pain location: L forearm Pain description: achy Aggravating factors: lifting Relieving factors: brace    PRECAUTIONS: Fall  -hx of Type 1 DM, carries glucose tablets with her; follows the "Rule of 15"  PATIENT GOALS Pt's goals for therapy are to increase strength and mobility.     OBJECTIVE:    TODAY'S TREATMENT: 09/09/21 Activity Comments  L6 x 7 min Ues/LEs Maintaining 50-60 SPM  gait with RW 2x80f Manual cues and verbal cues to encourage continued forward propulsion of walker; mild instability with turning   Step up with B UE support 2x5 Heavy trunk flexion and excessive reliance on UEs  sidestepping along TM rail 4x Cues to relax shoulders and push hips anteriorly       HEP last updated 08/24/21: Access Code: [email or web address]: [email or web address] Date: 08/24/2021 Prepared by: [facility]  Exercises - Seated Gluteal Sets  - 1 x daily - 7 x weekly - 2 sets - 10 reps - 3 sec hold - Seated Active Hip Flexion  - 1 x daily - 7 x weekly - 2 sets - 10 reps - 5 sec hold - Seated Shoulder Row with Anchored Resistance  - 1 x daily - 5 x weekly - 2 sets - 10 reps - Seated Shoulder Extension and Scapular Retraction with Resistance  - 1 x daily - 5 x weekly - 2 sets - 10 reps - Modified Thomas Stretch  - 1 x daily - 5 x weekly - 2 sets - 30 sec hold - Sit to Stand with Counter Support  - 1 x daily - 5 x weekly - 2 sets - 5 reps - Lower  Trunk Rotations  - 1-2 x daily - 7 x weekly - 1 sets - 5-10 reps - Hooklying Isometric Clamshell  - 1 x daily - 5 x weekly - 1-2 sets - 10 reps -  Seated Heel Raise  - 1 x daily - 5 x weekly - 1-2 sets - 10 reps - Standing Hip Abduction with Counter Support  - 1 x daily - 5 x weekly - 2 sets - 10 reps   ________________________________________________________________________  OBJECTIVE: measures were taken at time of initial evaluation unless otherwise specified  MMT:    MMT Right  Left   Hip flexion 5/5 5/5  Hip extension    Hip abduction 4/5 4/5  Hip adduction 4/5 4/5  Hip internal rotation    Hip external rotation    Knee flexion 5/5 5/5  Knee extension 5/5 5/5  Ankle dorsiflexion 3+/5 3+/5  Ankle plantarflexion    Ankle inversion    Ankle eversion    (Blank rows = not tested)  TRANSFERS: Assistive device utilized: Environmental consultant - 2 wheeled  Sit to stand: SBA Stand to sit: SBA Pt stands at [employer] x 1:26, prior to needing to sit.  GAIT: Gait pattern: step to pattern, step through pattern, ataxic, and wide BOS Distance walked: 20 ft Assistive device utilized: Walker - 2 wheeled Level of assistance: CGA and Min A Comments: With TUG test.  Pt reports difficulty with ramp/incline negotiation.  FUNCTIONAL TESTs:  Timed up and go (TUG): 53.34 sec BERG BALANCE: 28/56 (08/19/21)   HOME EXERCISE PROGRAM: MedBridge Access Code: [email or web address] URL: [email or web address] Date: 07/20/2021 Prepared by: [facility]  _______________________________________________________________________________    GOALS: Goals reviewed with patient? Yes  SHORT TERM GOALS: Target date: 08/17/2021  Pt will be independent with HEP for improved strength, standing, transfers, balance. Baseline: Goal status: GOAL MET  2.  Pt perform standing at counter x 3 minutes with UE support for improved standing tolerance. Baseline: 1 min, 26 sec at eval Goal status: GOAL MET    LONG TERM GOALS: Target date: 09/14/2021   Pt will be independent with HEP for improved strength, balance, transfers, and  gait. Baseline: initiated Goal status: ONGOING  2.  Pt will perform at least 5 minutes of standing at counter/RW for improved standing balance and performance with ADLs. Baseline: unable at start of care. 5 min with BUE-unilat support Goal status: MET  3.  Pt will improve TUG score to less than or equal to 35 sec for decreased fall risk. Baseline: 50.34 sec; 53.34 sec>47.53, 43.82 sec 09/02/2021 Goal status: NOT MET, ONGOING  4.  Berg Balance test to be assessed, with score to improve by at least 5 points for decreased fall risk. Baseline: 19/56 07/22/21 at start of care; 28/56 on 08/19/21 Goal status: GOAL MET  5.  Pt will verbalize plans for ongoing community fitness, including aquatic fitness options, upon d/c from PT. Baseline:  Goal status: ONGOING  FOR RECERT 05/27/3886: SHORT TERM GOALS: Target date: 09/30/2021  Pt will be independent with progression of HEP for improved strength, balance, gait and progression to use of apartment complex pool for exercise. Baseline: Goal status: INITIAL  2.  Pt will improve 5x sit<>stand to less with no LOB and mod independently, to demonstrate improved functional strength and transfer efficiency. Baseline: decreased eccentric control; supervision Goal status: INITIAL  LONG TERM GOALS: Target date: 10/28/2021  Pt will be independent with final progression of HEP for improved strength, balance, transfers, and  gait. Baseline:  Goal status: IN PROGRESS  2.  Pt will verbalize understanding of fall prevention in home environment.   Baseline:  Goal Status:  INITIAL  3.  Pt will improve TUG score to less than or equal to 35 sec for decreased fall risk. Baseline:  43.82 sec 09/02/2021 Goal status:  ONGOING  4.  Berg Balance test to improve to at least 33/56 for decreased fall risk. Baseline: 19/56 07/22/21 at start of care; 28/56 on 08/19/21 Goal status: REVISED 5.  Pt will verbalize plans for ongoing community fitness, including aquatic fitness  options, upon d/c from PT. Baseline:  Goal status: ONGOING   CLINICAL IMPRESSION: Patient arrived to session with report of doing well in aquatic therapy last session. Worked on Personnel officer with RW and stair navigation. Patient able to perform longer durations of gait training but still requires cues for further forward propulsion of walker to avoid retropulsion and LOB. Patient performed step up with good muscle control but required frequent cueing to relax shoulder musculature and grip on handrails. Patient demonstrated good effort with session today and without complaints upon leaving.   OBJECTIVE IMPAIRMENTS Abnormal gait, decreased activity tolerance, decreased balance, decreased endurance, decreased mobility, difficulty walking, decreased strength, and postural dysfunction.   ACTIVITY LIMITATIONS community activity and community fitness .   PERSONAL FACTORS  PMH:  Cerebellar atrophy with ataxia, chronic low back pain s/p radiofrequency ablation 3/22; trigger point injections for L tennis elbow; myofascial pain and fibromyalgia.  lumbar spondylosis.  DM, neuropathy, closed foot fracture fall of 2022, HLD, PE, spinal stenosis, are also affecting patient's functional outcome.    REHAB POTENTIAL: Good  CLINICAL DECISION MAKING: Evolving/moderate complexity  EVALUATION COMPLEXITY: Moderate  PLAN: PT FREQUENCY: 2x/week  PT DURATION: 8 weeks  (per recert 2/84/1324)  PLANNED INTERVENTIONS: Therapeutic exercises, Therapeutic activity, Neuromuscular re-education, Balance training, Gait training, Patient/Family education, Joint mobilization, DME instructions, Aquatic Therapy, and Manual therapy  PLAN FOR NEXT SESSION: .  Continue to work on short distance gait, progression of standing exercises, stair negotiation for when pt can use pool at her apartment complex.  Continue to progress lower extremity strength and standing balance. Core stability, posture exercises. Standing exercises at  counter-head turns/nods, lateral or A/P weightshifting, reaching tasks.     Janene Harvey, PT, DPT 09/09/21 4:36 PM  [facility] [address] [place], [place], [place] [address] Phone # [phone] Fax # [phone]

## 2021-09-10 ENCOUNTER — Encounter: Payer: Self-pay | Admitting: Internal Medicine

## 2021-09-10 ENCOUNTER — Other Ambulatory Visit: Payer: Self-pay

## 2021-09-10 MED ORDER — DEXCOM G7 RECEIVER DEVI: 0 refills | Status: DC

## 2021-09-11 ENCOUNTER — Encounter: Payer: Self-pay | Admitting: Occupational Therapy

## 2021-09-11 ENCOUNTER — Ambulatory Visit: Payer: Medicare Other | Admitting: Occupational Therapy

## 2021-09-11 DIAGNOSIS — M6281 Muscle weakness (generalized): Secondary | ICD-10-CM

## 2021-09-11 DIAGNOSIS — M7712 Lateral epicondylitis, left elbow: Secondary | ICD-10-CM | POA: Diagnosis not present

## 2021-09-11 DIAGNOSIS — R270 Ataxia, unspecified: Secondary | ICD-10-CM | POA: Diagnosis not present

## 2021-09-11 DIAGNOSIS — R278 Other lack of coordination: Secondary | ICD-10-CM | POA: Diagnosis not present

## 2021-09-11 DIAGNOSIS — R2681 Unsteadiness on feet: Secondary | ICD-10-CM | POA: Diagnosis not present

## 2021-09-11 DIAGNOSIS — R2689 Other abnormalities of gait and mobility: Secondary | ICD-10-CM | POA: Diagnosis not present

## 2021-09-11 NOTE — Therapy (Signed)
OUTPATIENT OCCUPATIONAL THERAPY TREATMENT NOTE   Patient Name: Joyce Brown MRN: [id number] DOB:[birth year], 71 y.o., female Today's Date: 09/11/2021  PCP: Talbert Cage, MD REFERRING PROVIDER: Marcial Pacas, MD  END OF SESSION:   OT End of Session - 09/11/21 0902     Visit Number 9    Number of Visits 17    Date for OT Re-Evaluation 10/07/21    Authorization Type Medicare    Progress Note Due on Visit 10    OT Start Time 0802    OT Stop Time 0846    OT Time Calculation (min) 44 min    Activity Tolerance Patient tolerated treatment well    Behavior During Therapy [facility] for tasks assessed/performed             Past Medical History:  Diagnosis Date   Cerebellar ataxia ([place])    Diabetes mellitus without complication ([place])    Diabetic neuropathy ([place]) 12/09/2020   Foot fracture 2022   Gait abnormality 11/07/2018   Hyperlipidemia    Pulmonary embolism ([place])    Sleep apnea    Spinal stenosis    Past Surgical History:  Procedure Laterality Date   ABDOMINAL HYSTERECTOMY     ADENOIDECTOMY     ANKLE SURGERY Left    BILATERAL CARPAL TUNNEL RELEASE     BREAST BIOPSY Left 2019   benign   ELBOW SURGERY     GASTRIC BYPASS  2014   HAND NEUROPLASTY     radio denervation  05/21/2020   TONSILLECTOMY     Patient Active Problem List   Diagnosis Date Noted   H/O bariatric surgery 06/29/2021   Bilateral tennis elbow 06/15/2021   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   Type 1 diabetes mellitus with diabetic polyneuropathy ([place]) 12/25/2020   Diabetic neuropathy ([place]) 12/09/2020   Myofascial pain 09/12/2020   Wheelchair dependence 09/12/2020   Dyslipidemia 06/19/2020   Spondylosis without myelopathy or radiculopathy, lumbar region 04/27/2020   Sensorineural hearing loss (SNHL) of both ears 08/21/2019   Tinnitus, bilateral 06/27/2019   FH: ovarian cancer in first degree relative 11/16/2018   Hypercholesteremia 11/15/2018   Gait abnormality 11/07/2018   Pain  of left hip joint 05/18/2018   Chronic midline low back pain with bilateral sciatica 05/18/2018   Breast calcifications 12/28/2017   Fibromyalgia 12/06/2017   History of gastric bypass 12/06/2017   Spinal stenosis 12/06/2017   Asthma 12/06/2017   Cerebellar ataxia ([place])    Type I diabetes mellitus with complication, uncontrolled    Serum albumin decreased 11/10/2017   Vitamin B6 deficiency 11/10/2017   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   Hypoglycemia due to type 1 diabetes mellitus ([place]) 06/20/2016   Hyperparathyroidism ([place]) 10/29/2014   Increased PTH level 10/01/2014   Obstructive sleep apnea on CPAP 10/01/2014   Non-organic enuresis 05/28/2014   Urge incontinence of urine 05/28/2014   History of vaginal hysterectomy 05/24/2014   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   Pain of lumbar facet joint 04/11/2013   Mild persistent asthma without complication 46/56/8127   Carpal tunnel syndrome 51/70/0174   Lichen planus 94/49/6759   Adjustment disorder with mixed anxiety and depressed mood 05/11/2012   Degeneration of lumbar intervertebral disc 04/26/2012   Pain in joint involving ankle and foot 04/26/2012   Osteoarthritis of hand 05/24/2011   Leukopenia 09/25/2009    ONSET DATE: 07/22/21 (referral date)  REFERRING DIAG:  R26.89 (ICD-10-CM) - Other abnormalities of gait and mobility R29.818 (ICD-10-CM) - Other symptoms and signs involving the  nervous system   THERAPY DIAG:  Lateral epicondylitis, left elbow  Other lack of coordination  Muscle weakness (generalized)  SUBJECTIVE:   SUBJECTIVE STATEMENT: Pt reports pain is down to about a 3 and that certain movements still aggravate it, but not as much as before.  PAIN: Are you having pain? Yes: NPRS scale: 3/10 Pain location: slightly distal to L elbow Pain description: neuropathic pain Aggravating factors: movement, specific positions (supination, reaching to the left) Relieving factors: pain patches,  warm pack  PERTINENT HISTORY: Cerebellar atrophy w/ ataxia and chronic low back pain s/p radiofrequency ablation 3/22; myofascial pain and fibromyalgia; bilateral epicondylitis elbow (symptoms started in April 2023)  PMH includes T1DM w/ diabetic neuropathy, asthma, OSA on CPAP, and hx of HTN, PE, foot fracture in 2022, medial epicondylectomy (approx '98), bilateral carpal tunnel release  PRECAUTIONS: Fall; carries glucose tablets and follows the "Rule of 15" for T1DM, 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  PATIENT GOALS Stretches for tennis elbow; increase dexterity   OBJECTIVE:   ADLs: Overall ADLs: Reports Mod I w/ all BADLs Transfers/ambulation related to ADLs: w/c for in-home mobility; RW for transfers in/out of bathroom Equipment: Transfer tub bench, Grab bars, Reacher, and elevated toilet seat  IADLs: Shopping: w/c mobility to and from grocery; lives very close to a [employer] [employer] housekeeping: Able to complete light tasks; assist for heavier housekeeping Meal Prep: Microwave meal prep, simple snack prep Community mobility: Uses [employer] [place] paratransit services; has not driven in > 10 years Medication management: Independent Financial management: Independent Handwriting:  Unable to be assessed at evaluation; to be further assessed prn   TODAY'S TREATMENT - 09/11/21: Ultrasound treatment applied to L anterior forearm for 8 min to address pain relief and promote blood flow for tissue healing; pt verbally denied contraindications or precautions. No adverse reaction observed or reported after application; skin intact Parameters: 3.3 MHz, 20% duty cycle, and intensity of 1.0 W/cm2  Engaged in small peg board pattern replication with focus on use of LUE to facilitate [place], precision pinch, and in-hand manipulation.  Pt demonstrating good use of tip to pinch to pick up small pegs, intermittently requiring increased time.  Progressed to in-hand manipulation and translation with pt  demonstrating increased difficulty, dropping 60% of pegs -both when picking up and when translating to finger tips.  Pt with decreased dropping of pegs when removing from peg board and placing one by one and with translation into larger container.   Reviewed light soft tissue massage (clockwise, counterclockwise, laterally, and longitudinally).  Pt reports applying heat prior to massage with noted improvements.   PATIENT EDUCATION: Ongoing condition-specific education, continued HEP for lateral epicondylitis and encouraged engagement as long as no increase in pain; also discussed POC Person educated: Patient Education method: Explanation and Demonstration Education comprehension: verbalized understanding and returned demonstration   HOME EXERCISE PROGRAM: [email or web address]   GOALS: Goals reviewed with patient? Yes  SHORT TERM GOALS: Target date: 08/28/2021  STG  Status:  1 Pt will verbalize understanding of at least 2 precautions and positional considerations related to worsening of lateral epicondylitis symptoms Baseline: Decreased knowledge of precautions Achieved - 08/05/21  2 Pt will demonstrate understanding of initial HEP designed for increased BUE GMC, ROM, and stretching Baseline: No HEP at this time Achieved - 09/09/21  3 Pt will demonstrate understanding of at least 1 visual compensatory strategy to reduce functional impact of visual perceptual limitations Baseline: Reports diplopia; no currently compensatory strategies Achieved - 08/26/21    LONG TERM  GOALS: Target date: 09/25/2021  LTG  Status:  1 Pt will demonstrate understanding of initial HEP designed for increased BUE GMC, ROM, and stretching Baseline: No HEP at this time Progressing  2 Pt will improve L hand grip strength by at least 9 lbs w/ pain less than 2/10 by discharge Baseline: 35 lbs (RUE 58 lbs) Progressing  3 Pt will increase Box and Blocks score bilaterally by at least 3 blocks to demonstrate improved GMC Baseline:  34 w/ RUE; 26 w/ LUE Progressing  4 Pt will be able to grip and lift a lightweight object (e.g., coffee mug) w/out pain in L elbow Baseline: Pain w/ grasp and lifting Progressing  5 Pt will decrease time to complete 9-HPT by at least 4 seconds to indicate improved control and coordination for functional bilateral FM tasks Baseline: 54.9 sec (RUE 38.5 sec) Progressing     ASSESSMENT:  CLINICAL IMPRESSION: Pt continues to report improvements in symptoms since incorporation of wrist immobilization and tennis elbow counterforce brace. Pt tolerated ultrasound treatment and even reports relief in pain with ultrasound and massage.  Pt demonstrating improved functional use of LUE during structured and functional tasks.  Pt engaging in [facility] tasks with improvements in coordination, minimal drops with in-hand manipulation and translation.  Pt continues to benefit from OT services to address lateral epicondylitis with exercises, massage and ultrasound, and therapeutic activities for fine andg gross motor tasks.  PERFORMANCE DEFICITS in functional skills including ADLs, IADLs, coordination, dexterity, sensation, tone, ROM, strength, pain, FMC, GMC, mobility, balance, body mechanics, decreased knowledge of precautions, decreased knowledge of use of DME, vision, and UE functional use.   IMPAIRMENTS are limiting patient from ADLs, IADLs, rest and sleep, leisure, and social participation.   COMORBIDITIES has co-morbidities such as DM w/ neuropathy and retinopathy, chronic low back pain, asthma, and myofascial pain  that affects occupational performance. Patient will benefit from skilled OT to address above impairments and improve overall function.   PLAN: OT FREQUENCY: 2x/week  OT DURATION: 8 weeks  PLANNED INTERVENTIONS: self care/ADL training, therapeutic exercise, therapeutic activity, neuromuscular re-education, manual therapy, passive range of motion, functional mobility training, aquatic therapy,  splinting, ultrasound, iontophoresis, compression bandaging, moist heat, cryotherapy, patient/family education, visual/perceptual remediation/compensation, and DME and/or AE instructions  RECOMMENDED OTHER SERVICES: Currently receiving PT services at this location  CONSULTED AND AGREED WITH PLAN OF CARE: Patient  PLAN FOR NEXT SESSION: Orthosis check w/ adjustments if needed; continue to address [facility]. Review HEP - active stretching.   Simonne Come, OTR/L 09/11/2021, 9:03 AM

## 2021-09-14 ENCOUNTER — Ambulatory Visit: Payer: Medicare Other | Admitting: Physical Therapy

## 2021-09-14 NOTE — Progress Notes (Signed)
  Subjective:  Patient ID: Joyce Brown, female    DOB: 10/28/1950,  MRN: [id number]  Joyce Brown presents to clinic today for at risk foot care with history of diabetic neuropathy and painful elongated mycotic toenails 1-5 bilaterally which are tender when wearing enclosed shoe gear. Pain is relieved with periodic professional debridement.  Patient states blood glucose was 241 mg/dl today.  Last A1c was 6.1%.  New problem(s): None.   PCP is Lind Covert, MD , and last visit was August 18, 2021.  No Known Allergies  Review of Systems: Negative except as noted in the HPI.  Objective: No changes noted in today's physical examination.  Constitutional Joyce Brown is a pleasant 71 y.o. Caucasian female, WD, WN in NAD. AAO x 3.   Vascular CFT immediate b/l LE. Palpable DP/PT pulses b/l LE. Digital hair present b/l. Skin temperature gradient WNL b/l. No pain with calf compression b/l. No edema noted b/l. No cyanosis or clubbing noted b/l LE.  Neurologic Normal speech. Oriented to person, place, and time. Protective sensation intact 5/5 intact bilaterally with 10g monofilament b/l. Vibratory sensation intact b/l.  Dermatologic Pedal skin warm and supple b/l.  No open wounds b/l. No interdigital macerations. Toenails 1-5 b/l elongated, thickened, discolored with subungual debris. +Tenderness with dorsal palpation of nailplates.No hyperkeratotic nor porokeratotic lesions noted b/l.  Orthopedic: Muscle strength 5/5 to all lower extremity muscle groups bilaterally. No pain, crepitus or joint limitation noted with ROM bilateral LE. Hammertoe deformity noted 2-5 b/l. Utilizes motorized chair for mobility assistance.   Radiographs: None    Latest Ref Rng & Units 06/26/2021    9:55 AM 12/25/2020    9:57 AM  Hemoglobin A1C  Hemoglobin-A1c 4.0 - 5.6 % 6.1  6.3    Assessment/Plan: 1. Pain due to onychomycosis of toenails of both feet   2. Diabetic polyneuropathy associated  with type 1 diabetes mellitus ([place])   -Patient was evaluated and treated. All patient's and/or POA's questions/concerns answered on today's visit. -Patient to continue soft, supportive shoe gear daily. -Mycotic toenails 1-5 bilaterally were debrided in length and girth with sterile nail nippers and dremel without incident. -Patient/POA to call should there be question/concern in the interim.   Return in about 3 months (around 12/08/2021).  Marzetta Board, DPM

## 2021-09-16 ENCOUNTER — Ambulatory Visit: Payer: Medicare Other | Attending: Neurology | Admitting: Occupational Therapy

## 2021-09-16 ENCOUNTER — Encounter: Payer: Self-pay | Admitting: Occupational Therapy

## 2021-09-16 DIAGNOSIS — M7712 Lateral epicondylitis, left elbow: Secondary | ICD-10-CM | POA: Diagnosis not present

## 2021-09-16 DIAGNOSIS — R2689 Other abnormalities of gait and mobility: Secondary | ICD-10-CM | POA: Insufficient documentation

## 2021-09-16 DIAGNOSIS — R270 Ataxia, unspecified: Secondary | ICD-10-CM | POA: Diagnosis not present

## 2021-09-16 DIAGNOSIS — R278 Other lack of coordination: Secondary | ICD-10-CM | POA: Insufficient documentation

## 2021-09-16 DIAGNOSIS — M7711 Lateral epicondylitis, right elbow: Secondary | ICD-10-CM | POA: Diagnosis not present

## 2021-09-16 DIAGNOSIS — R2681 Unsteadiness on feet: Secondary | ICD-10-CM | POA: Insufficient documentation

## 2021-09-16 DIAGNOSIS — M6281 Muscle weakness (generalized): Secondary | ICD-10-CM | POA: Diagnosis not present

## 2021-09-16 NOTE — Therapy (Signed)
OUTPATIENT OCCUPATIONAL THERAPY TREATMENT & PROGRESS NOTE   Patient Name: Joyce Brown MRN: [id number] DOB:[birth year], 71 y.o., female Today's Date: 09/16/2021  PCP: Talbert Cage, MD REFERRING PROVIDER: Marcial Pacas, MD  END OF SESSION:   OT End of Session - 09/16/21 4098     Visit Number 10    Number of Visits 17    Date for OT Re-Evaluation 10/07/21    Authorization Type Medicare    Progress Note Due on Visit 10    OT Start Time 0847    OT Stop Time 0930    OT Time Calculation (min) 43 min    Activity Tolerance Patient tolerated treatment well    Behavior During Therapy [facility] for tasks assessed/performed            Occupational Therapy Progress Note  Dates of Reporting Period: 07/29/21 to 09/16/21  Objective Reports of Subjective Statement: Pain level in region of posterior forearm consistently 3/10 compared to 5/10 initially  Objective Measurements: Box and Blocks, grip strength via dynamometer, 9-Hole Peg Test, levels of independence  Goal Update: 3/3 STGs and 1/5 LTGs met; progressing toward 4 remaining LTGs  Plan: Continue to address ataxia and pain management/progression of HEP for suspected lateral epicondylitis symptoms  Reason Skilled Services are Required: Pt continues to experience pain and additional symptoms around her posterior forearm, just distal to the elbow, which is limiting to participation in functional activities (LUE > RUE). Pt will benefit from continued skilled service to progress w/ protocol including active stretches, passive range, and ultimately lightly strengthening under therapist guidance, which also addressing overall strength and coordination for improved safety during functional activities at home.  Past Medical History:  Diagnosis Date   Cerebellar ataxia ([place])    Diabetes mellitus without complication ([place])    Diabetic neuropathy ([place]) 12/09/2020   Foot fracture 2022   Gait abnormality 11/07/2018   Hyperlipidemia     Pulmonary embolism ([place])    Sleep apnea    Spinal stenosis    Past Surgical History:  Procedure Laterality Date   ABDOMINAL HYSTERECTOMY     ADENOIDECTOMY     ANKLE SURGERY Left    BILATERAL CARPAL TUNNEL RELEASE     BREAST BIOPSY Left 2019   benign   ELBOW SURGERY     GASTRIC BYPASS  2014   HAND NEUROPLASTY     radio denervation  05/21/2020   TONSILLECTOMY     Patient Active Problem List   Diagnosis Date Noted   H/O bariatric surgery 06/29/2021   Bilateral tennis elbow 06/15/2021   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   Type 1 diabetes mellitus with diabetic polyneuropathy ([place]) 12/25/2020   Diabetic neuropathy ([place]) 12/09/2020   Myofascial pain 09/12/2020   Wheelchair dependence 09/12/2020   Dyslipidemia 06/19/2020   Spondylosis without myelopathy or radiculopathy, lumbar region 04/27/2020   Sensorineural hearing loss (SNHL) of both ears 08/21/2019   Tinnitus, bilateral 06/27/2019   FH: ovarian cancer in first degree relative 11/16/2018   Hypercholesteremia 11/15/2018   Gait abnormality 11/07/2018   Pain of left hip joint 05/18/2018   Chronic midline low back pain with bilateral sciatica 05/18/2018   Breast calcifications 12/28/2017   Fibromyalgia 12/06/2017   History of gastric bypass 12/06/2017   Spinal stenosis 12/06/2017   Asthma 12/06/2017   Cerebellar ataxia ([place])    Type I diabetes mellitus with complication, uncontrolled    Serum albumin decreased 11/10/2017   Vitamin B6 deficiency 11/10/2017   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  Hypoglycemia due to type 1 diabetes mellitus ([place]) 06/20/2016   Hyperparathyroidism ([place]) 10/29/2014   Increased PTH level 10/01/2014   Obstructive sleep apnea on CPAP 10/01/2014   Non-organic enuresis 05/28/2014   Urge incontinence of urine 05/28/2014   History of vaginal hysterectomy 05/24/2014   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   Pain of lumbar facet joint 04/11/2013   Mild persistent asthma  without complication 38/12/1749   Carpal tunnel syndrome 02/58/5277   Lichen planus 82/42/3536   Adjustment disorder with mixed anxiety and depressed mood 05/11/2012   Degeneration of lumbar intervertebral disc 04/26/2012   Pain in joint involving ankle and foot 04/26/2012   Osteoarthritis of hand 05/24/2011   Leukopenia 09/25/2009    ONSET DATE: 07/22/21 (referral date)  REFERRING DIAG:  R26.89 (ICD-10-CM) - Other abnormalities of gait and mobility R29.818 (ICD-10-CM) - Other symptoms and signs involving the nervous system   THERAPY DIAG:  Lateral epicondylitis, left elbow  Lateral epicondylitis of right elbow  Ataxia  Other lack of coordination  SUBJECTIVE:   SUBJECTIVE STATEMENT: "I've started to have periods of the day where I'm not having any pain;" pt reports it was "constant" before  PAIN: Are you having pain? Yes: NPRS scale: 3/10 Pain location: slightly distal to L elbow Pain description: neuropathic pain Aggravating factors: movement, specific positions (supination, reaching to the left) Relieving factors: pain patches, warm pack  PERTINENT HISTORY: Cerebellar atrophy w/ ataxia and chronic low back pain s/p radiofrequency ablation 3/22; myofascial pain and fibromyalgia; bilateral epicondylitis elbow (symptoms started in April 2023)  PMH includes T1DM w/ diabetic neuropathy, asthma, OSA on CPAP, and hx of HTN, PE, foot fracture in 2022, medial epicondylectomy (approx '98), bilateral carpal tunnel release  PRECAUTIONS: Fall; carries glucose tablets and follows the "Rule of 15" for T1DM, 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 Pt positioned w/ forearm pronated and resting on tabletop; tolerated intervention w/ mild  discomfort only around region of origin of extensor muscles just distal to the elbow Reviewed active stretches per bilateral epicondylitis protocol w/ corresponding handout provided; pt completed 3 reps of 3rd exercise (wrist flexion w/ LUE at chest height, elbow extended, and forearm in neutral) but was unable to tolerate remaining 2 repetitions. OT instructed pt to complete 2nd exercise at home and then progress to 3rd exercise when able to tolerate 5 reps of preceding exercise w/out difficulty; pt verbalized understanding   PATIENT EDUCATION: Ongoing condition-specific education and discussion regarding POC: particularly related to continued pain management, wear/care of orthosis (slowly decreasing wear time), progress toward goals w/ interpretation of changes, and continued progression of lateral epicondylitis protocol as able Person educated: Patient Education method: Customer service manager Education comprehension: verbalized understanding   HOME EXERCISE PROGRAM: - MedBridge Code: [email or web address] - Active stretches per [place] [facility] protocol for lateral epicondylitis (printed handout administered)   GOALS: Goals reviewed with patient? Yes  SHORT TERM GOALS: Target date: 08/28/2021  STG  Status:  1 Pt will verbalize understanding of at least 2 precautions and positional considerations related to worsening of lateral epicondylitis symptoms Baseline: Decreased knowledge of precautions Achieved - 08/05/21  2 Pt will demonstrate understanding of initial HEP designed for increased BUE GMC, ROM, and stretching Baseline: No HEP at this time Achieved - 09/09/21  3 Pt will demonstrate  understanding of at least 1 visual compensatory strategy to reduce functional impact of visual perceptual limitations Baseline: Reports diplopia; no currently compensatory strategies Achieved - 08/26/21    LONG TERM GOALS: Target date: 09/25/2021  LTG  Status:  1 Pt will demonstrate understanding of initial HEP  designed for increased BUE GMC, ROM, and stretching Baseline: No HEP at this time Progressing  2 Pt will improve L hand grip strength by at least 9 lbs w/ pain less than 2/10 by discharge Baseline: 35 lbs (RUE 58 lbs) Progressing - 09/16/21: 42 lbs  3 Pt will increase Box and Blocks score bilaterally by at least 3 blocks to demonstrate improved GMC Baseline: 34 w/ RUE; 26 w/ LUE Progressing - 09/16/21: 33 w/ RUE, 28 w/ LUE   4 Pt will be able to grip and lift a lightweight object (e.g., coffee mug) w/out pain in L elbow Baseline: Pain w/ grasp and lifting Progressing - continues to report pain  5 Pt will decrease time to complete 9-HPT by at least 4 seconds to indicate improved control and coordination for functional bilateral FM tasks Baseline: 54.9 sec (RUE 38.5 sec) Met - 09/16/21: 45.5 sec w/ RUE     ASSESSMENT:  CLINICAL IMPRESSION: Pt continues to endorse improvement in symptoms of lateral epicondylitis w/ incorporation of massage, moist heat, ultrasound tx in therapy, and continued wrist immobilization w/ elbow counterforce brace. OT reassessed progress toward goals this session w/ pt demonstrating 7 lb improvement in LUE grip strength, similar results of Box and Blocks test indicating no significant change in unilateral GMC, and improved 9-HPT time by 9 sec w/ LUE indicating increased FMC. OT reviewed active stretches introduced last week w/ pt able to return demonstration. Pt continues to benefit from OT services to address lateral epicondylitis with exercises, massage and ultrasound, and therapeutic activities for gross motor tasks.  PERFORMANCE DEFICITS in functional skills including ADLs, IADLs, coordination, dexterity, sensation, tone, ROM, strength, pain, FMC, GMC, mobility, balance, body mechanics, decreased knowledge of precautions, decreased knowledge of use of DME, vision, and UE functional use.   IMPAIRMENTS are limiting patient from ADLs, IADLs, rest and sleep, leisure, and social  participation.   COMORBIDITIES has co-morbidities such as DM w/ neuropathy and retinopathy, chronic low back pain, asthma, and myofascial pain  that affects occupational performance. Patient will benefit from skilled OT to address above impairments and improve overall function.   PLAN: OT FREQUENCY: 2x/week  OT DURATION: 8 weeks  PLANNED INTERVENTIONS: self care/ADL training, therapeutic exercise, therapeutic activity, neuromuscular re-education, manual therapy, passive range of motion, functional mobility training, aquatic therapy, splinting, ultrasound, iontophoresis, compression bandaging, moist heat, cryotherapy, patient/family education, visual/perceptual remediation/compensation, and DME and/or AE instructions  RECOMMENDED OTHER SERVICES: Currently receiving PT services at this location  CONSULTED AND AGREED WITH PLAN OF CARE: Patient  PLAN FOR NEXT SESSION: Continue to address [place] [facility]; progress protocol as able related to symptoms   Kathrine Cords, OTR/L 09/16/2021, 12:30 PM

## 2021-09-21 ENCOUNTER — Ambulatory Visit: Payer: Medicare Other | Admitting: Physical Therapy

## 2021-09-21 ENCOUNTER — Encounter: Payer: Self-pay | Admitting: Physical Therapy

## 2021-09-21 DIAGNOSIS — M6281 Muscle weakness (generalized): Secondary | ICD-10-CM | POA: Diagnosis not present

## 2021-09-21 DIAGNOSIS — M7711 Lateral epicondylitis, right elbow: Secondary | ICD-10-CM | POA: Diagnosis not present

## 2021-09-21 DIAGNOSIS — R2681 Unsteadiness on feet: Secondary | ICD-10-CM | POA: Diagnosis not present

## 2021-09-21 DIAGNOSIS — R278 Other lack of coordination: Secondary | ICD-10-CM | POA: Diagnosis not present

## 2021-09-21 DIAGNOSIS — R270 Ataxia, unspecified: Secondary | ICD-10-CM | POA: Diagnosis not present

## 2021-09-21 DIAGNOSIS — M7712 Lateral epicondylitis, left elbow: Secondary | ICD-10-CM | POA: Diagnosis not present

## 2021-09-21 DIAGNOSIS — R2689 Other abnormalities of gait and mobility: Secondary | ICD-10-CM

## 2021-09-21 NOTE — Therapy (Signed)
OUTPATIENT PHYSICAL THERAPY NEURO TREATMENT   Patient Name: Joyce Brown MRN: [id number] DOB:[birth year], 71 y.o., female Today's Date: 09/21/2021  PCP: Talbert Cage, MD REFERRING PROVIDER: Marcial Pacas, MD     END OF SESSION:   PT End of Session - 09/21/21 0734     Visit Number 17    Number of Visits 30    Date for PT Re-Evaluation 10/28/21    Authorization Type Medicare/AARP    Progress Note Due on Visit 80    PT Start Time 0803    PT Stop Time 0843    PT Time Calculation (min) 40 min    Equipment Utilized During Treatment Gait belt    Activity Tolerance Patient tolerated treatment well    Behavior During Therapy WFL for tasks assessed/performed                    Past Medical History:  Diagnosis Date   Cerebellar ataxia ([place])    Diabetes mellitus without complication ([place])    Diabetic neuropathy ([place]) 12/09/2020   Foot fracture 2022   Gait abnormality 11/07/2018   Hyperlipidemia    Pulmonary embolism ([place])    Sleep apnea    Spinal stenosis    Past Surgical History:  Procedure Laterality Date   ABDOMINAL HYSTERECTOMY     ADENOIDECTOMY     ANKLE SURGERY Left    BILATERAL CARPAL TUNNEL RELEASE     BREAST BIOPSY Left 2019   benign   ELBOW SURGERY     GASTRIC BYPASS  2014   HAND NEUROPLASTY     radio denervation  05/21/2020   TONSILLECTOMY     Patient Active Problem List   Diagnosis Date Noted   H/O bariatric surgery 06/29/2021   Bilateral tennis elbow 06/15/2021   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   Type 1 diabetes mellitus with diabetic polyneuropathy ([place]) 12/25/2020   Diabetic neuropathy ([place]) 12/09/2020   Myofascial pain 09/12/2020   Wheelchair dependence 09/12/2020   Dyslipidemia 06/19/2020   Spondylosis without myelopathy or radiculopathy, lumbar region 04/27/2020   Sensorineural hearing loss (SNHL) of both ears 08/21/2019   Tinnitus, bilateral 06/27/2019   FH: ovarian cancer in first degree relative 11/16/2018    Hypercholesteremia 11/15/2018   Gait abnormality 11/07/2018   Pain of left hip joint 05/18/2018   Chronic midline low back pain with bilateral sciatica 05/18/2018   Breast calcifications 12/28/2017   Fibromyalgia 12/06/2017   History of gastric bypass 12/06/2017   Spinal stenosis 12/06/2017   Asthma 12/06/2017   Cerebellar ataxia ([place])    Type I diabetes mellitus with complication, uncontrolled    Serum albumin decreased 11/10/2017   Vitamin B6 deficiency 11/10/2017   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   Hypoglycemia due to type 1 diabetes mellitus ([place]) 06/20/2016   Hyperparathyroidism ([place]) 10/29/2014   Increased PTH level 10/01/2014   Obstructive sleep apnea on CPAP 10/01/2014   Non-organic enuresis 05/28/2014   Urge incontinence of urine 05/28/2014   History of vaginal hysterectomy 05/24/2014   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   Pain of lumbar facet joint 04/11/2013   Mild persistent asthma without complication 56/31/4970   Carpal tunnel syndrome 26/37/8588   Lichen planus 50/27/7412   Adjustment disorder with mixed anxiety and depressed mood 05/11/2012   Degeneration of lumbar intervertebral disc 04/26/2012   Pain in joint involving ankle and foot 04/26/2012   Osteoarthritis of hand 05/24/2011   Leukopenia 09/25/2009    ONSET DATE: 07/06/2021 (MD referral); initial symptoms 2007  REFERRING DIAG: G11.9 (ICD-10-CM) - Cerebellar ataxia ([place]) R26.9 (ICD-10-CM) - Gait abnormality   THERAPY DIAG:  Unsteadiness on feet  Muscle weakness (generalized)  Other abnormalities of gait and mobility  SUBJECTIVE:                                                                                                                                                                                              SUBJECTIVE STATEMENT: Didn't do the pool last week due to the therapist being out, but I did do my exercises.  Feel a lot more steady when I'm standing now.  Pt  accompanied by: self  PERTINENT HISTORY: Has Cerebellar atrophy with ataxia and also chronic low back pain s/p radiofrequency ablation 3/22; Has had trigger point injections for L tennis elbow. Has has Myofascial pain and fibromyalgia.  lumbar spondylosis, chronic LBP  PMH:  DM, neuropathy, closed foot fracture fall of 2022, HLD, PE, spinal stenosis, tennis elbow  PAIN:  PAIN:  Are you having pain? Yes: NPRS scale: 2/10 Pain location: L forearm Pain description: sore, achy Aggravating factors: reaching back Relieving factors: splint is helping /OT helping  PRECAUTIONS: Fall  -hx of Type 1 DM, carries glucose tablets with her; follows the "Rule of 15"  PATIENT GOALS Pt's goals for therapy are to increase strength and mobility.     OBJECTIVE:   TODAY'S TREATMENT: 09/21/2021 Activity Comments  Sit to stand, 2 x 5 reps from mat surface; additional 5 reps of sit<>stand throughout session, in parallel bars from chair. Cues for control, improves with repetition  Squat pivot transfer w/c<>Nustep seat, CGA   In parallel bars: Alt step taps to 4" step, x 10 reps, 2 sets Standing with head turns/nods x 5 reps UE support, 2 sets  Step ups to 4" step, 2 x 5 reps BUE support Cues for posture reset between exercises  Gait 25 ft with RW   Standing on 4" step:  forward step taps x 10 reps, BUE support in parallel bars   NuStep, L6 x 7 min 4 extremities for strengthening, flexibility Cues for steps/min 50-60          HEP last updated 08/24/21: Access Code: [email or web address] URL: [email or web address] Date: 08/24/2021 Prepared by: [facility]  Exercises - Seated Gluteal Sets  - 1 x daily - 7 x weekly - 2 sets - 10 reps - 3 sec hold - Seated Active Hip Flexion  - 1 x daily - 7 x weekly - 2 sets - 10 reps - 5 sec hold - Seated Shoulder Row with Anchored Resistance  -  1 x daily - 5 x weekly - 2 sets - 10 reps - Seated Shoulder Extension and Scapular  Retraction with Resistance  - 1 x daily - 5 x weekly - 2 sets - 10 reps - Modified Thomas Stretch  - 1 x daily - 5 x weekly - 2 sets - 30 sec hold - Sit to Stand with Counter Support  - 1 x daily - 5 x weekly - 2 sets - 5 reps - Lower Trunk Rotations  - 1-2 x daily - 7 x weekly - 1 sets - 5-10 reps - Hooklying Isometric Clamshell  - 1 x daily - 5 x weekly - 1-2 sets - 10 reps - Seated Heel Raise  - 1 x daily - 5 x weekly - 1-2 sets - 10 reps - Standing Hip Abduction with Counter Support  - 1 x daily - 5 x weekly - 2 sets - 10 reps   ________________________________________________________________________  OBJECTIVE: measures were taken at time of initial evaluation unless otherwise specified  MMT:    MMT Right  Left   Hip flexion 5/5 5/5  Hip extension    Hip abduction 4/5 4/5  Hip adduction 4/5 4/5  Hip internal rotation    Hip external rotation    Knee flexion 5/5 5/5  Knee extension 5/5 5/5  Ankle dorsiflexion 3+/5 3+/5  Ankle plantarflexion    Ankle inversion    Ankle eversion    (Blank rows = not tested)  TRANSFERS: Assistive device utilized: Environmental consultant - 2 wheeled  Sit to stand: SBA Stand to sit: SBA Pt stands at [employer] x 1:26, prior to needing to sit.  GAIT: Gait pattern: step to pattern, step through pattern, ataxic, and wide BOS Distance walked: 20 ft Assistive device utilized: [place] - 2 wheeled Level of assistance: CGA and Min A Comments: With TUG test.  Pt reports difficulty with ramp/incline negotiation.  FUNCTIONAL TESTs:  Timed up and go (TUG): 53.34 sec BERG BALANCE: 28/56 (08/19/21)   HOME EXERCISE PROGRAM: MedBridge Access Code: [email or web address] URL: [email or web address] Date: 07/20/2021 Prepared by: [facility]  _______________________________________________________________________________    GOALS:  FOR RECERT 1/82/9937: SHORT TERM GOALS: Target date: 09/30/2021  Pt will be independent with  progression of HEP for improved strength, balance, gait and progression to use of apartment complex pool for exercise. Baseline: Goal status: IN PROGRESS  2.  Pt will improve 5x sit<>stand to less with no LOB and mod independently, to demonstrate improved functional strength and transfer efficiency. Baseline: decreased eccentric control; supervision Goal status: IN PROGRESS  LONG TERM GOALS: Target date: 10/28/2021  Pt will be independent with final progression of HEP for improved strength, balance, transfers, and gait. Baseline:  Goal status: IN PROGRESS  2.  Pt will verbalize understanding of fall prevention in home environment.   Baseline:  Goal Status:  IN PROGRESS  3.  Pt will improve TUG score to less than or equal to 35 sec for decreased fall risk. Baseline:  43.82 sec 09/02/2021 Goal status:  IN PROGRESS  4.  Berg Balance test to improve to at least 33/56 for decreased fall risk. Baseline: 19/56 07/22/21 at start of care; 28/56 on 08/19/21 Goal status: IN PROGRESS  5.  Pt will verbalize plans for ongoing community fitness, including aquatic fitness options, upon d/c from PT. Baseline:  Goal status: IN PROGRESS   CLINICAL IMPRESSION: Skilled PT session focused on functional lower extremity strengthening for step up and preparation for stairs  for the pool.  With repetition with exercises, pt demo improved control.  Able to initiate forward step taps while standing on 4" step, working on control for descending stairs.  Pt fatigues quickly with these activities, but does well overall.  She will continue to benefit from skilled PT towards goals.  OBJECTIVE IMPAIRMENTS Abnormal gait, decreased activity tolerance, decreased balance, decreased endurance, decreased mobility, difficulty walking, decreased strength, and postural dysfunction.   ACTIVITY LIMITATIONS community activity and community fitness .   PERSONAL FACTORS  PMH:  Cerebellar atrophy with ataxia, chronic low back pain  s/p radiofrequency ablation 3/22; trigger point injections for L tennis elbow; myofascial pain and fibromyalgia.  lumbar spondylosis.  DM, neuropathy, closed foot fracture fall of 2022, HLD, PE, spinal stenosis, are also affecting patient's functional outcome.    REHAB POTENTIAL: Good  CLINICAL DECISION MAKING: Evolving/moderate complexity  EVALUATION COMPLEXITY: Moderate  PLAN: PT FREQUENCY: 2x/week  PT DURATION: 8 weeks  (per recert 04/14/4386)  PLANNED INTERVENTIONS: Therapeutic exercises, Therapeutic activity, Neuromuscular re-education, Balance training, Gait training, Patient/Family education, Joint mobilization, DME instructions, Aquatic Therapy, and Manual therapy  PLAN FOR NEXT SESSION: .  Check STGs next week/add more appts.  Continue to work on short distance gait, progression of standing exercises, stair negotiation for when pt can use pool at her apartment complex.  Continue to progress lower extremity strength and standing balance. Core stability, posture exercises. Standing exercises at counter-head turns/nods, lateral or A/P weightshifting, reaching tasks.     Mady Haagensen, PT 09/21/21 8:43 AM Phone: [phone] Fax: [phone]   [facility] at [facility] [place], [address], [place] [address] Phone # [phone] Fax # [phone]

## 2021-09-22 ENCOUNTER — Ambulatory Visit: Payer: Medicare Other | Admitting: Physical Therapy

## 2021-09-22 DIAGNOSIS — M7712 Lateral epicondylitis, left elbow: Secondary | ICD-10-CM | POA: Diagnosis not present

## 2021-09-22 DIAGNOSIS — M6281 Muscle weakness (generalized): Secondary | ICD-10-CM

## 2021-09-22 DIAGNOSIS — R278 Other lack of coordination: Secondary | ICD-10-CM | POA: Diagnosis not present

## 2021-09-22 DIAGNOSIS — R270 Ataxia, unspecified: Secondary | ICD-10-CM | POA: Diagnosis not present

## 2021-09-22 DIAGNOSIS — R2681 Unsteadiness on feet: Secondary | ICD-10-CM | POA: Diagnosis not present

## 2021-09-22 DIAGNOSIS — M7711 Lateral epicondylitis, right elbow: Secondary | ICD-10-CM | POA: Diagnosis not present

## 2021-09-22 DIAGNOSIS — R2689 Other abnormalities of gait and mobility: Secondary | ICD-10-CM

## 2021-09-23 ENCOUNTER — Encounter: Payer: Self-pay | Admitting: Physical Medicine and Rehabilitation

## 2021-09-23 ENCOUNTER — Encounter
Payer: Medicare Other | Attending: Physical Medicine and Rehabilitation | Admitting: Physical Medicine and Rehabilitation

## 2021-09-23 ENCOUNTER — Encounter: Payer: Self-pay | Admitting: Physical Therapy

## 2021-09-23 VITALS — BP 119/71 | HR 77 | Ht 70.0 in

## 2021-09-23 DIAGNOSIS — M7918 Myalgia, other site: Secondary | ICD-10-CM | POA: Diagnosis not present

## 2021-09-23 DIAGNOSIS — M7711 Lateral epicondylitis, right elbow: Secondary | ICD-10-CM | POA: Diagnosis not present

## 2021-09-23 DIAGNOSIS — M7712 Lateral epicondylitis, left elbow: Secondary | ICD-10-CM | POA: Diagnosis not present

## 2021-09-23 DIAGNOSIS — Z993 Dependence on wheelchair: Secondary | ICD-10-CM | POA: Diagnosis not present

## 2021-09-23 DIAGNOSIS — G119 Hereditary ataxia, unspecified: Secondary | ICD-10-CM | POA: Insufficient documentation

## 2021-09-23 NOTE — Progress Notes (Signed)
Subjective:    Patient ID: Joyce Brown, female    DOB: 01-10-1951, 71 y.o.   MRN: [id number]  HPI  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 Has has Myofascial pain and fibromyalgia.  Here for f/u and trigger point injections.    Doing OK Since May, has been getting outpt PT and OT at [facility].  Going well. Made progress- Got a splint for LUE- pain in LUE was 5/10- now 2/10 with splint. Was custom made by OT.  Having pain still on extensor muscles of Forearm, near elbow- they are focusing U/S on that area Walking 50 ft with therapy and doing some aquatic therapy as well.   Thinks getting arthritis on MCP joints B/L  Mainly aching when writing.   Still tight in shoulders and back-  Doing more walking and trying to be upright, causes more low back pain.   Current manual w/c is 71 years old. Cushion got replaced on back and backside got replaced with hybrid ROHO/gel. After last visit.     Pain Inventory Average Pain 2 Pain Right Now 2 My pain is dull  In the last 24 hours, has pain interfered with the following? General activity 0 Relation with others 0 Enjoyment of life 0 What TIME of day is your pain at its worst? morning  and evening Sleep (in general) Good  Pain is worse with: walking and grasping Pain improves with: heat/ice, therapy/exercise, pacing activities, medication, and wearing splint, ultrasound Relief from Meds: 8  Family History  Problem Relation Age of Onset   Cervical cancer Mother    Ovarian cancer Mother    Dementia Father    Retinal detachment Father    Heart attack Father    Diabetes Father    Stroke Father    Hypertension Father    Ovarian cancer Sister    Arthritis  Sister    Arthritis Sister    Arthritis Sister    Glaucoma Paternal Grandfather    Social History   Socioeconomic History   Marital status: Single    Spouse name: Not on file   Number of children: 0   Years of education: 16   Highest education level: Bachelor's degree (e.g., BA, AB, BS)  Occupational History   Occupation: Retired  Tobacco Use   Smoking status: Never   Smokeless tobacco: Never  Vaping Use   Vaping Use: Never used  Substance and Sexual Activity   Alcohol use: Never   Drug use: Never   Sexual activity: Not Currently  Other Topics Concern   Not on file  Social History Narrative   Patient lives alone in [place].    Patient has 2 cats- Monica and [place].   Patient has 4 sisters- [place], [place], [place], and [place].   Patient swims for exercise.   Social Determinants of Health   Financial Resource Strain: Low Risk  (11/25/2020)   Overall Financial Resource Strain (CARDIA)    Difficulty of Paying Living Expenses: Not hard at all  Food Insecurity: No Food Insecurity (11/25/2020)   Hunger Vital Sign    Worried About Running Out of Food in the Last Year: Never true    Ran Out of Food in the Last Year:  Never true  Transportation Needs: No Transportation Needs (11/25/2020)   PRAPARE - Hydrologist (Medical): No    Lack of Transportation (Non-Medical): No  Physical Activity: Sufficiently Active (11/25/2020)   Exercise Vital Sign    Days of Exercise per Week: 3 days    Minutes of Exercise per Session: 50 min  Stress: No Stress Concern Present (11/25/2020)   [place]    Feeling of Stress : Only a little  Social Connections: Socially Isolated (11/25/2020)   Social Connection and Isolation Panel [NHANES]    Frequency of Communication with Friends and Family: More than three times a week    Frequency of Social Gatherings with Friends and Family: Twice a week     Attends Religious Services: Never    Marine scientist or Organizations: No    Attends Music therapist: Never    Marital Status: Never married   Past Surgical History:  Procedure Laterality Date   ABDOMINAL HYSTERECTOMY     ADENOIDECTOMY     ANKLE SURGERY Left    [place] Left 2019   benign   ELBOW SURGERY     GASTRIC BYPASS  2014   HAND NEUROPLASTY     radio denervation  05/21/2020   TONSILLECTOMY     Past Surgical History:  Procedure Laterality Date   ABDOMINAL HYSTERECTOMY     ADENOIDECTOMY     ANKLE SURGERY Left    BILATERAL CARPAL TUNNEL RELEASE     BREAST BIOPSY Left 2019   benign   ELBOW SURGERY     GASTRIC BYPASS  2014   HAND NEUROPLASTY     radio denervation  05/21/2020   TONSILLECTOMY     Past Medical History:  Diagnosis Date   Cerebellar ataxia ([place])    Diabetes mellitus without complication ([place])    Diabetic neuropathy ([place]) 12/09/2020   Foot fracture 2022   Gait abnormality 11/07/2018   Hyperlipidemia    Pulmonary embolism ([facility])    Sleep apnea    Spinal stenosis    BP 119/71   Pulse 77   Ht '5\' 10"'$  (1.778 m)   SpO2 99%   BMI 26.66 kg/m   Opioid Risk Score:   Fall Risk Score:  `1  Depression screen [facility] 2/9     09/23/2021    8:54 AM 08/18/2021    9:41 AM 06/15/2021    8:32 AM 06/01/2021    1:41 PM 05/18/2021    8:33 AM 05/07/2021    8:54 AM 04/30/2021    8:56 AM  Depression screen PHQ 2/9  Decreased Interest 0 0 '1 1 1  2  '$ Down, Depressed, Hopeless 0 0 '1 1 1  '$ 0  PHQ - 2 Score 0 0 '2 2 2  2  '$ Altered sleeping  0  '1 2 1 1  '$ Tired, decreased energy  '1  1 2 1 2  '$ Change in appetite  0  '1 2 1 2  '$ Feeling bad or failure about yourself   0  0 0 0 0  Trouble concentrating  0  '1 2 1 2  '$ Moving slowly or fidgety/restless  0  1 0 0 0  Suicidal thoughts  0  0 0 0 0  PHQ-9 Score  '1  7 10  9  '$ Difficult doing work/chores  Somewhat difficult  Somewhat difficult  Somewhat difficult Somewhat difficult  Review of Systems  Constitutional: Negative.   HENT: Negative.    Eyes: Negative.   Respiratory: Negative.    Cardiovascular: Negative.   Gastrointestinal: Negative.   Endocrine: Negative.   Genitourinary: Negative.   Musculoskeletal:  Positive for back pain and gait problem.  Skin: Negative.   Allergic/Immunologic: Negative.   Hematological: Negative.   Psychiatric/Behavioral: Negative.        Objective:   Physical Exam Awake, alert, appropriate, in manual w/c; NAD Wearing custom splint on LUE for hand/wrist TTP over extensor compartment near elbow on L Also TTP over thenar eminence B/L - c/w myofascial pain       Assessment & Plan:   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 Has has Myofascial pain and fibromyalgia.  Here for f/u on cerebellar ataxia and trigger point injections.    Voltaren/Diclofenac gel it's over the counter- 1% gel- can use up to 4x/day- use a dime sized dollop and rub on hands up to 4x/dya- don't use before washing hands.  Check with PT to see if rolling walker is the right height for her- That could explain some of her UE pain.   3. Patient here for trigger point injections for myofascial pain  Consent done and on chart.  Cleaned areas with alcohol and injected using a 27 gauge 1.5 inch needle  Injected 2cc Using 1% Lidocaine with no EPI  Upper traps B/L  Levators Posterior scalenes Middle scalenes Splenius Capitus Pectoralis Major Rhomboids Infraspinatus Teres Major/minor Thoracic paraspinals Lumbar paraspinals- R paraspinals x2 Other injections- L extensor compartment of forearm and B/L thenar eminence   Patient's level of pain prior was 2/10 Current level of pain  after injections is- 2/10- hasn't kicked in yet  There was no bleeding or complications.  Patient was advised to drink a lot of water on day after injections to flush system Will have increased soreness for 12-48 hours after injections.  Can use Lidocaine patches the day AFTER injections Can use theracane on day of injections in places didn't inject Can use heating pad 4-6 hours AFTER injections   4. Uses numotion for w/c- has hx of carpal tunnel and tennis elbow B/L- shoulders OK but elbows and hands not- could meet criteria for a power assist w/c.  Will discuss further at further appointments. I think it's worthwhile to consider. Let me know at next appointment when exactly w/c is at 5 years so can plan.   5. F/U in 3 months- for trigger point injections and f/u on w/c dependence/cerebellar ataxia.    I spent a total of  34  minutes on total care today- >50% coordination of care- due to education w/c options including power assist w/c and different trigger point injections. 10 minutes on injections 24 minutes on f/u as detailed above.

## 2021-09-23 NOTE — Therapy (Signed)
OUTPATIENT PHYSICAL THERAPY NEURO TREATMENT   Patient Name: Joyce Brown MRN: [id number] DOB:[birth year], 71 y.o., female Today's Date: 09/23/2021  PCP: Talbert Cage, MD REFERRING PROVIDER: Marcial Pacas, MD     END OF SESSION:   PT End of Session - 09/22/21 2203     Visit Number 18    Number of Visits 30    Date for PT Re-Evaluation 10/28/21    Authorization Type Medicare/[employer]    Progress Note Due on Visit 16    PT Start Time 0930    PT Stop Time 1014    PT Time Calculation (min) 44 min    Equipment Utilized During Treatment Other (comment)   large bar bell, aquatic cuffs, aquatic weights   Activity Tolerance Patient tolerated treatment well    Behavior During Therapy WFL for tasks assessed/performed                    Past Medical History:  Diagnosis Date   Cerebellar ataxia ([place])    Diabetes mellitus without complication ([place])    Diabetic neuropathy ([place]) 12/09/2020   Foot fracture 2022   Gait abnormality 11/07/2018   Hyperlipidemia    Pulmonary embolism ([place])    Sleep apnea    Spinal stenosis    Past Surgical History:  Procedure Laterality Date   ABDOMINAL HYSTERECTOMY     ADENOIDECTOMY     ANKLE SURGERY Left    BILATERAL CARPAL TUNNEL RELEASE     BREAST BIOPSY Left 2019   benign   ELBOW SURGERY     GASTRIC BYPASS  2014   HAND NEUROPLASTY     radio denervation  05/21/2020   TONSILLECTOMY     Patient Active Problem List   Diagnosis Date Noted   H/O bariatric surgery 06/29/2021   Bilateral tennis elbow 06/15/2021   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   Type 1 diabetes mellitus with diabetic polyneuropathy ([place]) 12/25/2020   Diabetic neuropathy ([place]) 12/09/2020   Myofascial pain 09/12/2020   Wheelchair dependence 09/12/2020   Dyslipidemia 06/19/2020   Spondylosis without myelopathy or radiculopathy, lumbar region 04/27/2020   Sensorineural hearing loss (SNHL) of both ears 08/21/2019   Tinnitus, bilateral 06/27/2019   FH:  ovarian cancer in first degree relative 11/16/2018   Hypercholesteremia 11/15/2018   Gait abnormality 11/07/2018   Pain of left hip joint 05/18/2018   Chronic midline low back pain with bilateral sciatica 05/18/2018   Breast calcifications 12/28/2017   Fibromyalgia 12/06/2017   History of gastric bypass 12/06/2017   Spinal stenosis 12/06/2017   Asthma 12/06/2017   Cerebellar ataxia ([place])    Type I diabetes mellitus with complication, uncontrolled    Serum albumin decreased 11/10/2017   Vitamin B6 deficiency 11/10/2017   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   Hypoglycemia due to type 1 diabetes mellitus ([place]) 06/20/2016   Hyperparathyroidism ([place]) 10/29/2014   Increased PTH level 10/01/2014   Obstructive sleep apnea on CPAP 10/01/2014   Non-organic enuresis 05/28/2014   Urge incontinence of urine 05/28/2014   History of vaginal hysterectomy 05/24/2014   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   Pain of lumbar facet joint 04/11/2013   Mild persistent asthma without complication 35/00/9381   Carpal tunnel syndrome 82/99/3716   Lichen planus 96/78/9381   Adjustment disorder with mixed anxiety and depressed mood 05/11/2012   Degeneration of lumbar intervertebral disc 04/26/2012   Pain in joint involving ankle and foot 04/26/2012   Osteoarthritis of hand 05/24/2011   Leukopenia 09/25/2009  ONSET DATE: 07/06/2021 (MD referral); initial symptoms 2007  REFERRING DIAG: G11.9 (ICD-10-CM) - Cerebellar ataxia ([place]) R26.9 (ICD-10-CM) - Gait abnormality   THERAPY DIAG:  Unsteadiness on feet  Muscle weakness (generalized)  Other abnormalities of gait and mobility  PERTINENT HISTORY: Has Cerebellar atrophy with ataxia and also chronic low back pain s/p radiofrequency ablation 3/22; Has had trigger point injections for L tennis elbow. Has has Myofascial pain and fibromyalgia.  lumbar spondylosis, chronic LBP  PMH:  DM, neuropathy, closed foot fracture fall of 2022, HLD,  PE, spinal stenosis, tennis elbow                                                                                                                                                                                            PRECAUTIONS: Fall   -hx of Type 1 DM, carries glucose tablets with her; follows the "Rule of 15"  PATIENT GOALS: Pt's goals for therapy are to increase strength and mobility.     SUBJECTIVE: No new complaints. No falls or pain to report.     PAIN:  Are you having pain? No    TODAY'S TREATMENT: Aquatic therapy at [facility] - pool temp 90 degrees  Patient seen for aquatic therapy today.  Treatment took place in water 3.5-4.5 feet deep depending upon activity.  Pt entered/exited pool via stairs with bil rails, step to pattern with min guard to descend and up to min assist for last 2 steps with ascending.   Use of large bar bell for gait forward from stairs till about 4.0 to 4.3 foot water depth. Use of large bar bell for gait 18 feet across pool with min/mod assist for balance as follows:  cues with all gait for hip extension/knee extension to improve posture Forward x 8 laps with cues for posture/hip extension, step placement and weight shifting Backward x 8 laps with cues for posture and step length Side stepping left<>right x 5 laps toward each side with cues for posture and step length.    At wall in ~4.0 foot water depth with UE support on pool wall: facing the wall pt performed the following ex's with cues on form/technique, min guard assist for safety, aquatic cuffs to bil ankles. Heel toe raises x 15 reps Alternating slow high knee marching  15 reps each side Alternating HS curls x 15 reps each side Alternating hip abd/add x 15 reps each side Alternating hip extension x 15 reps each side   Aquatic cuffs removed and use of large bar bell for gait from deeper end to bench in shallow end of pool with min/mod assist.   Seated  back on bench with 2# ankle weights  to bil LE's Long arc quads x 10 reps Hip abd/add with LE's in extension x 10 reps Scissor kicks x 10 reps   Seated at edge of bench with feet on floor, use of single bar bells for the following activities: Shoulder horizontal abd/add at water level for 15 reps Holding bar bells out in front- alternating lowering bar bell down under water to knee<>back up to water surface for 15 reps each side with assist at knees to stabilize pt/keep her from floating away Holding bar bells out at sides: alternating lowering one at a time down to bench<>back to water surface for 10 reps each side.    Use of wall/large bar bell for gait from bench in water to stairs to exit pool with min/mod assist.     Pt requires buoyancy of water for support for reduced fall risk with gait training and balance exercises with minimal UE support; exercises able to be performed safely in water without the risk of fall compared to those same exercises performed on land;  viscosity of water needed for resistance for strengthening.  Current of water provides perturbations for challenging static & dynamic standing balance.        HOME EXERCISE PROGRAM: Access Code: [email or web address] URL: [email or web address] Date: 08/24/2021 Prepared by: [facility]  Exercises - Seated Gluteal Sets  - 1 x daily - 7 x weekly - 2 sets - 10 reps - 3 sec hold - Seated Active Hip Flexion  - 1 x daily - 7 x weekly - 2 sets - 10 reps - 5 sec hold - Seated Shoulder Row with Anchored Resistance  - 1 x daily - 5 x weekly - 2 sets - 10 reps - Seated Shoulder Extension and Scapular Retraction with Resistance  - 1 x daily - 5 x weekly - 2 sets - 10 reps - Modified Thomas Stretch  - 1 x daily - 5 x weekly - 2 sets - 30 sec hold - Sit to Stand with Counter Support  - 1 x daily - 5 x weekly - 2 sets - 5 reps - Lower Trunk Rotations  - 1-2 x daily - 7 x weekly - 1 sets - 5-10 reps - Hooklying Isometric Clamshell   - 1 x daily - 5 x weekly - 1-2 sets - 10 reps - Seated Heel Raise  - 1 x daily - 5 x weekly - 1-2 sets - 10 reps - Standing Hip Abduction with Counter Support  - 1 x daily - 5 x weekly - 2 sets - 10 reps     GOALS:  FOR RECERT 0/94/7096: SHORT TERM GOALS: Target date: 09/30/2021  Pt will be independent with progression of HEP for improved strength, balance, gait and progression to use of apartment complex pool for exercise. Baseline: Goal status: IN PROGRESS  2.  Pt will improve 5x sit<>stand to less with no LOB and mod independently, to demonstrate improved functional strength and transfer efficiency. Baseline: decreased eccentric control; supervision Goal status: IN PROGRESS  LONG TERM GOALS: Target date: 10/28/2021  Pt will be independent with final progression of HEP for improved strength, balance, transfers, and gait. Baseline:  Goal status: IN PROGRESS  2.  Pt will verbalize understanding of fall prevention in home environment.   Baseline:  Goal Status:  IN PROGRESS  3.  Pt will improve TUG score to less than or equal to 35 sec for decreased  fall risk. Baseline:  43.82 sec 09/02/2021 Goal status:  IN PROGRESS  4.  Berg Balance test to improve to at least 33/56 for decreased fall risk. Baseline: 19/56 07/22/21 at start of care; 28/56 on 08/19/21 Goal status: IN PROGRESS  5.  Pt will verbalize plans for ongoing community fitness, including aquatic fitness options, upon d/c from PT. Baseline:  Goal status: IN PROGRESS   CLINICAL IMPRESSION: Skilled PT session continued to focus on gait, strengthening and balance in the aquatic setting with no issues noted or reported. The pt appears to be making steady progress and should benefit from continued PT to progress toward unmet goals.   OBJECTIVE IMPAIRMENTS Abnormal gait, decreased activity tolerance, decreased balance, decreased endurance, decreased mobility, difficulty walking, decreased strength, and postural dysfunction.    ACTIVITY LIMITATIONS community activity and community fitness .   PERSONAL FACTORS  PMH:  Cerebellar atrophy with ataxia, chronic low back pain s/p radiofrequency ablation 3/22; trigger point injections for L tennis elbow; myofascial pain and fibromyalgia.  lumbar spondylosis.  DM, neuropathy, closed foot fracture fall of 2022, HLD, PE, spinal stenosis, are also affecting patient's functional outcome.    REHAB POTENTIAL: Good  CLINICAL DECISION MAKING: Evolving/moderate complexity  EVALUATION COMPLEXITY: Moderate  PLAN: PT FREQUENCY: 2x/week  PT DURATION: 8 weeks  (per recert 07/31/8414)  PLANNED INTERVENTIONS: Therapeutic exercises, Therapeutic activity, Neuromuscular re-education, Balance training, Gait training, Patient/Family education, Joint mobilization, DME instructions, Aquatic Therapy, and Manual therapy  PLAN FOR NEXT SESSION: .  Check STGs next week/add more appts.  Continue to work on short distance gait, progression of standing exercises, stair negotiation for when pt can use pool at her apartment complex.  Continue to progress lower extremity strength and standing balance. Core stability, posture exercises. Standing exercises at counter-head turns/nods, lateral or A/P weightshifting, reaching tasks.     Willow Ora, PTA, [facility] [address] [place], [place] [address] [phone] 09/23/21, 10:05 PM

## 2021-09-23 NOTE — Patient Instructions (Signed)
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 Has has Myofascial pain and fibromyalgia.  Here for f/u on cerebellar ataxia and trigger point injections.    Voltaren/Diclofenac gel it's over the counter- 1% gel- can use up to 4x/day- use a dime sized dollop and rub on hands up to 4x/dya- don't use before washing hands.  Check with PT to see if rolling walker is the right height for her- That could explain some of her UE pain.   3. Patient here for trigger point injections for myofascial pain  Consent done and on chart.  Cleaned areas with alcohol and injected using a 27 gauge 1.5 inch needle  Injected 2cc Using 1% Lidocaine with no EPI  Upper traps B/L  Levators Posterior scalenes Middle scalenes Splenius Capitus Pectoralis Major Rhomboids Infraspinatus Teres Major/minor Thoracic paraspinals Lumbar paraspinals- R paraspinals x2 Other injections- L extensor compartment of forearm and B/L thenar eminence   Patient's level of pain prior was 2/10 Current level of pain after injections is- 2/10- hasn't kicked in yet  There was no bleeding or complications.  Patient was advised to drink a lot of water on day after injections to flush system Will have increased soreness for 12-48 hours after injections.  Can use Lidocaine patches the day AFTER injections Can use theracane on day of injections in places didn't inject Can use heating pad 4-6 hours AFTER injections   4. Uses numotion for w/c- has hx of carpal tunnel and tennis elbow B/L- shoulders OK but elbows and hands not- could meet criteria for a power assist w/c.  Will discuss further at further appointments. I think it's worthwhile to consider. Let me know at next  appointment when exactly w/c is at 5 years so can plan.   5. F/U in 38month- for trigger point injections and f/u on w/c dependence/cerebellar ataxia.

## 2021-09-24 ENCOUNTER — Encounter: Payer: Self-pay | Admitting: Family Medicine

## 2021-09-24 DIAGNOSIS — K121 Other forms of stomatitis: Secondary | ICD-10-CM | POA: Diagnosis not present

## 2021-09-24 DIAGNOSIS — K146 Glossodynia: Secondary | ICD-10-CM | POA: Insufficient documentation

## 2021-09-25 ENCOUNTER — Ambulatory Visit: Payer: Medicare Other | Admitting: Occupational Therapy

## 2021-09-25 DIAGNOSIS — R278 Other lack of coordination: Secondary | ICD-10-CM

## 2021-09-25 DIAGNOSIS — M7711 Lateral epicondylitis, right elbow: Secondary | ICD-10-CM | POA: Diagnosis not present

## 2021-09-25 DIAGNOSIS — M7712 Lateral epicondylitis, left elbow: Secondary | ICD-10-CM | POA: Diagnosis not present

## 2021-09-25 DIAGNOSIS — R270 Ataxia, unspecified: Secondary | ICD-10-CM | POA: Diagnosis not present

## 2021-09-25 DIAGNOSIS — R2681 Unsteadiness on feet: Secondary | ICD-10-CM | POA: Diagnosis not present

## 2021-09-25 DIAGNOSIS — M6281 Muscle weakness (generalized): Secondary | ICD-10-CM | POA: Diagnosis not present

## 2021-09-25 NOTE — Therapy (Signed)
OUTPATIENT OCCUPATIONAL THERAPY TREATMENT NOTE   Patient Name: Joyce Brown MRN: [id number] DOB:01/22/1951, 71 y.o., female Today's Date: 09/25/2021  PCP: Talbert Cage, MD REFERRING PROVIDER: Marcial Pacas, MD  END OF SESSION:   OT End of Session - 09/25/21 1133     Visit Number 11    Number of Visits 17    Date for OT Re-Evaluation 10/07/21    Authorization Type Medicare    Progress Note Due on Visit 10    OT Start Time 0800    OT Stop Time 0845    OT Time Calculation (min) 45 min    Activity Tolerance Patient tolerated treatment well    Behavior During Therapy WFL for tasks assessed/performed              Past Medical History:  Diagnosis Date   Cerebellar ataxia ([place])    Diabetes mellitus without complication ([place])    Diabetic neuropathy ([place]) 12/09/2020   Foot fracture 2022   Gait abnormality 11/07/2018   Hyperlipidemia    Pulmonary embolism ([place])    Sleep apnea    Spinal stenosis    Past Surgical History:  Procedure Laterality Date   ABDOMINAL HYSTERECTOMY     ADENOIDECTOMY     ANKLE SURGERY Left    BILATERAL CARPAL TUNNEL RELEASE     BREAST BIOPSY Left 2019   benign   ELBOW SURGERY     GASTRIC BYPASS  2014   HAND NEUROPLASTY     radio denervation  05/21/2020   TONSILLECTOMY     Patient Active Problem List   Diagnosis Date Noted   H/O bariatric surgery 06/29/2021   Bilateral tennis elbow 06/15/2021   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   Type 1 diabetes mellitus with diabetic polyneuropathy ([place]) 12/25/2020   Diabetic neuropathy ([place]) 12/09/2020   Myofascial pain 09/12/2020   Wheelchair dependence 09/12/2020   Dyslipidemia 06/19/2020   Spondylosis without myelopathy or radiculopathy, lumbar region 04/27/2020   Sensorineural hearing loss (SNHL) of both ears 08/21/2019   Tinnitus, bilateral 06/27/2019   FH: ovarian cancer in first degree relative 11/16/2018   Hypercholesteremia 11/15/2018   Gait abnormality 11/07/2018    Pain of left hip joint 05/18/2018   Chronic midline low back pain with bilateral sciatica 05/18/2018   Breast calcifications 12/28/2017   Fibromyalgia 12/06/2017   History of gastric bypass 12/06/2017   Spinal stenosis 12/06/2017   Asthma 12/06/2017   Cerebellar ataxia ([place])    Type I diabetes mellitus with complication, uncontrolled    Serum albumin decreased 11/10/2017   Vitamin B6 deficiency 11/10/2017   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   Hypoglycemia due to type 1 diabetes mellitus ([place]) 06/20/2016   Hyperparathyroidism ([place]) 10/29/2014   Increased PTH level 10/01/2014   Obstructive sleep apnea on CPAP 10/01/2014   Non-organic enuresis 05/28/2014   Urge incontinence of urine 05/28/2014   History of vaginal hysterectomy 05/24/2014   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   Pain of lumbar facet joint 04/11/2013   Mild persistent asthma without complication 48/27/0786   Carpal tunnel syndrome 75/44/9201   Lichen planus 00/71/2197   Adjustment disorder with mixed anxiety and depressed mood 05/11/2012   Degeneration of lumbar intervertebral disc 04/26/2012   Pain in joint involving ankle and foot 04/26/2012   Osteoarthritis of hand 05/24/2011   Leukopenia 09/25/2009    ONSET DATE: 07/22/21 (referral date)  REFERRING DIAG:  R26.89 (ICD-10-CM) - Other abnormalities of gait and mobility R29.818 (ICD-10-CM) - Other symptoms and signs involving  the nervous system   THERAPY DIAG:  Lateral epicondylitis, left elbow  Other lack of coordination  Ataxia  Muscle weakness (generalized)  SUBJECTIVE:   SUBJECTIVE STATEMENT: "I got some trigger point injections on Wednesday.  That was good."  PAIN: Are you having pain? Yes: NPRS scale: 2/10 Pain location: slightly distal to L elbow Pain description: neuropathic pain Aggravating factors: movement, specific positions (supination, reaching to the left) Relieving factors: pain patches, warm pack  PERTINENT  HISTORY: Cerebellar atrophy w/ ataxia and chronic low back pain s/p radiofrequency ablation 3/22; myofascial pain and fibromyalgia; bilateral epicondylitis elbow (symptoms started in April 2023)  PMH includes T1DM w/ diabetic neuropathy, asthma, OSA on CPAP, and hx of HTN, PE, foot fracture in 2022, medial epicondylectomy (approx '98), bilateral carpal tunnel release  PRECAUTIONS: Fall; carries glucose tablets and follows the "Rule of 15" for T1DM, 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  Pt reports noting improvements in pain tolerance and engagement in functional tasks.  Engaged in discussion about recommendation from previous therapist that if pain remains in 1-2 range to allow for reduced splint wear and to take a break from therapy for ~a month before returning to begin working on strengthening.  Pt in agreement in with this plan. Pt able to demonstrate active stretches per bilateral epicondylitis protocol; pt completed 5 reps of 3rd exercise (wrist flexion w/ LUE at chest height, elbow extended, and forearm in neutral) but was unable to tolerate remaining 2 repetitions. Pt noting "that's stiff today", however demonstrating improvements with repetition. Engaged in Ultrasound treatment applied to L anterior forearm for 8 min to address pain relief and promote blood flow for tissue healing; pt verbally denied contraindications or precautions. No adverse reaction observed or reported after application; skin intact Parameters: 3.3 MHz, 20% duty cycle, and intensity of 1.0 W/cm2    PATIENT EDUCATION: Ongoing condition-specific education and discussion regarding POC: particularly related to continued pain management, wear/care of orthosis (slowly  decreasing wear time), progress toward goals w/ interpretation of changes, and continued progression of lateral epicondylitis protocol as able Person educated: Patient Education method: Customer service manager Education comprehension: verbalized understanding   HOME EXERCISE PROGRAM: - [facility] Code: [email or web address] - Active stretches per [place] [facility] protocol for lateral epicondylitis (printed handout administered)   GOALS: Goals reviewed with patient? Yes  SHORT TERM GOALS: Target date: 08/28/2021  STG  Status:  1 Pt will verbalize understanding of at least 2 precautions and positional considerations related to worsening of lateral epicondylitis symptoms Baseline: Decreased knowledge of precautions Achieved - 08/05/21  2 Pt will demonstrate understanding of initial HEP designed for increased BUE GMC, ROM, and stretching Baseline: No HEP at this time Achieved - 09/09/21  3 Pt will demonstrate understanding of at least 1 visual compensatory strategy to reduce functional impact of visual perceptual limitations Baseline: Reports diplopia; no currently compensatory strategies Achieved - 08/26/21    LONG TERM GOALS: Target date: 09/25/2021  LTG  Status:  1 Pt will demonstrate understanding of initial HEP designed for increased BUE GMC, ROM, and stretching Baseline: No HEP at this time Met - pt reports completing exercises at home per protocol, completing heat and massage 1-2x/day  2 Pt will improve L hand grip strength by  at least 9 lbs w/ pain less than 2/10 by discharge Baseline: 35 lbs (RUE 58 lbs) Met - 09/25/21: 49 lbs  3 Pt will increase Box and Blocks score bilaterally by at least 3 blocks to demonstrate improved GMC Baseline: 34 w/ RUE; 26 w/ LUE Progressing - 09/25/21: 36 w/ RUE, 28 w/ LUE   4 Pt will be able to grip and lift a lightweight object (e.g., coffee mug) w/out pain in L elbow Baseline: Pain w/ grasp and lifting Progressing - continues to report inconsistent pain, reporting  that is depends on what all she has done that day  5 Pt will decrease time to complete 9-HPT by at least 4 seconds to indicate improved control and coordination for functional bilateral FM tasks Baseline: 54.9 sec (RUE 38.5 sec) Met - 09/16/21: 45.5 sec w/ RUE     ASSESSMENT:  CLINICAL IMPRESSION: Pt continues to endorse improvement in symptoms of lateral epicondylitis with incorporation of massage, moist heat, ultrasound treatment in therapy, and continued wrist immobilization w/ elbow counterforce brace. Therapist reassessed progress toward goals this session with pt demonstrating increased improvement in LUE grip strength to 49#, improvements by 2 bilaterally in Box and Blocks test indicating progress, but no significant change in unilateral [place]. Pt able to demonstrate active stretches as previously introduced and reports utilizing heat and massage.    At this time, plan to hold on skilled occupational therapy services for ~ 4 weeks due to progress towards goals and plan to re-assess as pain continues to improve with massage, moist heat, and active stretches and then return to occupational therapy to begin implementing some strengthening. Pt encouraged to call back before that time with any changes or if any relevant functional deficits develop/occur. Otherwise plan to resume and reassess therapy needs in ~4 weeks.  PERFORMANCE DEFICITS in functional skills including ADLs, IADLs, coordination, dexterity, sensation, tone, ROM, strength, pain, FMC, GMC, mobility, balance, body mechanics, decreased knowledge of precautions, decreased knowledge of use of DME, vision, and UE functional use.   IMPAIRMENTS are limiting patient from ADLs, IADLs, rest and sleep, leisure, and social participation.   COMORBIDITIES has co-morbidities such as DM w/ neuropathy and retinopathy, chronic low back pain, asthma, and myofascial pain  that affects occupational performance. Patient will benefit from skilled OT to  address above impairments and improve overall function.   PLAN: OT FREQUENCY: 2x/week  OT DURATION: 8 weeks  PLANNED INTERVENTIONS: self care/ADL training, therapeutic exercise, therapeutic activity, neuromuscular re-education, manual therapy, passive range of motion, functional mobility training, aquatic therapy, splinting, ultrasound, iontophoresis, compression bandaging, moist heat, cryotherapy, patient/family education, visual/perceptual remediation/compensation, and DME and/or AE instructions  RECOMMENDED OTHER SERVICES: Currently receiving PT services at this location  CONSULTED AND AGREED WITH PLAN OF CARE: Patient  PLAN FOR NEXT SESSION: Reassess function, pain, and GMC/FMC at next session.    Simonne Come, OTR/L 09/25/2021, 11:34 AM

## 2021-09-26 ENCOUNTER — Encounter: Payer: Self-pay | Admitting: Internal Medicine

## 2021-09-28 ENCOUNTER — Encounter: Payer: Self-pay | Admitting: Internal Medicine

## 2021-09-28 ENCOUNTER — Ambulatory Visit (INDEPENDENT_AMBULATORY_CARE_PROVIDER_SITE_OTHER): Payer: Medicare Other | Admitting: Internal Medicine

## 2021-09-28 VITALS — BP 136/88 | HR 75 | Ht 70.0 in | Wt 183.0 lb

## 2021-09-28 DIAGNOSIS — E1065 Type 1 diabetes mellitus with hyperglycemia: Secondary | ICD-10-CM | POA: Diagnosis not present

## 2021-09-28 DIAGNOSIS — Z9884 Bariatric surgery status: Secondary | ICD-10-CM

## 2021-09-28 DIAGNOSIS — E1042 Type 1 diabetes mellitus with diabetic polyneuropathy: Secondary | ICD-10-CM | POA: Diagnosis not present

## 2021-09-28 LAB — POCT GLYCOSYLATED HEMOGLOBIN (HGB A1C): Hemoglobin A1C: 7.9 % — AB (ref 4.0–5.6)

## 2021-09-28 MED ORDER — INPEN 100-PINK-NOVOLOG-FIASP DEVI: 1.0000 | Freq: Once | 0 refills | Status: AC

## 2021-09-28 MED ORDER — GVOKE HYPOPEN 1-PACK 1 MG/0.2ML ~~LOC~~ SOAJ
1.0000 mg | SUBCUTANEOUS | 1 refills | Status: DC
Start: 2021-09-28 — End: 2022-11-25

## 2021-09-28 NOTE — Patient Instructions (Addendum)
-   Increase  Tresiba 14 units daily  - Change Novolog 8 units with Breakfast , 10 units with Lunch and 7 units supper Novolog correctional insulin: ADD extra units on insulin to your meal-time Novolog dose if your blood sugars are higher than 195. Use the scale below to help guide you:   Blood sugar before meal Number of units to inject  Less than 195 0 unit  196 - 260 1 units  261 - 325 2 units  326 - 390 3 units  391 - 455 4 units     HOW TO TREAT LOW BLOOD SUGARS (Blood sugar LESS THAN 70 MG/DL) Please follow the RULE OF 15 for the treatment of hypoglycemia treatment (when your (blood sugars are less than 70 mg/dL)   STEP 1: Take 15 grams of carbohydrates when your blood sugar is low, which includes:  3-4 GLUCOSE TABS  OR 3-4 OZ OF JUICE OR REGULAR SODA OR ONE TUBE OF GLUCOSE GEL    STEP 2: RECHECK blood sugar in 15 MINUTES STEP 3: If your blood sugar is still low at the 15 minute recheck --> then, go back to STEP 1 and treat AGAIN with another 15 grams of carbohydrates.

## 2021-09-28 NOTE — Progress Notes (Signed)
Name: Joyce Brown  Age/ Sex: 71 y.o., female   MRN/ DOB: [id number], [birth year]     PCP: Lind Covert, MD   Reason for Endocrinology Evaluation: Type 1 Diabetes Mellitus  Initial Endocrine Consultative Visit: 01/17/2018    PATIENT IDENTIFIER: Joyce Brown is a 71 y.o. female with a past medical history of T1DM, cerebellar ataxia , asthma, OSA on CPAP and Hx of gastric bypass in 2014  . The patient has followed with Endocrinology clinic since 01/17/18 for consultative assistance with management of her diabetes.  Patient moved from [place] in September, 2019 to be close to her sister. She has been diagnosed with stiff person Syndrome but Dr. Jannifer Franklin thought this may be more of cerebellar atrophy. She was on  IVIG until 11/07/2018. She is on cellcept  DIABETIC HISTORY:  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 She has only had one DKA episode which was in 11/2017. Marland Kitchen Her hemoglobin A1c has ranged from 8.4% in 01/2017, peaking at 12.1 % in 01/2016  She is followed by Dr. 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 She has been off IVIG since 02/2018. He has been tapering her diazepam down and is currently on cellcept.    Started using the inpen 07/2018  SUBJECTIVE:   During the last visit (06/26/2021): A1c 6.7 % . We adjusted MDI regimen      Today (09/28/2021): Ms. [name] is here for a months follow up visit on diabetes.  She checks her blood sugars multiple times daily, using  CGM (Dexcom). The patient has had hypoglycemic episodes since the last clinic visit. The patient is symptomatic with these episodes.  These typically occur after lunch and at times after breakfast  She had a right 5th metatarsal fracture 09/2020 following a syncopal episode due to hypoglycemia with a BG reading of 48 mg/DL-follows with podiatry her last  visit was on 06/02/2021  She follows with physical medicine and rehab for ataxia, back pain and received trigger point injection for myofascial pain, last follow-up was July/02/2022 She was fitted for diabetic shoes and insoles on 09/07/2021 She saw her PCP 08/18/2021-urinary incontinence improved with Myrbetriq [facility] neurology 05/28/2021 The InPen co-pay has increased to $467. She is currently on the [place]  Denies nausea, vomiting or diarrhea  HOME DIABETES REGIMEN:  Tresiba 13 units QHS Novolog 8 units  With Breakfast, 11 units with lunch and 8 units with supper  CF (BG - 110/60)    CONTINUOUS GLUCOSE MONITORING RECORD INTERPRETATION    Dates of Recording: 7/4-7/17/2023 Sensor description:Dexcom  Results statistics:   CGM use % of time 93  Average and SD 189/75  Time in range 52%  % Time Above 180 26  % Time above 250 20  % Time Below target <1    Glycemic patterns summary: BG's have been trending up through the night, with worsening hyperglycemia during breakfast and suppertime  Hyperglycemic episodes postprandial  Hypoglycemic episodes occurred following a bolus  Overnight periods: trends upwards  IN-Pen report 7/1-7/14/2023  Average 159 Missed doses: 1 SD 82 TDD 41.4 units   DIABETIC COMPLICATIONS: Microvascular complications:  Neuropathy, left eye retinopathy Denies: retinopathy, CKD Last eye exam: Completed 05/29/2021   Macrovascular complications:    Denies: CAD, PVD, CVA         HISTORY:  Past Medical History:  Past Medical History:  Diagnosis  Date   Cerebellar ataxia ([place])    Diabetes mellitus without complication ([place])    Diabetic neuropathy ([place]) 12/09/2020   Foot fracture 2022   Gait abnormality 11/07/2018   Hyperlipidemia    Pulmonary embolism ([place])    Sleep apnea    Spinal stenosis    Past Surgical History:  Past Surgical History:  Procedure Laterality Date   ABDOMINAL HYSTERECTOMY     ADENOIDECTOMY     ANKLE SURGERY Left     BILATERAL CARPAL TUNNEL RELEASE     BREAST BIOPSY Left 2019   benign   ELBOW SURGERY     GASTRIC BYPASS  2014   HAND NEUROPLASTY     radio denervation  05/21/2020   TONSILLECTOMY     Social History:  reports that she has never smoked. She has never used smokeless tobacco. She reports that she does not drink alcohol and does not use drugs. Family History:  Family History  Problem Relation Age of Onset   Cervical cancer Mother    Ovarian cancer Mother    Dementia Father    Retinal detachment Father    Heart attack Father    Diabetes Father    Stroke Father    Hypertension Father    Ovarian cancer Sister    Arthritis Sister    Arthritis Sister    Arthritis Sister    Glaucoma Paternal Grandfather      HOME MEDICATIONS: Allergies as of 09/28/2021   No Known Allergies      Medication List        Accurate as of September 28, 2021  9:13 AM. If you have any questions, ask your nurse or doctor.          STOP taking these medications    Calcium Citrate 150 MG Caps Stopped by: Dorita Sciara, MD   nystatin 100000 UNIT/ML suspension Commonly known as: MYCOSTATIN Stopped by: Dorita Sciara, MD       TAKE these medications    acetaminophen 325 MG tablet Commonly known as: TYLENOL Take 1,000 mg by mouth every 6 (six) hours as needed for moderate pain.   atorvastatin 80 MG tablet Commonly known as: LIPITOR Take 1 tablet (80 mg total) by mouth daily.   B-D UF III MINI PEN NEEDLES 31G X 5 MM Misc Generic drug: Insulin Pen Needle USE FOUR TIMES A DAY AS DIRECTED   Bariatric Fusion Chew Chew 2 tablets by mouth 2 (two) times daily.   carboxymethylcellulose 0.5 % Soln Commonly known as: REFRESH PLUS Apply to eye.   Dexcom G7 Receiver [employer] Use as directed   [employer] by Does not apply route.   gabapentin 100 MG capsule Commonly known as: NEURONTIN TAKE ONE CAPSULE BY MOUTH THREE TIMES A DAY   glucose blood test strip ASCENSIA CONTOUR  NEXT ONE: Use 1 strip to check blood sugar daily   Insulin Syringe-Needle U-100 30G X 5/16" 0.3 ML Misc Commonly known as: Global Insulin Syringes 1 Device by Does not apply route 3 (three) times daily.   Microlet Lancets Misc 3 (three) times daily.   mycophenolate 500 MG tablet Commonly known as: CELLCEPT 1 tablet (500 mg total). 2 Tablets in the morning and 2 tablet in the evening   Myrbetriq 50 MG Tb24 tablet Generic drug: mirabegron ER Take 50 mg by mouth daily.   NovoLOG FlexPen 100 UNIT/ML FlexPen Generic drug: insulin aspart Max daily 40 units   sertraline 50 MG tablet Commonly known as: ZOLOFT  Take by mouth.   tiZANidine 2 MG tablet Commonly known as: ZANAFLEX TAKE 1 TO 2 TABLETS BY MOUTH AT BEDTIME AS NEEDED FOR MUSCLE SPASMS   Tresiba FlexTouch 100 UNIT/ML FlexTouch Pen Generic drug: insulin degludec Inject 15 Units into the skin daily. What changed: how much to take   Vitamin D3 75 MCG (3000 UT) Tabs Take 75 mcg by mouth daily.        PHYSICAL EXAM: VS: BP 136/88 (BP Location: Left Arm, Patient Position: Sitting, Cuff Size: Large)   Pulse 75   Ht '5\' 10"'$  (1.778 m)   Wt 183 lb (83 kg)   SpO2 99%   BMI 26.26 kg/m    EXAM: General: Pt appears well and is in NAD  Lungs: Clear with good BS bilat with no rales, rhonchi, or wheezes  Heart: Auscultation: RRR   Extremities:  BL LE: no pretibial edema   Mental Status: Judgment, insight: intact Mood and affect: no depression, anxiety, or agitation   DM Foot Exam 06/26/2021 The skin of the feet is intact without sores or ulcerations. The pedal pulses are 2+ on right and 2+ on left. The sensation is intact to a screening 5.07, 10 gram monofilament bilaterally     DATA REVIEWED:  Lab Results  Component Value Date   HGBA1C 7.9 (A) 09/28/2021   HGBA1C 6.1 (A) 06/26/2021   HGBA1C 6.3 (A) 12/25/2020    Latest Reference Range & Units 06/26/21 10:39  Sodium 135 - 145 mEq/L 139  Potassium 3.5 - 5.1  mEq/L 4.4  Chloride 96 - 112 mEq/L 103  CO2 19 - 32 mEq/L 29  Glucose 70 - 99 mg/dL 72  BUN 6 - 23 mg/dL 21  Creatinine 0.40 - 1.20 mg/dL 0.87  Calcium 8.4 - 10.5 mg/dL 9.4  Alkaline Phosphatase 39 - 117 U/L 62  Albumin 3.5 - 5.2 g/dL 4.2  AST 0 - 37 U/L 26  ALT 0 - 35 U/L 23  Total Protein 6.0 - 8.3 g/dL 6.1  Total Bilirubin 0.2 - 1.2 mg/dL 0.4  GFR >60.00 mL/min 68.27    Latest Reference Range & Units 06/26/21 10:39  Total CHOL/HDL Ratio  3  Cholesterol 0 - 200 mg/dL 157  HDL Cholesterol >39.00 mg/dL 57.40  LDL (calc) 0 - 99 mg/dL 81  MICROALB/CREAT RATIO 0.0 - 30.0 mg/g 0.8  NonHDL  99.40  Triglycerides 0.0 - 149.0 mg/dL 94.0  VLDL 0.0 - 40.0 mg/dL 18.8    Latest Reference Range & Units 06/26/21 10:39  Glucose 70 - 99 mg/dL 72  TSH 0.35 - 5.50 uIU/mL 1.37    Latest Reference Range & Units 06/26/21 10:39  Creatinine,U mg/dL 82.4  Microalb, Ur 0.0 - 1.9 mg/dL <0.7  MICROALB/CREAT RATIO 0.0 - 30.0 mg/g 0.8    ASSESSMENT / PLAN / RECOMMENDATIONS:   1) Type 1 Diabetes Mellitus,Sub- Optimally  Controlled - Most recent A1c of 7.9 %. Goal A1c < 7.5 %.   -Patient has been noted with hyperglycemia -I am going to increase her basal rate, I will also reduce her prandial dose with lunch and supper, but will change her correction factor from 60 to 65, this is based on CGM and InPen report -Unfortunately The co-pay for the InPen has increased , A prescription will be sent to [facility] to see if the cost would be better  -I have changed her low settings from 70 to 80 mg/dL on  CGM , so she gets alerts sooner  - A prescription for Gvoke  has been sent, she understands this is only if unconscious  MEDICATIONS: Increase Tresiba 14 units daily  Change Novolog 8 units with breakfast, 10 units with lunch  , and 7 units with  supper  Change Sensitivity factor 65    EDUCATION / INSTRUCTIONS: BG monitoring instructions: Patient is instructed to check her blood sugars 4 times a day with  CGM caliberation. Call [facility] clinic if: BG persistently < 70  I reviewed the Rule of 15 for the treatment of hypoglycemia in detail with the patient.     2) Diabetic complications:  Eye: Does not have known diabetic retinopathy.  Neuro/ Feet: Does not have known diabetic peripheral neuropathy. Renal: Patient does not have known baseline CKD. She is not on an ACEI/ARB at present.     3)Dyslipidemia:  -LDL at acceptable level   Medication Continue atorvastatin 80 mg daily       F/U in 3 months    Signed electronically by: Mack Guise, MD  [facility]  [facility] [place], [place] [address] Phone: [phone] FAX: [phone]   CC: Lind Covert, [place] [address] Phone: [phone]  Fax: [phone]  Return to Endocrinology clinic as below: Future Appointments  Date Time Provider [place]  09/29/2021  9:30 AM Drema Balzarine, PTA [facility]  10/01/2021  8:00 AM Frazier Butt, PT [facility]  10/06/2021  9:30 AM Drema Balzarine, PTA [facility]  10/08/2021  8:00 AM Frazier Butt, PT [facility]  10/14/2021 12:10 PM GI-BCG MM 2 GI-BCGMM GI-BREAST CE  10/21/2021  8:00 AM Kathrine Cords, OT [facility]  11/26/2021  9:00 AM Clydell Hakim, RD [place] NDM  12/01/2021 10:45 AM Suzzanne Cloud, NP [facility]NA None  12/08/2021  9:15 AM Marzetta Board, DPM [facility]  01/18/2022 10:00 AM Courtney Heys, MD CPR-PRMA CPR

## 2021-09-29 ENCOUNTER — Ambulatory Visit: Payer: Medicare Other | Admitting: Physical Therapy

## 2021-09-29 ENCOUNTER — Encounter: Payer: Self-pay | Admitting: Internal Medicine

## 2021-09-29 DIAGNOSIS — M7711 Lateral epicondylitis, right elbow: Secondary | ICD-10-CM | POA: Diagnosis not present

## 2021-09-29 DIAGNOSIS — M6281 Muscle weakness (generalized): Secondary | ICD-10-CM | POA: Diagnosis not present

## 2021-09-29 DIAGNOSIS — R2681 Unsteadiness on feet: Secondary | ICD-10-CM

## 2021-09-29 DIAGNOSIS — R2689 Other abnormalities of gait and mobility: Secondary | ICD-10-CM

## 2021-09-29 DIAGNOSIS — M7712 Lateral epicondylitis, left elbow: Secondary | ICD-10-CM | POA: Diagnosis not present

## 2021-09-29 DIAGNOSIS — R278 Other lack of coordination: Secondary | ICD-10-CM | POA: Diagnosis not present

## 2021-09-29 DIAGNOSIS — R270 Ataxia, unspecified: Secondary | ICD-10-CM | POA: Diagnosis not present

## 2021-09-30 ENCOUNTER — Encounter: Payer: Self-pay | Admitting: Physical Therapy

## 2021-09-30 ENCOUNTER — Ambulatory Visit: Payer: Medicare Other

## 2021-09-30 NOTE — Therapy (Signed)
OUTPATIENT PHYSICAL THERAPY NEURO TREATMENT   Patient Name: Joyce Brown MRN: [id number] DOB:November 12, 1950, 71 y.o., female Today's Date: 09/30/2021  PCP: Talbert Cage, MD REFERRING PROVIDER: Marcial Pacas, MD     END OF SESSION:   PT End of Session - 09/30/21 1228     Visit Number 19    Number of Visits 30    Date for PT Re-Evaluation 10/28/21    Authorization Type Medicare/AARP    Progress Note Due on Visit 44    PT Start Time 0930    PT Stop Time 1013    PT Time Calculation (min) 43 min    Equipment Utilized During Treatment Other (comment)   large bar bell, aquatic cuffs, aquatic weights   Activity Tolerance Patient tolerated treatment well    Behavior During Therapy WFL for tasks assessed/performed                    Past Medical History:  Diagnosis Date   Cerebellar ataxia ([place])    Diabetes mellitus without complication ([place])    Diabetic neuropathy ([place]) 12/09/2020   Foot fracture 2022   Gait abnormality 11/07/2018   Hyperlipidemia    Pulmonary embolism ([place])    Sleep apnea    Spinal stenosis    Past Surgical History:  Procedure Laterality Date   ABDOMINAL HYSTERECTOMY     ADENOIDECTOMY     ANKLE SURGERY Left    BILATERAL CARPAL TUNNEL RELEASE     BREAST BIOPSY Left 2019   benign   ELBOW SURGERY     GASTRIC BYPASS  2014   HAND NEUROPLASTY     radio denervation  05/21/2020   TONSILLECTOMY     Patient Active Problem List   Diagnosis Date Noted   H/O bariatric surgery 06/29/2021   Bilateral tennis elbow 06/15/2021   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   Type 1 diabetes mellitus with diabetic polyneuropathy ([place]) 12/25/2020   Diabetic neuropathy ([place]) 12/09/2020   Myofascial pain 09/12/2020   Wheelchair dependence 09/12/2020   Dyslipidemia 06/19/2020   Spondylosis without myelopathy or radiculopathy, lumbar region 04/27/2020   Sensorineural hearing loss (SNHL) of both ears 08/21/2019   Tinnitus, bilateral 06/27/2019   FH:  ovarian cancer in first degree relative 11/16/2018   Hypercholesteremia 11/15/2018   Gait abnormality 11/07/2018   Pain of left hip joint 05/18/2018   Chronic midline low back pain with bilateral sciatica 05/18/2018   Breast calcifications 12/28/2017   Fibromyalgia 12/06/2017   History of gastric bypass 12/06/2017   Spinal stenosis 12/06/2017   Asthma 12/06/2017   Cerebellar ataxia ([place])    Type I diabetes mellitus with complication, uncontrolled    Serum albumin decreased 11/10/2017   Vitamin B6 deficiency 11/10/2017   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   Hypoglycemia due to type 1 diabetes mellitus ([place]) 06/20/2016   Hyperparathyroidism ([place]) 10/29/2014   Increased PTH level 10/01/2014   Obstructive sleep apnea on CPAP 10/01/2014   Non-organic enuresis 05/28/2014   Urge incontinence of urine 05/28/2014   History of vaginal hysterectomy 05/24/2014   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   Pain of lumbar facet joint 04/11/2013   Mild persistent asthma without complication 02/54/2706   Carpal tunnel syndrome 23/76/2831   Lichen planus 51/76/1607   Adjustment disorder with mixed anxiety and depressed mood 05/11/2012   Degeneration of lumbar intervertebral disc 04/26/2012   Pain in joint involving ankle and foot 04/26/2012   Osteoarthritis of hand 05/24/2011   Leukopenia 09/25/2009  ONSET DATE: 07/06/2021 (MD referral); initial symptoms 2007  REFERRING DIAG: G11.9 (ICD-10-CM) - Cerebellar ataxia ([place]) R26.9 (ICD-10-CM) - Gait abnormality   THERAPY DIAG:  Muscle weakness (generalized)  Unsteadiness on feet  Other abnormalities of gait and mobility  PERTINENT HISTORY: Has Cerebellar atrophy with ataxia and also chronic low back pain s/p radiofrequency ablation 3/22; Has had trigger point injections for L tennis elbow. Has has Myofascial pain and fibromyalgia.  lumbar spondylosis, chronic LBP  PMH:  DM, neuropathy, closed foot fracture fall of 2022, HLD,  PE, spinal stenosis, tennis elbow                                                                                                                                                                                            PRECAUTIONS: Fall   -hx of Type 1 DM, carries glucose tablets with her; follows the "Rule of 15"  PATIENT GOALS: Pt's goals for therapy are to increase strength and mobility.     SUBJECTIVE: No new complaints. No falls or pain to report. Had a good weekend.     PAIN:  Are you having pain? No    TODAY'S TREATMENT: Aquatic therapy at [facility] - pool temp 90 degrees  Patient seen for aquatic therapy today.  Treatment took place in water 3.5-4.5 feet deep depending upon activity.  Pt entered/exited pool via stairs with bil rails, step to pattern with min guard to descend and up to min assist for last 2 steps with ascending.   Use of large bar bell for gait forward from stairs till about 4.0 to 4.3 foot water depth. Use of large bar bell for gait 18 feet across pool with min/mod assist for balance as follows:  cues with all gait for hip extension/knee extension to improve posture Forward x 8 laps with cues for posture/hip extension, step placement and weight shifting Backward x 8 laps with cues for posture and step length Side stepping left<>right x 5 laps toward each side with cues for posture and step length.    At wall in ~4.0 foot water depth with UE support on pool wall: facing the wall pt performed the following ex's with cues on form/technique, min guard assist for safety, aquatic cuffs to bil ankles. Heel toe raises x 15 reps Alternating slow high knee marching  15 reps each side Alternating HS curls x 15 reps each side Alternating hip abd/add x 15 reps each side Alternating hip extension x 15 reps each side   Aquatic cuffs removed and use of large bar bell for gait from deeper end to bench in shallow end of pool with min/mod  assist.   Seated back on bench  with 2# ankle weights to bil LE's Long arc quads x 15 reps Hip abd/add with LE's in extension x 15 reps Scissor kicks x 15 reps   Seated at edge of bench with feet on floor, use of single bar bells for the following activities: Shoulder horizontal abd/add at water level for 15 reps Holding bar bells out in front- alternating lowering bar bell down under water to knee<>back up to water surface for 15 reps each side with assist at knees to stabilize pt/keep her from floating away  Use of wall/large bar bell for gait from bench in water to stairs to exit pool with min/mod assist.    Pt requires buoyancy of water for support for reduced fall risk with gait training and balance exercises with minimal UE support; exercises able to be performed safely in water without the risk of fall compared to those same exercises performed on land;  viscosity of water needed for resistance for strengthening.  Current of water provides perturbations for challenging static & dynamic standing balance.        HOME EXERCISE PROGRAM: Access Code: [email or web address] URL: [email or web address] Date: 08/24/2021 Prepared by: [facility]  Exercises - Seated Gluteal Sets  - 1 x daily - 7 x weekly - 2 sets - 10 reps - 3 sec hold - Seated Active Hip Flexion  - 1 x daily - 7 x weekly - 2 sets - 10 reps - 5 sec hold - Seated Shoulder Row with Anchored Resistance  - 1 x daily - 5 x weekly - 2 sets - 10 reps - Seated Shoulder Extension and Scapular Retraction with Resistance  - 1 x daily - 5 x weekly - 2 sets - 10 reps - Modified Thomas Stretch  - 1 x daily - 5 x weekly - 2 sets - 30 sec hold - Sit to Stand with Counter Support  - 1 x daily - 5 x weekly - 2 sets - 5 reps - Lower Trunk Rotations  - 1-2 x daily - 7 x weekly - 1 sets - 5-10 reps - Hooklying Isometric Clamshell  - 1 x daily - 5 x weekly - 1-2 sets - 10 reps - Seated Heel Raise  - 1 x daily - 5 x weekly - 1-2 sets - 10  reps - Standing Hip Abduction with Counter Support  - 1 x daily - 5 x weekly - 2 sets - 10 reps     GOALS:  FOR RECERT 2/72/5366: SHORT TERM GOALS: Target date: 09/30/2021  Pt will be independent with progression of HEP for improved strength, balance, gait and progression to use of apartment complex pool for exercise. Baseline: Goal status: IN PROGRESS  2.  Pt will improve 5x sit<>stand to less with no LOB and mod independently, to demonstrate improved functional strength and transfer efficiency. Baseline: decreased eccentric control; supervision Goal status: IN PROGRESS  LONG TERM GOALS: Target date: 10/28/2021  Pt will be independent with final progression of HEP for improved strength, balance, transfers, and gait. Baseline:  Goal status: IN PROGRESS  2.  Pt will verbalize understanding of fall prevention in home environment.   Baseline:  Goal Status:  IN PROGRESS  3.  Pt will improve TUG score to less than or equal to 35 sec for decreased fall risk. Baseline:  43.82 sec 09/02/2021 Goal status:  IN PROGRESS  4.  Berg Balance test to improve to at  least 33/56 for decreased fall risk. Baseline: 19/56 07/22/21 at start of care; 28/56 on 08/19/21 Goal status: IN PROGRESS  5.  Pt will verbalize plans for ongoing community fitness, including aquatic fitness options, upon d/c from PT. Baseline:  Goal status: IN PROGRESS   CLINICAL IMPRESSION: Skilled PT session continued to focus on gait, strengthening and balance in the aquatic setting with no issues noted or reported. The pt appears to be making steady progress and should benefit from continued PT to progress toward unmet goals.   OBJECTIVE IMPAIRMENTS Abnormal gait, decreased activity tolerance, decreased balance, decreased endurance, decreased mobility, difficulty walking, decreased strength, and postural dysfunction.   ACTIVITY LIMITATIONS community activity and community fitness .   PERSONAL FACTORS  PMH:  Cerebellar  atrophy with ataxia, chronic low back pain s/p radiofrequency ablation 3/22; trigger point injections for L tennis elbow; myofascial pain and fibromyalgia.  lumbar spondylosis.  DM, neuropathy, closed foot fracture fall of 2022, HLD, PE, spinal stenosis, are also affecting patient's functional outcome.    REHAB POTENTIAL: Good  CLINICAL DECISION MAKING: Evolving/moderate complexity  EVALUATION COMPLEXITY: Moderate  PLAN: PT FREQUENCY: 2x/week  PT DURATION: 8 weeks  (per recert 08/19/3014)  PLANNED INTERVENTIONS: Therapeutic exercises, Therapeutic activity, Neuromuscular re-education, Balance training, Gait training, Patient/Family education, Joint mobilization, DME instructions, Aquatic Therapy, and Manual therapy  PLAN FOR NEXT SESSION: .  Check STGs/?add more appts.  Continue to work on short distance gait, progression of standing exercises, stair negotiation for when pt can use pool at her apartment complex. Continue to progress lower extremity strength and standing balance. Core stability, posture exercises. Standing exercises at counter-head turns/nods, lateral or A/P weightshifting, reaching tasks.     Willow Ora, PTA, [facility] [address], [place], [place] [address] [phone] 09/30/21, 12:28 PM

## 2021-10-01 ENCOUNTER — Ambulatory Visit: Payer: Medicare Other | Admitting: Physical Therapy

## 2021-10-01 ENCOUNTER — Encounter: Payer: Self-pay | Admitting: Physical Therapy

## 2021-10-01 ENCOUNTER — Encounter: Payer: Self-pay | Admitting: Internal Medicine

## 2021-10-01 DIAGNOSIS — R2689 Other abnormalities of gait and mobility: Secondary | ICD-10-CM

## 2021-10-01 DIAGNOSIS — M6281 Muscle weakness (generalized): Secondary | ICD-10-CM | POA: Diagnosis not present

## 2021-10-01 DIAGNOSIS — R278 Other lack of coordination: Secondary | ICD-10-CM | POA: Diagnosis not present

## 2021-10-01 DIAGNOSIS — R2681 Unsteadiness on feet: Secondary | ICD-10-CM | POA: Diagnosis not present

## 2021-10-01 DIAGNOSIS — R270 Ataxia, unspecified: Secondary | ICD-10-CM | POA: Diagnosis not present

## 2021-10-01 DIAGNOSIS — M7711 Lateral epicondylitis, right elbow: Secondary | ICD-10-CM | POA: Diagnosis not present

## 2021-10-01 DIAGNOSIS — M7712 Lateral epicondylitis, left elbow: Secondary | ICD-10-CM | POA: Diagnosis not present

## 2021-10-01 NOTE — Patient Instructions (Signed)
Access Code: [email or web address] URL: [email or web address] Date: 10/01/2021 Prepared by: [facility]  Exercises - Seated Gluteal Sets  - 1 x daily - 7 x weekly - 2 sets - 10 reps - 3 sec hold - Seated Active Hip Flexion  - 1 x daily - 7 x weekly - 2 sets - 10 reps - 5 sec hold - Seated Shoulder Row with Anchored Resistance  - 1 x daily - 5 x weekly - 2 sets - 10 reps - Seated Shoulder Extension and Scapular Retraction with Resistance  - 1 x daily - 5 x weekly - 2 sets - 10 reps - Modified Thomas Stretch  - 1 x daily - 5 x weekly - 2 sets - 30 sec hold - Sit to Stand with Counter Support  - 1 x daily - 5 x weekly - 2 sets - 5 reps - Lower Trunk Rotations  - 1-2 x daily - 7 x weekly - 1 sets - 5-10 reps - Hooklying Isometric Clamshell  - 1 x daily - 5 x weekly - 1-2 sets - 10 reps - Seated Heel Raise  - 1 x daily - 5 x weekly - 1-2 sets - 10 reps - Standing Hip Abduction with Counter Support  - 1 x daily - 5 x weekly - 2 sets - 10 reps - Staggered Stance Forward Backward Weight Shift with Counter Support  - 1 x daily - 5 x weekly - 2 sets - 10 reps

## 2021-10-01 NOTE — Therapy (Signed)
OUTPATIENT PHYSICAL THERAPY NEURO TREATMENT/PROGRESS NOTE   Patient Name: Joyce Brown MRN: [id number] DOB:1950/11/07, 71 y.o., female Today's Date: 10/01/2021  PCP: Talbert Cage, MD REFERRING PROVIDER: Marcial Pacas, MD   Progress Note Reporting Period 08/24/2021 to 09/2021  See note below for Objective Data and Assessment of Progress/Goals.      END OF SESSION:   PT End of Session - 10/01/21 0740     Visit Number 20    Number of Visits 30    Date for PT Re-Evaluation 10/28/21    Authorization Type Medicare/AARP    Progress Note Due on Visit 56    PT Start Time 0802    PT Stop Time 0846    PT Time Calculation (min) 44 min    Equipment Utilized During Treatment Other (comment)   large bar bell, aquatic cuffs, aquatic weights   Activity Tolerance Patient tolerated treatment well    Behavior During Therapy WFL for tasks assessed/performed                    Past Medical History:  Diagnosis Date   Cerebellar ataxia ([place])    Diabetes mellitus without complication ([place])    Diabetic neuropathy ([place]) 12/09/2020   Foot fracture 2022   Gait abnormality 11/07/2018   Hyperlipidemia    Pulmonary embolism ([place])    Sleep apnea    Spinal stenosis    Past Surgical History:  Procedure Laterality Date   ABDOMINAL HYSTERECTOMY     ADENOIDECTOMY     ANKLE SURGERY Left    BILATERAL CARPAL TUNNEL RELEASE     BREAST BIOPSY Left 2019   benign   ELBOW SURGERY     GASTRIC BYPASS  2014   HAND NEUROPLASTY     radio denervation  05/21/2020   TONSILLECTOMY     Patient Active Problem List   Diagnosis Date Noted   H/O bariatric surgery 06/29/2021   Bilateral tennis elbow 06/15/2021   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   Type 1 diabetes mellitus with diabetic polyneuropathy ([place]) 12/25/2020   Diabetic neuropathy ([place]) 12/09/2020   Myofascial pain 09/12/2020   Wheelchair dependence 09/12/2020   Dyslipidemia 06/19/2020   Spondylosis without myelopathy or  radiculopathy, lumbar region 04/27/2020   Sensorineural hearing loss (SNHL) of both ears 08/21/2019   Tinnitus, bilateral 06/27/2019   FH: ovarian cancer in first degree relative 11/16/2018   Hypercholesteremia 11/15/2018   Gait abnormality 11/07/2018   Pain of left hip joint 05/18/2018   Chronic midline low back pain with bilateral sciatica 05/18/2018   Breast calcifications 12/28/2017   Fibromyalgia 12/06/2017   History of gastric bypass 12/06/2017   Spinal stenosis 12/06/2017   Asthma 12/06/2017   Cerebellar ataxia ([place])    Type I diabetes mellitus with complication, uncontrolled    Serum albumin decreased 11/10/2017   Vitamin B6 deficiency 11/10/2017   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   Hypoglycemia due to type 1 diabetes mellitus ([place]) 06/20/2016   Hyperparathyroidism ([place]) 10/29/2014   Increased PTH level 10/01/2014   Obstructive sleep apnea on CPAP 10/01/2014   Non-organic enuresis 05/28/2014   Urge incontinence of urine 05/28/2014   History of vaginal hysterectomy 05/24/2014   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   Pain of lumbar facet joint 04/11/2013   Mild persistent asthma without complication 58/85/0277   Carpal tunnel syndrome 41/28/7867   Lichen planus 67/20/9470   Adjustment disorder with mixed anxiety and depressed mood 05/11/2012   Degeneration of lumbar intervertebral disc 04/26/2012  Pain in joint involving ankle and foot 04/26/2012   Osteoarthritis of hand 05/24/2011   Leukopenia 09/25/2009    ONSET DATE: 07/06/2021 (MD referral); initial symptoms 2007  REFERRING DIAG: G11.9 (ICD-10-CM) - Cerebellar ataxia ([place]) R26.9 (ICD-10-CM) - Gait abnormality   THERAPY DIAG:  Unsteadiness on feet  Muscle weakness (generalized)  Other abnormalities of gait and mobility  PERTINENT HISTORY: Has Cerebellar atrophy with ataxia and also chronic low back pain s/p radiofrequency ablation 3/22; Has had trigger point injections for L tennis elbow.  Has has Myofascial pain and fibromyalgia.  lumbar spondylosis, chronic LBP  PMH:  DM, neuropathy, closed foot fracture fall of 2022, HLD, PE, spinal stenosis, tennis elbow                                                                                                                                                                                            PRECAUTIONS: Fall   -hx of Type 1 DM, carries glucose tablets with her; follows the "Rule of 15"  PATIENT GOALS: Pt's goals for therapy are to increase strength and mobility.     SUBJECTIVE: Feel more confident with going down the steps in the pool, but I want to feel more comfortable to do with one rail, like the pool in my complex.  Walking 6-10 ft at home.     PAIN:  Are you having pain? No    TODAY'S TREATMENT:  TODAY'S TREATMENT: 10/01/2021 Activity Comments  Sit<>stand, x 7 reps, then sit<>stand x 5 reps Initial cues for nose over toes, to lessen retropulsion, better with practice after cues.  Pt reports improved control of movement with this technique  Partial sit<>stand from mat surface, 10 reps with BUE support To address control with initiating sit<>Stand  Stand>sit at elevated mat table, x 10 reps To address eccentric control stand>sit  Reviewed standing hip abduction as part of HEP, with pt return demo understanding, x 10 reps, then x 5 reps each side with step taps Return demo understanding  Stagger stance forward weigthshifting at counter, to promote push-off with gait pattern, x 10 reps   Short distance gait between activities in session 10-20 ft, 3 reps with RW with min guard   TUG score with RW:  55.97 sec  In smaller space today (more narrow), takes more time than previous check  NuStep, Level 6, 5 minutes, 4 extremities for improved lower extremity strength Keeps SPM 50-60    PATIENT EDUCATION: Education details: Updated HEP; PT progress and POC Person educated: Patient Education method: Explanation,  Demonstration, and Handouts Education comprehension: verbalized understanding and returned demonstration   Access Code: [email or web address]  URL: [email or web address] Date: 10/01/2021-updated 10/01/21 Prepared by: [facility]  Exercises - Seated Gluteal Sets  - 1 x daily - 7 x weekly - 2 sets - 10 reps - 3 sec hold - Seated Active Hip Flexion  - 1 x daily - 7 x weekly - 2 sets - 10 reps - 5 sec hold - Seated Shoulder Row with Anchored Resistance  - 1 x daily - 5 x weekly - 2 sets - 10 reps - Seated Shoulder Extension and Scapular Retraction with Resistance  - 1 x daily - 5 x weekly - 2 sets - 10 reps - Modified Thomas Stretch  - 1 x daily - 5 x weekly - 2 sets - 30 sec hold - Sit to Stand with Counter Support  - 1 x daily - 5 x weekly - 2 sets - 5 reps - Lower Trunk Rotations  - 1-2 x daily - 7 x weekly - 1 sets - 5-10 reps - Hooklying Isometric Clamshell  - 1 x daily - 5 x weekly - 1-2 sets - 10 reps - Seated Heel Raise  - 1 x daily - 5 x weekly - 1-2 sets - 10 reps - Standing Hip Abduction with Counter Support  - 1 x daily - 5 x weekly - 2 sets - 10 reps - Staggered Stance Forward Backward Weight Shift with Counter Support  - 1 x daily - 5 x weekly - 2 sets - 10 reps    GOALS:  FOR RECERT 07/18/3974: SHORT TERM GOALS: Target date: 09/30/2021  Pt will be independent with progression of HEP for improved strength, balance, gait and progression to use of apartment complex pool for exercise. Baseline: Goal status: GOAL MET, 10/01/2021  2.  Pt will improve 5x sit<>stand to less with no LOB and mod independently, to demonstrate improved functional strength and transfer efficiency. Baseline: decreased eccentric control; supervision Goal status: GOAL MET, 10/01/21  LONG TERM GOALS: Target date: 10/28/2021  Pt will be independent with final progression of HEP for improved strength, balance, transfers, and gait. Baseline:  Goal status: IN  PROGRESS  2.  Pt will verbalize understanding of fall prevention in home environment.   Baseline:  Goal Status:  IN PROGRESS  3.  Pt will improve TUG score to less than or equal to 35 sec for decreased fall risk. Baseline:  43.82 sec 09/02/2021; 10/01/21 55.97 sec with RW Goal status:  IN PROGRESS  4.  Berg Balance test to improve to at least 33/56 for decreased fall risk. Baseline: 19/56 07/22/21 at start of care; 28/56 on 08/19/21 Goal status: IN PROGRESS  5.  Pt will verbalize plans for ongoing community fitness, including aquatic fitness options, upon d/c from PT. Baseline:  Goal status: IN PROGRESS   CLINICAL IMPRESSION: Subjectively, pt reports improved control with going down steps at aquatic therapy, consistently walking home 6-10 ft.  Assessed STGs this visit, with pt meeting 2 of 2 STGs.  Pt is demonstrating improved control of transfers with attention to task, cues for forward lean to initiate.  TUG score:  >55 sec today, which is increased from last check (performed at end of session, so pt may have been fatigued.  Overall, functionally, she is improving standing and short distance gait in the home.  She remains at fall risk and she will continue to benefit from continued skilled PT towards LTGs for further balance, strength, gait safety.     OBJECTIVE IMPAIRMENTS Abnormal gait, decreased  activity tolerance, decreased balance, decreased endurance, decreased mobility, difficulty walking, decreased strength, and postural dysfunction.   ACTIVITY LIMITATIONS community activity and community fitness .   PERSONAL FACTORS  PMH:  Cerebellar atrophy with ataxia, chronic low back pain s/p radiofrequency ablation 3/22; trigger point injections for L tennis elbow; myofascial pain and fibromyalgia.  lumbar spondylosis.  DM, neuropathy, closed foot fracture fall of 2022, HLD, PE, spinal stenosis, are also affecting patient's functional outcome.    REHAB POTENTIAL: Good  CLINICAL DECISION  MAKING: Evolving/moderate complexity  EVALUATION COMPLEXITY: Moderate  PLAN: PT FREQUENCY: 2x/week  PT DURATION: 8 weeks  (per recert 0/86/5784)  PLANNED INTERVENTIONS: Therapeutic exercises, Therapeutic activity, Neuromuscular re-education, Balance training, Gait training, Patient/Family education, Joint mobilization, DME instructions, Aquatic Therapy, and Manual therapy  PLAN FOR NEXT SESSION: .  Continue to work towards LTGs-review HEP addition from today, short distance gait, progression of standing exercises, stair negotiation for when pt can use pool at her apartment complex. Continue to progress lower extremity strength and standing balance. Standing exercises at counter-head turns/nods, lateral or A/P weightshifting, reaching tasks.     Mady Haagensen, PT 10/01/21 1:02 PM Phone: [phone] Fax: [phone]  [facility] at [facility] [place], [address] [place], [place] [address] Phone # [phone] Fax # [phone]

## 2021-10-06 ENCOUNTER — Ambulatory Visit: Payer: Medicare Other | Admitting: Physical Therapy

## 2021-10-06 ENCOUNTER — Encounter: Payer: Self-pay | Admitting: Physical Therapy

## 2021-10-06 DIAGNOSIS — R2681 Unsteadiness on feet: Secondary | ICD-10-CM | POA: Diagnosis not present

## 2021-10-06 DIAGNOSIS — R278 Other lack of coordination: Secondary | ICD-10-CM | POA: Diagnosis not present

## 2021-10-06 DIAGNOSIS — R270 Ataxia, unspecified: Secondary | ICD-10-CM | POA: Diagnosis not present

## 2021-10-06 DIAGNOSIS — M6281 Muscle weakness (generalized): Secondary | ICD-10-CM | POA: Diagnosis not present

## 2021-10-06 DIAGNOSIS — M7711 Lateral epicondylitis, right elbow: Secondary | ICD-10-CM | POA: Diagnosis not present

## 2021-10-06 DIAGNOSIS — M7712 Lateral epicondylitis, left elbow: Secondary | ICD-10-CM | POA: Diagnosis not present

## 2021-10-06 DIAGNOSIS — R2689 Other abnormalities of gait and mobility: Secondary | ICD-10-CM

## 2021-10-06 NOTE — Therapy (Unsigned)
OUTPATIENT PHYSICAL THERAPY NEURO TREATMENT/PROGRESS NOTE   Patient Name: Joyce Brown MRN: [id number] DOB:[birth year], 71 y.o., female Today's Date: 10/06/2021  PCP: Talbert Cage, MD REFERRING PROVIDER: Marcial Pacas, MD   Progress Note Reporting Period 08/24/2021 to 09/2021  See note below for Objective Data and Assessment of Progress/Goals.      END OF SESSION:   PT End of Session - 10/06/21 2013     Visit Number 21    Number of Visits 30    Date for PT Re-Evaluation 10/28/21    Authorization Type Medicare/AARP    Progress Note Due on Visit 30    PT Start Time 0930    PT Stop Time 1013    PT Time Calculation (min) 43 min    Equipment Utilized During Treatment Other (comment)   large bar bell, single bar bells, pool noodles   Activity Tolerance Patient tolerated treatment well    Behavior During Therapy WFL for tasks assessed/performed                    Past Medical History:  Diagnosis Date   Cerebellar ataxia ([place])    Diabetes mellitus without complication ([place])    Diabetic neuropathy ([place]) 12/09/2020   Foot fracture 2022   Gait abnormality 11/07/2018   Hyperlipidemia    Pulmonary embolism ([place])    Sleep apnea    Spinal stenosis    Past Surgical History:  Procedure Laterality Date   ABDOMINAL HYSTERECTOMY     ADENOIDECTOMY     ANKLE SURGERY Left    BILATERAL CARPAL TUNNEL RELEASE     BREAST BIOPSY Left 2019   benign   ELBOW SURGERY     GASTRIC BYPASS  2014   HAND NEUROPLASTY     radio denervation  05/21/2020   TONSILLECTOMY     Patient Active Problem List   Diagnosis Date Noted   H/O bariatric surgery 06/29/2021   Bilateral tennis elbow 06/15/2021   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   Type 1 diabetes mellitus with diabetic polyneuropathy ([place]) 12/25/2020   Diabetic neuropathy ([place]) 12/09/2020   Myofascial pain 09/12/2020   Wheelchair dependence 09/12/2020   Dyslipidemia 06/19/2020   Spondylosis without myelopathy or  radiculopathy, lumbar region 04/27/2020   Sensorineural hearing loss (SNHL) of both ears 08/21/2019   Tinnitus, bilateral 06/27/2019   FH: ovarian cancer in first degree relative 11/16/2018   Hypercholesteremia 11/15/2018   Gait abnormality 11/07/2018   Pain of left hip joint 05/18/2018   Chronic midline low back pain with bilateral sciatica 05/18/2018   Breast calcifications 12/28/2017   Fibromyalgia 12/06/2017   History of gastric bypass 12/06/2017   Spinal stenosis 12/06/2017   Asthma 12/06/2017   Cerebellar ataxia ([place])    Type I diabetes mellitus with complication, uncontrolled    Serum albumin decreased 11/10/2017   Vitamin B6 deficiency 11/10/2017   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   Hypoglycemia due to type 1 diabetes mellitus ([place]) 06/20/2016   Hyperparathyroidism ([place]) 10/29/2014   Increased PTH level 10/01/2014   Obstructive sleep apnea on CPAP 10/01/2014   Non-organic enuresis 05/28/2014   Urge incontinence of urine 05/28/2014   History of vaginal hysterectomy 05/24/2014   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   Pain of lumbar facet joint 04/11/2013   Mild persistent asthma without complication 95/62/1308   Carpal tunnel syndrome 65/78/4696   Lichen planus 29/52/8413   Adjustment disorder with mixed anxiety and depressed mood 05/11/2012   Degeneration of lumbar intervertebral disc 04/26/2012  Pain in joint involving ankle and foot 04/26/2012   Osteoarthritis of hand 05/24/2011   Leukopenia 09/25/2009    ONSET DATE: 07/06/2021 (MD referral); initial symptoms 2007  REFERRING DIAG: G11.9 (ICD-10-CM) - Cerebellar ataxia ([place]) R26.9 (ICD-10-CM) - Gait abnormality   THERAPY DIAG:  Unsteadiness on feet  Muscle weakness (generalized)  Other abnormalities of gait and mobility  PERTINENT HISTORY: Has Cerebellar atrophy with ataxia and also chronic low back pain s/p radiofrequency ablation 3/22; Has had trigger point injections for L tennis elbow.  Has has Myofascial pain and fibromyalgia.  lumbar spondylosis, chronic LBP  PMH:  DM, neuropathy, closed foot fracture fall of 2022, HLD, PE, spinal stenosis, tennis elbow                                                                                                                                                                                            PRECAUTIONS: Fall   -hx of Type 1 DM, carries glucose tablets with her; follows the "Rule of 15"  PATIENT GOALS: Pt's goals for therapy are to increase strength and mobility.     SUBJECTIVE: No new complaints. No falls or pain to report.     PAIN:  Are you having pain? No    TODAY'S TREATMENT: Aquatic therapy at [facility] - pool temp 90 degrees  Patient seen for aquatic therapy today.  Treatment took place in water 3.5-4.5 feet deep depending upon activity.  Pt entered/exited pool via single middle rail with step to pattern and CGA for safety (pt's home pool only has one single rail in middle of stairs, therefore began to practice with single rail).          Pt requires buoyancy of water for support for reduced fall risk with gait training and balance exercises with minimal UE support; exercises able to be performed safely in water without the risk of fall compared to those same exercises performed on land;  viscosity of water needed for resistance for strengthening.  Current of water provides perturbations for challenging static & dynamic standing balance.          PATIENT EDUCATION: Education details: Updated HEP; PT progress and POC Person educated: Patient Education method: Explanation, Demonstration, and Handouts Education comprehension: verbalized understanding and returned demonstration    HOME EXERCISE PROGRAM: Access Code: [email or web address] URL: [email or web address] Date: 10/01/2021-updated 10/01/21 Prepared by: [facility]  Exercises - Seated Gluteal Sets  - 1 x  daily - 7 x weekly - 2 sets - 10 reps - 3 sec hold - Seated Active Hip Flexion  - 1 x daily -  7 x weekly - 2 sets - 10 reps - 5 sec hold - Seated Shoulder Row with Anchored Resistance  - 1 x daily - 5 x weekly - 2 sets - 10 reps - Seated Shoulder Extension and Scapular Retraction with Resistance  - 1 x daily - 5 x weekly - 2 sets - 10 reps - Modified Thomas Stretch  - 1 x daily - 5 x weekly - 2 sets - 30 sec hold - Sit to Stand with Counter Support  - 1 x daily - 5 x weekly - 2 sets - 5 reps - Lower Trunk Rotations  - 1-2 x daily - 7 x weekly - 1 sets - 5-10 reps - Hooklying Isometric Clamshell  - 1 x daily - 5 x weekly - 1-2 sets - 10 reps - Seated Heel Raise  - 1 x daily - 5 x weekly - 1-2 sets - 10 reps - Standing Hip Abduction with Counter Support  - 1 x daily - 5 x weekly - 2 sets - 10 reps - Staggered Stance Forward Backward Weight Shift with Counter Support  - 1 x daily - 5 x weekly - 2 sets - 10 reps    GOALS:  FOR RECERT 2/33/0076: SHORT TERM GOALS: Target date: 09/30/2021  Pt will be independent with progression of HEP for improved strength, balance, gait and progression to use of apartment complex pool for exercise. Baseline: Goal status: GOAL MET, 10/01/2021  2.  Pt will improve 5x sit<>stand to less with no LOB and mod independently, to demonstrate improved functional strength and transfer efficiency. Baseline: decreased eccentric control; supervision Goal status: GOAL MET, 10/01/21  LONG TERM GOALS: Target date: 10/28/2021  Pt will be independent with final progression of HEP for improved strength, balance, transfers, and gait. Baseline:  Goal status: IN PROGRESS  2.  Pt will verbalize understanding of fall prevention in home environment.   Baseline:  Goal Status:  IN PROGRESS  3.  Pt will improve TUG score to less than or equal to 35 sec for decreased fall risk. Baseline:  43.82 sec 09/02/2021; 10/01/21 55.97 sec with RW Goal status:  IN PROGRESS  4.  Berg Balance  test to improve to at least 33/56 for decreased fall risk. Baseline: 19/56 07/22/21 at start of care; 28/56 on 08/19/21 Goal status: IN PROGRESS  5.  Pt will verbalize plans for ongoing community fitness, including aquatic fitness options, upon d/c from PT. Baseline:  Goal status: IN PROGRESS   CLINICAL IMPRESSION:   OBJECTIVE IMPAIRMENTS Abnormal gait, decreased activity tolerance, decreased balance, decreased endurance, decreased mobility, difficulty walking, decreased strength, and postural dysfunction.   ACTIVITY LIMITATIONS community activity and community fitness .   PERSONAL FACTORS  PMH:  Cerebellar atrophy with ataxia, chronic low back pain s/p radiofrequency ablation 3/22; trigger point injections for L tennis elbow; myofascial pain and fibromyalgia.  lumbar spondylosis.  DM, neuropathy, closed foot fracture fall of 2022, HLD, PE, spinal stenosis, are also affecting patient's functional outcome.    REHAB POTENTIAL: Good  CLINICAL DECISION MAKING: Evolving/moderate complexity  EVALUATION COMPLEXITY: Moderate  PLAN: PT FREQUENCY: 2x/week  PT DURATION: 8 weeks  (per recert 05/10/3333)  PLANNED INTERVENTIONS: Therapeutic exercises, Therapeutic activity, Neuromuscular re-education, Balance training, Gait training, Patient/Family education, Joint mobilization, DME instructions, Aquatic Therapy, and Manual therapy  PLAN FOR NEXT SESSION: .  Continue to work towards LTGs-review HEP addition from today, short distance gait, progression of standing exercises, stair negotiation for when pt can use pool at  her apartment complex. Continue to progress lower extremity strength and standing balance. Standing exercises at counter-head turns/nods, lateral or A/P weightshifting, reaching tasks.     Willow Ora, PTA, [facility] [place] [address] [place], [place] [address] [phone] 10/06/21, 8:18 PM

## 2021-10-08 ENCOUNTER — Encounter: Payer: Self-pay | Admitting: Physical Therapy

## 2021-10-08 ENCOUNTER — Ambulatory Visit: Payer: Medicare Other | Admitting: Physical Therapy

## 2021-10-08 DIAGNOSIS — R270 Ataxia, unspecified: Secondary | ICD-10-CM | POA: Diagnosis not present

## 2021-10-08 DIAGNOSIS — M6281 Muscle weakness (generalized): Secondary | ICD-10-CM

## 2021-10-08 DIAGNOSIS — R2681 Unsteadiness on feet: Secondary | ICD-10-CM

## 2021-10-08 DIAGNOSIS — R278 Other lack of coordination: Secondary | ICD-10-CM | POA: Diagnosis not present

## 2021-10-08 DIAGNOSIS — M7711 Lateral epicondylitis, right elbow: Secondary | ICD-10-CM | POA: Diagnosis not present

## 2021-10-08 DIAGNOSIS — R2689 Other abnormalities of gait and mobility: Secondary | ICD-10-CM

## 2021-10-08 DIAGNOSIS — M7712 Lateral epicondylitis, left elbow: Secondary | ICD-10-CM | POA: Diagnosis not present

## 2021-10-08 NOTE — Therapy (Signed)
OUTPATIENT PHYSICAL THERAPY NEURO TREATMENT/PROGRESS NOTE   Patient Name: Joyce Brown MRN: [id number] DOB:01/27/1951, 71 y.o., female Today's Date: 10/08/2021  PCP: Talbert Cage, MD REFERRING PROVIDER: Marcial Pacas, MD   Progress Note Reporting Period 08/24/2021 to 09/2021  See note below for Objective Data and Assessment of Progress/Goals.      END OF SESSION:   PT End of Session - 10/08/21 0741     Visit Number 22    Number of Visits 30    Date for PT Re-Evaluation 10/28/21    Authorization Type Medicare/AARP    Progress Note Due on Visit 90    PT Start Time 0800    PT Stop Time 0842    PT Time Calculation (min) 42 min    Equipment Utilized During Treatment Other (comment)   large bar bell, single bar bells, pool noodles   Activity Tolerance Patient tolerated treatment well    Behavior During Therapy WFL for tasks assessed/performed                    Past Medical History:  Diagnosis Date   Cerebellar ataxia ([place])    Diabetes mellitus without complication ([place])    Diabetic neuropathy ([place]) 12/09/2020   Foot fracture 2022   Gait abnormality 11/07/2018   Hyperlipidemia    Pulmonary embolism ([place])    Sleep apnea    Spinal stenosis    Past Surgical History:  Procedure Laterality Date   ABDOMINAL HYSTERECTOMY     ADENOIDECTOMY     ANKLE SURGERY Left    BILATERAL CARPAL TUNNEL RELEASE     BREAST BIOPSY Left 2019   benign   ELBOW SURGERY     GASTRIC BYPASS  2014   HAND NEUROPLASTY     radio denervation  05/21/2020   TONSILLECTOMY     Patient Active Problem List   Diagnosis Date Noted   H/O bariatric surgery 06/29/2021   Bilateral tennis elbow 06/15/2021   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   Type 1 diabetes mellitus with diabetic polyneuropathy ([place]) 12/25/2020   Diabetic neuropathy ([place]) 12/09/2020   Myofascial pain 09/12/2020   Wheelchair dependence 09/12/2020   Dyslipidemia 06/19/2020   Spondylosis without myelopathy or  radiculopathy, lumbar region 04/27/2020   Sensorineural hearing loss (SNHL) of both ears 08/21/2019   Tinnitus, bilateral 06/27/2019   FH: ovarian cancer in first degree relative 11/16/2018   Hypercholesteremia 11/15/2018   Gait abnormality 11/07/2018   Pain of left hip joint 05/18/2018   Chronic midline low back pain with bilateral sciatica 05/18/2018   Breast calcifications 12/28/2017   Fibromyalgia 12/06/2017   History of gastric bypass 12/06/2017   Spinal stenosis 12/06/2017   Asthma 12/06/2017   Cerebellar ataxia ([place])    Type I diabetes mellitus with complication, uncontrolled    Serum albumin decreased 11/10/2017   Vitamin B6 deficiency 11/10/2017   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   Hypoglycemia due to type 1 diabetes mellitus ([place]) 06/20/2016   Hyperparathyroidism ([place]) 10/29/2014   Increased PTH level 10/01/2014   Obstructive sleep apnea on CPAP 10/01/2014   Non-organic enuresis 05/28/2014   Urge incontinence of urine 05/28/2014   History of vaginal hysterectomy 05/24/2014   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   Pain of lumbar facet joint 04/11/2013   Mild persistent asthma without complication 80/99/8338   Carpal tunnel syndrome 25/07/3974   Lichen planus 73/41/9379   Adjustment disorder with mixed anxiety and depressed mood 05/11/2012   Degeneration of lumbar intervertebral disc 04/26/2012  Pain in joint involving ankle and foot 04/26/2012   Osteoarthritis of hand 05/24/2011   Leukopenia 09/25/2009    ONSET DATE: 07/06/2021 (MD referral); initial symptoms 2007  REFERRING DIAG: G11.9 (ICD-10-CM) - Cerebellar ataxia ([place]) R26.9 (ICD-10-CM) - Gait abnormality   THERAPY DIAG:  Unsteadiness on feet  Muscle weakness (generalized)  Other abnormalities of gait and mobility  PERTINENT HISTORY: Has Cerebellar atrophy with ataxia and also chronic low back pain s/p radiofrequency ablation 3/22; Has had trigger point injections for L tennis elbow.  Has has Myofascial pain and fibromyalgia.  lumbar spondylosis, chronic LBP  PMH:  DM, neuropathy, closed foot fracture fall of 2022, HLD, PE, spinal stenosis, tennis elbow                                                                                                                                                                                            PRECAUTIONS: Fall   -hx of Type 1 DM, carries glucose tablets with her; follows the "Rule of 15"  PATIENT GOALS: Pt's goals for therapy are to increase strength and mobility.     SUBJECTIVE: Took pictures of the pool I have access to, so practiced how to get in and out.     PAIN:  Are you having pain? No    TODAY'S TREATMENT:   TODAY'S TREATMENT: 10/08/2021 Activity Comments  Sit<>stand, x 8 reps, then sit<>stand x 5 reps from mat surface Initial cues for nose over toes, scooting feet slightly posteriorly, to lessen retropulsion, better with practice after cues.  Pt reports improved control of movement with this technique  Partial sit<>stand from mat surface, 15 reps with BUE support To address control with initiating sit<>Stand  Stand>sit at elevated mat table, x 15 reps To address eccentric control stand>sit  Sidestepping along parallel bars, 3 reps R and L Self-corrects posture  Stagger stance forward weigthshifting at counter, to promote push-off with gait pattern, x 10 reps Review of HEP, with pt return demo understanding  Forward/back walking in parallel bars, 2 reps with BUE support, cues for foot placement   Gait between activities in session 56 ft, then 20 ft with RW with min guard   Forward step ups and step downs in parallel bars to 4" step x 3, to 6" step x 2 Using 1 handrail, BUEs, with min/mod A (mod A descending 6" step), simulating pool steps  NuStep, Level 6, 5 minutes, 4 extremities for improved lower extremity strength Keeps SPM >58-60    Access Code: [email or web address] URL: [email or web address] Date:  10/01/2021-updated 10/01/21 Prepared by: [facility]  Exercises -  Seated Gluteal Sets  - 1 x daily - 7 x weekly - 2 sets - 10 reps - 3 sec hold - Seated Active Hip Flexion  - 1 x daily - 7 x weekly - 2 sets - 10 reps - 5 sec hold - Seated Shoulder Row with Anchored Resistance  - 1 x daily - 5 x weekly - 2 sets - 10 reps - Seated Shoulder Extension and Scapular Retraction with Resistance  - 1 x daily - 5 x weekly - 2 sets - 10 reps - Modified Thomas Stretch  - 1 x daily - 5 x weekly - 2 sets - 30 sec hold - Sit to Stand with Counter Support  - 1 x daily - 5 x weekly - 2 sets - 5 reps - Lower Trunk Rotations  - 1-2 x daily - 7 x weekly - 1 sets - 5-10 reps - Hooklying Isometric Clamshell  - 1 x daily - 5 x weekly - 1-2 sets - 10 reps - Seated Heel Raise  - 1 x daily - 5 x weekly - 1-2 sets - 10 reps - Standing Hip Abduction with Counter Support  - 1 x daily - 5 x weekly - 2 sets - 10 reps - Staggered Stance Forward Backward Weight Shift with Counter Support  - 1 x daily - 5 x weekly - 2 sets - 10 reps    GOALS:  FOR RECERT 1/70/0174: SHORT TERM GOALS: Target date: 09/30/2021  Pt will be independent with progression of HEP for improved strength, balance, gait and progression to use of apartment complex pool for exercise. Baseline: Goal status: GOAL MET, 10/01/2021  2.  Pt will improve 5x sit<>stand to less with no LOB and mod independently, to demonstrate improved functional strength and transfer efficiency. Baseline: decreased eccentric control; supervision Goal status: GOAL MET, 10/01/21  LONG TERM GOALS: Target date: 10/28/2021  Pt will be independent with final progression of HEP for improved strength, balance, transfers, and gait. Baseline:  Goal status: IN PROGRESS  2.  Pt will verbalize understanding of fall prevention in home environment.   Baseline:  Goal Status:  IN PROGRESS  3.  Pt will improve TUG score to less than or equal to 35 sec  for decreased fall risk. Baseline:  43.82 sec 09/02/2021; 10/01/21 55.97 sec with RW Goal status:  IN PROGRESS  4.  Berg Balance test to improve to at least 33/56 for decreased fall risk. Baseline: 19/56 07/22/21 at start of care; 28/56 on 08/19/21 Goal status: IN PROGRESS  5.  Pt will verbalize plans for ongoing community fitness, including aquatic fitness options, upon d/c from PT. Baseline:  Goal status: IN PROGRESS   CLINICAL IMPRESSION: Skilled PT session today focused on functional strengthening and balance/gait mechanics.  Pt able to increase reps of sit<>stand (elevated mat and partial sit<>stand), working in smaller ranges, from 10 to 15.  Also, with standing exercises, pt is able to perform 4" and 6" steps in parallel bars simulating pool steps (she is working on this at aquatic therapy session), with pt requiring min assist for 4" step and mod assist for descending 6" step.  Pt continues to be motivated for therapy and is continuing to subjectively report improved strength (such as performing increased standing activities-standing to get mail, standing to get prescriptions at pharmacy).  Pt will continue to benefit from skilled PT to further address strength, balance, gait towards LTGs.  OBJECTIVE IMPAIRMENTS Abnormal gait, decreased activity tolerance, decreased balance, decreased endurance, decreased  mobility, difficulty walking, decreased strength, and postural dysfunction.   ACTIVITY LIMITATIONS community activity and community fitness .   PERSONAL FACTORS  PMH:  Cerebellar atrophy with ataxia, chronic low back pain s/p radiofrequency ablation 3/22; trigger point injections for L tennis elbow; myofascial pain and fibromyalgia.  lumbar spondylosis.  DM, neuropathy, closed foot fracture fall of 2022, HLD, PE, spinal stenosis, are also affecting patient's functional outcome.    REHAB POTENTIAL: Good  CLINICAL DECISION MAKING: Evolving/moderate complexity  EVALUATION COMPLEXITY:  Moderate  PLAN: PT FREQUENCY: 2x/week  PT DURATION: 8 weeks  (per recert 1/98/0221)  PLANNED INTERVENTIONS: Therapeutic exercises, Therapeutic activity, Neuromuscular re-education, Balance training, Gait training, Patient/Family education, Joint mobilization, DME instructions, Aquatic Therapy, and Manual therapy  PLAN FOR NEXT SESSION: .  Continue to work towards LTGs-short distance gait, progression of standing exercises, stair negotiation for when pt can use pool at her apartment complex. Continue to progress lower extremity strength and standing balance. Standing exercises at counter-head turns/nods, lateral or A/P weightshifting, reaching tasks.     Mady Haagensen, PT 10/08/21 8:40 AM Phone: [phone] Fax: [facility] at [facility] [address], [place], [place] [address] Phone # [phone] Fax # [phone]

## 2021-10-12 ENCOUNTER — Encounter: Payer: Self-pay | Admitting: Internal Medicine

## 2021-10-14 ENCOUNTER — Ambulatory Visit
Admission: RE | Admit: 2021-10-14 | Discharge: 2021-10-14 | Disposition: A | Discharge status: Home / Self Care | Payer: Medicare Other | Source: Ambulatory Visit | Attending: Family Medicine | Admitting: Family Medicine

## 2021-10-14 DIAGNOSIS — Z1231 Encounter for screening mammogram for malignant neoplasm of breast: Secondary | ICD-10-CM | POA: Diagnosis not present

## 2021-10-14 NOTE — Therapy (Signed)
OUTPATIENT PHYSICAL THERAPY NEURO TREATMENT   Patient Name: Joyce Brown MRN: [id number] DOB:1950-04-23, 71 y.o., female Today's Date: 10/15/2021  PCP: Talbert Cage, MD REFERRING PROVIDER: Marcial Pacas, MD      END OF SESSION:   PT End of Session - 10/15/21 0846     Visit Number 23    Number of Visits 30    Date for PT Re-Evaluation 10/28/21    Authorization Type Medicare/AARP    Progress Note Due on Visit 66    PT Start Time 0758    PT Stop Time 0845    PT Time Calculation (min) 47 min    Equipment Utilized During Treatment Gait belt   large bar bell, single bar bells, pool noodles   Activity Tolerance Patient tolerated treatment well    Behavior During Therapy WFL for tasks assessed/performed                     Past Medical History:  Diagnosis Date   Cerebellar ataxia ([place])    Diabetes mellitus without complication ([place])    Diabetic neuropathy ([place]) 12/09/2020   Foot fracture 2022   Gait abnormality 11/07/2018   Hyperlipidemia    Pulmonary embolism ([place])    Sleep apnea    Spinal stenosis    Past Surgical History:  Procedure Laterality Date   ABDOMINAL HYSTERECTOMY     ADENOIDECTOMY     ANKLE SURGERY Left    BILATERAL CARPAL TUNNEL RELEASE     BREAST BIOPSY Left 2019   benign   ELBOW SURGERY     GASTRIC BYPASS  2014   HAND NEUROPLASTY     radio denervation  05/21/2020   TONSILLECTOMY     Patient Active Problem List   Diagnosis Date Noted   H/O bariatric surgery 06/29/2021   Bilateral tennis elbow 06/15/2021   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   Type 1 diabetes mellitus with diabetic polyneuropathy ([place]) 12/25/2020   Diabetic neuropathy ([place]) 12/09/2020   Myofascial pain 09/12/2020   Wheelchair dependence 09/12/2020   Dyslipidemia 06/19/2020   Spondylosis without myelopathy or radiculopathy, lumbar region 04/27/2020   Sensorineural hearing loss (SNHL) of both ears 08/21/2019   Tinnitus, bilateral 06/27/2019   FH:  ovarian cancer in first degree relative 11/16/2018   Hypercholesteremia 11/15/2018   Gait abnormality 11/07/2018   Pain of left hip joint 05/18/2018   Chronic midline low back pain with bilateral sciatica 05/18/2018   Breast calcifications 12/28/2017   Fibromyalgia 12/06/2017   History of gastric bypass 12/06/2017   Spinal stenosis 12/06/2017   Asthma 12/06/2017   Cerebellar ataxia ([place])    Type I diabetes mellitus with complication, uncontrolled    Serum albumin decreased 11/10/2017   Vitamin B6 deficiency 11/10/2017   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   Hypoglycemia due to type 1 diabetes mellitus ([place]) 06/20/2016   Hyperparathyroidism ([place]) 10/29/2014   Increased PTH level 10/01/2014   Obstructive sleep apnea on CPAP 10/01/2014   Non-organic enuresis 05/28/2014   Urge incontinence of urine 05/28/2014   History of vaginal hysterectomy 05/24/2014   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   Pain of lumbar facet joint 04/11/2013   Mild persistent asthma without complication 17/51/0258   Carpal tunnel syndrome 52/77/8242   Lichen planus 35/36/1443   Adjustment disorder with mixed anxiety and depressed mood 05/11/2012   Degeneration of lumbar intervertebral disc 04/26/2012   Pain in joint involving ankle and foot 04/26/2012   Osteoarthritis of hand 05/24/2011   Leukopenia 09/25/2009  ONSET DATE: 07/06/2021 (MD referral); initial symptoms 2007  REFERRING DIAG: G11.9 (ICD-10-CM) - Cerebellar ataxia ([place]) R26.9 (ICD-10-CM) - Gait abnormality   THERAPY DIAG:  Unsteadiness on feet  Muscle weakness (generalized)  Other abnormalities of gait and mobility  PERTINENT HISTORY: Has Cerebellar atrophy with ataxia and also chronic low back pain s/p radiofrequency ablation 3/22; Has had trigger point injections for L tennis elbow. Has has Myofascial pain and fibromyalgia.  lumbar spondylosis, chronic LBP  PMH:  DM, neuropathy, closed foot fracture fall of 2022, HLD,  PE, spinal stenosis, tennis elbow                                                                                                                                                                                            PRECAUTIONS: Fall   -hx of Type 1 DM, carries glucose tablets with her; follows the "Rule of 15"  PATIENT GOALS: Pt's goals for therapy are to increase strength and mobility.     SUBJECTIVE: Was able to transfer into the pool she has at her apartment a couple times. Felt safe with it.    PAIN:  Are you having pain? No    TODAY'S TREATMENT:    TODAY'S TREATMENT: 10/15/21 Activity Comments  Nustep L5 x 6 min Ues/LEs   Trialed set up on recumbent bike for improved confidence at home   Completes 1 min with Les only  gait with RW around the gym ~122f Cueing to push walker further forward  Standing exercises in II bars: sidestepping Cues for taller chest  backwards walking  Cues for taller chest  R/L fwd/back stepping with B UE support  Cues to push hips anteriorly upon forward step and increase R knee flexion during swing through  Standing HS curl with 2# 10x each Cues to control eccentric phase   Sitting LAQ with 2# 10x each   Supine quad set 5x3" each Pillow under knee  SLR 5x each side Small quad lag evident        Access Code: [email or web address]: [email or web address] Date: 10/15/21 Access Code: [email or web address]: [email or web address] Date: 10/15/2021 [facility]: [facility]  Exercises - Seated Gluteal Sets  - 1 x daily - 7 x weekly - 2 sets - 10 reps - 3 sec hold - Seated Active Hip Flexion  - 1 x daily - 7 x weekly - 2 sets - 10 reps - 5 sec hold - Seated Shoulder Row with Anchored Resistance  - 1 x daily - 5 x weekly - 2 sets - 10 reps - Seated Shoulder Extension and Scapular Retraction with  Resistance  - 1 x daily - 5 x weekly - 2 sets - 10 reps - Modified Thomas Stretch  - 1 x daily - 5 x  weekly - 2 sets - 30 sec hold - Sit to Stand with Counter Support  - 1 x daily - 5 x weekly - 2 sets - 5 reps - Lower Trunk Rotations  - 1-2 x daily - 7 x weekly - 1 sets - 5-10 reps - Hooklying Isometric Clamshell  - 1 x daily - 5 x weekly - 1-2 sets - 10 reps - Seated Heel Raise  - 1 x daily - 5 x weekly - 1-2 sets - 10 reps - Standing Hip Abduction with Counter Support  - 1 x daily - 5 x weekly - 2 sets - 10 reps - Staggered Stance Forward Backward Weight Shift with Counter Support  - 1 x daily - 5 x weekly - 2 sets - 10 reps - Supine Quad Set  - 1 x daily - 5 x weekly - 2 sets - 10 reps - Supine Active Straight Leg Raise  - 1 x daily - 5 x weekly - 2 sets - 10 reps - Seated Long Arc Quad with Ankle Weight  - 1 x daily - 5 x weekly - 2 sets - 10 reps   PATIENT EDUCATION: Education details: HEP update; discussion on continued fitness after DC Person educated: Patient Education method: Explanation, Demonstration, Tactile cues, Verbal cues, and Handouts Education comprehension: verbalized understanding, returned demonstration, verbal cues required, and tactile cues required    GOALS:  FOR RECERT 9/44/9675: SHORT TERM GOALS: Target date: 09/30/2021  Pt will be independent with progression of HEP for improved strength, balance, gait and progression to use of apartment complex pool for exercise. Baseline: Goal status: GOAL MET, 10/01/2021  2.  Pt will improve 5x sit<>stand to less with no LOB and mod independently, to demonstrate improved functional strength and transfer efficiency. Baseline: decreased eccentric control; supervision Goal status: GOAL MET, 10/01/21  LONG TERM GOALS: Target date: 10/28/2021  Pt will be independent with final progression of HEP for improved strength, balance, transfers, and gait. Baseline:  Goal status: IN PROGRESS  2.  Pt will verbalize understanding of fall prevention in home environment.   Baseline:  Goal Status:  IN PROGRESS  3.  Pt will improve  TUG score to less than or equal to 35 sec for decreased fall risk. Baseline:  43.82 sec 09/02/2021; 10/01/21 55.97 sec with RW Goal status:  IN PROGRESS  4.  Berg Balance test to improve to at least 33/56 for decreased fall risk. Baseline: 19/56 07/22/21 at start of care; 28/56 on 08/19/21 Goal status: IN PROGRESS  5.  Pt will verbalize plans for ongoing community fitness, including aquatic fitness options, upon d/c from PT. Baseline:  Goal status: IN PROGRESS   CLINICAL IMPRESSION: Patient arrived to session with report of tolerance and good ability to transfer into pool at her apartment complex. Discussed end of POC with patient, with discussion on use of gym at her apartment. Trialed recumbent bike as patient reports that she has one at the apartment complex, which was tolerated well. Proceeded with standing balance activities focusing on promoting upright posture as patient still has tendency to demonstrate anterior trunk lean, particularly with posterior stepping. Frequent short sitting breaks taken in between d/t UE fatigue. Took time to review and practice multiple exercises to address quad strength as patient reports some remaining difficulty ascending tall pool stairs. Patient  reported understanding and without complaints at end of session.   OBJECTIVE IMPAIRMENTS Abnormal gait, decreased activity tolerance, decreased balance, decreased endurance, decreased mobility, difficulty walking, decreased strength, and postural dysfunction.   ACTIVITY LIMITATIONS community activity and community fitness .   PERSONAL FACTORS  PMH:  Cerebellar atrophy with ataxia, chronic low back pain s/p radiofrequency ablation 3/22; trigger point injections for L tennis elbow; myofascial pain and fibromyalgia.  lumbar spondylosis.  DM, neuropathy, closed foot fracture fall of 2022, HLD, PE, spinal stenosis, are also affecting patient's functional outcome.    REHAB POTENTIAL: Good  CLINICAL DECISION MAKING:  Evolving/moderate complexity  EVALUATION COMPLEXITY: Moderate  PLAN: PT FREQUENCY: 2x/week  PT DURATION: 8 weeks  (per recert 7/56/1254)  PLANNED INTERVENTIONS: Therapeutic exercises, Therapeutic activity, Neuromuscular re-education, Balance training, Gait training, Patient/Family education, Joint mobilization, DME instructions, Aquatic Therapy, and Manual therapy  PLAN FOR NEXT SESSION: .  Continue to work towards LTGs-short distance gait, progression of standing exercises, stair negotiation for when pt can use pool at her apartment complex. Continue to progress lower extremity strength and standing balance. Standing exercises at counter-head turns/nods, lateral or A/P weightshifting, reaching tasks.      Janene Harvey, PT, DPT 10/15/21 8:48 AM  [facility] at [facility] [address] [place], [place], [place] [address] Phone # [phone] Fax # [phone]

## 2021-10-15 ENCOUNTER — Ambulatory Visit: Payer: Medicare Other | Attending: Neurology | Admitting: Physical Therapy

## 2021-10-15 ENCOUNTER — Encounter: Payer: Self-pay | Admitting: Physical Therapy

## 2021-10-15 DIAGNOSIS — R41840 Attention and concentration deficit: Secondary | ICD-10-CM | POA: Insufficient documentation

## 2021-10-15 DIAGNOSIS — M7712 Lateral epicondylitis, left elbow: Secondary | ICD-10-CM | POA: Insufficient documentation

## 2021-10-15 DIAGNOSIS — R278 Other lack of coordination: Secondary | ICD-10-CM | POA: Diagnosis not present

## 2021-10-15 DIAGNOSIS — R29818 Other symptoms and signs involving the nervous system: Secondary | ICD-10-CM | POA: Diagnosis not present

## 2021-10-15 DIAGNOSIS — M7711 Lateral epicondylitis, right elbow: Secondary | ICD-10-CM | POA: Insufficient documentation

## 2021-10-15 DIAGNOSIS — R2689 Other abnormalities of gait and mobility: Secondary | ICD-10-CM | POA: Diagnosis not present

## 2021-10-15 DIAGNOSIS — R270 Ataxia, unspecified: Secondary | ICD-10-CM | POA: Insufficient documentation

## 2021-10-15 DIAGNOSIS — M6281 Muscle weakness (generalized): Secondary | ICD-10-CM | POA: Insufficient documentation

## 2021-10-15 DIAGNOSIS — R2681 Unsteadiness on feet: Secondary | ICD-10-CM | POA: Diagnosis not present

## 2021-10-20 ENCOUNTER — Ambulatory Visit: Payer: Medicare Other | Admitting: Physical Therapy

## 2021-10-20 DIAGNOSIS — R2689 Other abnormalities of gait and mobility: Secondary | ICD-10-CM

## 2021-10-20 DIAGNOSIS — R2681 Unsteadiness on feet: Secondary | ICD-10-CM

## 2021-10-20 DIAGNOSIS — R270 Ataxia, unspecified: Secondary | ICD-10-CM | POA: Diagnosis not present

## 2021-10-20 DIAGNOSIS — M6281 Muscle weakness (generalized): Secondary | ICD-10-CM | POA: Diagnosis not present

## 2021-10-20 DIAGNOSIS — R29818 Other symptoms and signs involving the nervous system: Secondary | ICD-10-CM | POA: Diagnosis not present

## 2021-10-20 DIAGNOSIS — M7711 Lateral epicondylitis, right elbow: Secondary | ICD-10-CM | POA: Diagnosis not present

## 2021-10-20 NOTE — Therapy (Signed)
OUTPATIENT PHYSICAL THERAPY NEURO TREATMENT   Patient Name: Joyce Brown MRN: [id number] DOB:07/15/1950, 71 y.o., female Today's Date: 10/21/2021  PCP: Talbert Cage, MD REFERRING PROVIDER: Marcial Pacas, MD      END OF SESSION:   PT End of Session - 10/21/21 0931     Visit Number 24    Number of Visits 30    Date for PT Re-Evaluation 10/28/21    Authorization Type Medicare/[employer]    Progress Note Due on Visit 41    PT Start Time 0846    PT Stop Time 0927    PT Time Calculation (min) 41 min    Equipment Utilized During Treatment Gait belt    Activity Tolerance Patient tolerated treatment well    Behavior During Therapy WFL for tasks assessed/performed                      Past Medical History:  Diagnosis Date   Cerebellar ataxia ([place])    Diabetes mellitus without complication ([place])    Diabetic neuropathy ([place]) 12/09/2020   Foot fracture 2022   Gait abnormality 11/07/2018   Hyperlipidemia    Pulmonary embolism ([place])    Sleep apnea    Spinal stenosis    Past Surgical History:  Procedure Laterality Date   ABDOMINAL HYSTERECTOMY     ADENOIDECTOMY     ANKLE SURGERY Left    BILATERAL CARPAL TUNNEL RELEASE     BREAST BIOPSY Left 2019   benign   ELBOW SURGERY     GASTRIC BYPASS  2014   HAND NEUROPLASTY     radio denervation  05/21/2020   TONSILLECTOMY     Patient Active Problem List   Diagnosis Date Noted   H/O bariatric surgery 06/29/2021   Bilateral tennis elbow 06/15/2021   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   Type 1 diabetes mellitus with diabetic polyneuropathy ([place]) 12/25/2020   Diabetic neuropathy ([place]) 12/09/2020   Myofascial pain 09/12/2020   Wheelchair dependence 09/12/2020   Dyslipidemia 06/19/2020   Spondylosis without myelopathy or radiculopathy, lumbar region 04/27/2020   Sensorineural hearing loss (SNHL) of both ears 08/21/2019   Tinnitus, bilateral 06/27/2019   FH: ovarian cancer in first degree relative 11/16/2018    Hypercholesteremia 11/15/2018   Gait abnormality 11/07/2018   Pain of left hip joint 05/18/2018   Chronic midline low back pain with bilateral sciatica 05/18/2018   Breast calcifications 12/28/2017   Fibromyalgia 12/06/2017   History of gastric bypass 12/06/2017   Spinal stenosis 12/06/2017   Asthma 12/06/2017   Cerebellar ataxia ([place])    Type I diabetes mellitus with complication, uncontrolled    Serum albumin decreased 11/10/2017   Vitamin B6 deficiency 11/10/2017   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   Hypoglycemia due to type 1 diabetes mellitus ([place]) 06/20/2016   Hyperparathyroidism ([place]) 10/29/2014   Increased PTH level 10/01/2014   Obstructive sleep apnea on CPAP 10/01/2014   Non-organic enuresis 05/28/2014   Urge incontinence of urine 05/28/2014   History of vaginal hysterectomy 05/24/2014   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   Pain of lumbar facet joint 04/11/2013   Mild persistent asthma without complication 66/08/3014   Carpal tunnel syndrome 03/23/3233   Lichen planus 57/32/2025   Adjustment disorder with mixed anxiety and depressed mood 05/11/2012   Degeneration of lumbar intervertebral disc 04/26/2012   Pain in joint involving ankle and foot 04/26/2012   Osteoarthritis of hand 05/24/2011   Leukopenia 09/25/2009    ONSET DATE: 07/06/2021 (MD referral);  initial symptoms 2007  REFERRING DIAG: G11.9 (ICD-10-CM) - Cerebellar ataxia ([place]) R26.9 (ICD-10-CM) - Gait abnormality   THERAPY DIAG:  Unsteadiness on feet  Muscle weakness (generalized)  Other abnormalities of gait and mobility  Other symptoms and signs involving the nervous system  PERTINENT HISTORY: Has Cerebellar atrophy with ataxia and also chronic low back pain s/p radiofrequency ablation 3/22; Has had trigger point injections for L tennis elbow. Has has Myofascial pain and fibromyalgia.  lumbar spondylosis, chronic LBP  PMH:  DM, neuropathy, closed foot fracture fall of 2022,  HLD, PE, spinal stenosis, tennis elbow                                                                                                                                                                                            PRECAUTIONS: Fall   -hx of Type 1 DM, carries glucose tablets with her; follows the "Rule of 15"  PATIENT GOALS: Pt's goals for therapy are to increase strength and mobility.     SUBJECTIVE: Having a good morning. Has not been able to check out the fitness center d/t the door not working.    PAIN:  Are you having pain? No    TODAY'S TREATMENT:    TODAY'S TREATMENT: 10/21/21 Activity Comments  Nustep L5 x 6 min Ues/Les  Maintaining ~60SPM  Gait with RW in the gym 20f   Lateral weight shift in II bars Cueing to coordinate movement; heavy anterior trunk lean  1 foot on 4" step balance 2x20 sec  Using ~2 fingertip support  fwd/back stepping with B II bars 2x8x each Heavy UE support; cues to shift weight anteriorly upon forward step   Seated red TB row 2x10 Cues to depress and retract shoulders, avoid extending past neutral  Seated red TB horizontal abduction 2x10 Good posture after cues      Access Code: [email or web address]: [email or web address] Date: 10/15/21 Access Code: [email or web address]: [email or web address] Date: 10/15/2021 [facility]ared by: [facility]  Exercises - Seated Gluteal Sets  - 1 x daily - 7 x weekly - 2 sets - 10 reps - 3 sec hold - Seated Active Hip Flexion  - 1 x daily - 7 x weekly - 2 sets - 10 reps - 5 sec hold - Seated Shoulder Row with Anchored Resistance  - 1 x daily - 5 x weekly - 2 sets - 10 reps - Seated Shoulder Extension and Scapular Retraction with Resistance  - 1 x daily - 5 x weekly - 2 sets - 10 reps - Modified Thomas Stretch  - 1 x daily - 5 x  weekly - 2 sets - 30 sec hold - Sit to Stand with Counter Support  - 1 x daily - 5 x weekly - 2 sets - 5 reps - Lower Trunk  Rotations  - 1-2 x daily - 7 x weekly - 1 sets - 5-10 reps - Hooklying Isometric Clamshell  - 1 x daily - 5 x weekly - 1-2 sets - 10 reps - Seated Heel Raise  - 1 x daily - 5 x weekly - 1-2 sets - 10 reps - Standing Hip Abduction with Counter Support  - 1 x daily - 5 x weekly - 2 sets - 10 reps - Staggered Stance Forward Backward Weight Shift with Counter Support  - 1 x daily - 5 x weekly - 2 sets - 10 reps - Supine Quad Set  - 1 x daily - 5 x weekly - 2 sets - 10 reps - Supine Active Straight Leg Raise  - 1 x daily - 5 x weekly - 2 sets - 10 reps - Seated Long Arc Quad with Ankle Weight  - 1 x daily - 5 x weekly - 2 sets - 10 reps   PATIENT EDUCATION: Education details: HEP update; discussion on continued fitness after DC Person educated: Patient Education method: Explanation, Demonstration, Tactile cues, Verbal cues, and Handouts Education comprehension: verbalized understanding, returned demonstration, verbal cues required, and tactile cues required    GOALS:  FOR RECERT 2/70/6237: SHORT TERM GOALS: Target date: 09/30/2021  Pt will be independent with progression of HEP for improved strength, balance, gait and progression to use of apartment complex pool for exercise. Baseline: Goal status: GOAL MET, 10/01/2021  2.  Pt will improve 5x sit<>stand to less with no LOB and mod independently, to demonstrate improved functional strength and transfer efficiency. Baseline: decreased eccentric control; supervision Goal status: GOAL MET, 10/01/21  LONG TERM GOALS: Target date: 10/28/2021  Pt will be independent with final progression of HEP for improved strength, balance, transfers, and gait. Baseline:  Goal status: IN PROGRESS  2.  Pt will verbalize understanding of fall prevention in home environment.   Baseline:  Goal Status:  IN PROGRESS  3.  Pt will improve TUG score to less than or equal to 35 sec for decreased fall risk. Baseline:  43.82 sec 09/02/2021; 10/01/21 55.97 sec with  RW Goal status:  IN PROGRESS  4.  Berg Balance test to improve to at least 33/56 for decreased fall risk. Baseline: 19/56 07/22/21 at start of care; 28/56 on 08/19/21 Goal status: IN PROGRESS  5.  Pt will verbalize plans for ongoing community fitness, including aquatic fitness options, upon d/c from PT. Baseline:  Goal status: IN PROGRESS   CLINICAL IMPRESSION: Patient arrived to session without new complaints. Patient performed static and dynamic balance activities in parallel bars for safety. Patient with improved awareness of forward flexed posture but fatigues with short duration of standing activities, requiring short sitting rest breaks. Patient with concerns about her UE strength d/t fatigue, thus explained importance of continued postural strengthening to avoid excessive support on arms with balance activities. No complaints at end of session.   OBJECTIVE IMPAIRMENTS Abnormal gait, decreased activity tolerance, decreased balance, decreased endurance, decreased mobility, difficulty walking, decreased strength, and postural dysfunction.   ACTIVITY LIMITATIONS community activity and community fitness .   PERSONAL FACTORS  PMH:  Cerebellar atrophy with ataxia, chronic low back pain s/p radiofrequency ablation 3/22; trigger point injections for L tennis elbow; myofascial pain and fibromyalgia.  lumbar spondylosis.  DM, neuropathy, closed foot fracture fall of 2022, HLD, PE, spinal stenosis, are also affecting patient's functional outcome.    REHAB POTENTIAL: Good  CLINICAL DECISION MAKING: Evolving/moderate complexity  EVALUATION COMPLEXITY: Moderate  PLAN: PT FREQUENCY: 2x/week  PT DURATION: 8 weeks  (per recert 08/25/2398)  PLANNED INTERVENTIONS: Therapeutic exercises, Therapeutic activity, Neuromuscular re-education, Balance training, Gait training, Patient/Family education, Joint mobilization, DME instructions, Aquatic Therapy, and Manual therapy  PLAN FOR NEXT SESSION: .   Continue to work towards LTGs-short distance gait, progression of standing exercises, stair negotiation for when pt can use pool at her apartment complex. Continue to progress lower extremity strength and standing balance. Standing exercises at counter-head turns/nods, lateral or A/P weightshifting, reaching tasks.      Janene Harvey, PT, DPT 10/21/21 9:32 AM  [facility] at [facility] [address] [place], [place], [place] [address] Phone # [phone] Fax # [phone]

## 2021-10-21 ENCOUNTER — Encounter: Payer: Self-pay | Admitting: Occupational Therapy

## 2021-10-21 ENCOUNTER — Encounter: Payer: Self-pay | Admitting: Physical Therapy

## 2021-10-21 ENCOUNTER — Ambulatory Visit: Payer: Medicare Other | Admitting: Occupational Therapy

## 2021-10-21 ENCOUNTER — Ambulatory Visit: Payer: Medicare Other | Admitting: Physical Therapy

## 2021-10-21 DIAGNOSIS — R270 Ataxia, unspecified: Secondary | ICD-10-CM

## 2021-10-21 DIAGNOSIS — R278 Other lack of coordination: Secondary | ICD-10-CM

## 2021-10-21 DIAGNOSIS — R2681 Unsteadiness on feet: Secondary | ICD-10-CM

## 2021-10-21 DIAGNOSIS — M6281 Muscle weakness (generalized): Secondary | ICD-10-CM

## 2021-10-21 DIAGNOSIS — M7711 Lateral epicondylitis, right elbow: Secondary | ICD-10-CM | POA: Diagnosis not present

## 2021-10-21 DIAGNOSIS — R29818 Other symptoms and signs involving the nervous system: Secondary | ICD-10-CM

## 2021-10-21 DIAGNOSIS — R2689 Other abnormalities of gait and mobility: Secondary | ICD-10-CM

## 2021-10-21 DIAGNOSIS — M7712 Lateral epicondylitis, left elbow: Secondary | ICD-10-CM

## 2021-10-21 DIAGNOSIS — R41840 Attention and concentration deficit: Secondary | ICD-10-CM

## 2021-10-21 NOTE — Therapy (Signed)
OUTPATIENT OCCUPATIONAL THERAPY TREATMENT NOTE & DISCHARGE SUMMARY   Patient Name: Joyce Brown MRN: [id number] DOB:28-Sep-1950, 71 y.o., female Today's Date: 10/21/2021  PCP: Talbert Cage, MD REFERRING PROVIDER: Marcial Pacas, MD  END OF SESSION:   OT End of Session - 10/21/21 0807     Visit Number 12    Number of Visits 17    Date for OT Re-Evaluation 11/20/21    Authorization Type Medicare    OT Start Time 0802    OT Stop Time 0842    OT Time Calculation (min) 40 min    Activity Tolerance Patient tolerated treatment well    Behavior During Therapy WFL for tasks assessed/performed            OCCUPATIONAL THERAPY DISCHARGE SUMMARY  Visits from Start of Care: 12  Current functional level related to goals / functional outcomes: Pt reports she is able to complete functional activities w/ at least Mod Ind; see goals below   Remaining deficits: Ataxia, generalized muscle weakness, fall risk   Education / Equipment: Condition-specific education, pain and edema management, HEP, custom orthosis wear and care   Patient agrees to discharge. Patient goals were partially met. Patient is being discharged due to being pleased with the current functional level.   Past Medical History:  Diagnosis Date   Cerebellar ataxia ([place])    Diabetes mellitus without complication ([place])    Diabetic neuropathy ([place]) 12/09/2020   Foot fracture 2022   Gait abnormality 11/07/2018   Hyperlipidemia    Pulmonary embolism ([place])    Sleep apnea    Spinal stenosis    Past Surgical History:  Procedure Laterality Date   ABDOMINAL HYSTERECTOMY     ADENOIDECTOMY     ANKLE SURGERY Left    BILATERAL CARPAL TUNNEL RELEASE     BREAST BIOPSY Left 2019   benign   ELBOW SURGERY     GASTRIC BYPASS  2014   HAND NEUROPLASTY     radio denervation  05/21/2020   TONSILLECTOMY     Patient Active Problem List   Diagnosis Date Noted   H/O bariatric surgery 06/29/2021   Bilateral tennis elbow  06/15/2021   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   Type 1 diabetes mellitus with diabetic polyneuropathy ([place]) 12/25/2020   Diabetic neuropathy ([place]) 12/09/2020   Myofascial pain 09/12/2020   Wheelchair dependence 09/12/2020   Dyslipidemia 06/19/2020   Spondylosis without myelopathy or radiculopathy, lumbar region 04/27/2020   Sensorineural hearing loss (SNHL) of both ears 08/21/2019   Tinnitus, bilateral 06/27/2019   FH: ovarian cancer in first degree relative 11/16/2018   Hypercholesteremia 11/15/2018   Gait abnormality 11/07/2018   Pain of left hip joint 05/18/2018   Chronic midline low back pain with bilateral sciatica 05/18/2018   Breast calcifications 12/28/2017   Fibromyalgia 12/06/2017   History of gastric bypass 12/06/2017   Spinal stenosis 12/06/2017   Asthma 12/06/2017   Cerebellar ataxia ([place])    Type I diabetes mellitus with complication, uncontrolled    Serum albumin decreased 11/10/2017   Vitamin B6 deficiency 11/10/2017   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   Hypoglycemia due to type 1 diabetes mellitus ([place]) 06/20/2016   Hyperparathyroidism ([place]) 10/29/2014   Increased PTH level 10/01/2014   Obstructive sleep apnea on CPAP 10/01/2014   Non-organic enuresis 05/28/2014   Urge incontinence of urine 05/28/2014   History of vaginal hysterectomy 05/24/2014   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   Pain of lumbar facet joint 04/11/2013   Mild persistent  asthma without complication 00/93/8182   Carpal tunnel syndrome 99/37/1696   Lichen planus 78/93/8101   Adjustment disorder with mixed anxiety and depressed mood 05/11/2012   Degeneration of lumbar intervertebral disc 04/26/2012   Pain in joint involving ankle and foot 04/26/2012   Osteoarthritis of hand 05/24/2011   Leukopenia 09/25/2009    ONSET DATE: 07/22/21 (referral date)  REFERRING DIAG:  R26.89 (ICD-10-CM) - Other abnormalities of gait and mobility R29.818 (ICD-10-CM) - Other  symptoms and signs involving the nervous system   THERAPY DIAG:  Ataxia  Lateral epicondylitis of right elbow  Lateral epicondylitis, left elbow  Other lack of coordination  Attention and concentration deficit  SUBJECTIVE:   SUBJECTIVE STATEMENT: Pt reports she is feeling good and comfortable with plan to discharge at this time  PAIN: Are you having pain? 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  PATIENT GOALS: Stretches for tennis elbow; increase dexterity   OBJECTIVE:  TODAY'S TREATMENT - 10/21/21:  Wrist Exercises Wrist extension w/ 1 lb dumbbell completed w/ forearm resting in pronation on tabletop; occ verbal cues to prevent elbow flexion and isolate the wrist  Thumb Exercises Lateral/key pinch against yellow (soft) resistance putty completed 10x w/ each hand; OT provided mod verbal and tactile cues to maintain appropriate thumb alignment throughout, particularly on R side  Composite thumb flexion against yellow resistance putty completed 10x w/ R hand; pt demonstrating difficulty isolating thumb flexion w/out compensatory finger flexion  Thumb extension against yellow resistance putty completed 10x w/ each hand and OT providing modeling and mod cues for initial understanding and to maintain good thumb alignment throughout w/ good carryover within exercise demonstrated    PATIENT EDUCATION: Ongoing condition-specific education in anticipation for discharge, particularly related to continued pain and symptom management and  progression of light strengthening exercises for forearm, wrist, and hand w/ OT providing corresponding handouts and further instruction Person educated: Patient Education method: Explanation and Demonstration Education comprehension: verbalized understanding   HOME EXERCISE PROGRAM: MedBridge Code: [facility] Coordination activities (printed handout administered) Active and passive stretches per [place] [facility] protocol for lateral epicondylitis (printed handout administered) MedBridge Code: [id number] - Key Pinch with Putty  - 1-2 sets - 10 reps - Thumb Radial Abduction with Putty Loop  - 1-2 sets - 10 reps - Thumb MCP and IP Flexion with Putty  - 1-2 sets - 10 reps   GOALS: Goals reviewed with patient? Yes  SHORT TERM GOALS: Target date: 08/28/2021  STG  Status:  1 Pt will verbalize understanding of at least 2 precautions and positional considerations related to worsening of lateral epicondylitis symptoms Baseline: Decreased knowledge of precautions Achieved - 08/05/21  2 Pt will demonstrate understanding of initial HEP designed for increased BUE GMC, ROM, and stretching Baseline: No HEP at this time Achieved - 09/09/21  3 Pt will demonstrate understanding of at least 1 visual compensatory strategy to reduce functional impact of visual perceptual limitations Baseline: Reports diplopia; no currently compensatory strategies Achieved - 08/26/21    LONG TERM GOALS: Target date: 09/25/2021  LTG  Status:  1 Pt will demonstrate understanding of  initial HEP designed for increased BUE GMC, ROM, and stretching Baseline: No HEP at this time Met - pt reports completing exercises at home per protocol, completing heat and massage 1-2x/day  2 Pt will improve L hand grip strength by at least 9 lbs w/ pain less than 2/10 by discharge Baseline: 35 lbs (RUE 58 lbs) Met - 09/25/21: 49 lbs  3 Pt will increase Box and Blocks score bilaterally by at least 3 blocks to demonstrate improved [place] Baseline: 34 w/  RUE; 26 w/ LUE Not Met - 10/21/21: RUE: 36, LUE: 29  4 Pt will be able to grip and lift a lightweight object (e.g., coffee mug) w/out pain in L elbow Baseline: Pain w/ grasp and lifting Met - 10/21/21: per pt report  5 Pt will decrease time to complete 9-HPT by at least 4 seconds to indicate improved control and coordination for functional bilateral FM tasks Baseline: 54.9 sec (RUE 38.5 sec) Met - 09/16/21: 45.5 sec w/ RUE     ASSESSMENT:  CLINICAL IMPRESSION: Kryssa Risenhoover is a 71 y/o who has been seen in OP OT for decreased functional use of BUEs 2/2 cerebellar atrophy w/ ataxia and bilateral epicondylitis. Pt returns today after going on-hold about 4 weeks ago to allow time for implementation of strategies at home and continued tissue healing before reassessing progress toward goals. At this time, pt has shown improvements in all areas addressed in therapy w/ 3/3 STGs and 4/5 LTGs met. Pt is appropriate for d/c from skilled OT to HEP at this time, reports she is satisfied with progress, and is currently agreeable to discharge plan.  PERFORMANCE DEFICITS in functional skills including ADLs, IADLs, coordination, dexterity, sensation, tone, ROM, strength, pain, FMC, GMC, mobility, balance, body mechanics, decreased knowledge of precautions, decreased knowledge of use of DME, vision, and UE functional use.   IMPAIRMENTS are limiting patient from ADLs, IADLs, rest and sleep, leisure, and social participation.   COMORBIDITIES has co-morbidities such as DM w/ neuropathy and retinopathy, chronic low back pain, asthma, and myofascial pain  that affects occupational performance. Patient will benefit from skilled OT to address above impairments and improve overall function.   PLAN: OT FREQUENCY: 2x/week  OT DURATION: 8 weeks  PLANNED INTERVENTIONS: self care/ADL training, therapeutic exercise, therapeutic activity, neuromuscular re-education, manual therapy, passive range of motion, functional mobility  training, aquatic therapy, splinting, ultrasound, iontophoresis, compression bandaging, moist heat, cryotherapy, patient/family education, visual/perceptual remediation/compensation, and DME and/or AE instructions  RECOMMENDED OTHER SERVICES: Currently receiving PT services at this location  CONSULTED AND AGREED WITH PLAN OF CARE: Patient  PLAN FOR NEXT SESSION: D/C    Kathrine Cords, OTR/L 10/21/2021, 8:50 AM

## 2021-10-21 NOTE — Therapy (Signed)
OUTPATIENT PHYSICAL THERAPY NEURO TREATMENT   Patient Name: Joyce Brown MRN: [id number] DOB:[birth year], 71 y.o., female Today's Date: 10/21/2021  PCP: Talbert Cage, MD REFERRING PROVIDER: Marcial Pacas, MD      END OF SESSION:   PT End of Session - 10/20/21 1329     Visit Number 24    Number of Visits 30    Date for PT Re-Evaluation 10/28/21    Authorization Type Medicare/[employer]    Progress Note Due on Visit 35    PT Start Time 0845    PT Stop Time 0928    PT Time Calculation (min) 43 min    Equipment Utilized During Treatment Other (comment)   single bar bells, pool noodles   Activity Tolerance Patient tolerated treatment well    Behavior During Therapy WFL for tasks assessed/performed                     Past Medical History:  Diagnosis Date   Cerebellar ataxia ([place])    Diabetes mellitus without complication ([place])    Diabetic neuropathy ([place]) 12/09/2020   Foot fracture 2022   Gait abnormality 11/07/2018   Hyperlipidemia    Pulmonary embolism ([place])    Sleep apnea    Spinal stenosis    Past Surgical History:  Procedure Laterality Date   ABDOMINAL HYSTERECTOMY     ADENOIDECTOMY     ANKLE SURGERY Left    BILATERAL CARPAL TUNNEL RELEASE     BREAST BIOPSY Left 2019   benign   ELBOW SURGERY     GASTRIC BYPASS  2014   HAND NEUROPLASTY     radio denervation  05/21/2020   TONSILLECTOMY     Patient Active Problem List   Diagnosis Date Noted   H/O bariatric surgery 06/29/2021   Bilateral tennis elbow 06/15/2021   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   Type 1 diabetes mellitus with diabetic polyneuropathy ([place]) 12/25/2020   Diabetic neuropathy ([place]) 12/09/2020   Myofascial pain 09/12/2020   Wheelchair dependence 09/12/2020   Dyslipidemia 06/19/2020   Spondylosis without myelopathy or radiculopathy, lumbar region 04/27/2020   Sensorineural hearing loss (SNHL) of both ears 08/21/2019   Tinnitus, bilateral 06/27/2019   FH: ovarian cancer  in first degree relative 11/16/2018   Hypercholesteremia 11/15/2018   Gait abnormality 11/07/2018   Pain of left hip joint 05/18/2018   Chronic midline low back pain with bilateral sciatica 05/18/2018   Breast calcifications 12/28/2017   Fibromyalgia 12/06/2017   History of gastric bypass 12/06/2017   Spinal stenosis 12/06/2017   Asthma 12/06/2017   Cerebellar ataxia ([place])    Type I diabetes mellitus with complication, uncontrolled    Serum albumin decreased 11/10/2017   Vitamin B6 deficiency 11/10/2017   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   Hypoglycemia due to type 1 diabetes mellitus ([place]) 06/20/2016   Hyperparathyroidism ([place]) 10/29/2014   Increased PTH level 10/01/2014   Obstructive sleep apnea on CPAP 10/01/2014   Non-organic enuresis 05/28/2014   Urge incontinence of urine 05/28/2014   History of vaginal hysterectomy 05/24/2014   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   Pain of lumbar facet joint 04/11/2013   Mild persistent asthma without complication 48/27/0786   Carpal tunnel syndrome 75/44/9201   Lichen planus 00/71/2197   Adjustment disorder with mixed anxiety and depressed mood 05/11/2012   Degeneration of lumbar intervertebral disc 04/26/2012   Pain in joint involving ankle and foot 04/26/2012   Osteoarthritis of hand 05/24/2011   Leukopenia 09/25/2009  ONSET DATE: 07/06/2021 (MD referral); initial symptoms 2007  REFERRING DIAG: G11.9 (ICD-10-CM) - Cerebellar ataxia ([place]) R26.9 (ICD-10-CM) - Gait abnormality   THERAPY DIAG:  Unsteadiness on feet  Muscle weakness (generalized)  Other abnormalities of gait and mobility  PERTINENT HISTORY: Has Cerebellar atrophy with ataxia and also chronic low back pain s/p radiofrequency ablation 3/22; Has had trigger point injections for L tennis elbow. Has has Myofascial pain and fibromyalgia.  lumbar spondylosis, chronic LBP  PMH:  DM, neuropathy, closed foot fracture fall of 2022, HLD, PE, spinal  stenosis, tennis elbow                                                                                                                                                                                            PRECAUTIONS: Fall   -hx of Type 1 DM, carries glucose tablets with her; follows the "Rule of 15"  PATIENT GOALS: Pt's goals for therapy are to increase strength and mobility.     SUBJECTIVE: Has been in her apartment pool 3 times since last aquatic session. Reports use of pool noodles and waist belt. "I flailed around a little, however did not go under water". No falls or pain to reported.      PAIN:  Are you having pain? No    TODAY'S TREATMENT:  Aquatic therapy at [facility] - pool temp 90 degrees   Patient seen for aquatic therapy today.  Treatment took place in water 3.5-4.5 feet deep depending upon activity.  Pt entered/exited pool via single middle rail with step to pattern and CGA for safety to continue to simulate home pool set up.    Use of 2 yellow noodles after 2 laps forward with SBA only needed for safety.  Cues with all gait for hip extension/knee extension to improve posture Forward x 8 laps total with cues for posture/hip extension, step placement and weight shifting Backward x 8 laps with cues for posture and step length with noodles Side stepping left<>right x 5 laps toward each side with cues for posture and step length with noodles   At wall in ~4.0 foot water depth with UE support on pool wall: facing the wall pt performed the following ex's with cues on form/technique, min guard assist for safety. Heel toe raises x 20 reps Alternating slow high knee marching  15 reps each side Alternating HS curls x 15 reps each side Alternating hip abd/add x 15 reps each side Alternating hip extension x 15 reps each side   With back to wall in ~4.0 foot water depth with single bar bells: Alternating punching for 10 reps Shoulder horizontal abd/add  x 10 reps Holding  them out at sides: alternating bringing one down under water to legs<>back to water surface for 10 reps each side Holding them out in front: alternating bringing one down under water to legs<>back to water surface for 10 reps each side.        Use of yellow noodles for gait from deeper water to stairs to exit pool with min/mod assist.       Pt requires buoyancy of water for support for reduced fall risk with gait training and balance exercises with minimal UE support; exercises able to be performed safely in water without the risk of fall compared to those same exercises performed on land;  viscosity of water needed for resistance for strengthening.  Current of water provides perturbations for challenging static & dynamic standing balance.       HOME EXERCISE PROGRAM:  Access Code: [email or web address] URL: [email or web address] Date: 10/15/21 Access Code: [email or web address] URL: [email or web address] Date: 10/15/2021 Prepared by: [facility]  Exercises - Seated Gluteal Sets  - 1 x daily - 7 x weekly - 2 sets - 10 reps - 3 sec hold - Seated Active Hip Flexion  - 1 x daily - 7 x weekly - 2 sets - 10 reps - 5 sec hold - Seated Shoulder Row with Anchored Resistance  - 1 x daily - 5 x weekly - 2 sets - 10 reps - Seated Shoulder Extension and Scapular Retraction with Resistance  - 1 x daily - 5 x weekly - 2 sets - 10 reps - Modified Thomas Stretch  - 1 x daily - 5 x weekly - 2 sets - 30 sec hold - Sit to Stand with Counter Support  - 1 x daily - 5 x weekly - 2 sets - 5 reps - Lower Trunk Rotations  - 1-2 x daily - 7 x weekly - 1 sets - 5-10 reps - Hooklying Isometric Clamshell  - 1 x daily - 5 x weekly - 1-2 sets - 10 reps - Seated Heel Raise  - 1 x daily - 5 x weekly - 1-2 sets - 10 reps - Standing Hip Abduction with Counter Support  - 1 x daily - 5 x weekly - 2 sets - 10 reps - Staggered Stance Forward Backward Weight Shift with Counter  Support  - 1 x daily - 5 x weekly - 2 sets - 10 reps - Supine Quad Set  - 1 x daily - 5 x weekly - 2 sets - 10 reps - Supine Active Straight Leg Raise  - 1 x daily - 5 x weekly - 2 sets - 10 reps - Seated Long Arc Quad with Ankle Weight  - 1 x daily - 5 x weekly - 2 sets - 10 reps   PATIENT EDUCATION: Education details: continue with current HEP Person educated: Patient Education method: Explanation, Demonstration, Tactile cues, Verbal cues, and Handouts Education comprehension: verbalized understanding, returned demonstration, verbal cues required, and tactile cues required    GOALS:  FOR RECERT 6/60/6301: SHORT TERM GOALS: Target date: 09/30/2021  Pt will be independent with progression of HEP for improved strength, balance, gait and progression to use of apartment complex pool for exercise. Baseline: Goal status: GOAL MET, 10/01/2021  2.  Pt will improve 5x sit<>stand to less with no LOB and mod independently, to demonstrate improved functional strength and transfer efficiency. Baseline: decreased eccentric control; supervision Goal status: GOAL MET, 10/01/21  LONG TERM GOALS: Target date:  10/28/2021  Pt will be independent with final progression of HEP for improved strength, balance, transfers, and gait. Baseline:  Goal status: IN PROGRESS  2.  Pt will verbalize understanding of fall prevention in home environment.   Baseline:  Goal Status:  IN PROGRESS  3.  Pt will improve TUG score to less than or equal to 35 sec for decreased fall risk. Baseline:  43.82 sec 09/02/2021; 10/01/21 55.97 sec with RW Goal status:  IN PROGRESS  4.  Berg Balance test to improve to at least 33/56 for decreased fall risk. Baseline: 19/56 07/22/21 at start of care; 28/56 on 08/19/21 Goal status: IN PROGRESS  5.  Pt will verbalize plans for ongoing community fitness, including aquatic fitness options, upon d/c from PT. Baseline:  Goal status: IN PROGRESS   CLINICAL IMPRESSION:  Today's skilled  session continued to focus on gait, strengthening and balance in the aquatic setting. No issues noted or reported in session. The pt is making steady progress toward goals and should benefit from continued PT to progress toward unmet goals.   OBJECTIVE IMPAIRMENTS Abnormal gait, decreased activity tolerance, decreased balance, decreased endurance, decreased mobility, difficulty walking, decreased strength, and postural dysfunction.   ACTIVITY LIMITATIONS community activity and community fitness .   PERSONAL FACTORS  PMH:  Cerebellar atrophy with ataxia, chronic low back pain s/p radiofrequency ablation 3/22; trigger point injections for L tennis elbow; myofascial pain and fibromyalgia.  lumbar spondylosis.  DM, neuropathy, closed foot fracture fall of 2022, HLD, PE, spinal stenosis, are also affecting patient's functional outcome.    REHAB POTENTIAL: Good  CLINICAL DECISION MAKING: Evolving/moderate complexity  EVALUATION COMPLEXITY: Moderate  PLAN: PT FREQUENCY: 2x/week  PT DURATION: 8 weeks  (per recert 0/12/2723)  PLANNED INTERVENTIONS: Therapeutic exercises, Therapeutic activity, Neuromuscular re-education, Balance training, Gait training, Patient/Family education, Joint mobilization, DME instructions, Aquatic Therapy, and Manual therapy  PLAN FOR NEXT SESSION: .   Continue to work towards LTGs-short distance gait, progression of standing exercises, stair negotiation for when pt can use pool at her apartment complex. Continue to progress lower extremity strength and standing balance. Standing exercises at counter-head turns/nods, lateral or A/P weightshifting, reaching tasks.     Willow Ora, PTA, [facility] [address], [place], [place] [address] [phone] 10/21/21, 1:36 PM

## 2021-10-26 DIAGNOSIS — M62838 Other muscle spasm: Secondary | ICD-10-CM | POA: Diagnosis not present

## 2021-10-26 DIAGNOSIS — R262 Difficulty in walking, not elsewhere classified: Secondary | ICD-10-CM | POA: Diagnosis not present

## 2021-10-26 DIAGNOSIS — M6289 Other specified disorders of muscle: Secondary | ICD-10-CM | POA: Diagnosis not present

## 2021-10-26 DIAGNOSIS — R3915 Urgency of urination: Secondary | ICD-10-CM | POA: Diagnosis not present

## 2021-10-26 DIAGNOSIS — M6281 Muscle weakness (generalized): Secondary | ICD-10-CM | POA: Diagnosis not present

## 2021-10-27 ENCOUNTER — Ambulatory Visit: Payer: Medicare Other | Admitting: Physical Therapy

## 2021-10-27 ENCOUNTER — Encounter: Payer: Self-pay | Admitting: Physical Therapy

## 2021-10-27 ENCOUNTER — Other Ambulatory Visit: Payer: Self-pay | Admitting: Internal Medicine

## 2021-10-27 DIAGNOSIS — R2689 Other abnormalities of gait and mobility: Secondary | ICD-10-CM | POA: Diagnosis not present

## 2021-10-27 DIAGNOSIS — M7711 Lateral epicondylitis, right elbow: Secondary | ICD-10-CM | POA: Diagnosis not present

## 2021-10-27 DIAGNOSIS — M6281 Muscle weakness (generalized): Secondary | ICD-10-CM | POA: Diagnosis not present

## 2021-10-27 DIAGNOSIS — R29818 Other symptoms and signs involving the nervous system: Secondary | ICD-10-CM | POA: Diagnosis not present

## 2021-10-27 DIAGNOSIS — R2681 Unsteadiness on feet: Secondary | ICD-10-CM

## 2021-10-27 DIAGNOSIS — R270 Ataxia, unspecified: Secondary | ICD-10-CM | POA: Diagnosis not present

## 2021-10-27 NOTE — Therapy (Signed)
OUTPATIENT PHYSICAL THERAPY NEURO TREATMENT   Patient Name: Joyce Brown MRN: [id number] DOB:21-Apr-1950, 71 y.o., female Today's Date: 10/27/2021  PCP: Talbert Cage, MD REFERRING PROVIDER: Marcial Pacas, MD      END OF SESSION:              Past Medical History:  Diagnosis Date   Cerebellar ataxia ([place])    Diabetes mellitus without complication ([place])    Diabetic neuropathy ([place]) 12/09/2020   Foot fracture 2022   Gait abnormality 11/07/2018   Hyperlipidemia    Pulmonary embolism ([place])    Sleep apnea    Spinal stenosis    Past Surgical History:  Procedure Laterality Date   ABDOMINAL HYSTERECTOMY     ADENOIDECTOMY     ANKLE SURGERY Left    BILATERAL CARPAL TUNNEL RELEASE     BREAST BIOPSY Left 2019   benign   ELBOW SURGERY     GASTRIC BYPASS  2014   HAND NEUROPLASTY     radio denervation  05/21/2020   TONSILLECTOMY     Patient Active Problem List   Diagnosis Date Noted   H/O bariatric surgery 06/29/2021   Bilateral tennis elbow 06/15/2021   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   Type 1 diabetes mellitus with diabetic polyneuropathy ([place]) 12/25/2020   Diabetic neuropathy ([place]) 12/09/2020   Myofascial pain 09/12/2020   Wheelchair dependence 09/12/2020   Dyslipidemia 06/19/2020   Spondylosis without myelopathy or radiculopathy, lumbar region 04/27/2020   Sensorineural hearing loss (SNHL) of both ears 08/21/2019   Tinnitus, bilateral 06/27/2019   FH: ovarian cancer in first degree relative 11/16/2018   Hypercholesteremia 11/15/2018   Gait abnormality 11/07/2018   Pain of left hip joint 05/18/2018   Chronic midline low back pain with bilateral sciatica 05/18/2018   Breast calcifications 12/28/2017   Fibromyalgia 12/06/2017   History of gastric bypass 12/06/2017   Spinal stenosis 12/06/2017   Asthma 12/06/2017   Cerebellar ataxia ([place])    Type I diabetes mellitus with complication, uncontrolled    Serum albumin decreased 11/10/2017    Vitamin B6 deficiency 11/10/2017   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   Hypoglycemia due to type 1 diabetes mellitus ([place]) 06/20/2016   Hyperparathyroidism ([place]) 10/29/2014   Increased PTH level 10/01/2014   Obstructive sleep apnea on CPAP 10/01/2014   Non-organic enuresis 05/28/2014   Urge incontinence of urine 05/28/2014   History of vaginal hysterectomy 05/24/2014   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   Pain of lumbar facet joint 04/11/2013   Mild persistent asthma without complication 40/37/5436   Carpal tunnel syndrome 06/77/0340   Lichen planus 35/24/8185   Adjustment disorder with mixed anxiety and depressed mood 05/11/2012   Degeneration of lumbar intervertebral disc 04/26/2012   Pain in joint involving ankle and foot 04/26/2012   Osteoarthritis of hand 05/24/2011   Leukopenia 09/25/2009    ONSET DATE: 07/06/2021 (MD referral); initial symptoms 2007  REFERRING DIAG: G11.9 (ICD-10-CM) - Cerebellar ataxia ([place]) R26.9 (ICD-10-CM) - Gait abnormality   THERAPY DIAG:  No diagnosis found.  PERTINENT HISTORY: Has Cerebellar atrophy with ataxia and also chronic low back pain s/p radiofrequency ablation 3/22; Has had trigger point injections for L tennis elbow. Has has Myofascial pain and fibromyalgia.  lumbar spondylosis, chronic LBP  PMH:  DM, neuropathy, closed foot fracture fall of 2022, HLD, PE, spinal stenosis, tennis elbow  PRECAUTIONS: Fall   -hx of Type 1 DM, carries glucose tablets with her; follows the "Rule of 15"  PATIENT GOALS: Pt's goals for therapy are to increase strength and mobility.     SUBJECTIVE: Having a good morning. Has not been able to check out the fitness center d/t the door not working.    PAIN:  Are you having pain? No    TODAY'S  TREATMENT:     TODAY'S TREATMENT: 10/28/21 Activity Comments                      Access Code: [email or web address] URL: [email or web address] Date: 10/15/21 Access Code: [email or web address] URL: [email or web address] Date: 10/15/2021 Prepared by: [facility]  Exercises - Seated Gluteal Sets  - 1 x daily - 7 x weekly - 2 sets - 10 reps - 3 sec hold - Seated Active Hip Flexion  - 1 x daily - 7 x weekly - 2 sets - 10 reps - 5 sec hold - Seated Shoulder Row with Anchored Resistance  - 1 x daily - 5 x weekly - 2 sets - 10 reps - Seated Shoulder Extension and Scapular Retraction with Resistance  - 1 x daily - 5 x weekly - 2 sets - 10 reps - Modified Thomas Stretch  - 1 x daily - 5 x weekly - 2 sets - 30 sec hold - Sit to Stand with Counter Support  - 1 x daily - 5 x weekly - 2 sets - 5 reps - Lower Trunk Rotations  - 1-2 x daily - 7 x weekly - 1 sets - 5-10 reps - Hooklying Isometric Clamshell  - 1 x daily - 5 x weekly - 1-2 sets - 10 reps - Seated Heel Raise  - 1 x daily - 5 x weekly - 1-2 sets - 10 reps - Standing Hip Abduction with Counter Support  - 1 x daily - 5 x weekly - 2 sets - 10 reps - Staggered Stance Forward Backward Weight Shift with Counter Support  - 1 x daily - 5 x weekly - 2 sets - 10 reps - Supine Quad Set  - 1 x daily - 5 x weekly - 2 sets - 10 reps - Supine Active Straight Leg Raise  - 1 x daily - 5 x weekly - 2 sets - 10 reps - Seated Long Arc Quad with Ankle Weight  - 1 x daily - 5 x weekly - 2 sets - 10 reps   PATIENT EDUCATION: Education details: HEP update; discussion on continued fitness after DC Person educated: Patient Education method: Explanation, Demonstration, Tactile cues, Verbal cues, and Handouts Education comprehension: verbalized understanding, returned demonstration, verbal cues required, and tactile cues required    GOALS:  FOR RECERT 7/90/2409: SHORT TERM GOALS: Target date:  09/30/2021  Pt will be independent with progression of HEP for improved strength, balance, gait and progression to use of apartment complex pool for exercise. Baseline: Goal status: GOAL MET, 10/01/2021  2.  Pt will improve 5x sit<>stand to less with no LOB and mod independently, to demonstrate improved functional strength and transfer efficiency. Baseline: decreased eccentric control; supervision Goal status: GOAL MET, 10/01/21  LONG TERM GOALS: Target date: 10/28/2021  Pt will be independent with final progression of HEP for improved strength, balance, transfers, and gait. Baseline:  Goal status: IN PROGRESS  2.  Pt will verbalize understanding of fall prevention in home environment.   Baseline:  Goal Status:  IN PROGRESS  3.  Pt will improve TUG score to less than or equal to 35 sec for decreased fall risk. Baseline:  43.82 sec 09/02/2021; 10/01/21 55.97 sec with RW Goal status:  IN PROGRESS  4.  Berg Balance test to improve to at least 33/56 for decreased fall risk. Baseline: 19/56 07/22/21 at start of care; 28/56 on 08/19/21 Goal status: IN PROGRESS  5.  Pt will verbalize plans for ongoing community fitness, including aquatic fitness options, upon d/c from PT. Baseline:  Goal status: IN PROGRESS   CLINICAL IMPRESSION: Patient arrived to session without new complaints. Patient performed static and dynamic balance activities in parallel bars for safety. Patient with improved awareness of forward flexed posture but fatigues with short duration of standing activities, requiring short sitting rest breaks. Patient with concerns about her UE strength d/t fatigue, thus explained importance of continued postural strengthening to avoid excessive support on arms with balance activities. No complaints at end of session.   OBJECTIVE IMPAIRMENTS Abnormal gait, decreased activity tolerance, decreased balance, decreased endurance, decreased mobility, difficulty walking, decreased strength, and  postural dysfunction.   ACTIVITY LIMITATIONS community activity and community fitness .   PERSONAL FACTORS  PMH:  Cerebellar atrophy with ataxia, chronic low back pain s/p radiofrequency ablation 3/22; trigger point injections for L tennis elbow; myofascial pain and fibromyalgia.  lumbar spondylosis.  DM, neuropathy, closed foot fracture fall of 2022, HLD, PE, spinal stenosis, are also affecting patient's functional outcome.    REHAB POTENTIAL: Good  CLINICAL DECISION MAKING: Evolving/moderate complexity  EVALUATION COMPLEXITY: Moderate  PLAN: PT FREQUENCY: 2x/week  PT DURATION: 8 weeks  (per recert 3/35/8251)  PLANNED INTERVENTIONS: Therapeutic exercises, Therapeutic activity, Neuromuscular re-education, Balance training, Gait training, Patient/Family education, Joint mobilization, DME instructions, Aquatic Therapy, and Manual therapy  PLAN FOR NEXT SESSION: .  Continue to work towards LTGs-short distance gait, progression of standing exercises, stair negotiation for when pt can use pool at her apartment complex. Continue to progress lower extremity strength and standing balance. Standing exercises at counter-head turns/nods, lateral or A/P weightshifting, reaching tasks.      Janene Harvey, PT, DPT 10/27/21 9:49 AM  [facility] at [facility] [place], [place], [place] [address] Phone # [phone] Fax # [phone]

## 2021-10-27 NOTE — Therapy (Signed)
OUTPATIENT PHYSICAL THERAPY NEURO TREATMENT   Patient Name: Joyce Brown MRN: [id number] DOB:February 03, 1951, 71 y.o., female Today's Date: 10/27/2021  PCP: Talbert Cage, MD REFERRING PROVIDER: Marcial Pacas, MD      END OF SESSION:   PT End of Session - 10/27/21 1010     Visit Number 25    Number of Visits 30    Date for PT Re-Evaluation 10/28/21    Authorization Type Medicare/AARP    Progress Note Due on Visit 69    PT Start Time 0846    PT Stop Time 0930    PT Time Calculation (min) 44 min    Equipment Utilized During Treatment Other (comment)   pool noodles, single bar bells   Activity Tolerance Patient tolerated treatment well    Behavior During Therapy WFL for tasks assessed/performed                     Past Medical History:  Diagnosis Date   Cerebellar ataxia ([place])    Diabetes mellitus without complication ([place])    Diabetic neuropathy ([place]) 12/09/2020   Foot fracture 2022   Gait abnormality 11/07/2018   Hyperlipidemia    Pulmonary embolism ([place])    Sleep apnea    Spinal stenosis    Past Surgical History:  Procedure Laterality Date   ABDOMINAL HYSTERECTOMY     ADENOIDECTOMY     ANKLE SURGERY Left    BILATERAL CARPAL TUNNEL RELEASE     BREAST BIOPSY Left 2019   benign   ELBOW SURGERY     GASTRIC BYPASS  2014   HAND NEUROPLASTY     radio denervation  05/21/2020   TONSILLECTOMY     Patient Active Problem List   Diagnosis Date Noted   H/O bariatric surgery 06/29/2021   Bilateral tennis elbow 06/15/2021   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   Type 1 diabetes mellitus with diabetic polyneuropathy ([place]) 12/25/2020   Diabetic neuropathy ([place]) 12/09/2020   Myofascial pain 09/12/2020   Wheelchair dependence 09/12/2020   Dyslipidemia 06/19/2020   Spondylosis without myelopathy or radiculopathy, lumbar region 04/27/2020   Sensorineural hearing loss (SNHL) of both ears 08/21/2019   Tinnitus, bilateral 06/27/2019   FH: ovarian cancer  in first degree relative 11/16/2018   Hypercholesteremia 11/15/2018   Gait abnormality 11/07/2018   Pain of left hip joint 05/18/2018   Chronic midline low back pain with bilateral sciatica 05/18/2018   Breast calcifications 12/28/2017   Fibromyalgia 12/06/2017   History of gastric bypass 12/06/2017   Spinal stenosis 12/06/2017   Asthma 12/06/2017   Cerebellar ataxia (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   Pain of lumbar facet joint 04/11/2013   Mild persistent asthma without complication 40/98/1191   Carpal tunnel syndrome 47/82/9562   Lichen planus 13/10/6576   Adjustment disorder with mixed anxiety and depressed mood 05/11/2012   Degeneration of lumbar intervertebral disc 04/26/2012   Pain in joint involving ankle and foot 04/26/2012   Osteoarthritis of hand 05/24/2011   Leukopenia 09/25/2009  ONSET DATE: 07/06/2021 (MD referral); initial symptoms 2007  REFERRING DIAG: G11.9 (ICD-10-CM) - Cerebellar ataxia ([place]) R26.9 (ICD-10-CM) - Gait abnormality   THERAPY DIAG:  Unsteadiness on feet  Muscle weakness (generalized)  Other abnormalities of gait and mobility  PERTINENT HISTORY: Has Cerebellar atrophy with ataxia and also chronic low back pain s/p radiofrequency ablation 3/22; Has had trigger point injections for L tennis elbow. Has has Myofascial pain and fibromyalgia.  lumbar spondylosis, chronic LBP  PMH:  DM, neuropathy, closed foot fracture fall of 2022, HLD, PE, spinal  stenosis, tennis elbow                                                                                                                                                                                            PRECAUTIONS: Fall   -hx of Type 1 DM, carries glucose tablets with her; follows the "Rule of 15"  PATIENT GOALS: Pt's goals for therapy are to increase strength and mobility.     SUBJECTIVE: Has been in her apartment pool 3 times since last aquatic session. Reports use of pool noodles and waist belt. "I flailed around a little, however did not go under water". No falls or pain to reported.      PAIN:  Are you having pain? No    TODAY'S TREATMENT:  Aquatic therapy at [facility] - pool temp 90 degrees   Patient seen for aquatic therapy today.  Treatment took place in water 3.5-4.5 feet deep depending upon activity.  Pt entered/exited pool via single middle rail with step to pattern and CGA for safety to continue to simulate home pool set up.    Use of 2 yellow noodles   Cues with all gait for hip extension/knee extension to improve posture Forward x 8 laps total with cues for posture/hip extension, step placement and weight shifting Backward x 8 laps with cues for posture and step length with noodles Side stepping left<>right x 5 laps toward each side with cues for posture and step length with noodles   At wall in ~4.0 foot water depth with UE support on pool wall: facing the wall pt performed the following ex's with cues on form/technique, min guard assist for safety. Heel toe raises x 20 reps Alternating slow high knee marching  20 reps each side Alternating HS curls x 20 reps each side Alternating hip abd/add x 20 reps each side Alternating hip extension x 20 reps each side   With back to wall in ~4.0 foot water depth with single bar bells: Alternating punching for 10 reps Shoulder horizontal abd/add x 10 reps Holding them out at sides: alternating  bringing one down under  water to legs<>back to water surface for 10 reps each side Holding them out in front: alternating bringing one down under water to legs<>back to water surface for 10 reps each side.        Use of yellow noodles for gait from deeper water to stairs to exit pool with min/mod assist.       Pt requires buoyancy of water for support for reduced fall risk with gait training and balance exercises with minimal UE support; exercises able to be performed safely in water without the risk of fall compared to those same exercises performed on land;  viscosity of water needed for resistance for strengthening.  Current of water provides perturbations for challenging static & dynamic standing balance.       HOME EXERCISE PROGRAM:  Access Code: [email or web address] URL: [email or web address] Date: 10/15/21 Access Code: [email or web address] URL: [email or web address] Date: 10/15/2021 Prepared by: [facility]  Exercises - Seated Gluteal Sets  - 1 x daily - 7 x weekly - 2 sets - 10 reps - 3 sec hold - Seated Active Hip Flexion  - 1 x daily - 7 x weekly - 2 sets - 10 reps - 5 sec hold - Seated Shoulder Row with Anchored Resistance  - 1 x daily - 5 x weekly - 2 sets - 10 reps - Seated Shoulder Extension and Scapular Retraction with Resistance  - 1 x daily - 5 x weekly - 2 sets - 10 reps - Modified Thomas Stretch  - 1 x daily - 5 x weekly - 2 sets - 30 sec hold - Sit to Stand with Counter Support  - 1 x daily - 5 x weekly - 2 sets - 5 reps - Lower Trunk Rotations  - 1-2 x daily - 7 x weekly - 1 sets - 5-10 reps - Hooklying Isometric Clamshell  - 1 x daily - 5 x weekly - 1-2 sets - 10 reps - Seated Heel Raise  - 1 x daily - 5 x weekly - 1-2 sets - 10 reps - Standing Hip Abduction with Counter Support  - 1 x daily - 5 x weekly - 2 sets - 10 reps - Staggered Stance Forward Backward Weight Shift with Counter Support  - 1 x daily - 5 x weekly - 2 sets - 10 reps -  Supine Quad Set  - 1 x daily - 5 x weekly - 2 sets - 10 reps - Supine Active Straight Leg Raise  - 1 x daily - 5 x weekly - 2 sets - 10 reps - Seated Long Arc Quad with Ankle Weight  - 1 x daily - 5 x weekly - 2 sets - 10 reps   PATIENT EDUCATION: Education details: continue with current HEP Person educated: Patient Education method: Explanation, Demonstration, Tactile cues, Verbal cues, and Handouts Education comprehension: verbalized understanding, returned demonstration, verbal cues required, and tactile cues required    GOALS:  FOR RECERT 2/48/2500: SHORT TERM GOALS: Target date: 09/30/2021  Pt will be independent with progression of HEP for improved strength, balance, gait and progression to use of apartment complex pool for exercise. Baseline: Goal status: GOAL MET, 10/01/2021  2.  Pt will improve 5x sit<>stand to less with no LOB and mod independently, to demonstrate improved functional strength and transfer efficiency. Baseline: decreased eccentric control; supervision Goal status: GOAL MET, 10/01/21  LONG TERM GOALS: Target date: 10/28/2021  Pt will be independent with final progression  of HEP for improved strength, balance, transfers, and gait. Baseline:  Goal status: IN PROGRESS  2.  Pt will verbalize understanding of fall prevention in home environment.   Baseline:  Goal Status:  IN PROGRESS  3.  Pt will improve TUG score to less than or equal to 35 sec for decreased fall risk. Baseline:  43.82 sec 09/02/2021; 10/01/21 55.97 sec with RW Goal status:  IN PROGRESS  4.  Berg Balance test to improve to at least 33/56 for decreased fall risk. Baseline: 19/56 07/22/21 at start of care; 28/56 on 08/19/21 Goal status: IN PROGRESS  5.  Pt will verbalize plans for ongoing community fitness, including aquatic fitness options, upon d/c from PT. Baseline:  Goal status: IN PROGRESS   CLINICAL IMPRESSION:  Today's skilled session continued to focus on gait, strengthening and  balance in the aquatic setting. No issues noted or reported in session. The pt appears to have a good understanding of safe ways to continue with aquatic ex's at home.  OBJECTIVE IMPAIRMENTS Abnormal gait, decreased activity tolerance, decreased balance, decreased endurance, decreased mobility, difficulty walking, decreased strength, and postural dysfunction.   ACTIVITY LIMITATIONS community activity and community fitness .   PERSONAL FACTORS  PMH:  Cerebellar atrophy with ataxia, chronic low back pain s/p radiofrequency ablation 3/22; trigger point injections for L tennis elbow; myofascial pain and fibromyalgia.  lumbar spondylosis.  DM, neuropathy, closed foot fracture fall of 2022, HLD, PE, spinal stenosis, are also affecting patient's functional outcome.    REHAB POTENTIAL: Good  CLINICAL DECISION MAKING: Evolving/moderate complexity  EVALUATION COMPLEXITY: Moderate  PLAN: PT FREQUENCY: 2x/week  PT DURATION: 8 weeks  (per recert 09/23/9288)  PLANNED INTERVENTIONS: Therapeutic exercises, Therapeutic activity, Neuromuscular re-education, Balance training, Gait training, Patient/Family education, Joint mobilization, DME instructions, Aquatic Therapy, and Manual therapy  PLAN FOR NEXT SESSION: .   LTGs due.    Willow Ora, PTA, [facility] [address] [place], [place] [address] [phone] 10/27/21, 10:11 AM

## 2021-10-28 ENCOUNTER — Encounter: Payer: Self-pay | Admitting: Physical Therapy

## 2021-10-28 ENCOUNTER — Ambulatory Visit: Payer: Medicare Other | Admitting: Physical Therapy

## 2021-10-28 DIAGNOSIS — R2689 Other abnormalities of gait and mobility: Secondary | ICD-10-CM | POA: Diagnosis not present

## 2021-10-28 DIAGNOSIS — M7711 Lateral epicondylitis, right elbow: Secondary | ICD-10-CM | POA: Diagnosis not present

## 2021-10-28 DIAGNOSIS — R2681 Unsteadiness on feet: Secondary | ICD-10-CM | POA: Diagnosis not present

## 2021-10-28 DIAGNOSIS — R270 Ataxia, unspecified: Secondary | ICD-10-CM | POA: Diagnosis not present

## 2021-10-28 DIAGNOSIS — M6281 Muscle weakness (generalized): Secondary | ICD-10-CM | POA: Diagnosis not present

## 2021-10-28 DIAGNOSIS — R29818 Other symptoms and signs involving the nervous system: Secondary | ICD-10-CM | POA: Diagnosis not present

## 2021-10-28 NOTE — Patient Instructions (Signed)

## 2021-10-29 DIAGNOSIS — K146 Glossodynia: Secondary | ICD-10-CM | POA: Insufficient documentation

## 2021-10-29 DIAGNOSIS — K121 Other forms of stomatitis: Secondary | ICD-10-CM | POA: Diagnosis not present

## 2021-11-04 ENCOUNTER — Telehealth: Payer: Self-pay | Admitting: Podiatry

## 2021-11-04 ENCOUNTER — Other Ambulatory Visit: Payer: Self-pay | Admitting: Internal Medicine

## 2021-11-04 DIAGNOSIS — E1042 Type 1 diabetes mellitus with diabetic polyneuropathy: Secondary | ICD-10-CM

## 2021-11-04 NOTE — Telephone Encounter (Signed)
Spoke with patient to advise that we will send out paperwork to Dr. Kelton Pillar office to be signed once signed we will contact her to pick up shoes. Shoes are in office we just need new signed paperwork

## 2021-11-10 DIAGNOSIS — N3941 Urge incontinence: Secondary | ICD-10-CM | POA: Diagnosis not present

## 2021-11-10 DIAGNOSIS — M6281 Muscle weakness (generalized): Secondary | ICD-10-CM | POA: Diagnosis not present

## 2021-11-10 DIAGNOSIS — M6289 Other specified disorders of muscle: Secondary | ICD-10-CM | POA: Diagnosis not present

## 2021-11-10 DIAGNOSIS — M62838 Other muscle spasm: Secondary | ICD-10-CM | POA: Diagnosis not present

## 2021-11-18 ENCOUNTER — Telehealth: Payer: Self-pay | Admitting: Physical Medicine and Rehabilitation

## 2021-11-18 NOTE — Telephone Encounter (Signed)
Pt called requesting a call back to set an appt. Please call pt at [phone].

## 2021-11-19 ENCOUNTER — Telehealth: Payer: Self-pay | Admitting: Physical Medicine and Rehabilitation

## 2021-11-19 NOTE — Telephone Encounter (Signed)
Pt called and need to reschedule. Pt states she just spoke with [facility] and realized she cant come on that date. Please call pt at [phone].

## 2021-11-23 ENCOUNTER — Encounter: Payer: Self-pay | Admitting: Family Medicine

## 2021-11-26 ENCOUNTER — Encounter: Payer: Medicare Other | Attending: Internal Medicine | Admitting: Dietician

## 2021-11-26 ENCOUNTER — Encounter: Payer: Self-pay | Admitting: Dietician

## 2021-11-26 ENCOUNTER — Telehealth: Payer: Self-pay | Admitting: Internal Medicine

## 2021-11-26 DIAGNOSIS — E1142 Type 2 diabetes mellitus with diabetic polyneuropathy: Secondary | ICD-10-CM | POA: Diagnosis not present

## 2021-11-26 NOTE — Progress Notes (Unsigned)
No chief complaint on file.     ASSESSMENT AND PLAN  Joyce Brown is a 71 y.o. 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 in the past, but patient never have significant muscle stiffness,  Symptoms has plateaued around 2007 after 2 years of rapid decline,  No significant worsening taking CellCept 1000 mg twice a day, refilled her prescription,  Laboratory evaluations,  Discussed with patient, will proceed with MRI of the brain to see the progression of her cerebellar atrophy,  Worsening urinary incontinence,  Brisk reflex,  MRI of cervical spine to rule out cervical spondylitic myelopathy,  Lumbar spondylosis, chronic low back pain   DIAGNOSTIC DATA (LABS, IMAGING, TESTING) - I reviewed patient records, labs, notes, testing and imaging myself where available.  In 2023, A1C 6.3, ENA RNP antibody 1.8, normal CMP, creat 0.8, CBC, angiotensin-converting enzyme, protein electrophoresis, Lyme titer, B12, TSH,   MEDICAL HISTORY:  Joyce Brown is a 71 year old female, previous patient of Dr. Jannifer Franklin, for evaluation of ataxia, her primary care physician is Dr. Erin Hearing, Jeb Levering, MD   I reviewed and summarized the referring note.  Past medical history  Hyperlipidemia Diabetes Depression  Patient was seen by Dr. Jannifer Franklin since October 2019, after she moved from [place] to [place] to be close to her sister.  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, she was treated with IVIG since 2007, was receiving 1 g/kg every 2 weeks, along with CellCept 500 mg 2 tablets twice a day  Her symptoms has been plateaued since 2007, there was no significant up-and-down since then, she now has mild dysarthria, but no dysphagia, rely on her wheelchair, walking with her leg sitting in wheelchair, mild disc Machi of upper extremity, denies significant vision problem  She denied family history of similar issues,  She also have severe incontinence, urinary urgency, incontinence, taking oxybutynin 5 mg 1 and half tablets twice a day, has extreme dry mouth, seeing urologist regularly  She lives alone in an apartment with 2 cats, by HKristopher [place] she use her wheelchair left walking to the [employer] grocery shopping, able to cook simple microwave meal, she denies memory loss, she had severe low back pain, much improved with radiofrequency treatment by Dr. NErnestina Patches Since she was seen by Dr. WJannifer Franklinin 2019, her 2 weekly IVIG was stopped, maintained on CellCept 500 mg 2 tablets twice a day, she did not notice any significant difference, patient initially was also on diazepam, but she denies significant muscle stiffness,  Laboratory evaluations in 2022 showed normal CBC, CMP normal creatinine 0.79 with exception of mild elevated glucose 142, negative Lyme titer, protein electrophoresis, angiotensin-converting enzyme, positive ANA, low titer ENA RNP antibody, normal B12,  She has insulin-dependent diabetes, previously was not under good control A1c up to 9.7 in October 2020, now has much improved A1c was 6.15 December 2020. Personally reviewed MRI lumbar Jan 2022 Multilevel spondylosis. Lumbar dextrocurvature and slight reversal  of lordosis.   Severe left L1-2 and moderate right L4-5 neural foraminal narrowing.   Mild spinal canal narrowing at the L1-3, L4-5 levels.   Mild left L2-3, bilateral L3-4, left  L4-5 and bilateral L5-S1 neural foraminal narrowing.  CT head in September 2019, no acute abnormality, evidence of cerebellar atrophy   PHYSICAL EXAM:   There were no vitals filed for this visit.  Not recorded     There is no height or weight on file to calculate BMI.  PHYSICAL EXAMNIATION:  Gen: NAD, conversant, well nourised, well groomed                     Cardiovascular: Regular rate rhythm, no peripheral edema, warm, nontender. Eyes: Conjunctivae clear without exudates or hemorrhage Neck: Supple, no carotid bruits. Pulmonary: Clear to auscultation bilaterally   NEUROLOGICAL EXAM:  MENTAL STATUS: Speech: Mild slow slurred scanned speech, Cognition:     Orientation to time, place and person     Normal recent and remote memory     Normal Attention span and concentration     Normal Language, naming, repeating,spontaneous speech     Fund of knowledge   CRANIAL NERVES: CN II: Visual fields are full to confrontation. Pupils are round equal and briskly reactive to light. CN III, IV, VI: extraocular movement are normal. No ptosis. CN V: Facial sensation is intact to light touch CN VII: Face is symmetric with normal eye closure  CN VIII: Hearing is normal to causal conversation. CN IX, X: Phonation is normal. CN XI: Head turning and shoulder shrug are intact  MOTOR: No muscle weakness  REFLEXES: Reflexes are 2 and symmetric at the biceps, triceps, knees, and ankles. Plantar responses are flexor.  SENSORY: Intact to light touch, pinprick and vibratory sensation are intact in fingers and toes.  COORDINATION: Mild finger-to-nose dysmetria, symmetric, severe heel-to-shin dysmetria.  GAIT/STANCE: Deferred  REVIEW OF SYSTEMS:  Full 14 system review of systems performed and notable only for as above All other review of systems were negative.   ALLERGIES: No Known Allergies  HOME MEDICATIONS: Current Outpatient Medications  Medication Sig Dispense Refill    acetaminophen (TYLENOL) 325 MG tablet Take 1,000 mg by mouth every 6 (six) hours as needed for moderate pain.     atorvastatin (LIPITOR) 80 MG tablet Take 1 tablet (80 mg total) by mouth daily. 90 tablet 3   BD INSULIN SYRINGE U/F 1/2UNIT 31G X 5/16" 0.3 ML MISC USE 3 TIMES A DAY 100 each 3   carboxymethylcellulose (REFRESH PLUS) 0.5 % SOLN Apply to eye.     Cholecalciferol (VITAMIN D3) 75 MCG (3000 UT) TABS Take 75 mcg by mouth daily.      Continuous Blood Gluc Receiver (DEXCOM G7 RECEIVER) DEVI Use as directed 1 each 0   Continuous Blood Gluc Sensor (DEXCOM G7 SENSOR) MISC by Does not apply route.     DROPLET PEN NEEDLES 31G X 5 MM MISC USE 4 TIMES DAILY AS DIRECTED BY YOUR DOCTOR 200 each 3   gabapentin (NEURONTIN) 100 MG capsule TAKE ONE CAPSULE BY MOUTH THREE TIMES A DAY 90 capsule 0   Glucagon (GVOKE HYPOPEN 1-PACK) 1 MG/0.2ML SOAJ Inject 1 mg into the skin as directed. 0.2 mL 1   glucose blood test strip ASCENSIA CONTOUR NEXT ONE: Use 1 strip to check blood sugar daily 100 each 2   insulin aspart (NOVOLOG FLEXPEN) 100 UNIT/ML FlexPen Max daily 40 units 45 mL 3   insulin degludec (TRESIBA FLEXTOUCH)  100 UNIT/ML FlexTouch Pen Inject 15 Units into the skin daily. (Patient taking differently: Inject 13 Units into the skin daily.) 15 mL 3   Microlet Lancets MISC 3 (three) times daily.     Multiple Vitamins-Minerals (BARIATRIC FUSION) CHEW Chew 2 tablets by mouth 2 (two) times daily.     mycophenolate (CELLCEPT) 500 MG tablet 1 tablet (500 mg total). 2 Tablets in the morning and 2 tablet in the evening     MYRBETRIQ 50 MG TB24 tablet Take 50 mg by mouth daily.     sertraline (ZOLOFT) 50 MG tablet Take by mouth.     tiZANidine (ZANAFLEX) 2 MG tablet TAKE 1 TO 2 TABLETS BY MOUTH AT BEDTIME AS NEEDED FOR MUSCLE SPASMS 60 tablet 11   No current facility-administered medications for this visit.    PAST MEDICAL HISTORY: Past Medical History:  Diagnosis Date   Cerebellar ataxia ([place])    Diabetes  mellitus without complication ([place])    Diabetic neuropathy ([place]) 12/09/2020   Foot fracture 2022   Gait abnormality 11/07/2018   Hyperlipidemia    Pulmonary embolism ([facility])    Sleep apnea    Spinal stenosis     PAST SURGICAL HISTORY: Past Surgical History:  Procedure Laterality Date   ABDOMINAL HYSTERECTOMY     ADENOIDECTOMY     ANKLE SURGERY Left    BILATERAL CARPAL TUNNEL RELEASE     BREAST BIOPSY Left 2019   benign   ELBOW SURGERY     GASTRIC BYPASS  2014   HAND NEUROPLASTY     radio denervation  05/21/2020   TONSILLECTOMY      FAMILY HISTORY: Family History  Problem Relation Age of Onset   Cervical cancer Mother    Ovarian cancer Mother    Dementia Father    Retinal detachment Father    Heart attack Father    Diabetes Father    Stroke Father    Hypertension Father    Ovarian cancer Sister    Arthritis Sister    Arthritis Sister    Arthritis Sister    Glaucoma Paternal Grandfather     SOCIAL HISTORY: Social History   Socioeconomic History   Marital status: Single    Spouse name: Not on file   Number of children: 0   Years of education: 16   Highest education level: Bachelor's degree (e.g., BA, AB, BS)  Occupational History   Occupation: Retired  Tobacco Use   Smoking status: Never   Smokeless tobacco: Never  Vaping Use   Vaping Use: Never used  Substance and Sexual Activity   Alcohol use: Never   Drug use: Never   Sexual activity: Not Currently  Other Topics Concern   Not on file  Social History Narrative   Patient lives alone in [place].    Patient has 2 cats- Monica and [place].   Patient has 4 sisters- [place], [place], [place], and [place].   Patient swims for exercise.   Social Determinants of Health   Financial Resource Strain: Low Risk  (11/25/2020)   Overall Financial Resource Strain (CARDIA)    Difficulty of Paying Living Expenses: Not hard at all  Food Insecurity: No Food Insecurity (11/25/2020)   Hunger Vital Sign     Worried About Running Out of Food in the Last Year: Never true    Ran Out of Food in the Last Year: Never true  Transportation Needs: No Transportation Needs (11/25/2020)   PRAPARE - Hydrologist (Medical):  No    Lack of Transportation (Non-Medical): No  Physical Activity: Sufficiently Active (11/25/2020)   Exercise Vital Sign    Days of Exercise per Week: 3 days    Minutes of Exercise per Session: 50 min  Stress: No Stress Concern Present (11/25/2020)   [place]    Feeling of Stress : Only a little  Social Connections: Socially Isolated (11/25/2020)   Social Connection and Isolation Panel [NHANES]    Frequency of Communication with Friends and Family: More than three times a week    Frequency of Social Gatherings with Friends and Family: Twice a week    Attends Religious Services: Never    Marine scientist or Organizations: No    Attends Archivist Meetings: Never    Marital Status: Never married  Intimate Partner Violence: Not At Risk (11/25/2020)   Humiliation, Afraid, Rape, and Kick questionnaire    Fear of Current or Ex-Partner: No    Emotionally Abused: No    Physically Abused: No    Sexually Abused: No

## 2021-11-26 NOTE — Telephone Encounter (Signed)
Patient came in for appointment with Antonieta Iba and was asking if the form for [facility] had been completed so that she can get her sneakers from them.

## 2021-11-26 NOTE — Telephone Encounter (Signed)
I will check onbase to see if form has come through

## 2021-11-26 NOTE — Patient Instructions (Signed)
Add some spinach to your shake. Find other ways to add vegetables to your breakfast.  Journal your intake and exercise.  Compare this to your dexcom readings.  Which meals led to the best results?  Consider discussing carb counting rather than fixed meal dose with your doctor.  You carb count well.  Continue to take your medication as prescribed.

## 2021-11-26 NOTE — Progress Notes (Unsigned)
Diabetes Self-Management Education  Visit Type: Follow-up  Appt. Start Time: 0915 Appt. End Time: 0945  11/30/2021  Joyce Brown, identified by name and date of birth, is a 71 y.o. female with a diagnosis of Diabetes:  .   ASSESSMENT Patient is here today alone.  She was last seen in this office on 05/21/2021. Noted 11 lbs weight loss since 05/2021. Reviewed her In-pen report (and reviewed with MD today) and reviewed her Dexcom report and showed this to patient. Patient's glucose readings are frequently high after breakfast and often stay high all day.   She is injecting her insulin as prescribed (per patient and report).   Insulin dose cannot be increased do to risk of hypoglycemia as there are some days that her blood glucose is well controlled (80-180). Expect increased blood glucose due to food intake. Instructed patient to keep a food log and not exercise on this as well. She may benefit from an Insulin to Carb ratio and entering carbohydrates into her pump rather than a fixed dose.   She stopped coffee due to overactive bladder.  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  She was doing PT 2x per week this spring and was at the [facility] pool for 1 of those treatments.  This ended in mid August.  Her goal is to go to the pool 3x per week.  The fitness center where she lives is currently under renovation.  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   History includes:Type 1 Diabetes since 2007, asthma, cerebellar ataxia, HTN, HLD, OSA on C-pap and sleep study Julyl 6.  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 Dexcom G-6 since 07/11/18.  Inpen  Medication includes Tresiba 14 units q HS and Novolog 6 units before breakfast,12 units before lunch and 8 units before dinner and 1 unit before a snack.  plus sliding scale (insulin sensitivity  factor:  65).   She is not aiming to give this 30 minutes prior to the meal which has improved her blood sugar control. A1C 7.9% 09/28/2021 increased from 6.1% 06/26/2021.   Weight hx: 183 lbs 09/28/2021 194 lbs 05/2021 per patient 185 lbs per patient 11/21/2020 173 lbs 05/15/2020 178 lbs 02/18/2020   Patient lives alone.  She uses a wheel chair.  She is doing her own grocery shopping.  She uses [employer] [facility] for medical appointments but is able to wheel herself to the grocery store.  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 She moved here from [place] last year to be closer to one of her sister's who lives in [place]. The pool at her apartment is outdoors.  Patient enjoys walking in the pool.   She last saw an RD in [place] about 11 months prior to my first visit    Diabetes Self-Management Education - 11/30/21 0949       Visit Information   Visit Type Follow-up      Psychosocial Assessment   Patient Belief/Attitude about Diabetes Motivated to manage diabetes    Self-care barriers None    Self-management support Doctor's office;CDE visits    Other persons present Patient      Pre-Education Assessment   Patient understands the diabetes disease and treatment process. Demonstrates understanding / competency    Patient understands incorporating nutritional management into lifestyle. Needs Review    Patient undertands incorporating physical activity  into lifestyle. Demonstrates understanding / competency    Patient understands using medications safely. Demonstrates understanding / competency    Patient understands monitoring blood glucose, interpreting and using results Demonstrates understanding / competency    Patient understands prevention, detection, and treatment of acute complications. Comprehends key points    Patient understands prevention, detection, and treatment of chronic complications. Demonstrates understanding /  competency    Patient understands how to develop strategies to address psychosocial issues. Demonstrates understanding / competency    Patient understands how to develop strategies to promote health/change behavior. Needs Review      Complications   How often do you check your blood sugar? > 4 times/day    Fasting Blood glucose range (mg/dL) 70-129;130-179;180-200    Postprandial Blood glucose range (mg/dL) >200;180-200;130-179    Number of hypoglycemic episodes per month 2    Can you tell when your blood sugar is low? Yes    Number of hyperglycemic episodes ( >'200mg'$ /dL): Daily    Can you tell when your blood sugar is high? Yes      Dietary Intake   Breakfast [employer] protein shake made with water and blueberries OR overnight oats (1/4 cup greek yogurt, 4 oz oatmilk, 1/4 cup dry oats, 1 scoop protein powder    Snack (morning) none usually    Lunch tuna on sprouted [employer] bagel flat, lettuce, carrots, blueberries    Snack (afternoon) none    Dinner rotissorie chicken, petitie potatoes, (no butter), steamed broccoli OR [employer] [place] style chicken and Petite potatoes    Snack (evening) none    Beverage(s) water, club soda      Activity / Exercise   Activity / Exercise Type Light (walking / raking leaves)    How many days per week do you exercise? 3    How many minutes per day do you exercise? 45    Total minutes per week of exercise 135      Patient Education   Previous Diabetes Education Yes (please comment)   05/2021   Healthy Eating Meal options for control of blood glucose level and chronic complications.;Carbohydrate counting    Being Active Role of exercise on diabetes management, blood pressure control and cardiac health.    Medications Reviewed patients medication for diabetes, action, purpose, timing of dose and side effects.    Monitoring Taught/evaluated CGM (comment)      Individualized Goals (developed by patient)   Nutrition Carb counting    Physical Activity Exercise  3-5 times per week;45 minutes per day    Medications take my medication as prescribed    Monitoring  Consistenly use CGM    Problem Solving Medication consistency;Eating Pattern      Patient Self-Evaluation of Goals - Patient rates self as meeting previously set goals (% of time)   Nutrition >75% (most of the time)    Physical Activity 50 - 75 % (half of the time)    Medications >75% (most of the time)    Monitoring >75% (most of the time)    Problem Solving and behavior change strategies  >75% (most of the time)    Reducing Risk (treating acute and chronic complications) >59% (most of the time)    Health Coping >75% (most of the time)      Post-Education Assessment   Patient understands the diabetes disease and treatment process. Demonstrates understanding / competency    Patient understands incorporating nutritional management into lifestyle. Comprehends key points    Patient undertands incorporating physical  activity into lifestyle. Demonstrates understanding / competency    Patient understands using medications safely. Demonstrates understanding / competency    Patient understands monitoring blood glucose, interpreting and using results Demonstrates understanding / competency    Patient understands prevention, detection, and treatment of acute complications. Demonstrates understanding / competency    Patient understands prevention, detection, and treatment of chronic complications. Demonstrates understanding / competency    Patient understands how to develop strategies to address psychosocial issues. Demonstrates understanding / competency    Patient understands how to develop strategies to promote health/change behavior. Comprehends key points      Outcomes   Expected Outcomes Demonstrated interest in learning. Expect positive outcomes    Future DMSE 6 months    Program Status Not Completed      Subsequent Visit   Since your last visit have you continued or begun to take your  medications as prescribed? Yes    Since your last visit have you had your blood pressure checked? Yes    Since your last visit have you experienced any weight changes? Loss    Weight Loss (lbs) 11             Individualized Plan for Diabetes Self-Management Training:   Learning Objective:  Patient will have a greater understanding of diabetes self-management. Patient education plan is to attend individual and/or group sessions per assessed needs and concerns.   Plan:   Patient Instructions  Add some spinach to your shake. Find other ways to add vegetables to your breakfast.  Journal your intake and exercise.  Compare this to your dexcom readings.  Which meals led to the best results?  Consider discussing carb counting rather than fixed meal dose with your doctor.  You carb count well.  Continue to take your medication as prescribed.   Expected Outcomes:  Demonstrated interest in learning. Expect positive outcomes  Education material provided:   If problems or questions, patient to contact team via:  Phone  Future DSME appointment: 6 months

## 2021-11-27 ENCOUNTER — Ambulatory Visit: Payer: Medicare Other | Admitting: Physical Medicine and Rehabilitation

## 2021-11-27 DIAGNOSIS — R262 Difficulty in walking, not elsewhere classified: Secondary | ICD-10-CM | POA: Diagnosis not present

## 2021-11-27 DIAGNOSIS — M6289 Other specified disorders of muscle: Secondary | ICD-10-CM | POA: Diagnosis not present

## 2021-11-27 DIAGNOSIS — M62838 Other muscle spasm: Secondary | ICD-10-CM | POA: Diagnosis not present

## 2021-11-27 DIAGNOSIS — N3941 Urge incontinence: Secondary | ICD-10-CM | POA: Diagnosis not present

## 2021-11-27 DIAGNOSIS — M6281 Muscle weakness (generalized): Secondary | ICD-10-CM | POA: Diagnosis not present

## 2021-11-30 ENCOUNTER — Encounter: Payer: Self-pay | Admitting: Internal Medicine

## 2021-12-01 ENCOUNTER — Encounter: Payer: Self-pay | Admitting: Neurology

## 2021-12-01 ENCOUNTER — Ambulatory Visit (INDEPENDENT_AMBULATORY_CARE_PROVIDER_SITE_OTHER): Payer: Medicare Other | Admitting: Neurology

## 2021-12-01 VITALS — BP 149/84 | HR 71 | Ht 71.0 in | Wt 177.0 lb

## 2021-12-01 DIAGNOSIS — R269 Unspecified abnormalities of gait and mobility: Secondary | ICD-10-CM

## 2021-12-01 DIAGNOSIS — G119 Hereditary ataxia, unspecified: Secondary | ICD-10-CM | POA: Diagnosis not present

## 2021-12-01 MED ORDER — MYCOPHENOLATE MOFETIL 500 MG PO TABS: 1000.0000 mg | ORAL_TABLET | Freq: Two times a day (BID) | ORAL | 3 refills | Status: DC

## 2021-12-02 ENCOUNTER — Encounter: Payer: Self-pay | Admitting: Internal Medicine

## 2021-12-02 LAB — COMPREHENSIVE METABOLIC PANEL
ALT: 29 IU/L (ref 0–32)
AST: 25 IU/L (ref 0–40)
Albumin/Globulin Ratio: 1.8 (ref 1.2–2.2)
Albumin: 4.2 g/dL (ref 3.9–4.9)
Alkaline Phosphatase: 74 IU/L (ref 44–121)
BUN/Creatinine Ratio: 24 (ref 12–28)
BUN: 24 mg/dL (ref 8–27)
Bilirubin Total: 0.4 mg/dL (ref 0.0–1.2)
CO2: 23 mmol/L (ref 20–29)
Calcium: 10 mg/dL (ref 8.7–10.3)
Chloride: 99 mmol/L (ref 96–106)
Creatinine, Ser: 1 mg/dL (ref 0.57–1.00)
Globulin, Total: 2.3 g/dL (ref 1.5–4.5)
Glucose: 436 mg/dL — ABNORMAL HIGH (ref 70–99)
Potassium: 5.1 mmol/L (ref 3.5–5.2)
Sodium: 138 mmol/L (ref 134–144)
Total Protein: 6.5 g/dL (ref 6.0–8.5)
eGFR: 61 mL/min/{1.73_m2} (ref >59)

## 2021-12-02 LAB — CBC WITH DIFFERENTIAL/PLATELET
Basophils Absolute: 0.1 10*3/uL (ref 0.0–0.2)
Basos: 1 %
EOS (ABSOLUTE): 0.1 10*3/uL (ref 0.0–0.4)
Eos: 2 %
Hematocrit: 39.7 % (ref 34.0–46.6)
Hemoglobin: 12.9 g/dL (ref 11.1–15.9)
Immature Grans (Abs): 0 10*3/uL (ref 0.0–0.1)
Immature Granulocytes: 0 %
Lymphocytes Absolute: 0.9 10*3/uL (ref 0.7–3.1)
Lymphs: 13 %
MCH: 30.3 pg (ref 26.6–33.0)
MCHC: 32.5 g/dL (ref 31.5–35.7)
MCV: 93 fL (ref 79–97)
Monocytes Absolute: 0.5 10*3/uL (ref 0.1–0.9)
Monocytes: 7 %
Neutrophils Absolute: 5.3 10*3/uL (ref 1.4–7.0)
Neutrophils: 77 %
Platelets: 232 10*3/uL (ref 150–450)
RBC: 4.26 x10E6/uL (ref 3.77–5.28)
RDW: 11.9 % (ref 11.7–15.4)
WBC: 6.8 10*3/uL (ref 3.4–10.8)

## 2021-12-04 ENCOUNTER — Ambulatory Visit (INDEPENDENT_AMBULATORY_CARE_PROVIDER_SITE_OTHER): Payer: Medicare Other | Admitting: Physical Medicine and Rehabilitation

## 2021-12-04 ENCOUNTER — Encounter: Payer: Self-pay | Admitting: Physical Medicine and Rehabilitation

## 2021-12-04 VITALS — BP 126/71 | HR 77 | Ht 70.0 in | Wt 177.0 lb

## 2021-12-04 DIAGNOSIS — G119 Hereditary ataxia, unspecified: Secondary | ICD-10-CM | POA: Diagnosis not present

## 2021-12-04 DIAGNOSIS — M47816 Spondylosis without myelopathy or radiculopathy, lumbar region: Secondary | ICD-10-CM | POA: Diagnosis not present

## 2021-12-04 DIAGNOSIS — M545 Low back pain, unspecified: Secondary | ICD-10-CM

## 2021-12-04 DIAGNOSIS — G8929 Other chronic pain: Secondary | ICD-10-CM | POA: Diagnosis not present

## 2021-12-04 NOTE — Progress Notes (Unsigned)
Pt states is having right sided LBP. No leg pain. Taking tylenol. Pain returned during PT and with standing long periods  Numeric Pain Rating Scale and Functional Assessment Average Pain 4   In the last MONTH (on 0-10 scale) has pain interfered with the following?  1. General activity like being  able to carry out your everyday physical activities such as walking, climbing stairs, carrying groceries, or moving a chair?  Rating(7)

## 2021-12-04 NOTE — Progress Notes (Unsigned)
[name] - 71 y.o. female MRN [id number]  Date of birth: [birth year]  Office Visit Note: Visit Date: 12/04/2021 PCP: Lind Covert, MD Referred by: Lind Covert, *  Subjective: Chief Complaint  Patient presents with   Lower Back - Pain   HPI: Joyce Brown is a 71 y.o. female who comes in today Chronic, worsening and severe bilateral lower back pain. Pain ongoing for several years and is exacerbated by standing. She describes pain as constant sore and aching sensation, currently rates as 8 out of 10. Some relief of pain with formal physical therapy, rest and use of medications. Patient recently completed regimen of PT at [facility], she continues to attend water therapy at [facility]. Lumbar MRI imaging from 2022 exhibits dextrocurvature and slight reversal of lordosis, multilevel spondylosis, no high grade spinal canal stenosis noted. Patient underwent bilateral L3-L4, L4-L5 and L5-S1 radiofrequency ablation in our office in March of 2022, she reports greater than 80% relief of pain for over a year with this procedure. She reports increased functionality and ability to perform tasks post ablation. History of previous radiofrequency ablation procedures for many years at [facility] while living in [place]. Patient denies focal weakness, numbness and tingling. Patient denies recent trauma or falls.   Patients course is complicated by history of cerebellar ataxia, she is able to push up from wheelchair, but does require assistance with standing due to balance issues. Currently being managed by Butler Denmark, NP at [facility]. She is also being managed by Dr. Courtney Heys at [facility], good relief of pain with cervical/lumbar trigger point injections performed by Dr. Dagoberto Ligas. She does carry a diagnosis of fibromyalgia.    Review of Systems  Musculoskeletal:  Positive for back pain.  Neurological:   Negative for tingling, sensory change, focal weakness and weakness.  All other systems reviewed and are negative.  Otherwise per HPI.  Assessment & Plan: Visit Diagnoses:    ICD-10-CM   1. Chronic bilateral low back pain without sciatica  M54.50    G89.29     2. Spondylosis without myelopathy or radiculopathy, lumbar region  M47.816     3. Facet arthropathy, lumbar  M47.816     4. Cerebellar ataxia (HCC)  G11.9        Plan: Findings:  Chronic, worsening and severe bilateral axial back pain.  No radicular symptoms.  Patient continues to have severe pain despite good conservative therapy such as formal physical therapy, rest and use of medications.  Patient's clinical presentation and exam are consistent with facet mediated pain.  There is multilevel facet arthropathy noted on lumbar MRI imaging.  Her case is complicated as she does have a history of cerebellar ataxia, notable ataxia noted today with standing upon exam, she does have good strength.  Significant and sustained relief with previous lumbar ablation procedure in 2022.  Next step is to repeat bilateral L3-L4, L4-L5 and L5-S1 radiofrequency ablation under fluoroscopic guidance.  I did discuss radiofrequency ablation procedure with patient, she has no questions at this time.  We can repeat this procedure on a yearly basis if warranted.  Patient encouraged to continue with water therapy as tolerated. No red flag symptoms noted upon exam today.    Meds & Orders: No orders of the defined types were placed in this encounter.  No orders of the defined types were placed in this encounter.   Follow-up: Return for Bilateral L3-L4, L4-L5 and L5-S1 radiofrequency  ablation.   Procedures: No procedures performed      Clinical History: EXAM: MRI LUMBAR SPINE WITHOUT CONTRAST   TECHNIQUE: Multiplanar, multisequence MR imaging of the lumbar spine was performed. No intravenous contrast was administered.   COMPARISON:  03/05/2020 and  prior.   FINDINGS: Segmentation:  Standard.   Alignment: Lumbar dextrocurvature. Slight reversal of lordosis. Minimal grade 1 L4-5 retrolisthesis. Minimal grade 1 L2-3, L3-4 anterolisthesis.   Vertebrae: Vertebral body heights are preserved. Multilevel Modic type 1/2 endplate degenerative changes. No focal osseous lesion.   Conus medullaris and cauda equina: Conus extends to the L1 level. Conus and cauda equina appear normal.   Disc levels: Multilevel desiccation and Schmorl's node formation.   L1-2: Disc bulge with superimposed left foraminal protrusion. Bilateral facet hypertrophy. Patent right neural foramen. Mild spinal canal and severe left neural foraminal narrowing.   L2-3: Disc bulge with superimposed central and left foraminal protrusions. Bilateral facet hypertrophy. Patent right neural foramen. Mild spinal canal and left neural foraminal narrowing.   L3-4: Disc bulge with shallow central and bilateral foraminal protrusions. Small left extraforaminal protrusion. Bilateral facet hypertrophy. Patent spinal canal. Mild bilateral neural foraminal narrowing.   L4-5: Disc bulge with superimposed right foraminal and shallow right paracentral protrusions abutting the descending right L5 nerve root. Bilateral facet hypertrophy. Mild spinal canal, moderate right and mild left neural foraminal narrowing.   L5-S1: Disc bulge with superimposed right foraminal protrusion. Bilateral facet hypertrophy. Patent spinal canal. Mild bilateral neural foraminal narrowing.   Paraspinal and other soft tissues: Small right renal cysts.   IMPRESSION: Multilevel spondylosis. Lumbar dextrocurvature and slight reversal of lordosis.   Severe left L1-2 and moderate right L4-5 neural foraminal narrowing.   Mild spinal canal narrowing at the L1-3, L4-5 levels.   Mild left L2-3, bilateral L3-4, left L4-5 and bilateral L5-S1 neural foraminal narrowing.     Electronically Signed   By:  Primitivo Gauze M.D.   On: 04/03/2020 09:19   She reports that she has never smoked. She has never used smokeless tobacco.  Recent Labs    12/25/20 0957 06/26/21 0955 09/28/21 0909  HGBA1C 6.3* 6.1* 7.9*    Objective:  VS:  HT:'5\' 10"'$  (177.8 cm)   WT:177 lb (80.3 kg)  BMI:25.4    BP:126/71  HR:77bpm  TEMP: ( )  RESP:  Physical Exam Vitals and nursing note reviewed.  HENT:     Head: Normocephalic and atraumatic.     Right Ear: External ear normal.     Left Ear: External ear normal.     Nose: Nose normal.     Mouth/Throat:     Mouth: Mucous membranes are moist.  Eyes:     Extraocular Movements: Extraocular movements intact.  Cardiovascular:     Rate and Rhythm: Normal rate.     Pulses: Normal pulses.  Pulmonary:     Effort: Pulmonary effort is normal.  Abdominal:     General: Abdomen is flat. There is no distension.  Musculoskeletal:        General: Tenderness present.     Cervical back: Normal range of motion.     Comments: Pt is slow to rise from seated position to standing, profound ataxia and balance issues noted. Concordant low back pain with facet loading, lumbar spine extension and rotation. Strong distal strength without clonus, no pain upon palpation of greater trochanters. Sensation intact bilaterally. Ambulates with walker/wheelchair, gait unsteady.  Skin:    General: Skin is warm and dry.  Capillary Refill: Capillary refill takes less than 2 seconds.  Neurological:     Mental Status: She is alert and oriented to person, place, and time.     Gait: Gait abnormal.  Psychiatric:        Mood and Affect: Mood normal.        Behavior: Behavior normal.     Ortho Exam  Imaging: No results found.  Past Medical/Family/Surgical/Social History: Medications & Allergies reviewed per EMR, new medications updated. Patient Active Problem List   Diagnosis Date Noted   H/O bariatric surgery 06/29/2021   Bilateral tennis elbow 06/15/2021   Squamous cell  carcinoma in situ 03/05/2021   Type 1 diabetes mellitus with diabetic polyneuropathy ([place]) 12/25/2020   Diabetic neuropathy ([place]) 12/09/2020   Myofascial pain 09/12/2020   Wheelchair dependence 09/12/2020   Dyslipidemia 06/19/2020   Spondylosis without myelopathy or radiculopathy, lumbar region 04/27/2020   Sensorineural hearing loss (SNHL) of both ears 08/21/2019   Tinnitus, bilateral 06/27/2019   FH: ovarian cancer in first degree relative 11/16/2018   Hypercholesteremia 11/15/2018   Gait abnormality 11/07/2018   Pain of left hip joint 05/18/2018   Chronic midline low back pain with bilateral sciatica 05/18/2018   Breast calcifications 12/28/2017   Fibromyalgia 12/06/2017   History of gastric bypass 12/06/2017   Spinal stenosis 12/06/2017   Asthma 12/06/2017   Cerebellar ataxia ([place])    Type I diabetes mellitus with complication, uncontrolled    Serum albumin decreased 11/10/2017   Vitamin B6 deficiency 11/10/2017   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   Hypoglycemia due to type 1 diabetes mellitus ([place]) 06/20/2016   Hyperparathyroidism ([place]) 10/29/2014   Increased PTH level 10/01/2014   Obstructive sleep apnea on CPAP 10/01/2014   Non-organic enuresis 05/28/2014   Urge incontinence of urine 05/28/2014   History of vaginal hysterectomy 05/24/2014   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   Pain of lumbar facet joint 04/11/2013   Mild persistent asthma without complication 40/34/7425   Carpal tunnel syndrome 95/63/8756   Lichen planus 43/32/9518   Adjustment disorder with mixed anxiety and depressed mood 05/11/2012   Degeneration of lumbar intervertebral disc 04/26/2012   Pain in joint involving ankle and foot 04/26/2012   Osteoarthritis of hand 05/24/2011   Leukopenia 09/25/2009   Past Medical History:  Diagnosis Date   Cerebellar ataxia ([place])    Diabetes mellitus without complication ([place])    Diabetic neuropathy ([place]) 12/09/2020   Foot fracture 2022    Gait abnormality 11/07/2018   Hyperlipidemia    Pulmonary embolism (HCC)    Sleep apnea    Spinal stenosis    Family History  Problem Relation Age of Onset   Cervical cancer Mother    Ovarian cancer Mother    Dementia Father    Retinal detachment Father    Heart attack Father    Diabetes Father    Stroke Father    Hypertension Father    Ovarian cancer Sister    Arthritis Sister    Arthritis Sister    Arthritis Sister    Glaucoma Paternal Grandfather    Past Surgical History:  Procedure Laterality Date   ABDOMINAL HYSTERECTOMY     ADENOIDECTOMY     ANKLE SURGERY Left    BILATERAL CARPAL TUNNEL RELEASE     BREAST BIOPSY Left 2019   benign   ELBOW SURGERY     GASTRIC BYPASS  2014   HAND NEUROPLASTY     radio denervation  05/21/2020   TONSILLECTOMY  Social History   Occupational History   Occupation: Retired  Tobacco Use   Smoking status: Never   Smokeless tobacco: Never  Vaping Use   Vaping Use: Never used  Substance and Sexual Activity   Alcohol use: Never   Drug use: Never   Sexual activity: Not Currently

## 2021-12-07 ENCOUNTER — Telehealth: Payer: Self-pay | Admitting: Physical Medicine and Rehabilitation

## 2021-12-07 NOTE — Telephone Encounter (Signed)
IC appt scheduled.  

## 2021-12-07 NOTE — Telephone Encounter (Signed)
Patient called. Returning a call to schedule with Dr. Newton.  ?

## 2021-12-08 ENCOUNTER — Ambulatory Visit (INDEPENDENT_AMBULATORY_CARE_PROVIDER_SITE_OTHER): Payer: Medicare Other | Admitting: Podiatry

## 2021-12-08 DIAGNOSIS — B351 Tinea unguium: Secondary | ICD-10-CM

## 2021-12-08 DIAGNOSIS — E1042 Type 1 diabetes mellitus with diabetic polyneuropathy: Secondary | ICD-10-CM | POA: Diagnosis not present

## 2021-12-08 DIAGNOSIS — M79675 Pain in left toe(s): Secondary | ICD-10-CM

## 2021-12-08 DIAGNOSIS — M79674 Pain in right toe(s): Secondary | ICD-10-CM

## 2021-12-08 NOTE — Progress Notes (Signed)
  Subjective:  Patient ID: Joyce Brown, female    DOB: 24-Jul-1950,  MRN: [id number]  Raksha Wolfgang presents to clinic today for at risk foot care with history of diabetic neuropathy and painful elongated mycotic toenails 1-5 bilaterally which are tender when wearing enclosed shoe gear. Pain is relieved with periodic professional debridement.  Patient states blood glucose was 113 mg/dl today. Last known  HgA1c was 7.9%.    New problem(s): None.   PCP is Lind Covert, MD , and last visit was June, 2023.  No Known Allergies  Review of Systems: Negative except as noted in the HPI.  Objective: No changes noted in today's physical examination. Denita Lun is a pleasant 71 y.o. female in NAD. AAO x 3. Vascular CFT immediate b/l LE. Palpable DP/PT pulses b/l LE. Digital hair present b/l. Skin temperature gradient WNL b/l. No pain with calf compression b/l. No edema noted b/l. No cyanosis or clubbing noted b/l LE.  Neurologic Normal speech. Oriented to person, place, and time. Protective sensation intact 5/5 intact bilaterally with 10g monofilament b/l. Vibratory sensation intact b/l.  Dermatologic Pedal skin warm and supple b/l.  No open wounds b/l. No interdigital macerations. Toenails 1-5 b/l elongated, thickened, discolored with subungual debris. +Tenderness with dorsal palpation of nailplates.No hyperkeratotic nor porokeratotic lesions noted b/l.  Orthopedic: Muscle strength 5/5 to all lower extremity muscle groups bilaterally. No pain, crepitus or joint limitation noted with ROM bilateral LE. Hammertoe deformity noted 2-5 b/l. Utilizes motorized chair for mobility assistance.   Radiographs: None Assessment/Plan: 1. Pain due to onychomycosis of toenails of both feet   2. Diabetic polyneuropathy associated with type 1 diabetes mellitus ([place])     -Patient was evaluated and treated. All patient's and/or POA's questions/concerns answered on today's visit. -Continue  diabetic foot care principles: inspect feet daily, monitor glucose as recommended by PCP and/or Endocrinologist, and follow prescribed diet per PCP, Endocrinologist and/or dietician. -Continue diabetic shoes daily. -Toenails 1-5 b/l were debrided in length and girth with sterile nail nippers and dremel without iatrogenic bleeding.  -Patient/POA to call should there be question/concern in the interim.   Return in about 3 months (around 03/09/2022).  Marzetta Board, DPM

## 2021-12-09 DIAGNOSIS — H6123 Impacted cerumen, bilateral: Secondary | ICD-10-CM | POA: Diagnosis not present

## 2021-12-09 DIAGNOSIS — H903 Sensorineural hearing loss, bilateral: Secondary | ICD-10-CM | POA: Diagnosis not present

## 2021-12-10 DIAGNOSIS — N3941 Urge incontinence: Secondary | ICD-10-CM | POA: Diagnosis not present

## 2021-12-10 DIAGNOSIS — R262 Difficulty in walking, not elsewhere classified: Secondary | ICD-10-CM | POA: Diagnosis not present

## 2021-12-10 DIAGNOSIS — M6281 Muscle weakness (generalized): Secondary | ICD-10-CM | POA: Diagnosis not present

## 2021-12-10 DIAGNOSIS — M62838 Other muscle spasm: Secondary | ICD-10-CM | POA: Diagnosis not present

## 2021-12-10 DIAGNOSIS — M6289 Other specified disorders of muscle: Secondary | ICD-10-CM | POA: Diagnosis not present

## 2021-12-12 ENCOUNTER — Encounter: Payer: Self-pay | Admitting: Podiatry

## 2021-12-14 ENCOUNTER — Encounter: Payer: Self-pay | Admitting: Family Medicine

## 2021-12-14 DIAGNOSIS — Z23 Encounter for immunization: Secondary | ICD-10-CM | POA: Diagnosis not present

## 2021-12-18 ENCOUNTER — Encounter: Payer: Self-pay | Admitting: Internal Medicine

## 2021-12-27 ENCOUNTER — Encounter: Payer: Self-pay | Admitting: Family Medicine

## 2021-12-28 DIAGNOSIS — H532 Diplopia: Secondary | ICD-10-CM | POA: Diagnosis not present

## 2021-12-28 DIAGNOSIS — H40013 Open angle with borderline findings, low risk, bilateral: Secondary | ICD-10-CM | POA: Diagnosis not present

## 2021-12-30 ENCOUNTER — Ambulatory Visit (INDEPENDENT_AMBULATORY_CARE_PROVIDER_SITE_OTHER): Payer: Medicare Other

## 2021-12-30 DIAGNOSIS — M2041 Other hammer toe(s) (acquired), right foot: Secondary | ICD-10-CM | POA: Diagnosis not present

## 2021-12-30 DIAGNOSIS — E1042 Type 1 diabetes mellitus with diabetic polyneuropathy: Secondary | ICD-10-CM

## 2021-12-30 DIAGNOSIS — M2012 Hallux valgus (acquired), left foot: Secondary | ICD-10-CM

## 2021-12-30 NOTE — Progress Notes (Signed)
Patient presents today to pick up diabetic shoes and insoles.  Patient was dispensed 1 pair of diabetic shoes and 3 pairs of foam casted diabetic insoles. Fit was satisfactory. Instructions for break-in and wear was reviewed and a copy was given to the patient.   Re-appointment for regularly scheduled diabetic foot care visits or if they should experience any trouble with the shoes or insoles.  

## 2021-12-31 ENCOUNTER — Ambulatory Visit (INDEPENDENT_AMBULATORY_CARE_PROVIDER_SITE_OTHER): Payer: Medicare Other

## 2021-12-31 VITALS — BP 112/62 | HR 79 | Ht 70.0 in | Wt 178.0 lb

## 2021-12-31 DIAGNOSIS — Z23 Encounter for immunization: Secondary | ICD-10-CM | POA: Diagnosis not present

## 2021-12-31 DIAGNOSIS — Z0000 Encounter for general adult medical examination without abnormal findings: Secondary | ICD-10-CM | POA: Diagnosis not present

## 2021-12-31 NOTE — Progress Notes (Addendum)
Subjective:   Joyce Brown is a 71 y.o. female who presents for Medicare Annual (Subsequent) preventive examination.  Review of Systems: Defer to PCP.  Cardiac Risk Factors include: advanced age (>102mn, >>58women);diabetes mellitus  Objective:   Vitals: BP 112/62   Pulse 79   Ht '5\' 10"'$  (1.778 m)   Wt 178 lb (80.7 kg)   SpO2 99%   BMI 25.54 kg/m   Body mass index is 25.54 kg/m.     12/31/2021    2:36 PM 08/18/2021    9:42 AM 07/20/2021    8:53 AM 05/18/2021    8:34 AM 04/30/2021    8:57 AM 04/21/2021    8:29 AM 03/05/2021    1:36 PM  Advanced Directives  Does Patient Have a Medical Advance Directive? No No Yes No No No No  Type of AComptrollerLiving will      Does patient want to make changes to medical advance directive?   No - Patient declined      Would patient like information on creating a medical advance directive? Yes (MAU/Ambulatory/Procedural Areas - Information given) No - Patient declined  No - Patient declined No - Patient declined No - Patient declined No - Patient declined   Tobacco Social History   Tobacco Use  Smoking Status Never   Passive exposure: Never  Smokeless Tobacco Never     Clinical Intake:  Pre-visit preparation completed: Yes  Pain : 0-10 Pain Score: 5   Nutritional Status: BMI 25 -29 Overweight Diabetes: Yes CBG done?: No Did pt. 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[facility]    Diabetes mellitus without complication ([facility]    Diabetic neuropathy ([place] 12/09/2020   Foot fracture 2022   Gait abnormality 11/07/2018   Hyperlipidemia    [place]ulmonary embolism ([place]    Sleep apnea     Spinal stenosis    Past Surgical History:  Procedure Laterality Date   ABDOMINAL HYSTERECTOMY     ADENOIDECTOMY     ANKLE SURGERY Left    BILATERAL CARPAL TUNNEL RELEASE     BREAST BIOPSY Left 2019   benign   ELBOW SURGERY     GASTRIC BYPASS  2014   HAND NEUROPLASTY     radio denervation  05/21/2020   TONSILLECTOMY     Family History  Problem Relation Age of Onset   Cervical cancer Mother    Ovarian cancer Mother    Dementia Father    Retinal detachment Father    Heart attack Father    Diabetes Father    Stroke Father    Hypertension Father    Ovarian cancer Sister    Arthritis Sister    Arthritis Sister    Arthritis Sister    Glaucoma Paternal Grandfather    Social History   Socioeconomic History   Marital status: Single    Spouse name: Not on file   Number of children: 0   Years of education: 16   Highest education level: Bachelor's degree (e.g., BA, AB, BS)  Occupational History   Occupation: Retired  Tobacco Use   Smoking status: Never    Passive exposure:  Never   Smokeless tobacco: Never  Vaping Use   Vaping Use: Never used  Substance and Sexual Activity   Alcohol use: Never   Drug use: Never   Sexual activity: Not Currently  Other Topics Concern   Not on file  Social History Narrative   Patient lives alone in [place].    Patient has 1 cat- [place] passed.   Patient has 4 sisters- [place], [place], [place], and [place].   Patient has been walking around her complex for exercise using wheelchair.    Social Determinants of Health   Financial Resource Strain: Low Risk  (12/31/2021)   Overall Financial Resource Strain (CARDIA)    Difficulty of Paying Living Expenses: Not hard at all  Food Insecurity: No Food Insecurity (12/31/2021)   Hunger Vital Sign    Worried About Running Out of Food in the Last Year: Never true    Ran Out of Food in the Last Year: Never true  Transportation Needs: No Transportation Needs (12/31/2021)   PRAPARE  - Hydrologist (Medical): No    Lack of Transportation (Non-Medical): No  Physical Activity: Insufficiently Active (12/31/2021)   Exercise Vital Sign    Days of Exercise per Week: 3 days    Minutes of Exercise per Session: 40 min  Stress: Stress Concern Present (12/31/2021)   [place]    Feeling of Stress : To some extent  Social Connections: Moderately Isolated (12/31/2021)   Social Connection and Isolation Panel [NHANES]    Frequency of Communication with Friends and Family: More than three times a week    Frequency of Social Gatherings with Friends and Family: Once a week    Attends Religious Services: Never    Marine scientist or Organizations: Yes    Attends Archivist Meetings: Never    Marital Status: Never married   Outpatient Encounter Medications as of 12/31/2021  Medication Sig   Acetaminophen 500 MG capsule Take 500 mg by mouth every 6 (six) hours as needed for moderate pain.   atorvastatin (LIPITOR) 80 MG tablet Take 1 tablet (80 mg total) by mouth daily.   BD INSULIN SYRINGE U/F 1/2UNIT 31G X 5/16" 0.3 ML MISC USE 3 TIMES A DAY   Cholecalciferol (VITAMIN D3) 75 MCG (3000 UT) TABS Take 75 mcg by mouth daily.    Continuous Blood Gluc Receiver (DEXCOM G7 RECEIVER) DEVI Use as directed   Continuous Blood Gluc Sensor ([place]) MISC by Does not apply route.   DROPLET PEN NEEDLES 31G X 5 MM MISC USE 4 TIMES DAILY AS DIRECTED BY YOUR DOCTOR (Patient not taking: Reported on 01/06/2022)   estradiol (ESTRACE) 0.1 MG/GM vaginal cream Place vaginally. (Patient not taking: Reported on 01/06/2022)   gabapentin (NEURONTIN) 100 MG capsule TAKE ONE CAPSULE BY MOUTH THREE TIMES A DAY   Glucagon (GVOKE HYPOPEN 1-PACK) 1 MG/0.2ML SOAJ Inject 1 mg into the skin as directed. (Patient not taking: Reported on 01/06/2022)   glucose blood test strip ASCENSIA CONTOUR NEXT ONE: Use  1 strip to check blood sugar daily   insulin aspart (NOVOLOG FLEXPEN) 100 UNIT/ML FlexPen Max daily 40 units (Patient taking differently: 8 units before breakfast, 12 before lunch, 7 units before dinner)   insulin degludec (TRESIBA FLEXTOUCH) 100 UNIT/ML FlexTouch Pen Inject 15 Units into the skin daily.   Microlet Lancets MISC 3 (three) times daily.   Multiple Vitamins-Minerals (BARIATRIC FUSION) CHEW Chew  2 tablets by mouth 2 (two) times daily.   mycophenolate (CELLCEPT) 500 MG tablet Take 2 tablets (1,000 mg total) by mouth 2 (two) times daily.   MYRBETRIQ 50 MG TB24 tablet Take 50 mg by mouth daily. (Patient not taking: Reported on 01/06/2022)   Polyethyl Glycol-Propyl Glycol (SYSTANE FREE OP) Apply 1 drop to eye 3 (three) times daily.   sertraline (ZOLOFT) 50 MG tablet Take by mouth.   tiZANidine (ZANAFLEX) 2 MG tablet TAKE 1 TO 2 TABLETS BY MOUTH AT BEDTIME AS NEEDED FOR MUSCLE SPASMS   carboxymethylcellulose (REFRESH PLUS) 0.5 % SOLN Apply to eye. (Patient not taking: Reported on 12/04/2021)   No facility-administered encounter medications on file as of 12/31/2021.   Activities of Daily Living    12/31/2021    2:37 PM  In your present state of health, do you have any difficulty performing the following activities:  Hearing? 0  Vision? 0  Difficulty concentrating or making decisions? 0  Walking or climbing stairs? 1  Dressing or bathing? 0  Doing errands, shopping? 0  Preparing Food and eating ? N  Using the Toilet? N  In the past six months, have you accidently leaked urine? Y  Do you have problems with loss of bowel control? N  Managing your Medications? N  Managing your Finances? N  Housekeeping or managing your Housekeeping? N   Patient Care Team: Lind Covert, MD as PCP - General (Family Medicine) Terence Lux, MD as Referring Physician (Neurology) [facility], Melanie Crazier, MD as Consulting Physician (Endocrinology)    Assessment:   This is a  routine wellness examination for [employer].  Exercise Activities and Dietary recommendations Current Exercise Habits: Home exercise routine, Type of exercise: walking, Time (Minutes): 40, Frequency (Times/Week): 3, Weekly Exercise (Minutes/Week): 120, Exercise limited by: orthopedic condition(s)   Goals      Weight (lb) < 180 lb (81.6 kg)     Patient wants to continue to lose weight.        Fall Risk    12/31/2021    2:36 PM 09/23/2021    8:53 AM 08/18/2021    9:41 AM 06/15/2021    8:31 AM 05/18/2021    8:33 AM  Fall Risk   Falls in the past year? 1 1 0 0 1  Number falls in past yr: 0 0 0 0 1  Injury with Fall? 0 0 0 0 0  Risk for fall due to : Impaired mobility;Impaired balance/gait       Is the patient's home free of loose throw rugs in walkways, pet beds, electrical cords, etc?   yes      Grab bars in the bathroom? yes      Handrails on the stairs?   yes      Adequate lighting?   yes  Patient rating of health (0-10) scale: 10   Depression Screen    12/31/2021    2:33 PM 09/23/2021    8:54 AM 08/18/2021    9:41 AM 06/15/2021    8:32 AM  PHQ 2/9 Scores  PHQ - 2 Score 0 0 0 2  PHQ- 9 Score   1     Cognitive Function     12/31/2021    2:38 PM 11/25/2020   10:07 AM  6CIT Screen  What Year? 0 points 0 points  What month? 0 points 0 points  What time? 0 points 0 points  Count back from 20 0 points 0 points  Months in reverse 0 points  0 points  Repeat phrase 0 points 0 points  Total Score 0 points 0 points   Immunization History  Administered Date(s) Administered   COVID-19, mRNA, vaccine(Comirnaty)12 years and older 01/01/2022   Fluad Quad(high Dose 65+) 11/20/2019, 12/16/2020   Influenza, High Dose Seasonal PF 01/06/2017   Influenza, Seasonal, Injecte, Preservative Fre 12/15/2007, 12/16/2008, 12/15/2009, 12/18/2010, 12/20/2011, 01/04/2013   Influenza,inj,Quad PF,6+ Mos 12/28/2013, 01/02/2015, 01/01/2016, 12/06/2017, 11/15/2018   Influenza-Unspecified 12/14/2021   PFIZER  Comirnaty(Gray Top)Covid-19 Tri-Sucrose Vaccine 04/29/2019, 05/22/2019, 01/23/2020, 07/01/2020   PFIZER(Purple Top)SARS-COV-2 Vaccination 04/29/2019, 05/22/2019, 01/23/2020   PNEUMOCOCCAL CONJUGATE-20 12/31/2021   Pneumococcal Conjugate-13 03/20/2018   Pneumococcal Polysaccharide-23 02/12/2005, 07/22/2016   Td 02/17/2007   Tdap 11/23/2013, 07/25/2019   Unspecified SARS-COV-2 Vaccination 12/24/2020, 01/01/2022   Zoster Recombinat (Shingrix) 07/21/2017, 11/04/2017   Screening Tests Health Maintenance  Topic Date Due   DTAP VACCINES (1) 11/02/1950   COVID-19 Vaccine (10 - Pfizer risk series) 02/26/2022   HEMOGLOBIN A1C  03/31/2022   OPHTHALMOLOGY EXAM  06/02/2022   Diabetic kidney evaluation - Urine ACR  06/27/2022   FOOT EXAM  06/27/2022   Diabetic kidney evaluation - GFR measurement  12/02/2022   Medicare Annual Wellness (AWV)  01/01/2023   COLONOSCOPY (Pts 45-14yr Insurance coverage will need to be confirmed)  03/16/2023   MAMMOGRAM  10/15/2023   DTaP/Tdap/Td (4 - Td or Tdap) 07/24/2029   TETANUS/TDAP  07/24/2029   Pneumonia Vaccine 71 Years old  Completed   INFLUENZA VACCINE  Completed   DEXA SCAN  Completed   Zoster Vaccines- Shingrix  Completed   HPV VACCINES  Aged Out   Cancer Screenings: Lung: Low Dose CT Chest recommended if Age 69-80years, 20 pack-year currently smoking OR have quit w/in 15years. Patient does not qualify. Breast:  Up to date on Mammogram? Yes   Up to date of Bone Density/Dexa? Yes Colorectal: UTD  Additional Screenings: Hepatitis C Screening: Patient reports negative screening completed. HIV Screening: Completed   Plan:  You received Prevnar 20 today!  You are up to date with with all vaccines and health maintenance!   I have personally reviewed and noted the following in the patient's chart:   Medical and social history Use of alcohol, tobacco or illicit drugs  Current medications and supplements Functional ability and status Nutritional  status Physical activity Advanced directives List of other physicians Hospitalizations, surgeries, and ER visits in previous 12 months Vitals Screenings to include cognitive, depression, and falls Referrals and appointments  In addition, I have reviewed and discussed with patient certain preventive protocols, quality metrics, and best practice recommendations. A written personalized care plan for preventive services as well as general preventive health recommendations were provided to patient.  EDorna Bloom [place] 01/08/2022

## 2022-01-01 ENCOUNTER — Encounter: Payer: Self-pay | Admitting: Family Medicine

## 2022-01-01 DIAGNOSIS — Z23 Encounter for immunization: Secondary | ICD-10-CM | POA: Diagnosis not present

## 2022-01-05 DIAGNOSIS — R3915 Urgency of urination: Secondary | ICD-10-CM | POA: Diagnosis not present

## 2022-01-05 DIAGNOSIS — M6289 Other specified disorders of muscle: Secondary | ICD-10-CM | POA: Diagnosis not present

## 2022-01-05 DIAGNOSIS — M62838 Other muscle spasm: Secondary | ICD-10-CM | POA: Diagnosis not present

## 2022-01-05 DIAGNOSIS — M6281 Muscle weakness (generalized): Secondary | ICD-10-CM | POA: Diagnosis not present

## 2022-01-05 DIAGNOSIS — N3941 Urge incontinence: Secondary | ICD-10-CM | POA: Diagnosis not present

## 2022-01-06 ENCOUNTER — Ambulatory Visit (INDEPENDENT_AMBULATORY_CARE_PROVIDER_SITE_OTHER): Payer: Medicare Other | Admitting: Physical Medicine and Rehabilitation

## 2022-01-06 ENCOUNTER — Ambulatory Visit: Payer: Self-pay

## 2022-01-06 VITALS — BP 130/64 | HR 72

## 2022-01-06 DIAGNOSIS — M47816 Spondylosis without myelopathy or radiculopathy, lumbar region: Secondary | ICD-10-CM

## 2022-01-06 MED ORDER — METHYLPREDNISOLONE ACETATE 80 MG/ML IJ SUSP
40.0000 mg | Freq: Once | INTRAMUSCULAR | Status: AC
  Administered 2022-01-06: 40 mg

## 2022-01-06 NOTE — Patient Instructions (Signed)

## 2022-01-06 NOTE — Progress Notes (Signed)
Numeric Pain Rating Scale and Functional Assessment Average Pain 4   In the last MONTH (on 0-10 scale) has pain interfered with the following?  1. General activity like being  able to carry out your everyday physical activities such as walking, climbing stairs, carrying groceries, or moving a chair?  Rating(8)   +Driver, -BT, -Dye Allergies.  Right side. Some radiation of pain in the legs and stops at knee

## 2022-01-08 NOTE — Patient Instructions (Addendum)
Joyce Brown  Thank you for taking time to come for your Medicare Wellness Visit. I appreciate your ongoing commitment to your health goals. Please review the following plan we discussed and let me know if I can assist you in the future.    These are the goals we discussed:   Goals      Weight (lb) < 180 lb (81.6 kg)     Patient wants to continue to lose weight.        We also discussed recommended health maintenance. You are up to date with everything! Health Maintenance  Topic Date Due   DTAP VACCINES (1) 11/02/1950   COVID-19 Vaccine (10 - Pfizer risk series) 02/26/2022   HEMOGLOBIN A1C  03/31/2022   OPHTHALMOLOGY EXAM  06/02/2022   Diabetic kidney evaluation - Urine ACR  06/27/2022   FOOT EXAM  06/27/2022   Diabetic kidney evaluation - GFR measurement  12/02/2022   Medicare Annual Wellness (AWV)  01/01/2023   COLONOSCOPY (Pts 45-44yr Insurance coverage will need to be confirmed)  03/16/2023   MAMMOGRAM  10/15/2023   DTaP/Tdap/Td (4 - Td or Tdap) 07/24/2029   TETANUS/TDAP  07/24/2029   Pneumonia Vaccine 71 Years old  Completed   INFLUENZA VACCINE  Completed   DEXA SCAN  Completed   Zoster Vaccines- Shingrix  Completed   HPV VACCINES  Aged Out   You received Prevnar 20 today!  You are up to date with with all vaccines and health maintenance!   Preventive Care 695Years and Older, Female Preventive care refers to lifestyle choices and visits with your health care provider that can promote health and wellness. Preventive care visits are also called wellness exams. What can I expect for my preventive care visit? Counseling Your health care provider may ask you questions about your: Medical history, including: Past medical problems. Family medical history. Pregnancy and menstrual history. History of falls. Current health, including: Memory and ability to understand (cognition). Emotional well-being. Home life and relationship well-being. Sexual activity and sexual  health. Lifestyle, including: Alcohol, nicotine or tobacco, and drug use. Access to firearms. Diet, exercise, and sleep habits. Work and work eStatistician Sunscreen use. Safety issues such as seatbelt and bike helmet use. Physical exam Your health care provider will check your: Height and weight. These may be used to calculate your BMI (body mass index). BMI is a measurement that tells if you are at a healthy weight. Waist circumference. This measures the distance around your waistline. This measurement also tells if you are at a healthy weight and may help predict your risk of certain diseases, such as type 2 diabetes and high blood pressure. Heart rate and blood pressure. Body temperature. Skin for abnormal spots. What immunizations do I need?  Vaccines are usually given at various ages, according to a schedule. Your health care provider will recommend vaccines for you based on your age, medical history, and lifestyle or other factors, such as travel or where you work. What tests do I need? Screening Your health care provider may recommend screening tests for certain conditions. This may include: Lipid and cholesterol levels. Hepatitis C test. Hepatitis B test. HIV (human immunodeficiency virus) test. STI (sexually transmitted infection) testing, if you are at risk. Lung cancer screening. Colorectal cancer screening. Diabetes screening. This is done by checking your blood sugar (glucose) after you have not eaten for a while (fasting). Mammogram. Talk with your health care provider about how often you should have regular mammograms. BRCA-related cancer screening. This may be  done if you have a family history of breast, ovarian, tubal, or peritoneal cancers. Bone density scan. This is done to screen for osteoporosis. Talk with your health care provider about your test results, treatment options, and if necessary, the need for more tests. Follow these instructions at home: Eating and  drinking  Eat a diet that includes fresh fruits and vegetables, whole grains, lean protein, and low-fat dairy products. Limit your intake of foods with high amounts of sugar, saturated fats, and salt. Take vitamin and mineral supplements as recommended by your health care provider. Do not drink alcohol if your health care provider tells you not to drink. If you drink alcohol: Limit how much you have to 0-1 drink a day. Know how much alcohol is in your drink. In the [place] one drink equals one 12 oz bottle of beer (355 mL), one 5 oz glass of wine (148 mL), or one 1 oz glass of hard liquor (44 mL). Lifestyle Brush your teeth every morning and night with fluoride toothpaste. Floss one time each day. Exercise for at least 30 minutes 5 or more days each week. Do not use any products that contain nicotine or tobacco. These products include cigarettes, chewing tobacco, and vaping devices, such as e-cigarettes. If you need help quitting, ask your health care provider. Do not use drugs. If you are sexually active, practice safe sex. Use a condom or other form of protection in order to prevent STIs. Take aspirin only as told by your health care provider. Make sure that you understand how much to take and what form to take. Work with your health care provider to find out whether it is safe and beneficial for you to take aspirin daily. Ask your health care provider if you need to take a cholesterol-lowering medicine (statin). Find healthy ways to manage stress, such as: Meditation, yoga, or listening to music. Journaling. Talking to a trusted person. Spending time with friends and family. Minimize exposure to UV radiation to reduce your risk of skin cancer. Safety Always wear your seat belt while driving or riding in a vehicle. Do not drive: If you have been drinking alcohol. Do not ride with someone who has been drinking. When you are tired or distracted. While texting. If you have been using any  mind-altering substances or drugs. Wear a helmet and other protective equipment during sports activities. If you have firearms in your house, make sure you follow all gun safety procedures. What's next? Visit your health care provider once a year for an annual wellness visit. Ask your health care provider how often you should have your eyes and teeth checked. Stay up to date on all vaccines. This information is not intended to replace advice given to you by your health care provider. Make sure you discuss any questions you have with your health care provider. Document Revised: 08/27/2020 Document Reviewed: 08/27/2020 Elsevier Patient Education  [place] [facility]'s number is [phone]. Please call with questions or concerns about what we discussed today.

## 2022-01-13 NOTE — Progress Notes (Signed)
Joyce Brown - 71 y.o. female MRN [id number]  Date of birth: 1950-10-05  Office Visit Note: Visit Date: 01/06/2022 PCP: Lind Covert, MD Referred by: Lind Covert, *  Subjective: Chief Complaint  Patient presents with   Lower Back - Pain   HPI:  Joyce Brown is a 71 y.o. female who comes in todayfor planned repeat radiofrequency ablation of the Right L3-4, L4-5, L5-S1, and L4-L5  Lumbar facet joints. This would be ablation of the corresponding medial branches and/or dorsal rami.  Patient has had double diagnostic blocks with more than 70% relief.  Subsequent ablation gave them more than 6 months of over 60% relief.  They have had chronic back pain for quite some time, more than 3 months, which has been an ongoing situation with recalcitrant axial back pain.  They have no radicular pain.  Their axial pain is worse with standing and ambulating and on exam today with facet loading.  They have had physical therapy as well as home exercise program.  The imaging noted in the chart below indicated facet pathology. Accordingly they meet all the criteria and qualification for for radiofrequency ablation and we are going to complete this today hopefully for more longer term relief as part of comprehensive management program.   ROS Otherwise per HPI.  Assessment & Plan: Visit Diagnoses:    ICD-10-CM   1. Spondylosis without myelopathy or radiculopathy, lumbar region  M47.816 XR C-ARM NO REPORT    Radiofrequency,Lumbar    methylPREDNISolone acetate (DEPO-MEDROL) injection 40 mg      Plan: No additional findings.   Meds & Orders:  Meds ordered this encounter  Medications   methylPREDNISolone acetate (DEPO-MEDROL) injection 40 mg    Orders Placed This Encounter  Procedures   Radiofrequency,Lumbar   XR C-ARM NO REPORT    Follow-up: Return for visit to requesting provider as needed.   Procedures: No procedures performed  Lumbar Facet Joint Nerve  Denervation  Patient: Joyce Brown      Date of Birth: 1950-10-05 MRN: [id number] PCP: Lind Covert, MD      Visit Date: 01/06/2022   Universal Protocol:    Date/Time: 11/01/235:43 AM  Consent Given By: the patient  Position: PRONE  Additional Comments: Vital signs were monitored before and after the procedure. Patient was prepped and draped in the usual sterile fashion. The correct patient, procedure, and site was verified.   Injection Procedure Details:   Procedure diagnoses:  1. Spondylosis without myelopathy or radiculopathy, lumbar region      Meds Administered:  Meds ordered this encounter  Medications   methylPREDNISolone acetate (DEPO-MEDROL) injection 40 mg     Laterality: Right  Location/Site:  L3-L4, L2 and L3 medial branches, L4-L5, L3 and L4 medial branches, and L5-S1, L4 medial branch and L5 dorsal ramus  Needle: 18 ga.,  95m active tip, 1029mRF Cannula  Needle Placement: Along juncture of superior articular process and transverse pocess  Findings:  -Comments:  Procedure Details: For each desired target nerve, the corresponding transverse process (sacral ala for the L5 dorsal rami) was identified and the fluoroscope was positioned to square off the endplates of the corresponding vertebral body to achieve a true AP midline view.  The beam was then obliqued 15 to 20 degrees and caudally tilted 15 to 20 degrees to line up a trajectory along the target nerves. The skin over the target of the junction of superior articulating process and transverse process (sacral ala for the  L5 dorsal rami) was infiltrated with 30m of 1% Lidocaine without Epinephrine.  The 18 gauge 166mactive tip outer cannula was advanced in trajectory view to the target.  This procedure was repeated for each target nerve.  Then, for all levels, the outer cannula placement was fine-tuned and the position was then confirmed with bi-planar imaging.    Test stimulation was  done both at sensory and motor levels to ensure there was no radicular stimulation. The target tissues were then infiltrated with 1 ml of 1% Lidocaine without Epinephrine. Subsequently, a percutaneous neurotomy was carried out for 90 seconds at 80 degrees Celsius.  After the completion of the lesion, 1 ml of injectate was delivered. It was then repeated for each facet joint nerve mentioned above. Appropriate radiographs were obtained to verify the probe placement during the neurotomy.   Additional Comments:  The patient tolerated the procedure well Dressing: 2 x 2 sterile gauze and Band-Aid    Post-procedure details: Patient was observed during the procedure. Post-procedure instructions were reviewed.  Patient left the clinic in stable condition.      Clinical History: EXAM: MRI LUMBAR SPINE WITHOUT CONTRAST   TECHNIQUE: Multiplanar, multisequence MR imaging of the lumbar spine was performed. No intravenous contrast was administered.   COMPARISON:  03/05/2020 and prior.   FINDINGS: Segmentation:  Standard.   Alignment: Lumbar dextrocurvature. Slight reversal of lordosis. Minimal grade 1 L4-5 retrolisthesis. Minimal grade 1 L2-3, L3-4 anterolisthesis.   Vertebrae: Vertebral body heights are preserved. Multilevel Modic type 1/2 endplate degenerative changes. No focal osseous lesion.   Conus medullaris and cauda equina: Conus extends to the L1 level. Conus and cauda equina appear normal.   Disc levels: Multilevel desiccation and Schmorl's node formation.   L1-2: Disc bulge with superimposed left foraminal protrusion. Bilateral facet hypertrophy. Patent right neural foramen. Mild spinal canal and severe left neural foraminal narrowing.   L2-3: Disc bulge with superimposed central and left foraminal protrusions. Bilateral facet hypertrophy. Patent right neural foramen. Mild spinal canal and left neural foraminal narrowing.   L3-4: Disc bulge with shallow central and  bilateral foraminal protrusions. Small left extraforaminal protrusion. Bilateral facet hypertrophy. Patent spinal canal. Mild bilateral neural foraminal narrowing.   L4-5: Disc bulge with superimposed right foraminal and shallow right paracentral protrusions abutting the descending right L5 nerve root. Bilateral facet hypertrophy. Mild spinal canal, moderate right and mild left neural foraminal narrowing.   L5-S1: Disc bulge with superimposed right foraminal protrusion. Bilateral facet hypertrophy. Patent spinal canal. Mild bilateral neural foraminal narrowing.   Paraspinal and other soft tissues: Small right renal cysts.   IMPRESSION: Multilevel spondylosis. Lumbar dextrocurvature and slight reversal of lordosis.   Severe left L1-2 and moderate right L4-5 neural foraminal narrowing.   Mild spinal canal narrowing at the L1-3, L4-5 levels.   Mild left L2-3, bilateral L3-4, left L4-5 and bilateral L5-S1 neural foraminal narrowing.     Electronically Signed   By: ChPrimitivo Gauze.D.   On: 04/03/2020 09:19     Objective:  VS:  HT:    WT:   BMI:     BP:130/64  HR:72bpm  TEMP: ( )  RESP:  Physical Exam Vitals and nursing note reviewed.  Constitutional:      General: She is not in acute distress.    Appearance: Normal appearance. She is not ill-appearing.  HENT:     Head: Normocephalic and atraumatic.     Right Ear: External ear normal.     Left Ear: External  ear normal.  Eyes:     Extraocular Movements: Extraocular movements intact.  Cardiovascular:     Rate and Rhythm: Normal rate.     Pulses: Normal pulses.  Pulmonary:     Effort: Pulmonary effort is normal. No respiratory distress.  Abdominal:     General: There is no distension.     Palpations: Abdomen is soft.  Musculoskeletal:        General: Tenderness present.     Cervical back: Neck supple.     Right lower leg: No edema.     Left lower leg: No edema.     Comments: Patient has good distal  strength with no pain over the greater trochanters.  No clonus or focal weakness.  Skin:    Findings: No erythema, lesion or rash.  Neurological:     General: No focal deficit present.     Mental Status: She is alert and oriented to person, place, and time.     Sensory: No sensory deficit.     Motor: No weakness or abnormal muscle tone.     Coordination: Coordination normal.  Psychiatric:        Mood and Affect: Mood normal.        Behavior: Behavior normal.      Imaging: No results found.

## 2022-01-13 NOTE — Procedures (Signed)
Lumbar Facet Joint Nerve Denervation  Patient: Joyce Brown      Date of Birth: Apr 19, 1950 MRN: [id number] PCP: Lind Covert, MD      Visit Date: 01/06/2022   Universal Protocol:    Date/Time: 11/01/235:43 AM  Consent Given By: the patient  Position: PRONE  Additional Comments: Vital signs were monitored before and after the procedure. Patient was prepped and draped in the usual sterile fashion. The correct patient, procedure, and site was verified.   Injection Procedure Details:   Procedure diagnoses:  1. Spondylosis without myelopathy or radiculopathy, lumbar region      Meds Administered:  Meds ordered this encounter  Medications   methylPREDNISolone acetate (DEPO-MEDROL) injection 40 mg     Laterality: Right  Location/Site:  L3-L4, L2 and L3 medial branches, L4-L5, L3 and L4 medial branches, and L5-S1, L4 medial branch and L5 dorsal ramus  Needle: 18 ga.,  28m active tip, 1084mRF Cannula  Needle Placement: Along juncture of superior articular process and transverse pocess  Findings:  -Comments:  Procedure Details: For each desired target nerve, the corresponding transverse process (sacral ala for the L5 dorsal rami) was identified and the fluoroscope was positioned to square off the endplates of the corresponding vertebral body to achieve a true AP midline view.  The beam was then obliqued 15 to 20 degrees and caudally tilted 15 to 20 degrees to line up a trajectory along the target nerves. The skin over the target of the junction of superior articulating process and transverse process (sacral ala for the L5 dorsal rami) was infiltrated with 68m62mf 1% Lidocaine without Epinephrine.  The 18 gauge 58m34mtive tip outer cannula was advanced in trajectory view to the target.  This procedure was repeated for each target nerve.  Then, for all levels, the outer cannula placement was fine-tuned and the position was then confirmed with bi-planar imaging.     Test stimulation was done both at sensory and motor levels to ensure there was no radicular stimulation. The target tissues were then infiltrated with 1 ml of 1% Lidocaine without Epinephrine. Subsequently, a percutaneous neurotomy was carried out for 90 seconds at 80 degrees Celsius.  After the completion of the lesion, 1 ml of injectate was delivered. It was then repeated for each facet joint nerve mentioned above. Appropriate radiographs were obtained to verify the probe placement during the neurotomy.   Additional Comments:  The patient tolerated the procedure well Dressing: 2 x 2 sterile gauze and Band-Aid    Post-procedure details: Patient was observed during the procedure. Post-procedure instructions were reviewed.  Patient left the clinic in stable condition.

## 2022-01-14 ENCOUNTER — Ambulatory Visit (INDEPENDENT_AMBULATORY_CARE_PROVIDER_SITE_OTHER): Payer: Medicare Other | Admitting: Physical Medicine and Rehabilitation

## 2022-01-14 ENCOUNTER — Ambulatory Visit: Payer: Medicare Other

## 2022-01-14 VITALS — BP 127/78 | HR 76

## 2022-01-14 DIAGNOSIS — M47816 Spondylosis without myelopathy or radiculopathy, lumbar region: Secondary | ICD-10-CM | POA: Diagnosis not present

## 2022-01-14 MED ORDER — METHYLPREDNISOLONE ACETATE 80 MG/ML IJ SUSP
40.0000 mg | Freq: Once | INTRAMUSCULAR | Status: AC
  Administered 2022-01-14: 40 mg

## 2022-01-14 NOTE — Patient Instructions (Signed)

## 2022-01-14 NOTE — Progress Notes (Signed)
Numeric Pain Rating Scale and Functional Assessment Average Pain 4   In the last MONTH (on 0-10 scale) has pain interfered with the following?  1. General activity like being  able to carry out your everyday physical activities such as walking, climbing stairs, carrying groceries, or moving a chair?  Rating(6)   +Driver, -BT, -Dye Allergies.  Standing makes pain worse

## 2022-01-18 ENCOUNTER — Encounter: Payer: Self-pay | Admitting: Physical Medicine and Rehabilitation

## 2022-01-18 ENCOUNTER — Encounter
Payer: Medicare Other | Attending: Physical Medicine and Rehabilitation | Admitting: Physical Medicine and Rehabilitation

## 2022-01-18 VITALS — BP 138/71 | HR 74

## 2022-01-18 DIAGNOSIS — M7918 Myalgia, other site: Secondary | ICD-10-CM | POA: Diagnosis not present

## 2022-01-18 MED ORDER — LIDOCAINE HCL 1 % IJ SOLN
6.0000 mL | Freq: Once | INTRAMUSCULAR | Status: AC
  Administered 2022-01-18: 6 mL

## 2022-01-18 NOTE — Progress Notes (Signed)
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 Has has Myofascial pain and fibromyalgia.  Here for f/u on cerebellar ataxia and trigger point injections.   Had a couple more Radiofrequency ablations-  Last week-  Going pretty good! By Dr Ernestina Patches- 10/25 and 11/2- lumbar injections/RFA   PT and OT she had this summer, really helped.  Had made her a splint for tennis elbow- For L side Was partially immobilization- worked well.  Not using anymore-  Continuing HEP- usually does something every day.   Stopped pool in August- due to Weather getting colder.   W/c can be replaced 07/26/2022  Spot in R low back reacted strongly to RFA-  This AM, feels painful just around spot, but had been running down leg.  But not in past few days.   Really wants trigger point injections in rhomboids mainly.    Plan: W/C to be replaced 07/26/2022. Gets via [employer]- thinks she would strongly benefit from power assist wheels due to age and shoulder issues and neck/between shoulder blades when pushes manual w/c regularly. Will send for W/C evaluation in April 2024 when comes for that f/u.   2. Patient here for trigger point injections for  Consent done and on chart.  Cleaned areas with alcohol and injected using a 27 gauge 1.5 inch needle  Injected 3cc- 3cc wasted Using 1% Lidocaine with no EPI  Upper traps B/L  Levators Posterior scalenes Middle scalenes Splenius Capitus- B/L  Pectoralis Major Rhomboids- upper rhomboids- B/L  Infraspinatus Teres Major/minor Thoracic paraspinals Lumbar paraspinals Other injections-    Patient's level of pain prior was 4/10  Current level of pain after injections is getting better  There was no  bleeding or complications.  Patient was advised to drink a lot of water on day after injections to flush system Will have increased soreness for 12-48 hours after injections.  Can use Lidocaine patches the day AFTER injections Can use theracane on day of injections in places didn't inject Can use heating pad 4-6 hours AFTER injections    I spent a total of 18 minutes-    minutes on total care today- >50% coordination of care- due to 7 minutes on injections and 11 minutes discussing meds as well as power assist w/c in future and what is necessary to get that.

## 2022-01-18 NOTE — Patient Instructions (Signed)
Plan: W/C to be replaced 07/26/2022. Gets via [employer]- thinks she would strongly benefit from power assist wheels due to age and shoulder issues and neck/between shoulder blades when pushes manual w/c regularly. Will send for W/C evaluation in April 2024 when comes for that f/u.   2. Patient here for trigger point injections for  Consent done and on chart.  Cleaned areas with alcohol and injected using a 27 gauge 1.5 inch needle  Injected 3cc-  Using 1% Lidocaine with no EPI  Upper traps B/L  Levators Posterior scalenes Middle scalenes Splenius Capitus- B/L  Pectoralis Major Rhomboids- upper rhomboids- B/L  Infraspinatus Teres Major/minor Thoracic paraspinals Lumbar paraspinals Other injections-    Patient's level of pain prior was 4/10  Current level of pain after injections is getting better  There was no bleeding or complications.  Patient was advised to drink a lot of water on day after injections to flush system Will have increased soreness for 12-48 hours after injections.  Can use Lidocaine patches the day AFTER injections Can use theracane on day of injections in places didn't inject Can use heating pad 4-6 hours AFTER injections

## 2022-01-18 NOTE — Progress Notes (Signed)
Joyce Brown - 71 y.o. female MRN [id number]  Date of birth: [birth year]  Office Visit Note: Visit Date: 01/14/2022 PCP: Lind Covert, MD Referred by: Lind Covert, *  Subjective: Chief Complaint  Patient presents with   Lower Back - Pain   HPI:  Joyce Brown is a 71 y.o. female who comes in todayfor planned repeat radiofrequency ablation of the Left L3-4, L4-5, L5-S1, and L4-L5  Lumbar facet joints. This would be ablation of the corresponding medial branches and/or dorsal rami.  Patient has had double diagnostic blocks with more than 70% relief.  Subsequent ablation gave them more than 6 months of over 60% relief.  They have had chronic back pain for quite some time, more than 3 months, which has been an ongoing situation with recalcitrant axial back pain.  They have no radicular pain.  Their axial pain is worse with standing and ambulating and on exam today with facet loading.  They have had physical therapy as well as home exercise program.  The imaging noted in the chart below indicated facet pathology. Accordingly they meet all the criteria and qualification for for radiofrequency ablation and we are going to complete this today hopefully for more longer term relief as part of comprehensive management program.   ROS Otherwise per HPI.  Assessment & Plan: Visit Diagnoses:    ICD-10-CM   1. Spondylosis without myelopathy or radiculopathy, lumbar region  M47.816 XR C-ARM NO REPORT    Radiofrequency,Lumbar    methylPREDNISolone acetate (DEPO-MEDROL) injection 40 mg      Plan: No additional findings.   Meds & Orders:  Meds ordered this encounter  Medications   methylPREDNISolone acetate (DEPO-MEDROL) injection 40 mg    Orders Placed This Encounter  Procedures   Radiofrequency,Lumbar   XR C-ARM NO REPORT    Follow-up: Return for visit to requesting provider as needed.   Procedures: No procedures performed  Lumbar Facet Joint Nerve  Denervation  Patient: Joyce Brown      Date of Birth: Sep 10, 1950 MRN: [id number] PCP: Lind Covert, MD      Visit Date: 01/14/2022   Universal Protocol:    Date/Time: 11/06/238:02 PM  Consent Given By: the patient  Position: PRONE  Additional Comments: Vital signs were monitored before and after the procedure. Patient was prepped and draped in the usual sterile fashion. The correct patient, procedure, and site was verified.   Injection Procedure Details:   Procedure diagnoses:  1. Spondylosis without myelopathy or radiculopathy, lumbar region      Meds Administered:  Meds ordered this encounter  Medications   methylPREDNISolone acetate (DEPO-MEDROL) injection 40 mg     Laterality: Left  Location/Site:  L3-L4, L2 and L3 medial branches, L4-L5, L3 and L4 medial branches, and L5-S1, L4 medial branch and L5 dorsal ramus  Needle: 18 ga.,  75m active tip, 1026mRF Cannula  Needle Placement: Along juncture of superior articular process and transverse pocess  Findings:  -Comments:  Procedure Details: For each desired target nerve, the corresponding transverse process (sacral ala for the L5 dorsal rami) was identified and the fluoroscope was positioned to square off the endplates of the corresponding vertebral body to achieve a true AP midline view.  The beam was then obliqued 15 to 20 degrees and caudally tilted 15 to 20 degrees to line up a trajectory along the target nerves. The skin over the target of the junction of superior articulating process and transverse process (sacral ala for the  L5 dorsal rami) was infiltrated with 65m of 1% Lidocaine without Epinephrine.  The 18 gauge 169mactive tip outer cannula was advanced in trajectory view to the target.  This procedure was repeated for each target nerve.  Then, for all levels, the outer cannula placement was fine-tuned and the position was then confirmed with bi-planar imaging.    Test stimulation was done  both at sensory and motor levels to ensure there was no radicular stimulation. The target tissues were then infiltrated with 1 ml of 1% Lidocaine without Epinephrine. Subsequently, a percutaneous neurotomy was carried out for 90 seconds at 80 degrees Celsius.  After the completion of the lesion, 1 ml of injectate was delivered. It was then repeated for each facet joint nerve mentioned above. Appropriate radiographs were obtained to verify the probe placement during the neurotomy.   Additional Comments:  The patient tolerated the procedure well Dressing: 2 x 2 sterile gauze and Band-Aid    Post-procedure details: Patient was observed during the procedure. Post-procedure instructions were reviewed.  Patient left the clinic in stable condition.      Clinical History: EXAM: MRI LUMBAR SPINE WITHOUT CONTRAST   TECHNIQUE: Multiplanar, multisequence MR imaging of the lumbar spine was performed. No intravenous contrast was administered.   COMPARISON:  03/05/2020 and prior.   FINDINGS: Segmentation:  Standard.   Alignment: Lumbar dextrocurvature. Slight reversal of lordosis. Minimal grade 1 L4-5 retrolisthesis. Minimal grade 1 L2-3, L3-4 anterolisthesis.   Vertebrae: Vertebral body heights are preserved. Multilevel Modic type 1/2 endplate degenerative changes. No focal osseous lesion.   Conus medullaris and cauda equina: Conus extends to the L1 level. Conus and cauda equina appear normal.   Disc levels: Multilevel desiccation and Schmorl's node formation.   L1-2: Disc bulge with superimposed left foraminal protrusion. Bilateral facet hypertrophy. Patent right neural foramen. Mild spinal canal and severe left neural foraminal narrowing.   L2-3: Disc bulge with superimposed central and left foraminal protrusions. Bilateral facet hypertrophy. Patent right neural foramen. Mild spinal canal and left neural foraminal narrowing.   L3-4: Disc bulge with shallow central and bilateral  foraminal protrusions. Small left extraforaminal protrusion. Bilateral facet hypertrophy. Patent spinal canal. Mild bilateral neural foraminal narrowing.   L4-5: Disc bulge with superimposed right foraminal and shallow right paracentral protrusions abutting the descending right L5 nerve root. Bilateral facet hypertrophy. Mild spinal canal, moderate right and mild left neural foraminal narrowing.   L5-S1: Disc bulge with superimposed right foraminal protrusion. Bilateral facet hypertrophy. Patent spinal canal. Mild bilateral neural foraminal narrowing.   Paraspinal and other soft tissues: Small right renal cysts.   IMPRESSION: Multilevel spondylosis. Lumbar dextrocurvature and slight reversal of lordosis.   Severe left L1-2 and moderate right L4-5 neural foraminal narrowing.   Mild spinal canal narrowing at the L1-3, L4-5 levels.   Mild left L2-3, bilateral L3-4, left L4-5 and bilateral L5-S1 neural foraminal narrowing.     Electronically Signed   By: ChPrimitivo Gauze.D.   On: 04/03/2020 09:19     Objective:  VS:  HT:    WT:   BMI:     BP:127/78  HR:76bpm  TEMP: ( )  RESP:  Physical Exam Vitals and nursing note reviewed.  Constitutional:      General: She is not in acute distress.    Appearance: Normal appearance. She is not ill-appearing.  HENT:     Head: Normocephalic and atraumatic.     Right Ear: External ear normal.     Left Ear: External  ear normal.  Eyes:     Extraocular Movements: Extraocular movements intact.  Cardiovascular:     Rate and Rhythm: Normal rate.     Pulses: Normal pulses.  Pulmonary:     Effort: Pulmonary effort is normal. No respiratory distress.  Abdominal:     General: There is no distension.     Palpations: Abdomen is soft.  Musculoskeletal:        General: Tenderness present.     Cervical back: Neck supple.     Right lower leg: No edema.     Left lower leg: No edema.     Comments: Patient has good distal strength with no  pain over the greater trochanters.  No clonus or focal weakness.  Skin:    Findings: No erythema, lesion or rash.  Neurological:     General: No focal deficit present.     Mental Status: She is alert and oriented to person, place, and time.     Sensory: No sensory deficit.     Motor: Weakness present. No abnormal muscle tone.     Coordination: Coordination abnormal.     Gait: Gait abnormal.  Psychiatric:        Mood and Affect: Mood normal.        Behavior: Behavior normal.      Imaging: No results found.

## 2022-01-18 NOTE — Procedures (Signed)
Lumbar Facet Joint Nerve Denervation  Patient: Joyce Brown      Date of Birth: 12/12/1950 MRN: [id number] PCP: Lind Covert, MD      Visit Date: 01/14/2022   Universal Protocol:    Date/Time: 11/06/238:02 PM  Consent Given By: the patient  Position: PRONE  Additional Comments: Vital signs were monitored before and after the procedure. Patient was prepped and draped in the usual sterile fashion. The correct patient, procedure, and site was verified.   Injection Procedure Details:   Procedure diagnoses:  1. Spondylosis without myelopathy or radiculopathy, lumbar region      Meds Administered:  Meds ordered this encounter  Medications   methylPREDNISolone acetate (DEPO-MEDROL) injection 40 mg     Laterality: Left  Location/Site:  L3-L4, L2 and L3 medial branches, L4-L5, L3 and L4 medial branches, and L5-S1, L4 medial branch and L5 dorsal ramus  Needle: 18 ga.,  65m active tip, 105mRF Cannula  Needle Placement: Along juncture of superior articular process and transverse pocess  Findings:  -Comments:  Procedure Details: For each desired target nerve, the corresponding transverse process (sacral ala for the L5 dorsal rami) was identified and the fluoroscope was positioned to square off the endplates of the corresponding vertebral body to achieve a true AP midline view.  The beam was then obliqued 15 to 20 degrees and caudally tilted 15 to 20 degrees to line up a trajectory along the target nerves. The skin over the target of the junction of superior articulating process and transverse process (sacral ala for the L5 dorsal rami) was infiltrated with 59m42mf 1% Lidocaine without Epinephrine.  The 18 gauge 49m49mtive tip outer cannula was advanced in trajectory view to the target.  This procedure was repeated for each target nerve.  Then, for all levels, the outer cannula placement was fine-tuned and the position was then confirmed with bi-planar imaging.     Test stimulation was done both at sensory and motor levels to ensure there was no radicular stimulation. The target tissues were then infiltrated with 1 ml of 1% Lidocaine without Epinephrine. Subsequently, a percutaneous neurotomy was carried out for 90 seconds at 80 degrees Celsius.  After the completion of the lesion, 1 ml of injectate was delivered. It was then repeated for each facet joint nerve mentioned above. Appropriate radiographs were obtained to verify the probe placement during the neurotomy.   Additional Comments:  The patient tolerated the procedure well Dressing: 2 x 2 sterile gauze and Band-Aid    Post-procedure details: Patient was observed during the procedure. Post-procedure instructions were reviewed.  Patient left the clinic in stable condition.

## 2022-01-21 DIAGNOSIS — Z8639 Personal history of other endocrine, nutritional and metabolic disease: Secondary | ICD-10-CM | POA: Diagnosis not present

## 2022-01-21 DIAGNOSIS — Z8679 Personal history of other diseases of the circulatory system: Secondary | ICD-10-CM | POA: Diagnosis not present

## 2022-01-21 DIAGNOSIS — E663 Overweight: Secondary | ICD-10-CM | POA: Diagnosis not present

## 2022-01-21 DIAGNOSIS — Z9884 Bariatric surgery status: Secondary | ICD-10-CM | POA: Diagnosis not present

## 2022-01-21 DIAGNOSIS — R5383 Other fatigue: Secondary | ICD-10-CM | POA: Diagnosis not present

## 2022-01-21 DIAGNOSIS — L853 Xerosis cutis: Secondary | ICD-10-CM | POA: Diagnosis not present

## 2022-01-21 DIAGNOSIS — G119 Hereditary ataxia, unspecified: Secondary | ICD-10-CM | POA: Diagnosis not present

## 2022-01-21 DIAGNOSIS — E109 Type 1 diabetes mellitus without complications: Secondary | ICD-10-CM | POA: Diagnosis not present

## 2022-01-26 DIAGNOSIS — E663 Overweight: Secondary | ICD-10-CM | POA: Diagnosis not present

## 2022-02-09 DIAGNOSIS — H25813 Combined forms of age-related cataract, bilateral: Secondary | ICD-10-CM | POA: Diagnosis not present

## 2022-02-09 DIAGNOSIS — H25811 Combined forms of age-related cataract, right eye: Secondary | ICD-10-CM | POA: Diagnosis not present

## 2022-02-11 ENCOUNTER — Ambulatory Visit (INDEPENDENT_AMBULATORY_CARE_PROVIDER_SITE_OTHER): Payer: Medicare Other | Admitting: Internal Medicine

## 2022-02-11 ENCOUNTER — Encounter: Payer: Self-pay | Admitting: Internal Medicine

## 2022-02-11 VITALS — BP 124/86 | HR 82 | Ht 70.0 in | Wt 174.0 lb

## 2022-02-11 DIAGNOSIS — E785 Hyperlipidemia, unspecified: Secondary | ICD-10-CM

## 2022-02-11 DIAGNOSIS — E1042 Type 1 diabetes mellitus with diabetic polyneuropathy: Secondary | ICD-10-CM

## 2022-02-11 DIAGNOSIS — E1065 Type 1 diabetes mellitus with hyperglycemia: Secondary | ICD-10-CM | POA: Diagnosis not present

## 2022-02-11 LAB — POCT GLYCOSYLATED HEMOGLOBIN (HGB A1C): Hemoglobin A1C: 6.8 % — AB (ref 4.0–5.6)

## 2022-02-11 NOTE — Patient Instructions (Addendum)
-   Decrease  Tresiba 14 units daily ( But increase to 15 units on the day before and the day of taking steroid injections) - Continue Novolog 8 units with Breakfast , 10 units with Lunch and 7 units supper Novolog correctional insulin: ADD extra units on insulin to your meal-time Novolog dose if your blood sugars are higher than 195. Use the scale below to help guide you:   Blood sugar before meal Number of units to inject  Less than 195 0 unit  196 - 260 1 units  261 - 325 2 units  326 - 390 3 units  391 - 455 4 units     HOW TO TREAT LOW BLOOD SUGARS (Blood sugar LESS THAN 70 MG/DL) Please follow the RULE OF 15 for the treatment of hypoglycemia treatment (when your (blood sugars are less than 70 mg/dL)   STEP 1: Take 15 grams of carbohydrates when your blood sugar is low, which includes:  3-4 GLUCOSE TABS  OR 3-4 OZ OF JUICE OR REGULAR SODA OR ONE TUBE OF GLUCOSE GEL    STEP 2: RECHECK blood sugar in 15 MINUTES STEP 3: If your blood sugar is still low at the 15 minute recheck --> then, go back to STEP 1 and treat AGAIN with another 15 grams of carbohydrates.

## 2022-02-11 NOTE — Progress Notes (Signed)
Name: Joyce Brown  Age/ Sex: 71 y.o., female   MRN/ DOB: [id number], 1950-03-30     PCP: Lind Covert, MD   Reason for Endocrinology Evaluation: Type 1 Diabetes Mellitus  Initial Endocrine Consultative Visit: 01/17/2018    PATIENT IDENTIFIER: Joyce Brown is a 71 y.o. female with a past medical history of T1DM, cerebellar ataxia , asthma, OSA on CPAP and Hx of gastric bypass in 2014  . The patient has followed with Endocrinology clinic since 01/17/18 for consultative assistance with management of her diabetes.  Patient moved from [place] in September, 2019 to be close to her sister. She has been diagnosed with stiff person Syndrome but Dr. Jannifer Franklin thought this may be more of cerebellar atrophy. She was on  IVIG until 11/07/2018. 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 She has only had one DKA episode which was in 11/2017. Marland Kitchen Her hemoglobin A1c has ranged from 8.4% in 01/2017, peaking at 12.1 % in 01/2016  She is followed by Dr. 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 She has been off IVIG since 02/2018. He has been tapering her diazepam down and is currently on cellcept.    Started using the inpen 07/2018    She had a history of right fifth metatarsal fracture in July 2022  SUBJECTIVE:   During the last visit (01/21/2022): A1c 7.9 %    Today (02/11/2022): Joyce Brown is here for a months follow up visit on diabetes.  She checks her blood sugars multiple times daily, using  CGM (Dexcom). The patient has had hypoglycemic episodes since the last clinic visit. The patient is symptomatic with these episodes.  These typically occur after lunch and at times after breakfast  She was seen by bariatric surgeon on 01/21/2022 She continues to follow-up with physical medicine, received  trigger point injections 01/18/2022 for myofascial pain She was seen by Ortho 01/06/2022 for back pain-received methylprednisolone injection She was seen by ENT 12/09/2021 for hearing loss, tinnitus, and cerumen impaction  She was fitted for diabetic shoes and insoles on 09/07/2021  Denies nausea, vomiting or diarrhea  She is scheduled for cataract right eye -12/20th, Left eye 1/3rd   HOME DIABETES REGIMEN:  Tresiba 15 units QHS Novolog 8 units  With Breakfast, 10 units with lunch and 7 units with supper  CF (BG - 110/65)    CONTINUOUS GLUCOSE MONITORING RECORD INTERPRETATION    Dates of Recording: 11/17-11/30/2023 Sensor description:Dexcom  Results statistics:   CGM use % of time 93  Average and SD 149/69  Time in range 72 %  % Time Above 180 16  % Time above 250 9  % Time Below target <2    Glycemic patterns summary: BG's optimal overnight and fluctuate during the day   Hyperglycemic episodes postprandial  Hypoglycemic episodes occurred following a bolus  Overnight periods: trends down   IN-Pen report 7/1-7/14/2023  Average 135 Missed doses: 6 SD 73 TDD 38.9 units   DIABETIC COMPLICATIONS: Microvascular complications:  Neuropathy, left eye retinopathy Denies: retinopathy, CKD Last eye exam: Completed 05/29/2021   Macrovascular complications:    Denies: CAD, PVD, CVA         HISTORY:  Past Medical History:  Past Medical History:  Diagnosis Date   Cerebellar ataxia ([place])    Diabetes mellitus without complication ([place])  Diabetic neuropathy ([place]) 12/09/2020   Foot fracture 2022   Gait abnormality 11/07/2018   Hyperlipidemia    Pulmonary embolism ([facility])    Sleep apnea    Spinal stenosis    Past Surgical History:  Past Surgical History:  Procedure Laterality Date   ABDOMINAL HYSTERECTOMY     ADENOIDECTOMY     ANKLE SURGERY Left    BILATERAL CARPAL TUNNEL RELEASE     BREAST BIOPSY Left 2019   benign   ELBOW SURGERY     GASTRIC BYPASS  2014    HAND NEUROPLASTY     radio denervation  05/21/2020   TONSILLECTOMY     Social History:  reports that she has never smoked. She has never been exposed to tobacco smoke. She has never used smokeless tobacco. She reports that she does not drink alcohol and does not use drugs. Family History:  Family History  Problem Relation Age of Onset   Cervical cancer Mother    Ovarian cancer Mother    Dementia Father    Retinal detachment Father    Heart attack Father    Diabetes Father    Stroke Father    Hypertension Father    Ovarian cancer Sister    Arthritis Sister    Arthritis Sister    Arthritis Sister    Glaucoma Paternal Grandfather      HOME MEDICATIONS: Allergies as of 02/11/2022   No Known Allergies      Medication List        Accurate as of February 11, 2022  8:31 AM. If you have any questions, ask your nurse or doctor.          STOP taking these medications    carboxymethylcellulose 0.5 % Soln Commonly known as: REFRESH PLUS Stopped by: Dorita Sciara, MD       TAKE these medications    Acetaminophen 500 MG capsule Take 500 mg by mouth every 6 (six) hours as needed for moderate pain.   antiseptic oral rinse Liqd 15 mLs by Mouth Rinse route in the morning, at noon, in the evening, and at bedtime.   atorvastatin 80 MG tablet Commonly known as: LIPITOR Take 1 tablet (80 mg total) by mouth daily.   Bariatric Fusion Chew Chew 2 tablets by mouth 2 (two) times daily.   BD Insulin Syringe U/F 1/2Unit 31G X 5/16" 0.3 ML Misc Generic drug: Insulin Syringe-Needle U-100 USE 3 TIMES A DAY   Dexcom G7 Receiver Devi Use as directed   Dexcom G7 Sensor Misc by Does not apply route.   Droplet Pen Needles 31G X 5 MM Misc Generic drug: Insulin Pen Needle USE 4 TIMES DAILY AS DIRECTED BY YOUR DOCTOR   estradiol 0.1 MG/GM vaginal cream Commonly known as: ESTRACE Place vaginally.   gabapentin 100 MG capsule Commonly known as: NEURONTIN TAKE ONE  CAPSULE BY MOUTH THREE TIMES A DAY   glucose blood test strip ASCENSIA CONTOUR NEXT ONE: Use 1 strip to check blood sugar daily   Gvoke HypoPen 1-Pack 1 MG/0.2ML Soaj Generic drug: Glucagon Inject 1 mg into the skin as directed.   insulin aspart cartridge Commonly known as: NOVOLOG Inject into the skin 3 (three) times daily with meals. Use with inpen What changed: Another medication with the same name was removed. Continue taking this medication, and follow the directions you see here. Changed by: Dorita Sciara, MD   Microlet Lancets Misc 3 (three) times daily.   mycophenolate 500 MG tablet Commonly known as:  CELLCEPT Take 2 tablets (1,000 mg total) by mouth 2 (two) times daily.   Myrbetriq 50 MG Tb24 tablet Generic drug: mirabegron ER Take 50 mg by mouth daily.   sertraline 50 MG tablet Commonly known as: ZOLOFT Take by mouth.   SYSTANE FREE OP Apply 1 drop to eye 3 (three) times daily.   tiZANidine 2 MG tablet Commonly known as: ZANAFLEX TAKE 1 TO 2 TABLETS BY MOUTH AT BEDTIME AS NEEDED FOR MUSCLE SPASMS   Tresiba FlexTouch 100 UNIT/ML FlexTouch Pen Generic drug: insulin degludec Inject 15 Units into the skin daily.   Vitamin D3 75 MCG (3000 UT) Tabs Take 75 mcg by mouth daily.        PHYSICAL EXAM: VS: BP 124/86 (BP Location: Left Arm, Patient Position: Sitting, Cuff Size: Large)   Pulse 82   Ht _0  (1.778 m)   Wt 174 lb (78.9 kg) Comment: patient reported  SpO2 98%   BMI 24.97 kg/m    EXAM: General: Pt appears well and is in NAD  Lungs: Clear with good BS bilat with no rales, rhonchi, or wheezes  Heart: Auscultation: RRR   Extremities:  BL LE: no pretibial edema   Mental Status: Judgment, insight: intact Mood and affect: no depression, anxiety, or agitation   DM Foot Exam 06/26/2021 The skin of the feet is intact without sores or ulcerations. The pedal pulses are 2+ on right and 2+ on left. The sensation is intact to a screening 5.07,  10 gram monofilament bilaterally     DATA REVIEWED:  Lab Results  Component Value Date   HGBA1C 6.8 (A) 02/11/2022   HGBA1C 7.9 (A) 09/28/2021   HGBA1C 6.1 (A) 06/26/2021    Latest Reference Range & Units 12/01/21 11:22  Sodium 134 - 144 mmol/L 138  Potassium 3.5 - 5.2 mmol/L 5.1  Chloride 96 - 106 mmol/L 99  CO2 20 - 29 mmol/L 23  Glucose 70 - 99 mg/dL 436 (H)  BUN 8 - 27 mg/dL 24  Creatinine 0.57 - 1.00 mg/dL 1.00  Calcium 8.7 - 10.3 mg/dL 10.0  BUN/Creatinine Ratio 12 - 28  24  eGFR >59 mL/min/1.73 61  Alkaline Phosphatase 44 - 121 IU/L 74  Albumin 3.9 - 4.9 g/dL 4.2  Albumin/Globulin Ratio 1.2 - 2.2  1.8  AST 0 - 40 IU/L 25  ALT 0 - 32 IU/L 29  Total Protein 6.0 - 8.5 g/dL 6.5  Total Bilirubin 0.0 - 1.2 mg/dL 0.4      Latest Reference Range & Units 06/26/21 10:39  Creatinine,U mg/dL 82.4  Microalb, Ur 0.0 - 1.9 mg/dL <0.7  MICROALB/CREAT RATIO 0.0 - 30.0 mg/g 0.8    ASSESSMENT / PLAN / RECOMMENDATIONS:   1) Type 1 Diabetes Mellitus, Optimally  Controlled - Most recent A1c of 6.8%. Goal A1c < 7.5 %.   -I have praised the patient improved glycemic control -Today we reviewed her Dexcom as well as InPen report -The patient has been noted with tight BG's overnight, will reduce her Tyler Aas as below, but she was advised to increase [place] on the days that she receives glucocorticoid injections in the day prior. -I would not change her prandial dose of insulin at this time, but she was encouraged to use the snack dosing of 1 unit with snacks or if she is going to drink lattes.  She has been noted with severe hyperglycemia yesterday after breakfast that she attributes to drinking ginger latte   MEDICATIONS: Decrease Tresiba 14 units daily  Continue Novolog 8 units with breakfast, 10 units with lunch  , and 7 units with  supper  Continue  Sensitivity factor 65 Novolog 1 unit with snack     EDUCATION / INSTRUCTIONS: BG monitoring instructions: Patient is  instructed to check her blood sugars 4 times a day with CGM caliberation. Call [facility] if: BG persistently < 70  I reviewed the Rule of 15 for the treatment of hypoglycemia in detail with the patient.     2) Diabetic complications:  Eye: Does not have known diabetic retinopathy.  Neuro/ Feet: Does not have known diabetic peripheral neuropathy. Renal: Patient does not have known baseline CKD. She is not on an ACEI/ARB at present.         F/U in 4 months   I spent 25 minutes preparing to see the patient by review of recent labs, imaging and procedures, obtaining and reviewing separately obtained history, communicating with the patient, ordering medications, and documenting clinical information in the EHR including the differential Dx, treatment, and any further evaluation and other management   Signed electronically by: Mack Guise, MD  [facility]  [facility] [address] [place], [place] [address] Phone: [phone] FAX: [phone]   CC: Lind Covert, [address] [place] [address] Phone: [phone]  Fax: [phone]  Return to Endocrinology clinic as below: Future Appointments  Date Time Provider [place]  03/26/2022  9:00 AM Marzetta Board, DPM [facility]  04/19/2022 10:00 AM Courtney Heys, MD [facility]  05/27/2022  9:00 AM Clydell Hakim, RD [place] [facility]  06/09/2022 10:45 AM Suzzanne Cloud, NP [facility] None

## 2022-02-12 ENCOUNTER — Encounter: Payer: Self-pay | Admitting: Internal Medicine

## 2022-02-13 ENCOUNTER — Encounter: Payer: Self-pay | Admitting: Family Medicine

## 2022-02-15 ENCOUNTER — Telehealth: Payer: Self-pay | Admitting: Dietician

## 2022-02-15 NOTE — Telephone Encounter (Signed)
Patient sent Dexcom reports and meal diaries since August via Fedex to my office to review.  Called patient to discuss with her.  She is not currently available.  Left message for her to call me to discuss.  Review of reports: Overall intake consistent compared to what she has told me on previous visits.  She eats simple food, little variety and overall very rare indiscretions noted.   The Dexcom Reports were the weekly summary. She did not document her physical activity, pain, or stress level.  Dexcom results overal have improved while she has done this activity.    Time in Range has improved from 69% in March 2023 to current.  Data 01/24/2022-02/06/2022  when documenting intake CGM Results from download:        Average glucose:   145 mg/dL for 14 days     Time in range (70-180 mg/dL):   93 %   (Goal >70%)  Time High (181-250 mg/dL):   1 %   (Goal < 25%)  Time Very High (>250 mg/dL):    4 %   (Goal < 5%)  Time Low (54-69 mg/dL):    <1 %   (Goal <4%)  Time Very Low (<54 mg/dL):   <1 %   (Goal <1%)       Data 02/02/2022-02/15/2022 CGM Results from download:      Average glucose:   153 mg/dL for 14 days     Time in range (70-180 mg/dL):   72 %   (Goal >70%)  Time High (181-250 mg/dL):   16 %   (Goal < 25%)  Time Very High (>250 mg/dL):    9 %   (Goal < 5%)  Time Low (54-69 mg/dL):   2 %   (Goal <4%)  Time Very Low (<54 mg/dL):   1 %   (Goal <1%)      She did see her endocrinologist 02/11/2022 and her Tyler Aas was decreased.  [place] is over treating hypoglycemia. She is counting carbohydrate.  Carbohydrate intake is inconsistent and greatest at lunch (she receives a greater amount of Novolog for lunch - 10 units).  Will wait to hear from patient.   She can call for questions (or My Chart) at any time.  Antonieta Iba, RD, LDN, CDCES

## 2022-02-16 ENCOUNTER — Telehealth: Payer: Self-pay | Admitting: Dietician

## 2022-02-16 NOTE — Telephone Encounter (Signed)
Called patient regarding the Dexcom and food record results. We discussed that the process of journaling her intake, reading more food labels and observing her Dexcom results have improved her glucose control.  Discussed that these are valuable tasks to continue to maintain best glucose control.  She is also documenting her protein intake which helps better balance meals. She has maintained increased activity while organizing her apartment. Discussed that MD suggested an I:C ratio of 1:15 but this will amount to less insulin than she is currently taking for meals.  Will continue plan per previous MD visits at this time.  Instructed her to keep a food journal for 2 weeks prior to her next RD appointment and include notes such as activity/exercise, stress, pain, illness and I will compare this to her Dexcom report in the office.  Discussed for her to continue journaling as desired as this improved her mindfulness and therefore blood glucose control.  Patient to call for further questions and f/u with MD and myself in 2024.  Antonieta Iba, RD, LDN, CDCES

## 2022-02-27 ENCOUNTER — Other Ambulatory Visit: Payer: Self-pay | Admitting: Family Medicine

## 2022-02-27 DIAGNOSIS — E1042 Type 1 diabetes mellitus with diabetic polyneuropathy: Secondary | ICD-10-CM

## 2022-03-03 DIAGNOSIS — H25811 Combined forms of age-related cataract, right eye: Secondary | ICD-10-CM | POA: Diagnosis not present

## 2022-03-04 ENCOUNTER — Encounter: Payer: Self-pay | Admitting: Family Medicine

## 2022-03-04 ENCOUNTER — Encounter: Payer: Self-pay | Admitting: Internal Medicine

## 2022-03-16 DIAGNOSIS — H25812 Combined forms of age-related cataract, left eye: Secondary | ICD-10-CM | POA: Diagnosis not present

## 2022-03-17 DIAGNOSIS — H25812 Combined forms of age-related cataract, left eye: Secondary | ICD-10-CM | POA: Diagnosis not present

## 2022-03-17 DIAGNOSIS — H268 Other specified cataract: Secondary | ICD-10-CM | POA: Diagnosis not present

## 2022-03-20 ENCOUNTER — Other Ambulatory Visit: Payer: Self-pay | Admitting: Internal Medicine

## 2022-03-20 DIAGNOSIS — E1042 Type 1 diabetes mellitus with diabetic polyneuropathy: Secondary | ICD-10-CM

## 2022-03-26 ENCOUNTER — Ambulatory Visit (INDEPENDENT_AMBULATORY_CARE_PROVIDER_SITE_OTHER): Payer: Medicare Other | Admitting: Podiatry

## 2022-03-26 ENCOUNTER — Encounter: Payer: Self-pay | Admitting: Podiatry

## 2022-03-26 VITALS — BP 138/73

## 2022-03-26 DIAGNOSIS — M79675 Pain in left toe(s): Secondary | ICD-10-CM | POA: Diagnosis not present

## 2022-03-26 DIAGNOSIS — M79674 Pain in right toe(s): Secondary | ICD-10-CM

## 2022-03-26 DIAGNOSIS — B351 Tinea unguium: Secondary | ICD-10-CM

## 2022-03-26 DIAGNOSIS — E1042 Type 1 diabetes mellitus with diabetic polyneuropathy: Secondary | ICD-10-CM

## 2022-03-26 NOTE — Progress Notes (Unsigned)
  Subjective:  Patient ID: Joyce Brown, female    DOB: 1951-01-30,  MRN: [id number]  Joyce Brown presents to clinic today for {jgcomplaint:23593}  Chief Complaint  Patient presents with   Nail Problem    [facility] BS-119 A1C-6.8 PCP-[facility] PCP VST-08/2021   New problem(s): None. {jgcomplaint:23593}  PCP is Lind Covert, MD.  No Known Allergies  Review of Systems: Negative except as noted in the HPI.  Objective: No changes noted in today's physical examination. Vitals:   03/26/22 0906  BP: 138/73   Joyce Brown is a pleasant 72 y.o. female {jgbodyhabitus:24098} AAO x 3.  Vascular CFT immediate b/l LE. Palpable DP/PT pulses b/l LE. Digital hair present b/l. Skin temperature gradient WNL b/l. No pain with calf compression b/l. No edema noted b/l. No cyanosis or clubbing noted b/l LE.  Neurologic Normal speech. Oriented to person, place, and time. Protective sensation intact 5/5 intact bilaterally with 10g monofilament b/l. Vibratory sensation intact b/l.  Dermatologic Pedal skin warm and supple b/l.  No open wounds b/l. No interdigital macerations.   Toenails 1-5 b/l elongated, thickened, discolored with subungual debris. +Tenderness with dorsal palpation of nailplates. No hyperkeratotic nor porokeratotic lesions noted b/l.  Orthopedic: Muscle strength 5/5 to all lower extremity muscle groups bilaterally. No pain, crepitus or joint limitation noted with ROM bilateral LE. Hammertoe deformity noted 2-5 b/l. Utilizes motorized chair for mobility assistance.   Radiographs: None  Assessment/Plan: No diagnosis found.  No orders of the defined types were placed in this encounter.   None {Jgplan:23602::"-Patient/POA to call should there be question/concern in the interim."}   Return in about 3 months (around 06/25/2022).  Marzetta Board, DPM

## 2022-04-19 ENCOUNTER — Encounter: Payer: Self-pay | Admitting: Physical Medicine and Rehabilitation

## 2022-04-19 ENCOUNTER — Encounter
Payer: Medicare Other | Attending: Physical Medicine and Rehabilitation | Admitting: Physical Medicine and Rehabilitation

## 2022-04-19 VITALS — BP 97/61 | HR 79 | Ht 70.0 in | Wt 174.0 lb

## 2022-04-19 DIAGNOSIS — M797 Fibromyalgia: Secondary | ICD-10-CM | POA: Insufficient documentation

## 2022-04-19 DIAGNOSIS — M7918 Myalgia, other site: Secondary | ICD-10-CM | POA: Diagnosis not present

## 2022-04-19 DIAGNOSIS — G119 Hereditary ataxia, unspecified: Secondary | ICD-10-CM | POA: Insufficient documentation

## 2022-04-19 MED ORDER — LIDOCAINE HCL 1 % IJ SOLN
6.0000 mL | Freq: Once | INTRAMUSCULAR | Status: AC
  Administered 2022-04-19: 3 mL

## 2022-04-19 NOTE — Progress Notes (Signed)
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 Has Cerebellar atrophy with ataxia and also chronic low back pain s/p radiofrequency ablation 3/22; Here for f/u on Cerebellar ataxia and trigger point injections. Has has Myofascial pain and fibromyalgia.  Here for f/u on cerebellar ataxia and trigger point injections.    Hasn't had any falls No near falls!!!  Had Cataract surgery for B/L eyes- 12/23 and 1/24 for R and L eyes.   Ready for trigger point injections.   Last hand trigger point injections real helpful- also using Voltaren gel- which is also helpful.    Discussing new w/c.  Wants new w/c to have: Transport brackets Extended brake handles Quickie 2 Ulice Dash Fushion  [employer] and [employer].    Plan:- discussed w/c options and using her w/c to donate.  When it's time for w/c- 07/26/22- want to evaluate for possible power assist WHEELS. Want to add brake extenders, folding Milus Banister ; and [place] fusion w/c cushion.  Pt concerned about power assist because "no bar across back" for power assist, however also have power assist wheels- so will see.    3.  Look up [employer] and [employer] and decide which company want to use.    4. Patient here for trigger point injections for myofascial pain.  Consent done and on chart.  Cleaned areas with alcohol and injected using a 27 gauge 1.5 inch needle  Injected 2.5cc- wasted 0.5 cc Using 1% Lidocaine with no EPI  Upper traps Levators Posterior scalenes Middle scalenes Splenius Capitus B/L  Pectoralis Major Rhomboids Infraspinatus Teres Major/minor Thoracic paraspinals Lumbar paraspinals B/L  Other injections-  R only first dorsal interossei and R thenar eminence.    Patient's level of pain prior was  4/10 Current level of pain after injections is- still around a 4/10- usually takes longer to improve.   There was no bleeding or  complications.  Patient was advised to drink a lot of water on day after injections to flush system Will have increased soreness for 12-48 hours after injections.  Can use Lidocaine patches the day AFTER injections Can use theracane on day of injections in places didn't inject Can use heating pad 4-6 hours AFTER injections  5. F/U in 43month- - let me know by that appointment, [employer] or [employer]-    I spent a total of  26  minutes on total care today- >50% coordination of care- due to 10 minutes on injections and 16 minutes discussing w/c.

## 2022-04-19 NOTE — Patient Instructions (Signed)
Plan:- discussed w/c options and using her w/c to donate.  When it's time for w/c- 07/26/22- want to evaluate for possible power assist WHEELS. Want to add brake extenders, folding Milus Banister ; and [place] fusion w/c cushion.  Pt concerned about power assist because "no bar across back" for power assist, however also have power assist wheels- so will see.    3.  Look up [place] and [employer] and decide which company want to use.    4. Patient here for trigger point injections for myofascial pain.  Consent done and on chart.  Cleaned areas with alcohol and injected using a 27 gauge 1.5 inch needle  Injected  Using 1% Lidocaine with no EPI  Upper traps Levators Posterior scalenes Middle scalenes Splenius Capitus B/L  Pectoralis Major Rhomboids Infraspinatus Teres Major/minor Thoracic paraspinals Lumbar paraspinals B/L  Other injections-  R only first dorsal interossei and R thenar eminence.    Patient's level of pain prior was  4/10 Current level of pain after injections is- still around a 4/10- usually takes longer to improve.   There was no bleeding or complications.  Patient was advised to drink a lot of water on day after injections to flush system Will have increased soreness for 12-48 hours after injections.  Can use Lidocaine patches the day AFTER injections Can use theracane on day of injections in places didn't inject Can use heating pad 4-6 hours AFTER injections  5. F/U in 24month- - let me know by that appointment, [employer] or [employer]-

## 2022-04-26 ENCOUNTER — Encounter: Payer: Self-pay | Admitting: Internal Medicine

## 2022-04-26 MED ORDER — LANTUS SOLOSTAR 100 UNIT/ML ~~LOC~~ SOPN: 14.0000 [IU] | PEN_INJECTOR | Freq: Every day | SUBCUTANEOUS | 11 refills | Status: DC

## 2022-05-11 ENCOUNTER — Encounter: Payer: Self-pay | Admitting: Physical Medicine and Rehabilitation

## 2022-05-15 ENCOUNTER — Other Ambulatory Visit: Payer: Self-pay | Admitting: Internal Medicine

## 2022-05-26 ENCOUNTER — Encounter: Payer: Self-pay | Admitting: Family Medicine

## 2022-05-26 ENCOUNTER — Other Ambulatory Visit: Payer: Self-pay | Admitting: Family Medicine

## 2022-05-26 DIAGNOSIS — Z23 Encounter for immunization: Secondary | ICD-10-CM | POA: Diagnosis not present

## 2022-05-27 ENCOUNTER — Other Ambulatory Visit: Payer: Self-pay | Admitting: Family Medicine

## 2022-05-27 ENCOUNTER — Encounter: Payer: Self-pay | Admitting: Dietician

## 2022-05-27 ENCOUNTER — Encounter: Payer: Medicare Other | Attending: Internal Medicine | Admitting: Dietician

## 2022-05-27 DIAGNOSIS — E1065 Type 1 diabetes mellitus with hyperglycemia: Secondary | ICD-10-CM | POA: Diagnosis not present

## 2022-05-27 DIAGNOSIS — Z713 Dietary counseling and surveillance: Secondary | ICD-10-CM | POA: Insufficient documentation

## 2022-05-27 DIAGNOSIS — E1042 Type 1 diabetes mellitus with diabetic polyneuropathy: Secondary | ICD-10-CM

## 2022-05-27 NOTE — Progress Notes (Signed)
Diabetes Self-Management Education  Visit Type: Follow-up  Appt. Start Time: 0900 Appt. End Time: 1000  05/27/2022  Joyce Brown, identified by name and date of birth, is a 72 y.o. female with a diagnosis of Diabetes:  .   ASSESSMENT Patient is here today alone.  She was last seen by this RD on 11/26/2021, as well as fully evaluated sensor readings and food log 02/2022.  Patient has been keeping very detailed food records which include the number of carbohydrate grams and grams of protein eaten at each meal.  This was compared to her Dexcom report as well as the amount of insulin that she bolused for the meal.  Intake of carbohydrate per meal frequently is consistent but varies widely at times resulting in low blood glucose or high readings to 350.  She is not always consistent with carbohydrate.   She is currently on a Fixed Dose Mode on her inPen.  (8 units before breakfast, 10 units before lunch, and 7 units before dinner) as well as receiving a correction dose. I:C per evaluation appears to be 1:6 with resulting high blood glucose post meals.  Low blood glucose when she eats less than 30 grams of carbs I:C 1:2.  Appears that she over treats lows at times.  She is currently not exercising as much as the gym is being refurbished.  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   History includes:Type 1 Diabetes since 2007, asthma, cerebellar ataxia, HTN, HLD, OSA on C-pap and sleep study Julyl 6.  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 Dexcom G-6 since 07/11/18.  Inpen  Medication includes Tresiba 14 units q HS and Novolog 6 units before breakfast,10 units before lunch and 8 units before dinner and 1 unit before a snack.  plus sliding scale (insulin sensitivity factor:  110/65).   She is not aiming to give this 30 minutes prior to the meal which has improved her blood sugar  control. A1C 6.8% 02/11/2022 decreased from 7.9% 09/28/2021   CGM Results from download: 01/29/22 05/27/2022  % Time CGM active:   93 %   (Goal >70%) 93  Average glucose:   14 mg/dL for 14 days 184 mg/dL for 14 days  Glucose management indicator:   unknown 7.7%  Time in range (70-180 mg/dL):   72 %   (Goal >70%) 52%  Time High (181-250 mg/dL):   16 %   (Goal < 25%) 28%  Time Very High (>250 mg/dL):    9 %   (Goal < 5%) 17%  Time Low (54-69 mg/dL):   <2 %   (Goal <4%) 1%  Time Very Low (<54 mg/dL):    %   (Goal <1%) 2%   Weight hx: 183 lbs 09/28/2021 194 lbs 05/2021 per patient 185 lbs per patient 11/21/2020 173 lbs 05/15/2020 178 lbs 02/18/2020   Patient lives alone.  She uses a wheel chair.  She is doing her own grocery shopping.  She uses [employer] [facility] for medical appointments but is able to wheel herself to the grocery store.  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 She moved here from [place] last year to be closer to one of her sister's who lives in [place]. The pool at her apartment is outdoors.  Patient enjoys walking in the pool.  Uses the gym at her apartment complex when available. She last saw an RD  in [place] about 11 months prior to my first visit.   Diabetes Self-Management Education - 05/27/22 1832       Visit Information   Visit Type Follow-up      Psychosocial Assessment   Self-care barriers Debilitated state due to current medical condition    Self-management support Doctor's office;CDE visits    Other persons present Patient    Patient Concerns Nutrition/Meal planning    Special Needs None    Preferred Learning Style No preference indicated    Learning Readiness Ready      Pre-Education Assessment   Patient understands the diabetes disease and treatment process. Demonstrates understanding / competency    Patient understands incorporating nutritional management into lifestyle. Needs Review    Patient  undertands incorporating physical activity into lifestyle. Demonstrates understanding / competency    Patient understands using medications safely. Demonstrates understanding / competency    Patient understands monitoring blood glucose, interpreting and using results Demonstrates understanding / competency    Patient understands prevention, detection, and treatment of acute complications. Demonstrates understanding / competency    Patient understands prevention, detection, and treatment of chronic complications. Demonstrates understanding / competency    Patient understands how to develop strategies to address psychosocial issues. Demonstrates understanding / competency    Patient understands how to develop strategies to promote health/change behavior. Comprehends key points      Complications   Last HgB A1C per patient/outside source 6.8 %   11/23 decreased from 7.9% 09/28/2021   How often do you check your blood sugar? > 4 times/day    Fasting Blood glucose range (mg/dL) 130-179;70-129   110-160   Postprandial Blood glucose range (mg/dL) 130-179;180-200;>200   up to 400 at times   Number of hypoglycemic episodes per month 3      Dietary Intake   Breakfast [place], [place], [place], and egg white sandwich, pistachio latte    Lunch tuna salad, baby spinach, carrot, graham crackers    Dinner chicken corn chowder, wasa light rye, 1 whole carrot    Beverage(s) water, club soda, latte      Activity / Exercise   Activity / Exercise Type Light (walking / raking leaves)   rolls wheel chair around parking lot     Patient Education   Previous Diabetes Education Yes (please comment)   12/23   Healthy Eating Meal options for control of blood glucose level and chronic complications.;Meal timing in regards to the patients' current diabetes medication.;Carbohydrate counting    Being Active Role of exercise on diabetes management, blood pressure control and cardiac health.    Medications Reviewed patients  medication for diabetes, action, purpose, timing of dose and side effects.;Other (comment)    Monitoring Taught/evaluated CGM (comment)    Acute complications Taught prevention, symptoms, and  treatment of hypoglycemia - the 15 rule.      Individualized Goals (developed by patient)   Nutrition Carb counting;Follow meal plan discussed    Physical Activity Exercise 3-5 times per week;30 minutes per day    Medications take my medication as prescribed    Monitoring  Consistenly use CGM    Problem Solving Eating Pattern    Reducing Risk examine blood glucose patterns      Patient Self-Evaluation of Goals - Patient rates self as meeting previously set goals (% of time)   Nutrition >75% (most of the time)    Physical Activity 25 - 50% (sometimes)    Medications >75% (most of the time)    Monitoring >  75% (most of the time)    Problem Solving and behavior change strategies  >75% (most of the time)    Reducing Risk (treating acute and chronic complications) [id number] (most of the time)    Health Coping >75% (most of the time)      Post-Education Assessment   Patient understands the diabetes disease and treatment process. Demonstrates understanding / competency    Patient understands incorporating nutritional management into lifestyle. Comprehends key points    Patient undertands incorporating physical activity into lifestyle. Demonstrates understanding / competency    Patient understands using medications safely. Demonstrates understanding / competency    Patient understands monitoring blood glucose, interpreting and using results Demonstrates understanding / competency    Patient understands prevention, detection, and treatment of acute complications. Demonstrates understanding / competency    Patient understands prevention, detection, and treatment of chronic complications. Demonstrates understanding / competency    Patient understands how to develop strategies to address psychosocial issues.  Demonstrates understanding / competency    Patient understands how to develop strategies to promote health/change behavior. Comprehends key points      Outcomes   Expected Outcomes Demonstrated interest in learning. Expect positive outcomes    Future DMSE 3-4 months    Program Status Not Completed             Individualized Plan for Diabetes Self-Management Training:   Learning Objective:  Patient will have a greater understanding of diabetes self-management. Patient education plan is to attend individual and/or group sessions per assessed needs and concerns.   Plan:   Patient Instructions  When treating a low: Treat with 15 grams of fast acting carbs (such as 4 glucose tabs or 1/2 cup juice or regular soda) Recheck in 15 minutes.  If still <70, retreat and if >70 have a snack or meal with carbohydrates and protein.  Consider entering your BG into your inPen app 2 hours after you eat.  If you are to continue in the "Fixed Dose Mode" you will need to be more consistent with your carbohydrate.  Aim for 35-45 g carbs and 20 g protein per meal  Egg bites, cottage cheese, and tuna are all great protein choice.     Expected Outcomes:  Demonstrated interest in learning. Expect positive outcomes  Education material provided:   If problems or questions, patient to contact team via:  Phone  Future DSME appointment: 3-4 months

## 2022-05-27 NOTE — Patient Instructions (Addendum)
When treating a low: Treat with 15 grams of fast acting carbs (such as 4 glucose tabs or 1/2 cup juice or regular soda) Recheck in 15 minutes.  If still <70, retreat and if >70 have a snack or meal with carbohydrates and protein.  Consider entering your BG into your inPen app 2 hours after you eat.  If you are to continue in the "Fixed Dose Mode" you will need to be more consistent with your carbohydrate.  Aim for 35-45 g carbs and 20 g protein per meal  Egg bites, cottage cheese, and tuna are all great protein choice.

## 2022-06-03 ENCOUNTER — Telehealth: Payer: Self-pay | Admitting: Dietician

## 2022-06-03 NOTE — Telephone Encounter (Signed)
Called patient and informed her about the change to insulin to carb ratio recommended by Cloyd Stagers, MD - 1:6.  Patient has attempted to program this into the inPen app alone and with my help but each time, the app states that this needs to be approved.  She is going to reach out to the tech support line at [employer] and call if she has any questions.  Antonieta Iba, RD, LDN, CDCES

## 2022-06-08 ENCOUNTER — Encounter: Payer: Self-pay | Admitting: Internal Medicine

## 2022-06-08 ENCOUNTER — Other Ambulatory Visit: Payer: Self-pay | Admitting: Family Medicine

## 2022-06-08 NOTE — Progress Notes (Unsigned)
No chief complaint on file.  ASSESSMENT AND PLAN  Joyce Brown is a 72 y.o. 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 Degenerative endplate disease with adjacent marrow edema at C6-7 levels. - Mild multilevel degenerative spondylosis, disc bulging from C3-4 down to C6-7.  - No intrinsic or compressive spinal cord lesions.  DIAGNOSTIC DATA (LABS, IMAGING, TESTING) - I reviewed patient records, labs, notes, testing and imaging myself where available.  In 2023, A1C 6.3, ENA RNP antibody 1.8, normal CMP, creat 0.8, CBC, angiotensin-converting enzyme, protein electrophoresis, Lyme titer, B12, TSH,  06/09/21 MRI brain (without) demonstrating: -Few scattered periventricular and subcortical foci of nonspecific T2 hyperintensities. -No acute findings.   MRI of brain showed significant atrophy of cerebellum; mild supratentorium small vessel disease.  There is no acute abnormality.  06/09/21 MRI cervical spine (without) demonstrating: - At C6-7 disc bulging and facet hypertrophy with mild bilateral foraminal stenosis. Degenerative endplate disease with adjacent marrow edema at C6-7 levels. - Mild multilevel degenerative spondylosis, disc bulging from C3-4 down to C6-7.  - No intrinsic or compressive spinal cord  lesions.  MEDICAL HISTORY:  Joyce Brown is a 72 year old female, previous patient of Dr. Jannifer [place], for evaluation of ataxia, her primary care physician is Dr. Erin Hearing, Jeb Levering, MD   I reviewed and summarized the referring note.  Past medical history  Hyperlipidemia Diabetes Depression  Patient was seen by Dr. Jannifer [place] since October 2019, after she moved from [place] to [place] to be close to her sister.  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, she was treated with IVIG since 2007, was receiving 1 g/kg every 2 weeks, along with CellCept 500 mg 2 tablets twice a day  Her symptoms has been plateaued since 2007, there was no significant up-and-down since then, she now has mild dysarthria, but no dysphagia, rely on her wheelchair, walking with her leg sitting in wheelchair, mild disc Machi of upper extremity, denies significant vision problem  She denied family history of similar issues,  She also have severe incontinence, urinary urgency, incontinence, taking oxybutynin 5 mg 1 and half tablets twice a day, has extreme dry mouth, seeing urologist regularly  She lives alone in an apartment with 2 cats, by Kristopher Oppenheim, she use her wheelchair left walking to the [employer] for grocery shopping, able to cook simple microwave meal, she denies memory loss, she had severe low back  pain, much improved with radiofrequency treatment by Dr. Ernestina Patches  Since she was seen by Dr. Jannifer [place] in 2019, her 2 weekly IVIG was stopped,  maintained on CellCept 500 mg 2 tablets twice a day, she did not notice any significant difference, patient initially was also on diazepam, but she denies significant muscle stiffness,  Laboratory evaluations in 2022 showed normal CBC, CMP normal creatinine 0.79 with exception of mild elevated glucose 142, negative Lyme titer, protein electrophoresis, angiotensin-converting enzyme, positive ANA, low titer ENA RNP antibody, normal B12,  She has insulin-dependent diabetes, previously was not under good control A1c up to 9.7 in October 2020, now has much improved A1c was 6.15 December 2020. Personally reviewed MRI lumbar Jan 2022 Multilevel spondylosis. Lumbar dextrocurvature and slight reversal of lordosis.   Severe left L1-2 and moderate right L4-5 neural foraminal narrowing.   Mild spinal canal narrowing at the L1-3, L4-5 levels.   Mild left L2-3, bilateral L3-4, left L4-5 and bilateral L5-S1 neural foraminal narrowing.  CT head in September 2019, no acute abnormality, evidence of cerebellar atrophy  Update December 01, 2021 SS: Here today alone, excellent benefit with neuro rehab, 1 day a week gym, 1 day in the pool.  Finished 10/28/2021, more confident using the pool at her apartment, able to use just the handrail to walk-in.  Continues with stretching and strengthening, using a walker to walk about 10 feet 3 times a week.  In the last 6 months 1 fall to the side when lost balance, no injuries.  Is very pleased.   Going to pelvic floor PT, helping with incontinence. Still on Cellcept 1000 mg BID. 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   Update June 09, 2022 SS:   PHYSICAL EXAM:   There were no vitals filed for this visit.   Not recorded     There is no height or weight on file to calculate BMI.  PHYSICAL EXAMNIATION:  Gen: NAD, conversant, well nourised, well groomer, very pleasant  NEUROLOGICAL EXAM:  MENTAL STATUS: Speech: Speech was mostly clear, no  slurred noted Cognition:     Orientation to time, place and person     Normal recent and remote memory     Normal Attention span and concentration     Normal Language, naming, repeating,spontaneous speech     Fund of knowledge   CRANIAL NERVES: CN II: Visual fields are full to confrontation. Pupils are round equal and briskly reactive to light. CN III, IV, VI: extraocular movement are normal. No ptosis. CN V: Facial sensation is intact to light touch CN VII: Face is symmetric with normal eye closure  CN VIII: Hearing is normal to causal conversation. CN IX, X: Phonation is normal. CN XI: Head turning and shoulder shrug are intact  MOTOR: No muscle weakness was noted.  REFLEXES: Reflexes are 2+  SENSORY: Intact to soft touch  COORDINATION: Mild finger-to-nose dysmetria with the left, severe heel-to-shin dysmetria.  GAIT/STANCE: Able to stand with push off, once standing needs examiner assistance for balance, was wobbly  REVIEW OF SYSTEMS:  Full 14 system review of systems performed and notable only for as above All other review of systems were negative. ALLERGIES: No Known Allergies  HOME MEDICATIONS: Current Outpatient Medications  Medication Sig Dispense Refill   Acetaminophen 500 MG capsule Take 500 mg by mouth every 6 (six) hours as needed for moderate pain.     antiseptic oral rinse (BIOTENE) LIQD 15 mLs by Mouth Rinse route in the morning, at  noon, in the evening, and at bedtime.     atorvastatin (LIPITOR) 80 MG tablet TAKE ONE TABLET BY MOUTH DAILY 90 tablet 3   BD INSULIN SYRINGE U/F 1/2UNIT 31G X 5/16" 0.3 ML MISC USE 3 TIMES A DAY 100 each 3   Continuous Blood Gluc Sensor (DEXCOM G7 SENSOR) MISC by Does not apply route.     DROPLET PEN NEEDLES 31G X 5 MM MISC USE 4 TIMES DAILY AS DIRECTED BY YOUR DOCTOR 200 each 3   gabapentin (NEURONTIN) 100 MG capsule TAKE ONE CAPSULE BY MOUTH THREE TIMES A DAY 180 capsule 1   Glucagon (GVOKE HYPOPEN 1-PACK) 1 MG/0.2ML SOAJ  Inject 1 mg into the skin as directed. (Patient not taking: Reported on 04/19/2022) 0.2 mL 1   glucose blood test strip ASCENSIA CONTOUR NEXT ONE: Use 1 strip to check blood sugar daily 100 each 2   insulin aspart (NOVOLOG PENFILL) cartridge USE MAX OF DAILY DOSE OF 60 UNITS PER CORRECTION SCALE AS DIRECTED 60 mL 2   insulin glargine (LANTUS SOLOSTAR) 100 UNIT/ML Solostar Pen Inject 14 Units into the skin daily. 15 mL 11   Microlet Lancets MISC 3 (three) times daily.     Multiple Vitamins-Minerals (BARIATRIC FUSION) CHEW Chew 2 tablets by mouth 2 (two) times daily.     mycophenolate (CELLCEPT) 500 MG tablet Take 2 tablets (1,000 mg total) by mouth 2 (two) times daily. 360 tablet 3   MYRBETRIQ 50 MG TB24 tablet Take 50 mg by mouth daily.     Polyethyl Glycol-Propyl Glycol (SYSTANE FREE OP) Apply 1 drop to eye 3 (three) times daily.     sertraline (ZOLOFT) 100 MG tablet TAKE ONE TABLET BY MOUTH DAILY 90 tablet 2   tiZANidine (ZANAFLEX) 2 MG tablet TAKE 1 TO 2 TABLETS BY MOUTH AT BEDTIME AS NEEDED FOR MUSCLE SPASMS 360 tablet 2   No current facility-administered medications for this visit.    PAST MEDICAL HISTORY: Past Medical History:  Diagnosis Date   Cerebellar ataxia ([facility])    Diabetes mellitus without complication ([facility])    Diabetic neuropathy ([facility]) 12/09/2020   Foot fracture 2022   Gait abnormality 11/07/2018   Hyperlipidemia    Pulmonary embolism ([facility])    Sleep apnea    Spinal stenosis     PAST SURGICAL HISTORY: Past Surgical History:  Procedure Laterality Date   ABDOMINAL HYSTERECTOMY     ADENOIDECTOMY     ANKLE SURGERY Left    BILATERAL CARPAL TUNNEL RELEASE     BREAST BIOPSY Left 2019   benign   ELBOW SURGERY     GASTRIC BYPASS  2014   HAND NEUROPLASTY     radio denervation  05/21/2020   TONSILLECTOMY      FAMILY HISTORY: Family History  Problem Relation Age of Onset   Cervical cancer Mother    Ovarian cancer Mother    Dementia Father    Retinal detachment Father     Heart attack Father    Diabetes Father    Stroke Father    Hypertension Father    Ovarian cancer Sister    Arthritis Sister    Arthritis Sister    Arthritis Sister    Glaucoma Paternal Grandfather     SOCIAL HISTORY: Social History   Socioeconomic History   Marital status: Single    Spouse name: Not on file   Number of children: 0   Years of education: 16   Highest education level: Bachelor's degree (e.g., BA, AB, BS)  Occupational History   Occupation: Retired  Tobacco Use   Smoking status: Never    Passive exposure: Never   Smokeless tobacco: Never  Vaping Use   Vaping Use: Never used  Substance and Sexual Activity   Alcohol use: Never   Drug use: Never   Sexual activity: Not Currently  Other Topics Concern   Not on file  Social History Narrative   Patient lives alone in [place].    Patient has 1 cat- Sturgis passed.   Patient has 4 sisters- [place], [place], Rosa Sanchez, and [place].   Patient has been walking around her complex for exercise using wheelchair.    Social Determinants of Health   Financial Resource Strain: Low Risk  (12/31/2021)   Overall Financial Resource Strain (CARDIA)    Difficulty of Paying Living Expenses: Not hard at all  Food Insecurity: No Food Insecurity (12/31/2021)   Hunger Vital Sign    Worried About Running Out of Food in the Last Year: Never true    Ran Out of Food in the Last Year: Never true  Transportation Needs: No Transportation Needs (12/31/2021)   PRAPARE - Hydrologist (Medical): No    Lack of Transportation (Non-Medical): No  Physical Activity: Insufficiently Active (12/31/2021)   Exercise Vital Sign    Days of Exercise per Week: 3 days    Minutes of Exercise per Session: 40 min  Stress: Stress Concern Present (12/31/2021)   [place]    Feeling of Stress : To some extent  Social Connections: Moderately  Isolated (12/31/2021)   Social Connection and Isolation Panel [NHANES]    Frequency of Communication with Friends and Family: More than three times a week    Frequency of Social Gatherings with Friends and Family: Once a week    Attends Religious Services: Never    Marine scientist or Organizations: Yes    Attends Archivist Meetings: Never    Marital Status: Never married  Intimate Partner Violence: Not At Risk (12/31/2021)   Humiliation, Afraid, Rape, and Kick questionnaire    Fear of Current or Ex-Partner: No    Emotionally Abused: No    Physically Abused: No    Sexually Abused: No    Butler Denmark, Laqueta Jean, DNP  [facility] [address] [place], [place] [address] [phone]

## 2022-06-09 ENCOUNTER — Encounter: Payer: Self-pay | Admitting: Neurology

## 2022-06-09 ENCOUNTER — Ambulatory Visit (INDEPENDENT_AMBULATORY_CARE_PROVIDER_SITE_OTHER): Payer: Medicare Other | Admitting: Neurology

## 2022-06-09 VITALS — BP 124/73 | HR 75 | Ht 70.0 in | Wt 173.9 lb

## 2022-06-09 DIAGNOSIS — R269 Unspecified abnormalities of gait and mobility: Secondary | ICD-10-CM

## 2022-06-09 DIAGNOSIS — G119 Hereditary ataxia, unspecified: Secondary | ICD-10-CM

## 2022-06-09 MED ORDER — MYCOPHENOLATE MOFETIL 500 MG PO TABS: 1000.0000 mg | ORAL_TABLET | Freq: Two times a day (BID) | ORAL | 3 refills | Status: DC

## 2022-06-10 LAB — COMPREHENSIVE METABOLIC PANEL
ALT: 20 IU/L (ref 0–32)
AST: 26 IU/L (ref 0–40)
Albumin/Globulin Ratio: 2.1 (ref 1.2–2.2)
Albumin: 4.2 g/dL (ref 3.9–4.9)
Alkaline Phosphatase: 76 IU/L (ref 44–121)
BUN/Creatinine Ratio: 32 — ABNORMAL HIGH (ref 12–28)
BUN: 27 mg/dL (ref 8–27)
Bilirubin Total: 0.3 mg/dL (ref 0.0–1.2)
CO2: 22 mmol/L (ref 20–29)
Calcium: 9.5 mg/dL (ref 8.7–10.3)
Chloride: 105 mmol/L (ref 96–106)
Creatinine, Ser: 0.85 mg/dL (ref 0.57–1.00)
Globulin, Total: 2 g/dL (ref 1.5–4.5)
Glucose: 171 mg/dL — ABNORMAL HIGH (ref 70–99)
Potassium: 4.4 mmol/L (ref 3.5–5.2)
Sodium: 141 mmol/L (ref 134–144)
Total Protein: 6.2 g/dL (ref 6.0–8.5)
eGFR: 74 mL/min/{1.73_m2} (ref >59)

## 2022-06-10 LAB — CBC WITH DIFFERENTIAL/PLATELET
Basophils Absolute: 0 10*3/uL (ref 0.0–0.2)
Basos: 1 %
EOS (ABSOLUTE): 0 10*3/uL (ref 0.0–0.4)
Eos: 1 %
Hematocrit: 37.6 % (ref 34.0–46.6)
Hemoglobin: 12 g/dL (ref 11.1–15.9)
Immature Grans (Abs): 0 10*3/uL (ref 0.0–0.1)
Immature Granulocytes: 0 %
Lymphocytes Absolute: 0.9 10*3/uL (ref 0.7–3.1)
Lymphs: 16 %
MCH: 29.2 pg (ref 26.6–33.0)
MCHC: 31.9 g/dL (ref 31.5–35.7)
MCV: 92 fL (ref 79–97)
Monocytes Absolute: 0.5 10*3/uL (ref 0.1–0.9)
Monocytes: 8 %
Neutrophils Absolute: 4.2 10*3/uL (ref 1.4–7.0)
Neutrophils: 74 %
Platelets: 260 10*3/uL (ref 150–450)
RBC: 4.11 x10E6/uL (ref 3.77–5.28)
RDW: 12.9 % (ref 11.7–15.4)
WBC: 5.7 10*3/uL (ref 3.4–10.8)

## 2022-06-17 ENCOUNTER — Ambulatory Visit (INDEPENDENT_AMBULATORY_CARE_PROVIDER_SITE_OTHER): Payer: Medicare Other | Admitting: Internal Medicine

## 2022-06-17 ENCOUNTER — Telehealth: Payer: Self-pay | Admitting: Internal Medicine

## 2022-06-17 ENCOUNTER — Encounter: Payer: Self-pay | Admitting: Internal Medicine

## 2022-06-17 VITALS — BP 110/70 | HR 71

## 2022-06-17 DIAGNOSIS — E1042 Type 1 diabetes mellitus with diabetic polyneuropathy: Secondary | ICD-10-CM

## 2022-06-17 LAB — POCT GLYCOSYLATED HEMOGLOBIN (HGB A1C): Hemoglobin A1C: 7.1 % — AB (ref 4.0–5.6)

## 2022-06-17 MED ORDER — TRESIBA FLEXTOUCH 100 UNIT/ML ~~LOC~~ SOPN: 14.0000 [IU] | PEN_INJECTOR | Freq: Every day | SUBCUTANEOUS | 3 refills | Status: DC

## 2022-06-17 NOTE — Telephone Encounter (Signed)
Joyce Brown,   I saw Ms. Pereda today but she has not been able to change her insulin to carb ratio, she contacted the company but never heard from them , I tried to do it in the office today but it gives me a " block " and says this feature has to be unlocked.    I am wondering if you or Vaughan Basta are able to help with this     Thank you

## 2022-06-17 NOTE — Patient Instructions (Signed)
-   Tresiba 14 units daily  - Novolog Insulin to carb ratio  one unit for every 6.5 grams of carbohydrates with each meal  Novolog correctional insulin: ADD extra units on insulin to your meal-time Novolog dose if your blood sugars are higher than 195. Use the scale below to help guide you:   Blood sugar before meal Number of units to inject  Less than 195 0 unit  196 - 260 1 units  261 - 325 2 units  326 - 390 3 units  391 - 455 4 units     HOW TO TREAT LOW BLOOD SUGARS (Blood sugar LESS THAN 70 MG/DL) Please follow the RULE OF 15 for the treatment of hypoglycemia treatment (when your (blood sugars are less than 70 mg/dL)   STEP 1: Take 15 grams of carbohydrates when your blood sugar is low, which includes:  3-4 GLUCOSE TABS  OR 3-4 OZ OF JUICE OR REGULAR SODA OR ONE TUBE OF GLUCOSE GEL    STEP 2: RECHECK blood sugar in 15 MINUTES STEP 3: If your blood sugar is still low at the 15 minute recheck --> then, go back to STEP 1 and treat AGAIN with another 15 grams of carbohydrates.

## 2022-06-17 NOTE — Progress Notes (Signed)
Name: Joyce Brown  Age/ Sex: 72 y.o., female   MRN/ DOB: [id number], 1950/05/04     PCP: Lind Covert, MD   Reason for Endocrinology Evaluation: Type 1 Diabetes Mellitus  Initial Endocrine Consultative Visit: 01/17/2018    PATIENT IDENTIFIER: Ms. Jaysie Souffrant is a 72 y.o. female with a past medical history of T1DM, cerebellar ataxia , asthma, OSA on CPAP and Hx of gastric bypass in 2014  . The patient has followed with Endocrinology clinic since 01/17/18 for consultative assistance with management of her diabetes.  Patient moved from [place] in September, 2019 to be close to her sister. She has been diagnosed with stiff person Syndrome but Dr. Jannifer Franklin thought this may be more of cerebellar atrophy. She was on  IVIG until 11/07/2018. She is on cellcept  DIABETIC HISTORY:  Ms. 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 She has only had one DKA episode which was in 11/2017. Marland Kitchen Her hemoglobin A1c has ranged from 8.4% in 01/2017, peaking at 12.1 % in 01/2016  She is followed by Dr. 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 She has been off IVIG since 02/2018. He has been tapering her diazepam down and is currently on cellcept.    Started using the inpen 07/2018    She had a history of right fifth metatarsal fracture in July 2022  SUBJECTIVE:   During the last visit (02/11/2022): A1c 6.8 %    Today (06/17/2022): Ms. Perezperez is here for a months follow up visit on diabetes.  She checks her blood sugars multiple times daily, using  CGM (Dexcom). The patient has had hypoglycemic episodes since the last clinic visit. The patient is symptomatic with these episodes.    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, she continues to be on CellCept  06/09/2022  Planning on relocating to an independent living senior community  She was seen by podiatry 03/26/2022 She continues to follow-up with our CDE, she has transition to insulin to carb ratio As/P cataract surgery vision 20/25 improved 20/40  Denies nausea or diarrhea  Strength is variable   She follows with  bariatric surgeon She continues to follow-up with physical medicine, received trigger point injections  for myofascial pain     HOME DIABETES REGIMEN:  Tresiba 14 units QHS Novolog I: C ratio 1:6 TIDQAC  CF (BG - 110/65)    CONTINUOUS GLUCOSE MONITORING RECORD INTERPRETATION    Dates of Recording: 3/24-3/30/2024 Sensor description:Dexcom  Results statistics:   CGM use % of time 100  Average and SD 184/81  Time in range 77 %  % Time Above 180 5  % Time above 250 17  % Time Below target <1    Glycemic patterns summary: continues to fluctuate during the day and night   Hyperglycemic episodes postprandial  Hypoglycemic episodes occurred at variable times  Overnight periods: no pattern   IN-Pen report 3/20-06/15/2022  Average 153 Missed doses: 1 SD 84 TDD 40.8 units   DIABETIC COMPLICATIONS: Microvascular complications:  Neuropathy, left eye retinopathy Denies: retinopathy, CKD Last eye exam: Completed 05/29/2021   Macrovascular complications:    Denies: CAD, PVD, CVA         HISTORY:  Past Medical History:  Past Medical History:  Diagnosis Date   Cerebellar ataxia ([place])  Diabetes mellitus without complication ([place])    Diabetic neuropathy ([place]) 12/09/2020   Foot fracture 2022   Gait abnormality 11/07/2018   Hyperlipidemia    Pulmonary embolism ([place])    Sleep apnea    Spinal stenosis    Past Surgical History:  Past Surgical History:  Procedure Laterality Date   ABDOMINAL HYSTERECTOMY     ADENOIDECTOMY     ANKLE SURGERY Left    BILATERAL CARPAL TUNNEL RELEASE     BREAST BIOPSY Left 2019   benign   ELBOW SURGERY     GASTRIC  BYPASS  2014   HAND NEUROPLASTY     radio denervation  05/21/2020   TONSILLECTOMY     Social History:  reports that she has never smoked. She has never been exposed to tobacco smoke. She has never used smokeless tobacco. She reports that she does not drink alcohol and does not use drugs. Family History:  Family History  Problem Relation Age of Onset   Cervical cancer Mother    Ovarian cancer Mother    Dementia Father    Retinal detachment Father    Heart attack Father    Diabetes Father    Stroke Father    Hypertension Father    Ovarian cancer Sister    Arthritis Sister    Arthritis Sister    Arthritis Sister    Glaucoma Paternal Grandfather      HOME MEDICATIONS: Allergies as of 06/17/2022   No Known Allergies      Medication List        Accurate as of June 17, 2022  2:51 PM. If you have any questions, ask your nurse or doctor.          STOP taking these medications    Lantus SoloStar 100 UNIT/ML Solostar Pen Generic drug: insulin glargine Stopped by: Dorita Sciara, MD       TAKE these medications    Acetaminophen 500 MG capsule Take 500 mg by mouth every 6 (six) hours as needed for moderate pain.   antiseptic oral rinse Liqd 15 mLs by Mouth Rinse route in the morning, at noon, in the evening, and at bedtime.   atorvastatin 80 MG tablet Commonly known as: LIPITOR TAKE ONE TABLET BY MOUTH DAILY   Bariatric Fusion Chew Chew 2 tablets by mouth 2 (two) times daily.   Bariatric Fusion Chew Chew by mouth.   BD Insulin Syringe U/F 1/2Unit 31G X 5/16" 0.3 ML Misc Generic drug: Insulin Syringe-Needle U-100 USE 3 TIMES A DAY   Dexcom G7 Sensor Misc by Does not apply route.   Droplet Pen Needles 31G X 5 MM Misc Generic drug: Insulin Pen Needle USE 4 TIMES DAILY AS DIRECTED BY YOUR DOCTOR   gabapentin 100 MG capsule Commonly known as: NEURONTIN TAKE ONE CAPSULE BY MOUTH THREE TIMES A DAY   glucose blood test strip ASCENSIA CONTOUR NEXT  ONE: Use 1 strip to check blood sugar daily   Gvoke HypoPen 1-Pack 1 MG/0.2ML Soaj Generic drug: Glucagon Inject 1 mg into the skin as directed.   Microlet Lancets Misc 3 (three) times daily.   mycophenolate 500 MG tablet Commonly known as: CELLCEPT Take 2 tablets (1,000 mg total) by mouth 2 (two) times daily.   Myrbetriq 50 MG Tb24 tablet Generic drug: mirabegron ER Take 50 mg by mouth daily.   NON FORMULARY Take 75 mg by mouth daily in the afternoon.   NovoLOG PenFill cartridge Generic drug: insulin aspart USE MAX OF DAILY DOSE  OF 60 UNITS PER CORRECTION SCALE AS DIRECTED   sertraline 100 MG tablet Commonly known as: ZOLOFT TAKE ONE TABLET BY MOUTH DAILY   SYSTANE FREE OP Apply 1 drop to eye 3 (three) times daily.   tiZANidine 2 MG tablet Commonly known as: ZANAFLEX TAKE 1 TO 2 TABLETS BY MOUTH AT BEDTIME AS NEEDED FOR MUSCLE SPASMS   Tresiba FlexTouch 100 UNIT/ML FlexTouch Pen Generic drug: insulin degludec Inject 14 Units into the skin daily. Started by: Dorita Sciara, MD        PHYSICAL EXAM: VS: BP 110/70   Pulse 71   SpO2 99%    EXAM: General: Pt appears well and is in NAD  Lungs: Clear with good BS bilat with no rales, rhonchi, or wheezes  Heart: Auscultation: RRR   Extremities:  BL LE: no pretibial edema   Mental Status: Judgment, insight: intact Mood and affect: no depression, anxiety, or agitation   DM Foot Exam 03/26/2022 per podiatry      DATA REVIEWED:  Lab Results  Component Value Date   HGBA1C 7.1 (A) 06/17/2022   HGBA1C 6.8 (A) 02/11/2022   HGBA1C 7.9 (A) 09/28/2021    Latest Reference Range & Units 12/01/21 11:22  Sodium 134 - 144 mmol/L 138  Potassium 3.5 - 5.2 mmol/L 5.1  Chloride 96 - 106 mmol/L 99  CO2 20 - 29 mmol/L 23  Glucose 70 - 99 mg/dL 436 (H)  BUN 8 - 27 mg/dL 24  Creatinine 0.57 - 1.00 mg/dL 1.00  Calcium 8.7 - 10.3 mg/dL 10.0  BUN/Creatinine Ratio 12 - 28  24  eGFR >59 mL/min/1.73 61  Alkaline  Phosphatase 44 - 121 IU/L 74  Albumin 3.9 - 4.9 g/dL 4.2  Albumin/Globulin Ratio 1.2 - 2.2  1.8  AST 0 - 40 IU/L 25  ALT 0 - 32 IU/L 29  Total Protein 6.0 - 8.5 g/dL 6.5  Total Bilirubin 0.0 - 1.2 mg/dL 0.4      Latest Reference Range & Units 06/26/21 10:39  Creatinine,U mg/dL 82.4  Microalb, Ur 0.0 - 1.9 mg/dL <0.7  MICROALB/CREAT RATIO 0.0 - 30.0 mg/g 0.8    ASSESSMENT / PLAN / RECOMMENDATIONS:   1) Type 1 Diabetes Mellitus, Optimally  Controlled - Most recent A1c of 7.1%. Goal A1c < 7.5 %.   -Her A1c has trended up but remains within acceptable range -The patient is extremely sensitive to insulin and carbohydrates which makes it difficult to optimize her glucose control -Due to insurance issues we had to switch to Lantus, but her current formulary does not support Lantus so we switched her back to [place] -We will continue the current dose of Tresiba as below -She was taught to count her carbohydrates, and has been doing an awesome job added, but unfortunately she has not been able to adjust her insulin to carb ratio on the InPen, she tried to contact [employer] without success -I tried to change it myself today, but unfortunately there is an manufacturer lock and she will need to contact the [employer] directly -I have also sent a message to our CDE's to see if they can help with any of it  MEDICATIONS: Continue Tresiba 14 units daily  Change NovoLog I:C ratio 1:6.5 TIDQAC continue  Sensitivity factor 65     EDUCATION / INSTRUCTIONS: BG monitoring instructions: Patient is instructed to check her blood sugars 4 times a day with CGM caliberation. Call [facility] if: BG persistently < 70  I reviewed the Rule  of 15 for the treatment of hypoglycemia in detail with the patient.     2) Diabetic complications:  Eye: Does not have known diabetic retinopathy.  Neuro/ Feet: Does not have known diabetic peripheral neuropathy. Renal: Patient does not  have known baseline CKD. She is not on an ACEI/ARB at present.         F/U in 4 months   I spent 30 minutes preparing to see the patient by review of recent labs, imaging and procedures, obtaining and reviewing separately obtained history, communicating with the patient, ordering medications, and documenting clinical information in the EHR including the differential Dx, treatment, and any further evaluation and other management   Signed electronically by: Mack Guise, MD  [facility]  [address] [place], [place] [address] Phone: [phone] FAX: [phone]   CC: Lind Covert, [place] [address] Phone: [phone]  Fax: [phone]  Return to Endocrinology clinic as below: Future Appointments  Date Time Provider [place]  07/09/2022  9:30 AM Marzetta Board, DPM [facility]  07/19/2022  9:20 AM Courtney Heys, MD [facility]  09/09/2022 12:50 PM Chella Chapdelaine, Melanie Crazier, MD [facility] None  10/07/2022  9:45 AM Clydell Hakim, RD [place] [facility]  12/07/2022 10:30 AM Marcial Pacas, MD [facility] None

## 2022-06-18 ENCOUNTER — Telehealth: Payer: Self-pay

## 2022-06-18 NOTE — Telephone Encounter (Signed)
Jacqlyn Larsen called with [employer] to change patient Inpen to card counting. Will need a call back today to change order at [phone] and press option 9.

## 2022-06-18 NOTE — Telephone Encounter (Signed)
[employer] contacted and advised provider is ok with unlocking app feature. It should take about an hour but pt will be contacted.

## 2022-06-19 ENCOUNTER — Encounter: Payer: Self-pay | Admitting: Internal Medicine

## 2022-06-20 ENCOUNTER — Other Ambulatory Visit: Payer: Self-pay

## 2022-06-20 MED ORDER — DROPLET PEN NEEDLES 31G X 5 MM MISC: 5 refills | Status: DC

## 2022-06-21 ENCOUNTER — Encounter: Payer: Self-pay | Admitting: Internal Medicine

## 2022-06-21 NOTE — Telephone Encounter (Signed)
Patient reported that she needed assistance in changing the settings on her InPen to I/C ratio, because she was on a fixed dose.    She reports that she has called customer assitance and they unlocked her pen and help her to make the changes needed.  She reported that she put in a carb ration of 1u/6.5 grams of carb, and that she has a very good understanding of what carbs are, and how much she is eating.  She had no questions for me at this time

## 2022-07-05 ENCOUNTER — Encounter: Payer: Self-pay | Admitting: Internal Medicine

## 2022-07-05 ENCOUNTER — Other Ambulatory Visit: Payer: Self-pay | Admitting: Internal Medicine

## 2022-07-07 ENCOUNTER — Telehealth: Payer: Self-pay

## 2022-07-07 NOTE — Telephone Encounter (Signed)
Inpen report placed on your desk for review.

## 2022-07-07 NOTE — Telephone Encounter (Signed)
This was addressed yesterday and I:C changed 1:6 with breakfast

## 2022-07-09 ENCOUNTER — Encounter: Payer: Self-pay | Admitting: Podiatry

## 2022-07-09 ENCOUNTER — Ambulatory Visit (INDEPENDENT_AMBULATORY_CARE_PROVIDER_SITE_OTHER): Payer: Medicare Other | Admitting: Podiatry

## 2022-07-09 DIAGNOSIS — M79675 Pain in left toe(s): Secondary | ICD-10-CM

## 2022-07-09 DIAGNOSIS — M79674 Pain in right toe(s): Secondary | ICD-10-CM | POA: Diagnosis not present

## 2022-07-09 DIAGNOSIS — E1042 Type 1 diabetes mellitus with diabetic polyneuropathy: Secondary | ICD-10-CM | POA: Diagnosis not present

## 2022-07-09 DIAGNOSIS — B351 Tinea unguium: Secondary | ICD-10-CM | POA: Diagnosis not present

## 2022-07-09 NOTE — Progress Notes (Unsigned)
  Subjective:  Patient ID: Joyce Brown, female    DOB: [birth year],  MRN: [id number]  Zoi Devine presents to clinic today for {jgcomplaint:23593}  Chief Complaint  Patient presents with   Diabetes    [facility] BS - 139 A1C - 7.1 LVPCP - 06/17/2022   New problem(s): None. {jgcomplaint:23593}  PCP is Carney Living, MD.  No Known Allergies  Review of Systems: Negative except as noted in the HPI.  Objective: No changes noted in today's physical examination. There were no vitals filed for this visit. Precilla Purnell is a pleasant 72 y.o. female {jgbodyhabitus:24098} AAO x 3.  Assessment/Plan: No diagnosis found.  No orders of the defined types were placed in this encounter.   None {Jgplan:23602::"-Patient/POA to call should there be question/concern in the interim."}   Return in about 3 months (around 10/08/2022).  Freddie Breech, DPM

## 2022-07-19 ENCOUNTER — Encounter: Payer: Self-pay | Admitting: Physical Medicine and Rehabilitation

## 2022-07-19 ENCOUNTER — Encounter
Payer: Medicare Other | Attending: Physical Medicine and Rehabilitation | Admitting: Physical Medicine and Rehabilitation

## 2022-07-19 VITALS — BP 120/74 | HR 66 | Ht 70.0 in | Wt 174.4 lb

## 2022-07-19 DIAGNOSIS — M7918 Myalgia, other site: Secondary | ICD-10-CM | POA: Insufficient documentation

## 2022-07-19 DIAGNOSIS — Z993 Dependence on wheelchair: Secondary | ICD-10-CM | POA: Diagnosis not present

## 2022-07-19 DIAGNOSIS — G119 Hereditary ataxia, unspecified: Secondary | ICD-10-CM

## 2022-07-19 MED ORDER — LIDOCAINE HCL 1 % IJ SOLN
3.0000 mL | Freq: Once | INTRAMUSCULAR | Status: AC
Start: 2022-07-19 — End: 2022-07-19
  Administered 2022-07-19: 3 mL

## 2022-07-19 NOTE — Patient Instructions (Signed)
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 She Has cerebellar ataxia, so needs to be in a w/c at all times- - doesn't drive of note; but would do best with light weight or ultra light weight w/c due to neurological dx. Pushes w/c with feet AND hands. Uses a comfort active back.  Currently has a Quickie w/c and she likes it- but it's  70 years old- the brakes won't stay tightened; wheels are getting bald; casters are bald; won't go straight, so she and I think it might be bent in frame- pulls right. Also back needs to be replaced.  Needs a hybrid Vonna Kotyk Fusion- is 70 years old- if it's time to replace- has dramatically helped her back pain when switched to Hybrid cushion.  Of note, can get skin breakdown if doesn't keep cushion inflated- ischial tuberosities usually  Will give Rx for W/C evaluation to get new w/c. D/w Barbara Cower from [place].   6.  Patient here for trigger point injections for  Consent done and on chart.  Cleaned areas with alcohol and injected using a 27 gauge 1.5 inch needle  Injected 2.5 cc- wasted 0.5 cc Using 1% Lidocaine with no EPI  Upper traps Levators R only Posterior scalenes B/L Middle scalenes R only Splenius Capitus Pectoralis Major Rhomboids Infraspinatus Teres Major/minor Thoracic paraspinals Lumbar paraspinals Other injections- First dorsal interossei on R hand   Patient's level of pain prior was  3.5/10 Current level of pain after injections is about the same  There was no bleeding or complications.  Patient was advised to drink a lot of water on day after injections to flush system Will have increased soreness for 12-48 hours after injections.  Can use Lidocaine patches the day AFTER injections Can use theracane on day of injections in places didn't inject Can use heating pad 4-6 hours AFTER  injections  7. F/U in 3 months For TrP injections and f/u on cerebellar ataxia

## 2022-07-19 NOTE — Progress Notes (Signed)
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 Has Cerebellar atrophy with ataxia and also chronic low back pain s/p radiofrequency ablation 3/22; Here for f/u on Cerebellar ataxia and trigger point injections. Has has Myofascial pain and fibromyalgia.  Here for f/u on cerebellar ataxia and trigger point injections.     When tilts head back, gets dizzy-less than 1 minute.  If heads turned to R side or tilts to R shoulder, will get more pain, but no dizziness.   Also, time for new w/c- hers is worn out as described below- wants to go with [facility].    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 She Has cerebellar ataxia, so needs to be in a w/c at all times- - doesn't drive of note; but would do best with light weight or ultra light weight w/c due to neurological dx. Pushes w/c with feet AND hands. Uses a comfort active back.  Currently has a Quickie w/c and she likes it- but it's  70 years old- the brakes won't stay tightened; wheels are getting bald; casters are bald; won't go straight, so she and I think it might be bent in frame- pulls right. Also back needs to be replaced.  Needs a hybrid Vonna Kotyk Fusion- is 72 years old- if it's time to replace- has dramatically helped her back pain when switched to Hybrid cushion.  Of note, can get skin breakdown if doesn't keep cushion inflated- ischial tuberosities usually  Will give Rx for W/C evaluation to get new w/c. D/w Barbara Cower from [place].   6.  Patient here for trigger point injections for  Consent done and on chart.  Cleaned areas with alcohol and injected using a 27 gauge 1.5 inch needle  Injected 2.5 cc- wasted 0.5 cc Using 1% Lidocaine with no EPI  Upper traps Levators R only Posterior scalenes  B/L Middle scalenes R only Splenius Capitus Pectoralis Major Rhomboids Infraspinatus Teres Major/minor Thoracic paraspinals Lumbar paraspinals Other injections- First dorsal interossei on R hand   Patient's level of pain prior was  3.5/10 Current level of pain after injections is about the same  There was no bleeding or complications.  Patient was advised to drink a lot of water on day after injections to flush system Will have increased soreness for 12-48 hours after injections.  Can use Lidocaine patches the day AFTER injections Can use theracane on day of injections in places didn't inject Can use heating pad 4-6 hours AFTER injections  7. F/U in 3 months For TrP injections and f/u on cerebellar ataxia    I spent a total of  31  minutes on total care today- >50% coordination of care- due to  8 minutes on injections and rest on phone with [facility] and d/w pt about w/c needs.

## 2022-07-23 DIAGNOSIS — N9489 Other specified conditions associated with female genital organs and menstrual cycle: Secondary | ICD-10-CM | POA: Diagnosis not present

## 2022-07-23 DIAGNOSIS — K5900 Constipation, unspecified: Secondary | ICD-10-CM | POA: Diagnosis not present

## 2022-07-23 DIAGNOSIS — N3941 Urge incontinence: Secondary | ICD-10-CM | POA: Diagnosis not present

## 2022-07-23 DIAGNOSIS — N3281 Overactive bladder: Secondary | ICD-10-CM | POA: Diagnosis not present

## 2022-07-23 DIAGNOSIS — N952 Postmenopausal atrophic vaginitis: Secondary | ICD-10-CM | POA: Diagnosis not present

## 2022-07-23 DIAGNOSIS — G119 Hereditary ataxia, unspecified: Secondary | ICD-10-CM | POA: Diagnosis not present

## 2022-07-29 ENCOUNTER — Encounter: Payer: Self-pay | Admitting: Internal Medicine

## 2022-08-04 DIAGNOSIS — G119 Hereditary ataxia, unspecified: Secondary | ICD-10-CM | POA: Diagnosis not present

## 2022-08-04 DIAGNOSIS — Z7409 Other reduced mobility: Secondary | ICD-10-CM | POA: Diagnosis not present

## 2022-08-12 DIAGNOSIS — H04123 Dry eye syndrome of bilateral lacrimal glands: Secondary | ICD-10-CM | POA: Diagnosis not present

## 2022-08-12 DIAGNOSIS — H04129 Dry eye syndrome of unspecified lacrimal gland: Secondary | ICD-10-CM | POA: Insufficient documentation

## 2022-08-17 ENCOUNTER — Ambulatory Visit (INDEPENDENT_AMBULATORY_CARE_PROVIDER_SITE_OTHER): Payer: Medicare Other | Admitting: Family Medicine

## 2022-08-17 ENCOUNTER — Encounter: Payer: Self-pay | Admitting: Family Medicine

## 2022-08-17 VITALS — BP 134/64 | HR 72

## 2022-08-17 DIAGNOSIS — R21 Rash and other nonspecific skin eruption: Secondary | ICD-10-CM | POA: Diagnosis not present

## 2022-08-17 DIAGNOSIS — F4323 Adjustment disorder with mixed anxiety and depressed mood: Secondary | ICD-10-CM | POA: Diagnosis not present

## 2022-08-17 DIAGNOSIS — Z515 Encounter for palliative care: Secondary | ICD-10-CM | POA: Diagnosis not present

## 2022-08-17 DIAGNOSIS — D099 Carcinoma in situ, unspecified: Secondary | ICD-10-CM | POA: Diagnosis not present

## 2022-08-17 DIAGNOSIS — E1042 Type 1 diabetes mellitus with diabetic polyneuropathy: Secondary | ICD-10-CM

## 2022-08-17 DIAGNOSIS — K623 Rectal prolapse: Secondary | ICD-10-CM | POA: Diagnosis not present

## 2022-08-17 DIAGNOSIS — E785 Hyperlipidemia, unspecified: Secondary | ICD-10-CM | POA: Diagnosis not present

## 2022-08-17 MED ORDER — SERTRALINE HCL 100 MG PO TABS: 100.0000 mg | ORAL_TABLET | Freq: Every day | ORAL | 2 refills | Status: DC

## 2022-08-17 MED ORDER — TIZANIDINE HCL 2 MG PO TABS: ORAL_TABLET | ORAL | 2 refills | Status: DC

## 2022-08-17 MED ORDER — GABAPENTIN 100 MG PO CAPS: 100.0000 mg | ORAL_CAPSULE | Freq: Three times a day (TID) | ORAL | 2 refills | Status: DC

## 2022-08-17 MED ORDER — ATORVASTATIN CALCIUM 80 MG PO TABS: 80.0000 mg | ORAL_TABLET | Freq: Every day | ORAL | 3 refills | Status: DC

## 2022-08-17 NOTE — Assessment & Plan Note (Signed)
These seem most consistent with skin breakdown due to dryness and likely her immunotherapy,  Unlikely to be skin cancer. Treat with Vaseline twice a day and monitor

## 2022-08-17 NOTE — Patient Instructions (Signed)
Good to see you today - Thank you for coming in  Things we discussed today:  Areas on face  Use vaseline twice a day for one month and let me know and send pictures how they are doing If not improving or worsening let me know sooner  Rectal Prolapse Continue with stool softner Let me know if not stable  Come back in 6 months   Come back to see me in 6 months

## 2022-08-17 NOTE — Assessment & Plan Note (Signed)
Discussed and completed her MOST form.  She wishes no resuscitation and comfort measures only

## 2022-08-17 NOTE — Progress Notes (Signed)
    SUBJECTIVE:   CHIEF COMPLAINT / HPI:   Scaly areas on face Came up slowly over months.  Do not seem to be changing. Wonders if could be skin cancer.  Not using any medications on them  Squamous cell on Leg Seems completely healed,  No pain or mass  Mood Good with her sertraline.  Does not want to adjust  Neuropathy Stable with gabapentin  Hyperlipidemia  Taking lipitor daily.  Brings in her own list of her medications  EOL Wishes to fill out MOST form   OBJECTIVE:   BP 134/64   Pulse 72   SpO2 100%   Heart - Regular rate and rhythm.  No murmurs, gallops or rubs.    Lungs:  Normal respiratory effort, chest expands symmetrically. Lungs are clear to auscultation, no crackles or wheezes.    ASSESSMENT/PLAN:   Adjustment disorder with mixed anxiety and depressed mood Assessment & Plan: Stable on sertraline.  Will continue   Type 1 diabetes mellitus with diabetic polyneuropathy (HCC) -     Atorvastatin Calcium; Take 1 tablet (80 mg total) by mouth daily.  Dispense: 90 tablet; Refill: 3  Dyslipidemia Assessment & Plan: Taking atorvastatin regularly - continue   Squamous cell carcinoma in situ Assessment & Plan: Well healed scar with no palpable mass.  No further intervention needed    Facial rash Assessment & Plan: These seem most consistent with skin breakdown due to dryness and likely her immunotherapy,  Unlikely to be skin cancer. Treat with Vaseline twice a day and monitor   Rectal prolapse Assessment & Plan: Reports intermittent mild prolapse that she can reduce easily.   Better since she started colace.  Decided to monitor but if worsening she will contact me and will refer to colorectal surgery   End of life care Assessment & Plan: Discussed and completed her MOST form.  She wishes no resuscitation and comfort measures only    Other orders -     tiZANidine HCl; TAKE 1 TO 2 TABLETS BY MOUTH AT BEDTIME AS NEEDED FOR MUSCLE SPASMS  Dispense: 360  tablet; Refill: 2 -     Sertraline HCl; Take 1 tablet (100 mg total) by mouth daily.  Dispense: 90 tablet; Refill: 2 -     Gabapentin; Take 1 capsule (100 mg total) by mouth 3 (three) times daily.  Dispense: 180 capsule; Refill: 2     Patient Instructions  Good to see you today - Thank you for coming in  Things we discussed today:  Areas on face  Use vaseline twice a day for one month and let me know and send pictures how they are doing If not improving or worsening let me know sooner  Rectal Prolapse Continue with stool softner Let me know if not stable  Come back in 6 months   Come back to see me in 6 months   Carney Living, MD [facility]

## 2022-08-17 NOTE — Assessment & Plan Note (Signed)
Reports intermittent mild prolapse that she can reduce easily.   Better since she started colace.  Decided to monitor but if worsening she will contact me and will refer to colorectal surgery

## 2022-08-17 NOTE — Assessment & Plan Note (Signed)
Stable on sertraline.  Will continue

## 2022-08-17 NOTE — Assessment & Plan Note (Signed)
Well healed scar with no palpable mass.  No further intervention needed

## 2022-08-17 NOTE — Assessment & Plan Note (Signed)
Taking atorvastatin regularly - continue

## 2022-08-24 ENCOUNTER — Encounter: Payer: Self-pay | Admitting: Internal Medicine

## 2022-08-26 DIAGNOSIS — H04123 Dry eye syndrome of bilateral lacrimal glands: Secondary | ICD-10-CM | POA: Diagnosis not present

## 2022-09-01 ENCOUNTER — Other Ambulatory Visit: Payer: Self-pay | Admitting: Family Medicine

## 2022-09-01 DIAGNOSIS — Z1231 Encounter for screening mammogram for malignant neoplasm of breast: Secondary | ICD-10-CM

## 2022-09-06 MED ORDER — NOVOLOG PENFILL 100 UNIT/ML ~~LOC~~ SOCT: SUBCUTANEOUS | 2 refills | Status: DC

## 2022-09-09 ENCOUNTER — Encounter: Payer: Self-pay | Admitting: Internal Medicine

## 2022-09-09 ENCOUNTER — Ambulatory Visit (INDEPENDENT_AMBULATORY_CARE_PROVIDER_SITE_OTHER): Payer: Medicare Other | Admitting: Internal Medicine

## 2022-09-09 VITALS — BP 128/80 | HR 74 | Ht 70.0 in | Wt 172.0 lb

## 2022-09-09 DIAGNOSIS — E1065 Type 1 diabetes mellitus with hyperglycemia: Secondary | ICD-10-CM

## 2022-09-09 DIAGNOSIS — Z9884 Bariatric surgery status: Secondary | ICD-10-CM | POA: Diagnosis not present

## 2022-09-09 DIAGNOSIS — E785 Hyperlipidemia, unspecified: Secondary | ICD-10-CM | POA: Diagnosis not present

## 2022-09-09 LAB — MICROALBUMIN / CREATININE URINE RATIO
Creatinine,U: 117.3 mg/dL
Microalb Creat Ratio: 0.6 mg/g (ref 0.0–30.0)
Microalb, Ur: <0.7 mg/dL (ref 0.0–1.9)

## 2022-09-09 LAB — POCT GLYCOSYLATED HEMOGLOBIN (HGB A1C): Hemoglobin A1C: 8.2 % — AB (ref 4.0–5.6)

## 2022-09-09 LAB — LIPID PANEL
Cholesterol: 139 mg/dL (ref 0–200)
HDL: 46.6 mg/dL (ref >39.00)
LDL Cholesterol: 72 mg/dL (ref 0–99)
NonHDL: 92.56
Total CHOL/HDL Ratio: 3
Triglycerides: 104 mg/dL (ref 0.0–149.0)
VLDL: 20.8 mg/dL (ref 0.0–40.0)

## 2022-09-09 NOTE — Progress Notes (Signed)
Name: Joyce Brown  Age/ Sex: 72 y.o., female   MRN/ DOB: [id number], [birth year]     PCP: Carney Living, MD   Reason for Endocrinology Evaluation: Type 1 Diabetes Mellitus  Initial Endocrine Consultative Visit: 01/17/2018    PATIENT IDENTIFIER: Joyce Brown is a 72 y.o. female with a past medical history of T1DM, cerebellar ataxia , asthma, OSA on CPAP and Hx of gastric bypass in 2014  . The patient has followed with Endocrinology clinic since 01/17/18 for consultative assistance with management of her diabetes.  Patient moved from [place] in September, 2019 to be close to her sister. She has been diagnosed with stiff person Syndrome but Dr. Anne Hahn thought this may be more of cerebellar atrophy. She was on  IVIG until 11/07/2018. 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 She has only had one DKA episode which was in 11/2017. Marland Kitchen Her hemoglobin A1c has ranged from 8.4% in 01/2017, peaking at 12.1 % in 01/2016  She is followed by Dr. 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 She has been off IVIG since 02/2018. He has been tapering her diazepam down and is currently on cellcept.    Started using the inpen 07/2018    She had a history of right fifth metatarsal fracture in July 2022  SUBJECTIVE:   During the last visit (02/11/2022): A1c 6.8 %    Today (09/09/2022): Joyce Brown is here for a months follow up visit on diabetes.  She checks her blood sugars multiple times daily, using  CGM (Dexcom). The patient has had hypoglycemic episodes since the last clinic visit. The patient is symptomatic with these episodes.    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, she continues to be on CellCept   She has  been following up with Uro-Gyn for incontinence   Planning on relocating to [facility]  She was seen by podiatry 03/26/2022, scheduled for f/u 10/2022  Denies nausea or   Has noted constipation, taking Colace  She has noted dry mouth  Denies nausea or vomiting  Continues with exercise      HOME DIABETES REGIMEN:  Tresiba 14 units QHS Novolog I: C ratio 1:6 TIDQAC  CF (BG - 110/65)    CONTINUOUS GLUCOSE MONITORING RECORD INTERPRETATION    Dates of Recording: 6/14-6/27/2024 Sensor description:Dexcom  Results statistics:   CGM use % of time 93  Average and SD 201/74  Time in range 45 %  % Time Above 180 28  % Time above 250 25  % Time Below target <1    Glycemic patterns summary: BG's trend down overnight and increased throughout the day  Hyperglycemic episodes postprandial  Hypoglycemic episodes occurred N/A  Overnight periods: Variable  INPEN REPORT          DIABETIC COMPLICATIONS: Microvascular complications:  Neuropathy, left eye retinopathy Denies: retinopathy, CKD Last eye exam: Completed 05/29/2021   Macrovascular complications:    Denies: CAD, PVD, CVA         HISTORY:  Past Medical History:  Past Medical History:  Diagnosis Date   Cerebellar ataxia (HCC)    Diabetes mellitus without complication (HCC)    Diabetic neuropathy (HCC) 12/09/2020   Foot fracture 2022   Gait abnormality 11/07/2018  Hyperlipidemia    Pulmonary embolism (HCC)    Sleep apnea    Spinal stenosis    Past Surgical History:  Past Surgical History:  Procedure Laterality Date   ABDOMINAL HYSTERECTOMY     ADENOIDECTOMY     ANKLE SURGERY Left    BILATERAL CARPAL TUNNEL RELEASE     BREAST BIOPSY Left 2019   benign   ELBOW SURGERY     GASTRIC BYPASS  2014   HAND NEUROPLASTY     radio denervation  05/21/2020   TONSILLECTOMY     Social History:  reports that she has never smoked. She has never been exposed to tobacco smoke. She has never used smokeless  tobacco. She reports that she does not drink alcohol and does not use drugs. Family History:  Family History  Problem Relation Age of Onset   Cervical cancer Mother    Ovarian cancer Mother    Dementia Father    Retinal detachment Father    Heart attack Father    Diabetes Father    Stroke Father    Hypertension Father    Ovarian cancer Sister    Arthritis Sister    Arthritis Sister    Arthritis Sister    Glaucoma Paternal Grandfather      HOME MEDICATIONS: Allergies as of 09/09/2022   No Known Allergies      Medication List        Accurate as of September 09, 2022 12:30 PM. If you have any questions, ask your nurse or doctor.          STOP taking these medications    SYSTANE FREE OP Stopped by: Scarlette Shorts, MD       TAKE these medications    Acetaminophen 500 MG capsule Take 500 mg by mouth every 6 (six) hours as needed for moderate pain.   antiseptic oral rinse Liqd 15 mLs by Mouth Rinse route in the morning, at noon, in the evening, and at bedtime.   atorvastatin 80 MG tablet Commonly known as: LIPITOR Take 1 tablet (80 mg total) by mouth daily.   BD Insulin Syringe U/F 1/2Unit 31G X 5/16" 0.3 ML Misc Generic drug: Insulin Syringe-Needle U-100 USE 3 TIMES A DAY   Dexcom G7 Sensor Misc by Does not apply route.   docusate sodium 100 MG capsule Commonly known as: COLACE Take 100 mg by mouth daily as needed for mild constipation.   Droplet Pen Needles 31G X 5 MM Misc Generic drug: Insulin Pen Needle USE FOUR TIMES A DAY AS DIRECTED BY YOUR DOCTOR   estradiol 0.1 MG/GM vaginal cream Commonly known as: ESTRACE Place 1 Applicatorful vaginally 2 (two) times a week.   gabapentin 100 MG capsule Commonly known as: NEURONTIN Take 1 capsule (100 mg total) by mouth 3 (three) times daily.   Gemtesa 75 MG Tabs Generic drug: Vibegron Take 75 mg by mouth at bedtime.   glucose blood test strip ASCENSIA CONTOUR NEXT ONE: Use 1 strip to check blood  sugar daily   Gvoke HypoPen 1-Pack 1 MG/0.2ML Soaj Generic drug: Glucagon Inject 1 mg into the skin as directed.   Microlet Lancets Misc 3 (three) times daily.   mycophenolate 500 MG tablet Commonly known as: CELLCEPT Take 2 tablets (1,000 mg total) by mouth 2 (two) times daily.   NovoLOG PenFill cartridge Generic drug: insulin aspart USE MAX OF DAILY DOSE OF 60 UNITS PER CORRECTION SCALE AS DIRECTED   Omega 3 1200 MG Caps Take by mouth. One  daily   sertraline 100 MG tablet Commonly known as: ZOLOFT Take 1 tablet (100 mg total) by mouth daily.   tiZANidine 2 MG tablet Commonly known as: ZANAFLEX TAKE 1 TO 2 TABLETS BY MOUTH AT BEDTIME AS NEEDED FOR MUSCLE SPASMS   Tresiba FlexTouch 100 UNIT/ML FlexTouch Pen Generic drug: insulin degludec Inject 14 Units into the skin daily.        PHYSICAL EXAM: VS: BP 128/80 (BP Location: Left Arm, Patient Position: Sitting, Cuff Size: Large)   Pulse 74   Ht 5\' 10"  (1.778 m)   Wt 172 lb (78 kg)   SpO2 99%   BMI 24.68 kg/m    EXAM: General: Pt appears well and is in NAD  Lungs: Clear with good BS bilat with no rales, rhonchi, or wheezes  Heart: Auscultation: RRR   Extremities:  BL LE: no pretibial edema   Mental Status: Judgment, insight: intact Mood and affect: no depression, anxiety, or agitation   DM Foot Exam 03/26/2022 per podiatry      DATA REVIEWED:  Lab Results  Component Value Date   HGBA1C 8.2 (A) 09/09/2022   HGBA1C 7.1 (A) 06/17/2022   HGBA1C 6.8 (A) 02/11/2022    Latest Reference Range & Units 06/09/22 11:03  Sodium 134 - 144 mmol/L 141  Potassium 3.5 - 5.2 mmol/L 4.4  Chloride 96 - 106 mmol/L 105  CO2 20 - 29 mmol/L 22  Glucose 70 - 99 mg/dL 161 (H)  BUN 8 - 27 mg/dL 27  Creatinine 0.96 - 0.45 mg/dL 4.09  Calcium 8.7 - 81.1 mg/dL 9.5  BUN/Creatinine Ratio 12 - 28  32 (H)  eGFR >59 mL/min/1.73 74  Alkaline Phosphatase 44 - 121 IU/L 76  Albumin 3.9 - 4.9 g/dL 4.2  Albumin/Globulin Ratio 1.2 -  2.2  2.1  AST 0 - 40 IU/L 26  ALT 0 - 32 IU/L 20  Total Protein 6.0 - 8.5 g/dL 6.2  Total Bilirubin 0.0 - 1.2 mg/dL 0.3     Latest Reference Range & Units 09/09/22 13:12  Total CHOL/HDL Ratio  3  Cholesterol 0 - 200 mg/dL 914  HDL Cholesterol >78.29 mg/dL 56.21  LDL (calc) 0 - 99 mg/dL 72  MICROALB/CREAT RATIO 0.0 - 30.0 mg/g 0.6  NonHDL  92.56  Triglycerides 0.0 - 149.0 mg/dL 308.6  VLDL 0.0 - 57.8 mg/dL 46.9    Latest Reference Range & Units 09/09/22 13:12  Creatinine,U mg/dL 629.5  Microalb, Ur 0.0 - 1.9 mg/dL <2.8  MICROALB/CREAT RATIO 0.0 - 30.0 mg/g 0.6     ASSESSMENT / PLAN / RECOMMENDATIONS:   1) Type 1 Diabetes Mellitus, Poorly  Controlled - Most recent A1c of 8.2%. Goal A1c < 7.0 %.   -Patient has been noted with worsening glycemic control, A1c has trended up from 7.1% to 8.2% -The patient is extremely sensitive to insulin as well as carbohydrates which makes it difficult to optimize her glucose control.  The patient has a history of gastric bypass which causes variability in carbohydrate absorption and result in glycemic excrusions -She was trained on carbohydrate counting through our CDE, but unfortunately since switching her settings on the InPen from a standing dose to carbohydrate counting, she has been noted with persistent hyperglycemia, the InPen lacks the flexibility and changing her insulin to carb ratio based on her meal, as it appears that she needs a tighter insulin to carb ratio for breakfast, there is also lack of flexibility in the Inpen , to change her insulin to carb  ratio by half digits.  Since I am unable to change her insulin to carb ratio to 1:5.5 for breakfast and keep the rest of the meals at 1:6 , we have opted to switch her back to a standing dose of NovoLog as that has worked better for her in the past -I will also increase [place] as below -We also discussed insulin pump technology to include tandem and OmniPod, patient would not like to make  changes at this time because she is going to be moving to [place] - BMP, Ma/Cr ratio normal   MEDICATIONS: Increase Tresiba 15 units daily  Change NovoLog 8/10/7 continue  Sensitivity factor 65     EDUCATION / INSTRUCTIONS: BG monitoring instructions: Patient is instructed to check her blood sugars 4 times a day with CGM caliberation. Call [facility] if: BG persistently < 70  I reviewed the Rule of 15 for the treatment of hypoglycemia in detail with the patient.     2) Diabetic complications:  Eye: Does not have known diabetic retinopathy.  Neuro/ Feet: Does  have known diabetic peripheral neuropathy. Renal: Patient does not have known baseline CKD. She is not on an ACEI/ARB at present.    3) Dyslipidemia :  - Lipid panel at goal     Medication Continue Atorvastatin 80 mg daily   F/U in 3 months    Signed electronically by: Lyndle Herrlich, MD  [facility]  [facility] [address] [place] [place], [place] [address] Phone: [phone] FAX: [phone]   CC: Carney Living, MD [address] [place] [address] Phone: [phone]  Fax: [phone]  Return to Endocrinology clinic as below: Future Appointments  Date Time Provider Department Center  09/09/2022 12:50 PM Samar Dass, Konrad Dolores, MD [facility] None  10/07/2022  9:45 AM Bonnita Levan, RD [facility] NDM  10/18/2022  9:15 AM McCaughan, Dia D, DPM [facility]  10/19/2022  8:00 AM [facility] MM 3 [facility] CE  10/20/2022  9:20 AM Genice Rouge, MD [facility] CPR  12/07/2022 10:30 AM Levert Feinstein, MD [facility] None  01/19/2023  9:45 AM Freddie Breech, DPM [facility]

## 2022-09-09 NOTE — Patient Instructions (Addendum)
-   Increase Tresiba 15 units daily  - Change Novolog to 8 units with Breakfast, 10 units with Lunch and 7 units with Supper Novolog correctional insulin: ADD extra units on insulin to your meal-time Novolog dose if your blood sugars are higher than 195. Use the scale below to help guide you:   Blood sugar before meal Number of units to inject  Less than 195 0 unit  196 - 260 1 units  261 - 325 2 units  326 - 390 3 units  391 - 455 4 units     HOW TO TREAT LOW BLOOD SUGARS (Blood sugar LESS THAN 70 MG/DL) Please follow the RULE OF 15 for the treatment of hypoglycemia treatment (when your (blood sugars are less than 70 mg/dL)   STEP 1: Take 15 grams of carbohydrates when your blood sugar is low, which includes:  3-4 GLUCOSE TABS  OR 3-4 OZ OF JUICE OR REGULAR SODA OR ONE TUBE OF GLUCOSE GEL    STEP 2: RECHECK blood sugar in 15 MINUTES STEP 3: If your blood sugar is still low at the 15 minute recheck --> then, go back to STEP 1 and treat AGAIN with another 15 grams of carbohydrates

## 2022-09-10 ENCOUNTER — Encounter: Payer: Self-pay | Admitting: Internal Medicine

## 2022-09-17 ENCOUNTER — Encounter: Payer: Self-pay | Admitting: Family Medicine

## 2022-10-04 ENCOUNTER — Telehealth: Payer: Self-pay | Admitting: Family Medicine

## 2022-10-04 NOTE — Telephone Encounter (Signed)
Patient dropped off [employer] forms to be completed. Last DOS was 08/17/22. Placed in [employer].

## 2022-10-04 NOTE — Telephone Encounter (Signed)
Placed in MDs box to be filled out. Deseree Blount, CMA  

## 2022-10-05 NOTE — Telephone Encounter (Signed)
Completed.

## 2022-10-06 ENCOUNTER — Other Ambulatory Visit (HOSPITAL_COMMUNITY): Payer: Self-pay

## 2022-10-06 ENCOUNTER — Telehealth: Payer: Self-pay

## 2022-10-06 NOTE — Telephone Encounter (Signed)
[employer] is waiting for authorization to be completed on Cover My Meds.  The key is [id number]. This is for the Inpen.  I attempted to pull it up but couldn't it wasn't showing me anything. Can we get this done asap.

## 2022-10-07 ENCOUNTER — Other Ambulatory Visit: Payer: Self-pay | Admitting: Internal Medicine

## 2022-10-07 ENCOUNTER — Encounter: Payer: Medicare Other | Attending: Internal Medicine | Admitting: Dietician

## 2022-10-07 DIAGNOSIS — E1042 Type 1 diabetes mellitus with diabetic polyneuropathy: Secondary | ICD-10-CM | POA: Diagnosis not present

## 2022-10-07 DIAGNOSIS — E1065 Type 1 diabetes mellitus with hyperglycemia: Secondary | ICD-10-CM

## 2022-10-07 NOTE — Telephone Encounter (Signed)
Form placed in mail per patient request.   Copy made for batch scanning.   Patient aware forms are complete.

## 2022-10-07 NOTE — Progress Notes (Signed)
Patient is here today alone.  She was last seen by this RD on 05/27/2022.  She states that she is going to move to [place] and may move August 5. She has not been exercise as she is getting ready to move.   Diabetes Self-Management Education  Visit Type: Follow-up  Appt. Start Time: 0945 Appt. End Time: 1015  10/07/2022  Joyce Brown, identified by name and date of birth, is a 72 y.o. female with a diagnosis of Diabetes:  .   ASSESSMENT I have provided her with a name of an RD in [place] as she states that medical transportation is not consistent and wishes for her medical staff to be closer to the [place] area.  She uses an adhesive overpatch over her Dexcom G7 to keep this in place better.  She has to wear this on her abdomen due to the arm placement being inconvenient due to the wheel chair use. She changed from a I:C ratio to a set insulin dose with meals.  In InPen did not allow for a different I:C for different meals.  She stays consistent with carbohydrate counting which keeps her more mindful to keep her carbs balanced and lower from meal to meal.  She is trying to more closely match her carbs and protein with each meal to get better balance. She is not cooking much currently due to the heat. Blood glucose control not as good as early July's report of 86 % in range at that time.  This may be due to less exercise and more stress.  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   History includes:Type 1 Diabetes since 2007, asthma, cerebellar ataxia, HTN, HLD, OSA on C-pap and sleep study Julyl 6.  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 Dexcom G-7 since 07/11/18.  Inpen  Medication includes Tresiba 14 units q HS and Novolog 8 units before breakfast,10 units before lunch and 7 units before dinner and 1 unit before a snack.  plus sliding scale (insulin sensitivity  factor:  110/65).   She is not aiming to give this 30 minutes prior to the meal which has improved her blood sugar control. A1C  8.2% 09/09/2022 increased from 7.1% 06/17/2022.  CGM Results from download: 01/29/22 05/27/2022 10/07/2022  % Time CGM active:   93 %   (Goal >70%) 93 93  Average glucose:   14 mg/dL for 14 days 161 mg/dL for 14 days 096 mg/dL for 14 days  Glucose management indicator:   unknown 7.7% 7.3  Time in range (70-180 mg/dL):   72 %   (Goal >04%) 52% 60  Time High (181-250 mg/dL):   16 %   (Goal < 54%) 28% 26  Time Very High (>250 mg/dL):    9 %   (Goal < 5%) 17% 10  Time Low (54-69 mg/dL):   <2 %   (Goal <0%) 1% 30  Time Very Low (<54 mg/dL):    %   (Goal <9%) 2% 1    Weight hx: 172 lbs 09/09/2022 183 lbs 09/28/2021 194 lbs 05/2021 per patient 185 lbs per patient 11/21/2020 173 lbs 05/15/2020 178 lbs 02/18/2020   Patient lives alone.  She uses a wheel chair.  She is doing her own grocery shopping.  She uses [employer] transpiration for medical appointments but is able to wheel herself to the grocery store.  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 She moved here from [place] last year to be closer to one of her sister's who lives in [place]. The pool at her apartment is outdoors.  Patient enjoys walking in the pool.  Uses the gym at her apartment complex when available. She last saw an RD in [place] about 11 months prior to my first visit.    Diabetes Self-Management Education - 10/07/22 1827       Visit Information   Visit Type Follow-up      Psychosocial Assessment   What is the hardest part about your diabetes right now, causing you the most concern, or is the most worrisome to you about your diabetes?   Taking/obtaining medications    Self-care barriers None    Self-management support Doctor's office;CDE visits    Other persons present Patient    Patient Concerns Nutrition/Meal planning    Special Needs None     Preferred Learning Style No preference indicated    Learning Readiness Ready    How often do you need to have someone help you when you read instructions, pamphlets, or other written materials from your doctor or pharmacy? 1 - Never      Pre-Education Assessment   Patient understands the diabetes disease and treatment process. Demonstrates understanding / competency    Patient understands incorporating nutritional management into lifestyle. Comprehends key points    Patient undertands incorporating physical activity into lifestyle. Demonstrates understanding / competency    Patient understands using medications safely. Demonstrates understanding / competency    Patient understands monitoring blood glucose, interpreting and using results Demonstrates understanding / competency    Patient understands prevention, detection, and treatment of acute complications. Demonstrates understanding / competency    Patient understands prevention, detection, and treatment of chronic complications. Demonstrates understanding / competency    Patient understands how to develop strategies to address psychosocial issues. Demonstrates understanding / competency    Patient understands how to develop strategies to promote health/change behavior. Comprehends key points      Complications   Last HgB A1C per patient/outside source 8.2 %   09/09/2022 increased from 7.1% 06/17/2022   How often do you check your blood sugar? > 4 times/day      Dietary Intake   Breakfast Multigrain bread, cottage cheese, apple butter or strawberry fruit spread, fresh cheeries    Lunch fresh cherries, cheese, hummus, lite rye wassa cracker    Dinner hummus, lite rye wassa cracker, less sugar lemon yogurt    Beverage(s) water      Activity / Exercise   Activity / Exercise Type ADL's      Patient Education   Previous Diabetes Education Yes (please comment)   05/27/2022   Healthy Eating Other (comment)   evaluated her food records and  provided feedback   Medications Reviewed patients medication for diabetes, action, purpose, timing of dose and side effects.    Monitoring Taught/evaluated CGM (comment)    Diabetes Stress and Support Identified and addressed patients feelings and concerns about diabetes;Worked with patient to identify barriers to care and solutions      Individualized Goals (developed by patient)   Nutrition General guidelines for healthy choices and portions discussed;Carb counting    Physical Activity 15 minutes per day;Exercise 3-5 times per week    Medications take my medication as prescribed    Monitoring  Consistenly use CGM    Problem Solving Addressing barriers to behavior change    Reducing Risk examine blood glucose patterns;do foot checks  daily;treat hypoglycemia with 15 grams of carbs if blood glucose less than 70mg /dL      Patient Self-Evaluation of Goals - Patient rates self as meeting previously set goals (% of time)   Nutrition >75% (most of the time)    Physical Activity 25 - 50% (sometimes)    Medications >75% (most of the time)    Monitoring >75% (most of the time)    Problem Solving and behavior change strategies  >75% (most of the time)    Reducing Risk (treating acute and chronic complications) >75% (most of the time)    Health Coping >75% (most of the time)      Post-Education Assessment   Patient understands the diabetes disease and treatment process. Demonstrates understanding / competency    Patient understands incorporating nutritional management into lifestyle. Comprehends key points    Patient undertands incorporating physical activity into lifestyle. Demonstrates understanding / competency    Patient understands using medications safely. Demonstrates understanding / competency    Patient understands monitoring blood glucose, interpreting and using results Demonstrates understanding / competency    Patient understands prevention, detection, and treatment of acute  complications. Demonstrates understanding / competency    Patient understands prevention, detection, and treatment of chronic complications. Demonstrates understanding / competency    Patient understands how to develop strategies to address psychosocial issues. Demonstrates understanding / competency    Patient understands how to develop strategies to promote health/change behavior. Comprehends key points      Outcomes   Expected Outcomes Demonstrated interest in learning. Expect positive outcomes    Future DMSE PRN    Program Status Completed      Subsequent Visit   Since your last visit have you experienced any weight changes? Loss             Individualized Plan for Diabetes Self-Management Training:   Learning Objective:  Patient will have a greater understanding of diabetes self-management. Patient education plan is to attend individual and/or group sessions per assessed needs and concerns.   Plan:   Patient Instructions      Expected Outcomes:  Demonstrated interest in learning. Expect positive outcomes  Education material provided:   If problems or questions, patient to contact team via:  Phone  Future DSME appointment: PRN

## 2022-10-18 ENCOUNTER — Ambulatory Visit: Payer: Medicare Other | Admitting: Podiatry

## 2022-10-19 ENCOUNTER — Ambulatory Visit: Payer: Medicare Other

## 2022-10-20 ENCOUNTER — Encounter: Payer: Medicare Other | Admitting: Physical Medicine and Rehabilitation

## 2022-11-04 ENCOUNTER — Ambulatory Visit: Payer: Medicare Other | Admitting: Podiatry

## 2022-11-04 DIAGNOSIS — Z789 Other specified health status: Secondary | ICD-10-CM | POA: Diagnosis not present

## 2022-11-04 DIAGNOSIS — Z7409 Other reduced mobility: Secondary | ICD-10-CM | POA: Diagnosis not present

## 2022-11-04 DIAGNOSIS — G119 Hereditary ataxia, unspecified: Secondary | ICD-10-CM | POA: Diagnosis not present

## 2022-11-04 NOTE — Progress Notes (Signed)
New patient visit   Patient: Joyce Brown   DOB: 17-Aug-1950   72 y.o. Female  MRN: [id number] Visit Date: 11/10/2022  Today's healthcare provider: Ronnald Ramp, MD   Chief Complaint  Patient presents with   New Patient (Initial Visit)   Subjective    Joyce Brown is a 72 y.o. female who presents today as a new patient to establish care.      Discussed the use of AI scribe software for clinical note transcription with the patient, who gave verbal consent to proceed.  History of Present Illness   The patient, a recent transplant to [facility], presents with a complex medical history, including cerebellar ataxia, type 1 diabetes, and a history of bariatric surgery. The patient's cerebellar ataxia, initially misdiagnosed as stiff person syndrome in 2007, has resulted in impaired balance and necessitates the use of a wheelchair for mobility. The patient also reports a history of type 1 diabetes since 2007, managed with Tresiba and NovoLog, and monitored with a continuous glucose monitor. The patient's diabetes management has been complicated by fluctuations in glucose levels, with recent A1c levels increasing from 7.1 to 8.2.  The patient underwent bariatric surgery in 2014, which resulted in significant weight loss from nearly 300 pounds to a current weight of 147 pounds. The patient reports successful maintenance of this weight loss.  The patient also reports a history of cataract surgery in both eyes, completed in December and January, respectively. The patient is currently managing dry eyes with omega-3 capsules and refresh drops.  The patient has a history of skin cancer, with a lesion on the forehead treated 10 years ago and a lesion on the left calf treated in December. The patient also reports a history of spinal stenosis in the lumbar and cervical regions, fibromyalgia, reflux, and burning mouth syndrome.  The patient's medications include mycophenolate  for cerebellar ataxia, Myrbetriq for overactive bladder, sertraline for mood related to fibromyalgia, tizanidine for neck and back spasms, and atorvastatin. The patient also uses a biotin dry mouth oral rinse and takes a bariatric fusion chewable multivitamin.  The patient reports a fall in early August, resulting in significant tailbone pain and continuous muscle spasms. The patient's pain level has since decreased from a 10 to a 2 with the use of acetaminophen and a coccyx cushion.  The patient has recently acquired a new wheelchair with a power assist feature, which is expected to reduce the physical strain of manual wheelchair use. The patient is currently practicing with this new device.  The patient has no history of smoking or drinking, attributing an active lifestyle playing sports to this. The patient has a 56 year old cat and recently moved to [place] from [place]. The patient reports improved respiratory health since moving to [place], with resolution of sleep apnea and asthma.          Past Medical History:  Diagnosis Date   Arthritis 1995   Have family history of it.   Asthma 1995   Has resolved   Cataract 2017   Family History of them   Cerebellar ataxia (HCC)    Depression 1996   Due to Fibromyalgia.   Diabetes mellitus without complication (HCC)    Diabetic neuropathy (HCC) 12/09/2020   Foot fracture 2022   Gait abnormality 11/07/2018   GERD (gastroesophageal reflux disease) 1985   No longer a problem since Gastric Bypass Surgery 09/25/2012.   Glaucoma Various   Family History of it.   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  12/2017 is probably Cer   Pulmonary embolism (HCC)    Sleep apnea    Spinal stenosis    Past Surgical History:  Procedure Laterality Date   ABDOMINAL HYSTERECTOMY     ABDOMINAL  HYSTERECTOMY  1997   Partial Hysterectomy   ADENOIDECTOMY     ANKLE SURGERY Left    BILATERAL CARPAL TUNNEL RELEASE     BREAST BIOPSY Left 2019   benign   COLON SURGERY  09/25/2012   Gastric Bypass Surgery   ELBOW SURGERY     EYE SURGERY  03/14/22 & 03/17/22   Cataract Surgery   GASTRIC BYPASS  2014   HAND NEUROPLASTY     radio denervation  05/21/2020   SMALL INTESTINE SURGERY  08/18/2013   Laparoscopic Procedure to Relieve Small Bowel Obstruction   TONSILLECTOMY     Family Status  Relation Name Status   Mother LEKETA NEUJAHR Deceased   Father Lucious Groves Deceased   Sister Gavin Pound Alive   Sister  Alive   Sister  Alive   Sister  Alive   PGF Harlow Mares (Not Specified)   Niece  Alive   Nephew  Alive   PGM Madilyn Hook (Not Specified)   Sister SHALICE FELDER (Not Specified)   Sister Severiano Gilbert (sister) (Not Specified)   Sister Severiano Gilbert (Not Specified)   Mat Uncle Ladoris Gene Vista Lawman (Not Specified)  No partnership data on file   Family History  Problem Relation Age of Onset   Cervical cancer Mother    Ovarian cancer Mother    Arthritis Mother    Cancer Mother    Dementia Father    Retinal detachment Father    Heart attack Father    Diabetes Father    Stroke Father    Hypertension Father    Alcohol abuse Father    Arthritis Father    Hearing loss Father    Hyperlipidemia Father    Ovarian cancer Sister    Arthritis Sister    Arthritis Sister    Arthritis Sister    Arthritis Sister    Glaucoma Paternal Grandfather    Arthritis Paternal Grandfather    Depression Paternal Grandfather    Hearing loss Paternal Grandfather    Diabetes Paternal Grandmother    Arthritis Sister    Arthritis Sister    Cancer Sister    Diabetes Maternal Uncle    Vision loss Maternal Uncle    Social History   Socioeconomic History   Marital status: Single    Spouse name: Not on file   Number of children: 0   Years of education:  16   Highest education level: Bachelor's degree (e.g., BA, AB, BS)  Occupational History   Occupation: Retired  Tobacco Use   Smoking status: Never    Passive exposure: Never   Smokeless tobacco: Never  Vaping Use   Vaping status: Never Used  Substance and Sexual Activity   Alcohol use: Never   Drug use: Never   Sexual activity: Not Currently  Other Topics Concern   Not on file  Social History Narrative   Patient lives alone in [place].    Patient has 1 cat- Monica- Madaline Guthrie passed.   Patient has 4 sisters- [place], [place], [place], and [place].   Patient  has been walking around her complex for exercise using wheelchair.    Social Determinants of Health   Financial Resource Strain: Medium Risk (08/13/2022)   Overall Financial Resource Strain (CARDIA)    Difficulty of Paying Living Expenses: Somewhat hard  Food Insecurity: No Food Insecurity (08/13/2022)   Hunger Vital Sign    Worried About Running Out of Food in the Last Year: Never true    Ran Out of Food in the Last Year: Never true  Transportation Needs: No Transportation Needs (08/13/2022)   PRAPARE - Administrator, Civil Service (Medical): No    Lack of Transportation (Non-Medical): No  Physical Activity: Insufficiently Active (08/13/2022)   Exercise Vital Sign    Days of Exercise per Week: 3 days    Minutes of Exercise per Session: 40 min  Stress: No Stress Concern Present (08/13/2022)   [employer] of Occupational Health - Occupational Stress Questionnaire    Feeling of Stress : Only a little  Social Connections: Socially Isolated (08/13/2022)   Social Connection and Isolation Panel [NHANES]    Frequency of Communication with Friends and Family: More than three times a week    Frequency of Social Gatherings with Friends and Family: Once a week    Attends Religious Services: Never    Database administrator or Organizations: No    Attends Banker Meetings: Never    Marital Status:  Never married    Outpatient Medications Prior to Visit  Medication Sig   Acetaminophen 500 MG capsule Take 500 mg by mouth every 6 (six) hours as needed for moderate pain.   antiseptic oral rinse (BIOTENE) LIQD 15 mLs by Mouth Rinse route in the morning, at noon, in the evening, and at bedtime.   atorvastatin (LIPITOR) 80 MG tablet Take 1 tablet (80 mg total) by mouth daily.   BD INSULIN SYRINGE U/F 1/2UNIT 31G X 5/16" 0.3 ML MISC USE 3 TIMES A DAY   Continuous Blood Gluc Sensor (DEXCOM G7 SENSOR) MISC by Does not apply route.   docusate sodium (COLACE) 100 MG capsule Take 100 mg by mouth daily as needed for mild constipation.   estradiol (ESTRACE) 0.1 MG/GM vaginal cream Place 1 Applicatorful vaginally 2 (two) times a week.   gabapentin (NEURONTIN) 100 MG capsule Take 1 capsule (100 mg total) by mouth 3 (three) times daily.   Glucagon (GVOKE HYPOPEN 1-PACK) 1 MG/0.2ML SOAJ Inject 1 mg into the skin as directed.   glucose blood test strip ASCENSIA CONTOUR NEXT ONE: Use 1 strip to check blood sugar daily   insulin aspart (NOVOLOG PENFILL) cartridge USE MAX OF DAILY DOSE OF 60 UNITS PER CORRECTION SCALE AS DIRECTED   insulin degludec (TRESIBA FLEXTOUCH) 100 UNIT/ML FlexTouch Pen Inject 14 Units into the skin daily.   Insulin Pen Needle (DROPLET PEN NEEDLES) 31G X 5 MM MISC USE FOUR TIMES A DAY AS DIRECTED BY YOUR DOCTOR   Microlet Lancets MISC 3 (three) times daily.   mycophenolate (CELLCEPT) 500 MG tablet Take 2 tablets (1,000 mg total) by mouth 2 (two) times daily.   Omega 3 1200 MG CAPS Take by mouth. One daily   sertraline (ZOLOFT) 100 MG tablet Take 1 tablet (100 mg total) by mouth daily.   tiZANidine (ZANAFLEX) 2 MG tablet TAKE 1 TO 2 TABLETS BY MOUTH AT BEDTIME AS NEEDED FOR MUSCLE SPASMS   GEMTESA 75 MG TABS Take 75 mg by mouth at bedtime.   No facility-administered medications prior to visit.  No Known Allergies  Immunization History  Administered Date(s) Administered    COVID-19, mRNA, vaccine(Comirnaty)12 years and older 01/01/2022   Fluad Quad(high Dose 65+) 11/20/2019, 12/16/2020   Fluad Trivalent(High Dose 65+) 11/10/2022   Influenza, High Dose Seasonal PF 01/06/2017   Influenza, Seasonal, Injecte, Preservative Fre 12/15/2007, 12/16/2008, 12/15/2009, 12/18/2010, 12/20/2011, 01/04/2013   Influenza,inj,Quad PF,6+ Mos 12/28/2013, 01/02/2015, 01/01/2016, 12/06/2017, 11/15/2018   Influenza,trivalent, recombinat, inj, PF 01/06/2017   Influenza-Unspecified 12/15/2007, 12/16/2008, 12/15/2009, 12/18/2010, 12/20/2011, 01/04/2013, 12/14/2021   PFIZER Comirnaty(Gray Top)Covid-19 Tri-Sucrose Vaccine 07/01/2020   PFIZER(Purple Top)SARS-COV-2 Vaccination 04/29/2019, 05/22/2019, 01/23/2020   PNEUMOCOCCAL CONJUGATE-20 12/31/2021   Pneumococcal Conjugate-13 03/20/2018   Pneumococcal Polysaccharide-23 02/12/2005, 07/22/2016   Td 02/17/2007   Td (Adult),5 Lf Tetanus Toxid, Preservative Free 02/17/2007   Tdap 11/23/2013, 07/25/2019   Unspecified SARS-COV-2 Vaccination 12/24/2020, 01/01/2022   Zoster Recombinant(Shingrix) 07/21/2017, 11/04/2017    Health Maintenance  Topic Date Due   COVID-19 Vaccine (7 - 2023-24 season) 02/26/2022   OPHTHALMOLOGY EXAM  06/02/2022   HEMOGLOBIN A1C  03/11/2023   Colonoscopy  03/16/2023   FOOT EXAM  03/27/2023   MAMMOGRAM  10/15/2023   DTaP/Tdap/Td (5 - Td or Tdap) 07/24/2029   Pneumonia Vaccine 39+ Years old  Completed   INFLUENZA VACCINE  Completed   DEXA SCAN  Completed   Zoster Vaccines- Shingrix  Completed   HPV VACCINES  Aged Out    Patient Care Team: Ronnald Ramp, MD as PCP - General (Family Medicine) Darlyn Read, MD as Referring Physician (Neurology) Shamleffer, Konrad Dolores, MD as Consulting Physician (Endocrinology)  Review of Systems      Objective    BP (!) 124/53 (BP Location: Right Arm, Patient Position: Sitting, Cuff Size: Normal)   Pulse 85   Ht 5\' 10"  (1.778 m)   Wt 147 lb  (66.7 kg)   SpO2 100%   BMI 21.09 kg/m      Depression Screen    08/17/2022    8:42 AM 07/19/2022    9:19 AM 12/31/2021    2:33 PM 09/23/2021    8:54 AM  PHQ 2/9 Scores  PHQ - 2 Score 2 0 0 0  PHQ- 9 Score 5      No results found for any visits on 11/10/22.   Physical Exam Vitals reviewed.  Constitutional:      General: She is not in acute distress.    Appearance: Normal appearance. She is not ill-appearing, toxic-appearing or diaphoretic.     Comments: Female in no acute distress seated in wheelchair  Eyes:     Conjunctiva/sclera: Conjunctivae normal.  Cardiovascular:     Rate and Rhythm: Normal rate and regular rhythm.     Pulses: Normal pulses.     Heart sounds: Normal heart sounds. No murmur heard.    No friction rub. No gallop.  Pulmonary:     Effort: Pulmonary effort is normal. No respiratory distress.     Breath sounds: Normal breath sounds. No stridor. No wheezing, rhonchi or rales.  Abdominal:     General: Bowel sounds are normal. There is no distension.     Palpations: Abdomen is soft.     Tenderness: There is no abdominal tenderness.  Musculoskeletal:     Right lower leg: No edema.     Left lower leg: No edema.  Skin:    Findings: No erythema or rash.  Neurological:     Mental Status: She is alert and oriented to person, place, and time.     Comments: 4/5 grip  strength in bilateral lateral hands, 5/5 strength in bilateral lower extremities, pupils are equal and reactive bilaterally        Assessment & Plan      Problem List Items Addressed This Visit     Cerebellar ataxia (HCC)    Cerebellar Ataxia Diagnosed in 2019, previously misdiagnosed as stiff person syndrome. Independent walking is challenging due to balance issues. -Referral to neurology for further recommendations -Continue current medications including mycophenolate 1000 mg twice daily      Relevant Orders   Ambulatory referral to Neurology   Chronic midline low back pain with  bilateral sciatica   Relevant Orders   Ambulatory referral to Neurosurgery   Degeneration of lumbar intervertebral disc   Relevant Orders   Ambulatory referral to Neurosurgery   Diabetic neuropathy [facility])   Relevant Orders   Ambulatory referral to Neurology   Referral to Nutrition and Diabetes Services   Healthcare maintenance   Hyperparathyroidism [facility])   Relevant Orders   Ambulatory referral to Endocrinology   Type 1 diabetes mellitus with diabetic polyneuropathy (HCC)    Diagnosed in 2007, recent A1C of 8.2. Patient uses a continuous glucose monitor due to previous issues with hypoglycemia. Currently on NovoLog (mealtime dosing) and Tresiba 15 units daily. -Referral to endocrinology for further management. -Continue current insulin regimen. -Referral to diabetes nutrition -Continue CGM -Referral submitted for podiatry diabetes foot exam      Relevant Orders   Ambulatory referral to Endocrinology   Referral to Nutrition and Diabetes Services   Ambulatory referral to Podiatry   Wheelchair dependence   Relevant Orders   Ambulatory referral to Neurosurgery   Other Visit Diagnoses     Establishing care with new doctor, encounter for    -  Primary   Influenza vaccine needed       Relevant Orders   Flu Vaccine Trivalent High Dose (Fluad) (Completed)           HLD Continue chronic atorvastatin 80mg  daily     Post-Bariatric Surgery Surgery performed in 2014, patient has maintained weight loss successfully. Annual follow-up with bariatric surgeon. -Continue annual follow-up with bariatric surgeon.  Spinal Stenosis (Lumbar and Cervical) Patient experiences neck and back spasms, currently managed with tizanidine 2 mg at bedtime. -Referral to neurosurgery for further management.  Dry Eyes Managed with omega 3 capsules 1200 mg daily and refresh drops. -Continue current regimen.  Fibromyalgia Managed with sertraline 100 mg daily. -Continue sertraline.  Overactive  Bladder Managed with Myrbetriq 100 mg before bed. -Continue Myrbetriq.  General Health Maintenance -Administer flu vaccine for the 2024-2025 season. -Annual wellness visit scheduled for 01/02/2023.          Return in about 8 weeks (around 01/02/2023) for AWV.      Ronnald Ramp, MD  [facility] [phone] (phone) [phone] (fax)  [facility]

## 2022-11-10 ENCOUNTER — Encounter: Payer: Self-pay | Admitting: Family Medicine

## 2022-11-10 ENCOUNTER — Ambulatory Visit (INDEPENDENT_AMBULATORY_CARE_PROVIDER_SITE_OTHER): Payer: Medicare Other | Admitting: Family Medicine

## 2022-11-10 ENCOUNTER — Ambulatory Visit: Payer: Medicare Other | Admitting: Internal Medicine

## 2022-11-10 VITALS — BP 124/53 | HR 85 | Ht 70.0 in | Wt 147.0 lb

## 2022-11-10 DIAGNOSIS — E1042 Type 1 diabetes mellitus with diabetic polyneuropathy: Secondary | ICD-10-CM | POA: Diagnosis not present

## 2022-11-10 DIAGNOSIS — M5441 Lumbago with sciatica, right side: Secondary | ICD-10-CM

## 2022-11-10 DIAGNOSIS — G119 Hereditary ataxia, unspecified: Secondary | ICD-10-CM | POA: Diagnosis not present

## 2022-11-10 DIAGNOSIS — G8929 Other chronic pain: Secondary | ICD-10-CM | POA: Diagnosis not present

## 2022-11-10 DIAGNOSIS — M5136 Other intervertebral disc degeneration, lumbar region: Secondary | ICD-10-CM

## 2022-11-10 DIAGNOSIS — Z993 Dependence on wheelchair: Secondary | ICD-10-CM

## 2022-11-10 DIAGNOSIS — Z0000 Encounter for general adult medical examination without abnormal findings: Secondary | ICD-10-CM

## 2022-11-10 DIAGNOSIS — Z23 Encounter for immunization: Secondary | ICD-10-CM

## 2022-11-10 DIAGNOSIS — Z7689 Persons encountering health services in other specified circumstances: Secondary | ICD-10-CM

## 2022-11-10 DIAGNOSIS — E213 Hyperparathyroidism, unspecified: Secondary | ICD-10-CM | POA: Diagnosis not present

## 2022-11-10 DIAGNOSIS — M5442 Lumbago with sciatica, left side: Secondary | ICD-10-CM

## 2022-11-10 NOTE — Assessment & Plan Note (Signed)
Diagnosed in 2007, recent A1C of 8.2. Patient uses a continuous glucose monitor due to previous issues with hypoglycemia. Currently on NovoLog (mealtime dosing) and Tresiba 15 units daily. -Referral to endocrinology for further management. -Continue current insulin regimen. -Referral to diabetes nutrition -Continue CGM -Referral submitted for podiatry diabetes foot exam

## 2022-11-10 NOTE — Assessment & Plan Note (Signed)
Cerebellar Ataxia Diagnosed in 2019, previously misdiagnosed as stiff person syndrome. Independent walking is challenging due to balance issues. -Referral to neurology for further recommendations -Continue current medications including mycophenolate 1000 mg twice daily

## 2022-11-10 NOTE — Patient Instructions (Signed)
VISIT SUMMARY:  During your recent visit, we discussed your complex medical history, including cerebellar ataxia, type 1 diabetes, and a history of bariatric surgery. We also discussed your recent fall and the acquisition of a new wheelchair. We reviewed your current medications and made plans for further management of your conditions.  YOUR PLAN:  -CEREBELLAR ATAXIA: This is a condition that affects your balance and coordination. We have referred you to a neurologist for further management.  -TYPE 1 DIABETES: This is a condition where your body does not produce insulin. We have referred you to an endocrinologist and a diabetes nutritionist for further management. Please continue your current insulin regimen.  -POST-BARIATRIC SURGERY: You had surgery to help with weight loss. Please continue your annual follow-ups with your bariatric surgeon.  -SPINAL STENOSIS (LUMBAR AND CERVICAL): This is a condition where the spaces within your spine are narrowing. We have referred you to a neurosurgeon for further management.  -DRY EYES: This is a condition where your eyes do not produce enough tears. Please continue your current regimen of omega 3 capsules and refresh drops.  -FIBROMYALGIA: This is a disorder characterized by widespread musculoskeletal pain. Please continue taking sertraline.  -OVERACTIVE BLADDER: This is a condition where you have an urgent and frequent need to urinate. Please continue taking Myrbetriq.  INSTRUCTIONS:  For your general health maintenance, we have administered the flu vaccine for the 2024-2025 season and ordered a mammogram. We have also referred you to podiatry. Your annual wellness visit is scheduled for January 02, 2023.

## 2022-11-11 ENCOUNTER — Encounter: Payer: Self-pay | Admitting: Family Medicine

## 2022-11-11 ENCOUNTER — Other Ambulatory Visit: Payer: Self-pay | Admitting: Family Medicine

## 2022-11-11 DIAGNOSIS — M47816 Spondylosis without myelopathy or radiculopathy, lumbar region: Secondary | ICD-10-CM

## 2022-11-11 DIAGNOSIS — H903 Sensorineural hearing loss, bilateral: Secondary | ICD-10-CM

## 2022-11-11 DIAGNOSIS — G8929 Other chronic pain: Secondary | ICD-10-CM

## 2022-11-11 DIAGNOSIS — M5136 Other intervertebral disc degeneration, lumbar region: Secondary | ICD-10-CM

## 2022-11-11 DIAGNOSIS — M4800 Spinal stenosis, site unspecified: Secondary | ICD-10-CM

## 2022-11-25 ENCOUNTER — Ambulatory Visit (INDEPENDENT_AMBULATORY_CARE_PROVIDER_SITE_OTHER): Payer: Medicare Other | Admitting: Podiatry

## 2022-11-25 DIAGNOSIS — B351 Tinea unguium: Secondary | ICD-10-CM

## 2022-11-25 DIAGNOSIS — M79674 Pain in right toe(s): Secondary | ICD-10-CM | POA: Diagnosis not present

## 2022-11-25 DIAGNOSIS — M79675 Pain in left toe(s): Secondary | ICD-10-CM | POA: Diagnosis not present

## 2022-11-25 DIAGNOSIS — E1042 Type 1 diabetes mellitus with diabetic polyneuropathy: Secondary | ICD-10-CM | POA: Diagnosis not present

## 2022-11-26 ENCOUNTER — Encounter: Payer: Self-pay | Admitting: Family Medicine

## 2022-12-02 ENCOUNTER — Encounter: Payer: Self-pay | Admitting: Podiatry

## 2022-12-02 NOTE — Progress Notes (Signed)
Subjective:  Patient ID: Joyce Brown, female    DOB: Jun 28, 1950,  MRN: [id number]  Joyce Brown presents to clinic today for: at risk foot care with history of diabetic neuropathy  Chief Complaint  Patient presents with   Nail Problem    DFC,Referring Provider Carney Living, MD,lov:08/24,A1C:8.2,BS:monitor only reading high today       PCP is Simmons-Robinson, Tawanna Cooler, MD.  No Known Allergies  Review of Systems: Negative except as noted in the HPI.  Objective: No changes noted in today's physical examination. There were no vitals filed for this visit.  Joyce Brown is a pleasant 72 y.o. female in NAD. AAO x 3.  Vascular Examination: Capillary refill time <3 seconds b/l LE. Palpable pedal pulses b/l LE. Digital hair present b/l. No pedal edema b/l. Skin temperature gradient WNL b/l. No varicosities b/l. Marland Kitchen  Dermatological Examination: Pedal skin with normal turgor, texture and tone b/l. No open wounds. No interdigital macerations b/l. Toenails 1-5 b/l thickened, discolored, dystrophic with subungual debris. There is pain on palpation to dorsal aspect of nailplates. No corns, calluses nor porokeratotic lesions noted..  Neurological Examination: Protective sensation intact with 10 gram monofilament b/l LE. Vibratory sensation intact b/l LE. Pt has subjective symptoms of neuropathy.  Musculoskeletal Examination: Muscle strength 5/5 to all lower extremity muscle groups bilaterally. Hammertoe deformity noted 2-5 b/l. Wearing appropriate fitting shoe gear. Utilizes wheelchair for mobility assistance.     Latest Ref Rng & Units 09/09/2022   12:17 PM 06/17/2022    8:08 AM 02/11/2022    8:12 AM  Hemoglobin A1C  Hemoglobin-A1c 4.0 - 5.6 % 8.2  7.1  6.8    Assessment/Plan: 1. Pain due to onychomycosis of toenails of both feet   2. Diabetic polyneuropathy associated with type 1 diabetes mellitus (HCC)     Patient was evaluated and treated. All patient's  and/or POA's questions/concerns addressed on today's visit. Mycotic toenails 1-5 debrided in length and girth without incident. Continue soft, supportive shoe gear daily. Report any pedal injuries to medical professional. Call office if there are any quesitons/concerns. -Patient/POA to call should there be question/concern in the interim.   Return in about 3 months (around 02/24/2023).  Freddie Breech, DPM

## 2022-12-07 ENCOUNTER — Ambulatory Visit: Payer: Medicare Other | Admitting: Neurology

## 2022-12-09 ENCOUNTER — Ambulatory Visit
Payer: Medicare Other | Attending: Student in an Organized Health Care Education/Training Program | Admitting: Student in an Organized Health Care Education/Training Program

## 2022-12-09 ENCOUNTER — Encounter: Payer: Self-pay | Admitting: Student in an Organized Health Care Education/Training Program

## 2022-12-09 VITALS — BP 135/73 | HR 73 | Temp 97.7°F | Resp 16 | Ht 70.0 in | Wt 147.0 lb

## 2022-12-09 DIAGNOSIS — M542 Cervicalgia: Secondary | ICD-10-CM | POA: Insufficient documentation

## 2022-12-09 DIAGNOSIS — M180 Bilateral primary osteoarthritis of first carpometacarpal joints: Secondary | ICD-10-CM | POA: Insufficient documentation

## 2022-12-09 DIAGNOSIS — M47816 Spondylosis without myelopathy or radiculopathy, lumbar region: Secondary | ICD-10-CM | POA: Diagnosis not present

## 2022-12-09 NOTE — Progress Notes (Signed)
Safety precautions to be maintained throughout the outpatient stay will include: orient to surroundings, keep bed in low position, maintain call bell within reach at all times, provide assistance with transfer out of bed and ambulation.  

## 2022-12-09 NOTE — Progress Notes (Signed)
Patient: Joyce Brown  Service Category: E/M  Provider: Edward Jolly, MD  DOB: Mar 08, 1951  DOS: 12/09/2022  Referring Provider: Brett Albino*  MRN: [id number]  Setting: Ambulatory outpatient  PCP: Ronnald Ramp, MD  Type: New Patient  Specialty: Interventional Pain Management    Location: Office  Delivery: Face-to-face     Primary Reason(s) for Visit: Encounter for initial evaluation of one or more chronic problems (new to examiner) potentially causing chronic pain, and posing a threat to normal musculoskeletal function. (Level of risk: High) CC: Neck Pain and Back Pain  HPI  Joyce Brown is a 72 y.o. year old, female patient, who comes for the first time to our practice referred by [facility] for our initial evaluation of her chronic pain. She has Cerebellar ataxia (HCC); Type I diabetes mellitus with complication, uncontrolled; Fibromyalgia; History of gastric bypass; Spinal stenosis; Breast calcifications; Pain of left hip joint; Chronic midline low back pain with bilateral sciatica; Gait abnormality; FH: ovarian cancer in first degree relative; Tinnitus, bilateral; End of life care; Sensorineural hearing loss (SNHL) of both ears; Lumbar spondylosis; Dyslipidemia; Myofascial pain; Wheelchair dependence; Adjustment disorder with mixed anxiety and depressed mood; Carpal tunnel syndrome; Degeneration of lumbar intervertebral disc; History of vaginal hysterectomy; Hyperparathyroidism (HCC); Increased PTH level; Intestinal malabsorption following gastrectomy; Lichen planus; Mild persistent asthma without complication; Non-organic enuresis; Obstructive sleep apnea on CPAP; Osteoarthritis of hand; Pain in joint involving ankle and foot; Pain of lumbar facet joint; Serum albumin decreased; Stiff person syndrome; Urge incontinence of urine; Vitamin B6 deficiency; Diabetic neuropathy (HCC); Type 1 diabetes mellitus with diabetic polyneuropathy (HCC); Squamous cell  carcinoma in situ; Bilateral tennis elbow; H/O bariatric surgery; Burning mouth syndrome; Facial rash; Rectal prolapse; Healthcare maintenance; Cervicalgia; and Arthritis of carpometacarpal (CMC) joint of both thumbs on their problem list. Today she comes in for evaluation of her Neck Pain and Back Pain  Pain Assessment: Location: Posterior, Right, Left Neck Radiating: Denies Onset: More than a month ago Duration: Chronic pain Quality: Constant, Sharp, Spasm, Cramping, Headache, Stabbing Severity: 6 /10 (subjective, self-reported pain score)  Effect on ADL: Limits ADLs Timing: Constant Modifying factors: TPI, tylenol, gabapentin, and tizanidine BP: 135/73  HR: 73  Onset and Duration: Gradual and Present longer than 3 months Cause of pain:  Arthritis,muscle spasms,fibromyalgia Severity: No change since onset, NAS-11 at its worse: 8/10, NAS-11 at its best: 3/10, NAS-11 now: 4/10, and NAS-11 on the average: 4/10 Timing: Not influenced by the time of the day, During activity or exercise, and After a period of immobility Aggravating Factors: Prolonged sitting, Prolonged standing, and Twisting Alleviating Factors: Acupuncture, Stretching, Medications, Sitting, Warm showers or baths, and Walking Associated Problems: Night-time cramps, Depression, Dizziness, Inability to concentrate, Spasms, Weakness, and Pain that wakes patient up Quality of Pain: Cramping, Sharp, Stabbing, and Superficial Previous Examinations or Tests: CT scan, MRI scan, X-rays, Neurological evaluation, and Orthopedic evaluation Previous Treatments: Physical Therapy, Pool exercises, Stretching exercises, TENS, Traction, and Trigger point injections  Joyce Brown is being evaluated for possible interventional pain management therapies for the treatment of her chronic pain.   Joyce Brown is a pleasant 72 year old female who has recently moved to [place] from [place].  She is hoping to have her pain management care  transferred here.  Of note she was seeing physical medicine and rehab in [place].  There she would get yearly radiofrequency ablations.  Her previous RFA was 01/06/2022 for the right side at L3, L4, L5 followed by left L3, L4, L5 RFA on  MR CERVICAL SPINE WO CONTRAST  Narrative [facility]  NEUROIMAGING REPORT   STUDY DATE: 06/09/21 PATIENT NAME: Joyce Brown DOB: [birth year] MRN: [id number]  ORDERING CLINICIAN: Levert Feinstein, MD CLINICAL HISTORY: 72 year old female with ataxia.  EXAM: MR CERVICAL SPINE WO CONTRAST TECHNIQUE: MRI of the cervical spine was obtained utilizing multiplanar, multiecho pulse sequences. CONTRAST: none COMPARISON: none  IMAGING SITE: [facility] AT [address] [place]    FINDINGS:  On sagittal views the vertebral bodies have normal height and alignment.  Degenerative spondylosis and disc bulging from C3-4 down to C6-7.  Degenerative marrow edema at C6 and C7 vertebral bodies anteriorly with degenerative endplate disease.  The spinal cord is normal in size and appearance. The posterior fossa, pituitary gland and paraspinal soft tissues are unremarkable.  On axial views there is no spinal stenosis or foraminal narrowing except: - C6-7 disc bulging and facet hypertrophy with mild bilateral foraminal stenosis.  Limited views of the soft tissues of the head and neck are unremarkable.  Impression MRI cervical spine (without) demonstrating: - At C6-7 disc bulging and facet hypertrophy with mild bilateral foraminal stenosis. Degenerative endplate disease with adjacent marrow edema at C6-7 levels. - Mild multilevel degenerative spondylosis, disc bulging from C3-4 down to C6-7. - No intrinsic or compressive spinal cord lesions.   INTERPRETING PHYSICIAN: Suanne Marker, MD Certified in Neurology, Neurophysiology and Neuroimaging  [facility] [address] [place], [place] [address] [phone]   MR Lumbar Spine w/o contrast  Narrative CLINICAL DATA:  Low back pain, > 6 wks  EXAM: MRI LUMBAR SPINE WITHOUT CONTRAST  TECHNIQUE: Multiplanar, multisequence MR imaging of the lumbar spine was performed. No intravenous contrast was administered.  COMPARISON:  03/05/2020 and prior.  FINDINGS: Segmentation:  Standard.  Alignment: Lumbar dextrocurvature. Slight reversal of lordosis. Minimal grade 1 L4-5 retrolisthesis. Minimal grade 1 L2-3, L3-4 anterolisthesis.  Vertebrae: Vertebral body heights are preserved. Multilevel Modic type 1/2 endplate degenerative changes. No focal osseous lesion.  Conus medullaris and cauda equina: Conus extends to the L1 level. Conus and cauda equina appear normal.  Disc  levels: Multilevel desiccation and Schmorl's node formation.  L1-2: Disc bulge with superimposed left foraminal protrusion. Bilateral facet hypertrophy. Patent right neural foramen. Mild spinal canal and severe left neural foraminal narrowing.  L2-3: Disc bulge with superimposed central and left foraminal protrusions. Bilateral facet hypertrophy. Patent right neural foramen. Mild spinal canal and left neural foraminal narrowing.  L3-4: Disc bulge with shallow central and bilateral foraminal protrusions. Small left extraforaminal protrusion. Bilateral facet hypertrophy. Patent spinal canal. Mild bilateral neural foraminal narrowing.  L4-5: Disc bulge with superimposed right foraminal and shallow right paracentral protrusions abutting the descending right L5 nerve root. Bilateral facet hypertrophy. Mild spinal canal, moderate right and mild left neural foraminal narrowing.  L5-S1: Disc bulge with superimposed right foraminal protrusion. Bilateral facet hypertrophy. Patent spinal canal. Mild bilateral neural foraminal narrowing.  Paraspinal and other soft tissues: Small right renal cysts.  IMPRESSION: Multilevel spondylosis. Lumbar dextrocurvature and slight reversal of lordosis.  Severe left L1-2 and moderate right L4-5 neural foraminal narrowing.  Mild spinal canal narrowing at the L1-3, L4-5 levels.  Mild left L2-3, bilateral L3-4, left L4-5 and bilateral L5-S1 neural foraminal narrowing.   Electronically Signed By: Stana Bunting M.D. On: 04/03/2020 09:19   MR Hip Left w/o contrast  Narrative CLINICAL DATA:  Chronic left hip pain.  EXAM: MR OF THE LEFT HIP WITHOUT  MR CERVICAL SPINE WO CONTRAST  Narrative [facility]  NEUROIMAGING REPORT   STUDY DATE: 06/09/21 PATIENT NAME: Joyce Brown DOB: [birth year] MRN: [id number]  ORDERING CLINICIAN: Levert Feinstein, MD CLINICAL HISTORY: 72 year old female with ataxia.  EXAM: MR CERVICAL SPINE WO CONTRAST TECHNIQUE: MRI of the cervical spine was obtained utilizing multiplanar, multiecho pulse sequences. CONTRAST: none COMPARISON: none  IMAGING SITE: [facility] AT [address] [place]    FINDINGS:  On sagittal views the vertebral bodies have normal height and alignment.  Degenerative spondylosis and disc bulging from C3-4 down to C6-7.  Degenerative marrow edema at C6 and C7 vertebral bodies anteriorly with degenerative endplate disease.  The spinal cord is normal in size and appearance. The posterior fossa, pituitary gland and paraspinal soft tissues are unremarkable.  On axial views there is no spinal stenosis or foraminal narrowing except: - C6-7 disc bulging and facet hypertrophy with mild bilateral foraminal stenosis.  Limited views of the soft tissues of the head and neck are unremarkable.  Impression MRI cervical spine (without) demonstrating: - At C6-7 disc bulging and facet hypertrophy with mild bilateral foraminal stenosis. Degenerative endplate disease with adjacent marrow edema at C6-7 levels. - Mild multilevel degenerative spondylosis, disc bulging from C3-4 down to C6-7. - No intrinsic or compressive spinal cord lesions.   INTERPRETING PHYSICIAN: Suanne Marker, MD Certified in Neurology, Neurophysiology and Neuroimaging  [facility] [address] [place], [place] [address] [phone]   MR Lumbar Spine w/o contrast  Narrative CLINICAL DATA:  Low back pain, > 6 wks  EXAM: MRI LUMBAR SPINE WITHOUT CONTRAST  TECHNIQUE: Multiplanar, multisequence MR imaging of the lumbar spine was performed. No intravenous contrast was administered.  COMPARISON:  03/05/2020 and prior.  FINDINGS: Segmentation:  Standard.  Alignment: Lumbar dextrocurvature. Slight reversal of lordosis. Minimal grade 1 L4-5 retrolisthesis. Minimal grade 1 L2-3, L3-4 anterolisthesis.  Vertebrae: Vertebral body heights are preserved. Multilevel Modic type 1/2 endplate degenerative changes. No focal osseous lesion.  Conus medullaris and cauda equina: Conus extends to the L1 level. Conus and cauda equina appear normal.  Disc  levels: Multilevel desiccation and Schmorl's node formation.  L1-2: Disc bulge with superimposed left foraminal protrusion. Bilateral facet hypertrophy. Patent right neural foramen. Mild spinal canal and severe left neural foraminal narrowing.  L2-3: Disc bulge with superimposed central and left foraminal protrusions. Bilateral facet hypertrophy. Patent right neural foramen. Mild spinal canal and left neural foraminal narrowing.  L3-4: Disc bulge with shallow central and bilateral foraminal protrusions. Small left extraforaminal protrusion. Bilateral facet hypertrophy. Patent spinal canal. Mild bilateral neural foraminal narrowing.  L4-5: Disc bulge with superimposed right foraminal and shallow right paracentral protrusions abutting the descending right L5 nerve root. Bilateral facet hypertrophy. Mild spinal canal, moderate right and mild left neural foraminal narrowing.  L5-S1: Disc bulge with superimposed right foraminal protrusion. Bilateral facet hypertrophy. Patent spinal canal. Mild bilateral neural foraminal narrowing.  Paraspinal and other soft tissues: Small right renal cysts.  IMPRESSION: Multilevel spondylosis. Lumbar dextrocurvature and slight reversal of lordosis.  Severe left L1-2 and moderate right L4-5 neural foraminal narrowing.  Mild spinal canal narrowing at the L1-3, L4-5 levels.  Mild left L2-3, bilateral L3-4, left L4-5 and bilateral L5-S1 neural foraminal narrowing.   Electronically Signed By: Stana Bunting M.D. On: 04/03/2020 09:19   MR Hip Left w/o contrast  Narrative CLINICAL DATA:  Chronic left hip pain.  EXAM: MR OF THE LEFT HIP WITHOUT  MR CERVICAL SPINE WO CONTRAST  Narrative [facility]  NEUROIMAGING REPORT   STUDY DATE: 06/09/21 PATIENT NAME: Joyce Brown DOB: [birth year] MRN: [id number]  ORDERING CLINICIAN: Levert Feinstein, MD CLINICAL HISTORY: 72 year old female with ataxia.  EXAM: MR CERVICAL SPINE WO CONTRAST TECHNIQUE: MRI of the cervical spine was obtained utilizing multiplanar, multiecho pulse sequences. CONTRAST: none COMPARISON: none  IMAGING SITE: [facility] AT [address] [place]    FINDINGS:  On sagittal views the vertebral bodies have normal height and alignment.  Degenerative spondylosis and disc bulging from C3-4 down to C6-7.  Degenerative marrow edema at C6 and C7 vertebral bodies anteriorly with degenerative endplate disease.  The spinal cord is normal in size and appearance. The posterior fossa, pituitary gland and paraspinal soft tissues are unremarkable.  On axial views there is no spinal stenosis or foraminal narrowing except: - C6-7 disc bulging and facet hypertrophy with mild bilateral foraminal stenosis.  Limited views of the soft tissues of the head and neck are unremarkable.  Impression MRI cervical spine (without) demonstrating: - At C6-7 disc bulging and facet hypertrophy with mild bilateral foraminal stenosis. Degenerative endplate disease with adjacent marrow edema at C6-7 levels. - Mild multilevel degenerative spondylosis, disc bulging from C3-4 down to C6-7. - No intrinsic or compressive spinal cord lesions.   INTERPRETING PHYSICIAN: Suanne Marker, MD Certified in Neurology, Neurophysiology and Neuroimaging  [facility] [address] [place], [place] [address] [phone]   MR Lumbar Spine w/o contrast  Narrative CLINICAL DATA:  Low back pain, > 6 wks  EXAM: MRI LUMBAR SPINE WITHOUT CONTRAST  TECHNIQUE: Multiplanar, multisequence MR imaging of the lumbar spine was performed. No intravenous contrast was administered.  COMPARISON:  03/05/2020 and prior.  FINDINGS: Segmentation:  Standard.  Alignment: Lumbar dextrocurvature. Slight reversal of lordosis. Minimal grade 1 L4-5 retrolisthesis. Minimal grade 1 L2-3, L3-4 anterolisthesis.  Vertebrae: Vertebral body heights are preserved. Multilevel Modic type 1/2 endplate degenerative changes. No focal osseous lesion.  Conus medullaris and cauda equina: Conus extends to the L1 level. Conus and cauda equina appear normal.  Disc  levels: Multilevel desiccation and Schmorl's node formation.  L1-2: Disc bulge with superimposed left foraminal protrusion. Bilateral facet hypertrophy. Patent right neural foramen. Mild spinal canal and severe left neural foraminal narrowing.  L2-3: Disc bulge with superimposed central and left foraminal protrusions. Bilateral facet hypertrophy. Patent right neural foramen. Mild spinal canal and left neural foraminal narrowing.  L3-4: Disc bulge with shallow central and bilateral foraminal protrusions. Small left extraforaminal protrusion. Bilateral facet hypertrophy. Patent spinal canal. Mild bilateral neural foraminal narrowing.  L4-5: Disc bulge with superimposed right foraminal and shallow right paracentral protrusions abutting the descending right L5 nerve root. Bilateral facet hypertrophy. Mild spinal canal, moderate right and mild left neural foraminal narrowing.  L5-S1: Disc bulge with superimposed right foraminal protrusion. Bilateral facet hypertrophy. Patent spinal canal. Mild bilateral neural foraminal narrowing.  Paraspinal and other soft tissues: Small right renal cysts.  IMPRESSION: Multilevel spondylosis. Lumbar dextrocurvature and slight reversal of lordosis.  Severe left L1-2 and moderate right L4-5 neural foraminal narrowing.  Mild spinal canal narrowing at the L1-3, L4-5 levels.  Mild left L2-3, bilateral L3-4, left L4-5 and bilateral L5-S1 neural foraminal narrowing.   Electronically Signed By: Stana Bunting M.D. On: 04/03/2020 09:19   MR Hip Left w/o contrast  Narrative CLINICAL DATA:  Chronic left hip pain.  EXAM: MR OF THE LEFT HIP WITHOUT  MR CERVICAL SPINE WO CONTRAST  Narrative [facility]  NEUROIMAGING REPORT   STUDY DATE: 06/09/21 PATIENT NAME: Joyce Brown DOB: [birth year] MRN: [id number]  ORDERING CLINICIAN: Levert Feinstein, MD CLINICAL HISTORY: 72 year old female with ataxia.  EXAM: MR CERVICAL SPINE WO CONTRAST TECHNIQUE: MRI of the cervical spine was obtained utilizing multiplanar, multiecho pulse sequences. CONTRAST: none COMPARISON: none  IMAGING SITE: [facility] AT [address] [place]    FINDINGS:  On sagittal views the vertebral bodies have normal height and alignment.  Degenerative spondylosis and disc bulging from C3-4 down to C6-7.  Degenerative marrow edema at C6 and C7 vertebral bodies anteriorly with degenerative endplate disease.  The spinal cord is normal in size and appearance. The posterior fossa, pituitary gland and paraspinal soft tissues are unremarkable.  On axial views there is no spinal stenosis or foraminal narrowing except: - C6-7 disc bulging and facet hypertrophy with mild bilateral foraminal stenosis.  Limited views of the soft tissues of the head and neck are unremarkable.  Impression MRI cervical spine (without) demonstrating: - At C6-7 disc bulging and facet hypertrophy with mild bilateral foraminal stenosis. Degenerative endplate disease with adjacent marrow edema at C6-7 levels. - Mild multilevel degenerative spondylosis, disc bulging from C3-4 down to C6-7. - No intrinsic or compressive spinal cord lesions.   INTERPRETING PHYSICIAN: Suanne Marker, MD Certified in Neurology, Neurophysiology and Neuroimaging  [facility] [address] [place], [place] [address] [phone]   MR Lumbar Spine w/o contrast  Narrative CLINICAL DATA:  Low back pain, > 6 wks  EXAM: MRI LUMBAR SPINE WITHOUT CONTRAST  TECHNIQUE: Multiplanar, multisequence MR imaging of the lumbar spine was performed. No intravenous contrast was administered.  COMPARISON:  03/05/2020 and prior.  FINDINGS: Segmentation:  Standard.  Alignment: Lumbar dextrocurvature. Slight reversal of lordosis. Minimal grade 1 L4-5 retrolisthesis. Minimal grade 1 L2-3, L3-4 anterolisthesis.  Vertebrae: Vertebral body heights are preserved. Multilevel Modic type 1/2 endplate degenerative changes. No focal osseous lesion.  Conus medullaris and cauda equina: Conus extends to the L1 level. Conus and cauda equina appear normal.  Disc  levels: Multilevel desiccation and Schmorl's node formation.  L1-2: Disc bulge with superimposed left foraminal protrusion. Bilateral facet hypertrophy. Patent right neural foramen. Mild spinal canal and severe left neural foraminal narrowing.  L2-3: Disc bulge with superimposed central and left foraminal protrusions. Bilateral facet hypertrophy. Patent right neural foramen. Mild spinal canal and left neural foraminal narrowing.  L3-4: Disc bulge with shallow central and bilateral foraminal protrusions. Small left extraforaminal protrusion. Bilateral facet hypertrophy. Patent spinal canal. Mild bilateral neural foraminal narrowing.  L4-5: Disc bulge with superimposed right foraminal and shallow right paracentral protrusions abutting the descending right L5 nerve root. Bilateral facet hypertrophy. Mild spinal canal, moderate right and mild left neural foraminal narrowing.  L5-S1: Disc bulge with superimposed right foraminal protrusion. Bilateral facet hypertrophy. Patent spinal canal. Mild bilateral neural foraminal narrowing.  Paraspinal and other soft tissues: Small right renal cysts.  IMPRESSION: Multilevel spondylosis. Lumbar dextrocurvature and slight reversal of lordosis.  Severe left L1-2 and moderate right L4-5 neural foraminal narrowing.  Mild spinal canal narrowing at the L1-3, L4-5 levels.  Mild left L2-3, bilateral L3-4, left L4-5 and bilateral L5-S1 neural foraminal narrowing.   Electronically Signed By: Stana Bunting M.D. On: 04/03/2020 09:19   MR Hip Left w/o contrast  Narrative CLINICAL DATA:  Chronic left hip pain.  EXAM: MR OF THE LEFT HIP WITHOUT  MR CERVICAL SPINE WO CONTRAST  Narrative [facility]  NEUROIMAGING REPORT   STUDY DATE: 06/09/21 PATIENT NAME: Joyce Brown DOB: [birth year] MRN: [id number]  ORDERING CLINICIAN: Levert Feinstein, MD CLINICAL HISTORY: 72 year old female with ataxia.  EXAM: MR CERVICAL SPINE WO CONTRAST TECHNIQUE: MRI of the cervical spine was obtained utilizing multiplanar, multiecho pulse sequences. CONTRAST: none COMPARISON: none  IMAGING SITE: [facility] AT [address] [place]    FINDINGS:  On sagittal views the vertebral bodies have normal height and alignment.  Degenerative spondylosis and disc bulging from C3-4 down to C6-7.  Degenerative marrow edema at C6 and C7 vertebral bodies anteriorly with degenerative endplate disease.  The spinal cord is normal in size and appearance. The posterior fossa, pituitary gland and paraspinal soft tissues are unremarkable.  On axial views there is no spinal stenosis or foraminal narrowing except: - C6-7 disc bulging and facet hypertrophy with mild bilateral foraminal stenosis.  Limited views of the soft tissues of the head and neck are unremarkable.  Impression MRI cervical spine (without) demonstrating: - At C6-7 disc bulging and facet hypertrophy with mild bilateral foraminal stenosis. Degenerative endplate disease with adjacent marrow edema at C6-7 levels. - Mild multilevel degenerative spondylosis, disc bulging from C3-4 down to C6-7. - No intrinsic or compressive spinal cord lesions.   INTERPRETING PHYSICIAN: Suanne Marker, MD Certified in Neurology, Neurophysiology and Neuroimaging  [facility] [address] [place], [place] [address] [phone]   MR Lumbar Spine w/o contrast  Narrative CLINICAL DATA:  Low back pain, > 6 wks  EXAM: MRI LUMBAR SPINE WITHOUT CONTRAST  TECHNIQUE: Multiplanar, multisequence MR imaging of the lumbar spine was performed. No intravenous contrast was administered.  COMPARISON:  03/05/2020 and prior.  FINDINGS: Segmentation:  Standard.  Alignment: Lumbar dextrocurvature. Slight reversal of lordosis. Minimal grade 1 L4-5 retrolisthesis. Minimal grade 1 L2-3, L3-4 anterolisthesis.  Vertebrae: Vertebral body heights are preserved. Multilevel Modic type 1/2 endplate degenerative changes. No focal osseous lesion.  Conus medullaris and cauda equina: Conus extends to the L1 level. Conus and cauda equina appear normal.  Disc  levels: Multilevel desiccation and Schmorl's node formation.  L1-2: Disc bulge with superimposed left foraminal protrusion. Bilateral facet hypertrophy. Patent right neural foramen. Mild spinal canal and severe left neural foraminal narrowing.  L2-3: Disc bulge with superimposed central and left foraminal protrusions. Bilateral facet hypertrophy. Patent right neural foramen. Mild spinal canal and left neural foraminal narrowing.  L3-4: Disc bulge with shallow central and bilateral foraminal protrusions. Small left extraforaminal protrusion. Bilateral facet hypertrophy. Patent spinal canal. Mild bilateral neural foraminal narrowing.  L4-5: Disc bulge with superimposed right foraminal and shallow right paracentral protrusions abutting the descending right L5 nerve root. Bilateral facet hypertrophy. Mild spinal canal, moderate right and mild left neural foraminal narrowing.  L5-S1: Disc bulge with superimposed right foraminal protrusion. Bilateral facet hypertrophy. Patent spinal canal. Mild bilateral neural foraminal narrowing.  Paraspinal and other soft tissues: Small right renal cysts.  IMPRESSION: Multilevel spondylosis. Lumbar dextrocurvature and slight reversal of lordosis.  Severe left L1-2 and moderate right L4-5 neural foraminal narrowing.  Mild spinal canal narrowing at the L1-3, L4-5 levels.  Mild left L2-3, bilateral L3-4, left L4-5 and bilateral L5-S1 neural foraminal narrowing.   Electronically Signed By: Stana Bunting M.D. On: 04/03/2020 09:19   MR Hip Left w/o contrast  Narrative CLINICAL DATA:  Chronic left hip pain.  EXAM: MR OF THE LEFT HIP WITHOUT  MR CERVICAL SPINE WO CONTRAST  Narrative [facility]  NEUROIMAGING REPORT   STUDY DATE: 06/09/21 PATIENT NAME: Joyce Brown DOB: [birth year] MRN: [id number]  ORDERING CLINICIAN: Levert Feinstein, MD CLINICAL HISTORY: 72 year old female with ataxia.  EXAM: MR CERVICAL SPINE WO CONTRAST TECHNIQUE: MRI of the cervical spine was obtained utilizing multiplanar, multiecho pulse sequences. CONTRAST: none COMPARISON: none  IMAGING SITE: [facility] AT [address] [place]    FINDINGS:  On sagittal views the vertebral bodies have normal height and alignment.  Degenerative spondylosis and disc bulging from C3-4 down to C6-7.  Degenerative marrow edema at C6 and C7 vertebral bodies anteriorly with degenerative endplate disease.  The spinal cord is normal in size and appearance. The posterior fossa, pituitary gland and paraspinal soft tissues are unremarkable.  On axial views there is no spinal stenosis or foraminal narrowing except: - C6-7 disc bulging and facet hypertrophy with mild bilateral foraminal stenosis.  Limited views of the soft tissues of the head and neck are unremarkable.  Impression MRI cervical spine (without) demonstrating: - At C6-7 disc bulging and facet hypertrophy with mild bilateral foraminal stenosis. Degenerative endplate disease with adjacent marrow edema at C6-7 levels. - Mild multilevel degenerative spondylosis, disc bulging from C3-4 down to C6-7. - No intrinsic or compressive spinal cord lesions.   INTERPRETING PHYSICIAN: Suanne Marker, MD Certified in Neurology, Neurophysiology and Neuroimaging  [facility] [address] [place], [place] [address] [phone]   MR Lumbar Spine w/o contrast  Narrative CLINICAL DATA:  Low back pain, > 6 wks  EXAM: MRI LUMBAR SPINE WITHOUT CONTRAST  TECHNIQUE: Multiplanar, multisequence MR imaging of the lumbar spine was performed. No intravenous contrast was administered.  COMPARISON:  03/05/2020 and prior.  FINDINGS: Segmentation:  Standard.  Alignment: Lumbar dextrocurvature. Slight reversal of lordosis. Minimal grade 1 L4-5 retrolisthesis. Minimal grade 1 L2-3, L3-4 anterolisthesis.  Vertebrae: Vertebral body heights are preserved. Multilevel Modic type 1/2 endplate degenerative changes. No focal osseous lesion.  Conus medullaris and cauda equina: Conus extends to the L1 level. Conus and cauda equina appear normal.  Disc  levels: Multilevel desiccation and Schmorl's node formation.  L1-2: Disc bulge with superimposed left foraminal protrusion. Bilateral facet hypertrophy. Patent right neural foramen. Mild spinal canal and severe left neural foraminal narrowing.  L2-3: Disc bulge with superimposed central and left foraminal protrusions. Bilateral facet hypertrophy. Patent right neural foramen. Mild spinal canal and left neural foraminal narrowing.  L3-4: Disc bulge with shallow central and bilateral foraminal protrusions. Small left extraforaminal protrusion. Bilateral facet hypertrophy. Patent spinal canal. Mild bilateral neural foraminal narrowing.  L4-5: Disc bulge with superimposed right foraminal and shallow right paracentral protrusions abutting the descending right L5 nerve root. Bilateral facet hypertrophy. Mild spinal canal, moderate right and mild left neural foraminal narrowing.  L5-S1: Disc bulge with superimposed right foraminal protrusion. Bilateral facet hypertrophy. Patent spinal canal. Mild bilateral neural foraminal narrowing.  Paraspinal and other soft tissues: Small right renal cysts.  IMPRESSION: Multilevel spondylosis. Lumbar dextrocurvature and slight reversal of lordosis.  Severe left L1-2 and moderate right L4-5 neural foraminal narrowing.  Mild spinal canal narrowing at the L1-3, L4-5 levels.  Mild left L2-3, bilateral L3-4, left L4-5 and bilateral L5-S1 neural foraminal narrowing.   Electronically Signed By: Stana Bunting M.D. On: 04/03/2020 09:19   MR Hip Left w/o contrast  Narrative CLINICAL DATA:  Chronic left hip pain.  EXAM: MR OF THE LEFT HIP WITHOUT  Patient: Joyce Brown  Service Category: E/M  Provider: Edward Jolly, MD  DOB: Mar 08, 1951  DOS: 12/09/2022  Referring Provider: Brett Albino*  MRN: [id number]  Setting: Ambulatory outpatient  PCP: Ronnald Ramp, MD  Type: New Patient  Specialty: Interventional Pain Management    Location: Office  Delivery: Face-to-face     Primary Reason(s) for Visit: Encounter for initial evaluation of one or more chronic problems (new to examiner) potentially causing chronic pain, and posing a threat to normal musculoskeletal function. (Level of risk: High) CC: Neck Pain and Back Pain  HPI  Joyce Brown is a 72 y.o. year old, female patient, who comes for the first time to our practice referred by [facility], [facility]* for our initial evaluation of her chronic pain. She has Cerebellar ataxia (HCC); Type I diabetes mellitus with complication, uncontrolled; Fibromyalgia; History of gastric bypass; Spinal stenosis; Breast calcifications; Pain of left hip joint; Chronic midline low back pain with bilateral sciatica; Gait abnormality; FH: ovarian cancer in first degree relative; Tinnitus, bilateral; End of life care; Sensorineural hearing loss (SNHL) of both ears; Lumbar spondylosis; Dyslipidemia; Myofascial pain; Wheelchair dependence; Adjustment disorder with mixed anxiety and depressed mood; Carpal tunnel syndrome; Degeneration of lumbar intervertebral disc; History of vaginal hysterectomy; Hyperparathyroidism (HCC); Increased PTH level; Intestinal malabsorption following gastrectomy; Lichen planus; Mild persistent asthma without complication; Non-organic enuresis; Obstructive sleep apnea on CPAP; Osteoarthritis of hand; Pain in joint involving ankle and foot; Pain of lumbar facet joint; Serum albumin decreased; Stiff person syndrome; Urge incontinence of urine; Vitamin B6 deficiency; Diabetic neuropathy (HCC); Type 1 diabetes mellitus with diabetic polyneuropathy (HCC); Squamous cell  carcinoma in situ; Bilateral tennis elbow; H/O bariatric surgery; Burning mouth syndrome; Facial rash; Rectal prolapse; Healthcare maintenance; Cervicalgia; and Arthritis of carpometacarpal (CMC) joint of both thumbs on their problem list. Today she comes in for evaluation of her Neck Pain and Back Pain  Pain Assessment: Location: Posterior, Right, Left Neck Radiating: Denies Onset: More than a month ago Duration: Chronic pain Quality: Constant, Sharp, Spasm, Cramping, Headache, Stabbing Severity: 6 /10 (subjective, self-reported pain score)  Effect on ADL: Limits ADLs Timing: Constant Modifying factors: TPI, tylenol, gabapentin, and tizanidine BP: 135/73  HR: 73  Onset and Duration: Gradual and Present longer than 3 months Cause of pain:  Arthritis,muscle spasms,fibromyalgia Severity: No change since onset, NAS-11 at its worse: 8/10, NAS-11 at its best: 3/10, NAS-11 now: 4/10, and NAS-11 on the average: 4/10 Timing: Not influenced by the time of the day, During activity or exercise, and After a period of immobility Aggravating Factors: Prolonged sitting, Prolonged standing, and Twisting Alleviating Factors: Acupuncture, Stretching, Medications, Sitting, Warm showers or baths, and Walking Associated Problems: Night-time cramps, Depression, Dizziness, Inability to concentrate, Spasms, Weakness, and Pain that wakes patient up Quality of Pain: Cramping, Sharp, Stabbing, and Superficial Previous Examinations or Tests: CT scan, MRI scan, X-rays, Neurological evaluation, and Orthopedic evaluation Previous Treatments: Physical Therapy, Pool exercises, Stretching exercises, TENS, Traction, and Trigger point injections  Joyce Brown is being evaluated for possible interventional pain management therapies for the treatment of her chronic pain.   Joyce Brown is a pleasant 72 year old female who has recently moved to [place] from [place].  She is hoping to have her pain management care  transferred here.  Of note she was seeing physical medicine and rehab in [place].  There she would get yearly radiofrequency ablations.  Her previous RFA was 01/06/2022 for the right side at L3, L4, L5 followed by left L3, L4, L5 RFA on  Patient: Joyce Brown  Service Category: E/M  Provider: Edward Jolly, MD  DOB: Mar 08, 1951  DOS: 12/09/2022  Referring Provider: Brett Albino*  MRN: [id number]  Setting: Ambulatory outpatient  PCP: Ronnald Ramp, MD  Type: New Patient  Specialty: Interventional Pain Management    Location: Office  Delivery: Face-to-face     Primary Reason(s) for Visit: Encounter for initial evaluation of one or more chronic problems (new to examiner) potentially causing chronic pain, and posing a threat to normal musculoskeletal function. (Level of risk: High) CC: Neck Pain and Back Pain  HPI  Joyce Brown is a 72 y.o. year old, female patient, who comes for the first time to our practice referred by [facility], [facility]* for our initial evaluation of her chronic pain. She has Cerebellar ataxia (HCC); Type I diabetes mellitus with complication, uncontrolled; Fibromyalgia; History of gastric bypass; Spinal stenosis; Breast calcifications; Pain of left hip joint; Chronic midline low back pain with bilateral sciatica; Gait abnormality; FH: ovarian cancer in first degree relative; Tinnitus, bilateral; End of life care; Sensorineural hearing loss (SNHL) of both ears; Lumbar spondylosis; Dyslipidemia; Myofascial pain; Wheelchair dependence; Adjustment disorder with mixed anxiety and depressed mood; Carpal tunnel syndrome; Degeneration of lumbar intervertebral disc; History of vaginal hysterectomy; Hyperparathyroidism (HCC); Increased PTH level; Intestinal malabsorption following gastrectomy; Lichen planus; Mild persistent asthma without complication; Non-organic enuresis; Obstructive sleep apnea on CPAP; Osteoarthritis of hand; Pain in joint involving ankle and foot; Pain of lumbar facet joint; Serum albumin decreased; Stiff person syndrome; Urge incontinence of urine; Vitamin B6 deficiency; Diabetic neuropathy (HCC); Type 1 diabetes mellitus with diabetic polyneuropathy (HCC); Squamous cell  carcinoma in situ; Bilateral tennis elbow; H/O bariatric surgery; Burning mouth syndrome; Facial rash; Rectal prolapse; Healthcare maintenance; Cervicalgia; and Arthritis of carpometacarpal (CMC) joint of both thumbs on their problem list. Today she comes in for evaluation of her Neck Pain and Back Pain  Pain Assessment: Location: Posterior, Right, Left Neck Radiating: Denies Onset: More than a month ago Duration: Chronic pain Quality: Constant, Sharp, Spasm, Cramping, Headache, Stabbing Severity: 6 /10 (subjective, self-reported pain score)  Effect on ADL: Limits ADLs Timing: Constant Modifying factors: TPI, tylenol, gabapentin, and tizanidine BP: 135/73  HR: 73  Onset and Duration: Gradual and Present longer than 3 months Cause of pain:  Arthritis,muscle spasms,fibromyalgia Severity: No change since onset, NAS-11 at its worse: 8/10, NAS-11 at its best: 3/10, NAS-11 now: 4/10, and NAS-11 on the average: 4/10 Timing: Not influenced by the time of the day, During activity or exercise, and After a period of immobility Aggravating Factors: Prolonged sitting, Prolonged standing, and Twisting Alleviating Factors: Acupuncture, Stretching, Medications, Sitting, Warm showers or baths, and Walking Associated Problems: Night-time cramps, Depression, Dizziness, Inability to concentrate, Spasms, Weakness, and Pain that wakes patient up Quality of Pain: Cramping, Sharp, Stabbing, and Superficial Previous Examinations or Tests: CT scan, MRI scan, X-rays, Neurological evaluation, and Orthopedic evaluation Previous Treatments: Physical Therapy, Pool exercises, Stretching exercises, TENS, Traction, and Trigger point injections  Joyce Brown is being evaluated for possible interventional pain management therapies for the treatment of her chronic pain.   Joyce Brown is a pleasant 72 year old female who has recently moved to [place] from [place].  She is hoping to have her pain management care  transferred here.  Of note she was seeing physical medicine and rehab in [place].  There she would get yearly radiofrequency ablations.  Her previous RFA was 01/06/2022 for the right side at L3, L4, L5 followed by left L3, L4, L5 RFA on

## 2022-12-09 NOTE — Patient Instructions (Signed)
______________________________________________________________________    Preparing for your procedure  Appointments: If you think you may not be able to keep your appointment, call 24-48 hours in advance to cancel. We need time to make it available to others.  During your procedure appointment there will be: No Prescription Refills. No disability issues to discussed. No medication changes or discussions.  Instructions: Food intake: Avoid eating anything solid for at least 8 hours prior to your procedure. Clear liquid intake: You may take clear liquids such as water up to 2 hours prior to your procedure. (No carbonated drinks. No soda.) Transportation: Unless otherwise stated by your physician, bring a driver. (Driver cannot be a Market researcher, Pharmacist, community, or any other form of public transportation.) Morning Medicines: Except for blood thinners, take all of your other morning medications with a sip of water. Make sure to take your heart and blood pressure medicines. If your blood pressure's lower number is above 100, the case will be rescheduled. Blood thinners: Make sure to stop your blood thinners as instructed.  If you take a blood thinner, but were not instructed to stop it, call our office [phone] and ask to talk to a nurse. Not stopping a blood thinner prior to certain procedures could lead to serious complications. Diabetics on insulin: Notify the staff so that you can be scheduled 1st case in the morning. If your diabetes requires high dose insulin, take only  of your normal insulin dose the morning of the procedure and notify the staff that you have done so. Preventing infections: Shower with an antibacterial soap the morning of your procedure.  Build-up your immune system: Take 1000 mg of Vitamin C with every meal (3 times a day) the day prior to your procedure. Antibiotics: Inform the nursing staff if you are taking any antibiotics or if you have any conditions that may require antibiotics  prior to procedures. (Example: recent joint implants)   Pregnancy: If you are pregnant make sure to notify the nursing staff. Not doing so may result in injury to the fetus, including death.  Sickness: If you have a cold, fever, or any active infections, call and cancel or reschedule your procedure. Receiving steroids while having an infection may result in complications. Arrival: You must be in the facility at least 30 minutes prior to your scheduled procedure. Tardiness: Your scheduled time is also the cutoff time. If you do not arrive at least 15 minutes prior to your procedure, you will be rescheduled.  Children: Do not bring any children with you. Make arrangements to keep them home. Dress appropriately: There is always a possibility that your clothing may get soiled. Avoid long dresses. Valuables: Do not bring any jewelry or valuables.  Reasons to call and reschedule or cancel your procedure: (Following these recommendations will minimize the risk of a serious complication.) Surgeries: Avoid having procedures within 2 weeks of any surgery. (Avoid for 2 weeks before or after any surgery). Flu Shots: Avoid having procedures within 2 weeks of a flu shots or . (Avoid for 2 weeks before or after immunizations). Barium: Avoid having a procedure within 7-10 days after having had a radiological study involving the use of radiological contrast. (Myelograms, Barium swallow or enema study). Heart attacks: Avoid any elective procedures or surgeries for the initial 6 months after a "Myocardial Infarction" (Heart Attack). Blood thinners: It is imperative that you stop these medications before procedures. Let us know if you if you take any blood thinner.  Infection: Avoid procedures during or within  two weeks of an infection (including chest colds or gastrointestinal problems). Symptoms associated with infections include: Localized redness, fever, chills, night sweats or profuse sweating, burning sensation  when voiding, cough, congestion, stuffiness, runny nose, sore throat, diarrhea, nausea, vomiting, cold or Flu symptoms, recent or current infections. It is specially important if the infection is over the area that we intend to treat. Heart and lung problems: Symptoms that may suggest an active cardiopulmonary problem include: cough, chest pain, breathing difficulties or shortness of breath, dizziness, ankle swelling, uncontrolled high or unusually low blood pressure, and/or palpitations. If you are experiencing any of these symptoms, cancel your procedure and contact your primary care physician for an evaluation.  Remember:  Regular Business hours are:  Monday to Thursday 8:00 AM to 4:00 PM  Provider's Schedule: Delano Metz, MD:  Procedure days: Tuesday and Thursday 7:30 AM to 4:00 PM  Edward Jolly, MD:  Procedure days: Monday and Wednesday 7:30 AM to 4:00 PM Last  Updated: 11/02/2022 ______________________________________________________________________

## 2022-12-16 ENCOUNTER — Encounter: Payer: Self-pay | Admitting: Dietician

## 2022-12-16 ENCOUNTER — Encounter: Payer: Medicare Other | Attending: Internal Medicine | Admitting: Dietician

## 2022-12-16 ENCOUNTER — Other Ambulatory Visit: Payer: Self-pay

## 2022-12-16 VITALS — Wt 152.6 lb

## 2022-12-16 DIAGNOSIS — E1142 Type 2 diabetes mellitus with diabetic polyneuropathy: Secondary | ICD-10-CM

## 2022-12-16 DIAGNOSIS — E1042 Type 1 diabetes mellitus with diabetic polyneuropathy: Secondary | ICD-10-CM | POA: Insufficient documentation

## 2022-12-16 DIAGNOSIS — Z6821 Body mass index (BMI) 21.0-21.9, adult: Secondary | ICD-10-CM | POA: Diagnosis not present

## 2022-12-16 DIAGNOSIS — Z713 Dietary counseling and surveillance: Secondary | ICD-10-CM | POA: Insufficient documentation

## 2022-12-16 MED ORDER — AREXVY 120 MCG/0.5ML IM SUSR
INTRAMUSCULAR | 0 refills | Status: DC
  Filled 2022-12-16: qty 0.5, 1d supply, fill #0

## 2022-12-16 MED ORDER — COMIRNATY 30 MCG/0.3ML IM SUSY
PREFILLED_SYRINGE | INTRAMUSCULAR | 0 refills | Status: DC
  Filled 2022-12-16: qty 0.3, 1d supply, fill #0

## 2022-12-16 NOTE — Patient Instructions (Signed)
Try adding [place] yogurt or an egg to breakfast meal to help prevent lows before lunch Plan to eat a snack when more active to help prevent lows. Include carb and protein in the snack. When checking food labels for carbs, subtract the fiber grams from the total carbs, especially when 5g or more.  Aim for 45 - 55g of carbs with each meal. Consistent intake is more important when taking a consistent insulin dose with meals.

## 2022-12-16 NOTE — Progress Notes (Signed)
Diabetes Self-Management Education  Visit Type:  Follow-up  Appt. Start Time: 0915 Appt. End Time: 1020  12/16/2022  Joyce Brown, identified by name and date of birth, is a 72 y.o. female with a diagnosis of Diabetes:  .    ASSESSMENT  Weight 152 lb 9.6 oz (69.2 kg). Body mass index is 21.9 kg/m.  Patient in for first visit at [facility]; previous visits with Oran Rein, RD, CDCES at [facility].  She is keeping careful records of carb counts as well as protein. Carb intake ranges from 22g - 91g per meal; some high fiber meals -- is not subtracting fiber from total carbs on food labels.  She has had some episodes of low BGs, often after physical activity. Sometimes over-treats (ie had juice and candy bar during one episode at stage event, and subsequent high BG). Hypoglycemic episodes have decreased in frequency.  She would like help in preventing hypoglycemic episodes. She has recently moved to [place] from [place] and reports increased stress and less healthy eating habits for the past 2 months.  Time in Range and average glucose have decreased significantly.  She continues with set doses for premeal insulin which has helped reduce hypoglycemia.  CGM Results from download: 01/29/22 05/27/2022 10/07/2022 12/16/22  % Time CGM active:   93 %   (Goal >70%) 93 93   Average glucose:   14 mg/dL for 14 days 725 mg/dL for 14 days 366 mg/dL for 14 days 440  Glucose management indicator:   unknown 7.7% 7.3   Time in range (70-180 mg/dL):   72 %   (Goal >34%) 52% 60 43  Time High (181-250 mg/dL):   16 %   (Goal < 74%) 28% 26 12  Time Very High (>250 mg/dL):    9 %   (Goal < 5%) 17% 10 45  Time Low (54-69 mg/dL):   <2 %   (Goal <2%) 1% 30 0  Time Very Low (<54 mg/dL):    %   (Goal <5%) 2% 1 <1%      Diabetes Self-Management Education - 12/16/22 0922       Complications   Last HgB A1C per patient/outside source 8.2 %    How often do you check your blood sugar? > 4  times/day    Fasting Blood glucose range (mg/dL) <95;63-875;643-329;518-841;>660    Postprandial Blood glucose range (mg/dL) <63;01-601;093-235;573-220;>254    Number of hypoglycemic episodes per month 5   multiple episodes   Can you tell when your blood sugar is low? No    Number of hyperglycemic episodes ( >200mg /dL): Daily    Have you had a dilated eye exam in the past 12 months? Yes      Activity / Exercise   Activity / Exercise Type Moderate (swimming / aerobic walking)   water walking   How many days per week do you exercise? 3    How many minutes per day do you exercise? 45    Total minutes per week of exercise 135      Patient Education   Healthy Eating Food label reading, portion sizes and measuring food.;Meal timing in regards to the patients' current diabetes medication.;Meal options for control of blood glucose level and chronic complications.;Role of diet in the treatment of diabetes and the relationship between the three main macronutrients and blood glucose level    Being Active Role of exercise on diabetes management, blood pressure control and cardiac health.;Identified with patient nutritional and/or medication changes necessary  with exercise.    Acute complications Taught prevention, symptoms, and  treatment of hypoglycemia - the 15 rule.             Learning Objective:  Patient will have a greater understanding of diabetes self-management. Patient education plan is to attend individual and/or group sessions per assessed needs and concerns.   Plan:   Patient Instructions  Try adding Greek yogurt or an egg to breakfast meal to help prevent lows before lunch Plan to eat a snack when more active to help prevent lows. Include carb and protein in the snack. When checking food labels for carbs, subtract the fiber grams from the total carbs, especially when 5g or more.  Aim for 45 - 55g of carbs with each meal. Consistent intake is more important when taking a  consistent insulin dose with meals.    Expected Outcomes:  Demonstrated interest in learning. Expect positive outcomes  Education material provided: Bariatric menus for 1200 and 1500kcal; Quick and Simple menus  If problems or questions, patient to contact team via:  Phone and patient portal  Future DSME appointment: - 2 months 02/17/23 at 9:00am

## 2022-12-17 ENCOUNTER — Ambulatory Visit: Payer: Medicare Other

## 2022-12-17 DIAGNOSIS — M2010 Hallux valgus (acquired), unspecified foot: Secondary | ICD-10-CM

## 2022-12-17 DIAGNOSIS — M2040 Other hammer toe(s) (acquired), unspecified foot: Secondary | ICD-10-CM

## 2022-12-17 DIAGNOSIS — S92351D Displaced fracture of fifth metatarsal bone, right foot, subsequent encounter for fracture with routine healing: Secondary | ICD-10-CM

## 2022-12-17 DIAGNOSIS — E1042 Type 1 diabetes mellitus with diabetic polyneuropathy: Secondary | ICD-10-CM

## 2022-12-17 NOTE — Progress Notes (Signed)
Patient presents to the office today for diabetic shoe and insole measuring.  Patient was measured with brannock device to determine size and width for 1 pair of extra depth shoes and foam casted for 3 pair of insoles.   Documentation of medical necessity will be sent to patient's treating diabetic doctor to verify and sign.   Patient's diabetic provider: Ronald Lobo / [place]   Shoes and insoles will be ordered at that time and patient will be notified for an appointment for fitting when they arrive.   Shoe size (per patient): 10.5  Brannock measurement: 10 Patient shoe selection- Shoe choice:   [id number] Shoe size ordered: 10.5 M   ABN and financials signed  Addison Bailey Cped, CFo, CFm

## 2022-12-20 DIAGNOSIS — E1065 Type 1 diabetes mellitus with hyperglycemia: Secondary | ICD-10-CM | POA: Diagnosis not present

## 2022-12-20 DIAGNOSIS — Z9884 Bariatric surgery status: Secondary | ICD-10-CM | POA: Diagnosis not present

## 2022-12-27 ENCOUNTER — Ambulatory Visit (HOSPITAL_BASED_OUTPATIENT_CLINIC_OR_DEPARTMENT_OTHER): Payer: Medicare Other | Admitting: Student in an Organized Health Care Education/Training Program

## 2022-12-27 ENCOUNTER — Ambulatory Visit
Admission: RE | Admit: 2022-12-27 | Discharge: 2022-12-27 | Disposition: A | Discharge status: Home / Self Care | Payer: Medicare Other | Source: Ambulatory Visit | Attending: Student in an Organized Health Care Education/Training Program | Admitting: Student in an Organized Health Care Education/Training Program

## 2022-12-27 ENCOUNTER — Other Ambulatory Visit: Payer: Self-pay | Admitting: Student in an Organized Health Care Education/Training Program

## 2022-12-27 ENCOUNTER — Encounter: Payer: Self-pay | Admitting: Student in an Organized Health Care Education/Training Program

## 2022-12-27 VITALS — BP 151/79 | HR 71 | Temp 97.3°F | Resp 18 | Ht 70.0 in | Wt 171.0 lb

## 2022-12-27 DIAGNOSIS — M180 Bilateral primary osteoarthritis of first carpometacarpal joints: Secondary | ICD-10-CM | POA: Diagnosis not present

## 2022-12-27 DIAGNOSIS — R52 Pain, unspecified: Secondary | ICD-10-CM | POA: Insufficient documentation

## 2022-12-27 DIAGNOSIS — M542 Cervicalgia: Secondary | ICD-10-CM

## 2022-12-27 MED ORDER — DEXAMETHASONE SODIUM PHOSPHATE 10 MG/ML IJ SOLN
INTRAMUSCULAR | Status: AC
  Filled 2022-12-27: qty 1

## 2022-12-27 MED ORDER — ROPIVACAINE HCL 2 MG/ML IJ SOLN
INTRAMUSCULAR | Status: AC
  Filled 2022-12-27: qty 20

## 2022-12-27 MED ORDER — ROPIVACAINE HCL 2 MG/ML IJ SOLN
9.0000 mL | Freq: Once | INTRAMUSCULAR | Status: AC
  Administered 2022-12-27: 9 mL via PERINEURAL

## 2022-12-27 MED ORDER — DEXAMETHASONE SODIUM PHOSPHATE 10 MG/ML IJ SOLN
10.0000 mg | Freq: Once | INTRAMUSCULAR | Status: AC
  Administered 2022-12-27: 10 mg

## 2022-12-27 MED ORDER — LIDOCAINE HCL 2 % IJ SOLN
20.0000 mL | Freq: Once | INTRAMUSCULAR | Status: AC
  Administered 2022-12-27: 400 mg

## 2022-12-27 NOTE — Progress Notes (Signed)
PROVIDER NOTE: Interpretation of information contained herein should be left to medically-trained personnel. Specific patient instructions are provided elsewhere under "Patient Instructions" section of medical record. This document was created in part using STT-dictation technology, any transcriptional errors that may result from this process are unintentional.  Patient: Joyce Brown Type: Established DOB: February 23, 1951 MRN: [id number] PCP: Ronnald Ramp, MD  Service: Procedure DOS: 12/27/2022 Setting: Ambulatory Location: Ambulatory outpatient facility Delivery: Face-to-face Provider: Edward Jolly, MD Specialty: Interventional Pain Management Specialty designation: 09 Location: Outpatient facility Ref. Prov.: Simmons-Robinson, [facility]*       Interventional Therapy   Primary Reason for Visit: Interventional Pain Management Treatment. CC: Neck Pain and thumb (bilat)    Procedure:          Anesthesia, Analgesia, Anxiolysis:  Type: Diagnostic CMC (Carpometacarpal) Steroid Injection  #1 and Cervical TPI  Region: Dorsal Interspace between [facility] and Trapezium Level: Wrist Laterality: Bilateral  Type: Local Anesthesia Local Anesthetic: Lidocaine 1-2% Sedation: None  Indication(s):  Analgesia Route: Infiltration (Reynolds/IM) IV Access: N/A   Position: Sitting   1. Arthritis of carpometacarpal (CMC) joint of both thumbs   2. Cervicalgia    NAS-11 Pain score:   Pre-procedure: 5 /10   Post-procedure: 3 /10     H&P (Pre-op Assessment):  Joyce Brown is a 72 y.o. (year old), female patient, seen today for interventional treatment. She  has a past surgical history that includes Gastric bypass (2014); Ankle surgery (Left); Bilateral carpal tunnel release; Adenoidectomy; Elbow surgery; Hand neuroplasty; Tonsillectomy; Abdominal hysterectomy; Breast biopsy (Left, 2019); radio denervation (05/21/2020); Small intestine surgery (08/18/2013); Eye surgery (03/14/22 & 03/17/22); Colon  surgery (09/25/2012); and Abdominal hysterectomy (1997). Joyce Brown has a current medication list which includes the following prescription(s): acetaminophen, antiseptic oral rinse, atorvastatin, bd insulin syringe u/f 1/2unit, dexcom g7 sensor, comirnaty, docusate sodium, estradiol, gabapentin, glucose blood, novolog penfill, tresiba flextouch, droplet pen needles, microlet lancets, miebo, mycophenolate, myrbetriq, omega 3, arexvy, sertraline, tizanidine, and twinrix, and the following Facility-Administered Medications: dexamethasone, lidocaine, and ropivacaine (pf) 2 mg/ml (0.2%). Her primarily concern today is the Neck Pain and thumb (bilat)  Initial Vital Signs:  Pulse/HCG Rate: 71ECG Heart Rate: 73 Temp: (!) 97.3 F (36.3 C) Resp: 16 BP: 126/73 SpO2: 100 %  BMI: Estimated body mass index is 24.54 kg/m as calculated from the following:   Height as of this encounter: 5\' 10"  (1.778 m).   Weight as of this encounter: 171 lb (77.6 kg).  Risk Assessment: Allergies: Reviewed. She has No Known Allergies.  Allergy Precautions: None required Coagulopathies: Reviewed. None identified.  Blood-thinner therapy: None at this time Active Infection(s): Reviewed. None identified. Joyce Brown is afebrile  Site Confirmation: Ms. Sylvia was asked to confirm the procedure and laterality before marking the site Procedure checklist: Completed Consent: Before the procedure and under the influence of no sedative(s), amnesic(s), or anxiolytics, the patient was informed of the treatment options, risks and possible complications. To fulfill our ethical and legal obligations, as recommended by the American Medical Association's Code of Ethics, I have informed the patient of my clinical impression; the nature and purpose of the treatment or procedure; the risks, benefits, and possible complications of the intervention; the alternatives, including doing nothing; the risk(s) and benefit(s) of the alternative  treatment(s) or procedure(s); and the risk(s) and benefit(s) of doing nothing. The patient was provided information about the general risks and possible complications associated with the procedure. These may include, but are not limited to: failure to achieve desired goals, infection, bleeding,  organ or nerve damage, allergic reactions, paralysis, and death. In addition, the patient was informed of those risks and complications associated to the procedure, such as failure to decrease pain; infection; bleeding; organ or nerve damage with subsequent damage to sensory, motor, and/or autonomic systems, resulting in permanent pain, numbness, and/or weakness of one or several areas of the body; allergic reactions; (i.e.: anaphylactic reaction); and/or death. Furthermore, the patient was informed of those risks and complications associated with the medications. These include, but are not limited to: allergic reactions (i.e.: anaphylactic or anaphylactoid reaction(s)); adrenal axis suppression; blood sugar elevation that in diabetics may result in ketoacidosis or comma; water retention that in patients with history of congestive heart failure may result in shortness of breath, pulmonary edema, and decompensation with resultant heart failure; weight gain; swelling or edema; medication-induced neural toxicity; particulate matter embolism and blood vessel occlusion with resultant organ, and/or nervous system infarction; and/or aseptic necrosis of one or more joints. Finally, the patient was informed that Medicine is not an exact science; therefore, there is also the possibility of unforeseen or unpredictable risks and/or possible complications that may result in a catastrophic outcome. The patient indicated having understood very clearly. We have given the patient no guarantees and we have made no promises. Enough time was given to the patient to ask questions, all of which were answered to the patient's satisfaction. Ms.  Brown has indicated that she wanted to continue with the procedure. Attestation: I, the ordering provider, attest that I have discussed with the patient the benefits, risks, side-effects, alternatives, likelihood of achieving goals, and potential problems during recovery for the procedure that I have provided informed consent. Date  Time: 12/27/2022  8:55 AM  Pre-Procedure Preparation:  Monitoring: As per clinic protocol. Respiration, ETCO2, SpO2, BP, heart rate and rhythm monitor placed and checked for adequate function Safety Precautions: Patient was assessed for positional comfort and pressure points before starting the procedure. Time-out: I initiated and conducted the "Time-out" before starting the procedure, as per protocol. The patient was asked to participate by confirming the accuracy of the "Time Out" information. Verification of the correct person, site, and procedure were performed and confirmed by me, the nursing staff, and the patient. "Time-out" conducted as per Joint Commission's Universal Protocol (UP.01.01.01). Time: 0958 Start Time: 0958 hrs.  Description of Procedure:          Target Area: Space between [facility] and Trapezium Approach: Dorsal approach. Area Prepped: Entire wrist Region DuraPrep (Iodine Povacrylex [0.7% available iodine] and Isopropyl Alcohol, 74% w/w) Safety Precautions: Aspiration looking for blood return was conducted prior to all injections. At no point did we inject any substances, as a needle was being advanced. No attempts were made at seeking any paresthesias. Safe injection practices and needle disposal techniques used. Medications properly checked for expiration dates. SDV (single dose vial) medications used. Description of the Procedure: Protocol guidelines were followed. The patient was placed in position. The target area was identified and the area prepped in the usual manner. Skin & deeper tissues infiltrated with local anesthetic. Appropriate amount  of time allowed to pass for local anesthetics to take effect. The procedure needles were then advanced to the target area. Proper needle placement secured. Negative aspiration confirmed. Solution injected in intermittent fashion, asking for systemic symptoms every 0.5cc of injectate. The needles were then removed and the area cleansed, making sure to leave some of the prepping solution back to take advantage of its long term bactericidal properties.  8 cc  solution made of 7 cc of 0.2% lidocaine, 1 cc of Decadron 10 mg/cc. 4 cc injected into the left and right CMC joint   Afterwards trigger point injections were done in the cervical region.  Needling was performed.  0.5 to 1 cc of 0.2% Vivacaine was injected into each cervical trigger point.    Vitals:   12/27/22 0905 12/27/22 0955 12/27/22 1000 12/27/22 1005  BP: 126/73 (!) 150/70 (!) 153/79 (!) 151/79  Pulse: 71     Resp: 16 16 16 18   Temp: (!) 97.3 F (36.3 C)     SpO2: 100% 98% 99% 99%  Weight: 171 lb (77.6 kg)     Height: 5\' 10"  (1.778 m)       Start Time: 0958 hrs. End Time: 1001 hrs. Materials:   Antibiotic Prophylaxis:   Anti-infectives (From admission, onward)    None      Indication(s): None identified  Post-operative Assessment:  Post-procedure Vital Signs:  Pulse/HCG Rate: 7173 Temp: (!) 97.3 F (36.3 C) Resp: 18 BP: (!) 151/79 SpO2: 99 %  EBL: None  Complications: No immediate post-treatment complications observed by team, or reported by patient.  Note: The patient tolerated the entire procedure well. A repeat set of vitals were taken after the procedure and the patient was kept under observation following institutional policy, for this type of procedure. Post-procedural neurological assessment was performed, showing return to baseline, prior to discharge. The patient was provided with post-procedure discharge instructions, including a section on how to identify potential problems. Should any problems arise  concerning this procedure, the patient was given instructions to immediately contact us, at any time, without hesitation. In any case, we plan to contact the patient by telephone for a follow-up status report regarding this interventional procedure.  Comments:  No additional relevant information.  Plan of Care (POC)  Orders:  Orders Placed This Encounter  Procedures   DG PAIN CLINIC C-ARM 1-60 MIN NO REPORT    Intraoperative interpretation by procedural physician at [facility].    Standing Status:   Standing    Number of Occurrences:   1    Order Specific Question:   Reason for exam:    Answer:   Assistance in needle guidance and placement for procedures requiring needle placement in or near specific anatomical locations not easily accessible without such assistance.     Medications ordered for procedure: Meds ordered this encounter  Medications   lidocaine (XYLOCAINE) 2 % (with pres) injection 400 mg   dexamethasone (DECADRON) injection 10 mg   ropivacaine (PF) 2 mg/mL (0.2%) (NAROPIN) injection 9 mL   Medications administered: [place] Hun. Marlar had no medications administered during this visit.  See the medical record for exact dosing, route, and time of administration.  Follow-up plan:   Return in about 30 days (around 01/26/2023) for B/L L3,4,5 RFA.       B/L CMC injection and Cervical TPI    Recent Visits Date Type Provider Dept  12/09/22 Office Visit Edward Jolly, MD [facility]  Showing recent visits within past 90 days and meeting all other requirements Today's Visits Date Type Provider Dept  12/27/22 Procedure visit Edward Jolly, MD [facility]  Showing today's visits and meeting all other requirements Future Appointments No visits were found meeting these conditions. Showing future appointments within next 90 days and meeting all other requirements  Disposition: Discharge home  Discharge (Date  Time): 12/27/2022; 1009 hrs.    Primary Care Physician: Ronnald Ramp,  MD Location: [facility] Note by: Edward Jolly, MD (TTS technology used. I apologize for any typographical errors that were not detected and corrected.) Date: 12/27/2022; Time: 11:00 AM  Disclaimer:  Medicine is not an Visual merchandiser. The only guarantee in medicine is that nothing is guaranteed. It is important to note that the decision to proceed with this intervention was based on the information collected from the patient. The Data and conclusions were drawn from the patient's questionnaire, the interview, and the physical examination. Because the information was provided in large part by the patient, it cannot be guaranteed that it has not been purposely or unconsciously manipulated. Every effort has been made to obtain as much relevant data as possible for this evaluation. It is important to note that the conclusions that lead to this procedure are derived in large part from the available data. Always take into account that the treatment will also be dependent on availability of resources and existing treatment guidelines, considered by other Pain Management Practitioners as being common knowledge and practice, at the time of the intervention. For Medico-Legal purposes, it is also important to point out that variation in procedural techniques and pharmacological choices are the acceptable norm. The indications, contraindications, technique, and results of the above procedure should only be interpreted and judged by a Board-Certified Interventional Pain Specialist with extensive familiarity and expertise in the same exact procedure and technique.

## 2022-12-27 NOTE — Patient Instructions (Addendum)
Procedure instructions  Do not eat or drink fluids (other than water) for 6 hours before your procedure  No water for 2 hours before your procedure  Take your blood pressure medicine with a sip of water  Arrive 30 minutes before your appointment  Carefully read the "Preparing for your procedure" detailed instructions  If you have questions call us at [phone]  _____________________________________________________________________    ______________________________________________________________________  Preparing for your procedure  Appointments: If you think you may not be able to keep your appointment, call 24-48 hours in advance to cancel. We need time to make it available to others.  During your procedure appointment there will be: No Prescription Refills. No disability issues to discussed. No medication changes or discussions.  Instructions: Food intake: Avoid eating anything solid for at least 8 hours prior to your procedure. Clear liquid intake: You may take clear liquids such as water up to 2 hours prior to your procedure. (No carbonated drinks. No soda.) Transportation: Unless otherwise stated by your physician, bring a driver. Morning Medicines: Except for blood thinners, take all of your other morning medications with a sip of water. Make sure to take your heart and blood pressure medicines. If your blood pressure's lower number is above 100, the case will be rescheduled. Blood thinners: Make sure to stop your blood thinners as instructed.  If you take a blood thinner, but were not instructed to stop it, call our office [phone] and ask to talk to a nurse. Not stopping a blood thinner prior to certain procedures could lead to serious complications. Diabetics on insulin: Notify the staff so that you can be scheduled 1st case in the morning. If your diabetes requires high dose insulin, take only  of your normal insulin dose the morning of the procedure and  notify the staff that you have done so. Preventing infections: Shower with an antibacterial soap the morning of your procedure.  Build-up your immune system: Take 1000 mg of Vitamin C with every meal (3 times a day) the day prior to your procedure. Antibiotics: Inform the nursing staff if you are taking any antibiotics or if you have any conditions that may require antibiotics prior to procedures. (Example: recent joint implants)   Pregnancy: If you are pregnant make sure to notify the nursing staff. Not doing so may result in injury to the fetus, including death.  Sickness: If you have a cold, fever, or any active infections, call and cancel or reschedule your procedure. Receiving steroids while having an infection may result in complications. Arrival: You must be in the facility at least 30 minutes prior to your scheduled procedure. Tardiness: Your scheduled time is also the cutoff time. If you do not arrive at least 15 minutes prior to your procedure, you will be rescheduled.  Children: Do not bring any children with you. Make arrangements to keep them home. Dress appropriately: There is always a possibility that your clothing may get soiled. Avoid long dresses. Valuables: Do not bring any jewelry or valuables.  Reasons to call and reschedule or cancel your procedure: (Following these recommendations will minimize the risk of a serious complication.) Surgeries: Avoid having procedures within 2 weeks of any surgery. (Avoid for 2 weeks before or after any surgery). Flu Shots: Avoid having procedures within 2 weeks of a flu shots or . (Avoid for 2 weeks before or after immunizations). Barium: Avoid having a procedure within 7-10 days after having had a radiological study involving the use of radiological contrast. (  Myelograms, Barium swallow or enema study). Heart attacks: Avoid any elective procedures or surgeries for the initial 6 months after a "Myocardial Infarction" (Heart Attack). Blood  thinners: It is imperative that you stop these medications before procedures. Let us know if you if you take any blood thinner.  Infection: Avoid procedures during or within two weeks of an infection (including chest colds or gastrointestinal problems). Symptoms associated with infections include: Localized redness, fever, chills, night sweats or profuse sweating, burning sensation when voiding, cough, congestion, stuffiness, runny nose, sore throat, diarrhea, nausea, vomiting, cold or Flu symptoms, recent or current infections. It is specially important if the infection is over the area that we intend to treat. Heart and lung problems: Symptoms that may suggest an active cardiopulmonary problem include: cough, chest pain, breathing difficulties or shortness of breath, dizziness, ankle swelling, uncontrolled high or unusually low blood pressure, and/or palpitations. If you are experiencing any of these symptoms, cancel your procedure and contact your primary care physician for an evaluation.  Remember:  Regular Business hours are:  Monday to Thursday 8:00 AM to 4:00 PM  Provider's Schedule: Delano Metz, MD:  Procedure days: Tuesday and Thursday 7:30 AM to 4:00 PM  Edward Jolly, MD:  Procedure days: Monday and Wednesday 7:30 AM to 4:00 PM   Post-Procedure Discharge Instructions  Instructions: Apply ice:  Purpose: This will minimize any swelling and discomfort after procedure.  When: Day of procedure, as soon as you get home. How: Fill a plastic sandwich bag with crushed ice. Cover it with a small towel and apply to injection site. How long: (15 min on, 15 min off) Apply for 15 minutes then remove x 15 minutes.  Repeat sequence on day of procedure, until you go to bed. Apply heat:  Purpose: To treat any soreness and discomfort from the procedure. When: Starting the next day after the procedure. How: Apply heat to procedure site starting the day following the procedure. How long: May  continue to repeat daily, until discomfort goes away. Food intake: Start with clear liquids (like water) and advance to regular food, as tolerated.  Physical activities: Keep activities to a minimum for the first 8 hours after the procedure. After that, then as tolerated. Driving: If you have received any sedation, be responsible and do not drive. You are not allowed to drive for 24 hours after having sedation. Blood thinner: (Applies only to those taking blood thinners) You may restart your blood thinner 6 hours after your procedure. Insulin: (Applies only to Diabetic patients taking insulin) As soon as you can eat, you may resume your normal dosing schedule. Infection prevention: Keep procedure site clean and dry. Shower daily and clean area with soap and water. Post-procedure Pain Diary: Extremely important that this be done correctly and accurately. Recorded information will be used to determine the next step in treatment. For the purpose of accuracy, follow these rules: Evaluate only the area treated. Do not report or include pain from an untreated area. For the purpose of this evaluation, ignore all other areas of pain, except for the treated area. After your procedure, avoid taking a long nap and attempting to complete the pain diary after you wake up. Instead, set your alarm clock to go off every hour, on the hour, for the initial 8 hours after the procedure. Document the duration of the numbing medicine, and the relief you are getting from it. Do not go to sleep and attempt to complete it later. It will not be accurate.  If you received sedation, it is likely that you were given a medication that may cause amnesia. Because of this, completing the diary at a later time may cause the information to be inaccurate. This information is needed to plan your care. Follow-up appointment: Keep your post-procedure follow-up evaluation appointment after the procedure (usually 2 weeks for most procedures, 6  weeks for radiofrequencies). DO NOT FORGET to bring you pain diary with you.   Expect: (What should I expect to see with my procedure?) From numbing medicine (AKA: Local Anesthetics): Numbness or decrease in pain. You may also experience some weakness, which if present, could last for the duration of the local anesthetic. Onset: Full effect within 15 minutes of injected. Duration: It will depend on the type of local anesthetic used. On the average, 1 to 8 hours.  From steroids (Applies only if steroids were used): Decrease in swelling or inflammation. Once inflammation is improved, relief of the pain will follow. Onset of benefits: Depends on the amount of swelling present. The more swelling, the longer it will take for the benefits to be seen. In some cases, up to 10 days. Duration: Steroids will stay in the system x 2 weeks. Duration of benefits will depend on multiple posibilities including persistent irritating factors. Side-effects: If present, they may typically last 2 weeks (the duration of the steroids). Frequent: Cramps (if they occur, drink Gatorade and take over-the-counter Magnesium 450-500 mg once to twice a day); water retention with temporary weight gain; increases in blood sugar; decreased immune system response; increased appetite. Occasional: Facial flushing (red, warm cheeks); mood swings; menstrual changes. Uncommon: Long-term decrease or suppression of natural hormones; bone thinning. (These are more common with higher doses or more frequent use. This is why we prefer that our patients avoid having any injection therapies in other practices.)  Very Rare: Severe mood changes; psychosis; aseptic necrosis. From procedure: Some discomfort is to be expected once the numbing medicine wears off. This should be minimal if ice and heat are applied as instructed.  Call if: (When should I call?) You experience numbness and weakness that gets worse with time, as opposed to wearing off. New  onset bowel or bladder incontinence. (Applies only to procedures done in the spine)  Emergency Numbers: Durning business hours (Monday - Thursday, 8:00 AM - 4:00 PM) (Friday, 9:00 AM - 12:00 Noon): [phone] After hours: [phone] NOTE: If you are having a problem and are unable connect with, or to talk to a provider, then go to your nearest urgent care or emergency department. If the problem is serious and urgent, please call 911.

## 2022-12-27 NOTE — Progress Notes (Signed)
Safety precautions to be maintained throughout the outpatient stay will include: orient to surroundings, keep bed in low position, maintain call bell within reach at all times, provide assistance with transfer out of bed and ambulation.  

## 2022-12-28 ENCOUNTER — Telehealth: Payer: Self-pay

## 2022-12-28 NOTE — Telephone Encounter (Signed)
Post procedure follow up.  LM 

## 2022-12-31 ENCOUNTER — Other Ambulatory Visit: Payer: Self-pay

## 2022-12-31 ENCOUNTER — Other Ambulatory Visit (HOSPITAL_COMMUNITY): Payer: Self-pay

## 2022-12-31 MED ORDER — SODIUM FLUORIDE 1.1 % DT PSTE
1.0000 | PASTE | Freq: Every evening | DENTAL | 0 refills | Status: DC
  Filled 2022-12-31: qty 100, 30d supply, fill #0

## 2023-01-03 ENCOUNTER — Telehealth: Payer: Self-pay

## 2023-01-03 DIAGNOSIS — M47816 Spondylosis without myelopathy or radiculopathy, lumbar region: Secondary | ICD-10-CM

## 2023-01-03 NOTE — Telephone Encounter (Addendum)
Medicare denied auth for the RFA. They want to see 2 recent MBNB with over 80% relief. I know she has had RFA's before but they are pretty strict on having documentation of 2 facets. Do you want to order the MBNB?

## 2023-01-04 DIAGNOSIS — M79642 Pain in left hand: Secondary | ICD-10-CM | POA: Diagnosis not present

## 2023-01-04 DIAGNOSIS — G119 Hereditary ataxia, unspecified: Secondary | ICD-10-CM | POA: Diagnosis not present

## 2023-01-04 DIAGNOSIS — M79641 Pain in right hand: Secondary | ICD-10-CM | POA: Diagnosis not present

## 2023-01-04 DIAGNOSIS — M62838 Other muscle spasm: Secondary | ICD-10-CM | POA: Diagnosis not present

## 2023-01-04 DIAGNOSIS — M542 Cervicalgia: Secondary | ICD-10-CM | POA: Diagnosis not present

## 2023-01-04 DIAGNOSIS — M6289 Other specified disorders of muscle: Secondary | ICD-10-CM | POA: Diagnosis not present

## 2023-01-05 DIAGNOSIS — Z961 Presence of intraocular lens: Secondary | ICD-10-CM | POA: Diagnosis not present

## 2023-01-05 DIAGNOSIS — E11319 Type 2 diabetes mellitus with unspecified diabetic retinopathy without macular edema: Secondary | ICD-10-CM | POA: Diagnosis not present

## 2023-01-05 LAB — HM DIABETES EYE EXAM

## 2023-01-06 ENCOUNTER — Encounter: Payer: Self-pay | Admitting: Family Medicine

## 2023-01-11 ENCOUNTER — Ambulatory Visit (INDEPENDENT_AMBULATORY_CARE_PROVIDER_SITE_OTHER): Payer: Medicare Other | Admitting: Family Medicine

## 2023-01-11 ENCOUNTER — Encounter: Payer: Self-pay | Admitting: Family Medicine

## 2023-01-11 ENCOUNTER — Ambulatory Visit: Payer: Medicare Other

## 2023-01-11 VITALS — BP 136/70 | Ht 70.0 in | Wt 153.4 lb

## 2023-01-11 VITALS — BP 142/82 | HR 71 | Temp 98.6°F | Resp 16 | Ht 70.0 in | Wt 153.2 lb

## 2023-01-11 DIAGNOSIS — Z794 Long term (current) use of insulin: Secondary | ICD-10-CM

## 2023-01-11 DIAGNOSIS — N3941 Urge incontinence: Secondary | ICD-10-CM

## 2023-01-11 DIAGNOSIS — E1142 Type 2 diabetes mellitus with diabetic polyneuropathy: Secondary | ICD-10-CM

## 2023-01-11 DIAGNOSIS — G119 Hereditary ataxia, unspecified: Secondary | ICD-10-CM | POA: Diagnosis not present

## 2023-01-11 DIAGNOSIS — Z1211 Encounter for screening for malignant neoplasm of colon: Secondary | ICD-10-CM

## 2023-01-11 DIAGNOSIS — Z0000 Encounter for general adult medical examination without abnormal findings: Secondary | ICD-10-CM

## 2023-01-11 DIAGNOSIS — Z993 Dependence on wheelchair: Secondary | ICD-10-CM | POA: Diagnosis not present

## 2023-01-11 DIAGNOSIS — M797 Fibromyalgia: Secondary | ICD-10-CM

## 2023-01-11 DIAGNOSIS — Z1231 Encounter for screening mammogram for malignant neoplasm of breast: Secondary | ICD-10-CM

## 2023-01-11 DIAGNOSIS — E785 Hyperlipidemia, unspecified: Secondary | ICD-10-CM | POA: Diagnosis not present

## 2023-01-11 DIAGNOSIS — Z780 Asymptomatic menopausal state: Secondary | ICD-10-CM

## 2023-01-11 DIAGNOSIS — E1042 Type 1 diabetes mellitus with diabetic polyneuropathy: Secondary | ICD-10-CM

## 2023-01-11 MED ORDER — GABAPENTIN 100 MG PO CAPS: 200.0000 mg | ORAL_CAPSULE | Freq: Three times a day (TID) | ORAL | Status: AC

## 2023-01-11 NOTE — Progress Notes (Signed)
Subjective:   Joyce Brown is a 72 y.o. female who presents for Medicare Annual (Subsequent) preventive examination.  Visit Complete: In person   Cardiac Risk Factors include: advanced age (>98men, >6 women);diabetes mellitus;dyslipidemia;sedentary lifestyle     Objective:    Today's Vitals   01/11/23 1305 01/11/23 1310  BP: (!) 150/78   Weight: 153 lb 6.4 oz (69.6 kg)   Height: 5\' 10"  (1.778 m)   PainSc:  4    Body mass index is 22.01 kg/m.     01/11/2023    1:29 PM 12/27/2022    9:15 AM 12/09/2022    1:06 PM 12/31/2021    2:36 PM 08/18/2021    9:42 AM 07/20/2021    8:53 AM 05/18/2021    8:34 AM  Advanced Directives  Does Patient Have a Medical Advance Directive? No Yes Yes No No Yes No  Type of Science writer of [place];Living will   Healthcare Power of [place];Living will   Does patient want to make changes to medical advance directive?  No - Patient declined    No - Patient declined   Copy of Healthcare Power of Attorney in Chart?   No - copy requested      Would patient like information on creating a medical advance directive? No - Patient declined   Yes (MAU/Ambulatory/Procedural Areas - Information given) No - Patient declined  No - Patient declined    Current Medications (verified) Outpatient Encounter Medications as of 01/11/2023  Medication Sig   Acetaminophen 500 MG capsule Take 500 mg by mouth every 6 (six) hours as needed for moderate pain.   antiseptic oral rinse (BIOTENE) LIQD 15 mLs by Mouth Rinse route in the morning, at noon, in the evening, and at bedtime.   atorvastatin (LIPITOR) 80 MG tablet Take 1 tablet (80 mg total) by mouth daily.   BD INSULIN SYRINGE U/F 1/2UNIT 31G X 5/16" 0.3 ML MISC USE 3 TIMES A DAY   carboxymethylcellulose (REFRESH PLUS) 0.5 % SOLN Place 1 drop into both eyes 3 (three) times daily as needed.   Continuous Blood Gluc Sensor (DEXCOM G7 SENSOR) MISC by Does not apply  route.   COVID-19 mRNA vaccine, Pfizer, (COMIRNATY) syringe Inject into the muscle.   docusate sodium (COLACE) 100 MG capsule Take 100 mg by mouth daily as needed for mild constipation.   estradiol (ESTRACE) 0.1 MG/GM vaginal cream Place 1 Applicatorful vaginally 2 (two) times a week.   gabapentin (NEURONTIN) 100 MG capsule Take 1 capsule (100 mg total) by mouth 3 (three) times daily.   glucose blood test strip ASCENSIA CONTOUR NEXT ONE: Use 1 strip to check blood sugar daily   insulin aspart (NOVOLOG PENFILL) cartridge USE MAX OF DAILY DOSE OF 60 UNITS PER CORRECTION SCALE AS DIRECTED (Patient taking differently: USE MAX OF DAILY DOSE OF 60 UNITS PER CORRECTION SCALE AS DIRECTED Taking 8 units before breakfast; 10 units before lunch; 7 units before dinner)   insulin degludec (TRESIBA FLEXTOUCH) 100 UNIT/ML FlexTouch Pen Inject 14 Units into the skin daily. (Patient taking differently: Inject 16 Units into the skin daily.)   Insulin Pen Needle (DROPLET PEN NEEDLES) 31G X 5 MM MISC USE FOUR TIMES A DAY AS DIRECTED BY YOUR DOCTOR   Microlet Lancets MISC 3 (three) times daily.   MIEBO 1.338 GM/ML SOLN Apply 1 drop to eye 4 (four) times daily.   mycophenolate (CELLCEPT) 500 MG tablet Take 2 tablets (1,000 mg total)  by mouth 2 (two) times daily.   MYRBETRIQ 50 MG TB24 tablet Take 50 mg by mouth daily.   Omega 3 1200 MG CAPS Take by mouth. One daily   RSV vaccine recomb adjuvanted (AREXVY) 120 MCG/0.5ML injection Inject into the muscle.   sertraline (ZOLOFT) 100 MG tablet Take 1 tablet (100 mg total) by mouth daily.   Sodium Fluoride 1.1 % PSTE Use 1 Application Nightly after brushing/flossing. Spit out excess. No rinse/eat/drink for at least 30 minutes after application   tiZANidine (ZANAFLEX) 2 MG tablet TAKE 1 TO 2 TABLETS BY MOUTH AT BEDTIME AS NEEDED FOR MUSCLE SPASMS   TWINRIX 720-20 ELU-MCG/ML injection    No facility-administered encounter medications on file as of 01/11/2023.    Allergies  (verified) Patient has no known allergies.   History: Past Medical History:  Diagnosis Date   Arthritis 1995   Have family history of it.   Asthma 1995   Has resolved   Cataract 2017   Family History of them   Cerebellar ataxia (HCC)    Depression 1996   Due to Fibromyalgia.   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   Glaucoma Various   Family History of it.   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   Pulmonary embolism (HCC)    Sleep apnea    Spinal stenosis    Past Surgical History:  Procedure Laterality Date   ABDOMINAL HYSTERECTOMY     ABDOMINAL HYSTERECTOMY  1997   Partial Hysterectomy   ADENOIDECTOMY     ANKLE SURGERY Left    BILATERAL CARPAL TUNNEL RELEASE     BREAST BIOPSY Left 2019   benign   COLON SURGERY  09/25/2012   Gastric Bypass Surgery   ELBOW SURGERY     EYE SURGERY  03/14/22 & 03/17/22   Cataract Surgery   GASTRIC BYPASS  2014   HAND NEUROPLASTY     radio denervation  05/21/2020   SMALL INTESTINE SURGERY  08/18/2013   Laparoscopic Procedure to Relieve Small Bowel Obstruction   TONSILLECTOMY     Family History  Problem Relation Age of Onset   Cervical cancer Mother    Ovarian cancer Mother    Arthritis Mother    Cancer Mother    Dementia Father    Retinal detachment Father    Heart attack Father    Diabetes Father    Stroke Father    Hypertension Father    Alcohol abuse Father    Arthritis Father    Hearing loss Father    Hyperlipidemia Father    Ovarian cancer Sister    Arthritis Sister    Arthritis  Sister    Arthritis Sister    Arthritis Sister    Glaucoma Paternal Grandfather    Arthritis Paternal Grandfather    Depression Paternal Grandfather    Hearing loss Paternal Grandfather    Diabetes Paternal Grandmother    Arthritis Sister    Arthritis Sister    Cancer Sister    Diabetes Maternal Uncle    Vision loss Maternal Uncle  Social History   Socioeconomic History   Marital status: Single    Spouse name: Not on file   Number of children: 0   Years of education: 16   Highest education level: Bachelor's degree (e.g., BA, AB, BS)  Occupational History   Occupation: Retired  Tobacco Use   Smoking status: Never    Passive exposure: Never   Smokeless tobacco: Never  Vaping Use   Vaping status: Never Used  Substance and Sexual Activity   Alcohol use: Never   Drug use: Never   Sexual activity: Not Currently  Other Topics Concern   Not on file  Social History Narrative   Patient lives alone in [place].    Patient has 1 cat- Monica- Madaline Guthrie passed.   Patient has 4 sisters- [place], [place], [place], and [place].   Patient has been walking around her complex for exercise using wheelchair.    Social Determinants of Health   Financial Resource Strain: Low Risk  (01/11/2023)   Overall Financial Resource Strain (CARDIA)    Difficulty of Paying Living Expenses: Not hard at all  Recent Concern: Financial Resource Strain - Medium Risk (01/07/2023)   Overall Financial Resource Strain (CARDIA)    Difficulty of Paying Living Expenses: Somewhat hard  Food Insecurity: No Food Insecurity (01/11/2023)   Hunger Vital Sign    Worried About Running Out of Food in the Last Year: Never true    Ran Out of Food in the Last Year: Never true  Recent Concern: Food Insecurity - Food Insecurity Present (01/07/2023)   Hunger Vital Sign    Worried About Running Out of Food in the Last Year: Sometimes true    Ran Out of Food in the Last Year: Never true  Transportation Needs: No  Transportation Needs (01/11/2023)   PRAPARE - Administrator, Civil Service (Medical): No    Lack of Transportation (Non-Medical): No  Physical Activity: Insufficiently Active (01/11/2023)   Exercise Vital Sign    Days of Exercise per Week: 2 days    Minutes of Exercise per Session: 30 min  Stress: No Stress Concern Present (01/11/2023)   [employer] of Occupational Health - Occupational Stress Questionnaire    Feeling of Stress : Not at all  Social Connections: Socially Isolated (01/11/2023)   Social Connection and Isolation Panel [NHANES]    Frequency of Communication with Friends and Family: Three times a week    Frequency of Social Gatherings with Friends and Family: Never    Attends Religious Services: Never    Database administrator or Organizations: No    Attends Engineer, structural: Never    Marital Status: Never married    Tobacco Counseling Counseling given: Not Answered   Clinical Intake:  Pre-visit preparation completed: Yes  Pain : 0-10 Pain Score: 4  Pain Type: Chronic pain Pain Location: Hand Pain Orientation: Right Pain Descriptors / Indicators: Aching, Throbbing Pain Onset: More than a month ago Pain Frequency: Intermittent     Nutritional Status: BMI of 19-24  Normal Nutritional Risks: None Diabetes: Yes CBG done?: No Did pt. bring in CBG monitor from home?: No  How often do you need to have someone help you when you read instructions, pamphlets, or other written materials from your doctor or pharmacy?: 1 - Never  Interpreter Needed?: No  Information entered by :: Kennedy Bucker, LPN   Activities of Daily Living    01/11/2023    1:30 PM 01/07/2023    9:06 AM  In your present state of health, do you have any difficulty performing the following activities:  Hearing? 1 0  Vision? 0 0  Difficulty concentrating or making decisions? 0 0  Walking or climbing stairs? 1 1  Comment cannot   Dressing or bathing? 0 0   Doing errands, shopping? 0 0  Preparing Food and eating ? N N  Using the Toilet? N N  In the past six months, have you accidently leaked urine? N Y  Do you have problems with loss of bowel control? N N  Managing your Medications? N N  Managing your Finances? N N  Housekeeping or managing your Housekeeping? N N    Patient Care Team: Ronnald Ramp, MD as PCP - General (Family Medicine) Edward Jolly, MD as Consulting Physician (Pain Medicine) Simon Rhein, MD (Endocrinology) Sallee Lange, MD (Ophthalmology) Meda Coffee Madelaine Etienne, MD (Emergency Medicine) Fransico Michael, MD as Referring Physician (Audiology) Morene Crocker, MD as Referring Physician (Neurology) Debria Garret, DMD (Dentistry) Darrick Grinder, RD as Dietitian (Dietician)  Indicate any recent Medical Services you may have received from other than [facility] providers in the past year (date may be approximate).     Assessment:   This is a routine wellness examination for [employer].  Hearing/Vision screen Hearing Screening - Comments:: Wears aids, both ears Vision Screening - Comments:: Wears glasss- Dr. Dellie Burns   Goals Addressed             This Visit's Progress    DIET - EAT MORE FRUITS AND VEGETABLES         Depression Screen    01/11/2023    1:26 PM 12/27/2022    9:15 AM 12/09/2022    1:05 PM 08/17/2022    8:42 AM 07/19/2022    9:19 AM 12/31/2021    2:33 PM 09/23/2021    8:54 AM  PHQ 2/9 Scores  PHQ - 2 Score 0 0 1 2 0 0 0  PHQ- 9 Score 0   5       Fall Risk    01/11/2023    1:30 PM 01/07/2023    9:06 AM 12/27/2022    9:14 AM 12/09/2022    1:04 PM 08/17/2022    8:42 AM  Fall Risk   Falls in the past year? 1 1 1 1  0  Number falls in past yr: 1 1 1 1  0  Injury with Fall? 0 0 1 1 0  Comment   "sore tailbone"    Risk for fall due to : History of fall(s);Impaired mobility   History of fall(s)     MEDICARE RISK AT HOME: Medicare Risk at Home Any stairs in or around the  home?: No If so, are there any without handrails?: No Home free of loose throw rugs in walkways, pet beds, electrical cords, etc?: Yes Adequate lighting in your home to reduce risk of falls?: Yes Life alert?: No Use of a cane, walker or w/c?: Yes (walker or w/c, mainly w/c) Grab bars in the bathroom?: Yes Shower chair or bench in shower?: Yes Elevated toilet seat or a handicapped toilet?: Yes  TIMED UP AND GO:  Was the test performed?  Yes  Length of time to ambulate 10 feet: 8 sec Gait slow and steady with assistive device    Cognitive Function:        01/11/2023    1:34 PM 12/31/2021    2:38 PM 11/25/2020   10:07 AM  6CIT Screen  What Year? 0  points 0 points 0 points  What month? 0 points 0 points 0 points  What time? 0 points 0 points 0 points  Count back from 20 0 points 0 points 0 points  Months in reverse 0 points 0 points 0 points  Repeat phrase 0 points 0 points 0 points  Total Score 0 points 0 points 0 points    Immunizations Immunization History  Administered Date(s) Administered   Fluad Quad(high Dose 65+) 11/20/2019, 12/16/2020   Fluad Trivalent(High Dose 65+) 11/10/2022   Influenza, High Dose Seasonal PF 01/06/2017   Influenza, Seasonal, Injecte, Preservative Fre 12/15/2007, 12/16/2008, 12/15/2009, 12/18/2010, 12/20/2011, 01/04/2013   Influenza,inj,Quad PF,6+ Mos 12/28/2013, 01/02/2015, 01/01/2016, 12/06/2017, 11/15/2018   Influenza,trivalent, recombinat, inj, PF 01/06/2017   Influenza-Unspecified 12/15/2007, 12/16/2008, 12/15/2009, 12/18/2010, 12/20/2011, 01/04/2013, 12/14/2021   PFIZER Comirnaty(Gray Top)Covid-19 Tri-Sucrose Vaccine 07/01/2020   PFIZER(Purple Top)SARS-COV-2 Vaccination 04/29/2019, 05/22/2019, 01/23/2020   PNEUMOCOCCAL CONJUGATE-20 12/31/2021   Pfizer(Comirnaty)Fall Seasonal Vaccine 12 years and older 01/01/2022, 12/16/2022   Pneumococcal Conjugate-13 03/20/2018   Pneumococcal Polysaccharide-23 02/12/2005, 07/22/2016   Respiratory  Syncytial Virus Vaccine,Recomb Aduvanted(Arexvy) 12/16/2022   Td 02/17/2007   Td (Adult),5 Lf Tetanus Toxid, Preservative Free 02/17/2007   Tdap 11/23/2013, 07/25/2019   Unspecified SARS-COV-2 Vaccination 12/24/2020, 01/01/2022   Zoster Recombinant(Shingrix) 07/21/2017, 11/04/2017    TDAP status: Up to date  Flu Vaccine status: Up to date  Pneumococcal vaccine status: Up to date  Covid-19 vaccine status: Completed vaccines  Qualifies for Shingles Vaccine? Yes   Zostavax completed No   Shingrix Completed?: Yes  Screening Tests Health Maintenance  Topic Date Due   Colonoscopy  03/16/2023   COVID-19 Vaccine (8 - 2023-24 season) 02/10/2023   HEMOGLOBIN A1C  03/11/2023   FOOT EXAM  03/27/2023   MAMMOGRAM  10/15/2023   OPHTHALMOLOGY EXAM  01/05/2024   DTaP/Tdap/Td (5 - Td or Tdap) 07/24/2029   Pneumonia Vaccine 34+ Years old  Completed   INFLUENZA VACCINE  Completed   DEXA SCAN  Completed   Zoster Vaccines- Shingrix  Completed   HPV VACCINES  Aged Out    Health Maintenance  Health Maintenance Due  Topic Date Due   Colonoscopy  03/16/2023    Colorectal cancer screening: Type of screening: Colonoscopy. Completed 03/15/13. Repeat every 10 years  Mammogram status: Ordered 09/01/22. Pt provided with contact info and advised to call to schedule appt.   Bone Density status: Completed 02/08/17. Results reflect: Bone density results: NORMAL. Repeat every 5 years.  Lung Cancer Screening: (Low Dose CT Chest recommended if Age 77-80 years, 20 pack-year currently smoking OR have quit w/in 15years.) does not qualify.    Additional Screening:  Hepatitis C Screening: does qualify; Completed no  Vision Screening: Recommended annual ophthalmology exams for early detection of glaucoma and other disorders of the eye. Is the patient up to date with their annual eye exam?  Yes  Who is the provider or what is the name of the office in which the patient attends annual eye exams?  Dr.Dingeldein If pt is not established with a provider, would they like to be referred to a provider to establish care? No .   Dental Screening: Recommended annual dental exams for proper oral hygiene   Community Resource Referral / Chronic Care Management: CRR required this visit?  No   CCM required this visit?  No     Plan:     I have personally reviewed and noted the following in the patient's chart:   Medical and social history Use of alcohol,  tobacco or illicit drugs  Current medications and supplements including opioid prescriptions. Patient is not currently taking opioid prescriptions. Functional ability and status Nutritional status Physical activity Advanced directives List of other physicians Hospitalizations, surgeries, and ER visits in previous 12 months Vitals Screenings to include cognitive, depression, and falls Referrals and appointments  In addition, I have reviewed and discussed with patient certain preventive protocols, quality metrics, and best practice recommendations. A written personalized care plan for preventive services as well as general preventive health recommendations were provided to patient.     Hal Hope, LPN   [id number]   After Visit Summary: (In Person-Declined) Patient declined AVS at this time.- sent to mychart  Nurse Notes: none

## 2023-01-11 NOTE — Progress Notes (Addendum)
Established patient visit   Patient: Joyce Brown   DOB: 1950-03-24   72 y.o. Female  MRN: [id number] Visit Date: 01/11/2023  Today's healthcare provider: Ronnald Ramp, MD   Chief Complaint  Patient presents with   Annual Exam    Blood sugar is currently 346   Subjective     HPI     Annual Exam    Additional comments: Blood sugar is currently 346      Last edited by Clois Comber on 01/11/2023  2:19 PM.       Discussed the use of AI scribe software for clinical note transcription with the patient, who gave verbal consent to proceed.  History of Present Illness   The patient, with a history of diabetes, neuropathy, and a post-bariatric surgery status, presents for a chronic follow-up. She reports a recent increase in her gabapentin dosage and a decrease in her mycophenolate dosage, as advised by her neurologist, Dr. Malvin Johns. The patient is scheduled for Omnipod 5 training for better diabetes management, as her recent blood glucose levels have been consistently high, with a recent reading of 346. She hopes that better blood sugar control will decrease her polydipsia and polyuria.  The patient also reports a recent visit to the bariatric surgeon, who did not recommend any follow-up after conducting a few blood tests. She has been seeing this surgeon for about four years.  In addition to these, the patient is due for a colonoscopy, bone density test, and mammogram.  The patient is actively trying to manage her carbohydrate intake and insulin dosage, keeping a detailed record of her meals and NovoLog units. She has also signed up for a gym and plans to attend twice a week once her appointments settle down.  The patient's ophthalmologist reports that her eyes are doing well.         Past Medical History:  Diagnosis Date   Arthritis 1995   Have family history of it.   Asthma 1995   Has resolved   Cataract 2017   Family History of them    Cerebellar ataxia (HCC)    Depression 1996   Due to Fibromyalgia.   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   Glaucoma Various   Family History of it.   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   antiseptic oral rinse (BIOTENE) LIQD 15 mLs by Mouth Rinse route in the morning, at noon, in the evening, and at bedtime.   atorvastatin (LIPITOR) 80 MG tablet Take 1 tablet (80 mg total) by mouth daily.   BD INSULIN SYRINGE U/F 1/2UNIT 31G X 5/16" 0.3 ML MISC USE 3 TIMES A DAY   carboxymethylcellulose (REFRESH PLUS) 0.5 % SOLN Place 1 drop into both eyes 3 (three) times  daily as needed.   Continuous Blood Gluc Sensor (DEXCOM G7 SENSOR) MISC by Does not apply route.   COVID-19 mRNA vaccine, Pfizer, (COMIRNATY) syringe Inject into the muscle.   docusate sodium (COLACE) 100 MG capsule Take 100 mg by mouth daily as needed for mild constipation.   estradiol (ESTRACE) 0.1 MG/GM vaginal cream Place 1 Applicatorful vaginally 2 (two) times a week.   glucose blood test strip ASCENSIA CONTOUR NEXT ONE: Use 1 strip to check blood sugar daily   insulin aspart (NOVOLOG PENFILL) cartridge USE MAX OF DAILY DOSE OF 60 UNITS PER CORRECTION SCALE AS DIRECTED (Patient taking differently: USE MAX OF DAILY DOSE OF 60 UNITS PER CORRECTION SCALE AS  DIRECTED Taking 8 units before breakfast; 10 units before lunch; 7 units before dinner)   insulin degludec (TRESIBA FLEXTOUCH) 100 UNIT/ML FlexTouch Pen Inject 14 Units into the skin daily. (Patient taking differently: Inject 16 Units into the skin daily.)   Insulin Pen Needle (DROPLET PEN NEEDLES) 31G X 5 MM MISC USE FOUR TIMES A DAY AS DIRECTED BY YOUR DOCTOR   Microlet Lancets MISC 3 (three) times daily.   MIEBO 1.338 GM/ML SOLN Apply 1 drop to eye 4 (four) times daily.   mycophenolate (CELLCEPT) 500 MG tablet Take 2 tablets (1,000 mg total) by mouth 2 (two) times daily. (Patient taking differently: Take 500 mg by mouth 2 (two) times daily.)   MYRBETRIQ 50 MG TB24 tablet Take 50 mg by mouth daily.   Omega 3 1200 MG CAPS Take by mouth. One daily   RSV vaccine recomb adjuvanted (AREXVY) 120 MCG/0.5ML injection Inject into the muscle.   sertraline (ZOLOFT) 100 MG tablet Take 1 tablet (100 mg total) by mouth daily.   Sodium Fluoride 1.1 % PSTE Use 1 Application Nightly after brushing/flossing. Spit out excess. No rinse/eat/drink for at least 30 minutes after application   tiZANidine (ZANAFLEX) 2 MG tablet TAKE 1 TO 2 TABLETS BY MOUTH AT BEDTIME AS NEEDED FOR MUSCLE SPASMS   TWINRIX 720-20 ELU-MCG/ML injection    [DISCONTINUED] gabapentin (NEURONTIN) 100 MG capsule Take 1 capsule (100 mg total) by mouth 3 (three) times daily.   No facility-administered medications prior to visit.    Review of Systems      Objective    BP (!) 142/82 (BP Location: Right Arm, Patient Position: Sitting, Cuff Size: Normal)   Pulse 71   Temp 98.6 F (37 C)   Resp 16   Ht 5\' 10"  (1.778 m)   Wt 153 lb 3.2 oz (69.5 kg)   SpO2 97%   BMI 21.98 kg/m      Wt Readings from Last 3 Encounters:  01/11/23 153 lb 3.2 oz (69.5 kg)  01/11/23 153 lb 6.4 oz (69.6 kg)  12/27/22 171 lb (77.6 kg)   BP Readings from Last 3 Encounters:  01/11/23 (!) 142/82  01/11/23 136/70  12/27/22 (!) 151/79      Chemistry       Component Value Date/Time   NA 141 06/09/2022 1103   K 4.4 06/09/2022 1103   CL 105 06/09/2022 1103   CO2 22 06/09/2022 1103   BUN 27 06/09/2022 1103   CREATININE 0.85 06/09/2022 1103      Component Value Date/Time   CALCIUM 9.5 06/09/2022 1103   ALKPHOS 76 06/09/2022 1103   AST 26 06/09/2022 1103   ALT 20 06/09/2022 1103   BILITOT 0.3 06/09/2022 1103      Lab Results  Component Value Date   HGBA1C 8.2 (A)  09/09/2022   Lab Results  Component Value Date   CHOL 139 09/09/2022   HDL 46.60 09/09/2022   LDLCALC 72 09/09/2022   TRIG 104.0 09/09/2022   CHOLHDL 3 09/09/2022    Physical Exam Constitutional:      Comments: Elderly pleasant female in NAD seated in wheelchair in exam room, well groomed  Cardiovascular:     Rate and Rhythm: Normal rate and regular rhythm.  Pulmonary:     Effort: Pulmonary effort is normal. No respiratory distress.     Breath sounds: No wheezing, rhonchi or rales.  Neurological:     Mental Status: She is alert.     Comments: Uses wheelchair      Physical Exam   HEENT: Tongue dry. Pupils reactive.       No results found for any visits on 01/11/23.  Assessment & Plan     Problem List Items Addressed This Visit       Endocrine   Type 1 diabetes mellitus with diabetic polyneuropathy (HCC)   Relevant Medications   gabapentin (NEURONTIN) 100 MG capsule   Diabetic neuropathy (HCC)     Nervous and Auditory   Cerebellar ataxia (HCC)     Other   Wheelchair dependence   Urge incontinence of urine   Fibromyalgia   Relevant Medications   gabapentin (NEURONTIN) 100 MG capsule   Dyslipidemia - Primary   Other Visit Diagnoses     Screening for colon cancer           Diabetes Mellitus, Type one  Not yet at goal  Elevated blood sugars (recent A1c 8.4) despite current management. Patient is scheduled for Omnipod 5 training on 01/24/2023. Increased polyuria likely secondary to hyperglycemia. -Continue current diabetes management and  proceed with Omnipod 5 training. -continue to follow up with endocrinology as scheduled   Neurological Disorder Under the care of Dr. Malvin Johns. Current plan to wean off mycophenolate and increase gabapentin to 200mg  TID. -Continue current management under Dr. Malvin Johns.  Hyperlipidemia,chronic Currently managed with atorvastatin 80mg  daily. -Continue atorvastatin 80mg  daily.   General Health Maintenance -Completed annual wellness visit and received COVID and RSV vaccines. -Scheduled for colonoscopy, bone density scan, and mammogram. -Plan to follow up in four months or sooner if needed.         Return in about 4 months (around 05/13/2023) for CHRONIC F/U.         Ronnald Ramp, MD  [facility] [phone] (phone) [phone] (fax)  [facility]

## 2023-01-11 NOTE — Patient Instructions (Addendum)
Joyce Brown , Thank you for taking time to come for your Medicare Wellness Visit. I appreciate your ongoing commitment to your health goals. Please review the following plan we discussed and let me know if I can assist you in the future.   Referrals/Orders/Follow-Ups/Clinician Recommendations:  made for colonoscopy, mammogram, and bone density scan  This is a list of the screening recommended for you and due dates:  Health Maintenance  Topic Date Due   Colon Cancer Screening  03/16/2023   COVID-19 Vaccine (8 - 2023-24 season) 02/10/2023   Hemoglobin A1C  03/11/2023   Complete foot exam   03/27/2023   Mammogram  10/15/2023   Eye exam for diabetics  01/05/2024   DTaP/Tdap/Td vaccine (5 - Td or Tdap) 07/24/2029   Pneumonia Vaccine  Completed   Flu Shot  Completed   DEXA scan (bone density measurement)  Completed   Zoster (Shingles) Vaccine  Completed   HPV Vaccine  Aged Out    Advanced directives: (ACP Link)Information on Advanced Care Planning can be found at [facility] Advance Health Care Directives Advance Health Care Directives ([email or web address]   Next Medicare Annual Wellness Visit scheduled for next year: Yes   01/17/24 @ 1:00 pm in person

## 2023-01-12 ENCOUNTER — Other Ambulatory Visit: Payer: Self-pay | Admitting: Family Medicine

## 2023-01-12 ENCOUNTER — Telehealth: Payer: Self-pay

## 2023-01-12 DIAGNOSIS — Z1231 Encounter for screening mammogram for malignant neoplasm of breast: Secondary | ICD-10-CM

## 2023-01-12 NOTE — Telephone Encounter (Signed)
Patient called to schedule her colonoscopy °

## 2023-01-14 ENCOUNTER — Telehealth: Payer: Self-pay

## 2023-01-14 ENCOUNTER — Other Ambulatory Visit: Payer: Self-pay

## 2023-01-14 DIAGNOSIS — Z1211 Encounter for screening for malignant neoplasm of colon: Secondary | ICD-10-CM

## 2023-01-14 MED ORDER — NA SULFATE-K SULFATE-MG SULF 17.5-3.13-1.6 GM/177ML PO SOLN
1.0000 | Freq: Once | ORAL | 0 refills | Status: AC
Start: 2023-01-14 — End: 2023-01-14

## 2023-01-14 NOTE — Telephone Encounter (Signed)
Gastroenterology Pre-Procedure Review  Request Date: 02/14/23 Requesting Physician: Dr. Allegra Lai  PATIENT REVIEW QUESTIONS: The patient responded to the following health history questions as indicated:    1. Are you having any GI issues? no 2. Do you have a personal history of Polyps? no 3. Do you have a family history of Colon Cancer or Polyps? no 4. Diabetes Mellitus? yes (takes triseba and novolog has been advised to take half of the usual amount the evening before colonoscopy) 5. Joint replacements in the past 12 months?no 6. Major health problems in the past 3 months?no 7. Any artificial heart valves, MVP, or defibrillator?no    MEDICATIONS & ALLERGIES:    Patient reports the following regarding taking any anticoagulation/antiplatelet therapy:   Plavix, Coumadin, Eliquis, Xarelto, Lovenox, Pradaxa, Brilinta, or Effient? no Aspirin? no  Patient confirms/reports the following medications:  Current Outpatient Medications  Medication Sig Dispense Refill   Acetaminophen 500 MG capsule Take 500 mg by mouth every 6 (six) hours as needed for moderate pain.     antiseptic oral rinse (BIOTENE) LIQD 15 mLs by Mouth Rinse route in the morning, at noon, in the evening, and at bedtime.     atorvastatin (LIPITOR) 80 MG tablet Take 1 tablet (80 mg total) by mouth daily. 90 tablet 3   BD INSULIN SYRINGE U/F 1/2UNIT 31G X 5/16" 0.3 ML MISC USE 3 TIMES A DAY 100 each 3   carboxymethylcellulose (REFRESH PLUS) 0.5 % SOLN Place 1 drop into both eyes 3 (three) times daily as needed.     Continuous Blood Gluc Sensor (DEXCOM G7 SENSOR) MISC by Does not apply route.     COVID-19 mRNA vaccine, Pfizer, (COMIRNATY) syringe Inject into the muscle. 0.3 mL 0   docusate sodium (COLACE) 100 MG capsule Take 100 mg by mouth daily as needed for mild constipation.     estradiol (ESTRACE) 0.1 MG/GM vaginal cream Place 1 Applicatorful vaginally 2 (two) times a week.     gabapentin (NEURONTIN) 100 MG capsule Take 2  capsules (200 mg total) by mouth 3 (three) times daily.     glucose blood test strip ASCENSIA CONTOUR NEXT ONE: Use 1 strip to check blood sugar daily 100 each 2   insulin aspart (NOVOLOG PENFILL) cartridge USE MAX OF DAILY DOSE OF 60 UNITS PER CORRECTION SCALE AS DIRECTED (Patient taking differently: USE MAX OF DAILY DOSE OF 60 UNITS PER CORRECTION SCALE AS DIRECTED Taking 8 units before breakfast; 10 units before lunch; 7 units before dinner) 60 mL 2   insulin degludec (TRESIBA FLEXTOUCH) 100 UNIT/ML FlexTouch Pen Inject 14 Units into the skin daily. (Patient taking differently: Inject 16 Units into the skin daily.) 15 mL 3   Insulin Pen Needle (DROPLET PEN NEEDLES) 31G X 5 MM MISC USE FOUR TIMES A DAY AS DIRECTED BY YOUR DOCTOR 200 each 5   Microlet Lancets MISC 3 (three) times daily.     MIEBO 1.338 GM/ML SOLN Apply 1 drop to eye 4 (four) times daily.     mycophenolate (CELLCEPT) 500 MG tablet Take 2 tablets (1,000 mg total) by mouth 2 (two) times daily. (Patient taking differently: Take 500 mg by mouth 2 (two) times daily.) 360 tablet 3   MYRBETRIQ 50 MG TB24 tablet Take 50 mg by mouth daily.     Omega 3 1200 MG CAPS Take by mouth. One daily     RSV vaccine recomb adjuvanted (AREXVY) 120 MCG/0.5ML injection Inject into the muscle. 0.5 mL 0   sertraline (ZOLOFT) 100  MG tablet Take 1 tablet (100 mg total) by mouth daily. 90 tablet 2   Sodium Fluoride 1.1 % PSTE Use 1 Application Nightly after brushing/flossing. Spit out excess. No rinse/eat/drink for at least 30 minutes after application 100 mL 0   tiZANidine (ZANAFLEX) 2 MG tablet TAKE 1 TO 2 TABLETS BY MOUTH AT BEDTIME AS NEEDED FOR MUSCLE SPASMS 360 tablet 2   TWINRIX 720-20 ELU-MCG/ML injection      No current facility-administered medications for this visit.    Patient confirms/reports the following allergies:  No Known Allergies  No orders of the defined types were placed in this encounter.   AUTHORIZATION INFORMATION Primary  Insurance: 1D#: Group #:  Secondary Insurance: 1D#: Group #:  SCHEDULE INFORMATION: Date: 02/14/23 Time: Location: [facility]

## 2023-01-14 NOTE — Telephone Encounter (Signed)
Colonoscopy has been rescheduled from 12/02 to 12/03.  Trish in Endo notified.  Thanks,  [place], [place]

## 2023-01-18 ENCOUNTER — Telehealth: Payer: Self-pay

## 2023-01-18 NOTE — Telephone Encounter (Signed)
Pt wanted me to let you know her surgery history Gastric bypass 10 years and laparoscopic procedure 2015.  Thanks, [place], [place]

## 2023-01-18 NOTE — Telephone Encounter (Signed)
Do you want to order MBNB?

## 2023-01-19 ENCOUNTER — Ambulatory Visit: Payer: Medicare Other | Admitting: Podiatry

## 2023-01-26 ENCOUNTER — Other Ambulatory Visit: Payer: Self-pay | Admitting: Internal Medicine

## 2023-01-26 ENCOUNTER — Telehealth: Payer: Self-pay

## 2023-01-26 DIAGNOSIS — E1065 Type 1 diabetes mellitus with hyperglycemia: Secondary | ICD-10-CM | POA: Diagnosis not present

## 2023-01-26 DIAGNOSIS — Z9641 Presence of insulin pump (external) (internal): Secondary | ICD-10-CM | POA: Diagnosis not present

## 2023-01-26 DIAGNOSIS — Z9884 Bariatric surgery status: Secondary | ICD-10-CM | POA: Diagnosis not present

## 2023-01-27 ENCOUNTER — Telehealth: Payer: Self-pay

## 2023-01-27 ENCOUNTER — Ambulatory Visit: Payer: Medicare Other | Admitting: Podiatry

## 2023-01-27 NOTE — Telephone Encounter (Signed)
Patient contacted the office to make sure that her colonoscopy is 12/03 not 12/02.  Informed her that her colonoscopy is on 02/15/23.  The instructions were prepared for the first date originally but new instructions should be received in the mail as well, however if they do not they are in [place] and to adjust the dates on her current instructions.  She said she just got her insulin pump and its working well.  Her doctor has advised her to not pause her insulin pump. I told her that is correct because it is self monitoring.  Asked her to call me back if she has any questions.  Thanks,  Marcelino Duster CMA

## 2023-02-03 ENCOUNTER — Telehealth: Payer: Self-pay

## 2023-02-03 NOTE — Telephone Encounter (Signed)
Dr. Allegra Lai is on [place] on 02/15/2023 and not going to be able to scope that day. Called patient and reschedule patient to 03/01/2023. Mailed out new instructions to patient. Called endo and informed Vickie and she states she will get patient moved.

## 2023-02-04 ENCOUNTER — Ambulatory Visit (INDEPENDENT_AMBULATORY_CARE_PROVIDER_SITE_OTHER): Payer: Medicare Other | Admitting: Podiatry

## 2023-02-04 ENCOUNTER — Telehealth: Payer: Self-pay

## 2023-02-04 ENCOUNTER — Encounter: Payer: Self-pay | Admitting: Podiatry

## 2023-02-04 VITALS — Ht 70.0 in | Wt 153.2 lb

## 2023-02-04 DIAGNOSIS — B351 Tinea unguium: Secondary | ICD-10-CM

## 2023-02-04 DIAGNOSIS — M79674 Pain in right toe(s): Secondary | ICD-10-CM | POA: Diagnosis not present

## 2023-02-04 DIAGNOSIS — E1042 Type 1 diabetes mellitus with diabetic polyneuropathy: Secondary | ICD-10-CM

## 2023-02-04 DIAGNOSIS — M79675 Pain in left toe(s): Secondary | ICD-10-CM | POA: Diagnosis not present

## 2023-02-04 NOTE — Telephone Encounter (Signed)
Patient lvm requesting to reschedule her colonoscopy to one of the following 3 dates 12/26, 12/30 or 12/20.  Unfortunately none of those dates are available.  Voice message has been left for her I've  to call me back to discuss.  Thanks,  [place], [place]

## 2023-02-07 NOTE — Telephone Encounter (Signed)
Patient returned phone call to reschedule her colonoscopy.  Colonoscopy has been rescheduled to 03/24/23.  Laquita in Endo notified of date change.  Referral updated. Instructions updated.  Thanks,  [place], [place]

## 2023-02-09 ENCOUNTER — Ambulatory Visit
Admission: RE | Admit: 2023-02-09 | Discharge: 2023-02-09 | Disposition: A | Discharge status: Home / Self Care | Payer: Medicare Other | Source: Ambulatory Visit | Attending: Student in an Organized Health Care Education/Training Program | Admitting: Student in an Organized Health Care Education/Training Program

## 2023-02-09 ENCOUNTER — Ambulatory Visit
Payer: Medicare Other | Attending: Student in an Organized Health Care Education/Training Program | Admitting: Student in an Organized Health Care Education/Training Program

## 2023-02-09 ENCOUNTER — Encounter: Payer: Self-pay | Admitting: Student in an Organized Health Care Education/Training Program

## 2023-02-09 DIAGNOSIS — M47816 Spondylosis without myelopathy or radiculopathy, lumbar region: Secondary | ICD-10-CM | POA: Diagnosis not present

## 2023-02-09 MED ORDER — DEXAMETHASONE SODIUM PHOSPHATE 10 MG/ML IJ SOLN
20.0000 mg | Freq: Once | INTRAMUSCULAR | Status: AC
  Administered 2023-02-09: 20 mg
  Filled 2023-02-09: qty 2

## 2023-02-09 MED ORDER — LIDOCAINE HCL 2 % IJ SOLN
20.0000 mL | Freq: Once | INTRAMUSCULAR | Status: AC
  Administered 2023-02-09: 200 mg
  Filled 2023-02-09: qty 40

## 2023-02-09 MED ORDER — ROPIVACAINE HCL 2 MG/ML IJ SOLN
18.0000 mL | Freq: Once | INTRAMUSCULAR | Status: AC
  Administered 2023-02-09: 18 mL via PERINEURAL
  Filled 2023-02-09: qty 20

## 2023-02-09 NOTE — Progress Notes (Signed)
Safety precautions to be maintained throughout the outpatient stay will include: orient to surroundings, keep bed in low position, maintain call bell within reach at all times, provide assistance with transfer out of bed and ambulation.  

## 2023-02-09 NOTE — Patient Instructions (Signed)

## 2023-02-09 NOTE — Progress Notes (Signed)
PROVIDER NOTE: Interpretation of information contained herein should be left to medically-trained personnel. Specific patient instructions are provided elsewhere under "Patient Instructions" section of medical record. This document was created in part using STT-dictation technology, any transcriptional errors that may result from this process are unintentional.  Patient: Joyce Brown Type: Established DOB: 03/25/1950 MRN: [id number] PCP: Ronnald Ramp, MD  Service: Procedure DOS: 02/09/2023 Setting: Ambulatory Location: Ambulatory outpatient facility Delivery: Face-to-face Provider: Edward Jolly, MD Specialty: Interventional Pain Management Specialty designation: 09 Location: Outpatient facility Ref. Prov.: Edward Jolly, MD       Interventional Therapy   Type: Lumbar Facet, Medial Branch Block(s) (w/ fluoroscopic mapping) #1  Laterality: Bilateral  Level: L3, L4, and L5 Medial Branch Level(s). Injecting these levels blocks the L3-4 and L4-5 lumbar facet joints. Imaging: Fluoroscopic guidance         Anesthesia: Local anesthesia (1-2% Lidocaine) Sedation: No Sedation                       DOS: 02/09/2023 Performed by: Edward Jolly, MD  Primary Purpose: Diagnostic/Therapeutic Indications: Low back pain severe enough to impact quality of life or function. 1. Lumbar spondylosis   2. Lumbar facet arthropathy    NAS-11 Pain score:   Pre-procedure: 5 /10   Post-procedure: 8  (when standing up to an 8)/10     Position / Prep / Materials:  Position: Prone  Prep solution: ChloraPrep (2% chlorhexidine gluconate and 70% isopropyl alcohol) Area Prepped: Posterolateral Lumbosacral Spine (Wide prep: From the lower border of the scapula down to the end of the tailbone and from flank to flank.)  Materials:  Tray: Block Needle(s):  Type: Spinal  Gauge (G): 22  Length: 3.5-in Qty: 2      H&P (Pre-op Assessment):  Joyce Brown is a 72 y.o. (year old), female patient,  seen today for interventional treatment. She  has a past surgical history that includes Gastric bypass (2014); Ankle surgery (Left); Bilateral carpal tunnel release; Adenoidectomy; Elbow surgery; Hand neuroplasty; Tonsillectomy; Abdominal hysterectomy; Breast biopsy (Left, 2019); radio denervation (05/21/2020); Small intestine surgery (08/18/2013); Eye surgery (03/14/22 & 03/17/22); Colon surgery (09/25/2012); and Abdominal hysterectomy (1997). Joyce Brown has a current medication list which includes the following prescription(s): acetaminophen, antiseptic oral rinse, atorvastatin, bd insulin syringe u/f 1/2unit, carboxymethylcellulose, dexcom g7 sensor, comirnaty, docusate sodium, estradiol, gabapentin, glucose blood, novolog penfill, droplet pen needles, microlet lancets, miebo, myrbetriq, omega 3, arexvy, sertraline, sodium fluoride, tizanidine, twinrix, tresiba flextouch, and mycophenolate. Her primarily concern today is the Back Pain  Initial Vital Signs:  Pulse/HCG Rate: 70ECG Heart Rate: 70 Temp: (!) 97.4 F (36.3 C) Resp: 17 BP: (!) 153/77 SpO2: 99 %  BMI: Estimated body mass index is 24.97 kg/m as calculated from the following:   Height as of this encounter: 5\' 10"  (1.778 m).   Weight as of this encounter: 174 lb (78.9 kg).  Risk Assessment: Allergies: Reviewed. She has No Known Allergies.  Allergy Precautions: None required Coagulopathies: Reviewed. None identified.  Blood-thinner therapy: None at this time Active Infection(s): Reviewed. None identified. Ms. Herlong is afebrile  Site Confirmation: Joyce Brown was asked to confirm the procedure and laterality before marking the site Procedure checklist: Completed Consent: Before the procedure and under the influence of no sedative(s), amnesic(s), or anxiolytics, the patient was informed of the treatment options, risks and possible complications. To fulfill our ethical and legal obligations, as recommended by the American Medical  Association's Code of Ethics, I have informed the  patient of my clinical impression; the nature and purpose of the treatment or procedure; the risks, benefits, and possible complications of the intervention; the alternatives, including doing nothing; the risk(s) and benefit(s) of the alternative treatment(s) or procedure(s); and the risk(s) and benefit(s) of doing nothing. The patient was provided information about the general risks and possible complications associated with the procedure. These may include, but are not limited to: failure to achieve desired goals, infection, bleeding, organ or nerve damage, allergic reactions, paralysis, and death. In addition, the patient was informed of those risks and complications associated to Spine-related procedures, such as failure to decrease pain; infection (i.e.: Meningitis, epidural or intraspinal abscess); bleeding (i.e.: epidural hematoma, subarachnoid hemorrhage, or any other type of intraspinal or peri-dural bleeding); organ or nerve damage (i.e.: Any type of peripheral nerve, nerve root, or spinal cord injury) with subsequent damage to sensory, motor, and/or autonomic systems, resulting in permanent pain, numbness, and/or weakness of one or several areas of the body; allergic reactions; (i.e.: anaphylactic reaction); and/or death. Furthermore, the patient was informed of those risks and complications associated with the medications. These include, but are not limited to: allergic reactions (i.e.: anaphylactic or anaphylactoid reaction(s)); adrenal axis suppression; blood sugar elevation that in diabetics may result in ketoacidosis or comma; water retention that in patients with history of congestive heart failure may result in shortness of breath, pulmonary edema, and decompensation with resultant heart failure; weight gain; swelling or edema; medication-induced neural toxicity; particulate matter embolism and blood vessel occlusion with resultant organ, and/or  nervous system infarction; and/or aseptic necrosis of one or more joints. Finally, the patient was informed that Medicine is not an exact science; therefore, there is also the possibility of unforeseen or unpredictable risks and/or possible complications that may result in a catastrophic outcome. The patient indicated having understood very clearly. We have given the patient no guarantees and we have made no promises. Enough time was given to the patient to ask questions, all of which were answered to the patient's satisfaction. Ms. Mcnamer has indicated that she wanted to continue with the procedure. Attestation: I, the ordering provider, attest that I have discussed with the patient the benefits, risks, side-effects, alternatives, likelihood of achieving goals, and potential problems during recovery for the procedure that I have provided informed consent. Date  Time: 02/09/2023  9:42 AM   Pre-Procedure Preparation:  Monitoring: As per clinic protocol. Respiration, ETCO2, SpO2, BP, heart rate and rhythm monitor placed and checked for adequate function Safety Precautions: Patient was assessed for positional comfort and pressure points before starting the procedure. Time-out: I initiated and conducted the "Time-out" before starting the procedure, as per protocol. The patient was asked to participate by confirming the accuracy of the "Time Out" information. Verification of the correct person, site, and procedure were performed and confirmed by me, the nursing staff, and the patient. "Time-out" conducted as per Joint Commission's Universal Protocol (UP.01.01.01). Time: 1022 Start Time: 1022 hrs.  Description of Procedure:          Laterality: (see above) Targeted Levels: (see above)  Safety Precautions: Aspiration looking for blood return was conducted prior to all injections. At no point did we inject any substances, as a needle was being advanced. Before injecting, the patient was told to  immediately notify me if she was experiencing any new onset of "ringing in the ears, or metallic taste in the mouth". No attempts were made at seeking any paresthesias. Safe injection practices and needle disposal techniques used. Medications  properly checked for expiration dates. SDV (single dose vial) medications used. After the completion of the procedure, all disposable equipment used was discarded in the proper designated medical waste containers. Local Anesthesia: Protocol guidelines were followed. The patient was positioned over the fluoroscopy table. The area was prepped in the usual manner. The time-out was completed. The target area was identified using fluoroscopy. A 12-in long, straight, sterile hemostat was used with fluoroscopic guidance to locate the targets for each level blocked. Once located, the skin was marked with an approved surgical skin marker. Once all sites were marked, the skin (epidermis, dermis, and hypodermis), as well as deeper tissues (fat, connective tissue and muscle) were infiltrated with a small amount of a short-acting local anesthetic, loaded on a 10cc syringe with a 25G, 1.5-in  Needle. An appropriate amount of time was allowed for local anesthetics to take effect before proceeding to the next step. Local Anesthetic: Lidocaine 2.0% The unused portion of the local anesthetic was discarded in the proper designated containers. Technical description of process:  L3 Medial Branch Nerve Block (MBB): The target area for the L3 medial branch is at the junction of the postero-lateral aspect of the superior articular process and the superior, posterior, and medial edge of the transverse process of L4. Under fluoroscopic guidance, a Quincke needle was inserted until contact was made with os over the superior postero-lateral aspect of the pedicular shadow (target area). After negative aspiration for blood, 2mL of the nerve block solution was injected without difficulty or complication.  The needle was removed intact. L4 Medial Branch Nerve Block (MBB): The target area for the L4 medial branch is at the junction of the postero-lateral aspect of the superior articular process and the superior, posterior, and medial edge of the transverse process of L5. Under fluoroscopic guidance, a Quincke needle was inserted until contact was made with os over the superior postero-lateral aspect of the pedicular shadow (target area). After negative aspiration for blood, 2 mL of the nerve block solution was injected without difficulty or complication. The needle was removed intact. L5 Medial Branch Nerve Block (MBB): The target area for the L5 medial branch is at the junction of the postero-lateral aspect of the superior articular process and the superior, posterior, and medial edge of the sacral ala. Under fluoroscopic guidance, a Quincke needle was inserted until contact was made with os over the superior postero-lateral aspect of the pedicular shadow (target area). After negative aspiration for blood, 2 mL of the nerve block solution was injected without difficulty or complication. The needle was removed intact.   Once the entire procedure was completed, the treated area was cleaned, making sure to leave some of the prepping solution back to take advantage of its long term bactericidal properties.         Illustration of the posterior view of the lumbar spine and the posterior neural structures. Laminae of L2 through S1 are labeled. DPRL5, dorsal primary ramus of L5; DPRS1, dorsal primary ramus of S1; DPR3, dorsal primary ramus of L3; FJ, facet (zygapophyseal) joint L3-L4; I, inferior articular process of L4; LB1, lateral branch of dorsal primary ramus of L1; IAB, inferior articular branches from L3 medial branch (supplies L4-L5 facet joint); IBP, intermediate branch plexus; MB3, medial branch of dorsal primary ramus of L3; NR3, third lumbar nerve root; S, superior articular process of L5; SAB,  superior articular branches from L4 (supplies L4-5 facet joint also); TP3, transverse process of L3.   Facet Joint Innervation (* possible  contribution)  L1-2 T12, L1 (L2*)  Medial Branch  L2-3 L1, L2 (L3*)         "          "  L3-4 L2, L3 (L4*)         "          "  L4-5 L3, L4 (L5*)         "          "  L5-S1 L4, L5, S1          "          "    Vitals:   02/09/23 1018 02/09/23 1023 02/09/23 1024 02/09/23 1029  BP: (!) 179/88 (!) 157/78 (!) 143/80 (!) 154/83  Pulse:      Resp: 17 11 10 16   Temp:      TempSrc:      SpO2: 97% 97% 96% 96%  Weight:      Height:         End Time: 1029 hrs.  Imaging Guidance (Spinal):          Type of Imaging Technique: Fluoroscopy Guidance (Spinal) Indication(s): Fluoroscopy guidance for needle placement to enhance accuracy in procedures requiring precise needle localization for targeted delivery of medication in or near specific anatomical locations not easily accessible without such real-time imaging assistance. Exposure Time: Please see nurses notes. Contrast: None used. Fluoroscopic Guidance: I was personally present during the use of fluoroscopy. "Tunnel Vision Technique" used to obtain the best possible view of the target area. Parallax error corrected before commencing the procedure. "Direction-depth-direction" technique used to introduce the needle under continuous pulsed fluoroscopy. Once target was reached, antero-posterior, oblique, and lateral fluoroscopic projection used confirm needle placement in all planes. Images permanently stored in EMR. Interpretation: No contrast injected. I personally interpreted the imaging intraoperatively. Adequate needle placement confirmed in multiple planes. Permanent images saved into the patient's record.  Post-operative Assessment:  Post-procedure Vital Signs:  Pulse/HCG Rate: 7072 Temp: (!) 97.4 F (36.3 C) Resp: 16 BP: (!) 154/83 SpO2: 96 %  EBL: None  Complications: No immediate  post-treatment complications observed by team, or reported by patient.  Note: The patient tolerated the entire procedure well. A repeat set of vitals were taken after the procedure and the patient was kept under observation following institutional policy, for this type of procedure. Post-procedural neurological assessment was performed, showing return to baseline, prior to discharge. The patient was provided with post-procedure discharge instructions, including a section on how to identify potential problems. Should any problems arise concerning this procedure, the patient was given instructions to immediately contact us, at any time, without hesitation. In any case, we plan to contact the patient by telephone for a follow-up status report regarding this interventional procedure.  Comments:  No additional relevant information.  Plan of Care (POC)  Orders:  Orders Placed This Encounter  Procedures   DG PAIN CLINIC C-ARM 1-60 MIN NO REPORT    Intraoperative interpretation by procedural physician at [facility].    Standing Status:   Standing    Number of Occurrences:   1    Order Specific Question:   Reason for exam:    Answer:   Assistance in needle guidance and placement for procedures requiring needle placement in or near specific anatomical locations not easily accessible without such assistance.     Medications ordered for procedure: Meds ordered this encounter  Medications   lidocaine (XYLOCAINE) 2 % (with pres)  injection 400 mg   ropivacaine (PF) 2 mg/mL (0.2%) (NAROPIN) injection 18 mL   dexamethasone (DECADRON) injection 20 mg   Medications administered: We administered lidocaine, ropivacaine (PF) 2 mg/mL (0.2%), and dexamethasone.  See the medical record for exact dosing, route, and time of administration.  Follow-up plan:   Return in about 2 weeks (around 02/23/2023), or PPE VV.       B/L [facility] injection and Cervical TPI; B/L L3,4,5 MBNB 02/09/27     Recent  Visits Date Type Provider Dept  12/27/22 Procedure visit Edward Jolly, MD [facility]  12/09/22 Office Visit Edward Jolly, MD [facility]  Showing recent visits within past 90 days and meeting all other requirements Today's Visits Date Type Provider Dept  02/09/23 Procedure visit Edward Jolly, MD [facility]  Showing today's visits and meeting all other requirements Future Appointments Date Type Provider Dept  03/02/23 Appointment Edward Jolly, MD [facility]  Showing future appointments within next 90 days and meeting all other requirements  Disposition: Discharge home  Discharge (Date  Time): 02/09/2023; 1035 hrs.   Primary Care Physician: Ronnald Ramp, MD Location: [facility] Note by: Edward Jolly, MD (TTS technology used. I apologize for any typographical errors that were not detected and corrected.) Date: 02/09/2023; Time: 11:08 AM  Disclaimer:  Medicine is not an Visual merchandiser. The only guarantee in medicine is that nothing is guaranteed. It is important to note that the decision to proceed with this intervention was based on the information collected from the patient. The Data and conclusions were drawn from the patient's questionnaire, the interview, and the physical examination. Because the information was provided in large part by the patient, it cannot be guaranteed that it has not been purposely or unconsciously manipulated. Every effort has been made to obtain as much relevant data as possible for this evaluation. It is important to note that the conclusions that lead to this procedure are derived in large part from the available data. Always take into account that the treatment will also be dependent on availability of resources and existing treatment guidelines, considered by other Pain Management Practitioners as being common knowledge and practice, at the time of the intervention. For  Medico-Legal purposes, it is also important to point out that variation in procedural techniques and pharmacological choices are the acceptable norm. The indications, contraindications, technique, and results of the above procedure should only be interpreted and judged by a Board-Certified Interventional Pain Specialist with extensive familiarity and expertise in the same exact procedure and technique.

## 2023-02-11 ENCOUNTER — Telehealth: Payer: Self-pay

## 2023-02-11 NOTE — Telephone Encounter (Signed)
Post procedure follow up.  Patient states she is doing great.

## 2023-02-12 ENCOUNTER — Encounter: Payer: Self-pay | Admitting: Podiatry

## 2023-02-12 NOTE — Progress Notes (Signed)
  Subjective:  Patient ID: Joyce Brown, female    DOB: 1950/03/26,  MRN: [id number]  Eduardo Elmquist presents to clinic today for at risk foot care with history of diabetic neuropathy and painful thick toenails that are difficult to trim. Pain interferes with ambulation. Aggravating factors include wearing enclosed shoe gear. Pain is relieved with periodic professional debridement.  Chief Complaint  Patient presents with   Nail Problem    Pt is here for [facility], last A1C was 8.7, PCP is Dr Sharol Harness and LOV was 01/11/23   New problem(s): None.   PCP is Simmons-Robinson, Makiera, MD.  No Known Allergies  Review of Systems: Negative except as noted in the HPI.  Objective: No changes noted in today's physical examination. There were no vitals filed for this visit. Joyce Brown is a pleasant 72 y.o. female WD, WN in NAD. AAO x 3.  Vascular Examination: Capillary refill time immediate b/l. Vascular status intact b/l with palpable pedal pulses. Pedal hair present b/l. No pain with calf compression b/l. Skin temperature gradient WNL b/l. No cyanosis or clubbing b/l. No ischemia or gangrene noted b/l. No varicosities noted.  Neurological Examination: Pt has subjective symptoms of neuropathy. Sensation grossly intact b/l with 10 gram monofilament. Vibratory sensation intact b/l.   Dermatological Examination: Pedal skin with normal turgor, texture and tone b/l.  No open wounds. No interdigital macerations.   Toenails 1-5 b/l thick, discolored, elongated with subungual debris and pain on dorsal palpation.   No hyperkeratotic nor porokeratotic lesions present on today's visit.  Musculoskeletal Examination: Hammertoe deformity noted 2-5 b/l. Utilizes wheelchair for mobility assistance.  Radiographs: None  Last A1c:      Latest Ref Rng & Units 09/09/2022   12:17 PM 06/17/2022    8:08 AM  Hemoglobin A1C  Hemoglobin-A1c 4.0 - 5.6 % 8.2  7.1    Assessment/Plan: 1. Pain due to  onychomycosis of toenails of both feet   2. Diabetic polyneuropathy associated with type 1 diabetes mellitus (HCC)     -Consent given for treatment as described below: -Examined patient. -Continue supportive shoe gear daily. -Toenails 1-5 b/l were debrided in length and girth with sterile nail nippers and dremel without iatrogenic bleeding.  -Patient/POA to call should there be question/concern in the interim.   Return in about 9 weeks (around 04/08/2023).  Freddie Breech, DPM      [facility] LOCATION: [address], [place] [address]                   Office [phone]   [facility] LOCATION: [address] [place], [place] [address] Office [phone]

## 2023-02-17 ENCOUNTER — Encounter: Payer: Medicare Other | Attending: Internal Medicine | Admitting: Dietician

## 2023-02-17 ENCOUNTER — Encounter: Payer: Self-pay | Admitting: Dietician

## 2023-02-17 VITALS — Ht 70.0 in | Wt 158.4 lb

## 2023-02-17 DIAGNOSIS — E1042 Type 1 diabetes mellitus with diabetic polyneuropathy: Secondary | ICD-10-CM | POA: Insufficient documentation

## 2023-02-17 DIAGNOSIS — E1142 Type 2 diabetes mellitus with diabetic polyneuropathy: Secondary | ICD-10-CM | POA: Diagnosis not present

## 2023-02-17 NOTE — Patient Instructions (Addendum)
Great job getting accurate carb counts! Make sure to include enough protein with meals, 10-30grams. Add 1-2 snacks that include protein such as [place] yogurt, cottage cheese, precooked diced chicken, low sodium canned chicken or tuna, hummus, other vegetarian options like tofu or tempeh. Daily goal for protein is about 60 grams to help maintain muscle strength.

## 2023-02-17 NOTE — Progress Notes (Signed)
Diabetes Self-Management Education  Visit Type:  Follow-up  Appt. Start Time: 900 Appt. End Time: 1000  02/17/2023  Joyce Brown, identified by name and date of birth, is a 72 y.o. female with a diagnosis of Diabetes: Type 1 .   ASSESSMENT  Height 5\' 10"  (1.778 m), weight 158 lb 6.4 oz (71.8 kg). Body mass index is 22.73 kg/m.    Diabetes Self-Management Education - 02/17/23 0915       Complications   How often do you check your blood sugar? > 4 times/day    Fasting Blood glucose range (mg/dL) 16-109;604-540    Postprandial Blood glucose range (mg/dL) 98-119;147-829;>562    Have you had a dilated eye exam in the past 12 months? Yes      Dietary Intake   Breakfast cereal or yogurt with fruit    Snack (morning) none    Lunch soup minestrone or lentil    Snack (afternoon) none    Dinner soup, frozen meal;    Snack (evening) none    Beverage(s) water,      Patient Education   Healthy Eating Food label reading, portion sizes and measuring food.;Meal options for control of blood glucose level and chronic complications.;Other (comment);Carbohydrate counting   goal for daily protein intake and suitable options   Being Active Identified with patient nutritional and/or medication changes necessary with exercise.    Acute complications Taught prevention, symptoms, and  treatment of hypoglycemia - the 15 rule.      Post-Education Assessment   Patient understands the diabetes disease and treatment process. Demonstrates understanding / competency    Patient understands incorporating nutritional management into lifestyle. Comprehends key points    Patient undertands incorporating physical activity into lifestyle. Demonstrates understanding / competency    Patient understands using medications safely. Demonstrates understanding / competency    Patient understands monitoring blood glucose, interpreting and using results Demonstrates understanding / competency    Patient understands  prevention, detection, and treatment of acute complications. Demonstrates understanding / competency    Patient understands prevention, detection, and treatment of chronic complications. Comprehends key points    Patient understands how to develop strategies to address psychosocial issues. Comprehends key points    Patient understands how to develop strategies to promote health/change behavior. Demonstrates understanding / competency      Outcomes   Program Status Completed      Subsequent Visit   Since your last visit have you continued or begun to take your medications as prescribed? Yes    Since your last visit, are you checking your blood glucose at least once a day? Yes             Learning Objective:  Patient will have a greater understanding of diabetes self-management. Patient education plan is to attend individual and/or group sessions per assessed needs and concerns.   Plan:   Patient Instructions  [employer] job getting accurate carb counts! Make sure to include enough protein with meals, 10-30grams. Add 1-2 snacks that include protein such as [place] yogurt, cottage cheese, precooked diced chicken, low sodium canned chicken or tuna, hummus, other vegetarian options like tofu or tempeh. Daily goal for protein is about 60 grams to help maintain muscle strength.    Expected Outcomes:  Demonstrated interest in learning. Expect positive outcomes  Education material provided: Vegetarian Proteins  If problems or questions, patient to contact team via:  Phone and patient portal  Future DSME appointment: - PRN; patient will plan to make contact in  1-2 months to report progress and schedule another visit if needed

## 2023-02-24 ENCOUNTER — Other Ambulatory Visit: Payer: Self-pay | Admitting: Family Medicine

## 2023-02-25 DIAGNOSIS — R2689 Other abnormalities of gait and mobility: Secondary | ICD-10-CM | POA: Diagnosis not present

## 2023-02-25 DIAGNOSIS — M6289 Other specified disorders of muscle: Secondary | ICD-10-CM | POA: Diagnosis not present

## 2023-02-25 DIAGNOSIS — M62838 Other muscle spasm: Secondary | ICD-10-CM | POA: Diagnosis not present

## 2023-02-25 DIAGNOSIS — M79642 Pain in left hand: Secondary | ICD-10-CM | POA: Diagnosis not present

## 2023-02-25 DIAGNOSIS — R2681 Unsteadiness on feet: Secondary | ICD-10-CM | POA: Diagnosis not present

## 2023-02-25 DIAGNOSIS — M79641 Pain in right hand: Secondary | ICD-10-CM | POA: Diagnosis not present

## 2023-02-25 DIAGNOSIS — M542 Cervicalgia: Secondary | ICD-10-CM | POA: Diagnosis not present

## 2023-02-25 DIAGNOSIS — G119 Hereditary ataxia, unspecified: Secondary | ICD-10-CM | POA: Diagnosis not present

## 2023-03-01 ENCOUNTER — Telehealth: Payer: Self-pay

## 2023-03-01 NOTE — Progress Notes (Unsigned)
Spoke with patient and completed nursing pre-appointment questions for telephone visit with Dr. Cherylann Ratel tomorrow.

## 2023-03-01 NOTE — Telephone Encounter (Signed)
Spoke with patient and completed nursing pre-appointment questions for telephone visit with Dr. Cherylann Ratel tomorrow.

## 2023-03-02 ENCOUNTER — Ambulatory Visit
Payer: Medicare Other | Attending: Student in an Organized Health Care Education/Training Program | Admitting: Student in an Organized Health Care Education/Training Program

## 2023-03-02 ENCOUNTER — Encounter: Payer: Self-pay | Admitting: Student in an Organized Health Care Education/Training Program

## 2023-03-02 DIAGNOSIS — M47816 Spondylosis without myelopathy or radiculopathy, lumbar region: Secondary | ICD-10-CM

## 2023-03-02 DIAGNOSIS — M5412 Radiculopathy, cervical region: Secondary | ICD-10-CM | POA: Diagnosis not present

## 2023-03-02 DIAGNOSIS — M180 Bilateral primary osteoarthritis of first carpometacarpal joints: Secondary | ICD-10-CM

## 2023-03-02 NOTE — Progress Notes (Signed)
Patient: Joyce Brown  Service Category: E/M  Provider: Edward Jolly, MD  DOB: 1950/09/15  DOS: 03/02/2023  Location: Office  MRN: [id number]  Setting: Ambulatory outpatient  Referring Provider: Brett Brown*  Type: Established Patient  Specialty: Interventional Pain Management  PCP: Joyce Ramp, MD  Location: Remote location  Delivery: TeleHealth     Virtual Encounter - Pain Management PROVIDER NOTE: Information contained herein reflects review and annotations entered in association with encounter. Interpretation of such information and data should be left to medically-trained personnel. Information provided to patient can be located elsewhere in the medical record under "Patient Instructions". Document created using STT-dictation technology, any transcriptional errors that may result from process are unintentional.    Contact & Pharmacy Preferred: [phone] Home: [phone] (home) Mobile: [phone] (mobile) E-mail: [email or web address]  [facility] - [place], [place] - [address] [place] [address] Phone: [phone] Fax: [phone]   Pre-screening  [name] offered "in-person" vs "virtual" encounter. She indicated preferring virtual for this encounter.   Reason COVID-19*  Social distancing based on CDC and AMA recommendations.   I contacted Joyce Brown on 03/02/2023 via telephone.      I clearly identified myself as Edward Jolly, MD. I verified that I was speaking with the correct person using two identifiers (Name: Joyce Brown, and date of birth: 1950/10/21).  Consent I sought verbal advanced consent from Joyce Brown for virtual visit interactions. I informed Joyce Brown of possible security and privacy concerns, risks, and limitations associated with providing "not-in-person" medical evaluation and management services. I also informed Joyce Brown of the  availability of "in-person" appointments. Finally, I informed her that there would be a charge for the virtual visit and that she could be  personally, fully or partially, financially responsible for it. Joyce Brown expressed understanding and agreed to proceed.   Historic Elements   Joyce Brown is a 72 y.o. year old, female patient evaluated today after our last contact on 02/09/2023. Joyce Brown  has a past medical history of Arthritis (1995), Asthma (1995), Cataract (2017), Cerebellar ataxia (HCC), Depression (1996), Diabetes mellitus without complication (HCC), Diabetic neuropathy (HCC) (12/09/2020), FH: ovarian cancer in first degree relative (11/16/2018), Foot fracture (2022), Gait abnormality (11/07/2018), GERD (gastroesophageal reflux disease) (1985), Glaucoma (Various), Hyperlipidemia, Hypertension (1985), Neuromuscular disorder (HCC) (2006), Pulmonary embolism (HCC), Sleep apnea, and Spinal stenosis. She also  has a past surgical history that includes Gastric bypass (2014); Ankle surgery (Left); Bilateral carpal tunnel release; Adenoidectomy; Elbow surgery; Hand neuroplasty; Tonsillectomy; Abdominal hysterectomy; Breast biopsy (Left, 2019); radio denervation (05/21/2020); Small intestine surgery (08/18/2013); Eye surgery (03/14/22 & 03/17/22); Colon surgery (09/25/2012); and Abdominal hysterectomy (1997). Joyce Brown has a current medication list which includes the following prescription(s): acetaminophen, antiseptic oral rinse, atorvastatin, bd insulin syringe u/f 1/2unit, carboxymethylcellulose, dexcom g7 sensor, comirnaty, docusate sodium, estradiol, gabapentin, glucose blood, novolog penfill, omnipod 5 dexg7g6 pods gen 5, droplet pen needles, microlet lancets, miebo, mirabegron, multi-vitamin, omega 3, arexvy, sertraline, sodium fluoride, tizanidine, and twinrix. She  reports that she has never smoked. She has never been exposed to tobacco smoke. She has never used smokeless tobacco.  She reports that she does not drink alcohol and does not use drugs. Joyce Brown has no known allergies.  BMI: Estimated body mass index is 22.73 kg/m as calculated from the following:   Height as of 02/17/23: 5\' 10"  (1.778 m).   Weight as of 02/17/23: 158 lb 6.4 oz (71.8 kg).  Last encounter: 12/09/2022. Last procedure: 02/09/2023.  HPI  Today, she is being contacted for a post-procedure assessment.   Post-procedure evaluation    Type: Lumbar Facet, Medial Branch Block(s) (w/ fluoroscopic mapping) #1  Laterality: Bilateral  Level: L3, L4, and L5 Medial Branch Level(s). Injecting these levels blocks the L3-4 and L4-5 lumbar facet joints. Imaging: Fluoroscopic guidance         Anesthesia: Local anesthesia (1-2% Lidocaine) Sedation: No Sedation                       DOS: 02/09/2023 Performed by: Edward Jolly, MD  Primary Purpose: Diagnostic/Therapeutic Indications: Low back pain severe enough to impact quality of life or function. 1. Lumbar spondylosis   2. Lumbar facet arthropathy    NAS-11 Pain score:   Pre-procedure: 5 /10   Post-procedure: 8  (when standing up to an 8)/10     Effectiveness:  Initial hour after procedure: 50 %  Subsequent 4-6 hours post-procedure: 50 %  Analgesia past initial 6 hours: 80 %  Ongoing improvement:  Analgesic:  80% Function: Joyce Brown reports improvement in function ROM: Joyce Brown reports improvement in ROM   Laboratory Chemistry Profile   Renal Lab Results  Component Value Date   BUN 27 06/09/2022   CREATININE 0.85 06/09/2022   BCR 32 (H) 06/09/2022   GFR 68.27 06/26/2021   GFRAA 80 11/20/2019   GFRNONAA >60 10/05/2020    Hepatic Lab Results  Component Value Date   AST 26 06/09/2022   ALT 20 06/09/2022   ALBUMIN 4.2 06/09/2022   ALKPHOS 76 06/09/2022   LIPASE 26 02/06/2019    Electrolytes Lab Results  Component Value Date   NA 141 06/09/2022   K 4.4 06/09/2022   CL 105 06/09/2022   CALCIUM 9.5 06/09/2022     Bone No results found for: "VD25OH", "VD125OH2TOT", "ZO1096EA5", "WU9811BJ4", "25OHVITD1", "25OHVITD2", "25OHVITD3", "TESTOFREE", "TESTOSTERONE"  Inflammation (CRP: Acute Phase) (ESR: Chronic Phase) No results found for: "CRP", "ESRSEDRATE", "LATICACIDVEN"       Note: Above Lab results reviewed.  Narrative & Impression  [facility]   NEUROIMAGING REPORT     STUDY DATE: 06/09/21 PATIENT NAME: Elnor Perrelli DOB: 1950-10-19 MRN: [id number]   ORDERING CLINICIAN: Levert Feinstein, MD  CLINICAL HISTORY: 72 year old female with ataxia.   EXAM: MR CERVICAL SPINE WO CONTRAST  TECHNIQUE: MRI of the cervical spine was obtained utilizing multiplanar, multiecho pulse sequences. CONTRAST: none  COMPARISON: none    IMAGING SITE: [facility] AT [address] [place]       FINDINGS:    On sagittal views the vertebral bodies have normal height and alignment.  Degenerative spondylosis and disc bulging from C3-4 down to C6-7.  Degenerative marrow edema at C6 and C7 vertebral bodies anteriorly with degenerative endplate disease.  The spinal cord is normal in size and appearance. The posterior fossa, pituitary gland and paraspinal soft tissues are unremarkable.     On axial views there is no spinal stenosis or foraminal narrowing except: - C6-7 disc bulging and facet hypertrophy with mild bilateral foraminal stenosis.   Limited views of the soft tissues of the head and neck are unremarkable.    Assessment  The primary encounter diagnosis was Lumbar spondylosis. Diagnoses of Lumbar facet arthropathy and Cervical radicular pain were also pertinent to this visit.  Plan of Care  1. Lumbar spondylosis (Primary) -Excellent response to first diagnostic lumbar facet medial branch nerve block,  repeat #2 and then consider RFA - LUMBAR FACET(MEDIAL BRANCH NERVE BLOCK) MBNB; Future  2. Lumbar facet arthropathy --Excellent response to first diagnostic  lumbar facet medial branch nerve block, repeat #2 and then consider RFA - LUMBAR FACET(MEDIAL BRANCH NERVE BLOCK) MBNB; Future  3. Cervical radicular pain -Patient having increased cervical spine pain.  She has disc bulge at multiple levels from C3-C4 down to C6-C7 as noted in her MRI report above.  She has C6-C7 disc bulge with mild to moderate bilateral foraminal stenosis.  Recommend diagnostic cervical epidural steroid injection.  Risk benefits reviewed and patient would like to proceed. - Cervical Epidural Injection; Future     Orders:  Orders Placed This Encounter  Procedures   LUMBAR FACET(MEDIAL BRANCH NERVE BLOCK) MBNB    Standing Status:   Future    Expiration Date:   05/31/2023    Scheduling Instructions:     Procedure: Lumbar facet block (AKA.: Lumbosacral medial branch nerve block)     Side: Bilateral     Level: L3-4, L4-5,Facets (L3, L4, L5, Medial Branch)     Sedation: Patient's choice.     Timeframe: ASAA    Where will this procedure be performed?:   [facility]   Cervical Epidural Injection    Sedation: Patient's choice. Purpose: Diagnostic/Therapeutic Indication(s): Radiculitis and cervicalgia associater with cervical degenerative disc disease.    Standing Status:   Future    Expiration Date:   05/31/2023    Scheduling Instructions:     Procedure: Cervical Epidural Steroid Injection/Block     Level(s): C7-T1     Laterality: TBD     Timeframe: As soon as schedule allows    Where will this procedure be performed?:   [facility]             Justyn Boyson   Follow-up plan:   Return in about 4 weeks (around 03/30/2023) for C-ESI + B/L L3, 4, 5 MBNB #2, in clinic NS.      B/L [facility] injection and Cervical TPI; B/L L3,4,5 MBNB 02/09/27      Recent Visits Date Type Provider Dept  02/09/23 Procedure visit Edward Jolly, MD [facility]  12/27/22 Procedure visit Edward Jolly, MD [facility]  12/09/22 Office Visit Edward Jolly, MD  [facility]  Showing recent visits within past 90 days and meeting all other requirements Today's Visits Date Type Provider Dept  03/02/23 Office Visit Edward Jolly, MD [facility]  Showing today's visits and meeting all other requirements Future Appointments No visits were found meeting these conditions. Showing future appointments within next 90 days and meeting all other requirements  I discussed the assessment and treatment plan with the patient. The patient was provided an opportunity to ask questions and all were answered. The patient agreed with the plan and demonstrated an understanding of the instructions.  Patient advised to call back or seek an in-person evaluation if the symptoms or condition worsens.  Duration of encounter: 15 minutes.  Note by: Edward Jolly, MD Date: 03/02/2023; Time: 3:36 PM

## 2023-03-17 ENCOUNTER — Encounter: Payer: Self-pay | Admitting: Gastroenterology

## 2023-03-24 ENCOUNTER — Ambulatory Visit: Payer: Medicare Other | Admitting: Anesthesiology

## 2023-03-24 ENCOUNTER — Encounter: Payer: Self-pay | Admitting: Gastroenterology

## 2023-03-24 ENCOUNTER — Other Ambulatory Visit: Payer: Self-pay

## 2023-03-24 ENCOUNTER — Ambulatory Visit
Admission: RE | Admit: 2023-03-24 | Discharge: 2023-03-24 | Disposition: A | Discharge status: Home / Self Care | Payer: Medicare Other | Attending: Gastroenterology | Admitting: Gastroenterology

## 2023-03-24 ENCOUNTER — Encounter
Admission: RE | Disposition: A | Discharge status: Home / Self Care | Payer: Self-pay | Source: Home / Self Care | Attending: Gastroenterology

## 2023-03-24 DIAGNOSIS — G709 Myoneural disorder, unspecified: Secondary | ICD-10-CM | POA: Diagnosis not present

## 2023-03-24 DIAGNOSIS — I10 Essential (primary) hypertension: Secondary | ICD-10-CM | POA: Insufficient documentation

## 2023-03-24 DIAGNOSIS — K635 Polyp of colon: Secondary | ICD-10-CM | POA: Diagnosis not present

## 2023-03-24 DIAGNOSIS — Z1211 Encounter for screening for malignant neoplasm of colon: Secondary | ICD-10-CM | POA: Diagnosis not present

## 2023-03-24 DIAGNOSIS — G4730 Sleep apnea, unspecified: Secondary | ICD-10-CM | POA: Diagnosis not present

## 2023-03-24 DIAGNOSIS — J45909 Unspecified asthma, uncomplicated: Secondary | ICD-10-CM | POA: Insufficient documentation

## 2023-03-24 DIAGNOSIS — K6389 Other specified diseases of intestine: Secondary | ICD-10-CM | POA: Diagnosis not present

## 2023-03-24 HISTORY — PX: COLONOSCOPY WITH PROPOFOL: SHX5780

## 2023-03-24 HISTORY — PX: POLYPECTOMY: SHX5525

## 2023-03-24 LAB — GLUCOSE, CAPILLARY: Glucose-Capillary: 121 mg/dL — ABNORMAL HIGH (ref 70–99)

## 2023-03-24 SURGERY — COLONOSCOPY WITH PROPOFOL
Anesthesia: General

## 2023-03-24 MED ORDER — PROPOFOL 1000 MG/100ML IV EMUL
INTRAVENOUS | Status: AC
  Filled 2023-03-24: qty 100

## 2023-03-24 MED ORDER — SODIUM CHLORIDE 0.9 % IV SOLN: INTRAVENOUS | Status: DC

## 2023-03-24 MED ORDER — LIDOCAINE HCL (PF) 2 % IJ SOLN
INTRAMUSCULAR | Status: AC
Start: 2023-03-24 — End: ?
  Filled 2023-03-24: qty 5

## 2023-03-24 MED ORDER — PROPOFOL 500 MG/50ML IV EMUL
INTRAVENOUS | Status: DC | PRN
  Administered 2023-03-24: 50 ug/kg/min via INTRAVENOUS

## 2023-03-24 MED ORDER — LIDOCAINE HCL (CARDIAC) PF 100 MG/5ML IV SOSY
PREFILLED_SYRINGE | INTRAVENOUS | Status: DC | PRN
  Administered 2023-03-24: 60 mg via INTRAVENOUS

## 2023-03-24 MED ORDER — PROPOFOL 10 MG/ML IV BOLUS
INTRAVENOUS | Status: DC | PRN
  Administered 2023-03-24: 4 mg via INTRAVENOUS

## 2023-03-24 MED ORDER — EPHEDRINE SULFATE-NACL 50-0.9 MG/10ML-% IV SOSY
PREFILLED_SYRINGE | INTRAVENOUS | Status: DC | PRN
  Administered 2023-03-24: 10 mg via INTRAVENOUS
  Administered 2023-03-24: 15 mg via INTRAVENOUS

## 2023-03-24 NOTE — Transfer of Care (Signed)
 Immediate Anesthesia Transfer of Care Note  Patient: Joyce Brown  Procedure(s) Performed: COLONOSCOPY WITH PROPOFOL  POLYPECTOMY  Patient Location: PACU  Anesthesia Type:General  Level of Consciousness: sedated  Airway & Oxygen  Therapy: Patient Spontanous Breathing  Post-op Assessment: Report given to RN and Post -op Vital signs reviewed and stable  Post vital signs: Reviewed and stable  Last Vitals:  Vitals Value Taken Time  BP    Temp    Pulse    Resp    SpO2      Last Pain:  Vitals:   03/24/23 0835  TempSrc: Temporal  PainSc: 0-No pain         Complications: No notable events documented.

## 2023-03-24 NOTE — Anesthesia Postprocedure Evaluation (Signed)
 Anesthesia Post Note  Patient: Joyce Brown  Procedure(s) Performed: COLONOSCOPY WITH PROPOFOL  POLYPECTOMY  Patient location during evaluation: Endoscopy Anesthesia Type: General Level of consciousness: awake and alert Pain management: pain level controlled Vital Signs Assessment: post-procedure vital signs reviewed and stable Respiratory status: spontaneous breathing, nonlabored ventilation, respiratory function stable and patient connected to nasal cannula oxygen  Cardiovascular status: blood pressure returned to baseline and stable Postop Assessment: no apparent nausea or vomiting Anesthetic complications: no   No notable events documented.   Last Vitals:  Vitals:   03/24/23 1000 03/24/23 1010  BP: (!) 133/59   Pulse: 71   Resp: (!) 21   Temp:    SpO2: 96% 99%    Last Pain:  Vitals:   03/24/23 1010  TempSrc:   PainSc: 0-No pain                 Fairy POUR Hunter Bachar

## 2023-03-24 NOTE — Anesthesia Preprocedure Evaluation (Signed)
 Anesthesia Evaluation  Patient identified by MRN, date of birth, ID band Patient awake    Reviewed: Allergy & Precautions, NPO status , Patient's Chart, lab work & pertinent test results  History of Anesthesia Complications Negative for: history of anesthetic complications  Airway Mallampati: III  TM Distance: >3 FB Neck ROM: full    Dental  (+) Chipped   Pulmonary asthma , sleep apnea    Pulmonary exam normal        Cardiovascular hypertension, Normal cardiovascular exam     Neuro/Psych  PSYCHIATRIC DISORDERS       Neuromuscular disease    GI/Hepatic Neg liver ROS,GERD  Controlled,,  Endo/Other  negative endocrine ROSdiabetes    Renal/GU negative Renal ROS  negative genitourinary   Musculoskeletal   Abdominal   Peds  Hematology negative hematology ROS (+)   Anesthesia Other Findings Past Medical History: 1995: Arthritis     Comment:  Have family history of it. 1995: Asthma     Comment:  Has resolved 2017: Cataract     Comment:  Family History of them No date: Cerebellar ataxia (HCC) 1996: Depression     Comment:  Due to Fibromyalgia. No date: Diabetes mellitus without complication (HCC) 12/09/2020: Diabetic neuropathy (HCC) 11/16/2018: FH: ovarian cancer in first degree relative 2022: Foot fracture 11/07/2018: Gait abnormality 1985: GERD (gastroesophageal reflux disease)     Comment:  No longer a problem since Gastric Bypass Surgery               09/25/2012. Various: Glaucoma     Comment:  Family History of it. 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  12/2017 is probably              Cer No date: Pulmonary embolism (HCC) No date: Sleep apnea No date: Spinal stenosis  Past Surgical History: No date: ABDOMINAL  HYSTERECTOMY 1997: ABDOMINAL HYSTERECTOMY     Comment:  Partial Hysterectomy No date: ADENOIDECTOMY No date: ANKLE SURGERY; Left No date: BILATERAL CARPAL TUNNEL RELEASE 2019: BREAST BIOPSY; Left     Comment:  benign 09/25/2012: COLON SURGERY     Comment:  Gastric Bypass Surgery No date: ELBOW SURGERY 03/14/22 & 03/17/22: EYE SURGERY     Comment:  Cataract Surgery 2014: GASTRIC BYPASS No date: HAND NEUROPLASTY No date: NASAL SINUS SURGERY 05/21/2020: radio denervation 08/18/2013: SMALL INTESTINE SURGERY     Comment:  Laparoscopic Procedure to Relieve Small Bowel               Obstruction No date: TONSILLECTOMY  BMI    Body Mass Index: 24.97 kg/m      Reproductive/Obstetrics negative OB ROS                             Anesthesia Physical Anesthesia Plan  ASA: 3  Anesthesia Plan: General   Post-op Pain Management:    Induction: Intravenous  PONV Risk Score and Plan: Propofol  infusion and TIVA  Airway Management Planned: Natural Airway and Nasal Cannula  Additional Equipment:   Intra-op Plan:   Post-operative Plan:   Informed Consent: I have reviewed the patients History and Physical, chart, labs and discussed the procedure including the risks, benefits and  alternatives for the proposed anesthesia with the patient or authorized representative who has indicated his/her understanding and acceptance.     Dental Advisory Given  Plan Discussed with: Anesthesiologist, CRNA and Surgeon  Anesthesia Plan Comments: (Patient consented for risks of anesthesia including but not limited to:  - adverse reactions to medications - risk of airway placement if required - damage to eyes, teeth, lips or other oral mucosa - nerve damage due to positioning  - sore throat or hoarseness - Damage to heart, brain, nerves, lungs, other parts of body or loss of life  Patient voiced understanding and assent.)       Anesthesia Quick Evaluation

## 2023-03-24 NOTE — H&P (Signed)
 Joyce JONELLE Brooklyn, MD [address]  [address]  [place], [place] [address]  Main: [phone]  Fax: [phone] Pager: [phone]  Primary Care Physician:  Sharma Coyer, MD Primary Gastroenterologist:  Dr. Corinn JONELLE Brown  Pre-Procedure History & Physical: HPI:  Joyce Brown is a 73 y.o. female is here for an colonoscopy.   Past Medical History:  Diagnosis Date   Arthritis 1995   Have family history of it.   Asthma 1995   Has resolved   Cataract 2017   Family History of them   Cerebellar ataxia (HCC)    Depression 1996   Due to Fibromyalgia.   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   Glaucoma Various   Family History of it.   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   Pulmonary embolism (HCC)    Sleep apnea    Spinal stenosis     Past Surgical History:  Procedure Laterality Date   ABDOMINAL HYSTERECTOMY     ABDOMINAL HYSTERECTOMY  1997   Partial Hysterectomy   ADENOIDECTOMY     ANKLE SURGERY Left    BILATERAL CARPAL TUNNEL RELEASE     BREAST BIOPSY Left 2019   benign   COLON SURGERY  09/25/2012   Gastric Bypass Surgery   ELBOW SURGERY     EYE SURGERY  03/14/22 & 03/17/22   Cataract Surgery   GASTRIC BYPASS  2014   HAND NEUROPLASTY     NASAL SINUS SURGERY     radio denervation  05/21/2020   SMALL INTESTINE SURGERY  08/18/2013   Laparoscopic Procedure to Relieve Small Bowel Obstruction   TONSILLECTOMY      Prior to Admission medications   Medication Sig Start Date End Date Taking? Authorizing Provider   atorvastatin  (LIPITOR ) 80 MG tablet Take 1 tablet (80 mg total) by mouth daily. 08/17/22  Yes Chambliss, Layman CROME, MD  gabapentin  (NEURONTIN ) 100 MG capsule Take 2 capsules (200 mg total) by mouth 3 (three) times daily. 01/11/23  Yes Simmons-Robinson, Makiera, MD  Mirabegron (MYRBETRIQ PO) Take by mouth.   Yes [provider]  tiZANidine  (ZANAFLEX ) 2 MG tablet TAKE 1 TO 2 TABLETS BY MOUTH AT BEDTIME AS NEEDED FOR MUSCLE SPASMS 08/17/22  Yes Chambliss, Layman CROME, MD  Acetaminophen  500 MG capsule Take 500 mg by mouth every 6 (six) hours as needed for moderate pain.    [provider]  antiseptic oral rinse (BIOTENE) LIQD 15 mLs by Mouth Rinse route in the morning, at noon, in the evening, and at bedtime.    [provider]  BD INSULIN  SYRINGE U/F 1/2UNIT 31G X 5/16 0.3 ML MISC USE 3 TIMES A DAY 11/04/21   Shamleffer, Ibtehal Jaralla, MD  carboxymethylcellulose (REFRESH PLUS) 0.5 % SOLN Place 1 drop into both eyes 3 (three) times daily as needed.    [provider]  Continuous Blood Gluc Sensor (DEXCOM G7 SENSOR) MISC by Does not apply route.    [provider]  COVID-19 mRNA vaccine, Pfizer, (COMIRNATY ) syringe Inject into the muscle. Patient not taking: Reported  on 03/17/2023 12/16/22   Luiz Channel, MD  docusate sodium  (COLACE) 100 MG capsule Take 100 mg by mouth daily as needed for mild constipation.    [provider]  estradiol  (ESTRACE ) 0.1 MG/GM vaginal cream Place 1 Applicatorful vaginally 2 (two) times a week.    [provider]  glucose blood test strip Use    [provider]  insulin  aspart (NOVOLOG  PENFILL) cartridge USE MAX OF DAILY DOSE OF 60 UNITS PER CORRECTION SCALE AS DIRECTED Patient taking differently: USE MAX OF DAILY DOSE OF 60 UNITS PER CORRECTION SCALE AS DIRECTED Taking 8 units before breakfast; 10 units before lunch; 7 units before dinner 09/06/22   Shamleffer, Ibtehal Jaralla, MD  Insulin  Disposable Pump  (OMNIPOD 5 DEXG7G6 PODS GEN 5) MISC SMARTSIG:SUB-Q Every 3 Days    [provider]  Insulin  Pen Needle (DROPLET PEN NEEDLES) 31G X 5 MM MISC USE 4 TIMES DAILY AS DIRECTED BY YOUR DOCTOR 01/26/23   Shamleffer, Ibtehal Jaralla, MD  Microlet Lancets MISC 3 (three) times daily. 08/26/17   [provider]  MIEBO 1.338 GM/ML SOLN Apply 1 drop to eye 4 (four) times daily. 10/29/22   [provider]  Multiple Vitamin (MULTI-VITAMIN) tablet Take 2 tablets by mouth daily.    [provider]  Omega 3 1200 MG CAPS Take by mouth. One daily    [provider]  RSV vaccine recomb adjuvanted (AREXVY ) 120 MCG/0.5ML injection Inject into the muscle. Patient not taking: Reported on 03/17/2023 12/16/22   Luiz Channel, MD  sertraline  (ZOLOFT ) 100 MG tablet TAKE 1 TABLET EVERY DAY 02/24/23   Simmons-Robinson, Makiera, MD  Sodium Fluoride  1.1 % PSTE Use 1 Application Nightly after brushing/flossing. Spit out excess. No rinse/eat/drink for at least 30 minutes after application 12/30/22     TWINRIX 720-20 ELU-MCG/ML injection  10/11/22   [provider]    Allergies as of 01/14/2023   (No Known Allergies)    Family History  Problem Relation Age of Onset   Cervical cancer Mother    Ovarian cancer Mother    Arthritis Mother    Cancer Mother    Dementia Father    Retinal detachment Father    Heart attack Father    Diabetes Father    Stroke Father    Hypertension Father    Alcohol  abuse Father    Arthritis Father    Hearing loss Father    Hyperlipidemia Father    Ovarian cancer Sister    Arthritis Sister    Arthritis Sister    Arthritis Sister    Arthritis Sister    Glaucoma Paternal Grandfather    Arthritis Paternal Grandfather    Depression Paternal Grandfather    Hearing loss Paternal Grandfather    Diabetes Paternal Grandmother    Arthritis Sister    Arthritis Sister    Cancer Sister    Diabetes Maternal Uncle    Vision loss Maternal Uncle      Social History   Socioeconomic History   Marital status: Single    Spouse name: Not on file   Number of children: 0   Years of education: 16   Highest education level: Bachelor's degree (e.g., BA, AB, BS)  Occupational History   Occupation: Retired  Tobacco Use   Smoking status: Never    Passive exposure: Never   Smokeless tobacco: Never  Vaping Use   Vaping status: Never Used  Substance and Sexual Activity   Alcohol  use: Never   Drug use:  Never   Sexual activity: Not Currently  Other Topics Concern   Not on file  Social History Narrative   Patient lives alone in [place].    Patient has 1 cat- Monica- Erla passed.   Patient has 4 sisters- [place] , [place], [place] , and [place].   Patient has been walking around her complex for exercise using wheelchair.    Social Drivers of Corporate Investment Banker Strain: Low Risk  (01/11/2023)   Overall Financial Resource Strain (CARDIA)    Difficulty of Paying Living Expenses: Not hard at all  Recent Concern: Financial Resource Strain - Medium Risk (01/07/2023)   Overall Financial Resource Strain (CARDIA)    Difficulty of Paying Living Expenses: Somewhat hard  Food Insecurity: No Food Insecurity (01/11/2023)   Hunger Vital Sign    Worried About Running Out of Food in the Last Year: Never true    Ran Out of Food in the Last Year: Never true  Recent Concern: Food Insecurity - Food Insecurity Present (01/07/2023)   Hunger Vital Sign    Worried About Running Out of Food in the Last Year: Sometimes true    Ran Out of Food in the Last Year: Never true  Transportation Needs: No Transportation Needs (01/11/2023)   PRAPARE - Administrator, Civil Service (Medical): No    Lack of Transportation (Non-Medical): No  Physical Activity: Insufficiently Active (01/11/2023)   Exercise Vital Sign    Days of Exercise per Week: 2 days    Minutes of Exercise per Session: 30 min  Stress: No Stress Concern Present  (01/11/2023)   [employer] of Occupational Health - Occupational Stress Questionnaire    Feeling of Stress : Not at all  Social Connections: Socially Isolated (01/11/2023)   Social Connection and Isolation Panel [NHANES]    Frequency of Communication with Friends and Family: Three times a week    Frequency of Social Gatherings with Friends and Family: Never    Attends Religious Services: Never    Database Administrator or Organizations: No    Attends Banker Meetings: Never    Marital Status: Never married  Intimate Partner Violence: Not At Risk (01/11/2023)   Humiliation, Afraid, Rape, and Kick questionnaire    Fear of Current or Ex-Partner: No    Emotionally Abused: No    Physically Abused: No    Sexually Abused: No    Review of Systems: See HPI, otherwise negative ROS  Physical Exam: BP 138/63   Pulse 67   Temp 97.7 F (36.5 C) (Temporal)   Resp 16   Ht 5' 10 (1.778 m)   Wt 78.9 kg   SpO2 99%   BMI 24.97 kg/m  General:   Alert,  pleasant and cooperative in NAD Head:  Normocephalic and atraumatic. Neck:  Supple; no masses or thyromegaly. Lungs:  Clear throughout to auscultation.    Heart:  Regular rate and rhythm. Abdomen:  Soft, nontender and nondistended. Normal bowel sounds, without guarding, and without rebound.   Neurologic:  Alert and  oriented x4;  grossly normal neurologically.  Impression/Plan: Joyce Brown is here for an colonoscopy to be performed for colon cancer screening  Risks, benefits, limitations, and alternatives regarding  colonoscopy have been reviewed with the patient.  Questions have been answered.  All parties agreeable.   Joyce Brooklyn, MD  03/24/2023, 8:39 AM

## 2023-03-24 NOTE — Op Note (Signed)
 [facility] Gastroenterology Patient Name: Ameri Cahoon Procedure Date: 03/24/2023 9:11 AM MRN: [id number] Account #: [id number] Date of Birth: 1950-05-25 Admit Type: Outpatient Age: 73 Room: [facility] ENDO ROOM 3 Gender: Female Note Status: Finalized Instrument Name: [id number] Procedure:             Colonoscopy Indications:           Screening for colorectal malignant neoplasm, Last                         colonoscopy 10 years ago Providers:             Corinn Jess Brooklyn MD, MD Referring MD:          Rockie Shed (Referring MD) Medicines:             General Anesthesia Complications:         No immediate complications. Estimated blood loss: None. Procedure:             Pre-Anesthesia Assessment:                        - Prior to the procedure, a History and Physical was                         performed, and patient medications and allergies were                         reviewed. The patient is competent. The risks and                         benefits of the procedure and the sedation options and                         risks were discussed with the patient. All questions                         were answered and informed consent was obtained.                         Patient identification and proposed procedure were                         verified by the physician, the nurse, the                         anesthesiologist, the anesthetist and the technician                         in the pre-procedure area in the procedure room in the                         endoscopy suite. Mental Status Examination: alert and                         oriented. Airway Examination: normal oropharyngeal                         airway and neck mobility. Respiratory Examination:  clear to auscultation. CV Examination: normal.                         Prophylactic Antibiotics: The patient does not require                         prophylactic antibiotics.  Prior Anticoagulants: The                         patient has taken no anticoagulant or antiplatelet                         agents. ASA Grade Assessment: III - A patient with                         severe systemic disease. After reviewing the risks and                         benefits, the patient was deemed in satisfactory                         condition to undergo the procedure. The anesthesia                         plan was to use general anesthesia. Immediately prior                         to administration of medications, the patient was                         re-assessed for adequacy to receive sedatives. The                         heart rate, respiratory rate, oxygen  saturations,                         blood pressure, adequacy of pulmonary ventilation, and                         response to care were monitored throughout the                         procedure. The physical status of the patient was                         re-assessed after the procedure.                        After obtaining informed consent, the colonoscope was                         passed under direct vision. Throughout the procedure,                         the patient's blood pressure, pulse, and oxygen                          saturations were monitored continuously. The  Colonoscope was introduced through the anus and                         advanced to the the cecum, identified by appendiceal                         orifice and ileocecal valve. The colonoscopy was                         performed without difficulty. The patient tolerated                         the procedure well. The quality of the bowel                         preparation was evaluated using the BBPS [facility] Bowel                         Preparation Scale) with scores of: Right Colon = 3,                         Transverse Colon = 3 and Left Colon = 3 (entire mucosa                         seen well with no  residual staining, small fragments                         of stool or opaque liquid). The total BBPS score                         equals 9. The ileocecal valve, appendiceal orifice,                         and rectum were photographed. Findings:      The perianal and digital rectal examinations were normal. Pertinent       negatives include normal sphincter tone and no palpable rectal lesions.      A diminutive polyp was found in the cecum. The polyp was sessile. The       polyp was removed with a jumbo cold forceps. Resection and retrieval       were complete.      The retroflexed view of the distal rectum and anal verge was normal and       showed no anal or rectal abnormalities. Impression:            - One diminutive polyp in the cecum, removed with a                         jumbo cold forceps. Resected and retrieved.                        - The distal rectum and anal verge are normal on                         retroflexion view. Recommendation:        - Discharge patient to home (with escort).                        -  Resume previous diet today.                        - Continue present medications.                        - Await pathology results.                        - Repeat colonoscopy in 7-10 years for surveillance                         based on pathology results. Procedure Code(s):     --- Professional ---                        [phone], Colonoscopy, flexible; with biopsy, single or                         multiple Diagnosis Code(s):     --- Professional ---                        Z12.11, Encounter for screening for malignant neoplasm                         of colon                        D12.0, Benign neoplasm of cecum CPT copyright 2022 American Medical Association. All rights reserved. The codes documented in this report are preliminary and upon coder review may  be revised to meet current compliance requirements. Dr. Angelita Brooklyn Corinn Jess Brooklyn MD,  MD 03/24/2023 9:48:34 AM This report has been signed electronically. Number of Addenda: 0 Note Initiated On: 03/24/2023 9:11 AM Scope Withdrawal Time: 0 hours 13 minutes 52 seconds  Total Procedure Duration: 0 hours 19 minutes 59 seconds  Estimated Blood Loss:  Estimated blood loss: none.      [facility]

## 2023-03-25 ENCOUNTER — Encounter: Payer: Self-pay | Admitting: Gastroenterology

## 2023-03-25 LAB — SURGICAL PATHOLOGY

## 2023-03-29 ENCOUNTER — Encounter: Payer: Self-pay | Admitting: Gastroenterology

## 2023-03-30 DIAGNOSIS — E1065 Type 1 diabetes mellitus with hyperglycemia: Secondary | ICD-10-CM | POA: Diagnosis not present

## 2023-03-30 DIAGNOSIS — Z9641 Presence of insulin pump (external) (internal): Secondary | ICD-10-CM | POA: Diagnosis not present

## 2023-03-30 DIAGNOSIS — Z9884 Bariatric surgery status: Secondary | ICD-10-CM | POA: Diagnosis not present

## 2023-04-01 ENCOUNTER — Telehealth: Payer: Self-pay

## 2023-04-01 ENCOUNTER — Telehealth: Payer: Self-pay | Admitting: Family Medicine

## 2023-04-01 NOTE — Telephone Encounter (Signed)
Copied from CRM [id number]. Topic: General - Other >> Mar 28, 2023 11:40 AM Shon Hale wrote: Reason for CRM: Thurston Hole with [employer] will be faxing over request for last visit notes of patient's last appointment. Needing notes as pt is requesting a decxom g7 sensor  from their company. Confirmed fax number >> Apr 01, 2023 11:35 AM Shon Hale wrote: [employer] calling to check on status of form and requesting last visit date of patient.   Requesting callback, [phone] ext [phone], please use pt's account reference [id number]

## 2023-04-01 NOTE — Telephone Encounter (Signed)
Faxed out paperwork to [employer] regarding medical supplies due to diabetes on 04/01/23  VM

## 2023-04-04 ENCOUNTER — Encounter: Payer: Self-pay | Admitting: Student in an Organized Health Care Education/Training Program

## 2023-04-04 ENCOUNTER — Ambulatory Visit
Admission: RE | Admit: 2023-04-04 | Discharge: 2023-04-04 | Disposition: A | Discharge status: Home / Self Care | Payer: Medicare Other | Source: Ambulatory Visit | Attending: Student in an Organized Health Care Education/Training Program | Admitting: Student in an Organized Health Care Education/Training Program

## 2023-04-04 ENCOUNTER — Ambulatory Visit
Payer: Medicare Other | Attending: Student in an Organized Health Care Education/Training Program | Admitting: Student in an Organized Health Care Education/Training Program

## 2023-04-04 VITALS — BP 130/66 | HR 75 | Temp 97.7°F | Resp 16 | Ht 70.0 in | Wt 174.0 lb

## 2023-04-04 DIAGNOSIS — M47816 Spondylosis without myelopathy or radiculopathy, lumbar region: Secondary | ICD-10-CM | POA: Insufficient documentation

## 2023-04-04 DIAGNOSIS — M5412 Radiculopathy, cervical region: Secondary | ICD-10-CM | POA: Insufficient documentation

## 2023-04-04 MED ORDER — LIDOCAINE HCL 2 % IJ SOLN
INTRAMUSCULAR | Status: AC
  Filled 2023-04-04: qty 20

## 2023-04-04 MED ORDER — ROPIVACAINE HCL 2 MG/ML IJ SOLN
18.0000 mL | Freq: Once | INTRAMUSCULAR | Status: AC
  Administered 2023-04-04: 18 mL via PERINEURAL
  Filled 2023-04-04: qty 20

## 2023-04-04 MED ORDER — DEXAMETHASONE SODIUM PHOSPHATE 10 MG/ML IJ SOLN
20.0000 mg | Freq: Once | INTRAMUSCULAR | Status: AC
  Administered 2023-04-04: 20 mg
  Filled 2023-04-04: qty 2

## 2023-04-04 MED ORDER — IOHEXOL 180 MG/ML  SOLN
INTRAMUSCULAR | Status: AC
  Filled 2023-04-04: qty 20

## 2023-04-04 MED ORDER — DEXAMETHASONE SODIUM PHOSPHATE 10 MG/ML IJ SOLN
10.0000 mg | Freq: Once | INTRAMUSCULAR | Status: AC
  Administered 2023-04-04: 10 mg
  Filled 2023-04-04: qty 1

## 2023-04-04 MED ORDER — ROPIVACAINE HCL 2 MG/ML IJ SOLN
1.0000 mL | Freq: Once | INTRAMUSCULAR | Status: AC
  Administered 2023-04-04: 1 mL via EPIDURAL
  Filled 2023-04-04: qty 20

## 2023-04-04 MED ORDER — SODIUM CHLORIDE 0.9% FLUSH
1.0000 mL | Freq: Once | INTRAVENOUS | Status: AC
  Administered 2023-04-04: 1 mL

## 2023-04-04 MED ORDER — SODIUM CHLORIDE (PF) 0.9 % IJ SOLN
INTRAMUSCULAR | Status: AC
  Filled 2023-04-04: qty 10

## 2023-04-04 NOTE — Progress Notes (Signed)
Safety precautions to be maintained throughout the outpatient stay will include: orient to surroundings, keep bed in low position, maintain call bell within reach at all times, provide assistance with transfer out of bed and ambulation.  

## 2023-04-04 NOTE — Progress Notes (Signed)
PROVIDER NOTE: Interpretation of information contained herein should be left to medically-trained personnel. Specific patient instructions are provided elsewhere under "Patient Instructions" section of medical record. This document was created in part using STT-dictation technology, any transcriptional errors that may result from this process are unintentional.  Patient: Joyce Brown Type: Established DOB: 1951-03-08 MRN: [id number] PCP: Ronnald Ramp, MD  Service: Procedure DOS: 04/04/2023 Setting: Ambulatory Location: Ambulatory outpatient facility Delivery: Face-to-face Provider: Edward Jolly, MD Specialty: Interventional Pain Management Specialty designation: 09 Location: Outpatient facility Ref. Prov.: Edward Jolly, MD       Interventional Therapy   Type: Lumbar Facet, Medial Branch Block(s) (w/ fluoroscopic mapping) #2  Laterality: Bilateral  Level: L3, L4, and L5 Medial Branch Level(s). Injecting these levels blocks the L3-4 and L4-5 lumbar facet joints. Imaging: Fluoroscopic guidance         Anesthesia: Local anesthesia (1-2% Lidocaine) Sedation:                         DOS: 04/04/2023 Performed by: Edward Jolly, MD  Primary Purpose: Diagnostic/Therapeutic Indications: Low back pain severe enough to impact quality of life or function. Lumbar facet arthopathy  NAS-11 Pain score:   Pre-procedure: 5 /10   Post-procedure: 5  (5/100 neck and back while standing)/10     Position / Prep / Materials:  Position: Prone  Prep solution: ChloraPrep (2% chlorhexidine gluconate and 70% isopropyl alcohol) Area Prepped: Posterolateral Lumbosacral Spine (Wide prep: From the lower border of the scapula down to the end of the tailbone and from flank to flank.)  Materials:  Tray: Block Needle(s):  Type: Spinal  Gauge (G): 22  Length: 3.5-in Qty: 2      H&P (Pre-op Assessment):  Joyce Brown is a 73 y.o. (year old), female patient, seen today for interventional  treatment. She  has a past surgical history that includes Gastric bypass (2014); Ankle surgery (Left); Bilateral carpal tunnel release; Adenoidectomy; Elbow surgery; Hand neuroplasty; Tonsillectomy; Abdominal hysterectomy; Breast biopsy (Left, 2019); radio denervation (05/21/2020); Small intestine surgery (08/18/2013); Eye surgery (03/14/22 & 03/17/22); Colon surgery (09/25/2012); Abdominal hysterectomy (1997); Nasal sinus surgery; Colonoscopy with propofol (N/A, 03/24/2023); and polypectomy (03/24/2023). Joyce Brown has a current medication list which includes the following prescription(s): acetaminophen, antiseptic oral rinse, atorvastatin, bd insulin syringe u/f 1/2unit, carboxymethylcellulose, dexcom g7 sensor, docusate sodium, estradiol, gabapentin, glucose blood, novolog penfill, omnipod 5 dexg7g6 pods gen 5, droplet pen needles, microlet lancets, miebo, mirabegron, multi-vitamin, omega 3, arexvy, sertraline, sodium fluoride, tizanidine, and twinrix. Her primarily concern today is the Back Pain (Lower and neck)  Initial Vital Signs:  Pulse/HCG Rate: 69  Temp: 97.7 F (36.5 C) Resp: 16 BP: (!) 135/58 SpO2: 98 %  BMI: Estimated body mass index is 24.97 kg/m as calculated from the following:   Height as of this encounter: 5\' 10"  (1.778 m).   Weight as of this encounter: 174 lb (78.9 kg).  Risk Assessment: Allergies: Reviewed. She has no known allergies.  Allergy Precautions: None required Coagulopathies: Reviewed. None identified.  Blood-thinner therapy: None at this time Active Infection(s): Reviewed. None identified. [name] is afebrile  Site Confirmation: Joyce Brown was asked to confirm the procedure and laterality before marking the site Procedure checklist: Completed Consent: Before the procedure and under the influence of no sedative(s), amnesic(s), or anxiolytics, the patient was informed of the treatment options, risks and possible complications. To fulfill our ethical and  legal obligations, as recommended by the American Medical Association's Code of  Ethics, I have informed the patient of my clinical impression; the nature and purpose of the treatment or procedure; the risks, benefits, and possible complications of the intervention; the alternatives, including doing nothing; the risk(s) and benefit(s) of the alternative treatment(s) or procedure(s); and the risk(s) and benefit(s) of doing nothing. The patient was provided information about the general risks and possible complications associated with the procedure. These may include, but are not limited to: failure to achieve desired goals, infection, bleeding, organ or nerve damage, allergic reactions, paralysis, and death. In addition, the patient was informed of those risks and complications associated to Spine-related procedures, such as failure to decrease pain; infection (i.e.: Meningitis, epidural or intraspinal abscess); bleeding (i.e.: epidural hematoma, subarachnoid hemorrhage, or any other type of intraspinal or peri-dural bleeding); organ or nerve damage (i.e.: Any type of peripheral nerve, nerve root, or spinal cord injury) with subsequent damage to sensory, motor, and/or autonomic systems, resulting in permanent pain, numbness, and/or weakness of one or several areas of the body; allergic reactions; (i.e.: anaphylactic reaction); and/or death. Furthermore, the patient was informed of those risks and complications associated with the medications. These include, but are not limited to: allergic reactions (i.e.: anaphylactic or anaphylactoid reaction(s)); adrenal axis suppression; blood sugar elevation that in diabetics may result in ketoacidosis or comma; water retention that in patients with history of congestive heart failure may result in shortness of breath, pulmonary edema, and decompensation with resultant heart failure; weight gain; swelling or edema; medication-induced neural toxicity; particulate matter  embolism and blood vessel occlusion with resultant organ, and/or nervous system infarction; and/or aseptic necrosis of one or more joints. Finally, the patient was informed that Medicine is not an exact science; therefore, there is also the possibility of unforeseen or unpredictable risks and/or possible complications that may result in a catastrophic outcome. The patient indicated having understood very clearly. We have given the patient no guarantees and we have made no promises. Enough time was given to the patient to ask questions, all of which were answered to the patient's satisfaction. Ms. Russak has indicated that she wanted to continue with the procedure. Attestation: I, the ordering provider, attest that I have discussed with the patient the benefits, risks, side-effects, alternatives, likelihood of achieving goals, and potential problems during recovery for the procedure that I have provided informed consent. Date  Time: 04/04/2023  8:42 AM   Pre-Procedure Preparation:  Monitoring: As per clinic protocol. Respiration, ETCO2, SpO2, BP, heart rate and rhythm monitor placed and checked for adequate function Safety Precautions: Patient was assessed for positional comfort and pressure points before starting the procedure. Time-out: I initiated and conducted the "Time-out" before starting the procedure, as per protocol. The patient was asked to participate by confirming the accuracy of the "Time Out" information. Verification of the correct person, site, and procedure were performed and confirmed by me, the nursing staff, and the patient. "Time-out" conducted as per Joint Commission's Universal Protocol (UP.01.01.01). Time: 1005 Start Time: 1005 hrs.  Description of Procedure:          Laterality: (see above) Targeted Levels: (see above)  Safety Precautions: Aspiration looking for blood return was conducted prior to all injections. At no point did we inject any substances, as a needle was  being advanced. Before injecting, the patient was told to immediately notify me if she was experiencing any new onset of "ringing in the ears, or metallic taste in the mouth". No attempts were made at seeking any paresthesias. Safe injection practices and  needle disposal techniques used. Medications properly checked for expiration dates. SDV (single dose vial) medications used. After the completion of the procedure, all disposable equipment used was discarded in the proper designated medical waste containers. Local Anesthesia: Protocol guidelines were followed. The patient was positioned over the fluoroscopy table. The area was prepped in the usual manner. The time-out was completed. The target area was identified using fluoroscopy. A 12-in long, straight, sterile hemostat was used with fluoroscopic guidance to locate the targets for each level blocked. Once located, the skin was marked with an approved surgical skin marker. Once all sites were marked, the skin (epidermis, dermis, and hypodermis), as well as deeper tissues (fat, connective tissue and muscle) were infiltrated with a small amount of a short-acting local anesthetic, loaded on a 10cc syringe with a 25G, 1.5-in  Needle. An appropriate amount of time was allowed for local anesthetics to take effect before proceeding to the next step. Local Anesthetic: Lidocaine 2.0% The unused portion of the local anesthetic was discarded in the proper designated containers. Technical description of process:  L3 Medial Branch Nerve Block (MBB): The target area for the L3 medial branch is at the junction of the postero-lateral aspect of the superior articular process and the superior, posterior, and medial edge of the transverse process of L4. Under fluoroscopic guidance, a Quincke needle was inserted until contact was made with os over the superior postero-lateral aspect of the pedicular shadow (target area). After negative aspiration for blood, 2mL of the nerve block  solution was injected without difficulty or complication. The needle was removed intact. L4 Medial Branch Nerve Block (MBB): The target area for the L4 medial branch is at the junction of the postero-lateral aspect of the superior articular process and the superior, posterior, and medial edge of the transverse process of L5. Under fluoroscopic guidance, a Quincke needle was inserted until contact was made with os over the superior postero-lateral aspect of the pedicular shadow (target area). After negative aspiration for blood, 2 mL of the nerve block solution was injected without difficulty or complication. The needle was removed intact. L5 Medial Branch Nerve Block (MBB): The target area for the L5 medial branch is at the junction of the postero-lateral aspect of the superior articular process and the superior, posterior, and medial edge of the sacral ala. Under fluoroscopic guidance, a Quincke needle was inserted until contact was made with os over the superior postero-lateral aspect of the pedicular shadow (target area). After negative aspiration for blood, 2 mL of the nerve block solution was injected without difficulty or complication. The needle was removed intact.   Once the entire procedure was completed, the treated area was cleaned, making sure to leave some of the prepping solution back to take advantage of its long term bactericidal properties.         Illustration of the posterior view of the lumbar spine and the posterior neural structures. Laminae of L2 through S1 are labeled. DPRL5, dorsal primary ramus of L5; DPRS1, dorsal primary ramus of S1; DPR3, dorsal primary ramus of L3; FJ, facet (zygapophyseal) joint L3-L4; I, inferior articular process of L4; LB1, lateral branch of dorsal primary ramus of L1; IAB, inferior articular branches from L3 medial branch (supplies L4-L5 facet joint); IBP, intermediate branch plexus; MB3, medial branch of dorsal primary ramus of L3; NR3, third lumbar  nerve root; S, superior articular process of L5; SAB, superior articular branches from L4 (supplies L4-5 facet joint also); TP3, transverse process of L3.  Facet Joint Innervation (* possible contribution)  L1-2 T12, L1 (L2*)  Medial Branch  L2-3 L1, L2 (L3*)         "          "  L3-4 L2, L3 (L4*)         "          "  L4-5 L3, L4 (L5*)         "          "  L5-S1 L4, L5, S1          "          "    Vitals:   04/04/23 0854 04/04/23 1000 04/04/23 1010 04/04/23 1020  BP: (!) 135/58 127/64 131/64 130/66  Pulse: 69 77 72 75  Resp:  16 15 16   Temp: 97.7 F (36.5 C)     SpO2: 98% (!) 76% (!) 72% (!) 71%  Weight: 174 lb (78.9 kg)     Height: 5\' 10"  (1.778 m)        End Time: 1019 hrs.  Imaging Guidance (Spinal):          Type of Imaging Technique: Fluoroscopy Guidance (Spinal) Indication(s): Fluoroscopy guidance for needle placement to enhance accuracy in procedures requiring precise needle localization for targeted delivery of medication in or near specific anatomical locations not easily accessible without such real-time imaging assistance. Exposure Time: Please see nurses notes. Contrast: None used. Fluoroscopic Guidance: I was personally present during the use of fluoroscopy. "Tunnel Vision Technique" used to obtain the best possible view of the target area. Parallax error corrected before commencing the procedure. "Direction-depth-direction" technique used to introduce the needle under continuous pulsed fluoroscopy. Once target was reached, antero-posterior, oblique, and lateral fluoroscopic projection used confirm needle placement in all planes. Images permanently stored in EMR. Interpretation: No contrast injected. I personally interpreted the imaging intraoperatively. Adequate needle placement confirmed in multiple planes. Permanent images saved into the patient's record.  Post-operative Assessment:  Post-procedure Vital Signs:  Pulse/HCG Rate: 75  Temp: 97.7 F (36.5  C) Resp: 16 BP: 130/66 SpO2: (!) 71 %  EBL: None  Complications: No immediate post-treatment complications observed by team, or reported by patient.  Note: The patient tolerated the entire procedure well. A repeat set of vitals were taken after the procedure and the patient was kept under observation following institutional policy, for this type of procedure. Post-procedural neurological assessment was performed, showing return to baseline, prior to discharge. The patient was provided with post-procedure discharge instructions, including a section on how to identify potential problems. Should any problems arise concerning this procedure, the patient was given instructions to immediately contact us, at any time, without hesitation. In any case, we plan to contact the patient by telephone for a follow-up status report regarding this interventional procedure.  Comments:  No additional relevant information.  Plan of Care (POC)  Orders:  Orders Placed This Encounter  Procedures   DG PAIN [facility] C-ARM 1-60 MIN NO REPORT    Intraoperative interpretation by procedural physician at [facility].    Standing Status:   Standing    Number of Occurrences:   1    Reason for exam::   Assistance in needle guidance and placement for procedures requiring needle placement in or near specific anatomical locations not easily accessible without such assistance.     Medications ordered for procedure: Meds ordered this encounter  Medications   ropivacaine (PF) 2 mg/mL (0.2%) (NAROPIN) injection  1 mL   sodium chloride flush (NS) 0.9 % injection 1 mL   dexamethasone (DECADRON) injection 10 mg   ropivacaine (PF) 2 mg/mL (0.2%) (NAROPIN) injection 18 mL   dexamethasone (DECADRON) injection 20 mg   Medications administered: We administered ropivacaine (PF) 2 mg/mL (0.2%), sodium chloride flush, dexamethasone, ropivacaine (PF) 2 mg/mL (0.2%), and dexamethasone.  See the medical record for exact  dosing, route, and time of administration.  Follow-up plan:   Return in about 2 weeks (around 04/18/2023) for PPE, VV.       B/L [facility] injection and Cervical TPI; B/L L3,4,5 MBNB 02/09/27     Recent Visits Date Type Provider Dept  03/02/23 Office Visit Edward Jolly, MD [facility]  02/09/23 Procedure visit Edward Jolly, MD [facility]  Showing recent visits within past 90 days and meeting all other requirements Today's Visits Date Type Provider Dept  04/04/23 Procedure visit Edward Jolly, MD [facility]  Showing today's visits and meeting all other requirements Future Appointments Date Type Provider Dept  04/18/23 Appointment Edward Jolly, MD [facility]  Showing future appointments within next 90 days and meeting all other requirements  Disposition: Discharge home  Discharge (Date  Time): 04/04/2023; 1023 hrs.   Primary Care Physician: Ronnald Ramp, MD Location: [facility] Note by: Edward Jolly, MD (TTS technology used. I apologize for any typographical errors that were not detected and corrected.) Date: 04/04/2023; Time: 12:22 PM  Disclaimer:  Medicine is not an Visual merchandiser. The only guarantee in medicine is that nothing is guaranteed. It is important to note that the decision to proceed with this intervention was based on the information collected from the patient. The Data and conclusions were drawn from the patient's questionnaire, the interview, and the physical examination. Because the information was provided in large part by the patient, it cannot be guaranteed that it has not been purposely or unconsciously manipulated. Every effort has been made to obtain as much relevant data as possible for this evaluation. It is important to note that the conclusions that lead to this procedure are derived in large part from the available data. Always take into account that the treatment will also be  dependent on availability of resources and existing treatment guidelines, considered by other Pain Management Practitioners as being common knowledge and practice, at the time of the intervention. For Medico-Legal purposes, it is also important to point out that variation in procedural techniques and pharmacological choices are the acceptable norm. The indications, contraindications, technique, and results of the above procedure should only be interpreted and judged by a Board-Certified Interventional Pain Specialist with extensive familiarity and expertise in the same exact procedure and technique.

## 2023-04-04 NOTE — Progress Notes (Signed)
PROVIDER NOTE: Interpretation of information contained herein should be left to medically-trained personnel. Specific patient instructions are provided elsewhere under "Patient Instructions" section of medical record. This document was created in part using STT-dictation technology, any transcriptional errors that may result from this process are unintentional.  Patient: Joyce Brown Type: Established DOB: 09-29-1950 MRN: [id number] PCP: Ronnald Ramp, MD  Service: Procedure DOS: 04/04/2023 Setting: Ambulatory Location: Ambulatory outpatient facility Delivery: Face-to-face Provider: Edward Jolly, MD Specialty: Interventional Pain Management Specialty designation: 09 Location: Outpatient facility Ref. Prov.: Edward Jolly, MD       Interventional Therapy   Procedure: Cervical Epidural Steroid injection (CESI) (Interlaminar) #1  Laterality: Right  Level: C7-T1 Imaging: Fluoroscopy-assisted DOS: 04/04/2023  Performed by: Edward Jolly, MD Anesthesia: Local anesthesia (1-2% Lidocaine)   Purpose: Diagnostic/Therapeutic Indications: Cervicalgia, cervical radicular pain, degenerative disc disease, severe enough to impact quality of life or function. Cervical Radicular Pain  NAS-11 score:   Pre-procedure: 5 /10   Post-procedure: 5  (5/100 neck and back while standing)/10      Position  Prep  Materials:  Location setting: Procedure suite Position: Prone, on modified reverse trendelenburg to facilitate breathing, with head in head-cradle. Pillows positioned under chest (below chin-level) with cervical spine flexed. Safety Precautions: Patient was assessed for positional comfort and pressure points before starting the procedure. Prepping solution: DuraPrep (Iodine Povacrylex [0.7% available iodine] and Isopropyl Alcohol, 74% w/w) Prep Area: Entire  cervicothoracic region Approach: percutaneous, paramedial Intended target: Posterior cervical epidural space Materials  Procedure:  Tray: Epidural Needle(s): Epidural (Tuohy) Qty: 1 Length: (90mm) 3.5-inch Gauge: 22G   H&P (Pre-op Assessment):  Joyce Brown is a 73 y.o. (year old), female patient, seen today for interventional treatment. She  has a past surgical history that includes Gastric bypass (2014); Ankle surgery (Left); Bilateral carpal tunnel release; Adenoidectomy; Elbow surgery; Hand neuroplasty; Tonsillectomy; Abdominal hysterectomy; Breast biopsy (Left, 2019); radio denervation (05/21/2020); Small intestine surgery (08/18/2013); Eye surgery (03/14/22 & 03/17/22); Colon surgery (09/25/2012); Abdominal hysterectomy (1997); Nasal sinus surgery; Colonoscopy with propofol (N/A, 03/24/2023); and polypectomy (03/24/2023). Joyce Brown has a current medication list which includes the following prescription(s): acetaminophen, antiseptic oral rinse, atorvastatin, bd insulin syringe u/f 1/2unit, carboxymethylcellulose, dexcom g7 sensor, docusate sodium, estradiol, gabapentin, glucose blood, novolog penfill, omnipod 5 dexg7g6 pods gen 5, droplet pen needles, microlet lancets, miebo, mirabegron, multi-vitamin, omega 3, arexvy, sertraline, sodium fluoride, tizanidine, and twinrix. Her primarily concern today is the Back Pain (Lower and neck)  Initial Vital Signs:  Pulse/HCG Rate: 69  Temp: 97.7 F (36.5 C) Resp: 16 BP: (!) 135/58 SpO2: 98 %  BMI: Estimated body mass index is 24.97 kg/m as calculated from the following:   Height as of this encounter: 5\' 10"  (1.778 m).   Weight as of this encounter: 174 lb (78.9 kg).  Risk Assessment: Allergies: Reviewed. She has no known allergies.  Allergy Precautions: None required Coagulopathies: Reviewed. None identified.  Blood-thinner therapy: None at this time Active Infection(s): Reviewed. None identified. Joyce Brown is afebrile  Site Confirmation: Joyce Brown was asked to confirm the procedure and laterality before marking the site Procedure checklist:  Completed Consent: Before the procedure and under the influence of no sedative(s), amnesic(s), or anxiolytics, the patient was informed of the treatment options, risks and possible complications. To fulfill our ethical and legal obligations, as recommended by the American Medical Association's Code of Ethics, I have informed the patient of my clinical impression; the nature and purpose of the treatment or procedure; the risks, benefits, and  possible complications of the intervention; the alternatives, including doing nothing; the risk(s) and benefit(s) of the alternative treatment(s) or procedure(s); and the risk(s) and benefit(s) of doing nothing. The patient was provided information about the general risks and possible complications associated with the procedure. These may include, but are not limited to: failure to achieve desired goals, infection, bleeding, organ or nerve damage, allergic reactions, paralysis, and death. In addition, the patient was informed of those risks and complications associated to Spine-related procedures, such as failure to decrease pain; infection (i.e.: Meningitis, epidural or intraspinal abscess); bleeding (i.e.: epidural hematoma, subarachnoid hemorrhage, or any other type of intraspinal or peri-dural bleeding); organ or nerve damage (i.e.: Any type of peripheral nerve, nerve root, or spinal cord injury) with subsequent damage to sensory, motor, and/or autonomic systems, resulting in permanent pain, numbness, and/or weakness of one or several areas of the body; allergic reactions; (i.e.: anaphylactic reaction); and/or death. Furthermore, the patient was informed of those risks and complications associated with the medications. These include, but are not limited to: allergic reactions (i.e.: anaphylactic or anaphylactoid reaction(s)); adrenal axis suppression; blood sugar elevation that in diabetics may result in ketoacidosis or comma; water retention that in patients with history  of congestive heart failure may result in shortness of breath, pulmonary edema, and decompensation with resultant heart failure; weight gain; swelling or edema; medication-induced neural toxicity; particulate matter embolism and blood vessel occlusion with resultant organ, and/or nervous system infarction; and/or aseptic necrosis of one or more joints. Finally, the patient was informed that Medicine is not an exact science; therefore, there is also the possibility of unforeseen or unpredictable risks and/or possible complications that may result in a catastrophic outcome. The patient indicated having understood very clearly. We have given the patient no guarantees and we have made no promises. Enough time was given to the patient to ask questions, all of which were answered to the patient's satisfaction. Ms. Jagiello has indicated that she wanted to continue with the procedure. Attestation: I, the ordering provider, attest that I have discussed with the patient the benefits, risks, side-effects, alternatives, likelihood of achieving goals, and potential problems during recovery for the procedure that I have provided informed consent. Date  Time: 04/04/2023  8:42 AM   Pre-Procedure Preparation:  Monitoring: As per clinic protocol. Respiration, ETCO2, SpO2, BP, heart rate and rhythm monitor placed and checked for adequate function Safety Precautions: Patient was assessed for positional comfort and pressure points before starting the procedure. Time-out: I initiated and conducted the "Time-out" before starting the procedure, as per protocol. The patient was asked to participate by confirming the accuracy of the "Time Out" information. Verification of the correct person, site, and procedure were performed and confirmed by me, the nursing staff, and the patient. "Time-out" conducted as per Joint Commission's Universal Protocol (UP.01.01.01). Time: 1005 Start Time: 1005 hrs.  Description  Narrative of  Procedure:          Rationale (medical necessity): procedure needed and proper for the diagnosis and/or treatment of the patient's medical symptoms and needs. Start Time: 1005 hrs. Safety Precautions: Aspiration looking for blood return was conducted prior to all injections. At no point did we inject any substances, as a needle was being advanced. No attempts were made at seeking any paresthesias. Safe injection practices and needle disposal techniques used. Medications properly checked for expiration dates. SDV (single dose vial) medications used. Description of procedure: Protocol guidelines were followed. The patient was assisted into a comfortable position. The target area  was identified and the area prepped in the usual manner. Skin & deeper tissues infiltrated with local anesthetic. Appropriate amount of time allowed to pass for local anesthetics to take effect. Using fluoroscopic guidance, the epidural needle was introduced through the skin, ipsilateral to the reported pain, and advanced to the target area. Posterior laminar os was contacted and the needle walked caudad, until the lamina was cleared. The ligamentum flavum was engaged and the epidural space identified using "loss-of-resistance technique" with 2-3 ml of PF-NaCl (0.9% NSS), in a 5cc dedicated LOR syringe. (See "Imaging guidance" below for use of contrast details.) Once proper needle placement was secured, and negative aspiration confirmed, the solution was injected in intermittent fashion, asking for systemic symptoms every 0.5cc. The needles were then removed and the area cleansed, making sure to leave some of the prepping solution back to take advantage of its long term bactericidal properties.  3 cc solution made of 1 cc of preservative-free saline, 1 cc of 0.2% ropivacaine, 1 cc of Decadron 10 mg/cc.   Vitals:   04/04/23 0854 04/04/23 1000 04/04/23 1010 04/04/23 1020  BP: (!) 135/58 127/64 131/64 130/66  Pulse: 69 77 72 75   Resp:  16 15 16   Temp: 97.7 F (36.5 C)     SpO2: 98% (!) 76% (!) 72% (!) 71%  Weight: 174 lb (78.9 kg)     Height: 5\' 10"  (1.778 m)        End Time: 1019 hrs.  Imaging Guidance (Spinal):          Type of Imaging Technique: Fluoroscopy Guidance (Spinal) Indication(s): Fluoroscopy guidance for needle placement to enhance accuracy in procedures requiring precise needle localization for targeted delivery of medication in or near specific anatomical locations not easily accessible without such real-time imaging assistance. Exposure Time: Please see nurses notes. Contrast: Before injecting any contrast, we confirmed that the patient did not have an allergy to iodine, shellfish, or radiological contrast. Once satisfactory needle placement was completed at the desired level, radiological contrast was injected. Contrast injected under live fluoroscopy. No contrast complications. See chart for type and volume of contrast used. Fluoroscopic Guidance: I was personally present during the use of fluoroscopy. "Tunnel Vision Technique" used to obtain the best possible view of the target area. Parallax error corrected before commencing the procedure. "Direction-depth-direction" technique used to introduce the needle under continuous pulsed fluoroscopy. Once target was reached, antero-posterior, oblique, and lateral fluoroscopic projection used confirm needle placement in all planes. Images permanently stored in EMR. Interpretation: I personally interpreted the imaging intraoperatively. Adequate needle placement confirmed in multiple planes. Appropriate spread of contrast into desired area was observed. No evidence of afferent or efferent intravascular uptake. No intrathecal or subarachnoid spread observed. Permanent images saved into the patient's record.  Post-operative Assessment:  Post-procedure Vital Signs:  Pulse/HCG Rate: 75  Temp: 97.7 F (36.5 C) Resp: 16 BP: 130/66 SpO2: (!) 71 %  EBL:  None  Complications: No immediate post-treatment complications observed by team, or reported by patient.  Note: The patient tolerated the entire procedure well. A repeat set of vitals were taken after the procedure and the patient was kept under observation following institutional policy, for this type of procedure. Post-procedural neurological assessment was performed, showing return to baseline, prior to discharge. The patient was provided with post-procedure discharge instructions, including a section on how to identify potential problems. Should any problems arise concerning this procedure, the patient was given instructions to immediately contact us, at any time, without hesitation.  In any case, we plan to contact the patient by telephone for a follow-up status report regarding this interventional procedure.  Comments:  No additional relevant information.  Plan of Care (POC)  Orders:  Orders Placed This Encounter  Procedures   DG PAIN CLINIC C-ARM 1-60 MIN NO REPORT    Intraoperative interpretation by procedural physician at [facility].    Standing Status:   Standing    Number of Occurrences:   1    Reason for exam::   Assistance in needle guidance and placement for procedures requiring needle placement in or near specific anatomical locations not easily accessible without such assistance.     Medications ordered for procedure: Meds ordered this encounter  Medications   ropivacaine (PF) 2 mg/mL (0.2%) (NAROPIN) injection 1 mL   sodium chloride flush (NS) 0.9 % injection 1 mL   dexamethasone (DECADRON) injection 10 mg   ropivacaine (PF) 2 mg/mL (0.2%) (NAROPIN) injection 18 mL   dexamethasone (DECADRON) injection 20 mg   Medications administered: We administered ropivacaine (PF) 2 mg/mL (0.2%), sodium chloride flush, dexamethasone, ropivacaine (PF) 2 mg/mL (0.2%), and dexamethasone.  See the medical record for exact dosing, route, and time of  administration.  Follow-up plan:   Return in about 2 weeks (around 04/18/2023) for PPE, VV.       B/L [facility] injection and Cervical TPI; B/L L3,4,5 MBNB 02/09/24, 04/04/23. C-ESI 04/04/23       Recent Visits Date Type Provider Dept  03/02/23 Office Visit Edward Jolly, MD [facility]  02/09/23 Procedure visit Edward Jolly, MD [facility]  Showing recent visits within past 90 days and meeting all other requirements Today's Visits Date Type Provider Dept  04/04/23 Procedure visit Edward Jolly, MD [facility]  Showing today's visits and meeting all other requirements Future Appointments Date Type Provider Dept  04/18/23 Appointment Edward Jolly, MD [facility]  Showing future appointments within next 90 days and meeting all other requirements  Disposition: Discharge home  Discharge (Date  Time): 04/04/2023; 1023 hrs.   Primary Care Physician: Ronnald Ramp, MD Location: [facility] Note by: Edward Jolly, MD (TTS technology used. I apologize for any typographical errors that were not detected and corrected.) Date: 04/04/2023; Time: 12:26 PM  Disclaimer:  Medicine is not an Visual merchandiser. The only guarantee in medicine is that nothing is guaranteed. It is important to note that the decision to proceed with this intervention was based on the information collected from the patient. The Data and conclusions were drawn from the patient's questionnaire, the interview, and the physical examination. Because the information was provided in large part by the patient, it cannot be guaranteed that it has not been purposely or unconsciously manipulated. Every effort has been made to obtain as much relevant data as possible for this evaluation. It is important to note that the conclusions that lead to this procedure are derived in large part from the available data. Always take into account that the treatment will also be  dependent on availability of resources and existing treatment guidelines, considered by other Pain Management Practitioners as being common knowledge and practice, at the time of the intervention. For Medico-Legal purposes, it is also important to point out that variation in procedural techniques and pharmacological choices are the acceptable norm. The indications, contraindications, technique, and results of the above procedure should only be interpreted and judged by a Board-Certified Interventional Pain Specialist with extensive familiarity and expertise in the same  exact procedure and technique.

## 2023-04-04 NOTE — Patient Instructions (Signed)

## 2023-04-05 ENCOUNTER — Telehealth: Payer: Self-pay

## 2023-04-05 NOTE — Telephone Encounter (Signed)
Attempt to call patient for post-procedure status check. No answer and left voicemail message.

## 2023-04-07 ENCOUNTER — Ambulatory Visit: Payer: Medicare Other | Admitting: Podiatry

## 2023-04-10 ENCOUNTER — Other Ambulatory Visit: Payer: Self-pay

## 2023-04-10 ENCOUNTER — Observation Stay
Admission: EM | Admit: 2023-04-10 | Discharge: 2023-04-12 | Disposition: A | Discharge status: Home / Self Care | Payer: Medicare Other | Attending: Internal Medicine | Admitting: Internal Medicine

## 2023-04-10 ENCOUNTER — Emergency Department: Payer: Medicare Other

## 2023-04-10 DIAGNOSIS — Z79899 Other long term (current) drug therapy: Secondary | ICD-10-CM | POA: Insufficient documentation

## 2023-04-10 DIAGNOSIS — E11649 Type 2 diabetes mellitus with hypoglycemia without coma: Secondary | ICD-10-CM | POA: Diagnosis not present

## 2023-04-10 DIAGNOSIS — E162 Hypoglycemia, unspecified: Principal | ICD-10-CM | POA: Diagnosis present

## 2023-04-10 DIAGNOSIS — E876 Hypokalemia: Secondary | ICD-10-CM | POA: Diagnosis not present

## 2023-04-10 DIAGNOSIS — R918 Other nonspecific abnormal finding of lung field: Secondary | ICD-10-CM | POA: Diagnosis not present

## 2023-04-10 DIAGNOSIS — E785 Hyperlipidemia, unspecified: Secondary | ICD-10-CM | POA: Diagnosis present

## 2023-04-10 DIAGNOSIS — I6623 Occlusion and stenosis of bilateral posterior cerebral arteries: Secondary | ICD-10-CM | POA: Diagnosis not present

## 2023-04-10 DIAGNOSIS — I10 Essential (primary) hypertension: Secondary | ICD-10-CM | POA: Diagnosis present

## 2023-04-10 DIAGNOSIS — R410 Disorientation, unspecified: Secondary | ICD-10-CM | POA: Diagnosis not present

## 2023-04-10 DIAGNOSIS — I6503 Occlusion and stenosis of bilateral vertebral arteries: Secondary | ICD-10-CM | POA: Diagnosis not present

## 2023-04-10 DIAGNOSIS — I700 Atherosclerosis of aorta: Secondary | ICD-10-CM | POA: Diagnosis not present

## 2023-04-10 DIAGNOSIS — J439 Emphysema, unspecified: Secondary | ICD-10-CM | POA: Diagnosis not present

## 2023-04-10 DIAGNOSIS — E1042 Type 1 diabetes mellitus with diabetic polyneuropathy: Secondary | ICD-10-CM | POA: Diagnosis present

## 2023-04-10 DIAGNOSIS — R4182 Altered mental status, unspecified: Secondary | ICD-10-CM | POA: Diagnosis not present

## 2023-04-10 DIAGNOSIS — G9340 Encephalopathy, unspecified: Secondary | ICD-10-CM

## 2023-04-10 DIAGNOSIS — F4323 Adjustment disorder with mixed anxiety and depressed mood: Secondary | ICD-10-CM | POA: Diagnosis present

## 2023-04-10 DIAGNOSIS — R4781 Slurred speech: Secondary | ICD-10-CM | POA: Diagnosis not present

## 2023-04-10 DIAGNOSIS — D72829 Elevated white blood cell count, unspecified: Secondary | ICD-10-CM | POA: Diagnosis not present

## 2023-04-10 DIAGNOSIS — J45909 Unspecified asthma, uncomplicated: Secondary | ICD-10-CM | POA: Diagnosis present

## 2023-04-10 DIAGNOSIS — G4733 Obstructive sleep apnea (adult) (pediatric): Secondary | ICD-10-CM

## 2023-04-10 DIAGNOSIS — I6529 Occlusion and stenosis of unspecified carotid artery: Secondary | ICD-10-CM | POA: Diagnosis not present

## 2023-04-10 DIAGNOSIS — R404 Transient alteration of awareness: Secondary | ICD-10-CM | POA: Diagnosis not present

## 2023-04-10 DIAGNOSIS — R531 Weakness: Secondary | ICD-10-CM | POA: Diagnosis not present

## 2023-04-10 LAB — URINALYSIS, ROUTINE W REFLEX MICROSCOPIC
Bacteria, UA: NONE SEEN
Bilirubin Urine: NEGATIVE
Glucose, UA: >=500 mg/dL — AB
Hgb urine dipstick: NEGATIVE
Ketones, ur: NEGATIVE mg/dL
Leukocytes,Ua: NEGATIVE
Nitrite: NEGATIVE
Protein, ur: NEGATIVE mg/dL
Specific Gravity, Urine: >1.046 — ABNORMAL HIGH (ref 1.005–1.030)
Squamous Epithelial / HPF: 0 /[HPF] (ref 0–5)
pH: 5 (ref 5.0–8.0)

## 2023-04-10 LAB — CBC WITH DIFFERENTIAL/PLATELET
Abs Immature Granulocytes: 0.05 10*3/uL (ref 0.00–0.07)
Basophils Absolute: 0.1 10*3/uL (ref 0.0–0.1)
Basophils Relative: 1 %
Eosinophils Absolute: 0.1 10*3/uL (ref 0.0–0.5)
Eosinophils Relative: 1 %
HCT: 40.7 % (ref 36.0–46.0)
Hemoglobin: 13.2 g/dL (ref 12.0–15.0)
Immature Granulocytes: 0 %
Lymphocytes Relative: 12 %
Lymphs Abs: 1.6 10*3/uL (ref 0.7–4.0)
MCH: 30.8 pg (ref 26.0–34.0)
MCHC: 32.4 g/dL (ref 30.0–36.0)
MCV: 95.1 fL (ref 80.0–100.0)
Monocytes Absolute: 1.1 10*3/uL — ABNORMAL HIGH (ref 0.1–1.0)
Monocytes Relative: 9 %
Neutro Abs: 10.2 10*3/uL — ABNORMAL HIGH (ref 1.7–7.7)
Neutrophils Relative %: 77 %
Platelets: 262 10*3/uL (ref 150–400)
RBC: 4.28 MIL/uL (ref 3.87–5.11)
RDW: 13.3 % (ref 11.5–15.5)
WBC: 13.2 10*3/uL — ABNORMAL HIGH (ref 4.0–10.5)
nRBC: 0 % (ref 0.0–0.2)

## 2023-04-10 LAB — COMPREHENSIVE METABOLIC PANEL
ALT: 48 U/L — ABNORMAL HIGH (ref 0–44)
AST: 35 U/L (ref 15–41)
Albumin: 3.9 g/dL (ref 3.5–5.0)
Alkaline Phosphatase: 68 U/L (ref 38–126)
Anion gap: 11 (ref 5–15)
BUN: 22 mg/dL (ref 8–23)
CO2: 24 mmol/L (ref 22–32)
Calcium: 9.2 mg/dL (ref 8.9–10.3)
Chloride: 107 mmol/L (ref 98–111)
Creatinine, Ser: 0.77 mg/dL (ref 0.44–1.00)
GFR, Estimated: >60 mL/min (ref >60)
Glucose, Bld: 53 mg/dL — ABNORMAL LOW (ref 70–99)
Potassium: 3.4 mmol/L — ABNORMAL LOW (ref 3.5–5.1)
Sodium: 142 mmol/L (ref 135–145)
Total Bilirubin: 0.4 mg/dL (ref 0.0–1.2)
Total Protein: 6.8 g/dL (ref 6.5–8.1)

## 2023-04-10 LAB — CBG MONITORING, ED
Glucose-Capillary: 141 mg/dL — ABNORMAL HIGH (ref 70–99)
Glucose-Capillary: 59 mg/dL — ABNORMAL LOW (ref 70–99)
Glucose-Capillary: <10 mg/dL — CL (ref 70–99)
Glucose-Capillary: 115 mg/dL — ABNORMAL HIGH (ref 70–99)
Glucose-Capillary: 85 mg/dL (ref 70–99)
Glucose-Capillary: 73 mg/dL (ref 70–99)
Glucose-Capillary: 238 mg/dL — ABNORMAL HIGH (ref 70–99)

## 2023-04-10 LAB — PHOSPHORUS: Phosphorus: 3.5 mg/dL (ref 2.5–4.6)

## 2023-04-10 LAB — LACTIC ACID, PLASMA: Lactic Acid, Venous: 1.6 mmol/L (ref 0.5–1.9)

## 2023-04-10 LAB — TROPONIN I (HIGH SENSITIVITY): Troponin I (High Sensitivity): 8 ng/L (ref <18)

## 2023-04-10 MED ORDER — IOHEXOL 350 MG/ML SOLN
75.0000 mL | Freq: Once | INTRAVENOUS | Status: AC | PRN
  Administered 2023-04-10: 75 mL via INTRAVENOUS

## 2023-04-10 MED ORDER — ONDANSETRON HCL 4 MG/2ML IJ SOLN: 4.0000 mg | Freq: Three times a day (TID) | INTRAMUSCULAR | Status: DC | PRN

## 2023-04-10 MED ORDER — DM-GUAIFENESIN ER 30-600 MG PO TB12: 1.0000 | ORAL_TABLET | Freq: Two times a day (BID) | ORAL | Status: DC | PRN

## 2023-04-10 MED ORDER — DEXTROSE 50 % IV SOLN
25.0000 mL | Freq: Once | INTRAVENOUS | Status: AC
  Administered 2023-04-10: 25 mL via INTRAVENOUS
  Filled 2023-04-10: qty 50

## 2023-04-10 MED ORDER — DEXTROSE 50 % IV SOLN
INTRAVENOUS | Status: AC
  Administered 2023-04-10: 50 mL via INTRAVENOUS
  Filled 2023-04-10: qty 50

## 2023-04-10 MED ORDER — DEXTROSE 50 % IV SOLN: 50.0000 mL | INTRAVENOUS | Status: DC | PRN

## 2023-04-10 MED ORDER — HYDRALAZINE HCL 20 MG/ML IJ SOLN: 5.0000 mg | INTRAMUSCULAR | Status: DC | PRN

## 2023-04-10 MED ORDER — DEXTROSE 50 % IV SOLN
1.0000 | Freq: Once | INTRAVENOUS | Status: AC
Start: 2023-04-10 — End: 2023-04-10

## 2023-04-10 MED ORDER — POTASSIUM CHLORIDE CRYS ER 20 MEQ PO TBCR
40.0000 meq | EXTENDED_RELEASE_TABLET | Freq: Once | ORAL | Status: AC
  Administered 2023-04-10: 40 meq via ORAL
  Filled 2023-04-10: qty 2

## 2023-04-10 MED ORDER — DEXTROSE 50 % IV SOLN: 1.0000 | Freq: Once | INTRAVENOUS | Status: AC

## 2023-04-10 MED ORDER — DEXTROSE 10 % IV SOLN: INTRAVENOUS | Status: DC

## 2023-04-10 MED ORDER — ACETAMINOPHEN 325 MG PO TABS
650.0000 mg | ORAL_TABLET | Freq: Four times a day (QID) | ORAL | Status: DC | PRN
  Administered 2023-04-10 – 2023-04-11 (×2): 650 mg via ORAL
  Filled 2023-04-10 (×2): qty 2

## 2023-04-10 MED ORDER — ALBUTEROL SULFATE (2.5 MG/3ML) 0.083% IN NEBU: 2.5000 mg | INHALATION_SOLUTION | RESPIRATORY_TRACT | Status: DC | PRN

## 2023-04-10 NOTE — ED Notes (Signed)
Arrived in [place].

## 2023-04-10 NOTE — H&P (Signed)
History and Physical    Joyce Brown ZOX:[id number] DOB: 09/16/1950 DOA: 04/10/2023  Referring MD/NP/PA:   PCP: Ronnald Ramp, MD   Patient coming from:  The patient is coming from home.     Chief Complaint: Hypoglycemia and altered mental status.  HPI: Joyce Brown is a 73 y.o. female with medical history significant of type 1 diabetes using insulin pump, hypertension, hyperlipidemia, adjustment disorder, cerebral ataxia, spinal stenosis with chronic back pain, who presents with hypoglycemia.  Per report, patient was last known normal at about 11:00 today. Pt was talking to family around 3:30 when she became increasingly drowsy and altered. Per EMS, pt was found to have hypoglycemia with blood sugar 31.  Patient was given glucagon by EMS.  He received totally 75 cc of D50 in ED, still has persistent hypoglycemia.  D10 was started in ED, with improvement of blood sugar.  When I saw patient in ED, her mental status has come back to normal.  She is alert oriented x 3.  She moves all extremities normally.  She denies any chest pain, cough, SOB.  No nausea, vomiting, diarrhea or abdominal pain.  No symptoms of UTI.  Patient reported that she ran out of insulin pump sensor since January 1, she has been manually adjusting the insulin pump.   Data reviewed independently and ED Course: pt was found to have WBC 13.2, potassium 3.4, GFR> 60, negative urinalysis, troponin 8, lactic acid 1.6, temperature normal, blood pressure 166/69, heart rate 76, RR 26.  Chest x-ray negative.  CTA of neck and head negative for LVO.  Patient is placed on PCU for observation.   EKG: I have personally reviewed.  Sinus rhythm, QTc 446, no ischemic change.   Review of Systems:   General: no fevers, chills, no body weight gain, has fatigue HEENT: no blurry vision, hearing changes or sore throat Respiratory: no dyspnea, coughing, wheezing CV: no chest pain, no palpitations GI: no nausea,  vomiting, abdominal pain, diarrhea, constipation GU: no dysuria, burning on urination, increased urinary frequency, hematuria  Ext: no leg edema Neuro: no unilateral weakness, numbness, or tingling, no vision change or hearing loss.  Had confusion Skin: no rash, no skin tear. MSK: No muscle spasm, no deformity, no limitation of range of movement in spin Heme: No easy bruising.  Travel history: No recent long distant travel.   Allergy: No Known Allergies  Past Medical History:  Diagnosis Date   Arthritis 1995   Have family history of it.   Asthma 1995   Has resolved   Cataract 2017   Family History of them   Cerebellar ataxia (HCC)    Depression 1996   Due to Fibromyalgia.   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   Glaucoma Various   Family History of it.   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 ENDOSCOPY;  Service: Gastroenterology;  Laterality: N/A;   ELBOW SURGERY     EYE SURGERY  03/14/22 & 03/17/22   Cataract Surgery   GASTRIC BYPASS  2014   HAND  NEUROPLASTY     NASAL SINUS SURGERY     POLYPECTOMY  03/24/2023   Procedure: POLYPECTOMY;  Surgeon: Toney Reil, MD;  Location: [facility] ENDOSCOPY;  Service: Gastroenterology;;   radio denervation  05/21/2020   SMALL INTESTINE SURGERY  08/18/2013   Laparoscopic Procedure to Relieve Small Bowel Obstruction   TONSILLECTOMY      Social History:  reports that she has never smoked. She has never been exposed to tobacco smoke. She has never used smokeless tobacco. She reports that she does not drink alcohol and does not use drugs.  Family History:  Family History  Problem Relation Age of Onset   Cervical cancer Mother    Ovarian cancer Mother    Arthritis Mother    Cancer Mother    Dementia Father    Retinal detachment Father    Heart attack Father    Diabetes Father    Stroke Father    Hypertension Father    Alcohol abuse Father    Arthritis Father    Hearing loss Father    Hyperlipidemia Father    Ovarian cancer Sister    Arthritis Sister    Arthritis Sister    Arthritis Sister    Arthritis Sister    Glaucoma Paternal Grandfather    Arthritis Paternal Grandfather    Depression Paternal Grandfather    Hearing loss Paternal Grandfather    Diabetes Paternal Grandmother    Arthritis Sister    Arthritis Sister    Cancer Sister    Diabetes Maternal Uncle    Vision loss Maternal Uncle      Prior to Admission medications   Medication Sig Start Date End Date Taking? Authorizing Provider  Acetaminophen 500 MG capsule Take 500 mg by mouth every 6 (six) hours as needed for moderate pain.    [provider]  antiseptic oral rinse (BIOTENE) LIQD 15 mLs by Mouth Rinse route in the morning, at noon, in the evening, and at bedtime.    [provider]  atorvastatin (LIPITOR) 80 MG tablet Take 1 tablet (80 mg total) by mouth daily. 08/17/22   Carney Living, MD  BD INSULIN SYRINGE U/F 1/2UNIT 31G X 5/16" 0.3 ML MISC USE 3 TIMES A DAY 11/04/21   Shamleffer, Konrad Dolores, MD  carboxymethylcellulose (REFRESH PLUS) 0.5 % SOLN Place 1 drop into both eyes 3 (three) times daily as needed.    [provider]  Continuous Blood Gluc Sensor (DEXCOM G7 SENSOR) MISC by Does not apply route.    [provider]  docusate sodium (COLACE) 100 MG capsule Take 100 mg by mouth daily as needed for mild constipation.    [provider]  estradiol (ESTRACE) 0.1 MG/GM vaginal cream Place 1 Applicatorful vaginally 2 (two) times a week.    [provider]  gabapentin (NEURONTIN) 100 MG capsule Take 2 capsules (200 mg total) by mouth 3 (three) times  daily. 01/11/23   Simmons-Robinson, Makiera, MD  glucose blood test strip Use    [provider]  insulin aspart (NOVOLOG PENFILL) cartridge USE MAX OF DAILY DOSE OF 60 UNITS PER CORRECTION SCALE AS DIRECTED Patient taking differently: USE MAX OF DAILY DOSE OF 60 UNITS PER CORRECTION SCALE AS DIRECTED Taking 8 units before breakfast; 10 units before lunch; 7 units before dinner 09/06/22   Shamleffer, Konrad Dolores, MD  Insulin Disposable Pump (OMNIPOD 5 DEXG7G6 PODS GEN 5) MISC SMARTSIG:SUB-Q Every 3 Days    [provider]  Insulin Pen Needle (DROPLET PEN NEEDLES) 31G X 5 MM MISC USE 4 TIMES DAILY AS DIRECTED BY YOUR DOCTOR 01/26/23   Shamleffer, Konrad Dolores, MD  Microlet Lancets MISC 3 (three) times daily. 08/26/17   [provider]  MIEBO 1.338 GM/ML SOLN Apply 1 drop to eye 4 (four) times daily. 10/29/22   [provider]  Mirabegron (MYRBETRIQ PO) Take by mouth.    [provider]  Multiple Vitamin (MULTI-VITAMIN) tablet Take 2 tablets by mouth daily.    [provider]  Omega 3 1200 MG CAPS Take by mouth. One daily    [provider]  RSV vaccine recomb adjuvanted (AREXVY) 120 MCG/0.5ML injection Inject into the muscle. 12/16/22   Judyann Munson, MD  sertraline (ZOLOFT) 100 MG tablet TAKE 1 TABLET EVERY DAY 02/24/23    Simmons-Robinson, Tawanna Cooler, MD  Sodium Fluoride 1.1 % PSTE Use 1 Application Nightly after brushing/flossing. Spit out excess. No rinse/eat/drink for at least 30 minutes after application 12/30/22     tiZANidine (ZANAFLEX) 2 MG tablet TAKE 1 TO 2 TABLETS BY MOUTH AT BEDTIME AS NEEDED FOR MUSCLE SPASMS 08/17/22   Carney Living, MD  Hoy Register 720-20 ELU-MCG/ML injection  10/11/22   [provider]    Physical Exam: Vitals:   04/10/23 1830 04/10/23 1900 04/10/23 2130 04/10/23 2200  BP: (!) 146/67 (!) 149/66 (!) 168/70 (!) 166/69  Pulse: 76 64 74 75  Resp: (!) 26 15 (!) 21 14  Temp:      TempSrc:      SpO2: 98% 100% 99% 100%   General: Not in acute distress HEENT:       Eyes: PERRL, EOMI, no jaundice       ENT: No discharge from the ears and nose, no pharynx injection, no tonsillar enlargement.        Neck: No JVD, no bruit, no mass felt. Heme: No neck lymph node enlargement. Cardiac: S1/S2, RRR, No murmurs, No gallops or rubs. Respiratory: No rales, wheezing, rhonchi or rubs. GI: Soft, nondistended, nontender, no rebound pain, no organomegaly, BS present. GU: No hematuria Ext: No pitting leg edema bilaterally. 1+DP/PT pulse bilaterally. Musculoskeletal: No joint deformities, No joint redness or warmth, no limitation of ROM in spin. Skin: No rashes.  Neuro: Alert, oriented X3, cranial nerves II-XII grossly intact, moves all extremities normally.  Psych: Patient is not psychotic, no suicidal or hemocidal ideation.  Labs on Admission: I have personally reviewed following labs and imaging studies  CBC: Recent Labs  Lab 04/10/23 1820  WBC 13.2*  NEUTROABS 10.2*  HGB 13.2  HCT 40.7  MCV 95.1  PLT 262   Basic Metabolic Panel: Recent Labs  Lab 04/10/23 1820 04/10/23 1955  NA 142  --   K 3.4*  --   CL 107  --   CO2 24  --   GLUCOSE 53*  --   BUN 22  --   CREATININE 0.77  --  CALCIUM 9.2  --   PHOS  --  3.5   GFR: Estimated Creatinine Clearance: 70.8  mL/min (by C-G formula based on SCr of 0.77 mg/dL). Liver Function Tests: Recent Labs  Lab 04/10/23 1820  AST 35  ALT 48*  ALKPHOS 68  BILITOT 0.4  PROT 6.8  ALBUMIN 3.9   No results for input(s): "LIPASE", "AMYLASE" in the last 168 hours. No results for input(s): "AMMONIA" in the last 168 hours. Coagulation Profile: No results for input(s): "INR", "PROTIME" in the last 168 hours. Cardiac Enzymes: No results for input(s): "CKTOTAL", "CKMB", "CKMBINDEX", "TROPONINI" in the last 168 hours. BNP (last 3 results) No results for input(s): "PROBNP" in the last 8760 hours. HbA1C: No results for input(s): "HGBA1C" in the last 72 hours. CBG: Recent Labs  Lab 04/10/23 1942 04/10/23 2023 04/10/23 2030 04/10/23 2234 04/11/23 0015  GLUCAP 115* 85 73 238* 377*   Lipid Profile: No results for input(s): "CHOL", "HDL", "LDLCALC", "TRIG", "CHOLHDL", "LDLDIRECT" in the last 72 hours. Thyroid Function Tests: No results for input(s): "TSH", "T4TOTAL", "FREET4", "T3FREE", "THYROIDAB" in the last 72 hours. Anemia Panel: No results for input(s): "VITAMINB12", "FOLATE", "FERRITIN", "TIBC", "IRON", "RETICCTPCT" in the last 72 hours. Urine analysis:    Component Value Date/Time   COLORURINE YELLOW (A) 04/10/2023 2127   APPEARANCEUR CLEAR (A) 04/10/2023 2127   LABSPEC >1.046 (H) 04/10/2023 2127   PHURINE 5.0 04/10/2023 2127   GLUCOSEU >=500 (A) 04/10/2023 2127   HGBUR NEGATIVE 04/10/2023 2127   BILIRUBINUR NEGATIVE 04/10/2023 2127   KETONESUR NEGATIVE 04/10/2023 2127   PROTEINUR NEGATIVE 04/10/2023 2127   NITRITE NEGATIVE 04/10/2023 2127   LEUKOCYTESUR NEGATIVE 04/10/2023 2127   Sepsis Labs: @LABRCNTIP (procalcitonin:4,lacticidven:4) )No results found for this or any previous visit (from the past 240 hours).   Radiological Exams on Admission:   Assessment/Plan Principal Problem:   Hypoglycemia Active Problems:   Type 1 diabetes mellitus with diabetic polyneuropathy (HCC)    Hypokalemia   Dyslipidemia   HTN (hypertension)   Asthma   Leucocytosis   Adjustment disorder with mixed anxiety and depressed mood   Assessment and Plan:   Hypoglycemia: Blood sugar down to 31, improving with D10 drip.   -Placed PCU for observation -Check CBG every 2 hours -D50 -D10 drip 100 cc/h --> down to 50 cc/h -Consult diabetic educator  Type 1 diabetes mellitus with diabetic polyneuropathy (HCC): Recent A1c 8.2 -Discontinue insulin pump now  Hypokalemia: Potassium 3.4 -Repleted potassium -Check magnesium level  Dyslipidemia -Lipitor  HTN (hypertension) -IV hydralazine as needed -Patient not taking medications currently  Asthma: Stable -As needed albuterol and Mucinex  Leucocytosis: WBC 13.2, no fever, no source of infection identified.  Negative urinalysis, negative chest x-ray.  Likely reactive. -Follow-up with CBC  Adjustment disorder with mixed anxiety and depressed mood -Zoloft     DVT ppx: SQ Lovenox  Code Status: Full code   Family Communication:     not done, no family member is at bed side.     Disposition Plan:  Anticipate discharge back to previous environment  Consults called:  none  Admission status and Level of care: Progressive:    for obs    Dispo: The patient is from: Home              Anticipated d/c is to: Home              Anticipated d/c date is: 1 day              Patient  currently is not medically stable to d/c.    Severity of Illness:  The appropriate patient status for this patient is OBSERVATION. Observation status is judged to be reasonable and necessary in order to provide the required intensity of service to ensure the patient's safety. The patient's presenting symptoms, physical exam findings, and initial radiographic and laboratory data in the context of their medical condition is felt to place them at decreased risk for further clinical deterioration. Furthermore, it is anticipated that the patient will be  medically stable for discharge from the hospital within 2 midnights of admission.        Date of Service 04/11/2023    Lorretta Harp [facility]   If 7PM-7AM, please contact night-coverage [email or web address] 04/11/2023, 2:12 AM

## 2023-04-10 NOTE — ED Notes (Signed)
Pt A&O x4 with this RN at bedside. Pt family at bedside.

## 2023-04-10 NOTE — ED Notes (Signed)
Pt IV infiltrated during CT contrast. IV removed. Delay in CT scan sue to multiple attempts at another IV.

## 2023-04-10 NOTE — ED Notes (Signed)
Called charge at this time for assistance with getting  IV.

## 2023-04-10 NOTE — ED Triage Notes (Signed)
Pt in via [facility] for hypoglycemia. Pt BS was 31 on scene. Pt lethargic and unresponsive at bedside. Last known well was at 11am this morning.

## 2023-04-10 NOTE — ED Notes (Addendum)
ED floor stock out of 10% dextrose infusion. Pharm called at this time and are tubing it up.

## 2023-04-10 NOTE — ED Provider Notes (Signed)
[facility] Provider Note    Event Date/Time   First MD Initiated Contact with Patient 04/10/23 1815     (approximate)   History   Unresponsive and Hypoglycemia   HPI  Joyce Brown is a 73 y.o. female  here with AMS. Per EMS, LKN was around 11 o clock today. Pt was talking to family around 3:30 when she became increasingly drowsy, altered. EMS called, on their arrival pt had CBG of 31. Since then, she has been drowsy, given glucagon. Remainder of history limited 2/2 confusion.  Level 5 caveat invoked as remainder of history, ROS, and physical exam limited due to patient's confusion.        Physical Exam   Triage Vital Signs: ED Triage Vitals [04/10/23 1830]  Encounter Vitals Group     BP (!) 146/67     Systolic BP Percentile      Diastolic BP Percentile      Pulse Rate 76     Resp (!) 26     Temp      Temp src      SpO2 98 %     Weight      Height      Head Circumference      Peak Flow      Pain Score      Pain Loc      Pain Education      Exclude from Growth Chart     Most recent vital signs: Vitals:   04/10/23 2130 04/10/23 2200  BP: (!) 168/70 (!) 166/69  Pulse: 74 75  Resp: (!) 21 14  Temp:    SpO2: 99% 100%     General: Drowsy, minimally responsive. CV:  Good peripheral perfusion. Regular rate. Resp:  Normal work of breathing. Lungs clear. Abd:  No distention. No tenderness. Other:  Pupils dilated but reactive b/l, face symmetric. Gag intact. Strength 5/5 with retraction from painful stimuli bl UE and LE.   ED Results / Procedures / Treatments   Labs (all labs ordered are listed, but only abnormal results are displayed) Labs Reviewed  CBC WITH DIFFERENTIAL/PLATELET - Abnormal; Notable for the following components:      Result Value   WBC 13.2 (*)    Neutro Abs 10.2 (*)    Monocytes Absolute 1.1 (*)    All other components within normal limits  COMPREHENSIVE METABOLIC PANEL - Abnormal; Notable for the  following components:   Potassium 3.4 (*)    Glucose, Bld 53 (*)    ALT 48 (*)    All other components within normal limits  URINALYSIS, ROUTINE W REFLEX MICROSCOPIC - Abnormal; Notable for the following components:   Color, Urine YELLOW (*)    APPearance CLEAR (*)    Specific Gravity, Urine >1.046 (*)    Glucose, UA >=500 (*)    All other components within normal limits  CBG MONITORING, ED - Abnormal; Notable for the following components:   Glucose-Capillary 59 (*)    All other components within normal limits  CBG MONITORING, ED - Abnormal; Notable for the following components:   Glucose-Capillary 141 (*)    All other components within normal limits  CBG MONITORING, ED - Abnormal; Notable for the following components:   Glucose-Capillary <10 (*)    All other components within normal limits  CBG MONITORING, ED - Abnormal; Notable for the following components:   Glucose-Capillary 115 (*)    All other components within normal limits  CBG MONITORING,  ED - Abnormal; Notable for the following components:   Glucose-Capillary 238 (*)    All other components within normal limits  CBG MONITORING, ED - Abnormal; Notable for the following components:   Glucose-Capillary 377 (*)    All other components within normal limits  LACTIC ACID, PLASMA  PHOSPHORUS  MAGNESIUM  HIV ANTIBODY (ROUTINE TESTING W REFLEX)  BASIC METABOLIC PANEL  CBC  CBG MONITORING, ED  CBG MONITORING, ED  TROPONIN I (HIGH SENSITIVITY)     EKG Normal sinus rhythm, VR 68. PR 182, QRS 91, QTc 446. No acute St elevations or depressions. No ischemia or infarct. Subtle ST changes in inferior leads.   RADIOLOGY CXR: Clear CT angio head: Negative for LVO   I also independently reviewed and agree with radiologist interpretations.   PROCEDURES:  Critical Care performed: Yes, see critical care procedure note(s)  .Critical Care  Performed by: Shaune Pollack, MD Authorized by: Shaune Pollack, MD   Critical  care provider statement:    Critical care time (minutes):  30   Critical care time was exclusive of:  Separately billable procedures and treating other patients   Critical care was necessary to treat or prevent imminent or life-threatening deterioration of the following conditions:  Cardiac failure, circulatory failure, respiratory failure and metabolic crisis   Critical care was time spent personally by me on the following activities:  Development of treatment plan with patient or surrogate, discussions with consultants, evaluation of patient's response to treatment, examination of patient, ordering and review of laboratory studies, ordering and review of radiographic studies, ordering and performing treatments and interventions, pulse oximetry, re-evaluation of patient's condition and review of old charts     MEDICATIONS ORDERED IN ED: Medications  dextrose 10 % infusion ( Intravenous Rate/Dose Change 04/11/23 0029)  acetaminophen (TYLENOL) tablet 650 mg (650 mg Oral Given 04/10/23 2331)  ondansetron (ZOFRAN) injection 4 mg (has no administration in time range)  hydrALAZINE (APRESOLINE) injection 5 mg (has no administration in time range)  albuterol (PROVENTIL) (2.5 MG/3ML) 0.083% nebulizer solution 2.5 mg (has no administration in time range)  dextromethorphan-guaiFENesin (MUCINEX DM) 30-600 MG per 12 hr tablet 1 tablet (has no administration in time range)  dextrose 50 % solution 50 mL (has no administration in time range)  enoxaparin (LOVENOX) injection 40 mg (has no administration in time range)  dextrose 50 % solution 50 mL (50 mLs Intravenous Given 04/10/23 1820)  iohexol (OMNIPAQUE) 350 MG/ML injection 75 mL (75 mLs Intravenous Contrast Given 04/10/23 1842)  dextrose 50 % solution 50 mL (50 mLs Intravenous Given 04/10/23 1932)  dextrose 50 % solution 25 mL (25 mLs Intravenous Given 04/10/23 2036)  potassium chloride SA (KLOR-CON M) CR tablet 40 mEq (40 mEq Oral Given 04/10/23 2331)      IMPRESSION / MDM / ASSESSMENT AND PLAN / ED COURSE  I reviewed the triage vital signs and the nursing notes.                              Differential diagnosis includes, but is not limited to, hypoglycemia, seizure, CVA, arrhythmia, ischemia, occult sepsis  Patient's presentation is most consistent with acute presentation with potential threat to life or bodily function.  The patient is on the cardiac monitor to evaluate for evidence of arrhythmia and/or significant heart rate changes  73 yo F with h/o DM, GERD, HTn, HLD, PE, here with AMS. Pt noted to have profound, recurrent hypoglycemia, which I  suspect could be related to her insulin pump. She has been having a hard time acquiring the sensors for her pump, and she arrives with a beeping pump. This was removed, and pt required multiple doses of IV D50 and D10 at 150 cc/hr. Given initial unclear history, she was also sent for neuro imaging which showed no CVA or abnormality. Labs overall reassuring. EKG nonischemic. Will admit to medicine for obs.    FINAL CLINICAL IMPRESSION(S) / ED DIAGNOSES   Final diagnoses:  Hypoglycemia  Acute encephalopathy     Rx / DC Orders   ED Discharge Orders     None        Note:  This document was prepared using Dragon voice recognition software and may include unintentional dictation errors.   Shaune Pollack, MD 04/11/23 [phone]

## 2023-04-10 NOTE — ED Triage Notes (Incomplete)
Pt to ED via [facility] from home. Per EMS they were called out by family. Pt was found unresponsive by family. Last known well 1100. Pt's CBG was 31 on EMS arrival. Pt responsive to painful stimuli. Pt given Glucog

## 2023-04-11 ENCOUNTER — Other Ambulatory Visit: Payer: Medicare Other

## 2023-04-11 DIAGNOSIS — D72829 Elevated white blood cell count, unspecified: Secondary | ICD-10-CM | POA: Diagnosis present

## 2023-04-11 DIAGNOSIS — E1042 Type 1 diabetes mellitus with diabetic polyneuropathy: Secondary | ICD-10-CM | POA: Diagnosis not present

## 2023-04-11 DIAGNOSIS — G4733 Obstructive sleep apnea (adult) (pediatric): Secondary | ICD-10-CM | POA: Insufficient documentation

## 2023-04-11 DIAGNOSIS — G9340 Encephalopathy, unspecified: Secondary | ICD-10-CM | POA: Diagnosis not present

## 2023-04-11 DIAGNOSIS — E108 Type 1 diabetes mellitus with unspecified complications: Secondary | ICD-10-CM | POA: Insufficient documentation

## 2023-04-11 DIAGNOSIS — J45909 Unspecified asthma, uncomplicated: Secondary | ICD-10-CM | POA: Diagnosis present

## 2023-04-11 DIAGNOSIS — I10 Essential (primary) hypertension: Secondary | ICD-10-CM | POA: Diagnosis not present

## 2023-04-11 DIAGNOSIS — E876 Hypokalemia: Secondary | ICD-10-CM | POA: Diagnosis not present

## 2023-04-11 DIAGNOSIS — E162 Hypoglycemia, unspecified: Secondary | ICD-10-CM | POA: Diagnosis not present

## 2023-04-11 DIAGNOSIS — E785 Hyperlipidemia, unspecified: Secondary | ICD-10-CM | POA: Diagnosis not present

## 2023-04-11 LAB — CBG MONITORING, ED
Glucose-Capillary: 160 mg/dL — ABNORMAL HIGH (ref 70–99)
Glucose-Capillary: 166 mg/dL — ABNORMAL HIGH (ref 70–99)
Glucose-Capillary: 217 mg/dL — ABNORMAL HIGH (ref 70–99)
Glucose-Capillary: 228 mg/dL — ABNORMAL HIGH (ref 70–99)
Glucose-Capillary: 275 mg/dL — ABNORMAL HIGH (ref 70–99)
Glucose-Capillary: 282 mg/dL — ABNORMAL HIGH (ref 70–99)
Glucose-Capillary: 377 mg/dL — ABNORMAL HIGH (ref 70–99)
Glucose-Capillary: 377 mg/dL — ABNORMAL HIGH (ref 70–99)
Glucose-Capillary: 388 mg/dL — ABNORMAL HIGH (ref 70–99)
Glucose-Capillary: 403 mg/dL — ABNORMAL HIGH (ref 70–99)
Glucose-Capillary: 486 mg/dL — ABNORMAL HIGH (ref 70–99)

## 2023-04-11 LAB — CBC
HCT: 34.2 % — ABNORMAL LOW (ref 36.0–46.0)
Hemoglobin: 11.2 g/dL — ABNORMAL LOW (ref 12.0–15.0)
MCH: 30.9 pg (ref 26.0–34.0)
MCHC: 32.7 g/dL (ref 30.0–36.0)
MCV: 94.5 fL (ref 80.0–100.0)
Platelets: 203 10*3/uL (ref 150–400)
RBC: 3.62 MIL/uL — ABNORMAL LOW (ref 3.87–5.11)
RDW: 13.3 % (ref 11.5–15.5)
WBC: 8.2 10*3/uL (ref 4.0–10.5)
nRBC: 0 % (ref 0.0–0.2)

## 2023-04-11 LAB — BASIC METABOLIC PANEL
Anion gap: 10 (ref 5–15)
Anion gap: 11 (ref 5–15)
Anion gap: 9 (ref 5–15)
BUN: 21 mg/dL (ref 8–23)
BUN: 20 mg/dL (ref 8–23)
BUN: 19 mg/dL (ref 8–23)
CO2: 19 mmol/L — ABNORMAL LOW (ref 22–32)
CO2: 19 mmol/L — ABNORMAL LOW (ref 22–32)
CO2: 23 mmol/L (ref 22–32)
Calcium: 8.6 mg/dL — ABNORMAL LOW (ref 8.9–10.3)
Calcium: 8.5 mg/dL — ABNORMAL LOW (ref 8.9–10.3)
Calcium: 8.9 mg/dL (ref 8.9–10.3)
Chloride: 102 mmol/L (ref 98–111)
Chloride: 101 mmol/L (ref 98–111)
Chloride: 105 mmol/L (ref 98–111)
Creatinine, Ser: 0.92 mg/dL (ref 0.44–1.00)
Creatinine, Ser: 0.96 mg/dL (ref 0.44–1.00)
Creatinine, Ser: 0.93 mg/dL (ref 0.44–1.00)
GFR, Estimated: >60 mL/min (ref >60)
GFR, Estimated: >60 mL/min (ref >60)
GFR, Estimated: >60 mL/min (ref >60)
Glucose, Bld: 417 mg/dL — ABNORMAL HIGH (ref 70–99)
Glucose, Bld: 513 mg/dL (ref 70–99)
Glucose, Bld: 237 mg/dL — ABNORMAL HIGH (ref 70–99)
Potassium: 4.9 mmol/L (ref 3.5–5.1)
Potassium: 4.9 mmol/L (ref 3.5–5.1)
Potassium: 4.3 mmol/L (ref 3.5–5.1)
Sodium: 131 mmol/L — ABNORMAL LOW (ref 135–145)
Sodium: 131 mmol/L — ABNORMAL LOW (ref 135–145)
Sodium: 137 mmol/L (ref 135–145)

## 2023-04-11 LAB — BETA-HYDROXYBUTYRIC ACID
Beta-Hydroxybutyric Acid: 3.73 mmol/L — ABNORMAL HIGH (ref 0.05–0.27)
Beta-Hydroxybutyric Acid: 0.33 mmol/L — ABNORMAL HIGH (ref 0.05–0.27)

## 2023-04-11 LAB — HIV ANTIBODY (ROUTINE TESTING W REFLEX): HIV Screen 4th Generation wRfx: NONREACTIVE

## 2023-04-11 LAB — MAGNESIUM: Magnesium: 1.8 mg/dL (ref 1.7–2.4)

## 2023-04-11 LAB — HEMOGLOBIN A1C
Hgb A1c MFr Bld: 9.1 % — ABNORMAL HIGH (ref 4.8–5.6)
Mean Plasma Glucose: 214.47 mg/dL

## 2023-04-11 MED ORDER — ATORVASTATIN CALCIUM 20 MG PO TABS
80.0000 mg | ORAL_TABLET | Freq: Every day | ORAL | Status: DC
  Administered 2023-04-11 – 2023-04-12 (×2): 80 mg via ORAL
  Filled 2023-04-11 (×2): qty 4

## 2023-04-11 MED ORDER — TIZANIDINE HCL 2 MG PO TABS: 2.0000 mg | ORAL_TABLET | Freq: Three times a day (TID) | ORAL | Status: DC | PRN

## 2023-04-11 MED ORDER — ADULT MULTIVITAMIN W/MINERALS CH
2.0000 | ORAL_TABLET | Freq: Every day | ORAL | Status: DC
  Administered 2023-04-11 – 2023-04-12 (×2): 2 via ORAL
  Filled 2023-04-11 (×2): qty 2

## 2023-04-11 MED ORDER — LACTATED RINGERS IV BOLUS
1000.0000 mL | Freq: Once | INTRAVENOUS | Status: AC
  Administered 2023-04-11: 1000 mL via INTRAVENOUS

## 2023-04-11 MED ORDER — INSULIN ASPART 100 UNIT/ML IJ SOLN
10.0000 [IU] | Freq: Once | INTRAMUSCULAR | Status: AC
  Administered 2023-04-11: 10 [IU] via SUBCUTANEOUS
  Filled 2023-04-11: qty 1

## 2023-04-11 MED ORDER — POLYVINYL ALCOHOL 1.4 % OP SOLN: 1.0000 [drp] | Freq: Three times a day (TID) | OPHTHALMIC | Status: DC | PRN

## 2023-04-11 MED ORDER — BAQSIMI TWO PACK 3 MG/DOSE NA POWD: 3.0000 mg | NASAL | 2 refills | Status: AC | PRN

## 2023-04-11 MED ORDER — INSULIN ASPART 100 UNIT/ML IJ SOLN
3.0000 [IU] | Freq: Three times a day (TID) | INTRAMUSCULAR | Status: DC
  Administered 2023-04-11 – 2023-04-12 (×2): 3 [IU] via SUBCUTANEOUS
  Filled 2023-04-11 (×2): qty 1

## 2023-04-11 MED ORDER — INSULIN ASPART 100 UNIT/ML IJ SOLN
5.0000 [IU] | Freq: Once | INTRAMUSCULAR | Status: AC
  Administered 2023-04-11: 5 [IU] via SUBCUTANEOUS
  Filled 2023-04-11: qty 1

## 2023-04-11 MED ORDER — ENOXAPARIN SODIUM 40 MG/0.4ML IJ SOSY
40.0000 mg | PREFILLED_SYRINGE | INTRAMUSCULAR | Status: DC
  Administered 2023-04-11: 40 mg via SUBCUTANEOUS
  Filled 2023-04-11: qty 0.4

## 2023-04-11 MED ORDER — GABAPENTIN 100 MG PO CAPS
200.0000 mg | ORAL_CAPSULE | Freq: Three times a day (TID) | ORAL | Status: DC
  Administered 2023-04-11 – 2023-04-12 (×3): 200 mg via ORAL
  Filled 2023-04-11 (×3): qty 2

## 2023-04-11 MED ORDER — INSULIN ASPART 100 UNIT/ML IJ SOLN
0.0000 [IU] | Freq: Three times a day (TID) | INTRAMUSCULAR | Status: DC
  Administered 2023-04-11 – 2023-04-12 (×2): 5 [IU] via SUBCUTANEOUS
  Filled 2023-04-11 (×2): qty 1

## 2023-04-11 MED ORDER — SERTRALINE HCL 50 MG PO TABS
100.0000 mg | ORAL_TABLET | Freq: Every day | ORAL | Status: DC
  Administered 2023-04-11 – 2023-04-12 (×2): 100 mg via ORAL
  Filled 2023-04-11 (×2): qty 2

## 2023-04-11 MED ORDER — INSULIN GLARGINE-YFGN 100 UNIT/ML ~~LOC~~ SOLN
8.0000 [IU] | Freq: Two times a day (BID) | SUBCUTANEOUS | Status: DC
  Administered 2023-04-11 – 2023-04-12 (×3): 8 [IU] via SUBCUTANEOUS
  Filled 2023-04-11 (×4): qty 0.08

## 2023-04-11 MED ORDER — INSULIN ASPART 100 UNIT/ML IJ SOLN: 0.0000 [IU] | Freq: Every day | INTRAMUSCULAR | Status: DC

## 2023-04-11 NOTE — ED Notes (Addendum)
Spoke with pts sister Claria Dice [phone]) regarding discharge and pts general well-being. Judi states pt is fiercely independent and always finds a way to make things work. Updated Judi on pts poor balance and Judi states pt has had poor balance and due to her "pack-rat" tendencies her apartment is a safety risk. Judi in agreement that pt needs more help at home and would benefit from staying here for few days to find placement or find resources.

## 2023-04-11 NOTE — ED Notes (Signed)
Dr. Clyde Lundborg informed the patient's CBG is now 53. He gave order to decrease the infusion rate of the D10 down to 50 ml/hr.

## 2023-04-11 NOTE — Hospital Course (Addendum)
Taken from H&P.  Joyce Brown is a 73 y.o. female with medical history significant of type 1 diabetes using insulin pump, hypertension, hyperlipidemia, adjustment disorder, cerebral ataxia, spinal stenosis with chronic back pain, who presents with hypoglycemia.   Per report, patient was last known normal at about 11:00 today. Pt was talking to family around 3:30 when she became increasingly drowsy and altered. Per EMS, pt was found to have hypoglycemia with blood sugar 31.  Patient was given glucagon by EMS.  He received totally 75 cc of D50 in ED, still has persistent hypoglycemia.  D10 was started in ED, with improvement of blood sugar.Patient reported that she ran out of insulin pump sensor since January 1, she has been manually adjusting the insulin pump.   On presentation she was found to have leukocytosis at 13.2, mildly elevated blood pressure.  Chest x-ray negative, CTA of the head and neck negative for LVO.  Her mentation improved with improvement of hypoglycemia.  1/27: Mildly elevated blood pressure, otherwise hemodynamically stable.  CBG started rising, D10 was stopped at 3 AM.  Starting on basal and short-acting. Leukocytosis resolved. Her initial BMP shows none anion gap metabolic acidosis, beta-hydroxybutyrate acid  was also came back elevated, she was given 2 L of bolus along with some subcu insulin which resulted in slowly improving beta-hydroxybutyrate acid, metabolic acidosis resolved and her CBG improved with last check of 160.  Our diabetes coordinator saw her and they advise to start back her insulin pump from tomorrow as she received a dose of Semglee today.  She was also given Dexcom 7 sensors as she ran out of them and having some difficulties getting the supply, because of that she was unable to use automatic pump and was using it manually and having difficulty managing her blood glucose level.  When those sensors were functioning her blood glucose level was  maintained. She was instructed to manually manage her blood glucose level today with NovoLog and restart her insulin pump from tomorrow.  She also need to contact her endocrinologist for further assistance to overcome this problem which she is experiencing with her sensors. She was also given a prescription for intranasal glucagon.  Patient will continue the rest of her home medications and need to have a close follow-up with her providers.

## 2023-04-11 NOTE — ED Notes (Addendum)
Fall Note: Writer made aware by EVS staff who heard pt fall onto the floor and then seen her. Pt reported she was attempting to ambulate from recliner down hallway to see if she could. Pt witnessed on bilateral knee and holding onto recliner with bilateral hands, then turning to sit onto buttocks. Pt assisted by Clinical research associate and Norina Buzzard, RN to stand and sit onto recliner. Pt denies hitting head and denies LOC. Post fall staff to pt within 1 minute of fall. On assessment, pt with 3/10 pain to bilateral knees where bruising and redness noted. Pt able to move al extremities without difficulty and able to answer all questions appropriately. Full set of VS taken and post fall contact made to covering NP, Jawo.   At time of fall, pt in front of nursing station in hallway, fastened leather shoes in place. No chair alarm or fall band in place but applied post fall. Pts contact, sister, made aware of fall and current status.   Pt assisted with 2 staff from recliner to hospital bed. Once pt standing, pt nose dived herself forward into bed and stated, "Oh my, my balance is terrible." Pt able to follow commands but noted to be extremely impulsive and has poor safety judgment. Covering NP, Jawo made aware of and Clinical research associate request for PT/OT and social work prior to discharge due to pt living alone, non-motorized wheelchair at baseline and only family a sister who is unable to assist with pts home care needs.

## 2023-04-11 NOTE — Discharge Summary (Signed)
Physician Discharge Summary   Patient: Joyce Brown MRN: [id number] DOB: 1951-01-07  Admit date:     04/10/2023  Discharge date: 04/11/23  Discharge Physician: Arnetha Courser   PCP: Ronnald Ramp, MD   Recommendations at discharge:  Please obtain CBC and BMP on follow-up Follow-up with primary care provider within next few days  Discharge Diagnoses: Principal Problem:   Hypoglycemia Active Problems:   Type 1 diabetes mellitus with diabetic polyneuropathy (HCC)   Hypokalemia   Dyslipidemia   HTN (hypertension)   Asthma   Leucocytosis   Adjustment disorder with mixed anxiety and depressed mood   Acute encephalopathy   Hospital Course: Taken from H&P.  Joyce Brown is a 73 y.o. female with medical history significant of type 1 diabetes using insulin pump, hypertension, hyperlipidemia, adjustment disorder, cerebral ataxia, spinal stenosis with chronic back pain, who presents with hypoglycemia.   Per report, patient was last known normal at about 11:00 today. Pt was talking to family around 3:30 when she became increasingly drowsy and altered. Per EMS, pt was found to have hypoglycemia with blood sugar 31.  Patient was given glucagon by EMS.  He received totally 75 cc of D50 in ED, still has persistent hypoglycemia.  D10 was started in ED, with improvement of blood sugar.Patient reported that she ran out of insulin pump sensor since January 1, she has been manually adjusting the insulin pump.   On presentation she was found to have leukocytosis at 13.2, mildly elevated blood pressure.  Chest x-ray negative, CTA of the head and neck negative for LVO.  Her mentation improved with improvement of hypoglycemia.  1/27: Mildly elevated blood pressure, otherwise hemodynamically stable.  CBG started rising, D10 was stopped at 3 AM.  Starting on basal and short-acting. Leukocytosis resolved. Her initial BMP shows none anion gap metabolic acidosis, beta-hydroxybutyrate  acid  was also came back elevated, she was given 2 L of bolus along with some subcu insulin which resulted in slowly improving beta-hydroxybutyrate acid, metabolic acidosis resolved and her CBG improved with last check of 160.  Our diabetes coordinator saw her and they advise to start back her insulin pump from tomorrow as she received a dose of Semglee today.  She was also given Dexcom 7 sensors as she ran out of them and having some difficulties getting the supply, because of that she was unable to use automatic pump and was using it manually and having difficulty managing her blood glucose level.  When those sensors were functioning her blood glucose level was maintained. She was instructed to manually manage her blood glucose level today with NovoLog and restart her insulin pump from tomorrow.  She also need to contact her endocrinologist for further assistance to overcome this problem which she is experiencing with her sensors. She was also given a prescription for intranasal glucagon.  Patient will continue the rest of her home medications and need to have a close follow-up with her providers.   Consultants: None Procedures performed: None Disposition: Home Diet recommendation:  Discharge Diet Orders (From admission, onward)     Start     Ordered   04/11/23 0000  Diet - low sodium heart healthy        04/11/23 1559           Cardiac and Carb modified diet DISCHARGE MEDICATION: Allergies as of 04/11/2023   No Known Allergies      Medication List     STOP taking these medications    Twinrix  720-20 ELU-MCG/ML injection Generic drug: Hepatitis A-Hep B Recomb Vac       TAKE these medications    Acetaminophen 500 MG capsule Take 500 mg by mouth every 6 (six) hours as needed for moderate pain.   antiseptic oral rinse Liqd 15 mLs by Mouth Rinse route in the morning, at noon, in the evening, and at bedtime.   Arexvy 120 MCG/0.5ML injection Generic drug: RSV vaccine  recomb adjuvanted Inject into the muscle.   atorvastatin 80 MG tablet Commonly known as: LIPITOR Take 1 tablet (80 mg total) by mouth daily.   Baqsimi Two Pack 3 MG/DOSE Powd Generic drug: Glucagon Place 3 mg into the nose as needed (For hypoglycemia).   BD Insulin Syringe U/F 1/2Unit 31G X 5/16" 0.3 ML Misc Generic drug: Insulin Syringe-Needle U-100 USE 3 TIMES A DAY   carboxymethylcellulose 0.5 % Soln Commonly known as: REFRESH PLUS Place 1 drop into both eyes 3 (three) times daily as needed.   Dexcom G7 Sensor Misc by Does not apply route.   docusate sodium 100 MG capsule Commonly known as: COLACE Take 100 mg by mouth daily as needed for mild constipation.   Droplet Pen Needles 31G X 5 MM Misc Generic drug: Insulin Pen Needle USE 4 TIMES DAILY AS DIRECTED BY YOUR DOCTOR   estradiol 0.1 MG/GM vaginal cream Commonly known as: ESTRACE Place 1 Applicatorful vaginally 2 (two) times a week.   gabapentin 100 MG capsule Commonly known as: NEURONTIN Take 2 capsules (200 mg total) by mouth 3 (three) times daily.   glucose blood test strip Use   Microlet Lancets Misc 3 (three) times daily.   Miebo 1.338 GM/ML Soln Generic drug: Perfluorohexyloctane Apply 1 drop to eye 4 (four) times daily.   Multi-Vitamin tablet Take 2 tablets by mouth daily.   MYRBETRIQ PO Take 1 tablet by mouth daily.   NovoLOG PenFill cartridge Generic drug: insulin aspart USE MAX OF DAILY DOSE OF 60 UNITS PER CORRECTION SCALE AS DIRECTED What changed: additional instructions   Omega 3 1200 MG Caps Take by mouth. One daily   Omnipod 5 DexG7G6 Pods Gen 5 Misc SMARTSIG:SUB-Q Every 3 Days   sertraline 100 MG tablet Commonly known as: ZOLOFT TAKE 1 TABLET EVERY DAY   Sodium Fluoride 5000 PPM 1.1 % Pste Generic drug: Sodium Fluoride Use 1 Application Nightly after brushing/flossing. Spit out excess. No rinse/eat/drink for at least 30 minutes after application   tiZANidine 2 MG  tablet Commonly known as: ZANAFLEX TAKE 1 TO 2 TABLETS BY MOUTH AT BEDTIME AS NEEDED FOR MUSCLE SPASMS        Follow-up Information     Simmons-Robinson, Makiera, MD. Schedule an appointment as soon as possible for a visit in 1 week(s).   Specialty: Family Medicine Contact information: [address] [place] [address] [phone]                Discharge Exam:  General.  Well-developed lady, in no acute distress. Pulmonary.  Lungs clear bilaterally, normal respiratory effort. CV.  Regular rate and rhythm, no JVD, rub or murmur. Abdomen.  Soft, nontender, nondistended, BS positive. CNS.  Alert and oriented .  No focal neurologic deficit. Extremities.  No edema, no cyanosis, pulses intact and symmetrical. Psychiatry.  Judgment and insight appears normal.   Condition at discharge: stable  The results of significant diagnostics from this hospitalization (including imaging, microbiology, ancillary and laboratory) are listed below for reference.   Imaging Studies: DG Chest Portable 1  View Result Date: 04/10/2023 CLINICAL DATA:  Weakness and confusion. EXAM: PORTABLE CHEST 1 VIEW COMPARISON:  AP and lateral chest 10/05/2020 FINDINGS: The cardiac size is normal. The mediastinum is stable with mild aortic tortuosity and atherosclerosis. The lungs are slightly emphysematous but clear apart from increased linear scarring or atelectasis in the lateral left base. Osteopenia. IMPRESSION: No evidence of acute chest disease. Emphysema. Increased linear scarring or atelectasis in the lateral left base. Electronically Signed   By: Almira Bar M.D.   On: 04/10/2023 20:13   CT ANGIO HEAD NECK W WO CM Result Date: 04/10/2023 CLINICAL DATA:  Stroke/TIA, determine embolic source EXAM: CT ANGIOGRAPHY HEAD AND NECK WITH AND WITHOUT CONTRAST TECHNIQUE: Multidetector CT imaging of the head and neck was performed using the standard protocol during bolus administration of  intravenous contrast. Multiplanar CT image reconstructions and MIPs were obtained to evaluate the vascular anatomy. Carotid stenosis measurements (when applicable) are obtained utilizing NASCET criteria, using the distal internal carotid diameter as the denominator. RADIATION DOSE REDUCTION: This exam was performed according to the departmental dose-optimization program which includes automated exposure control, adjustment of the mA and/or kV according to patient size and/or use of iterative reconstruction technique. CONTRAST:  75mL OMNIPAQUE IOHEXOL 350 MG/ML SOLN COMPARISON:  MRI head June 09, 2021 CT head 12/01/2017. FINDINGS: CT HEAD FINDINGS Brain: No evidence of acute large vascular territory infarction, hemorrhage, hydrocephalus, extra-axial collection or mass lesion/mass effect. Vascular: See below. Skull: No acute fracture. Sinuses/Orbits: Suspected prior endoscopic sinus surgery. Mostly clear sinuses. No acute orbital findings. Other: No mastoid effusions. Review of the MIP images confirms the above findings CTA NECK FINDINGS Aortic arch: Aortic atherosclerosis. Great vessel origins are patent without significant stenosis. Right carotid system: No evidence of dissection, stenosis (50% or greater), or occlusion. Left carotid system: No evidence of dissection, stenosis (50% or greater), or occlusion. Vertebral arteries: Left dominant. No evidence of dissection, stenosis (50% or greater), or occlusion. Skeleton: No acute abnormality on limited assessment. Severe degenerative disease at C6-C7 with disc height loss, endplate sclerosis and endplate spurring. Other neck: No acute abnormality on limited assessment. Upper chest: Visualized lung apices are clear. Biapical pleuroparenchymal scarring. Review of the MIP images confirms the above findings CTA HEAD FINDINGS Anterior circulation: Bilateral intracranial ICAs, MCAs, and ACAs are patent without proximal hemodynamically significant stenosis. No aneurysm  identified. Posterior circulation: Bilateral intradural vertebral arteries are patent with severe stenosis on the right. Basilar artery and bilateral posterior cerebral arteries are patent with moderate right and mild left P2 PCA stenosis. Venous sinuses: As permitted by contrast timing, patent. Review of the MIP images confirms the above findings IMPRESSION: 1. No evidence of acute intracranial abnormality. 2. No emergent in large vessel occlusion. 3. Severe right intradural vertebral artery stenosis. 4. Moderate right and mild left P2 PCA stenosis. 5.  Aortic Atherosclerosis (ICD10-I70.0). Electronically Signed   By: Feliberto Harts M.D.   On: 04/10/2023 19:53   DG PAIN CLINIC C-ARM 1-60 MIN NO REPORT Result Date: 04/04/2023 Fluoro was used, but no Radiologist interpretation will be provided. Please refer to "NOTES" tab for provider progress note.   Microbiology: Results for orders placed or performed in visit on 04/30/21  COVID-19, Flu A+B and RSV     Status: None   Collection Time: 04/30/21 10:14 AM   Specimen: Nasal Swab  Result Value Ref Range Status   SARS-CoV-2, NAA Not Detected Not Detected Final   Influenza A, NAA Not Detected Not Detected Final   Influenza  B, NAA Not Detected Not Detected Final   RSV, NAA Not Detected Not Detected Final   Test Information: Comment  Final    Comment: This nucleic acid amplification test was developed and its performance characteristics determined by [employer]. Nucleic acid amplification tests include RT-PCR and TMA. This test has not been FDA cleared or approved. This test has been authorized by FDA under an Emergency Use Authorization (EUA). This test is only authorized for the duration of time the declaration that circumstances exist justifying the authorization of the emergency use of in vitro diagnostic tests for detection of SARS-CoV-2 virus and/or diagnosis of COVID-19 infection under section 564(b)(1) of the Act, 21  U.S.C. 161WRU-0(A) (1), unless the authorization is terminated or revoked sooner. When diagnostic testing is negative, the possibility of a false negative result should be considered in the context of a patient's recent exposures and the presence of clinical signs and symptoms consistent with COVID-19. An individual without symptoms of COVID-19 and who is not shedding SARS-CoV-2 virus wo uld expect to have a negative (not detected) result in this assay.     Labs: CBC: Recent Labs  Lab 04/10/23 1820 04/11/23 0444  WBC 13.2* 8.2  NEUTROABS 10.2*  --   HGB 13.2 11.2*  HCT 40.7 34.2*  MCV 95.1 94.5  PLT 262 203   Basic Metabolic Panel: Recent Labs  Lab 04/10/23 1820 04/10/23 1955 04/11/23 0444 04/11/23 0837 04/11/23 1344  NA 142  --  131* 131* 137  K 3.4*  --  4.9 4.9 4.3  CL 107  --  102 101 105  CO2 24  --  19* 19* 23  GLUCOSE 53*  --  417* 513* 237*  BUN 22  --  21 20 19   CREATININE 0.77  --  0.92 0.96 0.93  CALCIUM 9.2  --  8.6* 8.5* 8.9  MG  --   --  1.8  --   --   PHOS  --  3.5  --   --   --    Liver Function Tests: Recent Labs  Lab 04/10/23 1820  AST 35  ALT 48*  ALKPHOS 68  BILITOT 0.4  PROT 6.8  ALBUMIN 3.9   CBG: Recent Labs  Lab 04/11/23 0855 04/11/23 1013 04/11/23 1108 04/11/23 1347 04/11/23 1534  GLUCAP 486* 403* 282* 217* 160*    Discharge time spent: greater than 30 minutes.  This record has been created using Conservation officer, historic buildings. Errors have been sought and corrected,but may not always be located. Such creation errors do not reflect on the standard of care.   Signed: Arnetha Courser, MD [facility] 04/11/2023

## 2023-04-11 NOTE — ED Notes (Signed)
Pts sister, Gerre Pebbles, called to come pick sister up. Judi said it is to unsafe for her to come pick her sister up.

## 2023-04-11 NOTE — ED Notes (Signed)
[facility]  CALLED  FOR  TRANSPORT  HOME

## 2023-04-11 NOTE — Inpatient Diabetes Management (Addendum)
Inpatient Diabetes Program Recommendations  AACE/ADA: New Consensus Statement on Inpatient Glycemic Control   Target Ranges:  Prepandial:   less than 140 mg/dL      Peak postprandial:   less than 180 mg/dL (1-2 hours)      Critically ill patients:  140 - 180 mg/dL    Latest Reference Range & Units 04/10/23 18:13 04/10/23 18:29 04/10/23 19:26 04/10/23 19:42 04/10/23 20:23 04/10/23 20:30 04/10/23 22:34 04/11/23 00:15 04/11/23 02:34 04/11/23 05:22  Glucose-Capillary 70 - 99 mg/dL 59 (L) 829 (H) <56 (LL) 115 (H) 85 73 238 (H) 377 (H) 377 (H) 388 (H)    Latest Reference Range & Units 04/10/23 18:20 04/11/23 04:44  CO2 22 - 32 mmol/L 24 19 (L)  Glucose 70 - 99 mg/dL 53 (L) 213 (H)  Anion gap 5 - 15  11 10     Review of Glycemic Control  Diabetes history: DM1 Outpatient Diabetes medications: OmniPod insulin pump with Novolog, Tresiba 18 units daily (when not on insulin pump), Dexcom G7 CGM  Current orders for Inpatient glycemic control: None; D10@ 50 ml/hr  Inpatient Diabetes Program Recommendations:    Insulin: Please order stat BMET to ensure patient is not in DKA since insulin pump removed last night and lab glucose 417 mg/dl and CO2 19 at 0:86 am today. If patient is in DKA, will need to order IV insulin per [facility]. If patient is NOT in DKA, please consider ordering Semglee 15 units Q24H, CBGs Q4H, and Novolog 0-9 units Q4H.  Outpatient DM: At time of discharge please provide Rx for Baqsimi (nasal glucagon; #[id number]).  NOTE: Patient with Type 1 DM who uses an insulin pump for DM management, presented with hypoglycemia. Per chart, patient's insulin pump was removed last night. Per note by Dr. Clyde Lundborg on 04/10/23, "Pt was talking to family around 3:30 when she became increasingly drowsy and altered. Per EMS, pt was found to have hypoglycemia with blood sugar 31.  Patient was given glucagon by EMS.  He received totally 75 cc of D50 in ED, still has persistent hypoglycemia.  D10 was started in ED,  with improvement of blood sugar.  When I saw patient in ED, her mental status has come back to normal.  She is alert oriented x 3.  She moves all extremities normally.  She denies any chest pain, cough, SOB.  No nausea, vomiting, diarrhea or abdominal pain.  No symptoms of UTI.  Patient reported that she ran out of insulin pump sensor since January 1, she has been manually adjusting the insulin pump." Labs at 4:44 am today with glucose of 417 mg/dl and CO2 of 19. Sent chat message to Dr. Nelson Chimes and RN asking for BMET to ensure patient is not in DKA, to discontinue D10. If in DKA, patient will require IV insulin; if NOT in DKA patient will need basal and correction insulin ordered.  Per Care Everywhere, patient was seen by Dr. Standley Brooking (Endocrinologist) on 03/30/23 and per office note patient having trouble getting Dexcom G7 sensors and patient was asked to increase basal rate to 0.7 units/hour.  Will follow up with patient today.   Addendum 04/11/23@11 :40-Spoke with patient at bedside regarding DM. Patient confirms that she uses OmniPod insulin pump for DM control. Patient states that she has been having an issue with getting Dexcom G7 sensors. Therefore, she has not been able to use auto mode on insulin pump. Patient states that her basal rate is 0.7 units/hr (total of 16.8 units over 24 hours). Patient reports  that she enters her carbs and does not have to do corrections very often. Patient reports she lives alone. Patient does not have her PDM device with her at the hospital and does not have any extra pump supplies here. Patient reports that she called [facility] last week and she was told that they were waiting for Endocrinology note to be faxed over. Patient reports that since she has been without her Dexcom G7 sensors, she has to use manual mode on her insulin pump and her glucose is not as well controlled. She notes that her most recent A1C was 9% which is why her basal rate was increased from 0.6 unit/hr to 0.7  units/hr. Patient states that she would like to resume her OmniPod insulin pump once she is discharged home. Patient reports that she has [place] at home and takes 18 units daily when not using her insulin pump.  Discussed that SQ insulin would be used while inpatient since insulin pump was removed and she does not have any pump supplies at the hospital. Informed patient that our team has sample Dexcom G7 sensors and I would provide her with 2 sensors so she can safely resume using the auto mode on OmniPod insulin pump when resumed after discharge. Encouraged patient to reach back out to [facility] about getting Dexcom G7 sensors and reach out to Endocrinologist if they are not already shipped out or if there are any further issues with getting sensors.  Discussed hypoglycemia along with treatment. Patient states she has an emergency kit for hypoglycemia (with oral glucose treatment) at home but no glucagon for severe hypoglycemia. Discussed Baqsimi and informed patient that it would be requested that she be prescribed Baqsimi at discharge. Patient appreciative of information discussed and has no questions at this time.   Addendum 04/11/23@14 :45-Spoke with patient again at bedside in ED. Provided Dexcom G7 sensor samples (2) and placed with patient's belongings at bedside. Explained to patient that once discharged, she would need to wait for 24 hours after Semglee last given before resuming insulin pump. Informed patient that Semglee 8 units given at 9:43 am today.  Explained that she would need to use Novolog SQ injection for carb coverage and correction at home until she is able to resume OmniPod insulin pump. Again reminded patient to call [facility] about Dexcom G7 sensor order and if not already shipped out to contact her Endocrinology office so they can assist with resolving the issue. Patient appreciative of sensor samples and the information discussed and has no questions at this time.   Thanks, Orlando Penner,  RN, MSN, CDCES Diabetes Coordinator Inpatient Diabetes Program [phone] (Team Pager from 8am to 5pm)

## 2023-04-12 DIAGNOSIS — E162 Hypoglycemia, unspecified: Secondary | ICD-10-CM | POA: Diagnosis not present

## 2023-04-12 LAB — CBG MONITORING, ED
Glucose-Capillary: 239 mg/dL — ABNORMAL HIGH (ref 70–99)
Glucose-Capillary: 263 mg/dL — ABNORMAL HIGH (ref 70–99)
Glucose-Capillary: 181 mg/dL — ABNORMAL HIGH (ref 70–99)

## 2023-04-12 NOTE — Care Management Obs Status (Signed)
MEDICARE OBSERVATION STATUS NOTIFICATION   Patient Details  Name: Rosela Supak MRN: [id number] Date of Birth: 1951/01/21   Medicare Observation Status Notification Given:  Yes    Sherilyn Banker 04/12/2023, 10:33 AM

## 2023-04-12 NOTE — Discharge Instructions (Signed)
[facility] offers a [employer] that anyone can call toll free at [phone]. The friendship line is available 24 hours a day  [facility] is a Program of All-inclusive Care for the Elderly (PACE). Their mission is to promote and sustain the independence of seniors wishing to remain in the community. They provide seniors with comprehensive long-term health, social, medical and dietary care. Their program is a safe alternative to nursing home care. [phone]  [facility] Physical Address [facility] [address] [place], [place] [address] Phone: [phone]. . Online zoom yoga class, connect with others without leaving your home [facility] offers [place] dance cardio sessions for individuals via Zoom. This program provides: - Dance fitness activities Please contact program for more information. Servinganyone in need adults 18+ hiv/aids individuals families Call [phone]  Email [email or web address] to get more info  [employer] offers an online [employer] to individuals where they can receive help to focus on their best health. Whether you're a [employer] member or not, the neighborhood center offers a... Main Serviceshealth education  exercise & fitness  community support services  recreation  virtual support Other Servicessupport groups Servinganyone in need adults young adults teens seniors individuals families [email or web address] to get more info  Schedule on their website  The [facility] offers an array of activities for adults age 27 and over. This program provides:- Fitness and health programs- Tech classes- Activity books Main Serviceshealth education  community support services  exercise & fitness  recreation  more education Servingseniors  Call [phone]    For more resources go online to [email or web address] and type in you zipcode

## 2023-04-12 NOTE — TOC Transition Note (Signed)
Transition of Care [facility]) - Discharge Note   Patient Details  Name: Joyce Brown MRN: [id number] Date of Birth: [birth year]  [facility] CM/SW Contact:  Margarito Liner, LCSW Phone Number: 04/12/2023, 11:57 AM   Clinical Narrative:   Patient has orders to discharge home today. CSW met with patient. Sister is at bedside. CSW introduced role and explained that discharge planning would be discussed. Patient and sister are agreeable to home health services. First preference is [facility] because brother-in-law had worked with them in the past. [facility] accepted referral for PT, OT, RN, aide. No further concerns. CSW signing off.  Final next level of care: Home w Home Health Services Barriers to Discharge: No Barriers Identified   Patient Goals and CMS Choice            Discharge Placement                Patient to be transferred to facility by: Sister Name of family member notified: Gerre Pebbles Patient and family notified of of transfer: 04/12/23  Discharge Plan and Services Additional resources added to the After Visit Summary for                            [facility] Arranged: RN, PT, OT, Nurse's Aide HH Agency: [facility] Date [facility] Agency Contacted: 04/12/23   Representative spoke with at [facility] Agency: Shanda Bumps  Social Drivers of Health (SDOH) Interventions SDOH Screenings   Food Insecurity: No Food Insecurity (03/25/2023)   Received from [facility]  Recent Concern: Food Insecurity - Food Insecurity Present (01/07/2023)  Housing: Low Risk  (03/25/2023)   Received from [facility]  Transportation Needs: No Transportation Needs (03/25/2023)   Received from [facility]  Utilities: Not At Risk (03/25/2023)   [facility] from [facility]  Alcohol Screen: Low Risk  (01/11/2023)  Depression (PHQ2-9): Low Risk  (02/09/2023)  Financial Resource Strain: Low Risk  (03/25/2023)   Received  from [facility]  Recent Concern: Financial Resource Strain - Medium Risk (01/07/2023)  Physical Activity: Insufficiently Active (01/11/2023)  Social Connections: Socially Isolated (01/11/2023)  Stress: No Stress Concern Present (01/11/2023)  Tobacco Use: Low Risk  (04/10/2023)  Health Literacy: Adequate Health Literacy (01/11/2023)     Readmission Risk Interventions     No data to display

## 2023-04-12 NOTE — ED Provider Notes (Signed)
-----------------------------------------   5:44 AM on 04/12/2023 -----------------------------------------   Blood pressure (!) 160/63, pulse 75, temperature 98 F (36.7 C), temperature source Oral, resp. rate 19, SpO2 98%.  The patient is calm and cooperative at this time.  There have been no acute events since the last update.  Awaiting disposition plan from Social Work team.   Irean Hong, MD 04/12/23 [phone]

## 2023-04-12 NOTE — ED Notes (Signed)
Pt A/Ox4 sister at bedside for discharge.  Pt has full accepted orders for home health. Vitals WNL

## 2023-04-12 NOTE — Telephone Encounter (Signed)
Appt scheduled

## 2023-04-15 ENCOUNTER — Encounter: Payer: Self-pay | Admitting: Student in an Organized Health Care Education/Training Program

## 2023-04-15 DIAGNOSIS — J45909 Unspecified asthma, uncomplicated: Secondary | ICD-10-CM | POA: Diagnosis not present

## 2023-04-15 DIAGNOSIS — F4323 Adjustment disorder with mixed anxiety and depressed mood: Secondary | ICD-10-CM | POA: Diagnosis not present

## 2023-04-15 DIAGNOSIS — E10649 Type 1 diabetes mellitus with hypoglycemia without coma: Secondary | ICD-10-CM | POA: Diagnosis not present

## 2023-04-15 DIAGNOSIS — Z90710 Acquired absence of both cervix and uterus: Secondary | ICD-10-CM | POA: Diagnosis not present

## 2023-04-15 DIAGNOSIS — Z9181 History of falling: Secondary | ICD-10-CM | POA: Diagnosis not present

## 2023-04-15 DIAGNOSIS — Z9089 Acquired absence of other organs: Secondary | ICD-10-CM | POA: Diagnosis not present

## 2023-04-15 DIAGNOSIS — M797 Fibromyalgia: Secondary | ICD-10-CM | POA: Diagnosis not present

## 2023-04-15 DIAGNOSIS — D72829 Elevated white blood cell count, unspecified: Secondary | ICD-10-CM | POA: Diagnosis not present

## 2023-04-15 DIAGNOSIS — G4730 Sleep apnea, unspecified: Secondary | ICD-10-CM | POA: Diagnosis not present

## 2023-04-15 DIAGNOSIS — Z86711 Personal history of pulmonary embolism: Secondary | ICD-10-CM | POA: Diagnosis not present

## 2023-04-15 DIAGNOSIS — I10 Essential (primary) hypertension: Secondary | ICD-10-CM | POA: Diagnosis not present

## 2023-04-15 DIAGNOSIS — G8929 Other chronic pain: Secondary | ICD-10-CM | POA: Diagnosis not present

## 2023-04-15 DIAGNOSIS — L89151 Pressure ulcer of sacral region, stage 1: Secondary | ICD-10-CM | POA: Diagnosis not present

## 2023-04-15 DIAGNOSIS — E876 Hypokalemia: Secondary | ICD-10-CM | POA: Diagnosis not present

## 2023-04-15 DIAGNOSIS — I700 Atherosclerosis of aorta: Secondary | ICD-10-CM | POA: Diagnosis not present

## 2023-04-15 DIAGNOSIS — E11649 Type 2 diabetes mellitus with hypoglycemia without coma: Secondary | ICD-10-CM | POA: Diagnosis not present

## 2023-04-15 DIAGNOSIS — E1042 Type 1 diabetes mellitus with diabetic polyneuropathy: Secondary | ICD-10-CM | POA: Diagnosis not present

## 2023-04-15 DIAGNOSIS — M4800 Spinal stenosis, site unspecified: Secondary | ICD-10-CM | POA: Diagnosis not present

## 2023-04-15 DIAGNOSIS — R4182 Altered mental status, unspecified: Secondary | ICD-10-CM | POA: Diagnosis not present

## 2023-04-15 DIAGNOSIS — H9193 Unspecified hearing loss, bilateral: Secondary | ICD-10-CM | POA: Diagnosis not present

## 2023-04-15 DIAGNOSIS — K219 Gastro-esophageal reflux disease without esophagitis: Secondary | ICD-10-CM | POA: Diagnosis not present

## 2023-04-15 DIAGNOSIS — M1990 Unspecified osteoarthritis, unspecified site: Secondary | ICD-10-CM | POA: Diagnosis not present

## 2023-04-15 DIAGNOSIS — E785 Hyperlipidemia, unspecified: Secondary | ICD-10-CM | POA: Diagnosis not present

## 2023-04-15 DIAGNOSIS — M545 Low back pain, unspecified: Secondary | ICD-10-CM | POA: Diagnosis not present

## 2023-04-16 DIAGNOSIS — E10649 Type 1 diabetes mellitus with hypoglycemia without coma: Secondary | ICD-10-CM | POA: Diagnosis not present

## 2023-04-16 DIAGNOSIS — I10 Essential (primary) hypertension: Secondary | ICD-10-CM | POA: Diagnosis not present

## 2023-04-16 DIAGNOSIS — L89151 Pressure ulcer of sacral region, stage 1: Secondary | ICD-10-CM | POA: Diagnosis not present

## 2023-04-16 DIAGNOSIS — E1042 Type 1 diabetes mellitus with diabetic polyneuropathy: Secondary | ICD-10-CM | POA: Diagnosis not present

## 2023-04-16 DIAGNOSIS — E876 Hypokalemia: Secondary | ICD-10-CM | POA: Diagnosis not present

## 2023-04-16 DIAGNOSIS — E785 Hyperlipidemia, unspecified: Secondary | ICD-10-CM | POA: Diagnosis not present

## 2023-04-18 ENCOUNTER — Ambulatory Visit
Payer: Medicare Other | Attending: Student in an Organized Health Care Education/Training Program | Admitting: Student in an Organized Health Care Education/Training Program

## 2023-04-18 ENCOUNTER — Telehealth: Payer: Self-pay | Admitting: Family Medicine

## 2023-04-18 DIAGNOSIS — M5412 Radiculopathy, cervical region: Secondary | ICD-10-CM | POA: Insufficient documentation

## 2023-04-18 DIAGNOSIS — M47816 Spondylosis without myelopathy or radiculopathy, lumbar region: Secondary | ICD-10-CM | POA: Insufficient documentation

## 2023-04-18 DIAGNOSIS — L89151 Pressure ulcer of sacral region, stage 1: Secondary | ICD-10-CM | POA: Diagnosis not present

## 2023-04-18 DIAGNOSIS — E876 Hypokalemia: Secondary | ICD-10-CM | POA: Diagnosis not present

## 2023-04-18 DIAGNOSIS — E785 Hyperlipidemia, unspecified: Secondary | ICD-10-CM | POA: Diagnosis not present

## 2023-04-18 DIAGNOSIS — M542 Cervicalgia: Secondary | ICD-10-CM | POA: Diagnosis not present

## 2023-04-18 DIAGNOSIS — E10649 Type 1 diabetes mellitus with hypoglycemia without coma: Secondary | ICD-10-CM | POA: Diagnosis not present

## 2023-04-18 DIAGNOSIS — I10 Essential (primary) hypertension: Secondary | ICD-10-CM | POA: Diagnosis not present

## 2023-04-18 DIAGNOSIS — E1042 Type 1 diabetes mellitus with diabetic polyneuropathy: Secondary | ICD-10-CM | POA: Diagnosis not present

## 2023-04-18 NOTE — Progress Notes (Signed)
Patient: Joyce Brown  Service Category: E/M  Provider: Edward Jolly, MD  DOB: [birth year]  DOS: 04/18/2023  Location: Office  MRN: [id number]  Setting: Ambulatory outpatient  Referring Provider: Brett Albino*  Type: Established Patient  Specialty: Interventional Pain Management  PCP: Ronnald Ramp, MD  Location: Remote location  Delivery: TeleHealth     Virtual Encounter - Pain Management PROVIDER NOTE: Information contained herein reflects review and annotations entered in association with encounter. Interpretation of such information and data should be left to medically-trained personnel. Information provided to patient can be located elsewhere in the medical record under "Patient Instructions". Document created using STT-dictation technology, any transcriptional errors that may result from process are unintentional.    Contact & Pharmacy Preferred: [phone] Home: [phone] (home) Mobile: [phone] (mobile) E-mail: [email or web address]  [facility] - [place], [place] - [address] [place] [address] Phone: [phone] Fax: [phone]   Pre-screening  Joyce Brown offered "in-person" vs "virtual" encounter. She indicated preferring virtual for this encounter.   Reason COVID-19*  Social distancing based on CDC and AMA recommendations.   I contacted Joyce Brown on 04/18/2023 via telephone.      I clearly identified myself as Edward Jolly, MD. I verified that I was speaking with the correct person using two identifiers (Name: Joyce Brown, and date of birth: 01/09/1951).  Consent I sought verbal advanced consent from Joyce Brown for virtual visit interactions. I informed Joyce Brown of possible security and privacy concerns, risks, and limitations associated with providing "not-in-person" medical evaluation and management services. I also informed Joyce Brown of the  availability of "in-person" appointments. Finally, I informed her that there would be a charge for the virtual visit and that she could be  personally, fully or partially, financially responsible for it. Joyce Brown expressed understanding and agreed to proceed.   Historic Elements   Joyce Brown is a 73 y.o. year old, female patient evaluated today after our last contact on 04/04/2023. Joyce Brown  has a past medical history of Arthritis (1995), Asthma (1995), Cataract (2017), Cerebellar ataxia (HCC), Depression (1996), Diabetes mellitus without complication (HCC), Diabetic neuropathy (HCC) (12/09/2020), FH: ovarian cancer in first degree relative (11/16/2018), Foot fracture (2022), Gait abnormality (11/07/2018), GERD (gastroesophageal reflux disease) (1985), Glaucoma (Various), Hyperlipidemia, Hypertension (1985), Neuromuscular disorder (HCC) (2006), Pulmonary embolism (HCC), Sleep apnea, and Spinal stenosis. She also  has a past surgical history that includes Gastric bypass (2014); Ankle surgery (Left); Bilateral carpal tunnel release; Adenoidectomy; Elbow surgery; Hand neuroplasty; Tonsillectomy; Abdominal hysterectomy; Breast biopsy (Left, 2019); radio denervation (05/21/2020); Small intestine surgery (08/18/2013); Eye surgery (03/14/22 & 03/17/22); Colon surgery (09/25/2012); Abdominal hysterectomy (1997); Nasal sinus surgery; Colonoscopy with propofol (N/A, 03/24/2023); and polypectomy (03/24/2023). Joyce Brown has a current medication list which includes the following prescription(s): acetaminophen, antiseptic oral rinse, atorvastatin, bd insulin syringe u/f 1/2unit, carboxymethylcellulose, dexcom g7 sensor, docusate sodium, estradiol, gabapentin, baqsimi two pack, glucose blood, novolog penfill, omnipod 5 dexg7g6 pods gen 5, droplet pen needles, microlet lancets, miebo, mirabegron, multi-vitamin, omega 3, arexvy, sertraline, sodium fluoride, and tizanidine. She  reports that she has never  smoked. She has never been exposed to tobacco smoke. She has never used smokeless tobacco. She reports that she does not drink alcohol and does not use drugs. Joyce Brown has no known allergies.  BMI: Estimated body mass index is 24.97 kg/m as calculated from the following:   Height as of 04/04/23: 5\' 10"  (1.778 m).  Weight as of 04/04/23: 174 lb (78.9 kg). Last encounter: 03/02/2023. Last procedure: 04/04/2023.  HPI  Today, she is being contacted for a post-procedure assessment.   Post-procedure evaluation    Procedure: Cervical Epidural Steroid injection (CESI) (Interlaminar) #1  Laterality: Right  Level: C7-T1 Imaging: Fluoroscopy-assisted DOS: 04/04/2023  Performed by: Edward Jolly, MD Anesthesia: Local anesthesia (1-2% Lidocaine)   Purpose: Diagnostic/Therapeutic Indications: Cervicalgia, cervical radicular pain, degenerative disc disease, severe enough to impact quality of life or function. Cervical Radicular Pain  NAS-11 score:   Pre-procedure: 5 /10   Post-procedure: 5  (5/100 neck and back while standing)/10     Effectiveness:  Initial hour after procedure: 100 %  Subsequent 4-6 hours post-procedure: 25 %  Analgesia past initial 6 hours: 75 %  Ongoing improvement:  Analgesic:  75% Function: Joyce Brown reports improvement in function ROM: Somewhat improved   Laboratory Chemistry Profile   Renal Lab Results  Component Value Date   BUN 19 04/11/2023   CREATININE 0.93 04/11/2023   BCR 32 (H) 06/09/2022   GFR 68.27 06/26/2021   GFRAA 80 11/20/2019   GFRNONAA >60 04/11/2023    Hepatic Lab Results  Component Value Date   AST 35 04/10/2023   ALT 48 (H) 04/10/2023   ALBUMIN 3.9 04/10/2023   ALKPHOS 68 04/10/2023   LIPASE 26 02/06/2019    Electrolytes Lab Results  Component Value Date   NA 137 04/11/2023   K 4.3 04/11/2023   CL 105 04/11/2023   CALCIUM 8.9 04/11/2023   MG 1.8 04/11/2023   PHOS 3.5 04/10/2023    Bone No results found for:  "VD25OH", "VD125OH2TOT", "WU9811BJ4", "NW2956OZ3", "25OHVITD1", "25OHVITD2", "25OHVITD3", "TESTOFREE", "TESTOSTERONE"  Inflammation (CRP: Acute Phase) (ESR: Chronic Phase) Lab Results  Component Value Date   LATICACIDVEN 1.6 04/10/2023         Note: Above Lab results reviewed.  Imaging  DG Chest Portable 1 View CLINICAL DATA:  Weakness and confusion.  EXAM: PORTABLE CHEST 1 VIEW  COMPARISON:  AP and lateral chest 10/05/2020  FINDINGS: The cardiac size is normal. The mediastinum is stable with mild aortic tortuosity and atherosclerosis.  The lungs are slightly emphysematous but clear apart from increased linear scarring or atelectasis in the lateral left base. Osteopenia.  IMPRESSION: No evidence of acute chest disease. Emphysema. Increased linear scarring or atelectasis in the lateral left base.  Electronically Signed   By: Almira Bar M.D.   On: 04/10/2023 20:13 CT ANGIO HEAD NECK W WO CM CLINICAL DATA:  Stroke/TIA, determine embolic source  EXAM: CT ANGIOGRAPHY HEAD AND NECK WITH AND WITHOUT CONTRAST  TECHNIQUE: Multidetector CT imaging of the head and neck was performed using the standard protocol during bolus administration of intravenous contrast. Multiplanar CT image reconstructions and MIPs were obtained to evaluate the vascular anatomy. Carotid stenosis measurements (when applicable) are obtained utilizing NASCET criteria, using the distal internal carotid diameter as the denominator.  RADIATION DOSE REDUCTION: This exam was performed according to the departmental dose-optimization program which includes automated exposure control, adjustment of the mA and/or kV according to patient size and/or use of iterative reconstruction technique.  CONTRAST:  75mL OMNIPAQUE IOHEXOL 350 MG/ML SOLN  COMPARISON:  MRI head June 09, 2021 CT head 12/01/2017.  FINDINGS: CT HEAD FINDINGS  Brain: No evidence of acute large vascular territory  infarction, hemorrhage, hydrocephalus, extra-axial collection or mass lesion/mass effect.  Vascular: See below.  Skull: No acute fracture.  Sinuses/Orbits: Suspected prior endoscopic sinus surgery. Mostly clear sinuses. No acute orbital  findings.  Other: No mastoid effusions.  Review of the MIP images confirms the above findings  CTA NECK FINDINGS  Aortic arch: Aortic atherosclerosis. Great vessel origins are patent without significant stenosis.  Right carotid system: No evidence of dissection, stenosis (50% or greater), or occlusion.  Left carotid system: No evidence of dissection, stenosis (50% or greater), or occlusion.  Vertebral arteries: Left dominant. No evidence of dissection, stenosis (50% or greater), or occlusion.  Skeleton: No acute abnormality on limited assessment. Severe degenerative disease at C6-C7 with disc height loss, endplate sclerosis and endplate spurring.  Other neck: No acute abnormality on limited assessment.  Upper chest: Visualized lung apices are clear. Biapical pleuroparenchymal scarring.  Review of the MIP images confirms the above findings  CTA HEAD FINDINGS  Anterior circulation: Bilateral intracranial ICAs, MCAs, and ACAs are patent without proximal hemodynamically significant stenosis. No aneurysm identified.  Posterior circulation: Bilateral intradural vertebral arteries are patent with severe stenosis on the right. Basilar artery and bilateral posterior cerebral arteries are patent with moderate right and mild left P2 PCA stenosis.  Venous sinuses: As permitted by contrast timing, patent.  Review of the MIP images confirms the above findings  IMPRESSION: 1. No evidence of acute intracranial abnormality. 2. No emergent in large vessel occlusion. 3. Severe right intradural vertebral artery stenosis. 4. Moderate right and mild left P2 PCA stenosis. 5.  Aortic Atherosclerosis (ICD10-I70.0).  Electronically Signed   By:  Feliberto Harts M.D.   On: 04/10/2023 19:53  Assessment  The primary encounter diagnosis was Cervical radicular pain. Diagnoses of Cervicalgia, Lumbar facet arthropathy, and Lumbar spondylosis were also pertinent to this visit.  Plan of Care  1.  Excellent response after cervical epidural steroid injection.  Continue to monitor symptoms and repeat as needed.  2.  States that low back pain is well-managed.  She is status post first set of diagnostic lumbar facet medial branch nerve blocks which were very helpful.  We discussed repeating set #2 if her axial low back pain increases.  She has an as needed order for a repeat cervical ESI as well as repeat lumbar medial branch nerve blocks as needed.  She is instructed to call the clinic and schedule those procedures as needed.    Follow-up plan:   Return if symptoms worsen or fail to improve, for patient will call to schedule F2F appt prn.      B/L CMC injection and Cervical TPI; B/L L3,4,5 MBNB 02/09/24, 04/04/23. C-ESI 04/04/23    Recent Visits Date Type Provider Dept  04/04/23 Procedure visit Edward Jolly, MD [facility]  03/02/23 Office Visit Edward Jolly, MD [facility]  02/09/23 Procedure visit Edward Jolly, MD [facility]  Showing recent visits within past 90 days and meeting all other requirements Today's Visits Date Type Provider Dept  04/18/23 Office Visit Edward Jolly, MD [facility]  Showing today's visits and meeting all other requirements Future Appointments No visits were found meeting these conditions. Showing future appointments within next 90 days and meeting all other requirements  I discussed the assessment and treatment plan with the patient. The patient was provided an opportunity to ask questions and all were answered. The patient agreed with the plan and demonstrated an understanding of the instructions.  Patient advised to call back or seek an in-person evaluation  if the symptoms or condition worsens.  Duration of encounter: 15 minutes.  Note by: Edward Jolly, MD Date: 04/18/2023; Time: 2:56 PM

## 2023-04-18 NOTE — Telephone Encounter (Signed)
Home Health Verbal Orders - Caller/Agency:[facility]/Stephanie Callback Number: [phone]  Service Requested: Occupational Therapy Frequency: 1x 4 weeks  and 1x a week for every other week for 2 visits  Any new concerns about the patient? No

## 2023-04-18 NOTE — Telephone Encounter (Signed)
Ok for verbal orders.    Andreya Lacks Simmons-Robinson, MD  [facility]  

## 2023-04-19 ENCOUNTER — Ambulatory Visit (INDEPENDENT_AMBULATORY_CARE_PROVIDER_SITE_OTHER): Payer: Medicare Other | Admitting: Family Medicine

## 2023-04-19 ENCOUNTER — Encounter: Payer: Self-pay | Admitting: Family Medicine

## 2023-04-19 VITALS — BP 125/74 | HR 73 | Resp 20 | Ht 70.0 in | Wt 174.0 lb

## 2023-04-19 DIAGNOSIS — I700 Atherosclerosis of aorta: Secondary | ICD-10-CM | POA: Insufficient documentation

## 2023-04-19 DIAGNOSIS — E1042 Type 1 diabetes mellitus with diabetic polyneuropathy: Secondary | ICD-10-CM

## 2023-04-19 DIAGNOSIS — N3281 Overactive bladder: Secondary | ICD-10-CM | POA: Diagnosis not present

## 2023-04-19 DIAGNOSIS — Z09 Encounter for follow-up examination after completed treatment for conditions other than malignant neoplasm: Secondary | ICD-10-CM

## 2023-04-19 DIAGNOSIS — E11649 Type 2 diabetes mellitus with hypoglycemia without coma: Secondary | ICD-10-CM

## 2023-04-19 DIAGNOSIS — I6503 Occlusion and stenosis of bilateral vertebral arteries: Secondary | ICD-10-CM

## 2023-04-19 MED ORDER — SOLIFENACIN SUCCINATE 10 MG PO TABS: ORAL_TABLET | ORAL | 2 refills | Status: DC

## 2023-04-19 NOTE — Telephone Encounter (Signed)
Verbal orders given to [facility].

## 2023-04-19 NOTE — Progress Notes (Signed)
 Established patient visit   Patient: Joyce Brown   DOB: 08/03/1950   73 y.o. Female  MRN: [id number] Visit Date: 04/19/2023  Today's healthcare provider: Rockie Agent, MD   Chief Complaint  Patient presents with   Hospitalization Follow-up   Subjective       Discussed the use of AI scribe software for clinical note transcription with the patient, who gave verbal consent to proceed.  History of Present Illness   Joyce Brown is a 74 year old female with type 1 diabetes who presents for hospital follow-up after treatment for hypoglycemia. She was informed by her sister, who was on the phone with her at the time of the hypoglycemic event.  She experienced severe hypoglycemia, with blood sugar levels dropping as low as 31 mg/dL, leading to encephalopathy. She does not recall the event but was informed by her sister that she 'sort of faded away.' Emergency services were contacted, and she was admitted to the hospital. During her hospital stay, her blood glucose was initially as low as 10 mg/dL but improved to 884 mg/dL after treatment with dextrose . A CT angiogram of the head and neck showed atherosclerosis but no acute intracranial abnormalities. She had severe right intradural vertebral artery stenosis and moderate stenosis in the right and left P2 PCA. She was treated with potassium supplementation, and her blood glucose levels normalized.  She has type 1 diabetes and uses an Omnipod insulin  pump with a Novolog  cartridge, with a maximum daily dose of 60 units per correction. She was without a glucose sensor for January, which led to erratic blood sugar levels, ranging from 39 mg/dL to 442 mg/dL. She has since obtained new sensors and reports her blood sugar readings are now under 200 mg/dL.  Her Myrbetriq, taken at 100 mg daily, has been less effective since her blood glucose levels increased. She previously switched from oxybutynin  due to dry mouth. She  experienced an altered sense of taste during her hospital stay, where everything tasted like water, but this has been improving since discharge.  She has home health services in place, including nursing, physical therapy, and occupational therapy. She also has a home health aide to assist with bathing due to skin breakdown from prolonged sitting during her hospital stay. She manages her medications and bills independently and has groceries delivered. No swelling in her legs.         Past Medical History:  Diagnosis Date   Arthritis 1995   Have family history of it.   Asthma 1995   Has resolved   Cataract 2017   Family History of them   Cerebellar ataxia (HCC)    Depression 1996   Due to Fibromyalgia.   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   Glaucoma Various   Family History of it.   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   antiseptic oral rinse (BIOTENE) LIQD 15 mLs by Mouth Rinse route in the morning, at noon, in the evening, and at bedtime.   atorvastatin  (LIPITOR ) 80 MG tablet Take 1 tablet (80 mg total) by mouth daily.   BD INSULIN  SYRINGE U/F 1/2UNIT 31G X 5/16 0.3 ML MISC USE 3 TIMES A DAY   carboxymethylcellulose (REFRESH PLUS) 0.5 % SOLN Place 1 drop into both eyes 3 (three) times daily as needed.    Continuous Blood Gluc Sensor (DEXCOM G7 SENSOR) MISC by Does not apply route.   docusate sodium  (COLACE) 100 MG capsule Take 100 mg by mouth daily as needed for mild constipation.   estradiol  (ESTRACE ) 0.1 MG/GM vaginal cream Place 1 Applicatorful vaginally 2 (two) times a week.   gabapentin  (NEURONTIN ) 100 MG capsule Take 2 capsules (200 mg total) by mouth 3 (three) times daily.   Glucagon  (BAQSIMI  TWO PACK) 3 MG/DOSE POWD Place 3 mg into the nose as needed (For hypoglycemia).   glucose blood test strip Use   insulin  aspart (NOVOLOG  PENFILL) cartridge USE MAX OF DAILY DOSE OF 60 UNITS PER CORRECTION SCALE AS DIRECTED (Patient taking differently: USE MAX OF DAILY DOSE OF 60 UNITS PER CORRECTION SCALE AS DIRECTED Taking 8 units before breakfast; 10 units before lunch; 7 units before dinner)   Insulin  Disposable Pump (OMNIPOD 5 DEXG7G6 PODS GEN 5) MISC SMARTSIG:SUB-Q Every 3 Days   Insulin  Pen Needle (DROPLET PEN NEEDLES) 31G X 5 MM MISC USE 4 TIMES DAILY AS DIRECTED BY YOUR DOCTOR   Microlet Lancets MISC 3 (three) times daily.   MIEBO 1.338 GM/ML SOLN Apply 1 drop to eye 4 (four) times daily.   Multiple Vitamin (MULTI-VITAMIN) tablet Take 2 tablets by mouth daily.   Omega 3 1200 MG CAPS Take by mouth. One daily   RSV vaccine recomb adjuvanted (AREXVY ) 120 MCG/0.5ML injection Inject into the muscle.   sertraline  (ZOLOFT ) 100 MG tablet TAKE 1 TABLET EVERY DAY   Sodium Fluoride  1.1 % PSTE Use 1 Application Nightly after brushing/flossing. Spit out excess. No rinse/eat/drink for at least 30 minutes after application   tiZANidine  (ZANAFLEX ) 2 MG tablet TAKE 1 TO 2 TABLETS BY MOUTH AT BEDTIME AS NEEDED FOR MUSCLE SPASMS   [DISCONTINUED] Mirabegron (MYRBETRIQ PO) Take 1 tablet by mouth daily.   No facility-administered medications prior to visit.    Review of Systems  Last metabolic panel Lab Results  Component Value Date   GLUCOSE 237 (H) 04/11/2023   NA 137 04/11/2023   K 4.3 04/11/2023   CL  105 04/11/2023   CO2 23 04/11/2023   BUN 19 04/11/2023   CREATININE 0.93 04/11/2023   GFRNONAA >60 04/11/2023   CALCIUM  8.9 04/11/2023   PHOS 3.5 04/10/2023   PROT 6.8 04/10/2023   ALBUMIN 3.9 04/10/2023   LABGLOB 2.0 06/09/2022   AGRATIO 2.1 06/09/2022   BILITOT 0.4 04/10/2023   ALKPHOS 68 04/10/2023   AST 35 04/10/2023   ALT 48 (H) 04/10/2023   ANIONGAP 9 04/11/2023   Last hemoglobin A1c Lab Results  Component Value Date   HGBA1C 9.1 (H) 04/11/2023        Objective    BP 125/74   Pulse 73   Resp 20   Ht 5' 10 (1.778 m)   Wt 174 lb (78.9 kg)   SpO2 99%   BMI 24.97 kg/m  BP Readings from Last 3  Encounters:  04/19/23 125/74  04/12/23 135/71  04/04/23 130/66   Wt Readings from Last 3 Encounters:  04/19/23 174 lb (78.9 kg)  04/04/23 174 lb (78.9 kg)  03/24/23 174 lb (78.9 kg)        Physical Exam  General: Alert, no acute distress Cardio: Normal S1 and S2, RRR, no r/m/g Pulm: CTAB, normal work of breathing   No results found for any visits on 04/19/23.  Assessment & Plan     Problem List Items Addressed This Visit       Cardiovascular and Mediastinum   Aortic atherosclerosis (HCC)   Other Visit Diagnoses       Hypoglycemia associated with diabetes (HCC)    -  Primary     Hospital discharge follow-up         Vertebral artery stenosis, bilateral         Overactive bladder       Relevant Medications   solifenacin  (VESICARE ) 10 MG tablet       Hypoglycemia [age]-year-old female with a recent episode of severe hypoglycemia, presenting for follow-up after hospital treatment. Blood glucose was critically low at 10 mg/dL in the ED on January 26, with subsequent encephalopathy. Blood glucose improved to 115 mg/dL after dextrose  treatment. She resumed her type 1 diabetes regimen, including Novolog  with a maximum daily dose of 60 units per correction, and uses an Omnipod insulin  pump. She was without a sensor for the month of January, leading to  erratic blood glucose levels. Discussed the importance of continuous glucose monitoring to prevent future episodes. She has a follow-up scheduled with her endocrinologist on April 16 but may need an earlier appointment. - Continue current diabetes management regimen - Ensure continuous glucose monitoring with new sensors from [facility] pharmacy - Follow up with endocrinologist on April 16 - Consider earlier endocrinology appointment if needed  Type 1 Diabetes Mellitus Long-standing type 1 diabetes mellitus with recent complications due to lack of continuous glucose monitoring. Blood glucose levels have been highly variable, ranging from 39 to 557 mg/dL. She has experienced better control with new sensors, with recent readings under 200 mg/dL. Discussed the risks of both hyperglycemia and hypoglycemia, including potential for diabetic ketoacidosis and hypoglycemic coma. Emphasized the need for consistent monitoring and timely insulin  administration. - Continue using Omnipod insulin  pump - Monitor blood glucose levels closely with new sensors - Follow up with endocrinologist on April 16  Atherosclerosis CT angiography revealed atherosclerosis with severe right intradural vertebral artery stenosis and moderate right and left P2 PCA stenosis. No acute intracranial abnormality or emergent large vessel occlusion was noted. Discussed the importance of monitoring for symptoms of cerebrovascular insufficiency and maintaining cardiovascular health. - Monitor for symptoms of cerebrovascular insufficiency - Follow up with primary care physician as needed  Urinary Incontinence Reports that Myrbetriq (100 mg daily) has been less effective in managing urinary incontinence since her blood glucose levels have been higher. Previously switched from oxybutynin  due to dry mouth. Discussed alternative medications including VESIcare  and trospium. She opted to try VESIcare  starting at 5 mg daily, with a plan to  increase to 10 mg after 4 weeks if tolerated. - Discontinue Myrbetriq - Start VESIcare  5 mg daily, increase to 10 mg after 4 weeks if tolerated - Follow up in 6 weeks to assess effectiveness  General Health Maintenance Missed mammogram and bone density appointments. - Order mammogram and bone density scan - Ensure patient has contact information for scheduling  Follow-up - Follow up with  primary care physician in 6 weeks - Reschedule end of February appointment to later date - Ensure follow-up with endocrinologist on April 16.         Return in about 6 weeks (around 05/31/2023) for bladder .         Rockie Agent, MD  [facility] [phone] (phone) [phone] (fax)  [facility]

## 2023-04-22 DIAGNOSIS — E876 Hypokalemia: Secondary | ICD-10-CM | POA: Diagnosis not present

## 2023-04-22 DIAGNOSIS — E10649 Type 1 diabetes mellitus with hypoglycemia without coma: Secondary | ICD-10-CM | POA: Diagnosis not present

## 2023-04-22 DIAGNOSIS — E785 Hyperlipidemia, unspecified: Secondary | ICD-10-CM | POA: Diagnosis not present

## 2023-04-22 DIAGNOSIS — E1042 Type 1 diabetes mellitus with diabetic polyneuropathy: Secondary | ICD-10-CM | POA: Diagnosis not present

## 2023-04-22 DIAGNOSIS — L89151 Pressure ulcer of sacral region, stage 1: Secondary | ICD-10-CM | POA: Diagnosis not present

## 2023-04-22 DIAGNOSIS — I10 Essential (primary) hypertension: Secondary | ICD-10-CM | POA: Diagnosis not present

## 2023-04-25 DIAGNOSIS — E10649 Type 1 diabetes mellitus with hypoglycemia without coma: Secondary | ICD-10-CM | POA: Diagnosis not present

## 2023-04-25 DIAGNOSIS — E1042 Type 1 diabetes mellitus with diabetic polyneuropathy: Secondary | ICD-10-CM | POA: Diagnosis not present

## 2023-04-25 DIAGNOSIS — I10 Essential (primary) hypertension: Secondary | ICD-10-CM | POA: Diagnosis not present

## 2023-04-25 DIAGNOSIS — E876 Hypokalemia: Secondary | ICD-10-CM | POA: Diagnosis not present

## 2023-04-25 DIAGNOSIS — L89151 Pressure ulcer of sacral region, stage 1: Secondary | ICD-10-CM | POA: Diagnosis not present

## 2023-04-25 DIAGNOSIS — E785 Hyperlipidemia, unspecified: Secondary | ICD-10-CM | POA: Diagnosis not present

## 2023-04-25 NOTE — Addendum Note (Signed)
 Addended by: SIMMONS-ROBINSON, Racheal Mathurin L on: 04/25/2023 03:13 PM   Modules accepted: Level of Service

## 2023-04-27 DIAGNOSIS — E785 Hyperlipidemia, unspecified: Secondary | ICD-10-CM | POA: Diagnosis not present

## 2023-04-27 DIAGNOSIS — E1042 Type 1 diabetes mellitus with diabetic polyneuropathy: Secondary | ICD-10-CM | POA: Diagnosis not present

## 2023-04-27 DIAGNOSIS — I10 Essential (primary) hypertension: Secondary | ICD-10-CM | POA: Diagnosis not present

## 2023-04-27 DIAGNOSIS — E10649 Type 1 diabetes mellitus with hypoglycemia without coma: Secondary | ICD-10-CM | POA: Diagnosis not present

## 2023-04-27 DIAGNOSIS — L89151 Pressure ulcer of sacral region, stage 1: Secondary | ICD-10-CM | POA: Diagnosis not present

## 2023-04-27 DIAGNOSIS — E876 Hypokalemia: Secondary | ICD-10-CM | POA: Diagnosis not present

## 2023-04-28 DIAGNOSIS — E785 Hyperlipidemia, unspecified: Secondary | ICD-10-CM | POA: Diagnosis not present

## 2023-04-28 DIAGNOSIS — L89151 Pressure ulcer of sacral region, stage 1: Secondary | ICD-10-CM | POA: Diagnosis not present

## 2023-04-28 DIAGNOSIS — E1042 Type 1 diabetes mellitus with diabetic polyneuropathy: Secondary | ICD-10-CM | POA: Diagnosis not present

## 2023-04-28 DIAGNOSIS — E876 Hypokalemia: Secondary | ICD-10-CM | POA: Diagnosis not present

## 2023-04-28 DIAGNOSIS — E10649 Type 1 diabetes mellitus with hypoglycemia without coma: Secondary | ICD-10-CM | POA: Diagnosis not present

## 2023-04-28 DIAGNOSIS — I10 Essential (primary) hypertension: Secondary | ICD-10-CM | POA: Diagnosis not present

## 2023-05-02 DIAGNOSIS — E1042 Type 1 diabetes mellitus with diabetic polyneuropathy: Secondary | ICD-10-CM | POA: Diagnosis not present

## 2023-05-02 DIAGNOSIS — E876 Hypokalemia: Secondary | ICD-10-CM | POA: Diagnosis not present

## 2023-05-02 DIAGNOSIS — E785 Hyperlipidemia, unspecified: Secondary | ICD-10-CM | POA: Diagnosis not present

## 2023-05-02 DIAGNOSIS — L89151 Pressure ulcer of sacral region, stage 1: Secondary | ICD-10-CM | POA: Diagnosis not present

## 2023-05-02 DIAGNOSIS — E10649 Type 1 diabetes mellitus with hypoglycemia without coma: Secondary | ICD-10-CM | POA: Diagnosis not present

## 2023-05-02 DIAGNOSIS — I10 Essential (primary) hypertension: Secondary | ICD-10-CM | POA: Diagnosis not present

## 2023-05-03 DIAGNOSIS — E785 Hyperlipidemia, unspecified: Secondary | ICD-10-CM | POA: Diagnosis not present

## 2023-05-03 DIAGNOSIS — E876 Hypokalemia: Secondary | ICD-10-CM | POA: Diagnosis not present

## 2023-05-03 DIAGNOSIS — I10 Essential (primary) hypertension: Secondary | ICD-10-CM | POA: Diagnosis not present

## 2023-05-03 DIAGNOSIS — L89151 Pressure ulcer of sacral region, stage 1: Secondary | ICD-10-CM | POA: Diagnosis not present

## 2023-05-03 DIAGNOSIS — E1042 Type 1 diabetes mellitus with diabetic polyneuropathy: Secondary | ICD-10-CM | POA: Diagnosis not present

## 2023-05-03 DIAGNOSIS — E10649 Type 1 diabetes mellitus with hypoglycemia without coma: Secondary | ICD-10-CM | POA: Diagnosis not present

## 2023-05-06 ENCOUNTER — Ambulatory Visit: Payer: Medicare Other | Admitting: Podiatry

## 2023-05-06 DIAGNOSIS — E876 Hypokalemia: Secondary | ICD-10-CM | POA: Diagnosis not present

## 2023-05-06 DIAGNOSIS — E1042 Type 1 diabetes mellitus with diabetic polyneuropathy: Secondary | ICD-10-CM | POA: Diagnosis not present

## 2023-05-06 DIAGNOSIS — E785 Hyperlipidemia, unspecified: Secondary | ICD-10-CM | POA: Diagnosis not present

## 2023-05-06 DIAGNOSIS — I10 Essential (primary) hypertension: Secondary | ICD-10-CM | POA: Diagnosis not present

## 2023-05-06 DIAGNOSIS — E10649 Type 1 diabetes mellitus with hypoglycemia without coma: Secondary | ICD-10-CM | POA: Diagnosis not present

## 2023-05-06 DIAGNOSIS — L89151 Pressure ulcer of sacral region, stage 1: Secondary | ICD-10-CM | POA: Diagnosis not present

## 2023-05-09 DIAGNOSIS — L89151 Pressure ulcer of sacral region, stage 1: Secondary | ICD-10-CM | POA: Diagnosis not present

## 2023-05-09 DIAGNOSIS — E10649 Type 1 diabetes mellitus with hypoglycemia without coma: Secondary | ICD-10-CM | POA: Diagnosis not present

## 2023-05-09 DIAGNOSIS — E1042 Type 1 diabetes mellitus with diabetic polyneuropathy: Secondary | ICD-10-CM | POA: Diagnosis not present

## 2023-05-09 DIAGNOSIS — E876 Hypokalemia: Secondary | ICD-10-CM | POA: Diagnosis not present

## 2023-05-09 DIAGNOSIS — I10 Essential (primary) hypertension: Secondary | ICD-10-CM | POA: Diagnosis not present

## 2023-05-09 DIAGNOSIS — E785 Hyperlipidemia, unspecified: Secondary | ICD-10-CM | POA: Diagnosis not present

## 2023-05-10 DIAGNOSIS — E785 Hyperlipidemia, unspecified: Secondary | ICD-10-CM | POA: Diagnosis not present

## 2023-05-10 DIAGNOSIS — E1042 Type 1 diabetes mellitus with diabetic polyneuropathy: Secondary | ICD-10-CM | POA: Diagnosis not present

## 2023-05-10 DIAGNOSIS — L89151 Pressure ulcer of sacral region, stage 1: Secondary | ICD-10-CM | POA: Diagnosis not present

## 2023-05-10 DIAGNOSIS — E10649 Type 1 diabetes mellitus with hypoglycemia without coma: Secondary | ICD-10-CM | POA: Diagnosis not present

## 2023-05-10 DIAGNOSIS — E876 Hypokalemia: Secondary | ICD-10-CM | POA: Diagnosis not present

## 2023-05-10 DIAGNOSIS — I10 Essential (primary) hypertension: Secondary | ICD-10-CM | POA: Diagnosis not present

## 2023-05-11 NOTE — Progress Notes (Unsigned)
 Established patient visit   Patient: Joyce Brown   DOB: 1950/09/26   73 y.o. Female  MRN: [id number] Visit Date: 05/12/2023  Today's healthcare provider: Ronnald Ramp, MD   No chief complaint on file.  Subjective       Discussed the use of AI scribe software for clinical note transcription with the patient, who gave verbal consent to proceed.  History of Present Illness             Past Medical History:  Diagnosis Date   Arthritis 1995   Have family history of it.   Asthma 1995   Has resolved   Cataract 2017   Family History of them   Cerebellar ataxia (HCC)    Depression 1996   Due to Fibromyalgia.   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   Glaucoma Various   Family History of it.   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   antiseptic oral rinse (BIOTENE) LIQD 15 mLs by Mouth Rinse route in the morning, at noon, in the evening, and at bedtime.   atorvastatin (LIPITOR) 80 MG tablet Take 1 tablet (80 mg total) by mouth daily.   BD INSULIN SYRINGE U/F 1/2UNIT 31G X 5/16" 0.3 ML MISC USE 3 TIMES A DAY   carboxymethylcellulose (REFRESH PLUS) 0.5 % SOLN Place 1 drop into both eyes 3 (three) times daily as needed.   Continuous Blood Gluc Sensor (DEXCOM G7 SENSOR) MISC by Does not  apply route.   docusate sodium (COLACE) 100 MG capsule Take 100 mg by mouth daily as needed for mild constipation.   estradiol (ESTRACE) 0.1 MG/GM vaginal cream Place 1 Applicatorful vaginally 2 (two) times a week.   gabapentin (NEURONTIN) 100 MG capsule Take 2 capsules (200 mg total) by mouth 3 (three) times daily.   Glucagon (BAQSIMI TWO PACK) 3 MG/DOSE POWD Place 3 mg into the nose as needed (For hypoglycemia).   glucose blood test strip Use   insulin aspart (NOVOLOG PENFILL) cartridge USE MAX OF DAILY DOSE OF 60 UNITS PER CORRECTION SCALE AS DIRECTED (Patient taking differently: USE MAX OF DAILY DOSE OF 60 UNITS PER CORRECTION SCALE AS DIRECTED Taking 8 units before breakfast; 10 units before lunch; 7 units before dinner)   Insulin Disposable Pump (OMNIPOD 5 DEXG7G6 PODS GEN 5) MISC SMARTSIG:SUB-Q Every 3 Days   Insulin Pen Needle (DROPLET PEN NEEDLES) 31G X 5 MM MISC USE 4 TIMES DAILY AS DIRECTED BY YOUR DOCTOR   Microlet Lancets MISC 3 (three) times daily.   MIEBO 1.338 GM/ML SOLN Apply 1 drop to eye 4 (four) times daily.   Multiple Vitamin (MULTI-VITAMIN) tablet Take 2 tablets by mouth daily.   Omega 3 1200 MG CAPS Take by mouth. One daily   RSV  vaccine recomb adjuvanted (AREXVY) 120 MCG/0.5ML injection Inject into the muscle.   sertraline (ZOLOFT) 100 MG tablet TAKE 1 TABLET EVERY DAY   Sodium Fluoride 1.1 % PSTE Use 1 Application Nightly after brushing/flossing. Spit out excess. No rinse/eat/drink for at least 30 minutes after application   solifenacin (VESICARE) 10 MG tablet Take 0.5 tablets (5 mg total) by mouth daily for 28 days, THEN 1 tablet (10 mg total) daily.   tiZANidine (ZANAFLEX) 2 MG tablet TAKE 1 TO 2 TABLETS BY MOUTH AT BEDTIME AS NEEDED FOR MUSCLE SPASMS   No facility-administered medications prior to visit.    Review of Systems  {Insert previous labs (optional):23779} {See past labs  Heme  Chem  Endocrine  Serology  Results Review (optional):1}   Objective     There were no vitals taken for this visit. {Insert last BP/Wt (optional):23777}{See vitals history (optional):1}    Physical Exam  ***  No results found for any visits on 05/12/23.  Assessment & Plan     Problem List Items Addressed This Visit   None   Assessment and Plan              No follow-ups on file.         Ronnald Ramp, MD  [facility] [phone] (phone) [phone] (fax)  [facility]

## 2023-05-12 ENCOUNTER — Telehealth: Payer: Self-pay | Admitting: Family Medicine

## 2023-05-12 ENCOUNTER — Encounter: Payer: Self-pay | Admitting: Family Medicine

## 2023-05-12 ENCOUNTER — Ambulatory Visit (INDEPENDENT_AMBULATORY_CARE_PROVIDER_SITE_OTHER): Payer: Medicare Other | Admitting: Family Medicine

## 2023-05-12 VITALS — BP 113/67 | HR 69 | Ht 70.0 in | Wt 157.0 lb

## 2023-05-12 DIAGNOSIS — Z7409 Other reduced mobility: Secondary | ICD-10-CM | POA: Diagnosis not present

## 2023-05-12 DIAGNOSIS — I700 Atherosclerosis of aorta: Secondary | ICD-10-CM | POA: Diagnosis not present

## 2023-05-12 DIAGNOSIS — Z0000 Encounter for general adult medical examination without abnormal findings: Secondary | ICD-10-CM

## 2023-05-12 DIAGNOSIS — E785 Hyperlipidemia, unspecified: Secondary | ICD-10-CM | POA: Diagnosis not present

## 2023-05-12 DIAGNOSIS — F329 Major depressive disorder, single episode, unspecified: Secondary | ICD-10-CM | POA: Diagnosis not present

## 2023-05-12 DIAGNOSIS — E1042 Type 1 diabetes mellitus with diabetic polyneuropathy: Secondary | ICD-10-CM | POA: Diagnosis not present

## 2023-05-12 DIAGNOSIS — N3281 Overactive bladder: Secondary | ICD-10-CM | POA: Diagnosis not present

## 2023-05-12 DIAGNOSIS — Z993 Dependence on wheelchair: Secondary | ICD-10-CM

## 2023-05-12 DIAGNOSIS — I10 Essential (primary) hypertension: Secondary | ICD-10-CM | POA: Diagnosis not present

## 2023-05-12 DIAGNOSIS — E10649 Type 1 diabetes mellitus with hypoglycemia without coma: Secondary | ICD-10-CM | POA: Diagnosis not present

## 2023-05-12 DIAGNOSIS — M797 Fibromyalgia: Secondary | ICD-10-CM

## 2023-05-12 DIAGNOSIS — J4530 Mild persistent asthma, uncomplicated: Secondary | ICD-10-CM | POA: Diagnosis not present

## 2023-05-12 DIAGNOSIS — F4323 Adjustment disorder with mixed anxiety and depressed mood: Secondary | ICD-10-CM | POA: Diagnosis not present

## 2023-05-12 DIAGNOSIS — J45909 Unspecified asthma, uncomplicated: Secondary | ICD-10-CM | POA: Diagnosis not present

## 2023-05-12 DIAGNOSIS — L89151 Pressure ulcer of sacral region, stage 1: Secondary | ICD-10-CM | POA: Diagnosis not present

## 2023-05-12 DIAGNOSIS — Z87768 Personal history of other specified (corrected) congenital malformations of integument, limbs and musculoskeletal system: Secondary | ICD-10-CM

## 2023-05-12 DIAGNOSIS — D72829 Elevated white blood cell count, unspecified: Secondary | ICD-10-CM | POA: Diagnosis not present

## 2023-05-12 DIAGNOSIS — Z789 Other specified health status: Secondary | ICD-10-CM

## 2023-05-12 DIAGNOSIS — E876 Hypokalemia: Secondary | ICD-10-CM | POA: Diagnosis not present

## 2023-05-12 DIAGNOSIS — M1990 Unspecified osteoarthritis, unspecified site: Secondary | ICD-10-CM | POA: Diagnosis not present

## 2023-05-12 MED ORDER — TIZANIDINE HCL 2 MG PO TABS: ORAL_TABLET | ORAL | 2 refills | Status: AC

## 2023-05-12 MED ORDER — SOLIFENACIN SUCCINATE 10 MG PO TABS: 10.0000 mg | ORAL_TABLET | Freq: Every day | ORAL | 2 refills | Status: AC

## 2023-05-12 MED ORDER — ATORVASTATIN CALCIUM 80 MG PO TABS: 80.0000 mg | ORAL_TABLET | Freq: Every day | ORAL | 3 refills | Status: DC

## 2023-05-12 NOTE — Assessment & Plan Note (Signed)
 Chronic  BP well controlled  No current medications for HTN prescribed  Continue to monitor BP intermittently

## 2023-05-12 NOTE — Assessment & Plan Note (Signed)
 Chronic  Lab Results  Component Value Date   CHOL 139 09/09/2022   HDL 46.60 09/09/2022   LDLCALC 72 09/09/2022   TRIG 104.0 09/09/2022   CHOLHDL 3 09/09/2022   Recommend repeat lipid panel today  Continue atorvastatin 80mg  daily

## 2023-05-12 NOTE — Assessment & Plan Note (Addendum)
 Chronic  Uses walker at home in her apartment  Continues to work with home health PT and OT  Recommended against using standing home walker at this time until she has more confidence standing after working with PT  Handicap placard updated and signed today

## 2023-05-12 NOTE — Assessment & Plan Note (Addendum)
 Chronic  Stable  Continue zoloft 100mg  daily  Refills sent to pharmacy today

## 2023-05-12 NOTE — Assessment & Plan Note (Signed)
 Chronic  Stable  Continue tizanidine 2-4mg  as needed

## 2023-05-12 NOTE — Assessment & Plan Note (Addendum)
 Chronic  Lab Results  Component Value Date   HGBA1C 9.1 (H) 04/11/2023    Followed by endocrinology Scheduled with Dr. Standley Brooking 06/29/2023 Has insulin pump: Omnipod 5 Novolog penfill cartridge 8 units prior to breakfast, 10 units prior to lunch, 7 before dinner with daily max of 60 units for correction  Continue Dexcom G7 for glucose monitoring  Glucagon 3mg  PRN for hypoglycemia

## 2023-05-12 NOTE — Assessment & Plan Note (Signed)
 Chronic  Stable  Patient using wheelchair during today's visit, independent

## 2023-05-12 NOTE — Telephone Encounter (Signed)
 Certification paperwork was received on 05/12/2023 and placed in provider's mailbox for completion.

## 2023-05-12 NOTE — Assessment & Plan Note (Signed)
 Mammogram scheduled for 06/06/23  Foot exam scheduled with podiatry 05/27/23

## 2023-05-13 ENCOUNTER — Telehealth: Payer: Self-pay

## 2023-05-13 NOTE — Telephone Encounter (Signed)
 Certifications forms were signed yesterday afternoon. Should be faxed today as they were placed in files to be faxed folder in [address]

## 2023-05-13 NOTE — Telephone Encounter (Signed)
 Copied from CRM [id number]. Topic: Clinical - Home Health Verbal Orders >> May 12, 2023 11:35 AM Bobbye Morton wrote: Caller/Agency: [facility]  Callback Number: [phone] Ext [phone] Service Requested: Occupational Therapy, Physical Therapy, Skilled Nursing, and Home Health Aid Frequency:  Papers will be faxed over. With freq for each.  Any new concerns about the patient? No, unsure.

## 2023-05-15 DIAGNOSIS — M545 Low back pain, unspecified: Secondary | ICD-10-CM | POA: Diagnosis not present

## 2023-05-15 DIAGNOSIS — Z86711 Personal history of pulmonary embolism: Secondary | ICD-10-CM | POA: Diagnosis not present

## 2023-05-15 DIAGNOSIS — M797 Fibromyalgia: Secondary | ICD-10-CM | POA: Diagnosis not present

## 2023-05-15 DIAGNOSIS — G8929 Other chronic pain: Secondary | ICD-10-CM | POA: Diagnosis not present

## 2023-05-15 DIAGNOSIS — E876 Hypokalemia: Secondary | ICD-10-CM | POA: Diagnosis not present

## 2023-05-15 DIAGNOSIS — F4323 Adjustment disorder with mixed anxiety and depressed mood: Secondary | ICD-10-CM | POA: Diagnosis not present

## 2023-05-15 DIAGNOSIS — K219 Gastro-esophageal reflux disease without esophagitis: Secondary | ICD-10-CM | POA: Diagnosis not present

## 2023-05-15 DIAGNOSIS — E1042 Type 1 diabetes mellitus with diabetic polyneuropathy: Secondary | ICD-10-CM | POA: Diagnosis not present

## 2023-05-15 DIAGNOSIS — Z90710 Acquired absence of both cervix and uterus: Secondary | ICD-10-CM | POA: Diagnosis not present

## 2023-05-15 DIAGNOSIS — L89151 Pressure ulcer of sacral region, stage 1: Secondary | ICD-10-CM | POA: Diagnosis not present

## 2023-05-15 DIAGNOSIS — D72829 Elevated white blood cell count, unspecified: Secondary | ICD-10-CM | POA: Diagnosis not present

## 2023-05-15 DIAGNOSIS — H9193 Unspecified hearing loss, bilateral: Secondary | ICD-10-CM | POA: Diagnosis not present

## 2023-05-15 DIAGNOSIS — Z9089 Acquired absence of other organs: Secondary | ICD-10-CM | POA: Diagnosis not present

## 2023-05-15 DIAGNOSIS — G4730 Sleep apnea, unspecified: Secondary | ICD-10-CM | POA: Diagnosis not present

## 2023-05-15 DIAGNOSIS — M4800 Spinal stenosis, site unspecified: Secondary | ICD-10-CM | POA: Diagnosis not present

## 2023-05-15 DIAGNOSIS — I10 Essential (primary) hypertension: Secondary | ICD-10-CM | POA: Diagnosis not present

## 2023-05-15 DIAGNOSIS — E785 Hyperlipidemia, unspecified: Secondary | ICD-10-CM | POA: Diagnosis not present

## 2023-05-15 DIAGNOSIS — E11649 Type 2 diabetes mellitus with hypoglycemia without coma: Secondary | ICD-10-CM | POA: Diagnosis not present

## 2023-05-15 DIAGNOSIS — E10649 Type 1 diabetes mellitus with hypoglycemia without coma: Secondary | ICD-10-CM | POA: Diagnosis not present

## 2023-05-15 DIAGNOSIS — J45909 Unspecified asthma, uncomplicated: Secondary | ICD-10-CM | POA: Diagnosis not present

## 2023-05-15 DIAGNOSIS — Z9181 History of falling: Secondary | ICD-10-CM | POA: Diagnosis not present

## 2023-05-15 DIAGNOSIS — I700 Atherosclerosis of aorta: Secondary | ICD-10-CM | POA: Diagnosis not present

## 2023-05-15 DIAGNOSIS — R4182 Altered mental status, unspecified: Secondary | ICD-10-CM | POA: Diagnosis not present

## 2023-05-15 DIAGNOSIS — M1990 Unspecified osteoarthritis, unspecified site: Secondary | ICD-10-CM | POA: Diagnosis not present

## 2023-05-16 DIAGNOSIS — E876 Hypokalemia: Secondary | ICD-10-CM | POA: Diagnosis not present

## 2023-05-16 DIAGNOSIS — E1042 Type 1 diabetes mellitus with diabetic polyneuropathy: Secondary | ICD-10-CM | POA: Diagnosis not present

## 2023-05-16 DIAGNOSIS — I10 Essential (primary) hypertension: Secondary | ICD-10-CM | POA: Diagnosis not present

## 2023-05-16 DIAGNOSIS — L89151 Pressure ulcer of sacral region, stage 1: Secondary | ICD-10-CM | POA: Diagnosis not present

## 2023-05-16 DIAGNOSIS — E10649 Type 1 diabetes mellitus with hypoglycemia without coma: Secondary | ICD-10-CM | POA: Diagnosis not present

## 2023-05-16 DIAGNOSIS — E785 Hyperlipidemia, unspecified: Secondary | ICD-10-CM | POA: Diagnosis not present

## 2023-05-17 DIAGNOSIS — I10 Essential (primary) hypertension: Secondary | ICD-10-CM | POA: Diagnosis not present

## 2023-05-17 DIAGNOSIS — E785 Hyperlipidemia, unspecified: Secondary | ICD-10-CM | POA: Diagnosis not present

## 2023-05-17 DIAGNOSIS — E10649 Type 1 diabetes mellitus with hypoglycemia without coma: Secondary | ICD-10-CM | POA: Diagnosis not present

## 2023-05-17 DIAGNOSIS — E876 Hypokalemia: Secondary | ICD-10-CM | POA: Diagnosis not present

## 2023-05-17 DIAGNOSIS — E1042 Type 1 diabetes mellitus with diabetic polyneuropathy: Secondary | ICD-10-CM | POA: Diagnosis not present

## 2023-05-17 DIAGNOSIS — L89151 Pressure ulcer of sacral region, stage 1: Secondary | ICD-10-CM | POA: Diagnosis not present

## 2023-05-19 DIAGNOSIS — E1042 Type 1 diabetes mellitus with diabetic polyneuropathy: Secondary | ICD-10-CM | POA: Diagnosis not present

## 2023-05-19 DIAGNOSIS — E10649 Type 1 diabetes mellitus with hypoglycemia without coma: Secondary | ICD-10-CM | POA: Diagnosis not present

## 2023-05-19 DIAGNOSIS — I10 Essential (primary) hypertension: Secondary | ICD-10-CM | POA: Diagnosis not present

## 2023-05-19 DIAGNOSIS — E876 Hypokalemia: Secondary | ICD-10-CM | POA: Diagnosis not present

## 2023-05-19 DIAGNOSIS — L89151 Pressure ulcer of sacral region, stage 1: Secondary | ICD-10-CM | POA: Diagnosis not present

## 2023-05-19 DIAGNOSIS — E785 Hyperlipidemia, unspecified: Secondary | ICD-10-CM | POA: Diagnosis not present

## 2023-05-23 DIAGNOSIS — E785 Hyperlipidemia, unspecified: Secondary | ICD-10-CM | POA: Diagnosis not present

## 2023-05-23 DIAGNOSIS — L89151 Pressure ulcer of sacral region, stage 1: Secondary | ICD-10-CM | POA: Diagnosis not present

## 2023-05-23 DIAGNOSIS — I10 Essential (primary) hypertension: Secondary | ICD-10-CM | POA: Diagnosis not present

## 2023-05-23 DIAGNOSIS — E10649 Type 1 diabetes mellitus with hypoglycemia without coma: Secondary | ICD-10-CM | POA: Diagnosis not present

## 2023-05-23 DIAGNOSIS — E1042 Type 1 diabetes mellitus with diabetic polyneuropathy: Secondary | ICD-10-CM | POA: Diagnosis not present

## 2023-05-23 DIAGNOSIS — E876 Hypokalemia: Secondary | ICD-10-CM | POA: Diagnosis not present

## 2023-05-25 DIAGNOSIS — E109 Type 1 diabetes mellitus without complications: Secondary | ICD-10-CM | POA: Diagnosis not present

## 2023-05-25 DIAGNOSIS — M542 Cervicalgia: Secondary | ICD-10-CM | POA: Diagnosis not present

## 2023-05-25 DIAGNOSIS — M62838 Other muscle spasm: Secondary | ICD-10-CM | POA: Diagnosis not present

## 2023-05-25 DIAGNOSIS — R2681 Unsteadiness on feet: Secondary | ICD-10-CM | POA: Diagnosis not present

## 2023-05-25 DIAGNOSIS — M79641 Pain in right hand: Secondary | ICD-10-CM | POA: Diagnosis not present

## 2023-05-25 DIAGNOSIS — M79642 Pain in left hand: Secondary | ICD-10-CM | POA: Diagnosis not present

## 2023-05-25 DIAGNOSIS — M6289 Other specified disorders of muscle: Secondary | ICD-10-CM | POA: Diagnosis not present

## 2023-05-25 DIAGNOSIS — G119 Hereditary ataxia, unspecified: Secondary | ICD-10-CM | POA: Diagnosis not present

## 2023-05-25 DIAGNOSIS — E10649 Type 1 diabetes mellitus with hypoglycemia without coma: Secondary | ICD-10-CM | POA: Diagnosis not present

## 2023-05-25 DIAGNOSIS — Z9641 Presence of insulin pump (external) (internal): Secondary | ICD-10-CM | POA: Diagnosis not present

## 2023-05-25 DIAGNOSIS — R2689 Other abnormalities of gait and mobility: Secondary | ICD-10-CM | POA: Diagnosis not present

## 2023-05-26 DIAGNOSIS — E876 Hypokalemia: Secondary | ICD-10-CM | POA: Diagnosis not present

## 2023-05-26 DIAGNOSIS — E10649 Type 1 diabetes mellitus with hypoglycemia without coma: Secondary | ICD-10-CM | POA: Diagnosis not present

## 2023-05-26 DIAGNOSIS — I10 Essential (primary) hypertension: Secondary | ICD-10-CM | POA: Diagnosis not present

## 2023-05-26 DIAGNOSIS — L89151 Pressure ulcer of sacral region, stage 1: Secondary | ICD-10-CM | POA: Diagnosis not present

## 2023-05-26 DIAGNOSIS — E1042 Type 1 diabetes mellitus with diabetic polyneuropathy: Secondary | ICD-10-CM | POA: Diagnosis not present

## 2023-05-26 DIAGNOSIS — E785 Hyperlipidemia, unspecified: Secondary | ICD-10-CM | POA: Diagnosis not present

## 2023-05-27 ENCOUNTER — Ambulatory Visit: Payer: Medicare Other | Admitting: Podiatry

## 2023-05-27 ENCOUNTER — Telehealth: Payer: Self-pay

## 2023-05-27 ENCOUNTER — Ambulatory Visit: Payer: Self-pay | Admitting: Family Medicine

## 2023-05-27 ENCOUNTER — Encounter: Payer: Self-pay | Admitting: Podiatry

## 2023-05-27 DIAGNOSIS — M2010 Hallux valgus (acquired), unspecified foot: Secondary | ICD-10-CM | POA: Diagnosis not present

## 2023-05-27 DIAGNOSIS — I10 Essential (primary) hypertension: Secondary | ICD-10-CM | POA: Diagnosis not present

## 2023-05-27 DIAGNOSIS — E119 Type 2 diabetes mellitus without complications: Secondary | ICD-10-CM

## 2023-05-27 DIAGNOSIS — E10649 Type 1 diabetes mellitus with hypoglycemia without coma: Secondary | ICD-10-CM | POA: Diagnosis not present

## 2023-05-27 DIAGNOSIS — M2040 Other hammer toe(s) (acquired), unspecified foot: Secondary | ICD-10-CM

## 2023-05-27 DIAGNOSIS — M79675 Pain in left toe(s): Secondary | ICD-10-CM | POA: Diagnosis not present

## 2023-05-27 DIAGNOSIS — E1042 Type 1 diabetes mellitus with diabetic polyneuropathy: Secondary | ICD-10-CM | POA: Diagnosis not present

## 2023-05-27 DIAGNOSIS — M79674 Pain in right toe(s): Secondary | ICD-10-CM | POA: Diagnosis not present

## 2023-05-27 DIAGNOSIS — B351 Tinea unguium: Secondary | ICD-10-CM | POA: Diagnosis not present

## 2023-05-27 DIAGNOSIS — E876 Hypokalemia: Secondary | ICD-10-CM | POA: Diagnosis not present

## 2023-05-27 DIAGNOSIS — E785 Hyperlipidemia, unspecified: Secondary | ICD-10-CM | POA: Diagnosis not present

## 2023-05-27 DIAGNOSIS — L89151 Pressure ulcer of sacral region, stage 1: Secondary | ICD-10-CM | POA: Diagnosis not present

## 2023-05-27 NOTE — Telephone Encounter (Signed)
 Patient inquired about Dm shoes order was CXLD as Dr never sent back ppw I am refaxing today and patient is sending Dr. Mervyn Skeeters message

## 2023-05-27 NOTE — Telephone Encounter (Signed)
 Copied from CRM [id number]. Topic: Clinical - Red Word Triage >> May 27, 2023  2:22 PM Payton Doughty wrote: Red Word that prompted transfer to Nurse Triage: stephanie w/ [facility] is w/ the pt, reporting high bp of 170/98 Reason for Disposition  Systolic BP  >= 160 OR Diastolic >= 100  Answer Assessment - Initial Assessment Questions 1. BLOOD PRESSURE: "What is the blood pressure?" "Did you take at least two measurements 5 minutes apart?"     Judeth Cornfield, nurse with [facility] is with pt.   170/98.    No symptoms.    It's was a little low on Wed.        In Feb. It was slightly higher a month ago.    Not on BP medication.  It's usually normal  2. ONSET: "When did you take your blood pressure?"     Just now with home care nurse 3. HOW: "How did you take your blood pressure?" (e.g., automatic home BP monitor, visiting nurse)     monitor 4. HISTORY: "Do you have a history of high blood pressure?"     Yes 20 yrs ago 5. MEDICINES: "Are you taking any medicines for blood pressure?" "Have you missed any doses recently?"     No BP medications 6. OTHER SYMPTOMS: "Do you have any symptoms?" (e.g., blurred vision, chest pain, difficulty breathing, headache, weakness)     No symptoms 7. PREGNANCY: "Is there any chance you are pregnant?" "When was your last menstrual period?"     N/A due to age  Protocols used: Blood Pressure - High-A-AH  Chief Complaint: BP elevated for past month or so.   Judeth Cornfield, nurse with [facility] calling in with pt there with her for an appt. Symptoms: Denies any symptoms, headache, or dizziness Frequency: for the last month or so. Pertinent Negatives: Patient denies symptoms Disposition: [] ED /[] Urgent Care (no appt availability in office) / [x] Appointment(In office/virtual)/ []  [facility] [facility]/ [] Home Care/ [] Refused Recommended Disposition /[] [place] [facility]/ []  Follow-up with PCP Additional Notes: Appt made for Monday 05/30/2023

## 2023-05-27 NOTE — Progress Notes (Signed)
 ANNUAL DIABETIC FOOT EXAM  Subjective: Joyce Brown presents today for annual diabetic foot exam.  Chief Complaint  Patient presents with   Diabetes    "I'm going to get my nails cut."   Patient confirms h/o diabetes.  Patient denies any h/o foot wounds.  Patient has been diagnosed with neuropathy.  Simmons-Robinson, Tawanna Cooler, MD is patient's PCP.  Past Medical History:  Diagnosis Date   Arthritis 1995   Have family history of it.   Asthma 1995   Has resolved   Cataract 2017   Family History of them   Cerebellar ataxia (HCC)    Depression 1996   Due to Fibromyalgia.   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   Glaucoma Various   Family History of it.   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   Patient Active Problem List   Diagnosis Date Noted   Aortic atherosclerosis (HCC) 04/19/2023   HTN (hypertension) 04/11/2023   Asthma 04/11/2023   Polyp of cecum 03/24/2023   Cervical radicular pain 03/02/2023   Insulin pump status 01/26/2023   Encounter for annual wellness visit (AWV) in Medicare patient 01/11/2023   Cervicalgia 12/09/2022   Arthritis of carpometacarpal [facility]) joint of both thumbs 12/09/2022   Healthcare maintenance 11/10/2022   Facial rash 08/17/2022   Rectal prolapse 08/17/2022   Dry eye 08/12/2022   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   Bilateral tennis elbow 06/15/2021   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   Type 1 diabetes mellitus with diabetic polyneuropathy (HCC) 12/25/2020   Diabetic neuropathy (HCC) 12/09/2020   Myofascial pain 09/12/2020   Wheelchair dependence 09/12/2020   Lumbar facet arthropathy 04/27/2020   Bilateral impacted cerumen 08/21/2019   Sensorineural hearing loss (SNHL) of both ears 08/21/2019   End of life care 08/15/2019   Tinnitus, bilateral 06/27/2019   Breast calcifications 12/28/2017   Hypokalemia 12/06/2017   Cerebellar ataxia (HCC)    Vitamin B6 deficiency 11/10/2017   Gastric bypass status for obesity 10/20/2016   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   Status post bariatric surgery 10/20/2016   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) 06/20/2016   Depression 06/16/2015   Hyperparathyroidism (HCC) 10/29/2014   Type 1 diabetes mellitus without complication (HCC) 10/01/2014   Urge incontinence of urine 05/28/2014   Dyslipidemia 02/19/2014   Spondylosis without myelopathy or radiculopathy, lumbar region 12/17/2013   Fibromyalgia 03/26/2013   Impaired mobility and activities of daily living 11/30/2012   Lichen planus 10/30/2012   Spondylosis of cervical spine 05/16/2012   Adjustment disorder with mixed anxiety and depressed mood 05/11/2012   Spinal stenosis of lumbar region 04/28/2012   Degeneration of lumbar intervertebral disc 04/26/2012   Osteoarthritis of hand 05/24/2011   Past Surgical History:  Procedure Laterality Date   ABDOMINAL HYSTERECTOMY     ABDOMINAL HYSTERECTOMY  1997   Partial Hysterectomy   ADENOIDECTOMY     ANKLE SURGERY Left    BILATERAL CARPAL TUNNEL RELEASE     BREAST BIOPSY Left 2019   benign   COLON SURGERY  09/25/2012   Gastric Bypass Surgery   COLONOSCOPY WITH PROPOFOL N/A 03/24/2023   Procedure: COLONOSCOPY WITH PROPOFOL;  Surgeon: Toney Reil, MD;  Location: [facility] ENDOSCOPY;  Service:  Gastroenterology;  Laterality: N/A;   ELBOW SURGERY     EYE SURGERY  03/14/22 & 03/17/22   Cataract Surgery   GASTRIC BYPASS  2014   HAND NEUROPLASTY     NASAL SINUS SURGERY     POLYPECTOMY  03/24/2023   Procedure: POLYPECTOMY;  Surgeon: Toney Reil, MD;  Location: [facility] ENDOSCOPY;  Service: Gastroenterology;;   radio denervation  05/21/2020   SMALL INTESTINE SURGERY  08/18/2013   Laparoscopic Procedure to Relieve Small Bowel Obstruction   TONSILLECTOMY     Current Outpatient Medications on File Prior to Visit  Medication Sig Dispense Refill   Acetaminophen 500 MG capsule Take 500 mg by mouth every 6 (six) hours as needed for moderate pain.     antiseptic oral rinse (BIOTENE) LIQD 15 mLs by Mouth Rinse route in the morning, at noon, in the evening, and at bedtime.     atorvastatin (LIPITOR) 80 MG tablet Take 1 tablet (80 mg total) by mouth daily. 90 tablet 3   BD INSULIN SYRINGE U/F 1/2UNIT 31G X 5/16" 0.3 ML MISC USE 3 TIMES A DAY 100 each 3   carboxymethylcellulose (REFRESH PLUS) 0.5 % SOLN Place 1 drop into both eyes 3 (three) times daily as needed.     Continuous Blood Gluc Sensor (DEXCOM G7 SENSOR) MISC by Does not apply route.     docusate sodium (COLACE) 100 MG capsule Take 100 mg by mouth daily as needed for mild constipation.     estradiol (ESTRACE) 0.1 MG/GM vaginal cream Place 1 Applicatorful vaginally 2 (two) times a week.     gabapentin (NEURONTIN) 100 MG capsule Take 2 capsules (200 mg total) by mouth 3 (three) times daily.     Glucagon (BAQSIMI TWO PACK) 3 MG/DOSE POWD Place 3 mg into the nose as needed (For hypoglycemia). 1 each 2   glucose blood test strip Use     insulin aspart (NOVOLOG PENFILL) cartridge USE MAX OF DAILY DOSE OF 60 UNITS PER CORRECTION SCALE AS DIRECTED (Patient taking differently: USE MAX OF DAILY DOSE OF 60 UNITS PER CORRECTION SCALE AS DIRECTED Taking 8 units before breakfast; 10 units before lunch; 7 units before dinner) 60 mL 2   Insulin  Disposable Pump (OMNIPOD 5 DEXG7G6 PODS GEN 5) MISC SMARTSIG:SUB-Q Every 3 Days     Insulin Pen Needle (DROPLET PEN NEEDLES) 31G X 5 MM MISC USE 4 TIMES DAILY AS DIRECTED BY YOUR DOCTOR 400 each 3   Microlet Lancets MISC 3 (three) times daily.     Multiple Vitamin (MULTI-VITAMIN) tablet Take 2 tablets by mouth daily.     Omega 3 1200 MG CAPS Take by mouth. One daily     RSV vaccine recomb adjuvanted (AREXVY) 120 MCG/0.5ML injection Inject into the muscle. 0.5 mL 0   sertraline (ZOLOFT) 100 MG tablet TAKE 1 TABLET EVERY DAY 90 tablet 3   Sodium Fluoride 1.1 % PSTE Use 1 Application Nightly after brushing/flossing. Spit out excess. No rinse/eat/drink for at least 30 minutes after application 100 mL 0   solifenacin (VESICARE) 10 MG tablet Take 1 tablet (10  mg total) by mouth daily for 28 days. 90 tablet 2   tiZANidine (ZANAFLEX) 2 MG tablet TAKE 1 TO 2 TABLETS BY MOUTH AT BEDTIME AS NEEDED FOR MUSCLE SPASMS 360 tablet 2   No current facility-administered medications on file prior to visit.    No Known Allergies Social History   Occupational History   Occupation: Retired  Tobacco Use   Smoking status: Never    Passive exposure: Never   Smokeless tobacco: Never  Vaping Use   Vaping status: Never Used  Substance and Sexual Activity   Alcohol use: Never   Drug use: Never   Sexual activity: Not Currently   Family History  Problem Relation Age of Onset   Cervical cancer Mother    Ovarian cancer Mother    Arthritis Mother    Cancer Mother    Dementia Father    Retinal detachment Father    Heart attack Father    Diabetes Father    Stroke Father    Hypertension Father    Alcohol abuse Father    Arthritis Father    Hearing loss Father    Hyperlipidemia Father    Ovarian cancer Sister    Arthritis Sister    Arthritis Sister    Arthritis Sister    Arthritis Sister    Glaucoma Paternal Grandfather    Arthritis Paternal Grandfather    Depression Paternal Grandfather    Hearing  loss Paternal Grandfather    Diabetes Paternal Grandmother    Arthritis Sister    Arthritis Sister    Cancer Sister    Diabetes Maternal Uncle    Vision loss Maternal Uncle    Immunization History  Administered Date(s) Administered   Fluad Quad(high Dose 65+) 11/20/2019, 12/16/2020   Fluad Trivalent(High Dose 65+) 11/10/2022   Hepatitis A, Ped/Adol-2 Dose 04/13/2022, 10/11/2022   Hepatitis B, PED/ADOLESCENT 04/13/2022, 05/13/2022, 10/11/2022   Influenza, High Dose Seasonal PF 01/06/2017   Influenza, Seasonal, Injecte, Preservative Fre 12/15/2007, 12/16/2008, 12/15/2009, 12/18/2010, 12/20/2011, 01/04/2013   Influenza,inj,Quad PF,6+ Mos 12/28/2013, 01/02/2015, 01/01/2016, 12/06/2017, 11/15/2018   Influenza,trivalent, recombinat, inj, PF 01/06/2017   Influenza-Unspecified 12/15/2007, 12/16/2008, 12/15/2009, 12/18/2010, 12/20/2011, 01/04/2013, 12/14/2021   PFIZER Comirnaty(Gray Top)Covid-19 Tri-Sucrose Vaccine 07/01/2020   PFIZER(Purple Top)SARS-COV-2 Vaccination 04/29/2019, 05/22/2019, 01/23/2020   PNEUMOCOCCAL CONJUGATE-20 12/31/2021   Pfizer(Comirnaty)Fall Seasonal Vaccine 12 years and older 01/01/2022, 12/16/2022   Pneumococcal Conjugate-13 03/20/2018   Pneumococcal Polysaccharide-23 02/12/2005, 07/22/2016   Respiratory Syncytial Virus Vaccine,Recomb Aduvanted(Arexvy) 12/16/2022   Rsv, Bivalent, Protein Subunit Rsvpref,pf (Abrysvo) 12/16/2022   Td 02/17/2007   Td (Adult),5 Lf Tetanus Toxid, Preservative Free 02/17/2007   Tdap 11/23/2013, 07/25/2019   Unspecified SARS-COV-2 Vaccination 12/24/2020, 01/01/2022   Zoster Recombinant(Shingrix) 07/21/2017, 11/04/2017     Review of Systems: Negative except as noted in the HPI.   Objective: There were no vitals filed for this visit.  Joyce Brown is a pleasant 73 y.o. female in NAD. AAO X 3.  Diabetic foot exam was performed with the following findings:   Vascular Examination: Capillary refill time immediate b/l. Vascular  status intact b/l with palpable pedal pulses. Pedal hair present b/l. No pain with calf compression b/l. Skin temperature gradient WNL b/l. No cyanosis or clubbing b/l. No ischemia or gangrene noted b/l.   Neurological Examination: Sensation grossly intact b/l with 10 gram monofilament. Vibratory sensation intact b/l. Pt has subjective symptoms of neuropathy.  Dermatological Examination: Pedal skin with normal turgor, texture and tone b/l.  No open wounds. No interdigital macerations.   Toenails  1-5 b/l thick, discolored, elongated with subungual debris and pain on dorsal palpation.   No corns, calluses nor porokeratotic lesions noted.  Musculoskeletal Examination: Muscle strength 5/5 to all lower extremity muscle groups bilaterally. No pain, crepitus or joint limitation noted with ROM bilateral LE. Hammertoe deformity noted 2-5 b/l. Utilizes wheelchair for mobility assistance.   Radiographs: None     Lab Results  Component Value Date   HGBA1C 9.1 (H) 04/11/2023   ADA Risk Categorization: Low Risk :  Patient has all of the following: Intact protective sensation No prior foot ulcer  No severe deformity Pedal pulses present  Assessment: 1. Pain due to onychomycosis of toenails of both feet   2. Acquired hallux valgus, unspecified laterality   3. Hammer toe, unspecified laterality   4. Diabetic polyneuropathy associated with type 1 diabetes mellitus (HCC)   5. Encounter for diabetic foot exam (HCC)     Plan: Diabetic foot examination performed today. All patient's and/or POA's questions/concerns addressed on today's visit. Toenails 1-5 debrided in length and girth without incident. Continue foot and shoe inspections daily. Monitor blood glucose per PCP/Endocrinologist's recommendations. Continue soft, supportive shoe gear daily. Report any pedal injuries to medical professional. Call office if there are any questions/concerns. -Patient/POA to call should there be question/concern  in the interim. Return in about 3 months (around 08/27/2023).  Freddie Breech, DPM      [facility] LOCATION: [address] [place] [address]                   Office [phone]   [facility] LOCATION: [address] [place], [place] [address] Office [phone]

## 2023-05-28 DIAGNOSIS — E10649 Type 1 diabetes mellitus with hypoglycemia without coma: Secondary | ICD-10-CM | POA: Diagnosis not present

## 2023-05-28 DIAGNOSIS — E876 Hypokalemia: Secondary | ICD-10-CM | POA: Diagnosis not present

## 2023-05-28 DIAGNOSIS — I10 Essential (primary) hypertension: Secondary | ICD-10-CM | POA: Diagnosis not present

## 2023-05-28 DIAGNOSIS — E1042 Type 1 diabetes mellitus with diabetic polyneuropathy: Secondary | ICD-10-CM | POA: Diagnosis not present

## 2023-05-28 DIAGNOSIS — E785 Hyperlipidemia, unspecified: Secondary | ICD-10-CM | POA: Diagnosis not present

## 2023-05-28 DIAGNOSIS — L89151 Pressure ulcer of sacral region, stage 1: Secondary | ICD-10-CM | POA: Diagnosis not present

## 2023-05-29 NOTE — Progress Notes (Unsigned)
 Established patient visit  Patient: Joyce Brown   DOB: [birth year]   73 y.o. Female  MRN: [id number] Visit Date: 05/30/2023  Today's healthcare provider: Debera Lat, PA-C   No chief complaint on file.  Subjective       Discussed the use of AI scribe software for clinical note transcription with the patient, who gave verbal consent to proceed.  History of Present Illness               05/12/2023    8:49 AM 02/09/2023    9:55 AM 01/11/2023    2:16 PM  Depression screen PHQ 2/9  Decreased Interest 0 0 0  Down, Depressed, Hopeless 0  0  PHQ - 2 Score 0 0 0  Altered sleeping 0  0  Tired, decreased energy 0  0  Change in appetite 0  0  Feeling bad or failure about yourself  0  0  Trouble concentrating 0  0  Moving slowly or fidgety/restless 0  0  Suicidal thoughts 0  0  PHQ-9 Score 0  0  Difficult doing work/chores Not difficult at all  Not difficult at all      05/12/2023    8:49 AM  GAD 7 : Generalized Anxiety Score  Nervous, Anxious, on Edge 0  Control/stop worrying 0  Worry too much - different things 0  Trouble relaxing 0  Restless 0  Easily annoyed or irritable 0  Afraid - awful might happen 0  Total GAD 7 Score 0  Anxiety Difficulty Not difficult at all    Medications: Outpatient Medications Prior to Visit  Medication Sig   Acetaminophen 500 MG capsule Take 500 mg by mouth every 6 (six) hours as needed for moderate pain.   antiseptic oral rinse (BIOTENE) LIQD 15 mLs by Mouth Rinse route in the morning, at noon, in the evening, and at bedtime.   atorvastatin (LIPITOR) 80 MG tablet Take 1 tablet (80 mg total) by mouth daily.   BD INSULIN SYRINGE U/F 1/2UNIT 31G X 5/16" 0.3 ML MISC USE 3 TIMES A DAY   carboxymethylcellulose (REFRESH PLUS) 0.5 % SOLN Place 1 drop into both eyes 3 (three) times daily as needed.   Continuous Blood Gluc Sensor (DEXCOM G7 SENSOR) MISC by Does not apply route.   docusate sodium (COLACE) 100 MG capsule Take 100 mg by  mouth daily as needed for mild constipation.   estradiol (ESTRACE) 0.1 MG/GM vaginal cream Place 1 Applicatorful vaginally 2 (two) times a week.   gabapentin (NEURONTIN) 100 MG capsule Take 2 capsules (200 mg total) by mouth 3 (three) times daily.   Glucagon (BAQSIMI TWO PACK) 3 MG/DOSE POWD Place 3 mg into the nose as needed (For hypoglycemia).   glucose blood test strip Use   insulin aspart (NOVOLOG PENFILL) cartridge USE MAX OF DAILY DOSE OF 60 UNITS PER CORRECTION SCALE AS DIRECTED (Patient taking differently: USE MAX OF DAILY DOSE OF 60 UNITS PER CORRECTION SCALE AS DIRECTED Taking 8 units before breakfast; 10 units before lunch; 7 units before dinner)   Insulin Disposable Pump (OMNIPOD 5 DEXG7G6 PODS GEN 5) MISC SMARTSIG:SUB-Q Every 3 Days   Insulin Pen Needle (DROPLET PEN NEEDLES) 31G X 5 MM MISC USE 4 TIMES DAILY AS DIRECTED BY YOUR DOCTOR   Microlet Lancets MISC 3 (three) times daily.   Multiple Vitamin (MULTI-VITAMIN) tablet Take 2 tablets by mouth daily.   Omega 3 1200 MG CAPS Take by mouth. One daily   RSV vaccine recomb adjuvanted (  AREXVY) 120 MCG/0.5ML injection Inject into the muscle.   sertraline (ZOLOFT) 100 MG tablet TAKE 1 TABLET EVERY DAY   Sodium Fluoride 1.1 % PSTE Use 1 Application Nightly after brushing/flossing. Spit out excess. No rinse/eat/drink for at least 30 minutes after application   solifenacin (VESICARE) 10 MG tablet Take 1 tablet (10 mg total) by mouth daily for 28 days.   tiZANidine (ZANAFLEX) 2 MG tablet TAKE 1 TO 2 TABLETS BY MOUTH AT BEDTIME AS NEEDED FOR MUSCLE SPASMS   No facility-administered medications prior to visit.    Review of Systems All negative Except see HPI   {Insert previous labs (optional):23779} {See past labs  Heme  Chem  Endocrine  Serology  Results Review (optional):1}   Objective    There were no vitals taken for this visit. {Insert last BP/Wt (optional):23777}{See vitals history (optional):1}   Physical Exam   No  results found for any visits on 05/30/23.      Assessment and Plan             No orders of the defined types were placed in this encounter.   No follow-ups on file.   The patient was advised to call back or seek an in-person evaluation if the symptoms worsen or if the condition fails to improve as anticipated.  I discussed the assessment and treatment plan with the patient. The patient was provided an opportunity to ask questions and all were answered. The patient agreed with the plan and demonstrated an understanding of the instructions.  I, Debera Lat, PA-C have reviewed all documentation for this visit. The documentation on 05/30/2023  for the exam, diagnosis, procedures, and orders are all accurate and complete.  Debera Lat, [facility], MMS [facility] [phone] (phone) [phone] (fax)  [facility]

## 2023-05-30 ENCOUNTER — Encounter: Payer: Self-pay | Admitting: Physician Assistant

## 2023-05-30 ENCOUNTER — Ambulatory Visit (INDEPENDENT_AMBULATORY_CARE_PROVIDER_SITE_OTHER): Admitting: Physician Assistant

## 2023-05-30 VITALS — BP 128/69 | HR 59 | Resp 16 | Ht 70.0 in | Wt 169.0 lb

## 2023-05-30 DIAGNOSIS — Z7409 Other reduced mobility: Secondary | ICD-10-CM | POA: Diagnosis not present

## 2023-05-30 DIAGNOSIS — Z789 Other specified health status: Secondary | ICD-10-CM

## 2023-05-30 DIAGNOSIS — I700 Atherosclerosis of aorta: Secondary | ICD-10-CM

## 2023-05-30 DIAGNOSIS — G119 Hereditary ataxia, unspecified: Secondary | ICD-10-CM

## 2023-05-30 DIAGNOSIS — E785 Hyperlipidemia, unspecified: Secondary | ICD-10-CM

## 2023-05-30 DIAGNOSIS — I10 Essential (primary) hypertension: Secondary | ICD-10-CM

## 2023-05-30 DIAGNOSIS — E1042 Type 1 diabetes mellitus with diabetic polyneuropathy: Secondary | ICD-10-CM

## 2023-05-30 DIAGNOSIS — H532 Diplopia: Secondary | ICD-10-CM | POA: Diagnosis not present

## 2023-05-30 DIAGNOSIS — Z993 Dependence on wheelchair: Secondary | ICD-10-CM | POA: Diagnosis not present

## 2023-05-30 DIAGNOSIS — E10649 Type 1 diabetes mellitus with hypoglycemia without coma: Secondary | ICD-10-CM | POA: Diagnosis not present

## 2023-05-30 DIAGNOSIS — E876 Hypokalemia: Secondary | ICD-10-CM | POA: Diagnosis not present

## 2023-05-30 DIAGNOSIS — L89151 Pressure ulcer of sacral region, stage 1: Secondary | ICD-10-CM | POA: Diagnosis not present

## 2023-05-31 DIAGNOSIS — I10 Essential (primary) hypertension: Secondary | ICD-10-CM | POA: Diagnosis not present

## 2023-05-31 DIAGNOSIS — E876 Hypokalemia: Secondary | ICD-10-CM | POA: Diagnosis not present

## 2023-05-31 DIAGNOSIS — E785 Hyperlipidemia, unspecified: Secondary | ICD-10-CM | POA: Diagnosis not present

## 2023-05-31 DIAGNOSIS — E10649 Type 1 diabetes mellitus with hypoglycemia without coma: Secondary | ICD-10-CM | POA: Diagnosis not present

## 2023-05-31 DIAGNOSIS — L89151 Pressure ulcer of sacral region, stage 1: Secondary | ICD-10-CM | POA: Diagnosis not present

## 2023-05-31 DIAGNOSIS — E1042 Type 1 diabetes mellitus with diabetic polyneuropathy: Secondary | ICD-10-CM | POA: Diagnosis not present

## 2023-06-01 DIAGNOSIS — H532 Diplopia: Secondary | ICD-10-CM | POA: Insufficient documentation

## 2023-06-03 ENCOUNTER — Encounter: Payer: Self-pay | Admitting: Podiatry

## 2023-06-04 ENCOUNTER — Emergency Department

## 2023-06-04 ENCOUNTER — Emergency Department
Admission: EM | Admit: 2023-06-04 | Discharge: 2023-06-05 | Disposition: A | Discharge status: Home / Self Care | Attending: Emergency Medicine | Admitting: Emergency Medicine

## 2023-06-04 ENCOUNTER — Other Ambulatory Visit: Payer: Self-pay | Admitting: Family Medicine

## 2023-06-04 ENCOUNTER — Other Ambulatory Visit: Payer: Self-pay

## 2023-06-04 DIAGNOSIS — W19XXXA Unspecified fall, initial encounter: Secondary | ICD-10-CM | POA: Insufficient documentation

## 2023-06-04 DIAGNOSIS — E1042 Type 1 diabetes mellitus with diabetic polyneuropathy: Secondary | ICD-10-CM

## 2023-06-04 DIAGNOSIS — S92345A Nondisplaced fracture of fourth metatarsal bone, left foot, initial encounter for closed fracture: Secondary | ICD-10-CM | POA: Diagnosis not present

## 2023-06-04 DIAGNOSIS — M79673 Pain in unspecified foot: Secondary | ICD-10-CM | POA: Diagnosis not present

## 2023-06-04 DIAGNOSIS — S92302A Fracture of unspecified metatarsal bone(s), left foot, initial encounter for closed fracture: Secondary | ICD-10-CM

## 2023-06-04 DIAGNOSIS — S92325A Nondisplaced fracture of second metatarsal bone, left foot, initial encounter for closed fracture: Secondary | ICD-10-CM | POA: Diagnosis not present

## 2023-06-04 DIAGNOSIS — S99922A Unspecified injury of left foot, initial encounter: Secondary | ICD-10-CM | POA: Diagnosis present

## 2023-06-04 DIAGNOSIS — S92335A Nondisplaced fracture of third metatarsal bone, left foot, initial encounter for closed fracture: Secondary | ICD-10-CM | POA: Insufficient documentation

## 2023-06-04 DIAGNOSIS — M85872 Other specified disorders of bone density and structure, left ankle and foot: Secondary | ICD-10-CM | POA: Diagnosis not present

## 2023-06-04 DIAGNOSIS — E109 Type 1 diabetes mellitus without complications: Secondary | ICD-10-CM | POA: Insufficient documentation

## 2023-06-04 MED ORDER — HYDROCODONE-ACETAMINOPHEN 5-325 MG PO TABS
0.5000 | ORAL_TABLET | Freq: Once | ORAL | Status: AC
  Administered 2023-06-05: 0.5 via ORAL
  Filled 2023-06-04: qty 1

## 2023-06-04 NOTE — ED Provider Notes (Signed)
 [facility] Provider Note    Event Date/Time   First MD Initiated Contact with Patient 06/04/23 2335     (approximate)   History   Fall   HPI  Joyce Brown is a 73 year old female with history of T1DM presenting to the emergency department for evaluation after a fall.  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  She was transferring from her recliner to a wheelchair when her foot got caught and twisted causing her to fall to the ground.  Did not hit her head.  No LOC.  Has had pain over her left foot since that time.  She was able to call for help from her cell phone.  Has not attempted to bear weight since that time.  Denies numbness or tingling.     Physical Exam   Triage Vital Signs: ED Triage Vitals  Encounter Vitals Group     BP 06/04/23 2233 (!) 109/56     Systolic BP Percentile --      Diastolic BP Percentile --      Pulse Rate 06/04/23 2233 68     Resp 06/04/23 2233 18     Temp 06/04/23 2233 (!) 97.4 F (36.3 C)     Temp Source 06/04/23 2213 Oral     SpO2 06/04/23 2233 100 %     Weight 06/04/23 2213 169 lb 12.1 oz (77 kg)     Height 06/04/23 2213 5\' 10"  (1.778 m)     Head Circumference --      Peak Flow --      Pain Score 06/04/23 2213 10     Pain Loc --      Pain Education --      Exclude from Growth Chart --     Most recent vital signs: Vitals:   06/04/23 2233  BP: (!) 109/56  Pulse: 68  Resp: 18  Temp: (!) 97.4 F (36.3 C)  SpO2: 100%   Nursing notes and vital signs reviewed.  General: Adult female, laying in bed, awake and interactive Head: Atraumatic Chest: Symmetric chest rise, no tenderness to palpation.  Cardiac: Regular rhythm and rate.  Respiratory: Lungs clear to auscultation Abdomen: Soft, nondistended. No tenderness to palpation.  Pelvis: Stable in AP and lateral compression. No tenderness to palpation. MSK: No deformity to bilateral upper and lower extremity.   Swelling of the left foot with faint ecchymosis along the dorsal surface of the midfoot and associated pain.  Palpable DP pulse confirmed with Doppler. Neuro: Alert, oriented. GCS 15. Normal sensation to light touch in bilateral upper and lower extremity. Skin: No evidence of burns or lacerations.  ED Results / Procedures / Treatments   Labs (all labs ordered are listed, but only abnormal results are displayed) Labs Reviewed - No data to display   EKG EKG independently reviewed interpreted by myself (ER attending) demonstrates:    RADIOLOGY Imaging independently reviewed and interpreted by myself demonstrates:  X-Elana Jian demonstrates nondisplaced fractures at the base of the second, third, and fourth metatarsals with associated soft tissue swelling  PROCEDURES:  Critical Care performed: No  Procedures   MEDICATIONS ORDERED IN ED: Medications  HYDROcodone-acetaminophen (NORCO/VICODIN) 5-325 MG per tablet 0.5 tablet (has no administration in time range)     IMPRESSION / MDM / ASSESSMENT AND PLAN / ED COURSE  I reviewed the triage vital signs and the nursing notes.  Differential diagnosis includes, but is not limited to, fracture, dislocation, Lisfranc  injury, soft tissue injury  Patient's presentation is most consistent with acute complicated illness / injury requiring diagnostic workup.  73 year old female presenting to the emergency department for evaluation after a fall with foot pain.  X-Londen Lorge demonstrates nondisplaced fractures of the second, third, and fourth metatarsals.  Fortunately without evidence of neurovascular compromise.  Discussed follow-up with podiatry for further evaluation.  Patient already sees podiatry for her type 1 diabetes and is comfortable with this plan.  She was given a small dose of pain medication here, but declined to be discharged with pain medication reported she prefers to take Tylenol.  Discussed immobilization in a hard sole shoe versus posterior  splint and patient did prefer hard sole shoe.  She is minimally ambulatory at baseline, so does have adequate support equipment in her home to get around with her injury.  Strict return precautions were provided.  Patient was discharged stable condition.     FINAL CLINICAL IMPRESSION(S) / ED DIAGNOSES   Final diagnoses:  Closed nondisplaced fracture of metatarsal bone of left foot, unspecified metatarsal, initial encounter     Rx / DC Orders   ED Discharge Orders     None        Note:  This document was prepared using Dragon voice recognition software and may include unintentional dictation errors.   Trinna Post, MD 06/05/23 [phone]

## 2023-06-04 NOTE — ED Provider Notes (Incomplete)
 [facility] Provider Note    Event Date/Time   First MD Initiated Contact with Patient 06/04/23 2335     (approximate)   History   Fall   HPI  Joyce Brown is a 73 year old female with history of T1DM presenting to the emergency department for evaluation after a fall.  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  She was transferring from her recliner to a wheelchair when her foot got caught and twisted causing her to fall to the ground.  Did not hit her head.  No LOC.  Has had pain over her left foot since that time.  She was able to call for help from her cell phone.  Has not attempted to bear weight since that time.  Denies numbness or tingling.     Physical Exam   Triage Vital Signs: ED Triage Vitals  Encounter Vitals Group     BP 06/04/23 2233 (!) 109/56     Systolic BP Percentile --      Diastolic BP Percentile --      Pulse Rate 06/04/23 2233 68     Resp 06/04/23 2233 18     Temp 06/04/23 2233 (!) 97.4 F (36.3 C)     Temp Source 06/04/23 2213 Oral     SpO2 06/04/23 2233 100 %     Weight 06/04/23 2213 169 lb 12.1 oz (77 kg)     Height 06/04/23 2213 5\' 10"  (1.778 m)     Head Circumference --      Peak Flow --      Pain Score 06/04/23 2213 10     Pain Loc --      Pain Education --      Exclude from Growth Chart --     Most recent vital signs: Vitals:   06/04/23 2233  BP: (!) 109/56  Pulse: 68  Resp: 18  Temp: (!) 97.4 F (36.3 C)  SpO2: 100%   Nursing notes and vital signs reviewed.  General: Adult female, laying in bed, awake and interactive Head: Atraumatic Chest: Symmetric chest rise, no tenderness to palpation.  Cardiac: Regular rhythm and rate.  Respiratory: Lungs clear to auscultation Abdomen: Soft, nondistended. No tenderness to palpation.  Pelvis: Stable in AP and lateral compression. No tenderness to palpation. MSK: No deformity to bilateral upper and lower extremity.   Swelling of the left foot with faint ecchymosis along the dorsal surface of the midfoot and associated pain.  Palpable DP pulse confirmed with Doppler. Neuro: Alert, oriented. GCS 15. Normal sensation to light touch in bilateral upper and lower extremity. Skin: No evidence of burns or lacerations.  ED Results / Procedures / Treatments   Labs (all labs ordered are listed, but only abnormal results are displayed) Labs Reviewed - No data to display   EKG EKG independently reviewed interpreted by myself (ER attending) demonstrates:    RADIOLOGY Imaging independently reviewed and interpreted by myself demonstrates:  X-Aliyana Dlugosz demonstrates nondisplaced fractures at the base of the second, third, and fourth metatarsals with associated soft tissue swelling  PROCEDURES:  Critical Care performed: No  Procedures   MEDICATIONS ORDERED IN ED: Medications  HYDROcodone-acetaminophen (NORCO/VICODIN) 5-325 MG per tablet 0.5 tablet (has no administration in time range)     IMPRESSION / MDM / ASSESSMENT AND PLAN / ED COURSE  I reviewed the triage vital signs and the nursing notes.  Differential diagnosis includes, but is not limited to, fracture, dislocation, Lisfranc  injury, soft tissue injury  Patient's presentation is most consistent with acute complicated illness / injury requiring diagnostic workup.  73 year old female presenting to the emergency department for evaluation of foot pain.      FINAL CLINICAL IMPRESSION(S) / ED DIAGNOSES   Final diagnoses:  None     Rx / DC Orders   ED Discharge Orders     None        Note:  This document was prepared using Dragon voice recognition software and may include unintentional dictation errors.

## 2023-06-04 NOTE — ED Triage Notes (Signed)
 Pt to ED by EMS from home following a mechanical fall. Pt states she was transferring from her recliner to her wheelchair when she got "tangled up and fell". Pt c/o L foot pain, swelling is noted. Denies any LOC or thinners. Arrives A+O, VSS, NADN.

## 2023-06-05 DIAGNOSIS — S92325A Nondisplaced fracture of second metatarsal bone, left foot, initial encounter for closed fracture: Secondary | ICD-10-CM | POA: Diagnosis not present

## 2023-06-05 MED ORDER — ACETAMINOPHEN 500 MG PO TABS
ORAL_TABLET | ORAL | Status: AC
  Filled 2023-06-05: qty 2

## 2023-06-05 MED ORDER — ACETAMINOPHEN 325 MG PO TABS
ORAL_TABLET | ORAL | Status: AC
  Administered 2023-06-05: 650 mg via ORAL
  Filled 2023-06-05: qty 2

## 2023-06-05 MED ORDER — ACETAMINOPHEN 325 MG PO TABS: 650.0000 mg | ORAL_TABLET | Freq: Once | ORAL | Status: AC

## 2023-06-05 MED ORDER — ACETAMINOPHEN 325 MG PO TABS
650.0000 mg | ORAL_TABLET | Freq: Once | ORAL | Status: AC
  Administered 2023-06-05: 650 mg via ORAL
  Filled 2023-06-05: qty 2

## 2023-06-05 NOTE — ED Notes (Signed)
 Pt updated on [place] ED staff continuing to provide transport for pt who is wheel chair bound and independent pt remains aox4 speaking in full clear sentences provided orange juice , apple sauce, pears , and graham crackers , pt previously provided pillows and blankets for comfort pt denies further needs @ this time

## 2023-06-05 NOTE — ED Notes (Signed)
 [facility] arrives for pt transport and stands near pt stretcher declining to transport pt reporting requires signature of transfer of care upon arrival to pt home pt currently aox4 lives alone / independently Naval architect decline to transport pt and Quarry manager speaks with Animal nutritionist

## 2023-06-05 NOTE — ED Notes (Signed)
 Called [facility] spoke with rep Onalee Hua for a follow- up on the pt. transport, rep. stated he will send the truck back for the pt after they are done with transport to [facility].

## 2023-06-05 NOTE — ED Notes (Signed)
 Patient went home by cab [facility] [place]

## 2023-06-05 NOTE — ED Notes (Signed)
 Pt non ambulatory fall from attempt to stand this date resulting in injury to left foot foot appears slightly bruised and swollen pt reports pain in foot increases with weight bearing

## 2023-06-05 NOTE — ED Notes (Signed)
 [facility] to arrive to transport pt to home pt appears to be resting quietly in hall 5

## 2023-06-05 NOTE — ED Notes (Signed)
 [facility] staff decline pt transport stating cannot transport pt without someone @ residence to sign for pt care staff is notified pt is independent Naval architect states it is not legal for them to transport any pt without having someone other than pt sign for their care @ destination [place] staff departs [facility] ED and does not transport pt

## 2023-06-05 NOTE — ED Notes (Signed)
 [facility] called spoke with Joyce Brown to transport patient to her home [address] / he said it would be in the early day shift before patient can be picked up

## 2023-06-05 NOTE — Discharge Instructions (Signed)
 You were seen in the emergency department today for evaluation after fall.  Your x-Anjalina Bergevin did show you have fractures of your 2nd through 4th metatarsal bones in your foot.  Please follow-up with podiatrist for further evaluation of this.  I included the contact information for [facility] in your paperwork.  Use the hard soled shoe that you were given to protect your foot.  Avoid putting any weight on your foot until you have been cleared by podiatry.  Return to the ER for any new or worsening symptoms.

## 2023-06-05 NOTE — ED Notes (Signed)
 The crew chief called back and stated they will not transport the pt., because the pt can sit in a wheelchair.

## 2023-06-06 ENCOUNTER — Other Ambulatory Visit: Payer: Medicare Other

## 2023-06-06 ENCOUNTER — Telehealth: Payer: Self-pay

## 2023-06-06 DIAGNOSIS — I10 Essential (primary) hypertension: Secondary | ICD-10-CM | POA: Diagnosis not present

## 2023-06-06 DIAGNOSIS — E785 Hyperlipidemia, unspecified: Secondary | ICD-10-CM | POA: Diagnosis not present

## 2023-06-06 DIAGNOSIS — E876 Hypokalemia: Secondary | ICD-10-CM | POA: Diagnosis not present

## 2023-06-06 DIAGNOSIS — L89151 Pressure ulcer of sacral region, stage 1: Secondary | ICD-10-CM | POA: Diagnosis not present

## 2023-06-06 DIAGNOSIS — E1042 Type 1 diabetes mellitus with diabetic polyneuropathy: Secondary | ICD-10-CM | POA: Diagnosis not present

## 2023-06-06 DIAGNOSIS — E10649 Type 1 diabetes mellitus with hypoglycemia without coma: Secondary | ICD-10-CM | POA: Diagnosis not present

## 2023-06-06 NOTE — Telephone Encounter (Signed)
 Patient was identified as falling into the [place] Measure - Diabetes.   Patient was: Appointment scheduled with primary care provider in the next 30 days.

## 2023-06-07 ENCOUNTER — Ambulatory Visit (INDEPENDENT_AMBULATORY_CARE_PROVIDER_SITE_OTHER): Admitting: Podiatry

## 2023-06-07 ENCOUNTER — Encounter: Payer: Self-pay | Admitting: Podiatry

## 2023-06-07 DIAGNOSIS — S92302A Fracture of unspecified metatarsal bone(s), left foot, initial encounter for closed fracture: Secondary | ICD-10-CM | POA: Diagnosis not present

## 2023-06-07 NOTE — Progress Notes (Signed)
 Subjective:  Patient ID: Joyce Brown, female    DOB: 1951-01-06,  MRN: [id number]  Chief Complaint  Patient presents with   Foot Pain    Left foot fracture, pt stated that she went to the ED on 06/04/23    73 y.o. female presents with the above complaint.  Patient presents with left third fourth and second metatarsal minimally displaced fracture.  She states she tripped and fell 1 to the emergency room had x-rays taken she is here to get it evaluated discussed options has not seen MRIs prior to seeing me denies any other acute complaints.  Pain scale is 5 out of 10 dull aching nature   Review of Systems: Negative except as noted in the HPI. Denies N/V/F/Ch.  Past Medical History:  Diagnosis Date   Arthritis 1995   Have family history of it.   Asthma 1995   Has resolved   Cataract 2017   Family History of them   Cerebellar ataxia (HCC)    Depression 1996   Due to Fibromyalgia.   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   Glaucoma Various   Family History of it.   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    Current Outpatient Medications:    Acetaminophen 500 MG capsule, Take 500 mg by mouth every 6 (six) hours as needed for moderate pain., Disp: , Rfl:    antiseptic oral rinse (BIOTENE) LIQD, 15 mLs by Mouth Rinse route in the morning, at noon, in the  evening, and at bedtime., Disp: , Rfl:    atorvastatin (LIPITOR) 80 MG tablet, TAKE 1 TABLET (80 MG TOTAL) BY MOUTH DAILY., Disp: 90 tablet, Rfl: 3   BD INSULIN SYRINGE U/F 1/2UNIT 31G X 5/16" 0.3 ML MISC, USE 3 TIMES A DAY, Disp: 100 each, Rfl: 3   carboxymethylcellulose (REFRESH PLUS) 0.5 % SOLN, Place 1 drop into both eyes 3 (three) times daily as needed., Disp: , Rfl:    Continuous Blood Gluc Sensor (DEXCOM G7 SENSOR) MISC, by Does not apply route., Disp: , Rfl:    docusate sodium (COLACE) 100 MG capsule, Take 100 mg by mouth daily as needed for mild constipation., Disp: , Rfl:    estradiol (ESTRACE) 0.1 MG/GM vaginal cream, Place 1 Applicatorful vaginally 2 (two) times a week., Disp: , Rfl:    gabapentin (NEURONTIN) 100 MG capsule, Take 2 capsules (200 mg total) by mouth 3 (three) times daily., Disp: , Rfl:    Glucagon (BAQSIMI TWO PACK) 3 MG/DOSE POWD, Place 3 mg into the nose as needed (For hypoglycemia)., Disp: 1 each, Rfl: 2   glucose blood test strip, Use, Disp: , Rfl:    insulin aspart (NOVOLOG PENFILL) cartridge, USE MAX OF DAILY DOSE OF 60 UNITS PER CORRECTION SCALE AS DIRECTED (Patient taking differently: USE MAX OF DAILY DOSE OF 60 UNITS PER  CORRECTION SCALE AS DIRECTED Taking 8 units before breakfast; 10 units before lunch; 7 units before dinner), Disp: 60 mL, Rfl: 2   Insulin Disposable Pump (OMNIPOD 5 DEXG7G6 PODS GEN 5) MISC, SMARTSIG:SUB-Q Every 3 Days, Disp: , Rfl:    Insulin Pen Needle (DROPLET PEN NEEDLES) 31G X 5 MM MISC, USE 4 TIMES DAILY AS DIRECTED BY YOUR DOCTOR, Disp: 400 each, Rfl: 3   Microlet Lancets MISC, 3 (three) times daily., Disp: , Rfl:    Multiple Vitamin (MULTI-VITAMIN) tablet, Take 2 tablets by mouth daily., Disp: , Rfl:    Omega 3 1200 MG CAPS, Take by mouth. One daily, Disp: , Rfl:    RSV vaccine recomb adjuvanted (AREXVY) 120 MCG/0.5ML injection, Inject into the muscle., Disp: 0.5 mL, Rfl: 0   sertraline (ZOLOFT) 100 MG tablet, TAKE 1 TABLET EVERY DAY,  Disp: 90 tablet, Rfl: 3   Sodium Fluoride 1.1 % PSTE, Use 1 Application Nightly after brushing/flossing. Spit out excess. No rinse/eat/drink for at least 30 minutes after application, Disp: 100 mL, Rfl: 0   solifenacin (VESICARE) 10 MG tablet, Take 1 tablet (10 mg total) by mouth daily for 28 days., Disp: 90 tablet, Rfl: 2   tiZANidine (ZANAFLEX) 2 MG tablet, TAKE 1 TO 2 TABLETS BY MOUTH AT BEDTIME AS NEEDED FOR MUSCLE SPASMS, Disp: 360 tablet, Rfl: 2  Social History   Tobacco Use  Smoking Status Never   Passive exposure: Never  Smokeless Tobacco Never    No Known Allergies Objective:  There were no vitals filed for this visit. There is no height or weight on file to calculate BMI. Constitutional Well developed. Well nourished.  Vascular Dorsalis pedis pulses palpable bilaterally. Posterior tibial pulses palpable bilaterally. Capillary refill normal to all digits.  No cyanosis or clubbing noted. Pedal hair growth normal.  Neurologic Normal speech. Oriented to person, place, and time. Epicritic sensation to light touch grossly present bilaterally.  Dermatologic Nails well groomed and normal in appearance. No open wounds. No skin lesions.  Orthopedic: Pain on palpation across the second third and fourth metatarsal base.  Ecchymosis noted bruising noted palpable pulses no other abnormalities identified   Radiographs: Fractures at the base of the second, third and fourth metatarsals.   Assessment:  No diagnosis found. Plan:  Patient was evaluated and treated and all questions answered.  Left second third and fourth metatarsal fracture minimally displaced -All questions and concerns were discussed with the patient in extensive detail -I encouraged her to continue aggressively elevate.  She can walk with surgical shoe.  She does not do much walking and encouraged her to rest and elevate I discussed with her we will take 6 to 8 weeks to completely heal.  She states  understanding  No follow-ups on file.

## 2023-06-09 DIAGNOSIS — E10649 Type 1 diabetes mellitus with hypoglycemia without coma: Secondary | ICD-10-CM | POA: Diagnosis not present

## 2023-06-09 DIAGNOSIS — E876 Hypokalemia: Secondary | ICD-10-CM | POA: Diagnosis not present

## 2023-06-09 DIAGNOSIS — E1042 Type 1 diabetes mellitus with diabetic polyneuropathy: Secondary | ICD-10-CM | POA: Diagnosis not present

## 2023-06-09 DIAGNOSIS — L89151 Pressure ulcer of sacral region, stage 1: Secondary | ICD-10-CM | POA: Diagnosis not present

## 2023-06-09 DIAGNOSIS — I10 Essential (primary) hypertension: Secondary | ICD-10-CM | POA: Diagnosis not present

## 2023-06-09 DIAGNOSIS — E785 Hyperlipidemia, unspecified: Secondary | ICD-10-CM | POA: Diagnosis not present

## 2023-06-13 ENCOUNTER — Encounter: Payer: Self-pay | Admitting: Family Medicine

## 2023-06-13 ENCOUNTER — Ambulatory Visit (INDEPENDENT_AMBULATORY_CARE_PROVIDER_SITE_OTHER): Admitting: Family Medicine

## 2023-06-13 VITALS — BP 132/67 | HR 63 | Ht 70.0 in | Wt 169.0 lb

## 2023-06-13 DIAGNOSIS — E1042 Type 1 diabetes mellitus with diabetic polyneuropathy: Secondary | ICD-10-CM | POA: Diagnosis not present

## 2023-06-13 DIAGNOSIS — G119 Hereditary ataxia, unspecified: Secondary | ICD-10-CM | POA: Diagnosis not present

## 2023-06-13 DIAGNOSIS — Z789 Other specified health status: Secondary | ICD-10-CM | POA: Diagnosis not present

## 2023-06-13 DIAGNOSIS — I10 Essential (primary) hypertension: Secondary | ICD-10-CM

## 2023-06-13 DIAGNOSIS — M47812 Spondylosis without myelopathy or radiculopathy, cervical region: Secondary | ICD-10-CM | POA: Diagnosis not present

## 2023-06-13 DIAGNOSIS — N3941 Urge incontinence: Secondary | ICD-10-CM

## 2023-06-13 DIAGNOSIS — Z993 Dependence on wheelchair: Secondary | ICD-10-CM | POA: Diagnosis not present

## 2023-06-13 DIAGNOSIS — M47816 Spondylosis without myelopathy or radiculopathy, lumbar region: Secondary | ICD-10-CM

## 2023-06-13 DIAGNOSIS — M48061 Spinal stenosis, lumbar region without neurogenic claudication: Secondary | ICD-10-CM | POA: Diagnosis not present

## 2023-06-13 DIAGNOSIS — Z7409 Other reduced mobility: Secondary | ICD-10-CM

## 2023-06-13 DIAGNOSIS — L89151 Pressure ulcer of sacral region, stage 1: Secondary | ICD-10-CM | POA: Diagnosis not present

## 2023-06-13 DIAGNOSIS — L609 Nail disorder, unspecified: Secondary | ICD-10-CM | POA: Diagnosis not present

## 2023-06-13 DIAGNOSIS — E876 Hypokalemia: Secondary | ICD-10-CM | POA: Diagnosis not present

## 2023-06-13 DIAGNOSIS — E785 Hyperlipidemia, unspecified: Secondary | ICD-10-CM | POA: Diagnosis not present

## 2023-06-13 DIAGNOSIS — E10649 Type 1 diabetes mellitus with hypoglycemia without coma: Secondary | ICD-10-CM | POA: Diagnosis not present

## 2023-06-13 NOTE — Progress Notes (Signed)
 Established patient visit   Patient: Joyce Brown   DOB: 1950-04-17   73 y.o. Female  MRN: [id number] Visit Date: 06/13/2023  Today's healthcare provider: Ronnald Ramp, MD   Chief Complaint  Patient presents with   Hypertension    No concerns she had her Bp taking this morning it was 120/80   Subjective     HPI     Hypertension    Additional comments: No concerns she had her Bp taking this morning it was 120/80      Last edited by Thedora Hinders, CMA on 06/13/2023 11:07 AM.       Discussed the use of AI scribe software for clinical note transcription with the patient, who gave verbal consent to proceed.  History of Present Illness Joyce Brown is a 73 year old female with type 1 diabetes who presents for a chronic follow-up.  She sustained fractures of the second, third, and fourth metatarsals after her foot folded on a rug two weeks ago. She is currently using a boot for stabilization and has been recertified for home health. She uses a wheelchair for mobility and sleeps in a recliner to avoid falling from her bed.  She has type 1 diabetes managed with a Novolog Omnipod system. Her last A1c was 9, which is higher than her previous range of 6.5-6.7. She has obtained new sensors for her pump through a different company.  She has a history of hypertension with a recent blood pressure reading of 132/67. She notes a previous high reading of 170/98 but states her blood pressure has since stabilized. No headaches, vision changes, or other symptoms related to her blood pressure.  She is concerned about her living situation as her sisters suggest she may need more supervision due to recent falls & complications including confusion after her blood glucose was abnormal due to her type 1 DM. She is considering assisted living options but is concerned about the cost.  She has a fingernail issue on her left index finger that has not changed much and is  considering seeing a dermatologist.     Past Medical History:  Diagnosis Date   Arthritis 1995   Have family history of it.   Asthma 1995   Has resolved   Cataract 2017   Family History of them   Cerebellar ataxia (HCC)    Depression 1996   Due to Fibromyalgia.   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   Glaucoma Various   Family History of it.   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   Cyanocobalamin (VITAMIN B-12 PO) Take by mouth.   Ferrous Sulfate (IRON PO) Take by mouth.   Acetaminophen  500 MG capsule Take 500 mg by mouth every 6 (six) hours as needed for moderate pain.   antiseptic oral rinse (BIOTENE) LIQD 15 mLs by Mouth Rinse route in the morning, at noon, in the evening, and at bedtime.   atorvastatin (LIPITOR) 80 MG tablet TAKE 1 TABLET (80 MG TOTAL) BY MOUTH DAILY.   BD INSULIN SYRINGE U/F 1/2UNIT 31G X 5/16" 0.3 ML MISC USE 3 TIMES A DAY   carboxymethylcellulose (REFRESH PLUS) 0.5 % SOLN Place 1 drop into both eyes 3 (three) times daily as needed.   Continuous Blood Gluc Sensor (DEXCOM G7 SENSOR) MISC by Does not apply route.   docusate sodium (COLACE) 100 MG capsule Take 100 mg by mouth daily as needed for mild  constipation.   estradiol (ESTRACE) 0.1 MG/GM vaginal cream Place 1 Applicatorful vaginally 2 (two) times a week.   gabapentin (NEURONTIN) 100 MG capsule Take 2 capsules (200 mg total) by mouth 3 (three) times daily.   Glucagon (BAQSIMI TWO PACK) 3 MG/DOSE POWD Place 3 mg into the nose as needed (For hypoglycemia).   glucose blood test strip Use   insulin aspart (NOVOLOG PENFILL) cartridge USE MAX OF DAILY DOSE OF 60 UNITS PER CORRECTION SCALE AS DIRECTED (Patient taking differently: USE MAX OF DAILY DOSE OF 60 UNITS PER CORRECTION SCALE AS DIRECTED Taking 8 units before breakfast; 10 units before lunch; 7 units before dinner)   Insulin Disposable Pump (OMNIPOD 5 DEXG7G6 PODS GEN 5) MISC SMARTSIG:SUB-Q Every 3 Days   Insulin Pen Needle (DROPLET PEN NEEDLES) 31G X 5 MM MISC USE 4 TIMES DAILY AS DIRECTED BY YOUR DOCTOR   Microlet Lancets MISC 3 (three) times daily.   Multiple Vitamin (MULTI-VITAMIN) tablet Take 2 tablets by mouth daily.   Omega 3 1200 MG CAPS Take by mouth. One daily   RSV vaccine recomb adjuvanted (AREXVY) 120 MCG/0.5ML injection Inject into the muscle. (Patient not taking: Reported on 06/13/2023)   sertraline (ZOLOFT) 100 MG tablet TAKE 1 TABLET EVERY DAY   Sodium Fluoride 1.1 % PSTE Use 1 Application Nightly after brushing/flossing. Spit out excess. No rinse/eat/drink for at least 30 minutes after application   tiZANidine (ZANAFLEX) 2 MG tablet TAKE 1 TO 2 TABLETS BY MOUTH AT BEDTIME AS NEEDED FOR MUSCLE SPASMS   No facility-administered medications prior to visit.    Review of Systems  Last CBC Lab Results  Component Value Date   WBC 8.2 04/11/2023   HGB 11.2 (L) 04/11/2023   HCT 34.2 (L) 04/11/2023   MCV 94.5 04/11/2023   MCH 30.9 04/11/2023   RDW 13.3 04/11/2023   PLT 203 04/11/2023   Last metabolic panel Lab Results  Component Value Date   GLUCOSE 237 (H) 04/11/2023   NA 137 04/11/2023   K 4.3 04/11/2023   CL 105 04/11/2023   CO2 23 04/11/2023   BUN 19  04/11/2023   CREATININE 0.93 04/11/2023   GFRNONAA >60 04/11/2023   CALCIUM 8.9 04/11/2023   PHOS 3.5 04/10/2023   PROT 6.8 04/10/2023   ALBUMIN 3.9 04/10/2023   LABGLOB 2.0 06/09/2022   AGRATIO 2.1 06/09/2022   BILITOT 0.4 04/10/2023   ALKPHOS 68 04/10/2023   AST 35 04/10/2023   ALT 48 (H) 04/10/2023   ANIONGAP 9 04/11/2023        Objective    BP 132/67   Pulse 63   Ht 5\' 10"  (1.778 m)   Wt 169 lb (76.7 kg)   SpO2 99%   BMI 24.25 kg/m  BP Readings from Last 3 Encounters:  06/13/23 132/67  06/04/23 (!) 109/56  05/30/23 128/69   Wt Readings from Last 3 Encounters:  06/13/23 169 lb (76.7 kg)  06/04/23 169 lb 12.1 oz (77 kg)  05/30/23 169 lb (76.7 kg)        Physical Exam Vitals reviewed.  Constitutional:      General: She is not in acute distress.    Appearance: Normal appearance. She is not ill-appearing, toxic-appearing or diaphoretic.     Comments: Elderly female in NAD, seated in wheel chair   Eyes:     Conjunctiva/sclera: Conjunctivae normal.  Cardiovascular:     Rate and Rhythm: Normal rate and regular rhythm.     Pulses: Normal pulses.     Heart sounds: Normal heart sounds. No murmur heard.    No friction rub. No gallop.  Pulmonary:     Effort: Pulmonary effort is normal. No respiratory distress.     Breath sounds: Normal breath sounds. No stridor. No wheezing, rhonchi or rales.  Abdominal:     General: Bowel sounds are normal. There is no distension.     Palpations: Abdomen is soft.     Tenderness: There is no abdominal tenderness.  Musculoskeletal:     Right lower leg: No edema.     Left lower leg: No edema.  Skin:    Findings: No erythema or rash.  Neurological:     Mental Status: She is alert and oriented to person, place, and time.  Psychiatric:        Mood and Affect: Mood and affect normal.        Speech: Speech normal.        Behavior: Behavior normal. Behavior is cooperative.       No results found for any visits on 06/13/23.   Assessment & Plan     Problem List Items Addressed This Visit       Cardiovascular and Mediastinum   HTN (hypertension) (Chronic)     Endocrine   Diabetic neuropathy (HCC)   Relevant Orders   AMB Referral [facility] Care Management     Nervous and Auditory   Cerebellar ataxia (HCC) (Chronic)   Relevant Orders   AMB Referral [facility] Care Management     Musculoskeletal and Integument   Spondylosis without myelopathy or radiculopathy, lumbar region (Chronic)   Relevant Orders   AMB Referral [facility] Care Management   Spondylosis of cervical spine (Chronic)   Relevant Orders   AMB Referral [facility] Care Management     Other   Wheelchair dependence (Chronic)   Relevant Orders   AMB Referral [facility] Care Management   Urge incontinence of urine (Chronic)   Relevant Orders   AMB Referral [facility] Care Management   Spinal stenosis of lumbar region   Relevant Orders   AMB Referral [facility] Care Management   Impaired mobility and activities of daily living - Primary   Relevant Orders   AMB Referral [facility] Care Management   Other Visit Diagnoses       Fingernail problem       Relevant Orders   Ambulatory referral to Dermatology        Assessment & Plan Foot fractures Closed fractures of the second, third, and fourth metatarsals sustained two weeks ago. No surgery required. She is using a walking boot for stabilization. Weight-bearing as tolerated is advised starting this week. She is concerned about the lack of follow-up x-rays and is considering scheduling an appointment for a check-up in two  weeks. The fractures are closed, and no high-velocity trauma was involved, reducing the need for immediate surgical intervention. - Continue using walking boot for six weeks - Schedule follow-up appointment for x-rays in two weeks  Type 1 Diabetes Mellitus She follows up with endocrinology for management. Last A1c was 9, indicating suboptimal control. A follow-up appointment with endocrinology is scheduled for  April 16th. She has obtained new sensors for the insulin pump, improving regularity. Previous A1c was 6.5-6.7, indicating a recent increase in glucose levels. - Continue follow-up with endocrinology on April 16th  Diabetic Neuropathy She is on gabapentin 200 mg three times daily for diabetic neuropathy.  Hypertension Blood pressure is 132/67, within the normal range. Previous readings have shown variability with occasional high readings. No symptoms such as headaches or vision changes reported. Recent home readings have been within normal limits, with occasional outliers.  Depression She is on sertraline 100 mg daily for depression.  Overactive Bladder She is on solifenacin 10 mg daily for overactive bladder.  Fibromyalgia She is on tizanidine for neck pain and spasms associated with fibromyalgia.  Arthritis She uses Tylenol 500 mg as needed for arthritis.  Fingernail changes She has changes in the fingernails, particularly the left index finger. No pain reported. Differential includes trauma or possible fungal infection. She is not currently under dermatology care. - Refer to dermatology for evaluation of fingernail changes  Living situation and care needs Her sisters are concerned about her living situation due to recent falls and suggest more supervised living arrangements. She is considering assisted living but is concerned about costs. Referral to a Child psychotherapist and nurse case manager is suggested to explore options. Assisted living facilities offer semi-independent living with staff available for assistance, but costs are a concern. - Refer to [facility] for social work and Financial risk analyst for assistance with living situation and care needs  General Health Maintenance She plans to schedule a mammogram and bone density test once her foot is healed. -pt will re-Schedule mammogram and bone density test once foot is healed  Follow-up She will follow up with the primary care provider  in July or August. Dermatology and value-based care team will contact her for further management. - Schedule follow-up appointment with primary care provider in July or August - Ensure dermatology and value-based care team contact her     Return in about 4 months (around 10/13/2023) for CHRONIC F/U.         Ronnald Ramp, MD  [facility] [phone] (phone) [phone] (fax)  [facility]

## 2023-06-14 DIAGNOSIS — G4730 Sleep apnea, unspecified: Secondary | ICD-10-CM | POA: Diagnosis not present

## 2023-06-14 DIAGNOSIS — S92322D Displaced fracture of second metatarsal bone, left foot, subsequent encounter for fracture with routine healing: Secondary | ICD-10-CM | POA: Diagnosis not present

## 2023-06-14 DIAGNOSIS — M797 Fibromyalgia: Secondary | ICD-10-CM | POA: Diagnosis not present

## 2023-06-14 DIAGNOSIS — K219 Gastro-esophageal reflux disease without esophagitis: Secondary | ICD-10-CM | POA: Diagnosis not present

## 2023-06-14 DIAGNOSIS — S92342D Displaced fracture of fourth metatarsal bone, left foot, subsequent encounter for fracture with routine healing: Secondary | ICD-10-CM | POA: Diagnosis not present

## 2023-06-14 DIAGNOSIS — Z9181 History of falling: Secondary | ICD-10-CM | POA: Diagnosis not present

## 2023-06-14 DIAGNOSIS — E10649 Type 1 diabetes mellitus with hypoglycemia without coma: Secondary | ICD-10-CM | POA: Diagnosis not present

## 2023-06-14 DIAGNOSIS — Z86711 Personal history of pulmonary embolism: Secondary | ICD-10-CM | POA: Diagnosis not present

## 2023-06-14 DIAGNOSIS — M4800 Spinal stenosis, site unspecified: Secondary | ICD-10-CM | POA: Diagnosis not present

## 2023-06-14 DIAGNOSIS — M1990 Unspecified osteoarthritis, unspecified site: Secondary | ICD-10-CM | POA: Diagnosis not present

## 2023-06-14 DIAGNOSIS — I10 Essential (primary) hypertension: Secondary | ICD-10-CM | POA: Diagnosis not present

## 2023-06-14 DIAGNOSIS — E785 Hyperlipidemia, unspecified: Secondary | ICD-10-CM | POA: Diagnosis not present

## 2023-06-14 DIAGNOSIS — E1042 Type 1 diabetes mellitus with diabetic polyneuropathy: Secondary | ICD-10-CM | POA: Diagnosis not present

## 2023-06-14 DIAGNOSIS — M545 Low back pain, unspecified: Secondary | ICD-10-CM | POA: Diagnosis not present

## 2023-06-14 DIAGNOSIS — S92332D Displaced fracture of third metatarsal bone, left foot, subsequent encounter for fracture with routine healing: Secondary | ICD-10-CM | POA: Diagnosis not present

## 2023-06-14 DIAGNOSIS — G8929 Other chronic pain: Secondary | ICD-10-CM | POA: Diagnosis not present

## 2023-06-14 DIAGNOSIS — Z90710 Acquired absence of both cervix and uterus: Secondary | ICD-10-CM | POA: Diagnosis not present

## 2023-06-14 DIAGNOSIS — F4323 Adjustment disorder with mixed anxiety and depressed mood: Secondary | ICD-10-CM | POA: Diagnosis not present

## 2023-06-14 DIAGNOSIS — Z9089 Acquired absence of other organs: Secondary | ICD-10-CM | POA: Diagnosis not present

## 2023-06-14 DIAGNOSIS — H9193 Unspecified hearing loss, bilateral: Secondary | ICD-10-CM | POA: Diagnosis not present

## 2023-06-14 DIAGNOSIS — J45909 Unspecified asthma, uncomplicated: Secondary | ICD-10-CM | POA: Diagnosis not present

## 2023-06-14 DIAGNOSIS — I700 Atherosclerosis of aorta: Secondary | ICD-10-CM | POA: Diagnosis not present

## 2023-06-14 DIAGNOSIS — D72829 Elevated white blood cell count, unspecified: Secondary | ICD-10-CM | POA: Diagnosis not present

## 2023-06-15 ENCOUNTER — Telehealth: Payer: Self-pay

## 2023-06-15 ENCOUNTER — Telehealth: Payer: Self-pay | Admitting: Family Medicine

## 2023-06-15 DIAGNOSIS — S92322D Displaced fracture of second metatarsal bone, left foot, subsequent encounter for fracture with routine healing: Secondary | ICD-10-CM | POA: Diagnosis not present

## 2023-06-15 DIAGNOSIS — I10 Essential (primary) hypertension: Secondary | ICD-10-CM

## 2023-06-15 DIAGNOSIS — E1042 Type 1 diabetes mellitus with diabetic polyneuropathy: Secondary | ICD-10-CM | POA: Diagnosis not present

## 2023-06-15 DIAGNOSIS — D72829 Elevated white blood cell count, unspecified: Secondary | ICD-10-CM

## 2023-06-15 DIAGNOSIS — S92342D Displaced fracture of fourth metatarsal bone, left foot, subsequent encounter for fracture with routine healing: Secondary | ICD-10-CM | POA: Diagnosis not present

## 2023-06-15 DIAGNOSIS — S92332D Displaced fracture of third metatarsal bone, left foot, subsequent encounter for fracture with routine healing: Secondary | ICD-10-CM | POA: Diagnosis not present

## 2023-06-15 DIAGNOSIS — J45909 Unspecified asthma, uncomplicated: Secondary | ICD-10-CM

## 2023-06-15 DIAGNOSIS — E785 Hyperlipidemia, unspecified: Secondary | ICD-10-CM | POA: Diagnosis not present

## 2023-06-15 DIAGNOSIS — M797 Fibromyalgia: Secondary | ICD-10-CM

## 2023-06-15 DIAGNOSIS — I700 Atherosclerosis of aorta: Secondary | ICD-10-CM

## 2023-06-15 DIAGNOSIS — E10649 Type 1 diabetes mellitus with hypoglycemia without coma: Secondary | ICD-10-CM | POA: Diagnosis not present

## 2023-06-15 DIAGNOSIS — F4323 Adjustment disorder with mixed anxiety and depressed mood: Secondary | ICD-10-CM

## 2023-06-15 NOTE — Progress Notes (Signed)
 Complex Care Management Note Care Guide Note  06/15/2023 Name: Joyce Brown MRN: [id number] DOB: [birth year]   Complex Care Management Outreach Attempts: An unsuccessful telephone outreach was attempted today to offer the patient information about available complex care management services.  Follow Up Plan:  Additional outreach attempts will be made to offer the patient complex care management information and services.   Encounter Outcome:  No Answer  Penne Lash , RMA     [place]  [facility], [facility] Guide  Direct Dial: [phone]  Website: [email or web address]

## 2023-06-15 NOTE — Telephone Encounter (Signed)
 Certification paperwork was received on 06/15/2023 & placed in provider's mailbox. Please allow 7-10 business days to be completed. Thank you

## 2023-06-15 NOTE — Progress Notes (Signed)
 Complex Care Management Note  Care Guide Note 06/15/2023 Name: Joyce Brown MRN: [id number] DOB: 08-22-1950  Joyce Brown is a 74 y.o. year old female who sees Simmons-Robinson, Tawanna Cooler, MD for primary care. I reached out to [name] by phone today to offer complex care management services.  [name] was given information about Complex Care Management services today including:   The Complex Care Management services include support from the care team which includes your Nurse Care Manager, Clinical Social Worker, or Pharmacist.  The Complex Care Management team is here to help remove barriers to the health concerns and goals most important to you. Complex Care Management services are voluntary, and the patient may decline or stop services at any time by request to their care team member.   Complex Care Management Consent Status: Patient agreed to services and verbal consent obtained.   Follow up plan:  Telephone appointment with complex care management team member scheduled for:  [facility] 06/23/2023 LCSW 06/30/2023  Encounter Outcome:  Patient Scheduled  Penne Lash , RMA     [place]  [facility], [facility] Guide  Direct Dial: [phone]  Website: [email or web address]

## 2023-06-16 ENCOUNTER — Telehealth: Payer: Self-pay | Admitting: Family Medicine

## 2023-06-16 DIAGNOSIS — B351 Tinea unguium: Secondary | ICD-10-CM | POA: Diagnosis not present

## 2023-06-16 NOTE — Telephone Encounter (Signed)
 [facility] sent fax for certification 06/15/23 and front desk portion was completed and placed in provider mailbox and provider completed and paperwork faxed back 06/16/23

## 2023-06-17 ENCOUNTER — Encounter: Payer: Self-pay | Admitting: Family Medicine

## 2023-06-20 DIAGNOSIS — Z23 Encounter for immunization: Secondary | ICD-10-CM | POA: Diagnosis not present

## 2023-06-21 DIAGNOSIS — E1042 Type 1 diabetes mellitus with diabetic polyneuropathy: Secondary | ICD-10-CM | POA: Diagnosis not present

## 2023-06-21 DIAGNOSIS — S92342D Displaced fracture of fourth metatarsal bone, left foot, subsequent encounter for fracture with routine healing: Secondary | ICD-10-CM | POA: Diagnosis not present

## 2023-06-21 DIAGNOSIS — S92322D Displaced fracture of second metatarsal bone, left foot, subsequent encounter for fracture with routine healing: Secondary | ICD-10-CM | POA: Diagnosis not present

## 2023-06-21 DIAGNOSIS — E10649 Type 1 diabetes mellitus with hypoglycemia without coma: Secondary | ICD-10-CM | POA: Diagnosis not present

## 2023-06-21 DIAGNOSIS — S92332D Displaced fracture of third metatarsal bone, left foot, subsequent encounter for fracture with routine healing: Secondary | ICD-10-CM | POA: Diagnosis not present

## 2023-06-21 DIAGNOSIS — E785 Hyperlipidemia, unspecified: Secondary | ICD-10-CM | POA: Diagnosis not present

## 2023-06-22 DIAGNOSIS — H903 Sensorineural hearing loss, bilateral: Secondary | ICD-10-CM | POA: Diagnosis not present

## 2023-06-22 DIAGNOSIS — H6121 Impacted cerumen, right ear: Secondary | ICD-10-CM | POA: Diagnosis not present

## 2023-06-23 ENCOUNTER — Ambulatory Visit: Payer: Self-pay

## 2023-06-23 ENCOUNTER — Other Ambulatory Visit: Payer: Self-pay

## 2023-06-23 DIAGNOSIS — E785 Hyperlipidemia, unspecified: Secondary | ICD-10-CM | POA: Diagnosis not present

## 2023-06-23 DIAGNOSIS — E10649 Type 1 diabetes mellitus with hypoglycemia without coma: Secondary | ICD-10-CM | POA: Diagnosis not present

## 2023-06-23 DIAGNOSIS — E1042 Type 1 diabetes mellitus with diabetic polyneuropathy: Secondary | ICD-10-CM | POA: Diagnosis not present

## 2023-06-23 DIAGNOSIS — S92322D Displaced fracture of second metatarsal bone, left foot, subsequent encounter for fracture with routine healing: Secondary | ICD-10-CM | POA: Diagnosis not present

## 2023-06-23 DIAGNOSIS — S92342D Displaced fracture of fourth metatarsal bone, left foot, subsequent encounter for fracture with routine healing: Secondary | ICD-10-CM | POA: Diagnosis not present

## 2023-06-23 DIAGNOSIS — S92332D Displaced fracture of third metatarsal bone, left foot, subsequent encounter for fracture with routine healing: Secondary | ICD-10-CM | POA: Diagnosis not present

## 2023-06-23 NOTE — Patient Outreach (Unsigned)
 Complex Care Management   Visit Note  06/24/2023  Name:  Joyce Brown MRN: [id number] DOB: [birth year]  Situation: Referral received for Complex Care Management related to  diabetes  I obtained verbal consent from patient.  Visit completed with patient  on the phone  Background:   Past Medical History:  Diagnosis Date   Arthritis 1995   Have family history of it.   Asthma 1995   Has resolved   Cataract 2017   Family History of them   Cerebellar ataxia (HCC)    Depression 1996   Due to Fibromyalgia.   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   Glaucoma Various   Family History of it.   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  has bilateraly hearing aids.)    HEENT Tinnitus, Other: (dry mouth)    Cardiovascular No symptoms reported    Respiratory No symptoms reported    Endocrine No symptoms reported    Gastrointestinal No symptoms reported    Genitourinary Incontinence    Integumentary Wound    Musculoskeletal Difficulty walking, Unsteady gait (patient states this is due to diagnosis of cerebellar ataxia)     Psychosocial No symptoms reported     Vitals:   06/23/23 1459 06/23/23 1500  BP: (!) 120/52 120/80    Medications Reviewed Today     Reviewed by Otho Ket, RN (Registered Nurse) on 06/23/23 at 1508  Med List Status: <None>   Medication Order Taking? Sig Documenting Provider Last Dose Status Informant  Acetaminophen 500 MG capsule [id number] Yes Take 500 mg by mouth every 6 (six) hours as needed for moderate pain. [provider] Taking Active Self  antiseptic oral rinse (BIOTENE) LIQD [id number] Yes 15 mLs by Mouth Rinse route in the morning, at noon, in the evening, and at bedtime. [provider] Taking Active Self  atorvastatin (LIPITOR) 80 MG tablet [id number] Yes TAKE 1 TABLET (80 MG TOTAL) BY MOUTH DAILY. Debera Lat, PA-C Taking Active   BD INSULIN SYRINGE U/F 1/2UNIT 31G X 5/16" 0.3 ML MISC [id number]  USE 3 TIMES A DAY Shamleffer, Konrad Dolores, MD  Active Self  carboxymethylcellulose (REFRESH PLUS) 0.5 % SOLN [id number] Yes Place 1 drop into both eyes 3 (three) times daily as needed. [provider] Taking Active Self  Cholecalciferol (VITAMIN D3) 1000 units CAPS [id number] Yes Take by mouth. [provider] Taking Active   Continuous Blood Gluc Sensor (DEXCOM G7 SENSOR) MISC [id number]  by Does not apply route. [provider]  Active Self  Cyanocobalamin (VITAMIN B-12 PO) [id number] Yes Take by mouth. [provider] Taking Active   docusate sodium (  COLACE) 100 MG capsule [id number]  Take 100 mg by mouth daily as needed for mild constipation. [provider]  Active Self  estradiol (ESTRACE) 0.1 MG/GM vaginal cream [id number] Yes Place 1 Applicatorful vaginally 2 (two) times a week. [provider] Taking Active Self           Med Note Etta Grandchild Jan 11, 2023  1:14 PM) Takes 0.5gm  Ferrous Sulfate (IRON PO) [id number] Yes Take by mouth. [provider] Taking Active   gabapentin  (NEURONTIN) 100 MG capsule [id number] Yes Take 2 capsules (200 mg total) by mouth 3 (three) times daily. Simmons-Robinson, Makiera, MD Taking Active Self  Glucagon (BAQSIMI TWO PACK) 3 MG/DOSE POWD [id number] Yes Place 3 mg into the nose as needed (For hypoglycemia). Arnetha Courser, MD Taking Active   glucose blood test strip [id number]  Use [provider]  Active Self  insulin aspart (NOVOLOG PENFILL) cartridge [id number] Yes USE MAX OF DAILY DOSE OF 60 UNITS PER CORRECTION SCALE AS DIRECTED  Patient taking differently: USE MAX OF DAILY DOSE OF 60 UNITS PER CORRECTION SCALE AS DIRECTED Taking 8 units before breakfast; 10 units before lunch; 7 units before dinner   Shamleffer, Konrad Dolores, MD Taking Active Self  Insulin Disposable Pump (OMNIPOD 5 DEXG7G6 PODS GEN 5) MISC [id number]  SMARTSIG:SUB-Q Every 3 Days [provider]  Active Self  Insulin Pen Needle (DROPLET PEN NEEDLES) 31G X 5 MM MISC [id number]  USE 4 TIMES DAILY AS DIRECTED BY YOUR DOCTOR Shamleffer, Konrad Dolores, MD  Active Self  Microlet Lancets MISC [id number]  3 (three) times daily. [provider]  Active Self  Omega 3 1200 MG CAPS [id number] Yes Take by mouth. One daily [provider] Taking Active Self  sertraline (ZOLOFT) 100 MG tablet [id number] Yes TAKE 1 TABLET EVERY DAY Simmons-Robinson, Makiera, MD Taking Active Self  Sodium Fluoride 1.1 % PSTE [id number] Yes Use 1 Application Nightly after brushing/flossing. Spit out excess. No rinse/eat/drink for at least 30 minutes after application  Taking Active Self  solifenacin (VESICARE) 10 MG tablet [id number] Yes Take 10 mg by mouth daily. [provider] Taking Active Self  Tavaborole 5 % SOLN [id number] Yes Apply topically. [provider] Taking Active Self  tiZANidine (ZANAFLEX) 2 MG tablet [id number] Yes TAKE 1 TO 2 TABLETS BY MOUTH AT BEDTIME AS NEEDED FOR MUSCLE SPASMS Simmons-Robinson, Makiera, MD Taking Active              Recommendation:   PCP Follow-up Specialty provider follow-up podiatry  Follow Up Plan:   Telephone follow-up in 2 weeks with RN care manager  George Ina RN, BSN, CCM [place]  [facility], Population Health Case Manager Phone: [phone]

## 2023-06-24 NOTE — Patient Instructions (Signed)
 Visit Information  Thank you for taking time to visit with me today. Please don't hesitate to contact me if I can be of assistance to you before our next scheduled appointment.  Our next appointment is by telephone on 07/08/23 at 10 am Please call the care guide team at [phone] if you need to cancel or reschedule your appointment.   Following is a copy of your care plan:   Goals Addressed             This Visit's Progress    VBCI RN Care Plan- Diabetes       Problems:  Chronic Disease Management support and education needs related to DMII  Goal: Over the next 3 months the Patient will attend all scheduled medical appointments: with providers as evidenced by patient report/ chart review         continue to work with RN Care Manager and/or Social Worker to address care management and care coordination needs related to DMII as evidenced by adherence to CM Team Scheduled appointments     demonstrate Ongoing adherence to prescribed treatment plan for DMII as evidenced by patient report/ chart review.  take all medications exactly as prescribed and will call provider for medication related questions as evidenced by patient report/ chart review.      Interventions:   Diabetes Interventions: Assessed patient's understanding of A1c goal: <7% Reviewed medications with patient and discussed importance of medication adherence Discussed plans with patient for ongoing care management follow up and provided patient with direct contact information for care management team Reviewed scheduled/upcoming provider appointments including:   Advised to monitor blood sugar as recommended by provider  Lab Results  Component Value Date   HGBA1C 9.1 (H) 04/11/2023    Patient Self-Care Activities:  Attend all scheduled provider appointments Call pharmacy for medication refills 3-7 days in advance of running out of medications Call provider office for new concerns or questions  Take medications as  prescribed   check blood sugar at prescribed times: before meals and at bedtime and when you have symptoms of low or high blood sugar check feet daily for cuts, sores or redness take the blood sugar log to all doctor visits Notify provider for frequent blood sugars <70 or >250  Plan:  Telephone follow up appointment with care management team member scheduled for:  07/08/23 at 10 am           VBCI RN Care Plan-impaired mobility       Problems:  Chronic Disease Management support and education needs related to Impaired mobility/ cerebellar Ataxia/ left foot fracture Lacks caregiver support.  Goal: Over the next 3 months the Patient will attend all scheduled medical appointments: with providers as evidenced by patient report/ chart review        continue to work with RN Care Manager and/or Social Worker to address care management and care coordination needs related to impaired mobility as evidenced by adherence to CM Team Scheduled appointments     demonstrate Ongoing adherence to prescribed treatment plan for impaired mobility as evidenced by patient report/ chart review.  take all medications exactly as prescribed and will call provider for medication related questions as evidenced by patient report    Demonstrate decrease in falls Continue to wear cam boot to left foot for stabilization Continue to work with home health PT/OT  Interventions: Immobility/ fallI interventions: Assessed for falls since last encounter Advised patient to discuss any new or ongoing concerns/ symptoms with provider Assessed social  determinant of health barriers Discussed assisted living option to meet ongoing health care needs. Fall prevention discussed SDOH reviewed Confirmed patient receiving home health PT/OT/SN and home health aid services  Patient Self-Care Activities:  Attend all scheduled provider appointments Call pharmacy for medication refills 3-7 days in advance of running out of  medications Call provider office for new concerns or questions  Take medications as prescribed   Continue to follow up with your neurologist and notify of any new or ongoing symptoms.   Plan:  Telephone follow up appointment with care management team member scheduled for:  07/08/23 at 10 am             Please call the Suicide and Crisis Lifeline: 988 call 1-800-273-TALK (toll free, 24 hour hotline) if you are experiencing a Mental Health or Behavioral Health Crisis or need someone to talk to.  Patient verbalizes understanding of instructions and care plan provided today and agrees to view in MyChart. Active MyChart status and patient understanding of how to access instructions and care plan via MyChart confirmed with patient.     George Ina RN, BSN, CCM [employer], Population Health Case Manager Phone: [phone]

## 2023-06-27 DIAGNOSIS — S92332D Displaced fracture of third metatarsal bone, left foot, subsequent encounter for fracture with routine healing: Secondary | ICD-10-CM | POA: Diagnosis not present

## 2023-06-27 DIAGNOSIS — S92342D Displaced fracture of fourth metatarsal bone, left foot, subsequent encounter for fracture with routine healing: Secondary | ICD-10-CM | POA: Diagnosis not present

## 2023-06-27 DIAGNOSIS — E1042 Type 1 diabetes mellitus with diabetic polyneuropathy: Secondary | ICD-10-CM | POA: Diagnosis not present

## 2023-06-27 DIAGNOSIS — S92322D Displaced fracture of second metatarsal bone, left foot, subsequent encounter for fracture with routine healing: Secondary | ICD-10-CM | POA: Diagnosis not present

## 2023-06-27 DIAGNOSIS — E10649 Type 1 diabetes mellitus with hypoglycemia without coma: Secondary | ICD-10-CM | POA: Diagnosis not present

## 2023-06-27 DIAGNOSIS — E785 Hyperlipidemia, unspecified: Secondary | ICD-10-CM | POA: Diagnosis not present

## 2023-06-28 DIAGNOSIS — S92322D Displaced fracture of second metatarsal bone, left foot, subsequent encounter for fracture with routine healing: Secondary | ICD-10-CM | POA: Diagnosis not present

## 2023-06-28 DIAGNOSIS — E10649 Type 1 diabetes mellitus with hypoglycemia without coma: Secondary | ICD-10-CM | POA: Diagnosis not present

## 2023-06-28 DIAGNOSIS — E1042 Type 1 diabetes mellitus with diabetic polyneuropathy: Secondary | ICD-10-CM | POA: Diagnosis not present

## 2023-06-28 DIAGNOSIS — S92332D Displaced fracture of third metatarsal bone, left foot, subsequent encounter for fracture with routine healing: Secondary | ICD-10-CM | POA: Diagnosis not present

## 2023-06-28 DIAGNOSIS — E785 Hyperlipidemia, unspecified: Secondary | ICD-10-CM | POA: Diagnosis not present

## 2023-06-28 DIAGNOSIS — S92342D Displaced fracture of fourth metatarsal bone, left foot, subsequent encounter for fracture with routine healing: Secondary | ICD-10-CM | POA: Diagnosis not present

## 2023-06-30 ENCOUNTER — Other Ambulatory Visit: Payer: Self-pay

## 2023-06-30 ENCOUNTER — Ambulatory Visit: Payer: Self-pay | Admitting: *Deleted

## 2023-06-30 NOTE — Patient Outreach (Signed)
 Complex Care Management   Visit Note  06/30/2023  Name:  Joyce Brown MRN: [id number] DOB: 1950/04/19  Situation: Referral received for Complex Care Management related to SDOH Barriers:  Housing Patient has concerns about living alone due to her ambulatory status.   I obtained verbal consent from Patient.  Visit completed with patient  on the phone  Background:   Past Medical History:  Diagnosis Date   Arthritis 1995   Have family history of it.   Asthma 1995   Has resolved   Cataract 2017   Family History of them   Cerebellar ataxia (HCC)    Depression 1996   Due to Fibromyalgia.   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   Glaucoma Various   Family History of it.   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    Assessment: Patient Reported Symptoms:  Cognitive Cognitive Status: Alert and oriented to person, place, and time, Normal speech and language skills, Insightful and able to interpret abstract concepts Cognitive/Intellectual Conditions Management [RPT]: None reported or documented in medical history or problem list   Health Maintenance Behaviors: Annual physical exam, Exercise, Immunizations, Sleep adequate (active with [facility]) Healing Pattern: Average Health Facilitated by: Healthy diet  Neurological      HEENT        Cardiovascular  Cardiovascular Symptoms Reported: No symptoms reported    Respiratory Respiratory Symptoms Reported: No symptoms reported    Endocrine Patient reports the following symptoms related to hypoglycemia or hyperglycemia : No symptoms reported Is patient diabetic?: Yes Is patient checking blood sugars at home?: Yes Endocrine Conditions: Diabetes Endocrine Management Strategies: Diet modification, Medication therapy, Weight management Endocrine Self-Management Outcome: 4 (good)  Gastrointestinal        Genitourinary   Genitourinary Conditions: Incontinence Genitourinary Management Strategies: Incontinence garment/pad  Integumentary Integumentary Symptoms Reported: No symptoms reported (reports that pressure sore has resolved)    Musculoskeletal Musculoskelatal Symptoms Reviewed: Difficulty walking, Unsteady gait Additional Musculoskeletal Details: left foot fracture Musculoskeletal Conditions:  (left foot fracture) Falls in the past year?: Yes Number of falls in past year: 2 or more Was there an injury with Fall?: Yes Fall Risk Category Calculator: 3 Patient Fall Risk Level: High Fall Risk Patient at Risk for Falls Due to: History of fall(s), Impaired balance/gait, Impaired mobility  Psychosocial Psychosocial Symptoms Reported: No symptoms reported     Quality of Family Relationships: supportive Do you feel physically threatened by others?: No      06/30/2023   10:29 AM  Depression screen PHQ 2/9  Decreased Interest 0  Down, Depressed, Hopeless 0  PHQ - 2 Score 0    Vitals:   06/30/23 1017  BP: 120/84    Medications Reviewed Today     Reviewed by Wenda Overland, LCSW (Social Worker) on 06/30/23 at 1026  Med List Status: <  None>   Medication Order Taking? Sig Documenting Provider Last Dose Status Informant  Acetaminophen 500 MG capsule [id number] Yes Take 500 mg by mouth every 6 (six) hours as needed for moderate pain. [provider] Taking Active Self   antiseptic oral rinse (BIOTENE) LIQD [id number] Yes 15 mLs by Mouth Rinse route in the morning, at noon, in the evening, and at bedtime. [provider] Taking Active Self  atorvastatin (LIPITOR) 80 MG tablet [id number] Yes TAKE 1 TABLET (80 MG TOTAL) BY MOUTH DAILY. Ostwalt, Janna, PA-C Taking Active   BD INSULIN SYRINGE U/F 1/2UNIT 31G X 5/16" 0.3 ML MISC [id number] Yes USE 3 TIMES A DAY Shamleffer, Julian Obey, MD Taking Active Self  carboxymethylcellulose (REFRESH PLUS) 0.5 % SOLN [id number] Yes Place 1 drop into both eyes 3 (three) times daily as needed. [provider] Taking Active Self  Cholecalciferol (VITAMIN D3) 1000 units CAPS [id number] Yes Take by mouth. [provider] Taking Active   Continuous Blood Gluc Sensor (DEXCOM G7 SENSOR) MISC [id number] Yes by Does not apply route. [provider] Taking Active Self  Cyanocobalamin (VITAMIN B-12 PO) [id number] Yes Take by mouth. [provider] Taking Active   docusate sodium (COLACE) 100 MG capsule [id number] Yes Take 100 mg by mouth daily as needed for mild constipation. [provider] Taking Active Self  estradiol (ESTRACE) 0.1 MG/GM vaginal cream [id number] Yes Place 1 Applicatorful vaginally 2 (two) times a week. [provider] Taking Active Self           Med Note Mallie Seal Jan 11, 2023  1:14 PM) Takes 0.5gm  Ferrous Sulfate (IRON PO) [id number] Yes Take by mouth. [provider] Taking Active   gabapentin (NEURONTIN) 100 MG capsule [id number] Yes Take 2 capsules (200 mg total) by mouth 3 (three) times daily. Simmons-Robinson, Makiera, MD Taking Active Self  Glucagon (BAQSIMI TWO PACK) 3 MG/DOSE POWD [id number] Yes Place 3 mg into the nose as needed (For hypoglycemia). Luna Salinas, MD Taking Active   glucose blood test strip [id number]  Use [provider]  Active Self  insulin aspart (NOVOLOG PENFILL) cartridge [id number] Yes USE MAX OF DAILY  DOSE OF 60 UNITS PER CORRECTION SCALE AS DIRECTED  Patient taking differently: USE MAX OF DAILY DOSE OF 60 UNITS PER CORRECTION SCALE AS DIRECTED Taking 8 units before breakfast; 10 units before lunch; 7 units before dinner   Shamleffer, Julian Obey, MD Taking Active Self  Insulin Disposable Pump (OMNIPOD 5 DEXG7G6 PODS GEN 5) MISC [id number] Yes SMARTSIG:SUB-Q Every 3 Days [provider] Taking Active Self  Insulin Pen Needle (DROPLET PEN NEEDLES) 31G X 5 MM MISC [id number] Yes USE 4 TIMES DAILY AS DIRECTED BY YOUR DOCTOR Shamleffer, Julian Obey, MD Taking Active Self  Microlet Lancets MISC [id number] Yes 3 (three) times daily. [provider] Taking Active Self  Omega 3 1200 MG CAPS [id number] Yes Take by mouth. One daily [provider] Taking Active Self  sertraline (ZOLOFT) 100 MG tablet [id number] Yes TAKE 1 TABLET EVERY DAY Simmons-Robinson, Makiera, MD Taking Active Self  Sodium Fluoride 1.1 % PSTE [id number] Yes Use 1 Application Nightly after brushing/flossing. Spit out excess. No rinse/eat/drink for at least 30 minutes after application  Taking Active Self  solifenacin (VESICARE) 10 MG tablet [id number] Yes Take 10 mg by mouth daily. [provider] Taking Active Self  Tavaborole 5 % SOLN [id number] Yes Apply topically. [provider] Taking Active Self  tiZANidine (ZANAFLEX) 2 MG  tablet [id number] Yes TAKE 1 TO 2 TABLETS BY MOUTH AT BEDTIME AS NEEDED FOR MUSCLE SPASMS Simmons-Robinson, Makiera, MD Taking Active             Recommendation:   Patient to review placement options that will be emailed to her for her review. Fl2 to be requested from provider once facility is identified.  Follow Up Plan:   Telephone follow-up 07/05/23  Michaelle Adolphus, LCSW   [facility], [facility] Licensed Clinical Social Worker Care Coordinator  Direct Dial: [phone]

## 2023-06-30 NOTE — Patient Instructions (Signed)
 Visit Information  Thank you for taking time to visit with me today. Please don't hesitate to contact me if I can be of assistance to you before our next scheduled appointment.  Our next appointment is by telephone on 07/05/23 at 11am Please call the care guide team at [phone] if you need to cancel or reschedule your appointment.   Following is a copy of your care plan:   Goals Addressed             This Visit's Progress    VBCI Social Work Care Plan       Problems:   Level of Care Concerns:Facility placement (patient looking into Assisted Living options due to limits in her ambulatory status )  CSW Clinical Goal(s):   Over the next 90 days the Patient will attend all scheduled medical appointments as evidenced by patient report and care team review of appointment completion in electronic MEDICAL RECORD NUMBER  explore community resource options for unmet needs related to Housing .  Interventions:  Level of Care Concerns in a patient with Impaired mobility and activities of daily living as well as diabetes management Current level of care: home, alone Evaluation of patient's unmet needs in current living environment  Facility  Assessed needs and reviewed facility placement process; as well as the different levels of care CSW to assist with faxing FL2 to identified facilities once completed and signed by PCP Discussed payment options for placement :including private pay and Long term Care Medicaid Provided a list of facilities based on level of care needs Reviewed facility placement process   Patient Goals/Self-Care Activities:  Review Assisted Living list once received and consider setting up tours to discuss placement options in further detail  Plan:   Telephone follow up appointment with care management team member scheduled for:  07/05/23        Please call 911 if you are experiencing a Mental Health or Behavioral Health Crisis or need someone to talk to.  Patient  verbalizes understanding of instructions and care plan provided today and agrees to view in MyChart. Active MyChart status and patient understanding of how to access instructions and care plan via MyChart confirmed with patient.     Corie Vavra, LCSW [place]  [facility], [facility] Licensed Clinical Social Worker Care Coordinator  Direct Dial: [phone]

## 2023-07-04 DIAGNOSIS — S92322D Displaced fracture of second metatarsal bone, left foot, subsequent encounter for fracture with routine healing: Secondary | ICD-10-CM | POA: Diagnosis not present

## 2023-07-04 DIAGNOSIS — S92332D Displaced fracture of third metatarsal bone, left foot, subsequent encounter for fracture with routine healing: Secondary | ICD-10-CM | POA: Diagnosis not present

## 2023-07-04 DIAGNOSIS — E785 Hyperlipidemia, unspecified: Secondary | ICD-10-CM | POA: Diagnosis not present

## 2023-07-04 DIAGNOSIS — E1042 Type 1 diabetes mellitus with diabetic polyneuropathy: Secondary | ICD-10-CM | POA: Diagnosis not present

## 2023-07-04 DIAGNOSIS — E10649 Type 1 diabetes mellitus with hypoglycemia without coma: Secondary | ICD-10-CM | POA: Diagnosis not present

## 2023-07-04 DIAGNOSIS — S92342D Displaced fracture of fourth metatarsal bone, left foot, subsequent encounter for fracture with routine healing: Secondary | ICD-10-CM | POA: Diagnosis not present

## 2023-07-05 ENCOUNTER — Other Ambulatory Visit: Payer: Self-pay

## 2023-07-05 ENCOUNTER — Other Ambulatory Visit: Payer: Self-pay | Admitting: *Deleted

## 2023-07-05 ENCOUNTER — Ambulatory Visit (INDEPENDENT_AMBULATORY_CARE_PROVIDER_SITE_OTHER): Admitting: Podiatry

## 2023-07-05 DIAGNOSIS — S92302A Fracture of unspecified metatarsal bone(s), left foot, initial encounter for closed fracture: Secondary | ICD-10-CM

## 2023-07-05 NOTE — Patient Outreach (Signed)
 Complex Care Management   Visit Note  07/05/2023  Name:  Joyce Brown MRN: [id number] DOB: Sep 10, 1950  Situation: Referral received for Complex Care Management related to SDOH Barriers:  Housing Assisted Living Placement  I obtained verbal consent from Patient.  Visit completed with Patient  on the phone  Background:   Past Medical History:  Diagnosis Date   Arthritis 1995   Have family history of it.   Asthma 1995   Has resolved   Cataract 2017   Family History of them   Cerebellar ataxia (HCC)    Depression 1996   Due to Fibromyalgia.   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   Glaucoma Various   Family History of it.   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    Assessment: Patient Reported Symptoms:  Cognitive Cognitive Status: Alert and oriented to person, place, and time, Insightful and able to interpret abstract concepts, Normal speech and language skills Cognitive/Intellectual Conditions Management [RPT]: None reported or documented in medical history or problem list   Health Maintenance Behaviors: Annual physical exam, Immunizations, Sleep adequate (Active with [facility]) Healing Pattern: Average Health Facilitated by: Healthy diet  Neurological      HEENT HEENT Symptoms Reported: Not assessed      Cardiovascular Cardiovascular Symptoms  Reported: Not assessed    Respiratory Respiratory Symptoms Reported: Not assesed    Endocrine Patient reports the following symptoms related to hypoglycemia or hyperglycemia : Not assessed    Gastrointestinal Gastrointestinal Symptoms Reported: Not assessed      Genitourinary Genitourinary Symptoms Reported: Not assessed    Integumentary Integumentary Symptoms Reported: Not assessed    Musculoskeletal Musculoskelatal Symptoms Reviewed: Not assessed        Psychosocial Psychosocial Symptoms Reported: Not assessed            06/30/2023   10:29 AM  Depression screen PHQ 2/9  Decreased Interest 0  Down, Depressed, Hopeless 0  PHQ - 2 Score 0    There were no vitals filed for this visit.  Medications Reviewed Today     Reviewed by Ave Leisure, LCSW (Social Worker) on 07/05/23 at 1456  Med List Status: <None>   Medication Order Taking? Sig Documenting Provider Last Dose Status Informant  Acetaminophen  500 MG capsule [id number] Yes Take 500 mg by mouth every 6 (six) hours as needed for moderate pain. [provider] Taking Active Self  antiseptic oral rinse (BIOTENE) LIQD [id number] Yes 15 mLs by Mouth Rinse route in the morning, at noon, in the evening, and at bedtime. [provider] Taking Active Self  atorvastatin  (LIPITOR) 80 MG tablet [id number] Yes TAKE 1 TABLET (80 MG TOTAL) BY MOUTH DAILY. Ostwalt, Janna, PA-C Taking Active   BD INSULIN  SYRINGE U/F 1/2UNIT 31G X 5/16" 0.3 ML MISC [id number] Yes USE 3 TIMES A DAY Shamleffer,  Ibtehal Jaralla, MD Taking Active Self  carboxymethylcellulose (REFRESH PLUS) 0.5 % SOLN [id number] Yes Place 1 drop into both eyes 3 (three) times daily as needed. [provider] Taking Active Self  Cholecalciferol (VITAMIN D3) 1000 units CAPS [id number] Yes Take by mouth. [provider] Taking Active   Continuous Blood Gluc Sensor (DEXCOM G7 SENSOR) MISC [id number] Yes by Does not apply route. [provider] Taking Active Self  Cyanocobalamin  (VITAMIN B-12 PO) [id number] Yes Take by mouth. [provider] Taking Active   docusate sodium (COLACE) 100 MG capsule [id number] Yes Take 100 mg by mouth daily as needed for mild constipation. [provider] Taking Active Self  estradiol (ESTRACE) 0.1 MG/GM vaginal cream [id number] Yes Place 1 Applicatorful vaginally 2 (two) times a week. [provider] Taking Active Self           Med Note Mallie Seal Jan 11, 2023  1:14 PM) Takes 0.5gm  Ferrous Sulfate (IRON PO) [id number]  Take by mouth. [provider]  Active   gabapentin  (NEURONTIN ) 100 MG capsule [id number] Yes Take 2 capsules (200 mg total) by mouth 3 (three) times daily. Simmons-Robinson, Makiera, MD Taking Active Self  Glucagon  (BAQSIMI  TWO PACK) 3 MG/DOSE POWD [id number] Yes Place 3 mg into the nose as needed (For hypoglycemia). Luna Salinas, MD Taking Active   glucose blood test strip [id number] Yes Use [provider] Taking Active Self  insulin  aspart (NOVOLOG  PENFILL) cartridge [id number] Yes USE MAX OF DAILY DOSE OF 60 UNITS PER CORRECTION SCALE AS DIRECTED  Patient taking differently: USE MAX OF DAILY DOSE OF 60 UNITS PER CORRECTION SCALE AS DIRECTED Taking 8 units before breakfast; 10 units before lunch; 7 units before dinner   Shamleffer, Julian Obey, MD Taking Active Self  Insulin  Disposable Pump (OMNIPOD 5 DEXG7G6 PODS GEN 5) MISC [id number] Yes SMARTSIG:SUB-Q Every 3 Days [provider] Taking Active Self  Insulin  Pen Needle (DROPLET PEN NEEDLES) 31G X 5 MM MISC [id number] Yes USE 4 TIMES DAILY AS DIRECTED BY YOUR DOCTOR Shamleffer, Julian Obey, MD Taking Active Self  Microlet Lancets MISC [id number] Yes 3 (three) times daily. [provider] Taking Active Self  Omega 3 1200 MG CAPS [id number] Yes Take by mouth. One daily [provider] Taking Active Self  sertraline  (ZOLOFT ) 100 MG tablet [id number]  TAKE  1 TABLET EVERY DAY Simmons-Robinson, Makiera, MD  Active Self  Sodium Fluoride  1.1 % PSTE [id number] Yes Use 1 Application Nightly after brushing/flossing. Spit out excess. No rinse/eat/drink for at least 30 minutes after application  Taking Active Self  solifenacin  (VESICARE ) 10 MG tablet [id number]  Take 10 mg by mouth daily. [provider]  Active Self  Tavaborole 5 % SOLN [id number] Yes Apply topically. [provider] Taking Active Self  tiZANidine  (ZANAFLEX ) 2 MG tablet [id number] Yes TAKE 1 TO 2 TABLETS BY MOUTH AT BEDTIME AS NEEDED FOR MUSCLE SPASMS Simmons-Robinson, Makiera, MD Taking Active             Recommendation:   CSW to request Fl2 for placement purposes once placement choice is made  Follow Up Plan:   Telephone follow-up 07/06/23 1;30pm  Glendia Olshefski, LCSW [place]  [facility], [facility] Licensed Clinical Social Worker Care Coordinator  Direct Dial: [phone]

## 2023-07-05 NOTE — Progress Notes (Signed)
 Subjective:  Patient ID: Joyce Brown, female    DOB: [birth year],  MRN: [id number]  Chief Complaint  Patient presents with   Foot Pain    Pt stated that she still has some slight discomfort that comes and goes     73 y.o. female presents with the above complaint.  Patient presents for follow-up left 2nd, 3rd and 4th metatarsal fracture.  She states she is doing a lot better pain is improved she still has some residual pain but overall much better.  Denies any other acute complaints  Review of Systems: Negative except as noted in the HPI. Denies N/V/F/Ch.  Past Medical History:  Diagnosis Date   Arthritis 1995   Have family history of it.   Asthma 1995   Has resolved   Cataract 2017   Family History of them   Cerebellar ataxia (HCC)    Depression 1996   Due to Fibromyalgia.   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   Glaucoma Various   Family History of it.   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    Current Outpatient Medications:    Acetaminophen  500 MG capsule, Take 500 mg by mouth every 6 (six) hours as needed for moderate pain., Disp: , Rfl:    antiseptic oral rinse (BIOTENE) LIQD, 15 mLs by Mouth Rinse route in the morning, at noon, in the evening, and at bedtime., Disp: , Rfl:    atorvastatin  (LIPITOR) 80 MG tablet, TAKE 1 TABLET (80 MG TOTAL) BY  MOUTH DAILY., Disp: 90 tablet, Rfl: 3   BD INSULIN  SYRINGE U/F 1/2UNIT 31G X 5/16" 0.3 ML MISC, USE 3 TIMES A DAY, Disp: 100 each, Rfl: 3   carboxymethylcellulose (REFRESH PLUS) 0.5 % SOLN, Place 1 drop into both eyes 3 (three) times daily as needed., Disp: , Rfl:    Cholecalciferol (VITAMIN D3) 1000 units CAPS, Take by mouth., Disp: , Rfl:    Continuous Blood Gluc Sensor (DEXCOM G7 SENSOR) MISC, by Does not apply route., Disp: , Rfl:    Cyanocobalamin  (VITAMIN B-12 PO), Take by mouth., Disp: , Rfl:    docusate sodium (COLACE) 100 MG capsule, Take 100 mg by mouth daily as needed for mild constipation., Disp: , Rfl:    estradiol (ESTRACE) 0.1 MG/GM vaginal cream, Place 1 Applicatorful vaginally 2 (two) times a week., Disp: , Rfl:    Ferrous Sulfate (IRON PO), Take by mouth., Disp: , Rfl:    gabapentin  (NEURONTIN ) 100 MG capsule, Take 2 capsules (200 mg total) by mouth 3 (three) times daily., Disp: , Rfl:    Glucagon  (BAQSIMI  TWO PACK) 3 MG/DOSE POWD, Place 3 mg into the nose as needed (For hypoglycemia)., Disp: 1 each, Rfl: 2   glucose blood test strip, Use, Disp: , Rfl:    insulin  aspart (NOVOLOG  PENFILL) cartridge, USE MAX OF DAILY DOSE  OF 60 UNITS PER CORRECTION SCALE AS DIRECTED (Patient taking differently: USE MAX OF DAILY DOSE OF 60 UNITS PER CORRECTION SCALE AS DIRECTED Taking 8 units before breakfast; 10 units before lunch; 7 units before dinner), Disp: 60 mL, Rfl: 2   Insulin  Disposable Pump (OMNIPOD 5 DEXG7G6 PODS GEN 5) MISC, SMARTSIG:SUB-Q Every 3 Days, Disp: , Rfl:    Insulin  Pen Needle (DROPLET PEN NEEDLES) 31G X 5 MM MISC, USE 4 TIMES DAILY AS DIRECTED BY YOUR DOCTOR, Disp: 400 each, Rfl: 3   Microlet Lancets MISC, 3 (three) times daily., Disp: , Rfl:    Omega 3 1200 MG CAPS, Take by mouth. One daily, Disp: , Rfl:    sertraline  (ZOLOFT ) 100 MG tablet, TAKE 1 TABLET EVERY DAY, Disp: 90 tablet, Rfl: 3   Sodium Fluoride  1.1 % PSTE, Use 1 Application Nightly after brushing/flossing. Spit  out excess. No rinse/eat/drink for at least 30 minutes after application, Disp: 100 mL, Rfl: 0   solifenacin  (VESICARE ) 10 MG tablet, Take 10 mg by mouth daily., Disp: , Rfl:    Tavaborole 5 % SOLN, Apply topically., Disp: , Rfl:    tiZANidine  (ZANAFLEX ) 2 MG tablet, TAKE 1 TO 2 TABLETS BY MOUTH AT BEDTIME AS NEEDED FOR MUSCLE SPASMS, Disp: 360 tablet, Rfl: 2  Social History   Tobacco Use  Smoking Status Never   Passive exposure: Never  Smokeless Tobacco Never    No Known Allergies Objective:  There were no vitals filed for this visit. There is no height or weight on file to calculate BMI. Constitutional Well developed. Well nourished.  Vascular Dorsalis pedis pulses palpable bilaterally. Posterior tibial pulses palpable bilaterally. Capillary refill normal to all digits.  No cyanosis or clubbing noted. Pedal hair growth normal.  Neurologic Normal speech. Oriented to person, place, and time. Epicritic sensation to light touch grossly present bilaterally.  Dermatologic Nails well groomed and normal in appearance. No open wounds. No skin lesions.  Orthopedic: No further pain on palpation across the second third and fourth metatarsal base.  No further ecchymosis noted bruising noted palpable pulses no other abnormalities identified   Radiographs: Fractures at the base of the second, third and fourth metatarsals.   Assessment:  No diagnosis found. Plan:  Patient was evaluated and treated and all questions answered.  Left second third and fourth metatarsal fracture minimally displaced -All questions and concerns were discussed with the patient in extensive detail - Clinically healed and officially discharged from my care if any foot and ankle issues or in the future she will come back and see me.  Discussed with her to return to regular activities in regular shoes she states understanding  No follow-ups on file.

## 2023-07-06 ENCOUNTER — Encounter: Payer: Self-pay | Admitting: *Deleted

## 2023-07-06 ENCOUNTER — Other Ambulatory Visit: Payer: Self-pay | Admitting: *Deleted

## 2023-07-06 NOTE — Patient Outreach (Addendum)
 Complex Care Management   Visit Note  07/06/2023  Name:  Joyce Brown MRN: [id number] DOB: 1950/11/21  Situation: Referral received for Complex Care Management related to SDOH Barriers:  Housing patient requesting assistance with identifying an assisted living placement  Patient has toured and is considering placement at [facility]. I obtained verbal consent from Patient.  Visit completed with patient  on the phone  Background:   Past Medical History:  Diagnosis Date   Arthritis 1995   Have family history of it.   Asthma 1995   Has resolved   Cataract 2017   Family History of them   Cerebellar ataxia (HCC)    Depression 1996   Due to Fibromyalgia.   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   Glaucoma Various   Family History of it.   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    Assessment: Patient Reported Symptoms:  Cognitive Cognitive Status: Alert and oriented to person, place, and time, Insightful and able to interpret abstract concepts, Normal speech and language skills Cognitive/Intellectual Conditions Management [RPT]: None reported or documented in medical history or problem list   Health Maintenance Behaviors: Annual physical exam, Immunizations, Sleep adequate Healing Pattern: Average Health Facilitated by: Healthy diet  Neurological  Neurological Review of Symptoms: Hearing changes (tinitus, wears hearing aids) Neurological Conditions:  (cerebella ataxia) Neurological Management Strategies: Routine screening, Medication therapy, Diet modification, Adequate rest Neurological Self-Management Outcome: 4 (good)  HEENT HEENT Symptoms Reported: Not assessed      Cardiovascular Cardiovascular Symptoms Reported: No symptoms reported Does patient have uncontrolled Hypertension?: No Cardiovascular Conditions: Hypertension Cardiovascular Management Strategies: Medication therapy, Routine screening Weight: 169 lb (76.7 kg) Cardiovascular Self-Management Outcome: 4 (good)  Respiratory Respiratory Symptoms Reported: Not assesed    Endocrine Patient reports the following symptoms related to hypoglycemia or hyperglycemia : Not assessed    Gastrointestinal Gastrointestinal Symptoms Reported: Not assessed      Genitourinary Genitourinary Symptoms Reported: Not assessed    Integumentary Integumentary Symptoms Reported: Not assessed    Musculoskeletal Musculoskelatal Symptoms Reviewed: Not assessed        Psychosocial Psychosocial Symptoms Reported: No symptoms reported     Quality of Family Relationships: supportive Do you feel physically threatened by others?: No      06/30/2023   10:29 AM  Depression screen PHQ 2/9  Decreased Interest 0  Down, Depressed, Hopeless 0  PHQ - 2 Score 0     There were no vitals filed for this visit.  Medications Reviewed Today     Reviewed by Ave Leisure, LCSW (Social Worker) on 07/06/23 at 2124  Med List Status: <None>   Medication Order Taking? Sig Documenting Provider Last Dose Status Informant  Acetaminophen  500 MG capsule [id number] Yes Take 500 mg by mouth every 6 (six) hours as needed for moderate pain. [provider]  Taking Active Self  antiseptic oral rinse (BIOTENE) LIQD [id number] Yes 15 mLs by Mouth Rinse route in the morning, at noon, in the evening, and at  bedtime. [provider] Taking Active Self  atorvastatin  (LIPITOR) 80 MG tablet [id number] Yes TAKE 1 TABLET (80 MG TOTAL) BY MOUTH DAILY. Ostwalt, Janna, PA-C Taking Active   BD INSULIN  SYRINGE U/F 1/2UNIT 31G X 5/16" 0.3 ML MISC [id number] Yes USE 3 TIMES A DAY Shamleffer, Julian Obey, MD Taking Active Self  carboxymethylcellulose (REFRESH PLUS) 0.5 % SOLN [id number]  Place 1 drop into both eyes 3 (three) times daily as needed. [provider]  Active Self  Cholecalciferol (VITAMIN D3) 1000 units CAPS [id number] Yes Take by mouth. [provider] Taking Active   Continuous Blood Gluc Sensor (DEXCOM G7 SENSOR) MISC [id number] Yes by Does not apply route. [provider] Taking Active Self  Cyanocobalamin  (VITAMIN B-12 PO) [id number] Yes Take by mouth. [provider] Taking Active   docusate sodium (COLACE) 100 MG capsule [id number]  Take 100 mg by mouth daily as needed for mild constipation. [provider]  Active Self  estradiol (ESTRACE) 0.1 MG/GM vaginal cream [id number] Yes Place 1 Applicatorful vaginally 2 (two) times a week. [provider] Taking Active Self           Med Note Mallie Seal Jan 11, 2023  1:14 PM) Takes 0.5gm  Ferrous Sulfate (IRON PO) [id number] Yes Take by mouth. [provider] Taking Active   gabapentin  (NEURONTIN ) 100 MG capsule [id number] Yes Take 2 capsules (200 mg total) by mouth 3 (three) times daily. Simmons-Robinson, Makiera, MD Taking Active Self  Glucagon  (BAQSIMI  TWO PACK) 3 MG/DOSE POWD [id number] Yes Place 3 mg into the nose as needed (For hypoglycemia). Luna Salinas, MD Taking Active   glucose blood test strip [id number] Yes Use [provider] Taking Active Self  insulin  aspart (NOVOLOG  PENFILL) cartridge [id number] Yes USE MAX OF DAILY DOSE OF 60 UNITS PER CORRECTION SCALE AS DIRECTED  Patient taking differently: USE MAX OF DAILY DOSE OF 60 UNITS PER CORRECTION SCALE AS  DIRECTED Taking 8 units before breakfast; 10 units before lunch; 7 units before dinner   Shamleffer, Julian Obey, MD Taking Active Self  Insulin  Disposable Pump (OMNIPOD 5 DEXG7G6 PODS GEN 5) MISC [id number] Yes SMARTSIG:SUB-Q Every 3 Days [provider] Taking Active Self  Insulin  Pen Needle (DROPLET PEN NEEDLES) 31G X 5 MM MISC [id number] Yes USE 4 TIMES DAILY AS DIRECTED BY YOUR DOCTOR Shamleffer, Julian Obey, MD Taking Active Self  Microlet Lancets MISC [id number] Yes 3 (three) times daily. [provider] Taking Active Self  Omega 3 1200 MG CAPS [id number] Yes Take by mouth. One daily [provider] Taking Active Self  sertraline  (ZOLOFT ) 100 MG tablet [id number] Yes TAKE 1 TABLET EVERY DAY Simmons-Robinson, Makiera, MD Taking Active Self  Sodium Fluoride  1.1 % PSTE [id number] Yes Use 1 Application Nightly after brushing/flossing. Spit out excess. No rinse/eat/drink for at least 30 minutes after application  Taking Active Self  solifenacin  (VESICARE ) 10 MG tablet [id number] Yes Take 10 mg by mouth daily. [provider] Taking Active Self  Tavaborole 5 % SOLN [id number] Yes Apply topically. [provider] Taking Active Self  tiZANidine  (ZANAFLEX ) 2 MG tablet [id number] Yes TAKE 1 TO 2 TABLETS BY MOUTH AT BEDTIME AS NEEDED FOR MUSCLE SPASMS Simmons-Robinson, Makiera, MD Taking Active             Recommendation:  CSW to request completion of Fl2 when indicated   Follow Up Plan:   Telephone follow-up 07/21/23  Michaelle Adolphus, LCSW [place]  [facility], [facility] Licensed Clinical Social Worker Care Coordinator  Direct Dial: [phone]

## 2023-07-06 NOTE — Patient Instructions (Signed)
 Visit Information  Thank you for taking time to visit with me today. Please don't hesitate to contact me if I can be of assistance to you before our next scheduled appointment.  Your next care management appointment is by telephone on 07/21/23 at 11am  Telephone follow-up 07/21/23  Please call the care guide team at [phone] if you need to cancel, schedule, or reschedule an appointment.   Please call 911 if you are experiencing a Mental Health or Behavioral Health Crisis or need someone to talk to.  Makalia Bare, LCSW [place]  [facility], [facility] Licensed Clinical Social Worker Care Coordinator  Direct Dial: [phone]

## 2023-07-06 NOTE — Telephone Encounter (Signed)
 This encounter was created in error - please disregard.

## 2023-07-07 DIAGNOSIS — S92342D Displaced fracture of fourth metatarsal bone, left foot, subsequent encounter for fracture with routine healing: Secondary | ICD-10-CM | POA: Diagnosis not present

## 2023-07-07 DIAGNOSIS — E785 Hyperlipidemia, unspecified: Secondary | ICD-10-CM | POA: Diagnosis not present

## 2023-07-07 DIAGNOSIS — E10649 Type 1 diabetes mellitus with hypoglycemia without coma: Secondary | ICD-10-CM | POA: Diagnosis not present

## 2023-07-07 DIAGNOSIS — S92332D Displaced fracture of third metatarsal bone, left foot, subsequent encounter for fracture with routine healing: Secondary | ICD-10-CM | POA: Diagnosis not present

## 2023-07-07 DIAGNOSIS — E1042 Type 1 diabetes mellitus with diabetic polyneuropathy: Secondary | ICD-10-CM | POA: Diagnosis not present

## 2023-07-07 DIAGNOSIS — S92322D Displaced fracture of second metatarsal bone, left foot, subsequent encounter for fracture with routine healing: Secondary | ICD-10-CM | POA: Diagnosis not present

## 2023-07-08 ENCOUNTER — Other Ambulatory Visit: Payer: Self-pay

## 2023-07-08 ENCOUNTER — Other Ambulatory Visit

## 2023-07-08 NOTE — Patient Instructions (Signed)
 Visit Information  Thank you for taking time to visit with me today. Please don't hesitate to contact me if I can be of assistance to you before our next scheduled appointment.  Our next appointment is by telephone on 08/10/23 at 10 am Please call the care guide team at [phone] if you need to cancel or reschedule your appointment.   Following is a copy of your care plan:   Goals Addressed             This Visit's Progress    VBCI RN Care Plan- Diabetes       Problems:  Chronic Disease Management support and education needs related to DMII  Goal: Over the next 3 months the Patient will attend all scheduled medical appointments: with providers as evidenced by patient report/ chart review         continue to work with RN Care Manager and/or Social Worker to address care management and care coordination needs related to DMII as evidenced by adherence to CM Team Scheduled appointments     demonstrate Ongoing adherence to prescribed treatment plan for DMII as evidenced by patient report/ chart review.  take all medications exactly as prescribed and will call provider for medication related questions as evidenced by patient report/ chart review.      Interventions:   Diabetes Interventions: Assessed patient's understanding of A1c goal: <7% Reviewed medications with patient and discussed importance of medication adherence Discussed plans with patient for ongoing care management follow up and provided patient with direct contact information for care management team Reviewed scheduled/upcoming provider appointments including:   Advised to monitor blood sugar as recommended by provider Advised to schedule appointment with new endocrinologist at upcoming provider visit on 07/25/23.  Review patient current dietary meal choices and advised to eat low carbohydrate/ low salt meals, watch portion sizes and avoid sugar sweetened drinks.  Advised to notify provider for any new/ ongoing symptoms  and/or for frequent blood sugars <70 or >250.   Lab Results  Component Value Date   HGBA1C 9.1 (H) 04/11/2023    Patient Self-Care Activities:  Attend all scheduled provider appointments Call pharmacy for medication refills 3-7 days in advance of running out of medications Call provider office for new concerns or questions  Take medications as prescribed   check blood sugar at prescribed times: before meals and at bedtime and when you have symptoms of low or high blood sugar check feet daily for cuts, sores or redness take the blood sugar log to all doctor visits Notify provider for frequent blood sugars <70 or >250 Continue to follow a low carbohydrate/ low salt diet, watching portion sizes and avoiding sugar sweetened drinks.  Schedule appointment with new endocrinologist at upcoming provider appointment on 07/25/23.   Plan:  Telephone follow up appointment with care management team member scheduled for:  08/10/23 at 10 am          VBCI RN Care Plan-impaired mobility       Problems:  Chronic Disease Management support and education needs related to Impaired mobility/ cerebellar Ataxia/ left foot fracture Lacks caregiver support.   Goal: Over the next 3 months the Patient will attend all scheduled medical appointments: with providers as evidenced by patient report/ chart review        continue to work with RN Care Manager and/or Social Worker to address care management and care coordination needs related to impaired mobility as evidenced by adherence to CM Team Scheduled appointments  demonstrate Ongoing adherence to prescribed treatment plan for impaired mobility as evidenced by patient report/ chart review.  take all medications exactly as prescribed and will call provider for medication related questions as evidenced by patient report    Demonstrate decrease in falls Continue to work with home health PT/OT  Interventions: Immobility/ fallI interventions: Assessed for  falls since last encounter Advised patient to discuss any new or ongoing concerns/ symptoms with provider Assessed social determinant of health barriers Discussed assisted living option to meet ongoing health care needs. Fall prevention discussed Confirmed patient receiving home health PT/OT/SN and home health aid services Discussed recent podiatry visit involving left foot fracture.  Encouraged ongoing follow up with social worker regarding housing needs.  Advised to wear sturdy, non slip sneaker to help prevent falls.   Patient Self-Care Activities:  Attend all scheduled provider appointments Call pharmacy for medication refills 3-7 days in advance of running out of medications Call provider office for new concerns or questions  Take medications as prescribed   Continue to follow up with your neurologist and notify of any new or ongoing symptoms.  Wear sturdy, non slip sneaker to help prevent falls.   Plan:  Telephone follow up appointment with care management team member scheduled for:  08/10/23 at 10am              Please call the Suicide and Crisis Lifeline: 988 call 1-800-273-TALK (toll free, 24 hour hotline) if you are experiencing a Mental Health or Behavioral Health Crisis or need someone to talk to.  Patient verbalizes understanding of instructions and care plan provided today and agrees to view in MyChart. Active MyChart status and patient understanding of how to access instructions and care plan via MyChart confirmed with patient.     Verba Girt RN, BSN, CCM [employer], Population Health Case Manager Phone: [phone]

## 2023-07-11 DIAGNOSIS — S92332D Displaced fracture of third metatarsal bone, left foot, subsequent encounter for fracture with routine healing: Secondary | ICD-10-CM | POA: Diagnosis not present

## 2023-07-11 DIAGNOSIS — S92322D Displaced fracture of second metatarsal bone, left foot, subsequent encounter for fracture with routine healing: Secondary | ICD-10-CM | POA: Diagnosis not present

## 2023-07-11 DIAGNOSIS — E10649 Type 1 diabetes mellitus with hypoglycemia without coma: Secondary | ICD-10-CM | POA: Diagnosis not present

## 2023-07-11 DIAGNOSIS — E785 Hyperlipidemia, unspecified: Secondary | ICD-10-CM | POA: Diagnosis not present

## 2023-07-11 DIAGNOSIS — S92342D Displaced fracture of fourth metatarsal bone, left foot, subsequent encounter for fracture with routine healing: Secondary | ICD-10-CM | POA: Diagnosis not present

## 2023-07-11 DIAGNOSIS — E1042 Type 1 diabetes mellitus with diabetic polyneuropathy: Secondary | ICD-10-CM | POA: Diagnosis not present

## 2023-07-13 DIAGNOSIS — E785 Hyperlipidemia, unspecified: Secondary | ICD-10-CM | POA: Diagnosis not present

## 2023-07-13 DIAGNOSIS — E1042 Type 1 diabetes mellitus with diabetic polyneuropathy: Secondary | ICD-10-CM | POA: Diagnosis not present

## 2023-07-13 DIAGNOSIS — S92322D Displaced fracture of second metatarsal bone, left foot, subsequent encounter for fracture with routine healing: Secondary | ICD-10-CM | POA: Diagnosis not present

## 2023-07-13 DIAGNOSIS — E10649 Type 1 diabetes mellitus with hypoglycemia without coma: Secondary | ICD-10-CM | POA: Diagnosis not present

## 2023-07-13 DIAGNOSIS — S92332D Displaced fracture of third metatarsal bone, left foot, subsequent encounter for fracture with routine healing: Secondary | ICD-10-CM | POA: Diagnosis not present

## 2023-07-13 DIAGNOSIS — S92342D Displaced fracture of fourth metatarsal bone, left foot, subsequent encounter for fracture with routine healing: Secondary | ICD-10-CM | POA: Diagnosis not present

## 2023-07-14 DIAGNOSIS — G8929 Other chronic pain: Secondary | ICD-10-CM | POA: Diagnosis not present

## 2023-07-14 DIAGNOSIS — M4800 Spinal stenosis, site unspecified: Secondary | ICD-10-CM | POA: Diagnosis not present

## 2023-07-14 DIAGNOSIS — S92322D Displaced fracture of second metatarsal bone, left foot, subsequent encounter for fracture with routine healing: Secondary | ICD-10-CM | POA: Diagnosis not present

## 2023-07-14 DIAGNOSIS — S92332D Displaced fracture of third metatarsal bone, left foot, subsequent encounter for fracture with routine healing: Secondary | ICD-10-CM | POA: Diagnosis not present

## 2023-07-14 DIAGNOSIS — F4323 Adjustment disorder with mixed anxiety and depressed mood: Secondary | ICD-10-CM | POA: Diagnosis not present

## 2023-07-14 DIAGNOSIS — Z9181 History of falling: Secondary | ICD-10-CM | POA: Diagnosis not present

## 2023-07-14 DIAGNOSIS — I700 Atherosclerosis of aorta: Secondary | ICD-10-CM | POA: Diagnosis not present

## 2023-07-14 DIAGNOSIS — S92342D Displaced fracture of fourth metatarsal bone, left foot, subsequent encounter for fracture with routine healing: Secondary | ICD-10-CM | POA: Diagnosis not present

## 2023-07-14 DIAGNOSIS — E785 Hyperlipidemia, unspecified: Secondary | ICD-10-CM | POA: Diagnosis not present

## 2023-07-14 DIAGNOSIS — Z90710 Acquired absence of both cervix and uterus: Secondary | ICD-10-CM | POA: Diagnosis not present

## 2023-07-14 DIAGNOSIS — Z86711 Personal history of pulmonary embolism: Secondary | ICD-10-CM | POA: Diagnosis not present

## 2023-07-14 DIAGNOSIS — E10649 Type 1 diabetes mellitus with hypoglycemia without coma: Secondary | ICD-10-CM | POA: Diagnosis not present

## 2023-07-14 DIAGNOSIS — M797 Fibromyalgia: Secondary | ICD-10-CM | POA: Diagnosis not present

## 2023-07-14 DIAGNOSIS — K219 Gastro-esophageal reflux disease without esophagitis: Secondary | ICD-10-CM | POA: Diagnosis not present

## 2023-07-14 DIAGNOSIS — J45909 Unspecified asthma, uncomplicated: Secondary | ICD-10-CM | POA: Diagnosis not present

## 2023-07-14 DIAGNOSIS — G4730 Sleep apnea, unspecified: Secondary | ICD-10-CM | POA: Diagnosis not present

## 2023-07-14 DIAGNOSIS — D72829 Elevated white blood cell count, unspecified: Secondary | ICD-10-CM | POA: Diagnosis not present

## 2023-07-14 DIAGNOSIS — M545 Low back pain, unspecified: Secondary | ICD-10-CM | POA: Diagnosis not present

## 2023-07-14 DIAGNOSIS — I10 Essential (primary) hypertension: Secondary | ICD-10-CM | POA: Diagnosis not present

## 2023-07-14 DIAGNOSIS — H9193 Unspecified hearing loss, bilateral: Secondary | ICD-10-CM | POA: Diagnosis not present

## 2023-07-14 DIAGNOSIS — E1042 Type 1 diabetes mellitus with diabetic polyneuropathy: Secondary | ICD-10-CM | POA: Diagnosis not present

## 2023-07-14 DIAGNOSIS — M1990 Unspecified osteoarthritis, unspecified site: Secondary | ICD-10-CM | POA: Diagnosis not present

## 2023-07-14 DIAGNOSIS — Z9089 Acquired absence of other organs: Secondary | ICD-10-CM | POA: Diagnosis not present

## 2023-07-15 DIAGNOSIS — E785 Hyperlipidemia, unspecified: Secondary | ICD-10-CM | POA: Diagnosis not present

## 2023-07-15 DIAGNOSIS — E1042 Type 1 diabetes mellitus with diabetic polyneuropathy: Secondary | ICD-10-CM | POA: Diagnosis not present

## 2023-07-15 DIAGNOSIS — S92332D Displaced fracture of third metatarsal bone, left foot, subsequent encounter for fracture with routine healing: Secondary | ICD-10-CM | POA: Diagnosis not present

## 2023-07-15 DIAGNOSIS — S92322D Displaced fracture of second metatarsal bone, left foot, subsequent encounter for fracture with routine healing: Secondary | ICD-10-CM | POA: Diagnosis not present

## 2023-07-15 DIAGNOSIS — S92342D Displaced fracture of fourth metatarsal bone, left foot, subsequent encounter for fracture with routine healing: Secondary | ICD-10-CM | POA: Diagnosis not present

## 2023-07-15 DIAGNOSIS — E10649 Type 1 diabetes mellitus with hypoglycemia without coma: Secondary | ICD-10-CM | POA: Diagnosis not present

## 2023-07-18 ENCOUNTER — Telehealth: Payer: Self-pay

## 2023-07-18 DIAGNOSIS — S92322D Displaced fracture of second metatarsal bone, left foot, subsequent encounter for fracture with routine healing: Secondary | ICD-10-CM | POA: Diagnosis not present

## 2023-07-18 DIAGNOSIS — E785 Hyperlipidemia, unspecified: Secondary | ICD-10-CM | POA: Diagnosis not present

## 2023-07-18 DIAGNOSIS — S92332D Displaced fracture of third metatarsal bone, left foot, subsequent encounter for fracture with routine healing: Secondary | ICD-10-CM | POA: Diagnosis not present

## 2023-07-18 DIAGNOSIS — S92342D Displaced fracture of fourth metatarsal bone, left foot, subsequent encounter for fracture with routine healing: Secondary | ICD-10-CM | POA: Diagnosis not present

## 2023-07-18 DIAGNOSIS — E1042 Type 1 diabetes mellitus with diabetic polyneuropathy: Secondary | ICD-10-CM | POA: Diagnosis not present

## 2023-07-18 DIAGNOSIS — E10649 Type 1 diabetes mellitus with hypoglycemia without coma: Secondary | ICD-10-CM | POA: Diagnosis not present

## 2023-07-18 NOTE — Telephone Encounter (Signed)
 Bonnie from [facility] called and left a message - asking for the patient's weight bearing status

## 2023-07-19 DIAGNOSIS — E785 Hyperlipidemia, unspecified: Secondary | ICD-10-CM | POA: Diagnosis not present

## 2023-07-19 DIAGNOSIS — S92322D Displaced fracture of second metatarsal bone, left foot, subsequent encounter for fracture with routine healing: Secondary | ICD-10-CM | POA: Diagnosis not present

## 2023-07-19 DIAGNOSIS — S92332D Displaced fracture of third metatarsal bone, left foot, subsequent encounter for fracture with routine healing: Secondary | ICD-10-CM | POA: Diagnosis not present

## 2023-07-19 DIAGNOSIS — E1042 Type 1 diabetes mellitus with diabetic polyneuropathy: Secondary | ICD-10-CM | POA: Diagnosis not present

## 2023-07-19 DIAGNOSIS — E10649 Type 1 diabetes mellitus with hypoglycemia without coma: Secondary | ICD-10-CM | POA: Diagnosis not present

## 2023-07-19 DIAGNOSIS — S92342D Displaced fracture of fourth metatarsal bone, left foot, subsequent encounter for fracture with routine healing: Secondary | ICD-10-CM | POA: Diagnosis not present

## 2023-07-21 ENCOUNTER — Other Ambulatory Visit: Payer: Self-pay | Admitting: *Deleted

## 2023-07-21 NOTE — Patient Outreach (Addendum)
 Complex Care Management   Visit Note  08/15/2023  Name:  Joyce Brown MRN: [id number] DOB: Nov 01, 1950  Situation: Referral received for Complex Care Management related to SDOH Barriers:  Housing -Assisted Living Placement I obtained verbal consent from Patient.  Visit completed with patient  on the phone  Background:   Past Medical History:  Diagnosis Date   Arthritis 1995   Have family history of it.   Asthma 1995   Has resolved   Cataract 2017   Family History of them   Cerebellar ataxia (HCC)    Depression 1996   Due to Fibromyalgia.   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   Glaucoma Various   Family History of it.   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    Assessment: Patient Reported Symptoms:  Cognitive Cognitive Status: Alert and oriented to person, place, and time, Insightful and able to interpret abstract concepts, Normal speech and language skills Cognitive/Intellectual Conditions Management [RPT]: None reported or documented in medical history or problem list      Neurological Neurological Review of Symptoms: No symptoms reported    HEENT HEENT Symptoms Reported: Not assessed      Cardiovascular Cardiovascular Symptoms Reported: Not assessed    Respiratory Respiratory Symptoms Reported: Not assesed    Endocrine Patient reports the  following symptoms related to hypoglycemia or hyperglycemia : No symptoms reported Is patient diabetic?: Yes Is patient checking blood sugars at home?: Yes Endocrine Conditions: Diabetes Endocrine Comment: Endocrinologist visit on 07/25/23-will discuss next provider as current provider is retiring  Gastrointestinal        Genitourinary Genitourinary Symptoms Reported: No symptoms reported    Integumentary Integumentary Symptoms Reported: Other Other Integumentary Symptoms: fungus under nail- Skin Management Strategies: Medication therapy Skin Self-Management Outcome: 4 (good) Skin Comment: Dr. Aris Bel dermatology in [place] on 07/25/23  Musculoskeletal Musculoskelatal Symptoms Reviewed: Difficulty walking Additional Musculoskeletal Details: left foot fracture Musculoskeletal Conditions: Other Other Musculoskeletal Conditions: left foot fracture Musculoskeletal Management Strategies: Routine screening Musculoskeletal Comment: painful bump on top of her foot, patient agreeable to call provider to schedule appointment to be seen Falls in the past year?: Yes Number of falls in past year: 2 or more Was there an injury with Fall?: Yes Fall Risk Category Calculator: 3 Patient Fall Risk Level: High Fall Risk Patient at Risk for Falls Due to: History of fall(s) Fall risk Follow up: Falls prevention discussed  Psychosocial Psychosocial Symptoms Reported: No symptoms reported            06/30/2023   10:29 AM  Depression screen PHQ 2/9  Decreased Interest 0  Down, Depressed, Hopeless 0  PHQ - 2 Score 0    There were no vitals filed for this visit.  Medications Reviewed Today     Reviewed by Ave Leisure, LCSW (Social Worker) on 07/21/23 at  1127  Med List Status: <None>   Medication Order Taking? Sig Documenting Provider Last Dose Status Informant  Acetaminophen  500 MG capsule [id number] Yes Take 500 mg by mouth every 6 (six) hours as needed for moderate pain. [provider] Taking Active Self  antiseptic oral rinse (BIOTENE) LIQD [id number] Yes 15 mLs by Mouth Rinse route in the morning, at noon, in the evening, and at bedtime. [provider] Taking Active Self  atorvastatin  (LIPITOR) 80 MG tablet [id number] Yes TAKE 1 TABLET (80 MG TOTAL) BY MOUTH DAILY. Ostwalt, Janna, PA-C Taking Active   BD INSULIN  SYRINGE U/F 1/2UNIT 31G X 5/16" 0.3 ML MISC [id number] Yes USE 3 TIMES A DAY Shamleffer, Julian Obey, MD Taking Active Self  carboxymethylcellulose (REFRESH PLUS) 0.5 % SOLN [id number] Yes Place 1 drop into both eyes 3 (three) times daily as needed. [provider] Taking Active Self  Cholecalciferol (VITAMIN D3) 1000 units CAPS [id number] Yes Take by mouth. [provider] Taking Active   Clobetasol Propionate (IMPOYZ) 0.025 % CREA [id number]  Apply topically. Patient states applys 1 x per day. [provider]  Active Self  Continuous Blood Gluc Sensor (DEXCOM G7 SENSOR) MISC [id number]  by Does not apply route. [provider]  Active Self  Cyanocobalamin  (VITAMIN B-12 PO) [id number] Yes Take by mouth. [provider] Taking Active   docusate sodium (COLACE) 100 MG capsule [id number] Yes Take 100 mg by mouth daily as needed for mild constipation. [provider] Taking Active Self  estradiol (ESTRACE) 0.1 MG/GM vaginal cream [id number] Yes Place 1 Applicatorful vaginally 2 (two) times a week. [provider] Taking Active Self           Med Note Mallie Seal Jan 11, 2023  1:14 PM) Takes 0.5gm  Ferrous Sulfate (IRON PO) [id number] Yes Take by mouth. [provider] Taking Active   gabapentin  (NEURONTIN ) 100 MG capsule [id number] Yes Take 2 capsules (200 mg total) by mouth 3 (three) times daily. Simmons-Robinson, Makiera, MD Taking Active Self  Glucagon  (BAQSIMI  TWO PACK) 3 MG/DOSE POWD [id number] Yes Place 3 mg into the nose as needed (For hypoglycemia). Luna Salinas, MD  Taking Active   glucose blood test strip [id number] Yes Use [provider] Taking Active Self  insulin  aspart (NOVOLOG  PENFILL) cartridge [id number] Yes USE MAX OF DAILY DOSE OF 60 UNITS PER CORRECTION SCALE AS DIRECTED  Patient taking differently: USE MAX OF DAILY DOSE OF 60 UNITS PER CORRECTION SCALE AS DIRECTED Taking 8 units before breakfast; 10 units before lunch; 7 units before dinner   Shamleffer, Julian Obey, MD Taking Active Self  Insulin  Disposable Pump (OMNIPOD 5 DEXG7G6 PODS GEN 5) MISC [id number] Yes SMARTSIG:SUB-Q Every 3 Days [provider] Taking Active Self  Insulin  Pen Needle (DROPLET PEN NEEDLES) 31G X 5 MM MISC [id number] Yes USE 4 TIMES DAILY AS DIRECTED BY YOUR DOCTOR Shamleffer, Julian Obey, MD Taking Active Self  Microlet Lancets MISC [id number] Yes 3 (three) times daily. [provider] Taking Active Self  Omega 3 1200 MG CAPS [id number] Yes Take by mouth. One daily [provider] Taking Active Self  sertraline  (ZOLOFT ) 100 MG tablet [id number] Yes TAKE 1 TABLET EVERY DAY Simmons-Robinson, Makiera, MD Taking Active Self  Sodium Fluoride  1.1 % PSTE [id number] Yes Use 1 Application Nightly after brushing/flossing. Spit out excess. No rinse/eat/drink for at least 30 minutes after application  Taking Active Self  solifenacin  (VESICARE ) 10 MG tablet [id number] Yes Take 10 mg  by mouth daily. [provider] Taking Active Self  Tavaborole 5 % SOLN [id number] Yes Apply topically. [provider] Taking Active Self  tiZANidine  (ZANAFLEX ) 2 MG tablet [id number] Yes TAKE 1 TO 2 TABLETS BY MOUTH AT BEDTIME AS NEEDED FOR MUSCLE SPASMS Mimi Alt, MD Taking Active             Recommendation:   PCP Follow-up 10/06/23 Endocrinologist 07/25/23 Dermatology 07/25/23  Follow Up Plan:   Telephone follow-up 08/10/23  Michaelle Adolphus, LCSW [place]  [facility], [facility] Licensed Clinical  Social Worker Care Coordinator  Direct Dial: [phone]

## 2023-07-21 NOTE — Patient Instructions (Signed)
 Visit Information  Thank you for taking time to visit with me today. Please don't hesitate to contact me if I can be of assistance to you before our next scheduled appointment.  Your next care management appointment is by telephone on 08/15/23 at 11am  Telephone follow up appointment date/time:  08/15/23  Please call the care guide team at [phone] if you need to cancel, schedule, or reschedule an appointment.   Please call 911 if you are experiencing a Mental Health or Behavioral Health Crisis or need someone to talk to.  Autumn Pruitt, LCSW [place]  [facility], [facility] Licensed Clinical Social Worker Care Coordinator  Direct Dial: [phone]

## 2023-07-22 ENCOUNTER — Telehealth: Payer: Self-pay

## 2023-07-22 ENCOUNTER — Encounter (HOSPITAL_COMMUNITY): Payer: Self-pay

## 2023-07-22 NOTE — Telephone Encounter (Signed)
 DM shoes put in box to be sent to [facility] on 07/22/23

## 2023-07-25 DIAGNOSIS — E10649 Type 1 diabetes mellitus with hypoglycemia without coma: Secondary | ICD-10-CM | POA: Diagnosis not present

## 2023-07-25 DIAGNOSIS — S92322D Displaced fracture of second metatarsal bone, left foot, subsequent encounter for fracture with routine healing: Secondary | ICD-10-CM | POA: Diagnosis not present

## 2023-07-25 DIAGNOSIS — Z9641 Presence of insulin pump (external) (internal): Secondary | ICD-10-CM | POA: Diagnosis not present

## 2023-07-25 DIAGNOSIS — Z9884 Bariatric surgery status: Secondary | ICD-10-CM | POA: Diagnosis not present

## 2023-07-25 DIAGNOSIS — S92332D Displaced fracture of third metatarsal bone, left foot, subsequent encounter for fracture with routine healing: Secondary | ICD-10-CM | POA: Diagnosis not present

## 2023-07-25 DIAGNOSIS — E1065 Type 1 diabetes mellitus with hyperglycemia: Secondary | ICD-10-CM | POA: Diagnosis not present

## 2023-07-25 DIAGNOSIS — S92342D Displaced fracture of fourth metatarsal bone, left foot, subsequent encounter for fracture with routine healing: Secondary | ICD-10-CM | POA: Diagnosis not present

## 2023-07-25 DIAGNOSIS — E785 Hyperlipidemia, unspecified: Secondary | ICD-10-CM | POA: Diagnosis not present

## 2023-07-25 DIAGNOSIS — E1042 Type 1 diabetes mellitus with diabetic polyneuropathy: Secondary | ICD-10-CM | POA: Diagnosis not present

## 2023-07-26 ENCOUNTER — Telehealth: Payer: Self-pay

## 2023-07-26 DIAGNOSIS — E1042 Type 1 diabetes mellitus with diabetic polyneuropathy: Secondary | ICD-10-CM | POA: Diagnosis not present

## 2023-07-26 DIAGNOSIS — S92332D Displaced fracture of third metatarsal bone, left foot, subsequent encounter for fracture with routine healing: Secondary | ICD-10-CM | POA: Diagnosis not present

## 2023-07-26 DIAGNOSIS — E785 Hyperlipidemia, unspecified: Secondary | ICD-10-CM | POA: Diagnosis not present

## 2023-07-26 DIAGNOSIS — S92322D Displaced fracture of second metatarsal bone, left foot, subsequent encounter for fracture with routine healing: Secondary | ICD-10-CM | POA: Diagnosis not present

## 2023-07-26 DIAGNOSIS — S92342D Displaced fracture of fourth metatarsal bone, left foot, subsequent encounter for fracture with routine healing: Secondary | ICD-10-CM | POA: Diagnosis not present

## 2023-07-26 DIAGNOSIS — E10649 Type 1 diabetes mellitus with hypoglycemia without coma: Secondary | ICD-10-CM | POA: Diagnosis not present

## 2023-07-26 NOTE — Telephone Encounter (Signed)
 LVM to resched; appt made on the wrong provider type/ visit type

## 2023-07-28 DIAGNOSIS — B351 Tinea unguium: Secondary | ICD-10-CM | POA: Diagnosis not present

## 2023-07-28 DIAGNOSIS — S92322D Displaced fracture of second metatarsal bone, left foot, subsequent encounter for fracture with routine healing: Secondary | ICD-10-CM | POA: Diagnosis not present

## 2023-07-28 DIAGNOSIS — E1042 Type 1 diabetes mellitus with diabetic polyneuropathy: Secondary | ICD-10-CM | POA: Diagnosis not present

## 2023-07-28 DIAGNOSIS — E785 Hyperlipidemia, unspecified: Secondary | ICD-10-CM | POA: Diagnosis not present

## 2023-07-28 DIAGNOSIS — S92332D Displaced fracture of third metatarsal bone, left foot, subsequent encounter for fracture with routine healing: Secondary | ICD-10-CM | POA: Diagnosis not present

## 2023-07-28 DIAGNOSIS — E10649 Type 1 diabetes mellitus with hypoglycemia without coma: Secondary | ICD-10-CM | POA: Diagnosis not present

## 2023-07-28 DIAGNOSIS — S92342D Displaced fracture of fourth metatarsal bone, left foot, subsequent encounter for fracture with routine healing: Secondary | ICD-10-CM | POA: Diagnosis not present

## 2023-07-29 ENCOUNTER — Encounter: Payer: Self-pay | Admitting: Family Medicine

## 2023-07-29 ENCOUNTER — Ambulatory Visit (INDEPENDENT_AMBULATORY_CARE_PROVIDER_SITE_OTHER): Admitting: Family Medicine

## 2023-07-29 VITALS — BP 118/64 | HR 62 | Temp 97.9°F | Resp 16 | Wt 148.9 lb

## 2023-07-29 DIAGNOSIS — M79672 Pain in left foot: Secondary | ICD-10-CM | POA: Diagnosis not present

## 2023-07-29 DIAGNOSIS — E1042 Type 1 diabetes mellitus with diabetic polyneuropathy: Secondary | ICD-10-CM | POA: Diagnosis not present

## 2023-07-29 NOTE — Patient Instructions (Addendum)
 Please review the attached list of medications and notify my office if there are any errors.   Go to the [facility] on [address] for foot Xray

## 2023-07-29 NOTE — Progress Notes (Signed)
 Established patient visit   Patient: Joyce Brown   DOB: 02-06-1951   73 y.o. Female  MRN: [id number] Visit Date: 07/29/2023  Today's healthcare provider: Jeralene Mom, MD   Chief Complaint  Patient presents with   Foot Pain    L foot pain, fractured foot back in march.   Subjective    Discussed the use of AI scribe software for clinical note transcription with the patient, who gave verbal consent to proceed.  History of Present Illness   Joyce Brown is a 73 year old female who presents with foot pain and swelling following a previous fracture in March.  She experiences persistent pain across the top of her foot, particularly where her sneaker hits, and notes swelling under her foot when weight-bearing. The pain intensifies about an hour after physical therapy exercises. She uses impression footies and applies an ice pack to manage the swelling and pain. Her last physical therapy session was over a week ago.  She initially injured her foot and had an x-ray taken in the ER, but has not had any follow-up imaging since. She notes the foot was smooth before the injury and has noticed a bump since.  She was supposed to have a bone density test in January but was unable to due to a low blood sugar episode that required an ER visit. The bone density test has been rescheduled for next month.  She relies on [employer] for transportation.      Medications: Outpatient Medications Prior to Visit  Medication Sig   Acetaminophen  500 MG capsule Take 500 mg by mouth every 6 (six) hours as needed for moderate pain.   antiseptic oral rinse (BIOTENE) LIQD 15 mLs by Mouth Rinse route in the morning, at noon, in the evening, and at bedtime.   atorvastatin  (LIPITOR) 80 MG tablet TAKE 1 TABLET (80 MG TOTAL) BY MOUTH DAILY.   BD INSULIN  SYRINGE U/F 1/2UNIT 31G X 5/16" 0.3 ML MISC USE 3 TIMES A DAY   carboxymethylcellulose (REFRESH PLUS) 0.5 % SOLN Place 1 drop into both eyes 3  (three) times daily as needed.   Cholecalciferol (VITAMIN D3) 1000 units CAPS Take by mouth.   Clobetasol Propionate (IMPOYZ) 0.025 % CREA Apply topically. Patient states applys 1 x per day.   Continuous Blood Gluc Sensor (DEXCOM G7 SENSOR) MISC by Does not apply route.   Cyanocobalamin  (VITAMIN B-12 PO) Take by mouth.   docusate sodium (COLACE) 100 MG capsule Take 100 mg by mouth daily as needed for mild constipation.   estradiol (ESTRACE) 0.1 MG/GM vaginal cream Place 1 Applicatorful vaginally 2 (two) times a week.   Ferrous Sulfate (IRON PO) Take by mouth.   gabapentin  (NEURONTIN ) 100 MG capsule Take 2 capsules (200 mg total) by mouth 3 (three) times daily.   Glucagon  (BAQSIMI  TWO PACK) 3 MG/DOSE POWD Place 3 mg into the nose as needed (For hypoglycemia).   glucose blood test strip Use   insulin  aspart (NOVOLOG  PENFILL) cartridge USE MAX OF DAILY DOSE OF 60 UNITS PER CORRECTION SCALE AS DIRECTED (Patient taking differently: USE MAX OF DAILY DOSE OF 60 UNITS PER CORRECTION SCALE AS DIRECTED Taking 8 units before breakfast; 10 units before lunch; 7 units before dinner)   Insulin  Disposable Pump (OMNIPOD 5 DEXG7G6 PODS GEN 5) MISC SMARTSIG:SUB-Q Every 3 Days   Insulin  Pen Needle (DROPLET PEN NEEDLES) 31G X 5 MM MISC USE 4 TIMES DAILY AS DIRECTED BY YOUR DOCTOR   Microlet Lancets  MISC 3 (three) times daily.   Omega 3 1200 MG CAPS Take by mouth. One daily   sertraline  (ZOLOFT ) 100 MG tablet TAKE 1 TABLET EVERY DAY   Sodium Fluoride  1.1 % PSTE Use 1 Application Nightly after brushing/flossing. Spit out excess. No rinse/eat/drink for at least 30 minutes after application   solifenacin  (VESICARE ) 10 MG tablet Take 10 mg by mouth daily.   Tavaborole 5 % SOLN Apply topically.   tiZANidine  (ZANAFLEX ) 2 MG tablet TAKE 1 TO 2 TABLETS BY MOUTH AT BEDTIME AS NEEDED FOR MUSCLE SPASMS   No facility-administered medications prior to visit.      Objective    BP 118/64 (BP Location: Left Arm, Patient  Position: Sitting, Cuff Size: Large)   Pulse 62   Temp 97.9 F (36.6 C) (Oral)   Resp 16   Wt 148 lb 14.4 oz (67.5 kg)   SpO2 98%   BMI 21.36 kg/m   Physical Exam   Tender dorsum of left midfoot. Slightly swollen, no erythema. No other gross deformities.        Assessment & Plan       Foot pain and swelling Persistent pain and swelling across the top of the foot, exacerbated by weight-bearing activities. Differential includes arthritis. Further evaluation needed. - Order follow-up x-ray at [facility] to assess healing. - Advise continued use of ice packs for swelling. - Discuss potential need for orthopedic consultation if x-ray indicates inadequate healing.  Foot fracture Initial x-rays taken at time of injury March. No follow-up imaging conducted. Healing process needs evaluation to ensure proper recovery.  General Health Maintenance Bone density test delayed due to hypoglycemia. Rescheduled for next month. Discussed importance for fracture healing and prevention. - Ensure bone density test is completed as rescheduled.          Jeralene Mom, MD  [facility] [phone] (phone) [phone] (fax)  [facility]

## 2023-08-01 ENCOUNTER — Ambulatory Visit
Admission: RE | Admit: 2023-08-01 | Discharge: 2023-08-01 | Disposition: A | Discharge status: Home / Self Care | Source: Ambulatory Visit | Attending: Family Medicine | Admitting: Family Medicine

## 2023-08-01 ENCOUNTER — Ambulatory Visit
Admission: RE | Admit: 2023-08-01 | Discharge: 2023-08-01 | Disposition: A | Discharge status: Home / Self Care | Attending: Family Medicine | Admitting: Family Medicine

## 2023-08-01 DIAGNOSIS — E785 Hyperlipidemia, unspecified: Secondary | ICD-10-CM | POA: Diagnosis not present

## 2023-08-01 DIAGNOSIS — S92902D Unspecified fracture of left foot, subsequent encounter for fracture with routine healing: Secondary | ICD-10-CM | POA: Diagnosis not present

## 2023-08-01 DIAGNOSIS — M2042 Other hammer toe(s) (acquired), left foot: Secondary | ICD-10-CM | POA: Diagnosis not present

## 2023-08-01 DIAGNOSIS — S92332D Displaced fracture of third metatarsal bone, left foot, subsequent encounter for fracture with routine healing: Secondary | ICD-10-CM | POA: Diagnosis not present

## 2023-08-01 DIAGNOSIS — S92322D Displaced fracture of second metatarsal bone, left foot, subsequent encounter for fracture with routine healing: Secondary | ICD-10-CM | POA: Diagnosis not present

## 2023-08-01 DIAGNOSIS — S92325D Nondisplaced fracture of second metatarsal bone, left foot, subsequent encounter for fracture with routine healing: Secondary | ICD-10-CM | POA: Diagnosis not present

## 2023-08-01 DIAGNOSIS — S92342D Displaced fracture of fourth metatarsal bone, left foot, subsequent encounter for fracture with routine healing: Secondary | ICD-10-CM | POA: Diagnosis not present

## 2023-08-01 DIAGNOSIS — M79672 Pain in left foot: Secondary | ICD-10-CM | POA: Insufficient documentation

## 2023-08-01 DIAGNOSIS — E10649 Type 1 diabetes mellitus with hypoglycemia without coma: Secondary | ICD-10-CM | POA: Diagnosis not present

## 2023-08-01 DIAGNOSIS — E1042 Type 1 diabetes mellitus with diabetic polyneuropathy: Secondary | ICD-10-CM | POA: Diagnosis not present

## 2023-08-01 DIAGNOSIS — M19072 Primary osteoarthritis, left ankle and foot: Secondary | ICD-10-CM | POA: Diagnosis not present

## 2023-08-08 DIAGNOSIS — S92332D Displaced fracture of third metatarsal bone, left foot, subsequent encounter for fracture with routine healing: Secondary | ICD-10-CM | POA: Diagnosis not present

## 2023-08-08 DIAGNOSIS — E10649 Type 1 diabetes mellitus with hypoglycemia without coma: Secondary | ICD-10-CM | POA: Diagnosis not present

## 2023-08-08 DIAGNOSIS — S92342D Displaced fracture of fourth metatarsal bone, left foot, subsequent encounter for fracture with routine healing: Secondary | ICD-10-CM | POA: Diagnosis not present

## 2023-08-08 DIAGNOSIS — E1042 Type 1 diabetes mellitus with diabetic polyneuropathy: Secondary | ICD-10-CM | POA: Diagnosis not present

## 2023-08-08 DIAGNOSIS — E785 Hyperlipidemia, unspecified: Secondary | ICD-10-CM | POA: Diagnosis not present

## 2023-08-08 DIAGNOSIS — S92322D Displaced fracture of second metatarsal bone, left foot, subsequent encounter for fracture with routine healing: Secondary | ICD-10-CM | POA: Diagnosis not present

## 2023-08-10 ENCOUNTER — Other Ambulatory Visit: Payer: Self-pay

## 2023-08-10 NOTE — Patient Instructions (Signed)
 Visit Information  Thank you for taking time to visit with me today. Please don't hesitate to contact me if I can be of assistance to you before our next scheduled appointment.  Your next care management appointment is by telephone on 08/15/23 at 11 am  Telephone follow-up in 1 month with Michele Ahle, RN case manager.   Please call the care guide team at [phone] if you need to cancel, schedule, or reschedule an appointment.   Please call the Suicide and Crisis Lifeline: 988 call 1-800-273-TALK (toll free, 24 hour hotline) if you are experiencing a Mental Health or Behavioral Health Crisis or need someone to talk to.  Verba Girt RN, BSN, CCM [employer], Population Health Case Manager Phone: [phone]

## 2023-08-10 NOTE — Patient Outreach (Signed)
 Complex Care Management   Visit Note  08/10/2023  Name:  Joyce Brown MRN: [id number] DOB: 10/20/1950  Situation: Referral received for Complex Care Management related to diabetes  I obtained verbal consent from Patient.  Visit completed with patient  on the phone  Background:   Past Medical History:  Diagnosis Date   Arthritis 1995   Have family history of it.   Asthma 1995   Has resolved   Cataract 2017   Family History of them   Cerebellar ataxia (HCC)    Depression 1996   Due to Fibromyalgia.   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   Glaucoma Various   Family History of it.   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    Assessment: Patient Reported Symptoms:  Cognitive Cognitive Status: Alert and oriented to person, place, and time, Insightful and able to interpret abstract concepts, Normal speech and language skills      Neurological Neurological Review of Symptoms: No symptoms reported    HEENT HEENT Symptoms Reported: No symptoms reported      Cardiovascular Cardiovascular Symptoms Reported: No symptoms reported    Respiratory Respiratory Symptoms Reported: No symptoms reported    Endocrine Patient reports the following symptoms related to hypoglycemia or hyperglycemia : No symptoms reported Is patient diabetic?: Yes Is patient checking  blood sugars at home?: Yes Endocrine Conditions: Diabetes Endocrine Management Strategies: Routine screening, Medication therapy, Medical device, Diet modification Endocrine Comment: Patietn reports seeing endocrinologist on 07/25/23.  Hgb A1c down to 8.0% from 9.1%.  Denies having any further issues with hypoglycemia. Patient states she will transition from from the insulin  pump due to POD cost back to insulin  management 09/2023.  Per chart review patients next follow up visit with new endocrinologist, Dr. Marjory Signs is 12/12/23. Patient states she continues to count carbs based on a 1:10 ratio as recommended by her endocrinologist. Patient states her endocrinologist is retiring. She states she has been advised to contact any other endocrinologist in the practice until her new provider appointment in 11/2023.  Gastrointestinal Gastrointestinal Symptoms Reported: No symptoms reported      Genitourinary Genitourinary Symptoms Reported: No symptoms reported    Integumentary Integumentary Symptoms Reported: Other Other Integumentary Symptoms: fungas under fingernail Skin Conditions: Other Other Skin Conditions: nail fungas Skin Management Strategies: Routine screening, Medication therapy Skin Comment: Patient reports having recent follow up visit with dermatologist. She states nail fungas is resolving and nail is healing well.  Patient states she was advised to continue current treatment plan and her next dermatology follow up visit is in 2 months.  Musculoskeletal Musculoskelatal Symptoms Reviewed: Difficulty walking Additional Musculoskeletal Details: status post left foot fracture resolved.  Patient states she has been released from home health services as of this week. She states she continues to do the recommended PT exercises on her own  daily. Patient reports having primary provider visit on 07/29/23 due to left foot pain exacerbated by activity.  She states she continues to elevate foot when sitting, uses  ice and takes OTC tylenol  as needed. She reports pain in foot is improving.   Patient states she is scheduled to get diabetic shoes 08/2023. Patient states she is scheduled to have bone density done 08/2023.        Psychosocial Psychosocial Symptoms Reported: Not assessed            06/30/2023   10:29 AM  Depression screen PHQ 2/9  Decreased Interest 0  Down, Depressed, Hopeless 0  PHQ - 2 Score 0    There were no vitals filed for this visit.  Medications Reviewed Today     Reviewed by Madilynn Montante E, RN (Registered Nurse) on 08/10/23 at 1020  Med List Status: <None>   Medication Order Taking? Sig Documenting Provider Last Dose Status Informant  Acetaminophen  500 MG capsule [id number] No Take 500 mg by mouth every 6 (six) hours as needed for moderate pain. [provider] Taking Active Self  antiseptic oral rinse (BIOTENE) LIQD [id number] No 15 mLs by Mouth Rinse route in the morning, at noon, in the evening, and at bedtime. [provider] Taking Active Self  atorvastatin  (LIPITOR) 80 MG tablet [id number] No TAKE 1 TABLET (80 MG TOTAL) BY MOUTH DAILY. Ostwalt, Janna, PA-C Taking Active   BD INSULIN  SYRINGE U/F 1/2UNIT 31G X 5/16" 0.3 ML MISC [id number] No USE 3 TIMES A DAY Shamleffer, Julian Obey, MD Taking Active Self  carboxymethylcellulose (REFRESH PLUS) 0.5 % SOLN [id number] No Place 1 drop into both eyes 3 (three) times daily as needed. [provider] Taking Active Self  Cholecalciferol (VITAMIN D3) 1000 units CAPS [id number] No Take by mouth. [provider] Taking Active   Clobetasol Propionate (IMPOYZ) 0.025 % CREA [id number] No Apply topically. Patient states applys 1 x per day. [provider] Taking Active Self  Continuous Blood Gluc Sensor (DEXCOM G7 SENSOR) MISC [id number] No by Does not apply route. [provider] Taking Active Self  Cyanocobalamin  (VITAMIN B-12 PO) [id number] No Take by mouth. [provider]  Taking Active   docusate sodium (COLACE) 100 MG capsule [id number] No Take 100 mg by mouth daily as needed for mild constipation. [provider] Taking Active Self  estradiol (ESTRACE) 0.1 MG/GM vaginal cream [id number] No Place 1 Applicatorful vaginally 2 (two) times a week. [provider] Taking Active Self           Med Note Mallie Seal Jan 11, 2023  1:14 PM) Takes 0.5gm  Ferrous Sulfate (IRON PO) [id number] No Take by mouth. [provider] Taking Active   gabapentin  (NEURONTIN ) 100 MG capsule [id number] No Take 2 capsules (200 mg total) by mouth 3 (three) times daily. Simmons-Robinson, Makiera, MD Taking Active Self  Glucagon  (BAQSIMI  TWO PACK) 3 MG/DOSE POWD [id number] No Place 3 mg into the nose as needed (For hypoglycemia). Luna Salinas, MD Taking Active   glucose blood test strip [id number] No Use [provider] Taking Active Self  insulin  aspart (NOVOLOG  PENFILL) cartridge [id number] No USE MAX OF DAILY DOSE OF 60 UNITS PER CORRECTION SCALE AS DIRECTED  Patient taking differently: USE MAX OF DAILY DOSE OF 60 UNITS PER CORRECTION SCALE AS DIRECTED Taking 8 units before breakfast; 10 units before lunch; 7 units before dinner   Shamleffer, Julian Obey, MD Taking Active Self  Insulin  Disposable Pump (  OMNIPOD 5 DEXG7G6 PODS GEN 5) MISC [id number] No SMARTSIG:SUB-Q Every 3 Days [provider] Taking Active Self  Insulin  Pen Needle (DROPLET PEN NEEDLES) 31G X 5 MM MISC [id number] No USE 4 TIMES DAILY AS DIRECTED BY YOUR DOCTOR Shamleffer, Julian Obey, MD Taking Active Self  Microlet Lancets MISC [id number] No 3 (three) times daily. [provider] Taking Active Self  Omega 3 1200 MG CAPS [id number] No Take by mouth. One daily [provider] Taking Active Self  sertraline  (ZOLOFT ) 100 MG tablet [id number] No TAKE 1 TABLET EVERY DAY Simmons-Robinson, Makiera, MD Taking Active Self  Sodium Fluoride  1.1 % PSTE [id number] No  Use 1 Application Nightly after brushing/flossing. Spit out excess. No rinse/eat/drink for at least 30 minutes after application  Taking Active Self  solifenacin  (VESICARE ) 10 MG tablet [id number] No Take 10 mg by mouth daily. [provider] Taking Active Self  Tavaborole 5 % SOLN [id number] No Apply topically. [provider] Taking Active Self  tiZANidine  (ZANAFLEX ) 2 MG tablet [id number] No TAKE 1 TO 2 TABLETS BY MOUTH AT BEDTIME AS NEEDED FOR MUSCLE SPASMS Simmons-Robinson, Makiera, MD Taking Active             Recommendation:   PCP Follow-up  Follow Up Plan:   Telephone follow-up in 1 month. Patient transferring to Michele Ahle, RN case manager for ongoing case management follow up.  Patient agreeable to transfer and ongoing follow up.   Verba Girt RN, BSN, CCM [employer], Population Health Case Manager Phone: [phone]

## 2023-08-11 ENCOUNTER — Ambulatory Visit: Payer: Self-pay | Admitting: Family Medicine

## 2023-08-11 DIAGNOSIS — E1042 Type 1 diabetes mellitus with diabetic polyneuropathy: Secondary | ICD-10-CM | POA: Diagnosis not present

## 2023-08-11 DIAGNOSIS — E785 Hyperlipidemia, unspecified: Secondary | ICD-10-CM | POA: Diagnosis not present

## 2023-08-11 DIAGNOSIS — S92322D Displaced fracture of second metatarsal bone, left foot, subsequent encounter for fracture with routine healing: Secondary | ICD-10-CM | POA: Diagnosis not present

## 2023-08-11 DIAGNOSIS — S92342D Displaced fracture of fourth metatarsal bone, left foot, subsequent encounter for fracture with routine healing: Secondary | ICD-10-CM | POA: Diagnosis not present

## 2023-08-11 DIAGNOSIS — S92332D Displaced fracture of third metatarsal bone, left foot, subsequent encounter for fracture with routine healing: Secondary | ICD-10-CM | POA: Diagnosis not present

## 2023-08-11 DIAGNOSIS — E10649 Type 1 diabetes mellitus with hypoglycemia without coma: Secondary | ICD-10-CM | POA: Diagnosis not present

## 2023-08-11 NOTE — Patient Outreach (Signed)
 Complex Care Management   Visit Note  08/11/2023 Late entry  Name:  Joyce Brown MRN: [id number] DOB: 19-Jun-1950  Situation: Referral received for Complex Care Management related to diabetes and impaired mobility I obtained verbal consent from Patient.  Visit completed with patient  on the phone  Background:   Past Medical History:  Diagnosis Date   Arthritis 1995   Have family history of it.   Asthma 1995   Has resolved   Cataract 2017   Family History of them   Cerebellar ataxia (HCC)    Depression 1996   Due to Fibromyalgia.   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   Glaucoma Various   Family History of it.   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    Assessment: Patient Reported Symptoms:  Cognitive Cognitive Status: Alert and oriented to person, place, and time, Insightful and able to interpret abstract concepts, Normal speech and language skills Cognitive/Intellectual Conditions Management [RPT]: Not Assessed      Neurological Neurological Review of Symptoms: No symptoms reported    HEENT HEENT Symptoms Reported: No symptoms reported      Cardiovascular Cardiovascular Symptoms Reported: No symptoms reported    Respiratory Respiratory Symptoms Reported: No symptoms reported    Endocrine Patient reports the following symptoms related to  hypoglycemia or hyperglycemia : No symptoms reported Is patient diabetic?: Yes Is patient checking blood sugars at home?: Yes Endocrine Conditions: Diabetes Endocrine Management Strategies: Routine screening, Medication therapy, Diet modification Endocrine Self-Management Outcome: 4 (good) Endocrine Comment: Patient states next endocrinology visit is 07/25/23.  States she will be changing to new endocrinologist due to current provider retiring. Patient denies frequent blood sugars <70 or >250. Reports food choices: breakfast- whole wheat toast/ cottage cheese, low sugar jam or fruit; lunch- light chicken salad on 1 piece of whole wheat toast and lettuce; Dinner - progresso ministrone soup, slice of cheese on 15 gr whole wheat bread.  Gastrointestinal Gastrointestinal Symptoms Reported: No symptoms reported      Genitourinary Genitourinary Symptoms Reported: No symptoms reported    Integumentary Integumentary Symptoms Reported: No symptoms reported    Musculoskeletal Musculoskelatal Symptoms Reviewed: No symptoms reported Musculoskeletal Conditions: Other (left foot fracture) Musculoskeletal Management Strategies: Routine screening Musculoskeletal Self-Management Outcome: 4 (good) Musculoskeletal Comment: patient reports no longer having to wear boot to left foot per provider. Able to wear regular sneaker.  Patient reports having tune up/ repair to wheelchair today.      Psychosocial Psychosocial Symptoms Reported: No symptoms reported            06/30/2023   10:29 AM  Depression screen PHQ 2/9  Decreased Interest 0  Down, Depressed, Hopeless 0  PHQ - 2 Score 0    There were no vitals filed for this visit.  Medications Reviewed Today     Reviewed by  Maloni Musleh E, RN (Registered Nurse) on 07/08/23 at 1212  Med List Status: <None>   Medication Order Taking? Sig Documenting Provider Last Dose Status Informant  Acetaminophen  500 MG capsule [id number]  Take 500 mg by mouth every 6  (six) hours as needed for moderate pain. [provider]  Active Self  antiseptic oral rinse (BIOTENE) LIQD [id number]  15 mLs by Mouth Rinse route in the morning, at noon, in the evening, and at bedtime. [provider]  Active Self  atorvastatin  (LIPITOR) 80 MG tablet [id number]  TAKE 1 TABLET (80 MG TOTAL) BY MOUTH DAILY. Ostwalt, Janna, PA-C  Active   BD INSULIN  SYRINGE U/F 1/2UNIT 31G X 5/16" 0.3 ML MISC [id number]  USE 3 TIMES A DAY Shamleffer, Julian Obey, MD  Active Self  carboxymethylcellulose (REFRESH PLUS) 0.5 % SOLN [id number]  Place 1 drop into both eyes 3 (three) times daily as needed. [provider]  Active Self  Cholecalciferol (VITAMIN D3) 1000 units CAPS [id number]  Take by mouth. [provider]  Active   Clobetasol Propionate (IMPOYZ) 0.025 % CREA [id number] Yes Apply topically. Patient states applys 1 x per day. [provider]  Active Self  Continuous Blood Gluc Sensor (DEXCOM G7 SENSOR) MISC [id number]  by Does not apply route. [provider]  Active Self  Cyanocobalamin  (VITAMIN B-12 PO) [id number]  Take by mouth. [provider]  Active   docusate sodium (COLACE) 100 MG capsule [id number]  Take 100 mg by mouth daily as needed for mild constipation. [provider]  Active Self  estradiol (ESTRACE) 0.1 MG/GM vaginal cream [id number]  Place 1 Applicatorful vaginally 2 (two) times a week. [provider]  Active Self           Med Note Mallie Seal Jan 11, 2023  1:14 PM) Takes 0.5gm  Ferrous Sulfate (IRON PO) [id number]  Take by mouth. [provider]  Active   gabapentin  (NEURONTIN ) 100 MG capsule [id number]  Take 2 capsules (200 mg total) by mouth 3 (three) times daily. Simmons-Robinson, Makiera, MD  Active Self  Glucagon  (BAQSIMI  TWO PACK) 3 MG/DOSE POWD [id number]  Place 3 mg into the nose as needed (For hypoglycemia). Luna Salinas, MD  Active   glucose blood test strip [id number]   Use [provider]  Active Self  insulin  aspart (NOVOLOG  PENFILL) cartridge [id number]  USE MAX OF DAILY DOSE OF 60 UNITS PER CORRECTION SCALE AS DIRECTED  Patient taking differently: USE MAX OF DAILY DOSE OF 60 UNITS PER CORRECTION SCALE AS DIRECTED Taking 8 units before breakfast; 10 units before lunch; 7 units before dinner   Shamleffer, Julian Obey, MD  Active Self  Insulin  Disposable Pump (OMNIPOD 5 DEXG7G6 PODS GEN 5) MISC [id number]  SMARTSIG:SUB-Q Every 3 Days [provider]  Active Self  Insulin  Pen Needle (DROPLET PEN NEEDLES) 31G X 5 MM MISC [id number]  USE 4 TIMES DAILY AS DIRECTED BY YOUR DOCTOR Shamleffer, Julian Obey, MD  Active Self  Microlet Lancets MISC [id number]  3 (three) times daily. [provider]  Active Self  Omega 3 1200 MG CAPS [id number]  Take by mouth. One daily [provider]  Active Self  sertraline  (ZOLOFT ) 100 MG tablet [id number]  TAKE 1 TABLET EVERY DAY Simmons-Robinson, Makiera, MD  Active Self  Sodium Fluoride  1.1 % PSTE [id number]  Use 1 Application Nightly after brushing/flossing. Spit out excess. No rinse/eat/drink for at least 30 minutes after application   Active Self  solifenacin  (  VESICARE ) 10 MG tablet [id number]  Take 10 mg by mouth daily. [provider]  Active Self  Tavaborole 5 % SOLN [id number]  Apply topically. [provider]  Active Self  tiZANidine  (ZANAFLEX ) 2 MG tablet [id number]  TAKE 1 TO 2 TABLETS BY MOUTH AT BEDTIME AS NEEDED FOR MUSCLE SPASMS Simmons-Robinson, Makiera, MD  Active             Recommendation:   PCP Follow-up  Follow Up Plan:   Telephone follow-up in 1 month  Verba Girt RN, BSN, CCM [employer], Population Health Case Manager Phone: [phone]

## 2023-08-15 ENCOUNTER — Other Ambulatory Visit: Payer: Self-pay | Admitting: *Deleted

## 2023-08-15 NOTE — Patient Outreach (Addendum)
 Complex Care Management   Visit Note  08/15/2023  Name:  Thana Ramp MRN: [id number] DOB: [birth year]  Situation: Referral received for Complex Care Management related to SDOH Barriers:  Housing patient requesting assistance with identifying an assisted living placement. Patient has toured [facility] and has paid the community fee for a guaranteed place on the waiting list. I obtained verbal consent from Patient.  Visit completed with patient   on the phone  Background:   Past Medical History:  Diagnosis Date   Arthritis 1995   Have family history of it.   Asthma 1995   Has resolved   Cataract 2017   Family History of them   Cerebellar ataxia (HCC)    Depression 1996   Due to Fibromyalgia.   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   Glaucoma Various   Family History of it.   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    Assessment: Patient Reported Symptoms:  Cognitive Cognitive Status: Alert and oriented to person, place, and time, Insightful and able to interpret abstract concepts, Normal speech and language skills Cognitive/Intellectual Conditions Management [RPT]: None reported or documented in medical history or problem list      Neurological Neurological Review of Symptoms: No symptoms reported    HEENT HEENT Symptoms Reported: No symptoms  reported      Cardiovascular Cardiovascular Symptoms Reported: No symptoms reported    Respiratory Respiratory Symptoms Reported: No symptoms reported    Endocrine Patient reports the following symptoms related to hypoglycemia or hyperglycemia : No symptoms reported Is patient diabetic?: Yes Is patient checking blood sugars at home?: Yes Endocrine Conditions: Diabetes Endocrine Management Strategies: Medical device, Medication therapy, Routine screening Endocrine Comment: will be followed by new endocrinologist in September- 12/11/23 Dr. Neale Bale followed on 07/25/23  Gastrointestinal Gastrointestinal Symptoms Reported: No symptoms reported      Genitourinary Genitourinary Symptoms Reported: No symptoms reported    Integumentary Integumentary Symptoms Reported: Other Other Integumentary Symptoms: fungas under fingernail Other Skin Conditions: nail fungus Skin Management Strategies: Routine screening, Medication therapy Skin Comment: Seen by dernmatologist on 07/26/23-nail fungus resolving slowly-nails trimmed at that time as well  Musculoskeletal Musculoskelatal Symptoms Reviewed: No symptoms reported Additional Musculoskeletal Details: discharged from outpatient PT on 08/11/23-foot feeling much better-continues with excercises learned from outpatient PT-scheduled for diabetic shoes on 09/02/23 as well as bone density and mammogram Musculoskeletal Conditions: Rheumatoid arthritis Other Musculoskeletal Conditions: x-ray 4/22 showed arthritis in left foot, also has hammer toe on left foot(little toe) Musculoskeletal Management Strategies: Routine screening, Exercise Musculoskeletal Self-Management Outcome: 4 (good) Falls in the past year?: Yes Number of falls in past year: 2 or more Was there an injury with Fall?: Yes Fall Risk Category Calculator: 3 Patient Fall Risk Level: High Fall Risk Patient at Risk for Falls Due to: History of fall(s) Fall risk Follow up:  Falls prevention  discussed  Psychosocial Psychosocial Symptoms Reported: No symptoms reported            06/30/2023   10:29 AM  Depression screen PHQ 2/9  Decreased Interest 0  Down, Depressed, Hopeless 0  PHQ - 2 Score 0    There were no vitals filed for this visit.  Medications Reviewed Today     Reviewed by Ave Leisure, LCSW (Social Worker) on 08/15/23 at 1013  Med List Status: <None>   Medication Order Taking? Sig Documenting Provider Last Dose Status Informant  Acetaminophen  500 MG capsule [id number] Yes Take 500 mg by mouth every 6 (six) hours as needed for moderate pain. [provider] Taking Active Self  antiseptic oral rinse (BIOTENE) LIQD [id number] Yes 15 mLs by Mouth Rinse route in the morning, at noon, in the evening, and at bedtime. [provider] Taking Active Self  atorvastatin  (LIPITOR) 80 MG tablet [id number] Yes TAKE 1 TABLET (80 MG TOTAL) BY MOUTH DAILY. Ostwalt, Janna, PA-C Taking Active   BD INSULIN  SYRINGE U/F 1/2UNIT 31G X 5/16" 0.3 ML MISC [id number] Yes USE 3 TIMES A DAY Shamleffer, Julian Obey, MD Taking Active Self  carboxymethylcellulose (REFRESH PLUS) 0.5 % SOLN [id number] Yes Place 1 drop into both eyes 3 (three) times daily as needed. [provider] Taking Active Self  Cholecalciferol (VITAMIN D3) 1000 units CAPS [id number] Yes Take by mouth. [provider] Taking Active   Clobetasol Propionate (IMPOYZ) 0.025 % CREA [id number] Yes Apply topically. Patient states applys 1 x per day. [provider] Taking Active Self  Continuous Blood Gluc Sensor (DEXCOM G7 SENSOR) MISC [id number] Yes by Does not apply route. [provider] Taking Active Self  Cyanocobalamin  (VITAMIN B-12 PO) [id number] Yes Take by mouth. [provider] Taking Active   docusate sodium (COLACE) 100 MG capsule [id number] Yes Take 100 mg by mouth daily as needed for mild constipation. [provider] Taking Active Self  estradiol  (ESTRACE) 0.1 MG/GM vaginal cream [id number] Yes Place 1 Applicatorful vaginally 2 (two) times a week. [provider] Taking Active Self           Med Note Mallie Seal Jan 11, 2023  1:14 PM) Takes 0.5gm  Ferrous Sulfate (IRON PO) [id number] Yes Take by mouth. [provider] Taking Active   gabapentin  (NEURONTIN ) 100 MG capsule [id number] Yes Take 2 capsules (200 mg total) by mouth 3 (three) times daily. Simmons-Robinson, Makiera, MD Taking Active Self  Glucagon  (BAQSIMI  TWO PACK) 3 MG/DOSE POWD [id number] Yes Place 3 mg into the nose as needed (For hypoglycemia). Luna Salinas, MD Taking Active   glucose blood test strip [id number] Yes Use [provider] Taking Active Self  insulin  aspart (NOVOLOG  PENFILL) cartridge [id number] Yes USE MAX OF DAILY DOSE OF 60 UNITS PER CORRECTION SCALE AS DIRECTED  Patient taking differently: USE MAX OF DAILY DOSE OF 60 UNITS PER CORRECTION SCALE AS DIRECTED Taking 8 units before breakfast; 10 units before lunch; 7 units before dinner   Shamleffer, Julian Obey, MD Taking Active Self  Insulin  Disposable Pump (OMNIPOD 5 DEXG7G6 PODS GEN 5) MISC [id number] Yes SMARTSIG:SUB-Q Every 3 Days [provider] Taking Active Self  Insulin  Pen Needle (DROPLET PEN NEEDLES) 31G X 5 MM MISC [id number] Yes USE 4 TIMES DAILY AS DIRECTED BY YOUR DOCTOR Shamleffer, Julian Obey, MD Taking Active Self  Microlet Lancets MISC [id number] Yes 3 (three) times daily. [provider] Taking Active Self  Omega  3 1200 MG CAPS [id number] Yes Take by mouth. One daily [provider] Taking Active Self  sertraline  (ZOLOFT ) 100 MG tablet [id number] Yes TAKE 1 TABLET EVERY DAY Simmons-Robinson, Makiera, MD Taking Active Self  Sodium Fluoride  1.1 % PSTE [id number] Yes Use 1 Application Nightly after brushing/flossing. Spit out excess. No rinse/eat/drink for at least 30 minutes after application  Taking Active Self  solifenacin  (VESICARE )  10 MG tablet [id number] Yes Take 10 mg by mouth daily. [provider] Taking Active Self  Tavaborole 5 % SOLN [id number] Yes Apply topically. [provider] Taking Active Self  tiZANidine  (ZANAFLEX ) 2 MG tablet [id number] Yes TAKE 1 TO 2 TABLETS BY MOUTH AT BEDTIME AS NEEDED FOR MUSCLE SPASMS Simmons-Robinson, Makiera, MD Taking Active             Recommendation:   PCP Follow-up  Follow Up Plan:   Telephone follow-up 09/09/23  Michaelle Adolphus, LCSW [place]  [facility], [facility] Licensed Clinical Social Worker Care Coordinator  Direct Dial: [phone]

## 2023-08-15 NOTE — Patient Instructions (Signed)
 Visit Information  Thank you for taking time to visit with me today. Please don't hesitate to contact me if I can be of assistance to you before our next scheduled appointment.  Your next care management appointment is by telephone on 09/09/23 at 10am  Telephone follow-up in 1 month  Please call the care guide team at [phone] if you need to cancel, schedule, or reschedule an appointment.   Please call 911 if you are experiencing a Mental Health or Behavioral Health Crisis or need someone to talk to.   Kenzie Thoreson, LCSW [place]  [facility], [facility] Licensed Clinical Social Worker Care Coordinator  Direct Dial: [phone]

## 2023-08-18 ENCOUNTER — Other Ambulatory Visit

## 2023-08-29 ENCOUNTER — Ambulatory Visit (INDEPENDENT_AMBULATORY_CARE_PROVIDER_SITE_OTHER): Admitting: Podiatry

## 2023-08-29 ENCOUNTER — Encounter: Payer: Self-pay | Admitting: Podiatry

## 2023-08-29 VITALS — Ht 70.0 in | Wt 148.9 lb

## 2023-08-29 DIAGNOSIS — M79674 Pain in right toe(s): Secondary | ICD-10-CM

## 2023-08-29 DIAGNOSIS — B351 Tinea unguium: Secondary | ICD-10-CM

## 2023-08-29 DIAGNOSIS — E1042 Type 1 diabetes mellitus with diabetic polyneuropathy: Secondary | ICD-10-CM

## 2023-08-29 DIAGNOSIS — M79675 Pain in left toe(s): Secondary | ICD-10-CM

## 2023-09-02 ENCOUNTER — Encounter: Payer: Self-pay | Admitting: Podiatry

## 2023-09-02 NOTE — Progress Notes (Signed)
  Subjective:  Patient ID: Joyce Brown, female    DOB: Jan 22, 1951,  MRN: [id number]  Joyce Brown presents to clinic today for at risk foot care with history of diabetic neuropathy and painful mycotic toenails of both feet that are difficult to trim. Pain interferes with daily activities and wearing enclosed shoe gear comfortably.  She has had a fall since our last visit and broke her left foot. It has healed. Patient states she will be moving to [facility]. Chief Complaint  Patient presents with   Nail Problem    Pt is here for [facility] last A1C was 8 PCP is Dr Saralee Cummins and LOV was in April.   New problem(s): None.   PCP is Simmons-Robinson, Makiera, MD.  No Known Allergies  Review of Systems: Negative except as noted in the HPI.  Objective: No changes noted in today's physical examination. There were no vitals filed for this visit. Joyce Brown is a pleasant 73 y.o. female in NAD. AAO x 3.  Vascular Examination: Capillary refill time immediate b/l. Vascular status intact b/l with palpable pedal pulses. Pedal hair present b/l. No pain with calf compression b/l. Skin temperature gradient WNL b/l. No cyanosis or clubbing b/l. No ischemia or gangrene noted b/l.   Neurological Examination: Sensation grossly intact b/l with 10 gram monofilament. Vibratory sensation intact b/l. Pt has subjective symptoms of neuropathy.  Dermatological Examination: Pedal skin with normal turgor, texture and tone b/l.  No open wounds. No interdigital macerations.   Toenails 1-5 b/l thick, discolored, elongated with subungual debris and pain on dorsal palpation.   No corns, calluses nor porokeratotic lesions noted.  Musculoskeletal Examination: Muscle strength 5/5 to all lower extremity muscle groups bilaterally. No pain, crepitus or joint limitation noted with ROM bilateral LE. Hammertoe deformity noted 2-5 b/l. Utilizes wheelchair for mobility assistance.  Assessment/Plan: 1. Pain  due to onychomycosis of toenails of both feet   2. Diabetic polyneuropathy associated with type 1 diabetes mellitus (HCC)     Patient was evaluated and treated. All patient's and/or POA's questions/concerns addressed on today's visit. Mycotic toenails 1-5 debrided in length and girth without incident. Continue soft, supportive shoe gear daily. Report any pedal injuries to medical professional. Call office if there are any quesitons/concerns. -Patient/POA to call should there be question/concern in the interim.   Return in about 3 months (around 11/29/2023).  Joyce Brown, DPM      [facility] LOCATION: [address] [place] [address]                   Office [phone]   [facility] LOCATION: [address], [place] [address] Office [phone]

## 2023-09-08 ENCOUNTER — Ambulatory Visit
Admission: RE | Admit: 2023-09-08 | Discharge: 2023-09-08 | Disposition: A | Discharge status: Home / Self Care | Source: Ambulatory Visit | Attending: Family Medicine | Admitting: Family Medicine

## 2023-09-08 DIAGNOSIS — Z780 Asymptomatic menopausal state: Secondary | ICD-10-CM | POA: Insufficient documentation

## 2023-09-08 DIAGNOSIS — Z1231 Encounter for screening mammogram for malignant neoplasm of breast: Secondary | ICD-10-CM

## 2023-09-08 DIAGNOSIS — M810 Age-related osteoporosis without current pathological fracture: Secondary | ICD-10-CM | POA: Diagnosis not present

## 2023-09-09 ENCOUNTER — Ambulatory Visit: Payer: Medicare Other | Admitting: Family Medicine

## 2023-09-09 ENCOUNTER — Other Ambulatory Visit: Payer: Self-pay | Admitting: *Deleted

## 2023-09-09 ENCOUNTER — Ambulatory Visit

## 2023-09-09 NOTE — Patient Instructions (Signed)
 Visit Information  Thank you for taking time to visit with me today.   Patient has met all social work care management goals. Care Management-social work case will be closed. Patient has been provided contact information should new needs arise.   Please call the care guide team at [phone] if you need to cancel, schedule, or reschedule an appointment.   Please call 911 if you are experiencing a Mental Health or Behavioral Health Crisis or need someone to talk to.  Beauden Tremont, LCSW [place]  [facility], [facility] Licensed Clinical Social Worker  Direct Dial: [phone]

## 2023-09-09 NOTE — Patient Outreach (Signed)
 Complex Care Management   Visit Note  09/09/2023  Name:  Joyce Brown MRN: [id number] DOB: 06-08-1950  Situation: Referral received for Complex Care Management related to placement in an Assisted Living-[facility] I obtained verbal consent from Patient.  Visit completed with patient   on the phone  Background:   Past Medical History:  Diagnosis Date   Arthritis 1995   Have family history of it.   Asthma 1995   Has resolved   Cataract 2017   Family History of them   Cerebellar ataxia (HCC)    Depression 1996   Due to Fibromyalgia.   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   Glaucoma Various   Family History of it.   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 Cyn[facility] in August or September  HEENT HEENT  Symptoms Reported: No symptoms reported      Cardiovascular Cardiovascular Symptoms Reported: No symptoms reported    Respiratory Respiratory Symptoms Reported: No symptoms reported    Endocrine Patient reports the following symptoms related to hypoglycemia or hyperglycemia : No symptoms reported Is patient diabetic?: Yes Is patient checking blood sugars at home?: Yes Endocrine Conditions: Diabetes Endocrine Management Strategies: Medical device, Medication therapy, Exercise Endocrine Self-Management Outcome: 4 (good) Endocrine Comment: will follow up with new endocronologst 12/11/23  Gastrointestinal Gastrointestinal Symptoms Reported: No symptoms reported      Genitourinary Genitourinary Symptoms Reported: No symptoms reported    Integumentary Integumentary Symptoms Reported: Other Other Integumentary Symptoms: fungus  under fingernail treating with tavaborole    Musculoskeletal Additional Musculoskeletal Details: D/C  from [facility] PT, will get diabetic shoes today, 09/08/23 -bone density-showed some osteporosis, has not received results  of mammogram Musculoskeletal Conditions: Osteoporosis, Rheumatoid arthritis, Fracture Musculoskeletal Management Strategies: Routine screening, Exercise Musculoskeletal Comment: bump on top of foot has improved, no pain      Psychosocial Psychosocial Symptoms Reported: No symptoms reported            06/30/2023   10:29 AM  Depression screen PHQ 2/9  Decreased Interest 0  Down, Depressed, Hopeless 0  PHQ - 2 Score 0      06/13/2023   11:09 AM 05/12/2023    8:49 AM  GAD 7 : Generalized Anxiety Score  Nervous, Anxious, on Edge 0 0  Control/stop worrying 0 0  Worry too much - different things 0 0  Trouble relaxing 0 0  Restless  0 0  Easily annoyed or irritable 0 0  Afraid - awful might happen 0 0  Total GAD 7 Score 0 0  Anxiety Difficulty Not difficult at all Not difficult at all     There were no vitals filed for this  visit.  Medications Reviewed Today   Medications were not reviewed in this encounter     Recommendation:   PCP Follow-up Specialty provider follow-up as scheduled  Follow Up Plan:   Patient has met all care management goals. Care Management case will be closed. Patient has been provided contact information should new needs arise.     Dwain Huhn, LCSW [place]  [facility], [facility] Licensed Clinical Social Worker  Direct Dial: [phone]

## 2023-09-12 ENCOUNTER — Other Ambulatory Visit: Payer: Self-pay | Admitting: *Deleted

## 2023-09-12 ENCOUNTER — Other Ambulatory Visit: Payer: Self-pay

## 2023-09-12 ENCOUNTER — Ambulatory Visit: Payer: Self-pay | Admitting: Family Medicine

## 2023-09-12 ENCOUNTER — Encounter: Payer: Self-pay | Admitting: *Deleted

## 2023-09-12 NOTE — Progress Notes (Signed)
     Patient presents today to pick up diabetic shoes and insoles.  Patient was dispensed 1 pair of diabetic shoes and 3 pairs of foam casted diabetic insoles. Fit was satisfactory. Instructions for break-in and wear was reviewed and a copy was given to the patient.   Re-appointment for regularly scheduled diabetic foot care visits or if they should experience any trouble with the shoes or insoles.   Ppw has expired re-faxing to Dr for new dates once recvd patient can just pick up or we can ship since she has already been fit  Lolita Schultze Cped, CFo, CFm

## 2023-09-12 NOTE — Patient Outreach (Unsigned)
 Complex Care Management   Visit Note  09/12/2023  Name:  Joyce Brown MRN: [id number] DOB: August 16, 1950  Situation: Referral received for Complex Care Management related to diabetes and impaired mobility/balance. I obtained verbal consent from Patient.  Visit completed with Joyce Brown on the phone  Background:   Past Medical History:  Diagnosis Date   Arthritis 1995   Have family history of it.   Asthma 1995   Has resolved   Cataract 2017   Family History of them   Cerebellar ataxia (HCC)    Depression 1996   Due to Fibromyalgia.   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   Glaucoma Various   Family History of it.   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  Medications Reviewed Today     Reviewed by Charlsie Josette SAILOR, RN (Registered Nurse) on 09/12/23 at 1023  Med List Status: <None>   Medication Order  Taking? Sig Documenting Provider Last Dose Status Informant  Acetaminophen  500 MG capsule [id number] Yes Take 500 mg by mouth every 6 (six) hours as needed for moderate pain. [provider]  Active Self  antiseptic oral rinse (BIOTENE) LIQD [id number] Yes 15 mLs by Mouth Rinse route in the morning, at noon, in the evening, and at bedtime. [provider]  Active Self  atorvastatin  (LIPITOR) 80 MG tablet [id number] Yes TAKE 1 TABLET (80 MG TOTAL) BY MOUTH DAILY. Ostwalt, Janna, PA-C  Active   BD INSULIN  SYRINGE U/F 1/2UNIT 31G X 5/16 0.3 ML MISC [id number] Yes USE 3 TIMES A DAY Shamleffer, Donell Cardinal, MD  Active Self  carboxymethylcellulose (REFRESH PLUS) 0.5 % SOLN [id number] Yes Place 1 drop into both eyes 3 (three) times daily as needed. [provider]  Active Self  Cholecalciferol (VITAMIN D3) 1000 units CAPS [id number] Yes Take by mouth. [provider]  Active   Clobetasol Propionate (IMPOYZ) 0.025 % CREA [id number] Yes Apply topically. Patient states applys 1 x per day. [provider]  Active Self  Continuous Blood Gluc Sensor (DEXCOM G7 SENSOR) MISC [id number] Yes by Does not apply route.  [provider]  Active Self  Cyanocobalamin  (VITAMIN B-12 PO) [id number] Yes Take by mouth. [provider]  Active   docusate sodium (COLACE) 100 MG capsule [id number] Yes Take 100 mg by mouth daily as needed for mild constipation. [provider]  Active Self  estradiol (ESTRACE) 0.1 MG/GM vaginal cream [id number] Yes Place 1 Applicatorful vaginally 2 (two) times a week. [provider]  Active Self           Med Note HUMBERTO JHONNIE GORMAN Sonjia Jan 11, 2023  1:14 PM) Takes 0.5gm  Ferrous Sulfate (IRON PO) [id number] Yes Take by mouth. [provider]  Active   gabapentin  (NEURONTIN ) 100 MG capsule [id number] Yes Take 2 capsules (200 mg total) by mouth 3 (three) times daily. Simmons-Robinson, Makiera, MD  Active Self   Glucagon  (BAQSIMI  TWO PACK) 3 MG/DOSE POWD [id number] Yes Place 3 mg into the nose as needed (For hypoglycemia). Caleen Qualia, MD  Active   glucose blood test strip [id number] Yes Use [provider]  Active Self  insulin  aspart (NOVOLOG  PENFILL) cartridge [id number] Yes USE MAX OF DAILY DOSE OF 60 UNITS PER CORRECTION SCALE AS DIRECTED  Patient taking differently: USE MAX OF DAILY DOSE OF 60 UNITS PER CORRECTION SCALE AS DIRECTED Taking 8 units before breakfast; 10 units before lunch; 7 units before dinner   Shamleffer, Donell Cardinal, MD  Active Self  Insulin  Disposable Pump (OMNIPOD 5 DEXG7G6 PODS GEN 5) MISC [id number] Yes SMARTSIG:SUB-Q Every 3 Days [provider]  Active Self  Insulin  Pen Needle (DROPLET PEN NEEDLES) 31G X 5 MM MISC [id number] Yes USE 4 TIMES DAILY AS DIRECTED BY YOUR DOCTOR Shamleffer, Donell Cardinal, MD  Active Self  Microlet Lancets MISC [id number] Yes 3 (three) times daily. [provider]  Active Self  Omega 3 1200 MG CAPS [id number] Yes Take by mouth. One daily [provider]  Active Self  sertraline  (ZOLOFT ) 100 MG tablet [id number] Yes TAKE 1 TABLET EVERY DAY Simmons-Robinson, Makiera, MD  Active Self  Sodium Fluoride  1.1 % PSTE [id number] Yes Use 1 Application Nightly after brushing/flossing. Spit out excess. No rinse/eat/drink for at least 30 minutes after application   Active Self  solifenacin  (VESICARE ) 10 MG tablet [id number] Yes Take 10 mg by mouth daily. [provider]  Active Self  Tavaborole 5 % SOLN [id number] Yes Apply topically. [provider]  Active Self  tiZANidine  (ZANAFLEX ) 2 MG tablet [id number] Yes TAKE 1 TO 2 TABLETS BY MOUTH AT BEDTIME AS NEEDED FOR MUSCLE SPASMS Simmons-Robinson, Makiera, MD  Active             Recommendation:   Continue Current Plan of Care  Follow Up Plan:   Telephone follow up appointment date/time:  10/14/23 at 10:00  Josette Pellet, RN, BSN [place]  VBCI  Population Health RN Care Manager Direct Dial: [phone]  Fax: [phone]

## 2023-09-21 ENCOUNTER — Encounter: Payer: Self-pay | Admitting: Family Medicine

## 2023-09-21 ENCOUNTER — Ambulatory Visit (INDEPENDENT_AMBULATORY_CARE_PROVIDER_SITE_OTHER): Admitting: Family Medicine

## 2023-09-21 VITALS — BP 115/62 | HR 60 | Ht 70.0 in | Wt 174.0 lb

## 2023-09-21 DIAGNOSIS — M810 Age-related osteoporosis without current pathological fracture: Secondary | ICD-10-CM

## 2023-09-21 DIAGNOSIS — Z111 Encounter for screening for respiratory tuberculosis: Secondary | ICD-10-CM

## 2023-09-21 MED ORDER — ALENDRONATE SODIUM 70 MG PO TABS: 70.0000 mg | ORAL_TABLET | ORAL | 11 refills | Status: DC

## 2023-09-21 NOTE — Progress Notes (Signed)
 Established patient visit   Patient: Joyce Brown   DOB: 1950-05-15   73 y.o. Female  MRN: [id number] Visit Date: 09/21/2023  Today's healthcare provider: Rockie Agent, MD   Chief Complaint  Patient presents with   Blood work    Patient is present to request a TB test. She reports she is moving into a living facility and they are requesting the results before she moves. Unaware if they prefer skin test or lab work    Documentation    Patient reports she will leave FL2 form to be completed for todays visit. She is also been advised by [facility] she is needing Gold DNR form. As well as she is needing MOST form signed   Subjective     HPI     Blood work    Additional comments: Patient is present to request a TB test. She reports she is moving into a living facility and they are requesting the results before she moves. Unaware if they prefer skin test or lab work         Documentation    Additional comments: Patient reports she will leave FL2 form to be completed for todays visit. She is also been advised by [facility] she is needing Gold DNR form. As well as she is needing MOST form signed      Last edited by Lilian Fitzpatrick, CMA on 09/21/2023  1:32 PM.       Discussed the use of AI scribe software for clinical note transcription with the patient, who gave verbal consent to proceed.  History of Present Illness Joyce Brown is a 73 year old female who presents for tuberculosis screening and osteoporosis management.  She is moving into a living facility and requires a Quantiferon Gold test for tuberculosis screening. Additionally, she needs an FL2 form, a MOST form, and a DNR form completed for her transition into the facility.  She has a history of osteoporosis with a significant drop in bone density since 2022. Her previous bone density test in 2022 was normal, but her most recent test showed osteoporosis in the lumbar spine and forearm.  She is not currently on any osteoporosis medication but takes a bariatric multivitamin containing D3, iron, and B12, and uses Calciven for natural bone support. Her endocrinologist retired, and she is awaiting a new endocrinologist, Dr. Carlin, who will start in September. She engages in resistance exercises using resistance bands twice a week.  She experienced low blood sugar in January and a foot fracture in May, which have contributed to her decision to move into a living facility for additional support. Her bladder symptoms have improved with better blood sugar control, noting a decrease in A1c from 9 to 8. She experiences dizziness upon standing, associated with changes in blood pressure.  She has a history of working with horses, which she believes contributed to her previously good bone density. She is proactive in maintaining her health, ensuring a clear path in her living space to prevent falls and consuming adequate water daily.     Past Medical History:  Diagnosis Date   Arthritis 1995   Have family history of it.   Asthma 1995   Has resolved   Cataract 2017   Family History of them   Cerebellar ataxia (HCC)    Depression 1996   Due to Fibromyalgia.   Diabetes mellitus without complication (HCC)    Diabetic neuropathy (HCC) 12/09/2020   FH: ovarian cancer in  first degree relative 11/16/2018   Foot fracture 2022   Gait abnormality 11/07/2018   GERD (gastroesophageal reflux disease) 1985   No longer a problem since Gastric Bypass Surgery 09/25/2012.   Glaucoma Various   Family History of it.   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   antiseptic oral rinse (BIOTENE) LIQD 15 mLs by Mouth Rinse route in the morning, at noon, in the evening, and at bedtime.   atorvastatin  (LIPITOR) 80 MG tablet TAKE 1 TABLET (80 MG TOTAL) BY MOUTH DAILY.   BD INSULIN  SYRINGE U/F 1/2UNIT 31G X 5/16 0.3 ML MISC USE 3 TIMES A DAY   carboxymethylcellulose (REFRESH PLUS) 0.5 % SOLN Place 1 drop into both eyes 3 (three) times daily as needed.   Cholecalciferol (VITAMIN D3) 1000 units CAPS Take by mouth.   Clobetasol Propionate (IMPOYZ) 0.025 % CREA Apply topically. Patient states applys 1 x per day.   Continuous Blood Gluc Sensor (DEXCOM G7 SENSOR) MISC by Does not apply route.   Cyanocobalamin  (VITAMIN B-12 PO) Take by mouth.   estradiol (ESTRACE) 0.1 MG/GM vaginal cream Place 1 Applicatorful vaginally 2 (two) times a week.   Ferrous Sulfate (IRON PO) Take by mouth.   gabapentin  (NEURONTIN ) 100 MG capsule Take 2 capsules (200 mg total) by mouth 3 (three) times daily.   Glucagon  (BAQSIMI  TWO PACK) 3 MG/DOSE POWD Place 3 mg into the nose as needed (For hypoglycemia).   glucose blood test strip Use   insulin  aspart (NOVOLOG  PENFILL) cartridge USE MAX OF DAILY DOSE OF 60 UNITS PER CORRECTION SCALE AS DIRECTED (Patient taking differently: USE MAX OF DAILY DOSE OF 60 UNITS PER CORRECTION SCALE AS DIRECTED Taking 8 units before breakfast; 10 units before lunch; 7 units before dinner)   Insulin  Disposable Pump (OMNIPOD 5 DEXG7G6 PODS GEN 5) MISC SMARTSIG:SUB-Q Every 3 Days   Insulin  Pen Needle (DROPLET PEN NEEDLES) 31G X 5 MM MISC USE 4 TIMES DAILY AS DIRECTED BY YOUR DOCTOR   Microlet Lancets MISC 3 (three) times daily.   sertraline  (ZOLOFT ) 100 MG tablet TAKE 1 TABLET EVERY DAY   Sodium Fluoride  1.1 % PSTE Use 1 Application Nightly after brushing/flossing. Spit  out excess. No rinse/eat/drink for at least 30 minutes after application   solifenacin  (VESICARE ) 10 MG tablet Take 10 mg by mouth daily.   Tavaborole 5 % SOLN Apply topically.   tiZANidine  (ZANAFLEX ) 2 MG tablet TAKE 1 TO 2 TABLETS BY MOUTH AT BEDTIME AS NEEDED FOR MUSCLE SPASMS   docusate sodium (COLACE) 100 MG capsule Take 100 mg by mouth daily as needed for mild constipation. (Patient not taking: Reported on 09/21/2023)   Omega 3 1200 MG CAPS Take by mouth. One daily (Patient not taking: Reported on 09/21/2023)   No facility-administered medications prior to visit.    Review of Systems      Objective    BP 115/62 (BP Location: Left  Arm, Patient Position: Sitting, Cuff Size: Normal)   Pulse 60   Ht 5' 10 (1.778 m)   Wt 174 lb (78.9 kg)   BMI 24.97 kg/m      Physical Exam  General: Alert, no acute distress, seated in transport chair  Cardio: RRR, no r/m/g Pulm: CTAB, normal work of breathing    Results for orders placed or performed in visit on 09/21/23  QuantiFERON-TB Gold Plus  Result Value Ref Range   QuantiFERON Incubation WILL FOLLOW    QuantiFERON Criteria WILL FOLLOW    QuantiFERON TB1 Ag Value WILL FOLLOW    QuantiFERON TB2 Ag Value WILL FOLLOW    QuantiFERON Nil Value WILL FOLLOW    QuantiFERON Mitogen Value WILL FOLLOW    QuantiFERON-TB Gold Plus WILL FOLLOW     Assessment & Plan     Problem List Items Addressed This Visit       Musculoskeletal and Integument   Age-related osteoporosis without current pathological fracture - Primary    Osteoporosis Diagnosed with osteoporosis, confirmed by a recent bone density test showing decreased bone density compared to 2022. She is not on osteoporosis medication. Addressing bone density loss is crucial to reduce fracture risk. She is advised to continue a bariatric multivitamin, calcium  supplement, and engage in weight-bearing exercises. Fosamax  is recommended to slow bone density loss and reduce fracture risk.  Potential side effects include achy bones or joints, and she should report any intolerable side effects. If Fosamax  is not tolerated, Prolia injections every six months may be considered. - Start Fosamax  70 mg once weekly - Continue bariatric multivitamin and calcium  supplement - Engage in weight-bearing exercises twice a week - Monitor for side effects of Fosamax  and report any intolerable symptoms      Relevant Medications   alendronate  (FOSAMAX ) 70 MG tablet   Other Visit Diagnoses       Screening-pulmonary TB       Relevant Orders   QuantiFERON-TB Gold Plus (Completed)       Assessment and Plan Assessment & Plan Tuberculosis Screening She requires a Quantiferon Gold test for tuberculosis screening due to her move into a living facility. Results are expected in one week to ten days. If negative, no further testing is needed. If the facility requires a skin test, she will visit the health department. - Order Maya Cera test for tuberculosis screening   Diabetes Mellitus Diabetes mellitus with recent improvement in A1c from 9 to 8. - continue follow up with endocrinology   Goals of Care She requires completion of an FL2 form, a MOST form, and a DNR form due to her move into a living facility. She prefers no antibiotics, probiotics, or feeding tube, and these preferences are documented in the forms. She is comfortable with the transition and agrees to the care plan outlined in the forms. - Completed FL2 form(will be completed within 5 business days, forms signed by MD on 09/23/23), MOST form, and DNR form - Document preferences for no antibiotics, probiotics, or feeding tube  Follow-up She will have an appointment with a new endocrinologist, Dr. Carlin, in September and should follow up with family practice in October. She should report any issues with Fosamax  or other health concerns. - Schedule follow-up appointment with family practice in October - Follow up with  endocrinologist in September - Report any issues with Fosamax  or other health concerns     Return in about 3 months (around 12/22/2023) for CHRONIC F/U.  Rockie Agent, MD  [facility] [phone] (phone) [phone] (fax)  [facility]

## 2023-09-23 NOTE — Assessment & Plan Note (Signed)
  Osteoporosis Diagnosed with osteoporosis, confirmed by a recent bone density test showing decreased bone density compared to 2022. She is not on osteoporosis medication. Addressing bone density loss is crucial to reduce fracture risk. She is advised to continue a bariatric multivitamin, calcium  supplement, and engage in weight-bearing exercises. Fosamax  is recommended to slow bone density loss and reduce fracture risk. Potential side effects include achy bones or joints, and she should report any intolerable side effects. If Fosamax  is not tolerated, Prolia injections every six months may be considered. - Start Fosamax  70 mg once weekly - Continue bariatric multivitamin and calcium  supplement - Engage in weight-bearing exercises twice a week - Monitor for side effects of Fosamax  and report any intolerable symptoms

## 2023-09-24 LAB — QUANTIFERON-TB GOLD PLUS
QuantiFERON Mitogen Value: >10 [IU]/mL
QuantiFERON Nil Value: 0.04 [IU]/mL
QuantiFERON TB1 Ag Value: 0.04 [IU]/mL
QuantiFERON TB2 Ag Value: 0.04 [IU]/mL

## 2023-09-26 ENCOUNTER — Telehealth: Payer: Self-pay

## 2023-09-26 NOTE — Telephone Encounter (Addendum)
 Ppw exp 08/30/23 re faxed to treating doc for signature 09/26/23

## 2023-09-27 ENCOUNTER — Ambulatory Visit: Payer: Self-pay | Admitting: Family Medicine

## 2023-09-27 DIAGNOSIS — B351 Tinea unguium: Secondary | ICD-10-CM | POA: Diagnosis not present

## 2023-10-06 ENCOUNTER — Ambulatory Visit: Admitting: Family Medicine

## 2023-10-06 NOTE — Telephone Encounter (Signed)
 ppw rcvd re-signed re-dated and valid.. faxed to [employer]

## 2023-10-07 ENCOUNTER — Telehealth: Payer: Self-pay

## 2023-10-07 NOTE — Telephone Encounter (Signed)
 Lm for patient that ppw was received for DM shoes I will take with me to [place] and mail them to her on Monday. If she would like to pu here in [place] today she can but we will close at 2:30  Lolita Schultze Cped, CFo, CFm

## 2023-10-14 ENCOUNTER — Other Ambulatory Visit: Payer: Self-pay | Admitting: *Deleted

## 2023-10-14 ENCOUNTER — Telehealth: Admitting: *Deleted

## 2023-10-14 NOTE — Patient Outreach (Signed)
 Complex Care Management   Visit Note  10/14/2023  Name:  Joyce Brown MRN: [id number] DOB: April 09, 1950  Situation: Referral received for Complex Care Management related to Diabetes I obtained verbal consent from Patient.  Visit completed with patient  on the phone  Background:   Past Medical History:  Diagnosis Date   Arthritis 1995   Have family history of it.   Asthma 1995   Has resolved   Cataract 2017   Family History of them   Cerebellar ataxia (HCC)    Depression 1996   Due to Fibromyalgia.   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   Glaucoma Various   Family History of it.   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 Lane pending in September  HEENT HEENT Symptoms Reported: No symptoms reported      Cardiovascular Cardiovascular Symptoms Reported: No symptoms  reported Cardiovascular Management Strategies: Medication therapy, Routine screening  Respiratory Respiratory Symptoms Reported: No symptoms reported Respiratory Management Strategies: Routine screening  Endocrine Endocrine Symptoms Reported: No symptoms reported Is patient diabetic?: Yes Is patient checking blood sugars at home?: Yes List most recent blood sugar readings, include date and time of day: 10/14/2023 fasting 155 CBG Endocrine Self-Management Outcome: 5 (very good) Endocrine Comment: Pdg follow up with endocrinologist Sept 2025  Gastrointestinal Gastrointestinal Symptoms Reported: No symptoms reported      Genitourinary Genitourinary Symptoms Reported: No symptoms reported    Integumentary Integumentary Symptoms Reported: No symptoms reported Skin Management Strategies: Routine screening  Musculoskeletal Musculoskelatal Symptoms Reviewed: Unsteady gait Additional Musculoskeletal Details: Wears Diabetic shoes, hx of osteporosis (Fosamax ) Musculoskeletal Management Strategies: Routine screening, Medication therapy Musculoskeletal Self-Management Outcome: 4 (good) Falls in the past year?: Yes Number of falls in past year: 2 or more Was there an injury with Fall?: Yes Fall Risk Category Calculator: 3 Patient Fall Risk Level: High Fall Risk Patient at Risk for Falls Due to: History of fall(s), Impaired balance/gait Fall risk Follow up: Falls prevention discussed (Declined fall prevention education to be added to MyChart but receptive to today's vebral education.)  Psychosocial Psychosocial Symptoms Reported: No symptoms reported     Quality of Family Relationships: helpful, supportive Do you feel physically threatened by others?: No      10/14/2023   10:43 AM  Depression screen PHQ 2/9  Decreased Interest 0  Down, Depressed, Hopeless 0  PHQ - 2 Score 0    There were no vitals filed for this visit.  Medications Reviewed Today     Reviewed by Alvia Olam BIRCH, RN  (Registered Nurse) on 10/14/23 at 1028  Med List Status: <None>   Medication Order Taking? Sig Documenting Provider Last Dose Status Informant  Acetaminophen  500 MG capsule [id number] Yes Take 500 mg by mouth every 6 (six) hours as needed for moderate pain. [provider]  Active Self  alendronate  (FOSAMAX ) 70 MG tablet [id number] Yes Take 1 tablet (70 mg total) by mouth every 7 (seven) days. Take with a full glass of water on an empty stomach. Simmons-Robinson, Rockie, MD  Active   antiseptic oral rinse KEENAN) LIQD [id number] Yes 15 mLs by Mouth Rinse route in the morning, at noon, in the evening, and at bedtime. [provider]  Active Self  atorvastatin  (LIPITOR) 80 MG tablet [id number] Yes TAKE 1 TABLET (80 MG TOTAL) BY MOUTH DAILY. Ostwalt, Janna, PA-C  Active   BD INSULIN  SYRINGE U/F 1/2UNIT 31G X 5/16 0.3 ML MISC [id number] Yes USE 3 TIMES A DAY Shamleffer, Donell Cardinal, MD  Active Self  carboxymethylcellulose (REFRESH PLUS) 0.5 % SOLN [id number] Yes Place 1 drop into both eyes 3 (three) times daily as needed. [provider]  Active Self  Cholecalciferol (VITAMIN D3) 1000 units CAPS [id number] Yes Take by mouth. [provider]  Active   Clobetasol Propionate (IMPOYZ) 0.025 % CREA [id number] Yes Apply topically. Patient states applys 1 x per day. [provider]  Active Self  Continuous Blood Gluc Sensor (DEXCOM G7 SENSOR) MISC [id number] Yes by Does not apply route. [provider]  Active Self  Cyanocobalamin  (VITAMIN B-12 PO) [id number] Yes Take by mouth. [provider]  Active   docusate sodium (COLACE) 100 MG capsule [id number] Yes Take 100 mg by mouth daily as needed for mild constipation. [provider]  Active Self  estradiol (ESTRACE) 0.1 MG/GM vaginal cream [id number] Yes Place 1 Applicatorful vaginally 2 (two) times a week. [provider]  Active Self           Med Note HUMBERTO JHONNIE GORMAN Sonjia Jan 11, 2023  1:14 PM) Takes 0.5gm  Ferrous Sulfate (IRON PO) [id number] Yes Take by mouth. [provider]  Active   gabapentin  (NEURONTIN ) 100 MG capsule [id number] Yes Take 2 capsules (200 mg total) by mouth 3 (three) times daily. Simmons-Robinson, Makiera, MD  Active Self  Glucagon  (BAQSIMI  TWO PACK) 3 MG/DOSE POWD [id number] Yes Place 3 mg into the nose as needed (For hypoglycemia). Caleen Qualia, MD  Active   glucose blood test strip [id number] Yes Use [provider]  Active Self  insulin  aspart (NOVOLOG  PENFILL) cartridge [id number] Yes USE MAX OF DAILY DOSE OF 60 UNITS PER CORRECTION SCALE AS DIRECTED Shamleffer, Donell Cardinal, MD  Active Self  Insulin  Disposable Pump (OMNIPOD 5 DEXG7G6 PODS GEN 5) MISC [id number] Yes SMARTSIG:SUB-Q Every 3 Days [provider]  Active Self  Insulin  Pen Needle (DROPLET PEN NEEDLES) 31G X 5 MM MISC [id number] Yes USE 4 TIMES DAILY AS DIRECTED BY YOUR DOCTOR Shamleffer, Donell Cardinal, MD  Active Self  Microlet Lancets MISC [id number]  3 (three) times daily. [provider]  Active Self  Omega 3 1200 MG CAPS [id number]  Take by mouth. One daily  Patient not taking: Reported on 10/14/2023   [provider]  Active Self  sertraline  (ZOLOFT ) 100 MG tablet [id number] Yes TAKE 1 TABLET EVERY DAY Simmons-Robinson, Makiera, MD  Active Self  Sodium Fluoride  1.1 % PSTE [id number] Yes Use  1 Application Nightly after brushing/flossing. Spit out excess. No rinse/eat/drink for at least 30 minutes after application   Active Self  solifenacin  (VESICARE ) 10 MG tablet [id number] Yes Take 10 mg by mouth daily. [provider]  Active Self  Tavaborole 5 % SOLN [id number] Yes Apply topically. [provider]  Active Self  tiZANidine  (ZANAFLEX ) 2 MG tablet [id number] Yes TAKE 1 TO 2 TABLETS BY MOUTH AT BEDTIME AS NEEDED FOR MUSCLE SPASMS Simmons-Robinson, Makiera, MD  Active             Recommendation:   PCP Follow-up Continue  Current Plan of Care  Follow Up Plan:   Telephone follow up appointment date/time:  11/15/2023 2 1030   Olam Ku, RN, BSN [place]  [facility], [facility] RN Care Manager Direct Dial: [phone]  Fax: [phone]

## 2023-10-14 NOTE — Patient Instructions (Signed)
 Visit Information  Thank you for taking time to visit with me today. Please don't hesitate to contact me if I can be of assistance to you before our next scheduled appointment.  Your next care management appointment is by telephone on 11/15/2023 at 1030   Please call the care guide team at [phone] if you need to cancel, schedule, or reschedule an appointment.   Please call the Suicide and Crisis Lifeline: 988 call the [place]  National Suicide Prevention Lifeline: 228-713-8840 or TTY: 581-705-5319 TTY 901-087-7407) to talk to a trained counselor call 1-800-273-TALK (toll free, 24 hour hotline) if you are experiencing a Mental Health or Behavioral Health Crisis or need someone to talk to.   Olam Ku, RN, BSN [place]  [facility], [facility] RN Care Manager Direct Dial: [phone]  Fax: [phone]

## 2023-10-16 ENCOUNTER — Emergency Department

## 2023-10-16 ENCOUNTER — Inpatient Hospital Stay

## 2023-10-16 ENCOUNTER — Other Ambulatory Visit: Payer: Self-pay

## 2023-10-16 ENCOUNTER — Inpatient Hospital Stay
Admission: EM | Admit: 2023-10-16 | Discharge: 2023-10-26 | DRG: 638 | Disposition: A | Discharge status: Skilled Nursing Facility | Attending: Obstetrics and Gynecology | Admitting: Obstetrics and Gynecology

## 2023-10-16 DIAGNOSIS — Z9641 Presence of insulin pump (external) (internal): Secondary | ICD-10-CM | POA: Diagnosis present

## 2023-10-16 DIAGNOSIS — Z833 Family history of diabetes mellitus: Secondary | ICD-10-CM

## 2023-10-16 DIAGNOSIS — Z993 Dependence on wheelchair: Secondary | ICD-10-CM | POA: Diagnosis not present

## 2023-10-16 DIAGNOSIS — R008 Other abnormalities of heart beat: Secondary | ICD-10-CM | POA: Diagnosis not present

## 2023-10-16 DIAGNOSIS — M797 Fibromyalgia: Secondary | ICD-10-CM | POA: Diagnosis present

## 2023-10-16 DIAGNOSIS — F32A Depression, unspecified: Secondary | ICD-10-CM | POA: Diagnosis present

## 2023-10-16 DIAGNOSIS — E440 Moderate protein-calorie malnutrition: Secondary | ICD-10-CM | POA: Diagnosis present

## 2023-10-16 DIAGNOSIS — W19XXXA Unspecified fall, initial encounter: Secondary | ICD-10-CM

## 2023-10-16 DIAGNOSIS — J189 Pneumonia, unspecified organism: Secondary | ICD-10-CM

## 2023-10-16 DIAGNOSIS — E1010 Type 1 diabetes mellitus with ketoacidosis without coma: Secondary | ICD-10-CM | POA: Diagnosis present

## 2023-10-16 DIAGNOSIS — Z821 Family history of blindness and visual loss: Secondary | ICD-10-CM

## 2023-10-16 DIAGNOSIS — L89311 Pressure ulcer of right buttock, stage 1: Secondary | ICD-10-CM | POA: Diagnosis present

## 2023-10-16 DIAGNOSIS — E871 Hypo-osmolality and hyponatremia: Secondary | ICD-10-CM | POA: Diagnosis not present

## 2023-10-16 DIAGNOSIS — Z794 Long term (current) use of insulin: Secondary | ICD-10-CM

## 2023-10-16 DIAGNOSIS — G119 Hereditary ataxia, unspecified: Secondary | ICD-10-CM | POA: Diagnosis not present

## 2023-10-16 DIAGNOSIS — Z822 Family history of deafness and hearing loss: Secondary | ICD-10-CM

## 2023-10-16 DIAGNOSIS — E1110 Type 2 diabetes mellitus with ketoacidosis without coma: Secondary | ICD-10-CM | POA: Diagnosis not present

## 2023-10-16 DIAGNOSIS — Z83438 Family history of other disorder of lipoprotein metabolism and other lipidemia: Secondary | ICD-10-CM

## 2023-10-16 DIAGNOSIS — Z79899 Other long term (current) drug therapy: Secondary | ICD-10-CM

## 2023-10-16 DIAGNOSIS — L89321 Pressure ulcer of left buttock, stage 1: Secondary | ICD-10-CM | POA: Diagnosis present

## 2023-10-16 DIAGNOSIS — G4733 Obstructive sleep apnea (adult) (pediatric): Secondary | ICD-10-CM | POA: Diagnosis present

## 2023-10-16 DIAGNOSIS — I4510 Unspecified right bundle-branch block: Secondary | ICD-10-CM | POA: Diagnosis not present

## 2023-10-16 DIAGNOSIS — Z7983 Long term (current) use of bisphosphonates: Secondary | ICD-10-CM

## 2023-10-16 DIAGNOSIS — Z5901 Sheltered homelessness: Secondary | ICD-10-CM

## 2023-10-16 DIAGNOSIS — D509 Iron deficiency anemia, unspecified: Secondary | ICD-10-CM | POA: Diagnosis not present

## 2023-10-16 DIAGNOSIS — R4781 Slurred speech: Secondary | ICD-10-CM | POA: Diagnosis not present

## 2023-10-16 DIAGNOSIS — M7989 Other specified soft tissue disorders: Secondary | ICD-10-CM | POA: Diagnosis not present

## 2023-10-16 DIAGNOSIS — J9811 Atelectasis: Secondary | ICD-10-CM | POA: Diagnosis not present

## 2023-10-16 DIAGNOSIS — Z8249 Family history of ischemic heart disease and other diseases of the circulatory system: Secondary | ICD-10-CM

## 2023-10-16 DIAGNOSIS — E860 Dehydration: Secondary | ICD-10-CM | POA: Diagnosis not present

## 2023-10-16 DIAGNOSIS — G2582 Stiff-man syndrome: Secondary | ICD-10-CM | POA: Diagnosis present

## 2023-10-16 DIAGNOSIS — I2489 Other forms of acute ischemic heart disease: Secondary | ICD-10-CM | POA: Diagnosis present

## 2023-10-16 DIAGNOSIS — I6782 Cerebral ischemia: Secondary | ICD-10-CM | POA: Diagnosis not present

## 2023-10-16 DIAGNOSIS — E1065 Type 1 diabetes mellitus with hyperglycemia: Secondary | ICD-10-CM | POA: Diagnosis not present

## 2023-10-16 DIAGNOSIS — I700 Atherosclerosis of aorta: Secondary | ICD-10-CM | POA: Diagnosis not present

## 2023-10-16 DIAGNOSIS — Z6820 Body mass index (BMI) 20.0-20.9, adult: Secondary | ICD-10-CM | POA: Diagnosis not present

## 2023-10-16 DIAGNOSIS — N179 Acute kidney failure, unspecified: Secondary | ICD-10-CM | POA: Diagnosis present

## 2023-10-16 DIAGNOSIS — M47812 Spondylosis without myelopathy or radiculopathy, cervical region: Secondary | ICD-10-CM | POA: Diagnosis not present

## 2023-10-16 DIAGNOSIS — Z823 Family history of stroke: Secondary | ICD-10-CM

## 2023-10-16 DIAGNOSIS — Z7401 Bed confinement status: Secondary | ICD-10-CM | POA: Diagnosis not present

## 2023-10-16 DIAGNOSIS — L8990 Pressure ulcer of unspecified site, unspecified stage: Secondary | ICD-10-CM | POA: Insufficient documentation

## 2023-10-16 DIAGNOSIS — I10 Essential (primary) hypertension: Secondary | ICD-10-CM | POA: Diagnosis present

## 2023-10-16 DIAGNOSIS — R531 Weakness: Secondary | ICD-10-CM | POA: Diagnosis not present

## 2023-10-16 DIAGNOSIS — Z86711 Personal history of pulmonary embolism: Secondary | ICD-10-CM

## 2023-10-16 DIAGNOSIS — E785 Hyperlipidemia, unspecified: Secondary | ICD-10-CM | POA: Diagnosis present

## 2023-10-16 DIAGNOSIS — R739 Hyperglycemia, unspecified: Secondary | ICD-10-CM

## 2023-10-16 DIAGNOSIS — K219 Gastro-esophageal reflux disease without esophagitis: Secondary | ICD-10-CM | POA: Diagnosis present

## 2023-10-16 DIAGNOSIS — Z8261 Family history of arthritis: Secondary | ICD-10-CM

## 2023-10-16 DIAGNOSIS — Z9884 Bariatric surgery status: Secondary | ICD-10-CM

## 2023-10-16 DIAGNOSIS — J984 Other disorders of lung: Secondary | ICD-10-CM | POA: Diagnosis not present

## 2023-10-16 DIAGNOSIS — E875 Hyperkalemia: Secondary | ICD-10-CM | POA: Diagnosis not present

## 2023-10-16 DIAGNOSIS — M5020 Other cervical disc displacement, unspecified cervical region: Secondary | ICD-10-CM | POA: Diagnosis not present

## 2023-10-16 DIAGNOSIS — M47811 Spondylosis without myelopathy or radiculopathy, occipito-atlanto-axial region: Secondary | ICD-10-CM | POA: Diagnosis not present

## 2023-10-16 DIAGNOSIS — R918 Other nonspecific abnormal finding of lung field: Secondary | ICD-10-CM | POA: Diagnosis not present

## 2023-10-16 LAB — COMPREHENSIVE METABOLIC PANEL WITH GFR
ALT: 37 U/L (ref 0–44)
AST: 44 U/L — ABNORMAL HIGH (ref 15–41)
Albumin: 3.8 g/dL (ref 3.5–5.0)
Alkaline Phosphatase: 82 U/L (ref 38–126)
Anion gap: 29 — ABNORMAL HIGH (ref 5–15)
BUN: 42 mg/dL — ABNORMAL HIGH (ref 8–23)
CO2: 8 mmol/L — ABNORMAL LOW (ref 22–32)
Calcium: 8.7 mg/dL — ABNORMAL LOW (ref 8.9–10.3)
Chloride: 88 mmol/L — ABNORMAL LOW (ref 98–111)
Creatinine, Ser: 2.28 mg/dL — ABNORMAL HIGH (ref 0.44–1.00)
GFR, Estimated: 22 mL/min — ABNORMAL LOW (ref >60)
Glucose, Bld: 1171 mg/dL (ref 70–99)
Potassium: 6.7 mmol/L (ref 3.5–5.1)
Sodium: 125 mmol/L — ABNORMAL LOW (ref 135–145)
Total Bilirubin: 1 mg/dL (ref 0.0–1.2)
Total Protein: 6.5 g/dL (ref 6.5–8.1)

## 2023-10-16 LAB — CBC WITH DIFFERENTIAL/PLATELET
Abs Immature Granulocytes: 0.11 K/uL — ABNORMAL HIGH (ref 0.00–0.07)
Basophils Absolute: 0.1 K/uL (ref 0.0–0.1)
Basophils Relative: 1 %
Eosinophils Absolute: 0 K/uL (ref 0.0–0.5)
Eosinophils Relative: 0 %
HCT: 37.6 % (ref 36.0–46.0)
Hemoglobin: 10.6 g/dL — ABNORMAL LOW (ref 12.0–15.0)
Immature Granulocytes: 1 %
Lymphocytes Relative: 6 %
Lymphs Abs: 0.8 K/uL (ref 0.7–4.0)
MCH: 31.5 pg (ref 26.0–34.0)
MCHC: 28.2 g/dL — ABNORMAL LOW (ref 30.0–36.0)
MCV: 111.9 fL — ABNORMAL HIGH (ref 80.0–100.0)
Monocytes Absolute: 1.2 K/uL — ABNORMAL HIGH (ref 0.1–1.0)
Monocytes Relative: 9 %
Neutro Abs: 10.5 K/uL — ABNORMAL HIGH (ref 1.7–7.7)
Neutrophils Relative %: 83 %
Platelets: 204 K/uL (ref 150–400)
RBC: 3.36 MIL/uL — ABNORMAL LOW (ref 3.87–5.11)
RDW: 14.6 % (ref 11.5–15.5)
Smear Review: NORMAL
WBC: 12.7 K/uL — ABNORMAL HIGH (ref 4.0–10.5)
nRBC: 0 % (ref 0.0–0.2)

## 2023-10-16 LAB — CBG MONITORING, ED
Glucose-Capillary: >600 mg/dL (ref 70–99)
Glucose-Capillary: >600 mg/dL (ref 70–99)
Glucose-Capillary: >600 mg/dL (ref 70–99)
Glucose-Capillary: >600 mg/dL (ref 70–99)
Glucose-Capillary: >600 mg/dL (ref 70–99)

## 2023-10-16 LAB — BLOOD GAS, VENOUS
Acid-base deficit: 22.3 mmol/L — ABNORMAL HIGH (ref 0.0–2.0)
Bicarbonate: 7 mmol/L — ABNORMAL LOW (ref 20.0–28.0)
O2 Saturation: 69 %
Patient temperature: 37
pCO2, Ven: 26 mmHg — ABNORMAL LOW (ref 44–60)
pH, Ven: 7.04 — CL (ref 7.25–7.43)
pO2, Ven: 44 mmHg (ref 32–45)

## 2023-10-16 LAB — TROPONIN I (HIGH SENSITIVITY)
Troponin I (High Sensitivity): 98 ng/L — ABNORMAL HIGH (ref <18)
Troponin I (High Sensitivity): 114 ng/L (ref <18)
Troponin I (High Sensitivity): 329 ng/L (ref <18)
Troponin I (High Sensitivity): 346 ng/L (ref <18)

## 2023-10-16 LAB — BASIC METABOLIC PANEL WITH GFR
Anion gap: 22 — ABNORMAL HIGH (ref 5–15)
BUN: 45 mg/dL — ABNORMAL HIGH (ref 8–23)
CO2: 12 mmol/L — ABNORMAL LOW (ref 22–32)
Calcium: 8.4 mg/dL — ABNORMAL LOW (ref 8.9–10.3)
Chloride: 97 mmol/L — ABNORMAL LOW (ref 98–111)
Creatinine, Ser: 2.08 mg/dL — ABNORMAL HIGH (ref 0.44–1.00)
GFR, Estimated: 25 mL/min — ABNORMAL LOW (ref >60)
Glucose, Bld: 842 mg/dL (ref 70–99)
Potassium: 5.2 mmol/L — ABNORMAL HIGH (ref 3.5–5.1)
Sodium: 131 mmol/L — ABNORMAL LOW (ref 135–145)

## 2023-10-16 LAB — GLUCOSE, CAPILLARY
Glucose-Capillary: 444 mg/dL — ABNORMAL HIGH (ref 70–99)
Glucose-Capillary: 495 mg/dL — ABNORMAL HIGH (ref 70–99)
Glucose-Capillary: 498 mg/dL — ABNORMAL HIGH (ref 70–99)
Glucose-Capillary: >600 mg/dL (ref 70–99)
Glucose-Capillary: >600 mg/dL (ref 70–99)
Glucose-Capillary: >600 mg/dL (ref 70–99)
Glucose-Capillary: >600 mg/dL (ref 70–99)
Glucose-Capillary: >600 mg/dL (ref 70–99)
Glucose-Capillary: >600 mg/dL (ref 70–99)
Glucose-Capillary: >600 mg/dL (ref 70–99)
Glucose-Capillary: >600 mg/dL (ref 70–99)
Glucose-Capillary: >600 mg/dL (ref 70–99)
Glucose-Capillary: >600 mg/dL (ref 70–99)

## 2023-10-16 LAB — MRSA NEXT GEN BY PCR, NASAL: MRSA by PCR Next Gen: NOT DETECTED

## 2023-10-16 LAB — LACTIC ACID, PLASMA
Lactic Acid, Venous: 4.1 mmol/L (ref 0.5–1.9)
Lactic Acid, Venous: 3.9 mmol/L (ref 0.5–1.9)
Lactic Acid, Venous: 2.5 mmol/L (ref 0.5–1.9)

## 2023-10-16 LAB — URINALYSIS, W/ REFLEX TO CULTURE (INFECTION SUSPECTED)
Bilirubin Urine: NEGATIVE
Glucose, UA: >=500 mg/dL — AB
Ketones, ur: 20 mg/dL — AB
Leukocytes,Ua: NEGATIVE
Nitrite: NEGATIVE
Protein, ur: NEGATIVE mg/dL
Specific Gravity, Urine: 1.023 (ref 1.005–1.030)
pH: 5 (ref 5.0–8.0)

## 2023-10-16 LAB — BETA-HYDROXYBUTYRIC ACID
Beta-Hydroxybutyric Acid: >8 mmol/L — ABNORMAL HIGH (ref 0.05–0.27)
Beta-Hydroxybutyric Acid: >8 mmol/L — ABNORMAL HIGH (ref 0.05–0.27)
Beta-Hydroxybutyric Acid: >8 mmol/L — ABNORMAL HIGH (ref 0.05–0.27)

## 2023-10-16 LAB — PROCALCITONIN: Procalcitonin: 1.08 ng/mL

## 2023-10-16 LAB — PROTIME-INR
INR: 1.2 (ref 0.8–1.2)
Prothrombin Time: 15.7 s — ABNORMAL HIGH (ref 11.4–15.2)

## 2023-10-16 LAB — CK: Total CK: 330 U/L — ABNORMAL HIGH (ref 38–234)

## 2023-10-16 MED ORDER — SERTRALINE HCL 50 MG PO TABS
100.0000 mg | ORAL_TABLET | Freq: Every day | ORAL | Status: DC
  Administered 2023-10-17 – 2023-10-26 (×13): 100 mg via ORAL
  Filled 2023-10-16 (×11): qty 2

## 2023-10-16 MED ORDER — SODIUM CHLORIDE 0.9 % IV SOLN
2.0000 g | INTRAVENOUS | Status: DC
  Administered 2023-10-17: 2 g via INTRAVENOUS
  Filled 2023-10-16: qty 20

## 2023-10-16 MED ORDER — LACTATED RINGERS IV SOLN: INTRAVENOUS | Status: DC

## 2023-10-16 MED ORDER — LEVALBUTEROL HCL 0.63 MG/3ML IN NEBU: 0.6300 mg | INHALATION_SOLUTION | Freq: Four times a day (QID) | RESPIRATORY_TRACT | Status: DC | PRN

## 2023-10-16 MED ORDER — DOCUSATE SODIUM 100 MG PO CAPS: 100.0000 mg | ORAL_CAPSULE | Freq: Every day | ORAL | Status: DC | PRN

## 2023-10-16 MED ORDER — SERTRALINE HCL 50 MG PO TABS: 100.0000 mg | ORAL_TABLET | Freq: Every day | ORAL | Status: DC

## 2023-10-16 MED ORDER — LACTATED RINGERS IV BOLUS
1000.0000 mL | Freq: Once | INTRAVENOUS | Status: AC
  Administered 2023-10-16: 1000 mL via INTRAVENOUS

## 2023-10-16 MED ORDER — HEPARIN SODIUM (PORCINE) 5000 UNIT/ML IJ SOLN
5000.0000 [IU] | Freq: Three times a day (TID) | INTRAMUSCULAR | Status: DC
  Administered 2023-10-16 – 2023-10-17 (×3): 5000 [IU] via SUBCUTANEOUS
  Filled 2023-10-16 (×3): qty 1

## 2023-10-16 MED ORDER — ORAL CARE MOUTH RINSE
15.0000 mL | OROMUCOSAL | Status: DC
  Administered 2023-10-16 – 2023-10-23 (×22): 15 mL via OROMUCOSAL

## 2023-10-16 MED ORDER — INSULIN REGULAR(HUMAN) IN NACL 100-0.9 UT/100ML-% IV SOLN
INTRAVENOUS | Status: DC
  Administered 2023-10-16: 7 [IU]/h via INTRAVENOUS
  Administered 2023-10-17: 4.2 [IU]/h via INTRAVENOUS
  Filled 2023-10-16 (×2): qty 100

## 2023-10-16 MED ORDER — VANCOMYCIN HCL 1750 MG/350ML IV SOLN
1750.0000 mg | Freq: Once | INTRAVENOUS | Status: AC
  Administered 2023-10-16: 1750 mg via INTRAVENOUS
  Filled 2023-10-16: qty 350

## 2023-10-16 MED ORDER — SODIUM CHLORIDE 0.9 % IV SOLN
500.0000 mg | Freq: Every day | INTRAVENOUS | Status: DC
  Administered 2023-10-16: 500 mg via INTRAVENOUS
  Filled 2023-10-16: qty 5

## 2023-10-16 MED ORDER — ATORVASTATIN CALCIUM 80 MG PO TABS
80.0000 mg | ORAL_TABLET | Freq: Every day | ORAL | Status: DC
  Administered 2023-10-17 – 2023-10-26 (×13): 80 mg via ORAL
  Filled 2023-10-16 (×10): qty 1

## 2023-10-16 MED ORDER — CHLORHEXIDINE GLUCONATE CLOTH 2 % EX PADS
6.0000 | MEDICATED_PAD | Freq: Every day | CUTANEOUS | Status: DC
  Administered 2023-10-17 – 2023-10-18 (×2): 6 via TOPICAL

## 2023-10-16 MED ORDER — DEXTROSE 50 % IV SOLN: 0.0000 mL | INTRAVENOUS | Status: DC | PRN

## 2023-10-16 MED ORDER — FESOTERODINE FUMARATE ER 4 MG PO TB24
4.0000 mg | ORAL_TABLET | Freq: Every day | ORAL | Status: DC
  Administered 2023-10-17 – 2023-10-26 (×13): 4 mg via ORAL
  Filled 2023-10-16 (×11): qty 1

## 2023-10-16 MED ORDER — SODIUM CHLORIDE 0.9 % IV BOLUS
1000.0000 mL | Freq: Once | INTRAVENOUS | Status: AC
  Administered 2023-10-16: 1000 mL via INTRAVENOUS

## 2023-10-16 MED ORDER — LACTATED RINGERS IV SOLN: INTRAVENOUS | Status: AC

## 2023-10-16 MED ORDER — ASPIRIN 81 MG PO TBEC
81.0000 mg | DELAYED_RELEASE_TABLET | Freq: Every day | ORAL | Status: DC
  Administered 2023-10-17 – 2023-10-26 (×13): 81 mg via ORAL
  Filled 2023-10-16 (×10): qty 1

## 2023-10-16 MED ORDER — VANCOMYCIN HCL IN DEXTROSE 1-5 GM/200ML-% IV SOLN: 1000.0000 mg | Freq: Once | INTRAVENOUS | Status: DC

## 2023-10-16 MED ORDER — ASPIRIN 325 MG PO TABS: 325.0000 mg | ORAL_TABLET | Freq: Once | ORAL | Status: DC

## 2023-10-16 MED ORDER — DEXTROSE IN LACTATED RINGERS 5 % IV SOLN: INTRAVENOUS | Status: AC

## 2023-10-16 MED ORDER — ORAL CARE MOUTH RINSE: 15.0000 mL | OROMUCOSAL | Status: DC | PRN

## 2023-10-16 MED ORDER — SODIUM CHLORIDE 0.9 % IV SOLN
2.0000 g | Freq: Once | INTRAVENOUS | Status: AC
  Administered 2023-10-16: 2 g via INTRAVENOUS
  Filled 2023-10-16: qty 12.5

## 2023-10-16 NOTE — ED Notes (Signed)
 CBG reading high, Dr Suzanne informed

## 2023-10-16 NOTE — H&P (Signed)
 History and Physical    Joyce Brown FMW:[id number] DOB: 1950/05/03 DOA: 10/16/2023  PCP: Sharma Coyer, MD  Patient coming from: hotel  I have personally briefly reviewed patient's old medical records in [facility]  Chief Complaint: found done after fall   HPI: Joyce Brown is a 73 y.o. female with medical history significant of DM type 1, Asthma, Cerebellar Ataxia wheel chair bound, fibromyalgia, GERD, HLD, HTN  OSA on CPAP,  remote s/p tx PE, Stiff person syndrome who presents to ED s/p being found down in hotel after sister called EMS for well as patient did not sound her self on phone call. Patient states she was attempting to transfer herself from her bed to chair and fell.  Patient notes prior to this she was at her baseline health although on ros she has noted today she has had increase in sob. She notes he sugar was within range the last time she checked it.  Patient has insulin  pump.  She notes no hx of CVA and no new weakness and noted her speech at times is slurred and her sister corroborates this. Patient on further ros noted no cough/fever/chills /n/v/d/ dysuria or abdominal pain. She notes she is at her baseline strength she does not note new paresthesias. She states current she feels much improved from this am.   ED Course:  Vitals afeb , bp 103/54, hr 70, rr 22, sat 99%  Glucose >600 Lactic 4.1 ,3.9 Beta -hydroxy>8 Na 125, K 6.7, cl 88, bicarb 8, glu 1171, cr 2.28 ( 0.93)  Wbc 12.7, hgb 10.6,  pmn 10.5 Vbg: 7/26 NS  1L EKG: snr  incomplete RBBB CE 98, 114 Latic 3.9 Cxr  IMPRESSION: 1. Question developing right lower lung zone airspace opacity. Recommend further evaluation with chest x-ray PA and lateral view. 2.  Aortic Atherosclerosis (ICD10-I70.0).   Head CT:   1. No evidence of an acute intracranial abnormality. 2. Mild parenchymal atrophy and chronic small vessel ischemic disease.   CT cervical spine:   1. No evidence of an  acute cervical spine fracture. 2. Mild grade 1 anterolisthesis at C3-C4, C4-C5 and C7-T1. 3. Cervical spondylosis as described. Tx insulin  drip , cefepime ,vanc ,    Review of Systems: As per HPI otherwise 10 point review of systems negative.   Past Medical History:  Diagnosis Date   Arthritis 1995   Have family history of it.   Asthma 1995   Has resolved   Cataract 2017   Family History of them   Cerebellar ataxia (HCC)    Depression 1996   Due to Fibromyalgia.   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   Glaucoma Various   Family History of it.   History of vaginal hysterectomy 05/24/2014   Hyperlipidemia    Hypertension 1985   Medication used until 1996, sometimes have had high readings since 2021   Neuromuscular disorder [facility]) 2006   2006 to 2019 Stiff Person Syndrome.  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    Past Surgical History:  Procedure Laterality Date   ABDOMINAL  HYSTERECTOMY     ABDOMINAL HYSTERECTOMY  1997   Partial Hysterectomy   ADENOIDECTOMY     ANKLE SURGERY Left    BILATERAL CARPAL TUNNEL RELEASE     BREAST BIOPSY Left 2019   benign   COLON SURGERY  09/25/2012   Gastric Bypass Surgery   COLONOSCOPY WITH PROPOFOL  N/A 03/24/2023   Procedure: COLONOSCOPY WITH PROPOFOL ;  Surgeon: Unk Corinn Skiff, MD;  Location: [facility] ENDOSCOPY;  Service: Gastroenterology;  Laterality: N/A;   ELBOW SURGERY     EYE SURGERY  03/14/22 & 03/17/22   Cataract Surgery   GASTRIC BYPASS  2014   HAND NEUROPLASTY     NASAL SINUS SURGERY     POLYPECTOMY  03/24/2023   Procedure: POLYPECTOMY;   Surgeon: Unk Corinn Skiff, MD;  Location: [facility] ENDOSCOPY;  Service: Gastroenterology;;   radio denervation  05/21/2020   SMALL INTESTINE SURGERY  08/18/2013   Laparoscopic Procedure to Relieve Small Bowel Obstruction   TONSILLECTOMY       reports that she has never smoked. She has never been exposed to tobacco smoke. She has never used smokeless tobacco. She reports that she does not drink alcohol  and does not use drugs.  No Known Allergies  Family History  Problem Relation Age of Onset   Cervical cancer Mother    Ovarian cancer Mother    Arthritis Mother    Cancer Mother    Dementia Father    Retinal detachment Father    Heart attack Father    Diabetes Father    Stroke Father    Hypertension Father    Alcohol  abuse Father    Arthritis Father    Hearing loss Father    Hyperlipidemia Father    Ovarian cancer Sister    Arthritis Sister    Arthritis Sister    Arthritis Sister    Arthritis Sister    Glaucoma Paternal Grandfather    Arthritis Paternal Grandfather    Depression Paternal Grandfather    Hearing loss Paternal Grandfather    Diabetes Paternal Grandmother    Arthritis Sister    Arthritis Sister    Cancer Sister    Arthritis Sister    Diabetes Maternal Uncle    Vision loss Maternal Uncle    Arthritis Maternal Grandmother    Arthritis Sister    Diabetes Maternal Uncle    Vision loss Maternal Uncle     Prior to Admission medications   Medication Sig Start Date End Date Taking? Authorizing Provider  Acetaminophen  500 MG capsule Take 500 mg by mouth every 6 (six) hours as needed for moderate pain.   Yes [provider]  alendronate  (FOSAMAX ) 70 MG tablet Take 1 tablet (70 mg total) by mouth every 7 (seven) days. Take with a full glass of water on an empty stomach. 09/21/23  Yes Simmons-Robinson, Makiera, MD  atorvastatin  (LIPITOR ) 80 MG tablet TAKE 1 TABLET (80 MG TOTAL) BY MOUTH DAILY. 06/06/23  Yes Ostwalt, Janna, PA-C  carboxymethylcellulose  (REFRESH PLUS) 0.5 % SOLN Place 1 drop into both eyes 3 (three) times daily as needed.   Yes [provider]  Cyanocobalamin  (VITAMIN B-12 PO) Take by mouth.   Yes [provider]  docusate sodium  (COLACE) 100 MG capsule Take 100 mg by mouth daily as needed for mild constipation.   Yes [provider]  estradiol (ESTRACE) 0.1 MG/GM vaginal cream Place 1 Applicatorful vaginally 2 (two) times a week.   Yes [provider]  Glucagon  (BAQSIMI  TWO PACK) 3 MG/DOSE  POWD Place 3 mg into the nose as needed (For hypoglycemia). 04/11/23  Yes Caleen Qualia, MD  insulin  aspart (NOVOLOG  PENFILL) cartridge USE MAX OF DAILY DOSE OF 60 UNITS PER CORRECTION SCALE AS DIRECTED 09/06/22  Yes Shamleffer, Ibtehal Jaralla, MD  Insulin  Disposable Pump (OMNIPOD 5 DEXG7G6 PODS GEN 5) MISC SMARTSIG:SUB-Q Every 3 Days   Yes [provider]  Multiple Vitamins-Minerals (CENTRUM SILVER ADULT 50+) TABS Take 1 tablet by mouth daily.   Yes [provider]  sertraline  (ZOLOFT ) 100 MG tablet TAKE 1 TABLET EVERY DAY 02/24/23  Yes Simmons-Robinson, Makiera, MD  Sodium Fluoride  1.1 % PSTE Use 1 Application Nightly after brushing/flossing. Spit out excess. No rinse/eat/drink for at least 30 minutes after application 12/30/22  Yes   solifenacin  (VESICARE ) 10 MG tablet Take 10 mg by mouth daily.   Yes [provider]  tiZANidine  (ZANAFLEX ) 2 MG tablet TAKE 1 TO 2 TABLETS BY MOUTH AT BEDTIME AS NEEDED FOR MUSCLE SPASMS 05/12/23  Yes Simmons-Robinson, Makiera, MD  antiseptic oral rinse (BIOTENE) LIQD 15 mLs by Mouth Rinse route in the morning, at noon, in the evening, and at bedtime.    [provider]  BD INSULIN  SYRINGE U/F 1/2UNIT 31G X 5/16 0.3 ML MISC USE 3 TIMES A DAY 11/04/21   Shamleffer, Ibtehal Jaralla, MD  Cholecalciferol (VITAMIN D3) 1000 units CAPS Take by mouth. Patient not taking: Reported on 10/16/2023    [provider]  Clobetasol Propionate (IMPOYZ)  0.025 % CREA Apply topically. Patient states applys 1 x per day.    [provider]  Continuous Blood Gluc Sensor (DEXCOM G7 SENSOR) MISC by Does not apply route.    [provider]  Ferrous Sulfate (IRON  PO) Take by mouth. Patient not taking: Reported on 10/16/2023    [provider]  gabapentin  (NEURONTIN ) 100 MG capsule Take 2 capsules (200 mg total) by mouth 3 (three) times daily. 01/11/23   Simmons-Robinson, Rockie, MD  glucose blood test strip Use    [provider]  Insulin  Pen Needle (DROPLET PEN NEEDLES) 31G X 5 MM MISC USE 4 TIMES DAILY AS DIRECTED BY YOUR DOCTOR 01/26/23   Shamleffer, Ibtehal Jaralla, MD  Microlet Lancets MISC 3 (three) times daily. 08/26/17   [provider]  Omega 3 1200 MG CAPS Take by mouth. One daily Patient not taking: Reported on 09/21/2023    [provider]  Tavaborole 5 % SOLN Apply topically.    [provider]    Physical Exam: Vitals:   10/16/23 1243 10/16/23 1256 10/16/23 1400 10/16/23 1600  BP:    116/65  Pulse:   66 74  Resp:   20 18  Temp:  98 F (36.7 C)    TempSrc:  Axillary    SpO2:   100% 98%  Weight: 78.9 kg     Height: 5' 10 (1.778 m)       Constitutional: NAD, calm, comfortable Vitals:   10/16/23 1243 10/16/23 1256 10/16/23 1400 10/16/23 1600  BP:    116/65  Pulse:   66 74  Resp:   20 18  Temp:  98 F (36.7 C)    TempSrc:  Axillary    SpO2:   100% 98%  Weight: 78.9 kg     Height: 5' 10 (1.778 m)      Eyes: PERRL, lids and conjunctivae normal ENMT: Mucous membranes are dry Neck: normal, supple, no masses, no thyromegaly Respiratory: clear to auscultation bilaterally, no wheezing, no crackles. Normal respiratory  effort. No accessory muscle use.  Cardiovascular: Regular rate and rhythm, no murmurs / rubs / gallops. No extremity edema. 2+ pedal pulses. Abdomen: no tenderness, no masses palpated. No hepatosplenomegaly. Bowel sounds positive.  Musculoskeletal: no  clubbing / cyanosis. No joint deformity upper and lower extremities. decrease ROM, no contractures. 4/5 all ext Skin: no rashes, lesions, ulcers. No induration Neurologic: CN grossly intact. Sensation intact, Psychiatric: Normal judgment and insight. Alert and oriented x 3. Normal mood.    Labs on Admission: I have personally reviewed following labs and imaging studies  CBC: Recent Labs  Lab 10/16/23 1245  WBC 12.7*  NEUTROABS 10.5*  HGB 10.6*  HCT 37.6  MCV 111.9*  PLT 204   Basic Metabolic Panel: Recent Labs  Lab 10/16/23 1245  NA 125*  K 6.7*  CL 88*  CO2 8*  GLUCOSE 1,171*  BUN 42*  CREATININE 2.28*  CALCIUM  8.7*   GFR: Estimated Creatinine Clearance: 24.5 mL/min (A) (by C-G formula based on SCr of 2.28 mg/dL (H)). Liver Function Tests: Recent Labs  Lab 10/16/23 1245  AST 44*  ALT 37  ALKPHOS 82  BILITOT 1.0  PROT 6.5  ALBUMIN 3.8   No results for input(s): LIPASE, AMYLASE in the last 168 hours. No results for input(s): AMMONIA in the last 168 hours. Coagulation Profile: Recent Labs  Lab 10/16/23 1509  INR 1.2   Cardiac Enzymes: Recent Labs  Lab 10/16/23 1245  CKTOTAL 330*   BNP (last 3 results) No results for input(s): PROBNP in the last 8760 hours. HbA1C: No results for input(s): HGBA1C in the last 72 hours. CBG: Recent Labs  Lab 10/16/23 1238 10/16/23 1451  GLUCAP >600* >600*   Lipid Profile: No results for input(s): CHOL, HDL, LDLCALC, TRIG, CHOLHDL, LDLDIRECT in the last 72 hours. Thyroid  Function Tests: No results for input(s): TSH, T4TOTAL, FREET4, T3FREE, THYROIDAB in the last 72 hours. Anemia Panel: No results for input(s): VITAMINB12, FOLATE, FERRITIN, TIBC, IRON , RETICCTPCT in the last 72 hours. Urine analysis:    Component Value Date/Time   COLORURINE YELLOW (A) 04/10/2023 2127   APPEARANCEUR CLEAR (A) 04/10/2023 2127   LABSPEC >1.046 (H) 04/10/2023 2127   PHURINE 5.0  04/10/2023 2127   GLUCOSEU >=500 (A) 04/10/2023 2127   HGBUR NEGATIVE 04/10/2023 2127   BILIRUBINUR NEGATIVE 04/10/2023 2127   KETONESUR NEGATIVE 04/10/2023 2127   PROTEINUR NEGATIVE 04/10/2023 2127   NITRITE NEGATIVE 04/10/2023 2127   LEUKOCYTESUR NEGATIVE 04/10/2023 2127    Radiological Exams on Admission: DG Chest 2 View Result Date: 10/16/2023 CLINICAL DATA:  Right basilar opacity on earlier chest x-ray, questionable sepsis EXAM: CHEST - 2 VIEW COMPARISON:  10/16/2023 FINDINGS: Frontal and lateral views of the chest are obtained on 3 images. Cardiac silhouette is unremarkable. There is patchy right lower lobe airspace disease consistent with pneumonia. No effusion or pneumothorax. No acute bony abnormalities. IMPRESSION: 1. Patchy right lower lobe airspace disease consistent with bronchopneumonia. Electronically Signed   By: Ozell Daring M.D.   On: 10/16/2023 15:31   CT Head Wo Contrast Result Date: 10/16/2023 CLINICAL DATA:  Provided history: Neuro deficit, acute, stroke suspected. Slurred speech. Patient found down. Neck trauma. EXAM: CT HEAD WITHOUT CONTRAST CT CERVICAL SPINE WITHOUT CONTRAST TECHNIQUE: Multidetector CT imaging of the head and cervical spine was performed following the standard protocol without intravenous contrast. Multiplanar CT image reconstructions of the cervical spine were also generated. RADIATION DOSE REDUCTION: This exam was performed according to the departmental dose-optimization program which includes automated exposure control,  adjustment of the mA and/or kV according to patient size and/or use of iterative reconstruction technique. COMPARISON:  Non-contrast head CT and CT angiogram head/neck 04/10/2023. FINDINGS: CT HEAD FINDINGS Brain: Mild generalized parenchymal atrophy. Patchy and ill-defined hypoattenuation within the cerebral white matter, nonspecific but compatible with mild chronic small vessel ischemic disease. There is no acute intracranial hemorrhage.  No demarcated cortical infarct. No extra-axial fluid collection. No evidence of an intracranial mass. No midline shift. Vascular: No hyperdense vessel.  Atherosclerotic calcifications. Skull: No calvarial fracture or aggressive osseous lesion. Sinuses/Orbits: No mass or acute finding within the imaged orbits. Postsurgical appearance of the paranasal sinuses. No significant paranasal sinus disease. CT CERVICAL SPINE FINDINGS Alignment: Slight grade 1 anterolisthesis at C3-C4 and C4-C5. 2 mm C7-T1 grade 1 anterolisthesis. Skull base and vertebrae: The basion-dental and atlanto-dental intervals are maintained.No evidence of acute fracture to the cervical spine. Soft tissues and spinal canal: No prevertebral fluid or swelling. No visible canal hematoma. Disc levels: Cervical spondylosis with multilevel disc space narrowing, disc bulges/central disc protrusions, uncovertebral hypertrophy and facet arthropathy. Disc space narrowing is greatest at C6-C7 (advanced at this level). C6-C7 posterior disc osteophyte complex. No appreciable high-grade spinal canal stenosis. Multilevel bony neural foraminal narrowing. Multilevel ventral osteophytes. Degenerative changes also present at the C1-C2 articulation. Upper chest: No consolidation within the imaged lung apices. No visible pneumothorax. IMPRESSION: Head CT: 1. No evidence of an acute intracranial abnormality. 2. Mild parenchymal atrophy and chronic small vessel ischemic disease. CT cervical spine: 1. No evidence of an acute cervical spine fracture. 2. Mild grade 1 anterolisthesis at C3-C4, C4-C5 and C7-T1. 3. Cervical spondylosis as described. Electronically Signed   By: Rockey Childs D.O.   On: 10/16/2023 14:01   CT Cervical Spine Wo Contrast Result Date: 10/16/2023 CLINICAL DATA:  Provided history: Neuro deficit, acute, stroke suspected. Slurred speech. Patient found down. Neck trauma. EXAM: CT HEAD WITHOUT CONTRAST CT CERVICAL SPINE WITHOUT CONTRAST TECHNIQUE:  Multidetector CT imaging of the head and cervical spine was performed following the standard protocol without intravenous contrast. Multiplanar CT image reconstructions of the cervical spine were also generated. RADIATION DOSE REDUCTION: This exam was performed according to the departmental dose-optimization program which includes automated exposure control, adjustment of the mA and/or kV according to patient size and/or use of iterative reconstruction technique. COMPARISON:  Non-contrast head CT and CT angiogram head/neck 04/10/2023. FINDINGS: CT HEAD FINDINGS Brain: Mild generalized parenchymal atrophy. Patchy and ill-defined hypoattenuation within the cerebral white matter, nonspecific but compatible with mild chronic small vessel ischemic disease. There is no acute intracranial hemorrhage. No demarcated cortical infarct. No extra-axial fluid collection. No evidence of an intracranial mass. No midline shift. Vascular: No hyperdense vessel.  Atherosclerotic calcifications. Skull: No calvarial fracture or aggressive osseous lesion. Sinuses/Orbits: No mass or acute finding within the imaged orbits. Postsurgical appearance of the paranasal sinuses. No significant paranasal sinus disease. CT CERVICAL SPINE FINDINGS Alignment: Slight grade 1 anterolisthesis at C3-C4 and C4-C5. 2 mm C7-T1 grade 1 anterolisthesis. Skull base and vertebrae: The basion-dental and atlanto-dental intervals are maintained.No evidence of acute fracture to the cervical spine. Soft tissues and spinal canal: No prevertebral fluid or swelling. No visible canal hematoma. Disc levels: Cervical spondylosis with multilevel disc space narrowing, disc bulges/central disc protrusions, uncovertebral hypertrophy and facet arthropathy. Disc space narrowing is greatest at C6-C7 (advanced at this level). C6-C7 posterior disc osteophyte complex. No appreciable high-grade spinal canal stenosis. Multilevel bony neural foraminal narrowing. Multilevel ventral  osteophytes. Degenerative changes also present  at the C1-C2 articulation. Upper chest: No consolidation within the imaged lung apices. No visible pneumothorax. IMPRESSION: Head CT: 1. No evidence of an acute intracranial abnormality. 2. Mild parenchymal atrophy and chronic small vessel ischemic disease. CT cervical spine: 1. No evidence of an acute cervical spine fracture. 2. Mild grade 1 anterolisthesis at C3-C4, C4-C5 and C7-T1. 3. Cervical spondylosis as described. Electronically Signed   By: Rockey Childs D.O.   On: 10/16/2023 14:01   DG Chest Port 1 View Result Date: 10/16/2023 CLINICAL DATA:  Questionable sepsis - evaluate for abnormality EXAM: PORTABLE CHEST 1 VIEW COMPARISON:  Chest x-ray 04/10/2023. FINDINGS: The heart and mediastinal contours are unchanged. Atherosclerotic plaque. Question developing right lower lung zone airspace opacity. No pulmonary edema. No pleural effusion. No pneumothorax. No acute osseous abnormality. IMPRESSION: 1. Question developing right lower lung zone airspace opacity. Recommend further evaluation with chest x-ray PA and lateral view. 2.  Aortic Atherosclerosis (ICD10-I70.0). Electronically Signed   By: Morgane  Naveau M.D.   On: 10/16/2023 13:15    EKG: Independently reviewed.   Assessment/Plan  Type1 DM with DKA -presumed due to sepsis from probable cap -place on insulin  drip per  dka protocol  - bmp q4h  -repeat abg now  -ivfs per protocol  -strict I/o     CAP with Sepsis without hypoxemia  -patient with increase wbc and  Opacities on cxr unable to r/o infection  -ctx/ azithromycin , de-escalate as able  -pulmonary toilet  -CT thorax for further evaluation  -urine ag, sputum, f/u on culture data  -follow inflammatory markers -RVP pending  Abn CE  -due to demand  -noted no significant delta  -will continue to monitor ce over night to be complete, echo ordered for am   AKI  -insetting of sepsis dehydration  -patient denies volume loss and  states prior to episode she was eating and drinking well  - strict I/o   Hyperkalemia  -due to lack of insulin  /aki - continue in DKA protocol  - q4h bmp   Psuedo-hyponatremia  -monitor labs  Asthma -prn nebs  -resume medications per med rec once completed    Cerebellar Ataxia/Stiff Man syndrome - wheel chair bound -on gabapentin   -will hold in setting of aki  -continue sertraline  100mg  at bedtime  -PT/OT     Fibromyalgia -hold medications currently due to noted derangements    GERD -ppi    HLD -resume one takin po    HTN -stable  -resume one med rec completed and bp can tolerate    OSA  -on CPAP  DVT prophylaxis:  Code Status: full/ as discussed per patient wishes in event of cardiac arrest  Family Communication:  none at bedside Disposition Plan: patient  expected to be admitted greater than 2 midnights  Consults called: none Admission status: .stepdown ICU , Dr Fernand updated, will also sign out to night provider    Camila DELENA Ned MD [facility]   If 7PM-7AM, please contact night-coverage [email or web address] Password [facility]  10/16/2023, 4:41 PM

## 2023-10-16 NOTE — Progress Notes (Incomplete Revision)
 2130 - Upon reviewing orders, pt noted to have 2 orders for LR, both of which are documented as running. LR @ 150 running upon assuming care of patient. Message sent to provider requesting clarification. Provider also notified that, CBG continue to read HIGH, lab has been having difficulty obtaining blood samples, and troponins continue to trend up. EKG obtained. Pt denies c/o chest pain/discomfort. Awaiting response.   2158 - Order received to D/C LR @ 150 and continue LR @ 125 - see EMAR for updates. No additional orders received at this time.

## 2023-10-16 NOTE — Consult Note (Signed)
 CODE SEPSIS - PHARMACY COMMUNICATION  **Broad Spectrum Antibiotics should be administered within 1 hour of Sepsis diagnosis**  Time Code Sepsis Called/Page Received: 1350  Antibiotics Ordered: vancomycin , cefepime   Time of 1st antibiotic administration: 1416  Additional action taken by pharmacy: n/a  If necessary, Name of Provider/Nurse Contacted: n/a    Sharvi Mooneyhan A Amelita Risinger ,PharmD Clinical Pharmacist  10/16/2023  2:00 PM

## 2023-10-16 NOTE — ED Provider Notes (Signed)
 [facility] Provider Note    None    (approximate)   History   Fall and Hyperglycemia   HPI  Joyce Brown is a 73 y.o. female past medical history significant for diabetes, cerebellar ataxia, presents to the emergency department after being found down.  Patient does not recall the events of the fall.  States that she believes she was trying to transfer to her wheelchair.  Her sister called and felt like her speech was slurred and that her insulin  had not been hooked up so she called for a wellness check.  When EMS arrived patient was on the ground with unknown downtime.  Currently staying in a hotel waiting to go to possible [place] house.  Patient states that she feels like her speech is often slurred but she is uncertain of when it started.  States that she talked to her sister today and she was concerned about her speech.  She is uncertain of the last time that she was speaking normally and thinks that it was may be this past Tuesday.  Denies being on any anticoagulation.  EMS stated that she had a glucose that was reading high.  Soft blood pressures.     Physical Exam   Triage Vital Signs: ED Triage Vitals [10/16/23 1243]  Encounter Vitals Group     BP      Girls Systolic BP Percentile      Girls Diastolic BP Percentile      Boys Systolic BP Percentile      Boys Diastolic BP Percentile      Pulse      Resp      Temp      Temp src      SpO2      Weight      Height      Head Circumference      Peak Flow      Pain Score 0     Pain Loc      Pain Education      Exclude from Growth Chart     Most recent vital signs: Vitals:   10/16/23 1256 10/16/23 1400  BP:    Pulse:  66  Resp:  20  Temp: 98 F (36.7 C)   SpO2:  100%    Physical Exam Constitutional:      Appearance: She is well-developed.  HENT:     Head: Atraumatic.     Mouth/Throat:     Mouth: Mucous membranes are dry.  Eyes:     Extraocular Movements: Extraocular  movements intact.     Conjunctiva/sclera: Conjunctivae normal.     Pupils: Pupils are equal, round, and reactive to light.  Cardiovascular:     Rate and Rhythm: Regular rhythm.  Pulmonary:     Effort: No respiratory distress.     Breath sounds: No wheezing.  Abdominal:     General: There is no distension.     Tenderness: There is no abdominal tenderness.  Musculoskeletal:        General: Normal range of motion.     Cervical back: Normal range of motion.  Skin:    General: Skin is warm.  Neurological:     Mental Status: She is alert.     Cranial Nerves: Cranial nerves 2-12 are intact.     Sensory: Sensation is intact.     Comments: Slurred speech. 4/5 strength bilateral lower extremities.  Sensation intact bilateral upper and lower extremities and is equal.  IMPRESSION / MDM / ASSESSMENT AND PLAN / ED COURSE  I reviewed the triage vital signs and the nursing notes.  On arrival patient with a glucose reading high, slurred speech and being found down.  Unwitnessed fall patient does not recall the events of the fall.  Patient is outside of the window for TNK.  Patient with slurred speech and concern for possible CVA however unknown last known well.  I called to discuss with her sister over the phone and she states that she talked to her today and that her speech sounded slurred but she was uncertain if anyone talked to her yesterday or the last time her speech was normal.  States that she has had significant amount of stress and is currently trying to get into [facility]  Differential diagnosis including hyperglycemia, DKA, HHS, intracranial hemorrhage, CVA, dehydration, infectious process, rhabdomyolysis, ACS   EKG  I, Clotilda Punter, the attending physician, personally viewed and interpreted this ECG.  EKG with normal sinus rhythm.  Underlying artifact.  PR interval reading is short but I think that that is artifact and appears to be within normal limits on my evaluation.   Narrow complex.  QTc 467.  Worsening ST depression when compared to prior EKG.  No significant ST elevation.  No tachycardic or bradycardic dysrhythmias while on cardiac telemetry.  RADIOLOGY I independently reviewed imaging, my interpretation of imaging: CT scan of the head -no signs of intracranial hemorrhage.  Read as no acute findings.  CT scan cervical spine -no signs of fracture  Chest x-ray -read as questionable pneumonia and recommended 2 view.  2 view ordered  LABS (all labs ordered are listed, but only abnormal results are displayed) Labs interpreted as -    Labs Reviewed  BLOOD GAS, VENOUS - Abnormal; Notable for the following components:      Result Value   pH, Ven 7.04 (*)    pCO2, Ven 26 (*)    Bicarbonate 7.0 (*)    Acid-base deficit 22.3 (*)    All other components within normal limits  LACTIC ACID, PLASMA - Abnormal; Notable for the following components:   Lactic Acid, Venous 4.1 (*)    All other components within normal limits  COMPREHENSIVE METABOLIC PANEL WITH GFR - Abnormal; Notable for the following components:   Sodium 125 (*)    Potassium 6.7 (*)    Chloride 88 (*)    CO2 8 (*)    Glucose, Bld 1,171 (*)    BUN 42 (*)    Creatinine, Ser 2.28 (*)    Calcium  8.7 (*)    AST 44 (*)    GFR, Estimated 22 (*)    Anion gap 29 (*)    All other components within normal limits  CBC WITH DIFFERENTIAL/PLATELET - Abnormal; Notable for the following components:   WBC 12.7 (*)    RBC 3.36 (*)    Hemoglobin 10.6 (*)    MCV 111.9 (*)    MCHC 28.2 (*)    Neutro Abs 10.5 (*)    Monocytes Absolute 1.2 (*)    Abs Immature Granulocytes 0.11 (*)    All other components within normal limits  CK - Abnormal; Notable for the following components:   Total CK 330 (*)    All other components within normal limits  BETA-HYDROXYBUTYRIC ACID - Abnormal; Notable for the following components:   Beta-Hydroxybutyric Acid >8.00 (*)    All other components within normal limits   CBG MONITORING, ED - Abnormal; Notable  for the following components:   Glucose-Capillary >600 (*)    All other components within normal limits  TROPONIN I (HIGH SENSITIVITY) - Abnormal; Notable for the following components:   Troponin I (High Sensitivity) 98 (*)    All other components within normal limits  CULTURE, BLOOD (SINGLE)  CULTURE, BLOOD (SINGLE)  LACTIC ACID, PLASMA  URINALYSIS, W/ REFLEX TO CULTURE (INFECTION SUSPECTED)  PROTIME-INR  BETA-HYDROXYBUTYRIC ACID  BETA-HYDROXYBUTYRIC ACID  BETA-HYDROXYBUTYRIC ACID  BETA-HYDROXYBUTYRIC ACID  BASIC METABOLIC PANEL WITH GFR  BASIC METABOLIC PANEL WITH GFR  BASIC METABOLIC PANEL WITH GFR  TROPONIN I (HIGH SENSITIVITY)     MDM    Blood culture obtained.  Glucose reading high.  Will start on 1 L normal saline.    Patient received a total of 30 cc/kg of IV fluids.  Glucose significantly elevated in the 1000's, potassium significantly elevated at 6.7.  pH is 7, anion gap of 29.  Acute kidney injury with creatinine elevated up to 2.2 from a baseline of 0.9 with an elevated BUN of 42.  CO2 was significantly low at 8 -  lactic acidosis of 4.1.  Difficult IV stick requiring ultrasound-guided IV.  Started on antibiotics to cover for possible sepsis given questionable pneumonia on chest x-ray.  Started on insulin  to treat DKA.  Discussed with sister who states that she gets slurred speech whenever her glucose is off and have lower suspicion for CVA.  Does have a mildly elevated troponin, no chest pain at this time.  Consulted hospitalist for admission for DKA   PROCEDURES:  Critical Care performed: yes  .Critical Care  Performed by: Suzanne Kirsch, MD Authorized by: Suzanne Kirsch, MD   Critical care provider statement:    Critical care time (minutes):  45   Critical care time was exclusive of:  Separately billable procedures and treating other patients   Critical care was necessary to treat or prevent imminent or  life-threatening deterioration of the following conditions:  Endocrine crisis   Critical care was time spent personally by me on the following activities:  Development of treatment plan with patient or surrogate, discussions with consultants, evaluation of patient's response to treatment, examination of patient, ordering and review of laboratory studies, ordering and review of radiographic studies, ordering and performing treatments and interventions, pulse oximetry, re-evaluation of patient's condition and review of old charts   Care discussed with: admitting provider   .Ultrasound ED Peripheral IV (Provider)  Date/Time: 10/16/2023 2:23 PM  Performed by: Suzanne Kirsch, MD Authorized by: Suzanne Kirsch, MD   Procedure details:    Indications: multiple failed IV attempts     Skin Prep: chlorhexidine  gluconate     Location:  Left AC   Angiocath:  20 G   Bedside Ultrasound Guided: Yes     Images: not archived     Patient tolerated procedure without complications: Yes     Dressing applied: Yes     Patient's presentation is most consistent with acute presentation with potential threat to life or bodily function.   MEDICATIONS ORDERED IN ED: Medications  insulin  regular, human (MYXREDLIN ) 100 units/ 100 mL infusion (has no administration in time range)  lactated ringers  infusion (has no administration in time range)  dextrose  5 % in lactated ringers  infusion (0 mLs Intravenous Hold 10/16/23 1418)  dextrose  50 % solution 0-50 mL (has no administration in time range)  lactated ringers  infusion (has no administration in time range)  ceFEPIme  (MAXIPIME ) 2 g in sodium chloride  0.9 % 100 mL  IVPB (2 g Intravenous New Bag/Given 10/16/23 1416)  vancomycin  (VANCOREADY) IVPB 1750 mg/350 mL (has no administration in time range)  lactated ringers  bolus 1,000 mL (has no administration in time range)  sodium chloride  0.9 % bolus 1,000 mL (0 mLs Intravenous Stopped 10/16/23 1433)  lactated ringers  bolus 1,000  mL (0 mLs Intravenous Paused 10/16/23 1413)    FINAL CLINICAL IMPRESSION(S) / ED DIAGNOSES   Final diagnoses:  Fall, initial encounter  Hyperglycemia  Diabetic ketoacidosis without coma associated with type 1 diabetes mellitus (HCC)     Rx / DC Orders   ED Discharge Orders     None        Note:  This document was prepared using Dragon voice recognition software and may include unintentional dictation errors.   Suzanne Kirsch, MD 10/16/23 1438

## 2023-10-16 NOTE — ED Notes (Addendum)
 This RN attempted to start IV without success. Dr Suzanne informed, US  to be set up at bedside.

## 2023-10-16 NOTE — ED Notes (Signed)
 Pt taken to CT.

## 2023-10-16 NOTE — ED Notes (Signed)
 Pt requesting water at this time. Writer consulted with RN Darlyn. Rn agreed, Pt given water.

## 2023-10-16 NOTE — Sepsis Progress Note (Signed)
 Sepsis protocol is being followed by [facility].

## 2023-10-16 NOTE — ED Triage Notes (Signed)
 Pt stating last time she felt she was talking normal was about 2 weeks ago.

## 2023-10-16 NOTE — ED Triage Notes (Signed)
 Pt BIB [facility] from [facility], unwit fall in room, found by housekeeping - bed to wheelchair, doesn't remember much. Denies pain anywhere. Stroke screen negative with EMS- ataxia affecting left side, pt stating speech more slurred than normal. Pt awaiting placement at  house - no fam in area, sister in [place]. Sugar read High with EMS, hx of low sugar. Pt stating hasn't felt right since Tuesday, denies any UTI symptoms  aox4 on arrival- 120/40, etco2 13

## 2023-10-16 NOTE — ED Notes (Signed)
 Pt stating having some difficulty breathing that started this morning.

## 2023-10-16 NOTE — ED Notes (Signed)
 Pt returned from CT

## 2023-10-16 NOTE — ED Notes (Signed)
 Pt taken to ct

## 2023-10-16 NOTE — Progress Notes (Signed)
 2130 - Upon reviewing orders, pt noted to have 2 orders for LR, both of which are documented as running. LR @ 150 running upon assuming care of patient. Message sent to provider requesting clarification. Provider also notified that, CBG continue to read HIGH, lab has been having difficulty obtaining blood samples, and troponins continue to trend up. EKG obtained. Pt denies c/o chest pain/discomfort. Awaiting response.   2158 - Order received to D/C LR @ 150 and continue LR @ 125 - see EMAR for updates. No additional orders received at this time.   9485 - Pt resting comfortably throughout the shift. Remains A&Ox4. Vital signs stable. CBG have improved (see results), continuing on Insulin  drip per Endotool. D5LR started per protocol.

## 2023-10-17 ENCOUNTER — Inpatient Hospital Stay (HOSPITAL_COMMUNITY)
Admit: 2023-10-17 | Discharge: 2023-10-17 | Disposition: A | Discharge status: Home / Self Care | Attending: Internal Medicine

## 2023-10-17 DIAGNOSIS — E1010 Type 1 diabetes mellitus with ketoacidosis without coma: Secondary | ICD-10-CM | POA: Diagnosis not present

## 2023-10-17 DIAGNOSIS — R008 Other abnormalities of heart beat: Secondary | ICD-10-CM | POA: Diagnosis not present

## 2023-10-17 LAB — RESPIRATORY PANEL BY PCR

## 2023-10-17 LAB — GLUCOSE, CAPILLARY
Glucose-Capillary: 447 mg/dL — ABNORMAL HIGH (ref 70–99)
Glucose-Capillary: 313 mg/dL — ABNORMAL HIGH (ref 70–99)
Glucose-Capillary: 399 mg/dL — ABNORMAL HIGH (ref 70–99)
Glucose-Capillary: 252 mg/dL — ABNORMAL HIGH (ref 70–99)
Glucose-Capillary: 166 mg/dL — ABNORMAL HIGH (ref 70–99)
Glucose-Capillary: 149 mg/dL — ABNORMAL HIGH (ref 70–99)
Glucose-Capillary: 165 mg/dL — ABNORMAL HIGH (ref 70–99)
Glucose-Capillary: 125 mg/dL — ABNORMAL HIGH (ref 70–99)
Glucose-Capillary: 170 mg/dL — ABNORMAL HIGH (ref 70–99)
Glucose-Capillary: 147 mg/dL — ABNORMAL HIGH (ref 70–99)
Glucose-Capillary: 213 mg/dL — ABNORMAL HIGH (ref 70–99)
Glucose-Capillary: 152 mg/dL — ABNORMAL HIGH (ref 70–99)
Glucose-Capillary: 209 mg/dL — ABNORMAL HIGH (ref 70–99)

## 2023-10-17 LAB — FOLATE: Folate: 12.5 ng/mL (ref >5.9)

## 2023-10-17 LAB — BASIC METABOLIC PANEL WITH GFR
Anion gap: 13 (ref 5–15)
Anion gap: 8 (ref 5–15)
Anion gap: 9 (ref 5–15)
Anion gap: 8 (ref 5–15)
BUN: 44 mg/dL — ABNORMAL HIGH (ref 8–23)
BUN: 41 mg/dL — ABNORMAL HIGH (ref 8–23)
BUN: 39 mg/dL — ABNORMAL HIGH (ref 8–23)
BUN: 39 mg/dL — ABNORMAL HIGH (ref 8–23)
CO2: 21 mmol/L — ABNORMAL LOW (ref 22–32)
CO2: 24 mmol/L (ref 22–32)
CO2: 24 mmol/L (ref 22–32)
CO2: 23 mmol/L (ref 22–32)
Calcium: 8.4 mg/dL — ABNORMAL LOW (ref 8.9–10.3)
Calcium: 8.5 mg/dL — ABNORMAL LOW (ref 8.9–10.3)
Calcium: 8.6 mg/dL — ABNORMAL LOW (ref 8.9–10.3)
Calcium: 8.5 mg/dL — ABNORMAL LOW (ref 8.9–10.3)
Chloride: 104 mmol/L (ref 98–111)
Chloride: 108 mmol/L (ref 98–111)
Chloride: 106 mmol/L (ref 98–111)
Chloride: 107 mmol/L (ref 98–111)
Creatinine, Ser: 1.6 mg/dL — ABNORMAL HIGH (ref 0.44–1.00)
Creatinine, Ser: 1.48 mg/dL — ABNORMAL HIGH (ref 0.44–1.00)
Creatinine, Ser: 1.39 mg/dL — ABNORMAL HIGH (ref 0.44–1.00)
Creatinine, Ser: 1.37 mg/dL — ABNORMAL HIGH (ref 0.44–1.00)
GFR, Estimated: 34 mL/min — ABNORMAL LOW (ref >60)
GFR, Estimated: 38 mL/min — ABNORMAL LOW (ref >60)
GFR, Estimated: 41 mL/min — ABNORMAL LOW
GFR, Estimated: 41 mL/min — ABNORMAL LOW (ref >60)
Glucose, Bld: 386 mg/dL — ABNORMAL HIGH (ref 70–99)
Glucose, Bld: 143 mg/dL — ABNORMAL HIGH (ref 70–99)
Glucose, Bld: 186 mg/dL — ABNORMAL HIGH (ref 70–99)
Glucose, Bld: 217 mg/dL — ABNORMAL HIGH (ref 70–99)
Potassium: 4.3 mmol/L (ref 3.5–5.1)
Potassium: 4.5 mmol/L (ref 3.5–5.1)
Potassium: 4.5 mmol/L (ref 3.5–5.1)
Potassium: 4.5 mmol/L (ref 3.5–5.1)
Sodium: 135 mmol/L (ref 135–145)
Sodium: 137 mmol/L (ref 135–145)
Sodium: 139 mmol/L (ref 135–145)
Sodium: 138 mmol/L (ref 135–145)

## 2023-10-17 LAB — COMPREHENSIVE METABOLIC PANEL WITH GFR
ALT: 36 U/L (ref 0–44)
AST: 50 U/L — ABNORMAL HIGH (ref 15–41)
Albumin: 3.1 g/dL — ABNORMAL LOW (ref 3.5–5.0)
Alkaline Phosphatase: 64 U/L (ref 38–126)
Anion gap: 8 (ref 5–15)
BUN: 44 mg/dL — ABNORMAL HIGH (ref 8–23)
CO2: 21 mmol/L — ABNORMAL LOW (ref 22–32)
Calcium: 8.2 mg/dL — ABNORMAL LOW (ref 8.9–10.3)
Chloride: 105 mmol/L (ref 98–111)
Creatinine, Ser: 1.61 mg/dL — ABNORMAL HIGH (ref 0.44–1.00)
GFR, Estimated: 34 mL/min — ABNORMAL LOW (ref >60)
Glucose, Bld: 389 mg/dL — ABNORMAL HIGH (ref 70–99)
Potassium: 4.1 mmol/L (ref 3.5–5.1)
Sodium: 134 mmol/L — ABNORMAL LOW (ref 135–145)
Total Bilirubin: 0.5 mg/dL (ref 0.0–1.2)
Total Protein: 5.3 g/dL — ABNORMAL LOW (ref 6.5–8.1)

## 2023-10-17 LAB — ECHOCARDIOGRAM COMPLETE
AR max vel: 2.67 cm2
AV Area VTI: 3.14 cm2
AV Area mean vel: 2.78 cm2
AV Mean grad: 6.5 mmHg
AV Peak grad: 10.4 mmHg
Ao pk vel: 1.61 m/s
Area-P 1/2: 3.39 cm2
Height: 70 in
MV VTI: 2.73 cm2
S' Lateral: 2.9 cm
Weight: 2349.22 [oz_av]

## 2023-10-17 LAB — BLOOD GAS, VENOUS
Acid-base deficit: 1.6 mmol/L (ref 0.0–2.0)
Bicarbonate: 21.7 mmol/L (ref 20.0–28.0)
O2 Saturation: 99.2 %
Patient temperature: 37
pCO2, Ven: 32 mmHg — ABNORMAL LOW (ref 44–60)
pH, Ven: 7.44 — ABNORMAL HIGH (ref 7.25–7.43)
pO2, Ven: 99 mmHg — ABNORMAL HIGH (ref 32–45)

## 2023-10-17 LAB — CBC
HCT: 28.4 % — ABNORMAL LOW (ref 36.0–46.0)
Hemoglobin: 9.6 g/dL — ABNORMAL LOW (ref 12.0–15.0)
MCH: 31.3 pg (ref 26.0–34.0)
MCHC: 33.8 g/dL (ref 30.0–36.0)
MCV: 92.5 fL (ref 80.0–100.0)
Platelets: 169 K/uL (ref 150–400)
RBC: 3.07 MIL/uL — ABNORMAL LOW (ref 3.87–5.11)
RDW: 14.2 % (ref 11.5–15.5)
WBC: 12.9 K/uL — ABNORMAL HIGH (ref 4.0–10.5)
nRBC: 0 % (ref 0.0–0.2)

## 2023-10-17 LAB — SEDIMENTATION RATE: Sed Rate: 14 mm/h (ref 0–30)

## 2023-10-17 LAB — VITAMIN B12: Vitamin B-12: 3163 pg/mL — ABNORMAL HIGH (ref 180–914)

## 2023-10-17 LAB — VITAMIN D 25 HYDROXY (VIT D DEFICIENCY, FRACTURES): Vit D, 25-Hydroxy: 41.64 ng/mL (ref 30–100)

## 2023-10-17 LAB — LACTIC ACID, PLASMA: Lactic Acid, Venous: 1.8 mmol/L (ref 0.5–1.9)

## 2023-10-17 LAB — BETA-HYDROXYBUTYRIC ACID: Beta-Hydroxybutyric Acid: 0.49 mmol/L — ABNORMAL HIGH (ref 0.05–0.27)

## 2023-10-17 LAB — IRON AND TIBC
Iron: 16 ug/dL — ABNORMAL LOW (ref 28–170)
Saturation Ratios: 8 % — ABNORMAL LOW (ref 10.4–31.8)
TIBC: 207 ug/dL — ABNORMAL LOW (ref 250–450)
UIBC: 191 ug/dL

## 2023-10-17 LAB — STREP PNEUMONIAE URINARY ANTIGEN: Strep Pneumo Urinary Antigen: NEGATIVE

## 2023-10-17 LAB — C-REACTIVE PROTEIN: CRP: 1.8 mg/dL — ABNORMAL HIGH (ref <1.0)

## 2023-10-17 LAB — MAGNESIUM: Magnesium: 2.3 mg/dL (ref 1.7–2.4)

## 2023-10-17 LAB — PHOSPHORUS: Phosphorus: 2.3 mg/dL — ABNORMAL LOW (ref 2.5–4.6)

## 2023-10-17 LAB — D-DIMER, QUANTITATIVE: D-Dimer, Quant: 0.54 ug{FEU}/mL — ABNORMAL HIGH (ref 0.00–0.50)

## 2023-10-17 MED ORDER — ENSURE PLUS HIGH PROTEIN PO LIQD: 237.0000 mL | Freq: Two times a day (BID) | ORAL | Status: DC

## 2023-10-17 MED ORDER — ADULT MULTIVITAMIN W/MINERALS CH
1.0000 | ORAL_TABLET | Freq: Every day | ORAL | Status: DC
  Administered 2023-10-18 – 2023-10-21 (×4): 1 via ORAL
  Filled 2023-10-17 (×4): qty 1

## 2023-10-17 MED ORDER — AZITHROMYCIN 250 MG PO TABS: 500.0000 mg | ORAL_TABLET | Freq: Every day | ORAL | Status: DC

## 2023-10-17 MED ORDER — GLUCERNA SHAKE PO LIQD
237.0000 mL | Freq: Three times a day (TID) | ORAL | Status: DC
  Administered 2023-10-17 – 2023-10-20 (×6): 237 mL via ORAL

## 2023-10-17 MED ORDER — THIAMINE HCL 100 MG PO TABS
100.0000 mg | ORAL_TABLET | Freq: Every day | ORAL | Status: AC
  Administered 2023-10-18 – 2023-10-24 (×8): 100 mg via ORAL
  Filled 2023-10-17 (×13): qty 1

## 2023-10-17 MED ORDER — INSULIN GLARGINE-YFGN 100 UNIT/ML ~~LOC~~ SOLN
15.0000 [IU] | SUBCUTANEOUS | Status: DC
  Administered 2023-10-17 – 2023-10-18 (×2): 15 [IU] via SUBCUTANEOUS
  Filled 2023-10-17 (×2): qty 0.15

## 2023-10-17 MED ORDER — INSULIN ASPART 100 UNIT/ML IJ SOLN
3.0000 [IU] | Freq: Three times a day (TID) | INTRAMUSCULAR | Status: DC
  Administered 2023-10-17 (×2): 3 [IU] via SUBCUTANEOUS
  Filled 2023-10-17 (×3): qty 1

## 2023-10-17 MED ORDER — K PHOS MONO-SOD PHOS DI & MONO 155-852-130 MG PO TABS
500.0000 mg | ORAL_TABLET | Freq: Four times a day (QID) | ORAL | Status: AC
  Administered 2023-10-17 (×2): 500 mg via ORAL
  Filled 2023-10-17 (×2): qty 2

## 2023-10-17 MED ORDER — INSULIN ASPART 100 UNIT/ML IJ SOLN
0.0000 [IU] | Freq: Every day | INTRAMUSCULAR | Status: DC
  Administered 2023-10-17 – 2023-10-18 (×2): 2 [IU] via SUBCUTANEOUS
  Administered 2023-10-20: 3 [IU] via SUBCUTANEOUS
  Administered 2023-10-24 (×2): 2 [IU] via SUBCUTANEOUS
  Administered 2023-10-25 (×2): 5 [IU] via SUBCUTANEOUS
  Filled 2023-10-17 (×5): qty 1

## 2023-10-17 MED ORDER — VITAMIN C 500 MG PO TABS
250.0000 mg | ORAL_TABLET | Freq: Two times a day (BID) | ORAL | Status: DC
  Administered 2023-10-17 – 2023-10-26 (×23): 250 mg via ORAL
  Filled 2023-10-17 (×18): qty 1

## 2023-10-17 MED ORDER — INSULIN GLARGINE-YFGN 100 UNIT/ML ~~LOC~~ SOLN
30.0000 [IU] | SUBCUTANEOUS | Status: DC
  Filled 2023-10-17: qty 0.3

## 2023-10-17 MED ORDER — ENOXAPARIN SODIUM 40 MG/0.4ML IJ SOSY
40.0000 mg | PREFILLED_SYRINGE | Freq: Every evening | INTRAMUSCULAR | Status: DC
  Administered 2023-10-17 – 2023-10-25 (×11): 40 mg via SUBCUTANEOUS
  Filled 2023-10-17 (×9): qty 0.4

## 2023-10-17 MED ORDER — INSULIN ASPART 100 UNIT/ML IJ SOLN
0.0000 [IU] | Freq: Three times a day (TID) | INTRAMUSCULAR | Status: DC
  Administered 2023-10-17: 3 [IU] via SUBCUTANEOUS
  Administered 2023-10-17: 2 [IU] via SUBCUTANEOUS
  Administered 2023-10-18 (×2): 5 [IU] via SUBCUTANEOUS
  Administered 2023-10-18 – 2023-10-19 (×2): 7 [IU] via SUBCUTANEOUS
  Administered 2023-10-19: 3 [IU] via SUBCUTANEOUS
  Administered 2023-10-19: 5 [IU] via SUBCUTANEOUS
  Administered 2023-10-20: 1 [IU] via SUBCUTANEOUS
  Administered 2023-10-20: 7 [IU] via SUBCUTANEOUS
  Administered 2023-10-21: 1 [IU] via SUBCUTANEOUS
  Administered 2023-10-21: 5 [IU] via SUBCUTANEOUS
  Administered 2023-10-22: 2 [IU] via SUBCUTANEOUS
  Administered 2023-10-22: 3 [IU] via SUBCUTANEOUS
  Administered 2023-10-22: 1 [IU] via SUBCUTANEOUS
  Administered 2023-10-23: 3 [IU] via SUBCUTANEOUS
  Administered 2023-10-23: 7 [IU] via SUBCUTANEOUS
  Administered 2023-10-24: 2 [IU] via SUBCUTANEOUS
  Administered 2023-10-24: 7 [IU] via SUBCUTANEOUS
  Administered 2023-10-24: 2 [IU] via SUBCUTANEOUS
  Administered 2023-10-24: 7 [IU] via SUBCUTANEOUS
  Administered 2023-10-24 – 2023-10-25 (×4): 3 [IU] via SUBCUTANEOUS
  Administered 2023-10-26 (×2): 2 [IU] via SUBCUTANEOUS
  Filled 2023-10-17 (×20): qty 1

## 2023-10-17 MED ORDER — IRON SUCROSE 300 MG IVPB - SIMPLE MED
300.0000 mg | Freq: Once | Status: AC
  Administered 2023-10-17: 300 mg via INTRAVENOUS
  Filled 2023-10-17: qty 300

## 2023-10-17 MED ORDER — LACTATED RINGERS IV SOLN: INTRAVENOUS | Status: AC

## 2023-10-17 NOTE — Progress Notes (Signed)
 [facility] Progress Note  Patient: Joyce Brown    FMW:[id number]  DOA: 10/16/2023     Date of Service: the patient was seen and examined on 10/17/2023  Chief Complaint  Patient presents with   Fall   Hyperglycemia   Brief hospital course: Joyce Brown is a 73 y.o. female with medical history significant of DM type 1, Asthma, Cerebellar Ataxia wheel chair bound, fibromyalgia, GERD, HLD, HTN  OSA on CPAP,  remote s/p tx PE, Stiff person syndrome who presents to ED s/p being found down in hotel after sister called EMS for well as patient did not sound her self on phone call. Patient states she was attempting to transfer herself from her bed to chair and fell.  Patient notes prior to this she was at her baseline health although on ros she has noted today she has had increase in sob. She notes he sugar was within range the last time she checked it.  Patient has insulin  pump.  She notes no hx of CVA and no new weakness and noted her speech at times is slurred and her sister corroborates this. Patient on further ros noted no cough/fever/chills /n/v/d/ dysuria or abdominal pain. She notes she is at her baseline strength she does not note new paresthesias. She states current she feels much improved from this am.     ED Course:  Vitals afeb , bp 103/54, hr 70, rr 22, sat 99%  Glucose >600 Lactic 4.1 ,3.9 Beta -hydroxy>8 Na 125, K 6.7, cl 88, bicarb 8, glu 1171, cr 2.28 ( 0.93)  Wbc 12.7, hgb 10.6,  pmn 10.5 Vbg: 7/26 NS  1L EKG: snr  incomplete RBBB CE 98, 114 Latic 3.9 CXR: 1. Question developing right lower lung zone airspace opacity. Recommend further evaluation with chest x-ray PA and lateral view. 2.  Aortic Atherosclerosis (ICD10-I70.0).     Head CT: 1. No evidence of an acute intracranial abnormality. 2. Mild parenchymal atrophy and chronic small vessel ischemic disease.   CT cervical spine:1. No evidence of an acute cervical spine fracture. 2. Mild grade 1  anterolisthesis at C3-C4, C4-C5 and C7-T1. 3. Cervical spondylosis as described.  Tx insulin  drip , cefepime ,vanc ,     Assessment and Plan:  # Type1 DM with DKA S/p insulin  IV infusion as per DKA protocol. Anion gap closed, acidosis resolved Transition to subcu insulin  Started Semglee  15 units nightly, NovoLog  3 units 3 times daily and sliding scale Monitor CBG, continue carb modified diet Continue IV fluid for another 24 hours.  # Suspected pneumonia, ruled out CT chest negative for pneumonia RVP negative Leukocytosis and tachypnea could be secondary to DKA S/p azithromycin  and ceftriaxone  DC antibiotics and monitor    # Elevated troponin, most likely due to demand ischemia Patient denied any chest pain TTE LVEF 65 to 70%, no WMA, no significant valvular abnormality.  Negative PFO   # AKI, due to dehydration secondary to DKA Creatinine improving after IV fluid sCr 2.08>> 1.37 Monitor renal functions daily  # Hypophosphatemia due to nutritional deficiency. Phos repleted. Monitor electrolytes and replete as needed.    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  Follow-up with PCP to repeat iron  profile after 3 to 6 months.   Body mass index is 21.07 kg/m.  Interventions:  Diet: Carb modified diet DVT Prophylaxis: Subcutaneous Lovenox    Advance goals of care discussion: Full code  Family Communication:  family was not present at bedside, at the time of interview.  The pt provided permission to discuss medical plan with the family. Opportunity was given to ask question and all questions were answered satisfactorily.   Disposition:  Pt is from home, lives in [place], admitted with DKA and PNA, still has elevated BG and on IV Abx, which precludes a safe discharge. Discharge to SNF, when stable, may need 1-2 more days to improve.  Subjective: No significant events overnight.  Patient is feeling much better today.  Had mild cough with small amount of phlegm.  No fever or chills.  Denied any pain.  She was able to transfer herself from bed to the bedside recliner.  Physical Exam: General: NAD, lying comfortably Appear in no distress, affect appropriate Eyes: PERRLA ENT: Oral Mucosa Clear, moist  Neck: no JVD,  Cardiovascular: S1 and S2 Present, no Murmur,  Respiratory: good respiratory effort, Bilateral Air entry equal and Decreased, no Crackles, no wheezes Abdomen: Bowel Sound present, Soft and no tenderness,  Skin: no rashes Extremities: no Pedal edema, no calf tenderness Neurologic: without any new focal findings, chronic cerebellar ataxia and dysarthria. Gait not checked due to patient safety concerns  Vitals:   10/17/23 1100 10/17/23 1200 10/17/23 1300 10/17/23 1400  BP: (!) 117/43 (!) 120/48 (!) 120/50 116/65  Pulse: 77 69 70 73  Resp: (!) 23 16 17 18   Temp:    98.3 F (36.8 C)  TempSrc:    Oral  SpO2: 100% 100% 100% 100%  Weight:      Height:        Intake/Output Summary (Last 24 hours) at 10/17/2023 1510 Last data filed at 10/17/2023 1500 Gross per 24 hour  Intake 3563.75 ml  Output 1365 ml  Net 2198.75 ml   Filed Weights   10/16/23 1243 10/17/23 0400  Weight: 78.9 kg 66.6 kg    Data Reviewed: I have personally reviewed and interpreted daily labs, tele strips, imagings as discussed above. I reviewed all nursing notes, pharmacy notes, vitals, pertinent old records I  have discussed plan of care as described above with RN and patient/family.  CBC: Recent Labs  Lab 10/16/23 1245 10/17/23 0134  WBC 12.7* 12.9*  NEUTROABS 10.5*  --   HGB 10.6* 9.6*  HCT 37.6 28.4*  MCV 111.9* 92.5  PLT 204 169   Basic Metabolic Panel: Recent Labs  Lab 10/17/23 0132 10/17/23 0134 10/17/23 0828 10/17/23 1045 10/17/23 1305  NA 135 134* 137 139 138  K 4.3 4.1 4.5 4.5 4.5  CL 104 105 108 106 107  CO2 21* 21* 24 24 23   GLUCOSE 386* 389* 143* 186* 217*  BUN 44* 44* 41* 39* 39*  CREATININE 1.60* 1.61* 1.48* 1.39* 1.37*  CALCIUM  8.4* 8.2* 8.5* 8.6* 8.5*  MG  --   --   --  2.3  --   PHOS  --   --   --  2.3*  --     Studies: ECHOCARDIOGRAM COMPLETE Result Date: 10/17/2023    ECHOCARDIOGRAM REPORT   Patient Name:   Joyce Brown Date of Exam: 10/17/2023 Medical Rec #:  [id number]           Height:       70.0 in Accession #:    [id number]          Weight:       146.8 lb Date of Birth:  [birth year]           BSA:          1.830 m Patient Age:    73 years            BP:           102/44 mmHg Patient Gender: F                   HR:           72 bpm. Exam Location:  [facility] Procedure: 2D Echo, Cardiac Doppler, Color Doppler, Strain Analysis and 3D Echo            (Both Spectral and Color Flow Doppler were utilized during            procedure). Indications:     Other abnormalities of the heart R00.8  History:         Patient has no prior history of Echocardiogram examinations.                  Risk Factors:Diabetes, Hypertension and Sleep Apnea. Pulmonary                  embolism.  Sonographer:     Christopher Furnace Referring Phys:  [id number] SARA-MAIZ A THOMAS Diagnosing Phys: Deatrice Cage MD  Sonographer Comments: Global longitudinal strain was attempted. IMPRESSIONS  1. Left ventricular ejection fraction, by estimation, is 65 to 70%. The left ventricle has normal function. The left ventricle has no regional wall motion abnormalities. Left ventricular diastolic parameters were normal.   2. Right ventricular systolic function is normal. The right ventricular size is normal. Tricuspid regurgitation signal is inadequate for assessing PA pressure.  3. The mitral valve is normal in structure. No evidence of mitral valve regurgitation. No evidence of mitral stenosis.  4. The aortic valve is normal in structure. Aortic valve regurgitation is not visualized. Aortic valve sclerosis/calcification is present, without any evidence of aortic stenosis.  5. The inferior vena cava is normal in size with greater than 50% respiratory variability, suggesting right atrial pressure of 3 mmHg. FINDINGS  Left Ventricle: Left ventricular ejection fraction, by estimation, is 65 to 70%. The left ventricle has normal function. The left ventricle has no regional wall motion abnormalities. Global longitudinal strain performed but not reported based on interpreter judgement due to suboptimal tracking. The left ventricular internal cavity size was normal in size. There is no left ventricular hypertrophy. Left ventricular diastolic parameters were normal. Right Ventricle: The right ventricular size is normal. No increase in right ventricular wall thickness. Right ventricular systolic function is normal. Tricuspid regurgitation signal is inadequate for assessing PA pressure. The tricuspid regurgitant velocity is 2.44 m/s, and with an assumed right atrial pressure of 3 mmHg, the estimated right ventricular systolic pressure is 26.8 mmHg. Left Atrium: Left atrial size was normal in size. Right Atrium: Right atrial size was normal in size. Pericardium: There is no evidence of pericardial effusion. Mitral Valve: The mitral valve is normal  in structure. No evidence of mitral valve regurgitation. No evidence of mitral valve stenosis. MV peak gradient, 7.2 mmHg. The mean mitral valve gradient is 2.0 mmHg. Tricuspid Valve: The tricuspid valve is normal in structure. Tricuspid valve regurgitation is trivial. No evidence of tricuspid  stenosis. Aortic Valve: The aortic valve is normal in structure. Aortic valve regurgitation is not visualized. Aortic valve sclerosis/calcification is present, without any evidence of aortic stenosis. Aortic valve mean gradient measures 6.5 mmHg. Aortic valve peak  gradient measures 10.4 mmHg. Aortic valve area, by VTI measures 3.14 cm. Pulmonic Valve: The pulmonic valve was normal in structure. Pulmonic valve regurgitation is not visualized. No evidence of pulmonic stenosis. Aorta: The aortic root is normal in size and structure. Venous: The inferior vena cava is normal in size with greater than 50% respiratory variability, suggesting right atrial pressure of 3 mmHg. IAS/Shunts: No atrial level shunt detected by color flow Doppler. Additional Comments: 3D was performed not requiring image post processing on an independent workstation and was normal.  LEFT VENTRICLE PLAX 2D LVIDd:         4.20 cm   Diastology LVIDs:         2.90 cm   LV e' medial:    9.46 cm/s LV PW:         1.10 cm   LV E/e' medial:  11.3 LV IVS:        1.00 cm   LV e' lateral:   9.46 cm/s LVOT diam:     2.00 cm   LV E/e' lateral: 11.3 LV SV:         99 LV SV Index:   54 LVOT Area:     3.14 cm                           3D Volume EF:                          3D EF:        67 % RIGHT VENTRICLE RV Basal diam:  3.70 cm RV Mid diam:    2.50 cm LEFT ATRIUM             Index        RIGHT ATRIUM           Index LA diam:        2.60 cm 1.42 cm/m   RA Area:     12.30 cm LA Vol (A2C):   28.9 ml 15.79 ml/m  RA Volume:   22.90 ml  12.51 ml/m LA Vol (A4C):   24.0 ml 13.11 ml/m LA Biplane Vol: 28.2 ml 15.41 ml/m  AORTIC VALVE AV Area (Vmax):    2.67 cm AV Area (Vmean):   2.78 cm AV Area (VTI):     3.14 cm AV Vmax:           161.00 cm/s AV Vmean:          116.500 cm/s AV VTI:            0.314 m AV Peak Grad:      10.4 mmHg AV Mean Grad:      6.5 mmHg LVOT Vmax:         137.00 cm/s LVOT Vmean:        103.000 cm/s LVOT VTI:          0.314 m LVOT/AV VTI  ratio: 1.00  AORTA Ao  Root diam: 3.20 cm MITRAL VALVE                TRICUSPID VALVE MV Area (PHT): 3.39 cm     TR Peak grad:   23.8 mmHg MV Area VTI:   2.73 cm     TR Vmax:        244.00 cm/s MV Peak grad:  7.2 mmHg MV Mean grad:  2.0 mmHg     SHUNTS MV Vmax:       1.34 m/s     Systemic VTI:  0.31 m MV Vmean:      69.6 cm/s    Systemic Diam: 2.00 cm MV Decel Time: 224 msec MV E velocity: 107.00 cm/s MV A velocity: 78.60 cm/s MV E/A ratio:  1.36 Deatrice Cage MD Electronically signed by Deatrice Cage MD Signature Date/Time: 10/17/2023/11:14:35 AM    Final    US  Venous Img Lower Bilateral (DVT) Result Date: 10/17/2023 CLINICAL DATA:  73 year old female with lower extremity swelling. EXAM: BILATERAL LOWER EXTREMITY VENOUS DOPPLER ULTRASOUND TECHNIQUE: Gray-scale sonography with graded compression, as well as color Doppler and duplex ultrasound were performed to evaluate the lower extremity deep venous systems from the level of the common femoral vein and including the common femoral, femoral, profunda femoral, popliteal and calf veins including the posterior tibial, peroneal and gastrocnemius veins when visible. The superficial great saphenous vein was also interrogated. Spectral Doppler was utilized to evaluate flow at rest and with distal augmentation maneuvers in the common femoral, femoral and popliteal veins. COMPARISON:  None Available. FINDINGS: RIGHT LOWER EXTREMITY Common Femoral Vein: No evidence of thrombus. Normal compressibility, respiratory phasicity and response to augmentation. Saphenofemoral Junction: No evidence of thrombus. Normal compressibility and flow on color Doppler imaging. Profunda Femoral Vein: No evidence of thrombus. Normal compressibility and flow on color Doppler imaging. Femoral Vein: No evidence of thrombus. Normal compressibility, respiratory phasicity and response to augmentation. Popliteal Vein: No evidence of thrombus. Normal compressibility, respiratory phasicity and response  to augmentation. Calf Veins: No evidence of thrombus. Normal compressibility and flow on color Doppler imaging. Other Findings:  None. LEFT LOWER EXTREMITY Common Femoral Vein: No evidence of thrombus. Normal compressibility, respiratory phasicity and response to augmentation. Saphenofemoral Junction: No evidence of thrombus. Normal compressibility and flow on color Doppler imaging. Profunda Femoral Vein: No evidence of thrombus. Normal compressibility and flow on color Doppler imaging. Femoral Vein: No evidence of thrombus. Normal compressibility, respiratory phasicity and response to augmentation. Popliteal Vein: No evidence of thrombus. Normal compressibility, respiratory phasicity and response to augmentation. Calf Veins: No evidence of thrombus. Normal compressibility and flow on color Doppler imaging. Other Findings:  None. IMPRESSION: No evidence of bilateral lower extremity deep venous thrombosis. Ester Sides, MD [facility] Radiology Electronically Signed   By: Ester Sides M.D.   On: 10/17/2023 07:23   CT CHEST WO CONTRAST Result Date: 10/16/2023 CLINICAL DATA:  Pneumonia suspected, uncomplicated, no prior imaging (Ped >= 38mo), abnormal chest x-ray, questionable sepsis EXAM: CT CHEST WITHOUT CONTRAST TECHNIQUE: Multidetector CT imaging of the chest was performed following the standard protocol without IV contrast. RADIATION DOSE REDUCTION: This exam was performed according to the departmental dose-optimization program which includes automated exposure control, adjustment of the mA and/or kV according to patient size and/or use of iterative reconstruction technique. COMPARISON:  10/16/2023 FINDINGS: Cardiovascular: Unenhanced imaging of the heart is unremarkable without pericardial effusion. Normal caliber of the thoracic aorta. Atherosclerosis of the aorta and coronary vasculature. Assessment of the  vascular lumen cannot be performed without IV contrast.  Mediastinum/Nodes: No enlarged mediastinal or axillary lymph nodes. Thyroid  gland, trachea, and esophagus demonstrate no significant findings. Lungs/Pleura: Scattered areas of linear consolidation are seen within the dependent lower lobes, favoring atelectasis. There is no evidence of acute airspace disease, effusion, or pneumothorax. The central airways are patent. Upper Abdomen: Fat stranding within the gallbladder fossa, with high density material within the gallbladder consistent with sludge or multiple small stones. If there is concern for gallbladder pathology, right upper quadrant ultrasound may be useful. Musculoskeletal: No acute or destructive bony abnormalities. Reconstructed images demonstrate no additional findings. IMPRESSION: 1. Minimal dependent atelectasis within the lower lobes, likely accounting for prior chest x-ray finding. No acute airspace disease or signs of pneumonia. 2. High density material within the gallbladder and fat stranding in the gallbladder fossa, incompletely imaged on this exam due to slice selection. If gallbladder pathology is suspected, right upper quadrant ultrasound could be performed. 3. Aortic Atherosclerosis (ICD10-I70.0). Coronary artery atherosclerosis. Electronically Signed   By: Ozell Daring M.D.   On: 10/16/2023 18:37   DG Chest 2 View Result Date: 10/16/2023 CLINICAL DATA:  Right basilar opacity on earlier chest x-ray, questionable sepsis EXAM: CHEST - 2 VIEW COMPARISON:  10/16/2023 FINDINGS: Frontal and lateral views of the chest are obtained on 3 images. Cardiac silhouette is unremarkable. There is patchy right lower lobe airspace disease consistent with pneumonia. No effusion or pneumothorax. No acute bony abnormalities. IMPRESSION: 1. Patchy right lower lobe airspace disease consistent with bronchopneumonia. Electronically Signed   By: Ozell Daring M.D.   On: 10/16/2023 15:31    Scheduled Meds:  vitamin C   250 mg Oral BID   aspirin  EC  81 mg Oral  Daily   atorvastatin   80 mg Oral Daily   azithromycin   500 mg Oral QHS   Chlorhexidine  Gluconate Cloth  6 each Topical Daily   feeding supplement (GLUCERNA SHAKE)  237 mL Oral TID BM   fesoterodine   4 mg Oral Daily   heparin   5,000 Units Subcutaneous Q8H   insulin  aspart  0-5 Units Subcutaneous QHS   insulin  aspart  0-9 Units Subcutaneous TID WC   insulin  aspart  3 Units Subcutaneous TID WC   insulin  glargine-yfgn  15 Units Subcutaneous Q24H   [START ON 10/18/2023] multivitamin with minerals  1 tablet Oral Daily   mouth rinse  15 mL Mouth Rinse 4 times per day   sertraline   100 mg Oral Daily   Continuous Infusions:  cefTRIAXone  (ROCEPHIN )  IV Stopped (10/17/23 1058)   insulin  Stopped (10/17/23 1237)   lactated ringers  75 mL/hr at 10/17/23 1500   PRN Meds: dextrose , docusate sodium , levalbuterol , mouth rinse  Time spent: 55 minutes  Author: ELVAN SOR. MD [facility] 10/17/2023 3:10 PM  To reach On-call, see care teams to locate the attending and reach out to them via [email or web address]. If 7PM-7AM, please contact night-coverage If you still have difficulty reaching the attending provider, please page the [facility] (Director on Call) for [facility] on amion for assistance.

## 2023-10-17 NOTE — Plan of Care (Signed)
  Problem: Respiratory: Goal: Ability to maintain adequate ventilation will improve Outcome: Progressing Goal: Ability to maintain a clear airway will improve Outcome: Progressing   Problem: Education: Goal: Knowledge of General Education information will improve Description: Including pain rating scale, medication(s)/side effects and non-pharmacologic comfort measures Outcome: Not Progressing   Problem: Clinical Measurements: Goal: Respiratory complications will improve Outcome: Progressing Goal: Cardiovascular complication will be avoided Outcome: Progressing   Problem: Coping: Goal: Level of anxiety will decrease Outcome: Progressing   Problem: Elimination: Goal: Will not experience complications related to bowel motility Outcome: Not Progressing Goal: Will not experience complications related to urinary retention Outcome: Progressing   Problem: Safety: Goal: Ability to remain free from injury will improve Outcome: Progressing   Problem: Skin Integrity: Goal: Risk for impaired skin integrity will decrease Outcome: Not Progressing

## 2023-10-17 NOTE — Plan of Care (Signed)

## 2023-10-17 NOTE — Evaluation (Signed)
 Occupational Therapy Evaluation Patient Details Name: Joyce Brown MRN: [id number] DOB: 1950/06/05 Today's Date: 10/17/2023   History of Present Illness   Pt is a 73 y.o. female with medical history significant of DM type 1, Asthma, Cerebellar Ataxia wheel chair bound, fibromyalgia, GERD, HLD, HTN  OSA on CPAP,  remote s/p tx PE, Stiff person syndrome who presents to ED s/p being found down in hotel after sister called EMS, noted for hyperglycemia upon arrival.    Clinical Impressions Joyce Brown was seen for OT evaluation this date. Prior to hospital admission, pt was recently staying in a hotel ~1 week while waiting for her ALF to have an opening. Reports MOD I using w/c. Pt currently requires MOD A exit bed, assist for trunk - poor sitting balance with heavy L lateral lean. MOD A x2 bed>chair squat pivot t/f. Pt would benefit from skilled OT to address noted impairments and functional limitations (see below for any additional details). Upon hospital discharge, recommend OT follow up <3 hours/day.     If plan is discharge home, recommend the following:   A lot of help with walking and/or transfers;A lot of help with bathing/dressing/bathroom;Assistance with cooking/housework     Functional Status Assessment   Patient has had a recent decline in their functional status and demonstrates the ability to make significant improvements in function in a reasonable and predictable amount of time.     Equipment Recommendations   BSC/3in1     Recommendations for Other Services         Precautions/Restrictions   Precautions Precautions: Fall Recall of Precautions/Restrictions: Intact Restrictions Weight Bearing Restrictions Per Provider Order: No     Mobility Bed Mobility Overal bed mobility: Needs Assistance Bed Mobility: Supine to Sit     Supine to sit: Mod assist, HOB elevated, Used rails          Transfers Overall transfer level: Needs assistance Equipment  used: 2 person hand held assist Transfers: Bed to chair/wheelchair/BSC, Sit to/from Stand Sit to Stand: Mod assist   Squat pivot transfers: Mod assist, +2 physical assistance              Balance Overall balance assessment: Needs assistance Sitting-balance support: No upper extremity supported, Feet supported Sitting balance-Leahy Scale: Poor   Postural control: Left lateral lean Standing balance support: Bilateral upper extremity supported Standing balance-Leahy Scale: Poor                             ADL either performed or assessed with clinical judgement   ADL Overall ADL's : Needs assistance/impaired                                       General ADL Comments: MOD A x2 simulated BSC t/f. MOD A don B socks in sitting, assist for balance 2/2 L lateral lean     Vision         Perception         Praxis         Pertinent Vitals/Pain Pain Assessment Pain Assessment: No/denies pain     Extremity/Trunk Assessment Upper Extremity Assessment Upper Extremity Assessment: Defer to OT evaluation   Lower Extremity Assessment Lower Extremity Assessment: Generalized weakness       Communication Communication Communication: No apparent difficulties   Cognition Arousal: Alert Behavior During Therapy: [facility] for tasks  assessed/performed, Flat affect Cognition: No family/caregiver present to determine baseline             OT - Cognition Comments: A&O x4, tangential speech                 Following commands: Intact       Cueing  General Comments   Cueing Techniques: Verbal cues      Exercises     Shoulder Instructions      Home Living Family/patient expects to be discharged to::  (pt was staying at a hotel, but was making plans to transition to an ALF)                                        Prior Functioning/Environment Prior Level of Function : Independent/Modified Independent              Mobility Comments: squat pivot to/from WC ADLs Comments: reported modI for ADLs    OT Problem List: Decreased strength;Decreased range of motion;Decreased activity tolerance;Impaired balance (sitting and/or standing);Decreased safety awareness   OT Treatment/Interventions: Therapeutic exercise;Self-care/ADL training;Energy conservation;DME and/or AE instruction;Therapeutic activities;Patient/family education      OT Goals(Current goals can be found in the care plan section)   Acute Rehab OT Goals Patient Stated Goal: to go to ALF OT Goal Formulation: With patient Time For Goal Achievement: 10/31/23 Potential to Achieve Goals: Good ADL Goals Pt Will Perform Grooming: with modified independence;sitting Pt Will Perform Lower Body Dressing: with modified independence;with adaptive equipment;sit to/from stand Pt Will Transfer to Toilet: with modified independence;squat pivot transfer;bedside commode   OT Frequency:  Min 2X/week    Co-evaluation PT/OT/SLP Co-Evaluation/Treatment: Yes Reason for Co-Treatment: For patient/therapist safety;To address functional/ADL transfers PT goals addressed during session: Mobility/safety with mobility OT goals addressed during session: ADL's and self-care      AM-PAC OT 6 Clicks Daily Activity     Outcome Measure Help from another person eating meals?: None Help from another person taking care of personal grooming?: A Little Help from another person toileting, which includes using toliet, bedpan, or urinal?: A Lot Help from another person bathing (including washing, rinsing, drying)?: A Lot Help from another person to put on and taking off regular upper body clothing?: A Little Help from another person to put on and taking off regular lower body clothing?: A Lot 6 Click Score: 16   End of Session Equipment Utilized During Treatment: Gait belt Nurse Communication: Mobility status  Activity Tolerance: Patient tolerated treatment  well Patient left: in chair;with chair alarm set;with call bell/phone within reach  OT Visit Diagnosis: Other abnormalities of gait and mobility (R26.89);Muscle weakness (generalized) (M62.81)                Time: 8658-8642 OT Time Calculation (min): 16 min Charges:  OT General Charges $OT Visit: 1 Visit OT Evaluation $OT Eval Moderate Complexity: 1 Mod  Elston Slot, M.S. OTR/L  10/17/23, 4:08 PM  ascom [phone]

## 2023-10-17 NOTE — Care Management Important Message (Signed)
 Important Message  Patient Details  Name: Joyce Brown MRN: [id number] Date of Birth: 09-Aug-1950   Important Message Given:  Yes - Medicare IM     Joyce Brown 10/17/2023, 10:04 AM

## 2023-10-17 NOTE — Progress Notes (Signed)
*  PRELIMINARY RESULTS* Echocardiogram 2D Echocardiogram has been performed.  Joyce Brown 10/17/2023, 10:19 AM

## 2023-10-17 NOTE — Progress Notes (Signed)
 Initial Nutrition Assessment  DOCUMENTATION CODES:   Non-severe (moderate) malnutrition in context of chronic illness  INTERVENTION:   Glucerna Shake po TID, each supplement provides 220 kcal and 10 grams of protein  MVI po daily   Vitamin C  250mg  po BID  Thiamine  100mg  po daily x 7 days  Pt at high refeed risk; recommend monitor potassium, magnesium and phosphorus labs daily until stable  Daily weights   Pt at high risk for nutrient deficiencies r/t her gastric bypass surgery. Recommend check thiamine , B12, folate, iron , calcium , zinc , copper  and vitamins D, A, E & K yearly.   NUTRITION DIAGNOSIS:   Moderate Malnutrition related to chronic illness as evidenced by moderate fat depletion, moderate muscle depletion, percent weight loss.  GOAL:   Patient will meet greater than or equal to 90% of their needs  MONITOR:   PO intake, Supplement acceptance, Labs, Weight trends, Skin, I & O's  REASON FOR ASSESSMENT:   Malnutrition Screening Tool    ASSESSMENT:   73 y/o female with h/o stiff person syndrome, chronic pain, Type I DM (positive anti-GAD antibodies diagnosed 2007), anxiety, depression, fibromyalgia, HTN, OSA, GERD, HLD, roux-en-y gastric bypass (2014) and SBO s/p laparoscopic lysis of adhesions and reduction of internal hernia (2015) who is admitted with DKA and AKI.  Met with pt in room today. Pt reports good appetite and oral intake at baseline but reports that her oral intake was decreased for ~ 5 days pta. Pt reports that her appetite is poor in hospital; pt ate some applesauce for breakfast this morning. Pt documented to have eaten 75% of her lunch. Pt with a h/o roux-en-y gastric bypass. Pt reports that she drinks chocolate Boost and takes an adult multivitamin and B12 daily at home. Pt reports that she has had some recent weight loss but is unsure how much. Pt reports her UBW is ~162-165lbs. Per chart, pt is down 18lbs(11%) over the past 3-4 months; this is  significant weight loss. RD discussed with pt the importance of adequate nutrition needed to preserve lean muscle. Pt is agreeable to drink chocolate Glucerna in hospital. Pt is at high refeed risk.    Medications reviewed and include: aspirin , lovenox , insulin , Kphos, iron  sucrose, LRS @75ml /hr  Labs reviewed: K 4.5 wnl, BUN 39(H), creat 1.37(H), P 2.3(L), Mg 2.3 wnl Iron  16(L), TIBC 207(L), folate 12.5 wnl, vitamin D  41.64 wnl, B12 3163(H) Wbc- 12.9(H), Hgb 9.6(L), Hct 28.4(L) Cbgs- 152, 213, 170, 125, 147, 165, 149 x 24 hrs AIC 9.1(H)- 03/2023  NUTRITION - FOCUSED PHYSICAL EXAM:  Flowsheet Row Most Recent Value  Orbital Region Mild depletion  Upper Arm Region Moderate depletion  Thoracic and Lumbar Region Mild depletion  Buccal Region No depletion  Temple Region Mild depletion  Clavicle Bone Region Severe depletion  Clavicle and Acromion Bone Region Severe depletion  Scapular Bone Region Mild depletion  Dorsal Hand Moderate depletion  Patellar Region Moderate depletion  Anterior Thigh Region Moderate depletion  Posterior Calf Region Moderate depletion  Edema (RD Assessment) Mild  Hair Reviewed  Eyes Reviewed  Mouth Reviewed  Skin Reviewed  Nails Reviewed   Diet Order:   Diet Order             Diet Carb Modified Fluid consistency: Thin; Room service appropriate? Yes  Diet effective now                  EDUCATION NEEDS:   Education needs have been addressed  Skin:  Skin Assessment: Reviewed RN  Assessment (Stage I buttocks)  Last BM:  7/31  Height:   Ht Readings from Last 1 Encounters:  10/16/23 5' 10 (1.778 m)    Weight:   Wt Readings from Last 1 Encounters:  10/17/23 66.6 kg    Ideal Body Weight:  68 kg  BMI:  Body mass index is 21.07 kg/m.  Estimated Nutritional Needs:   Kcal:  1800-2100kcal/day  Protein:  90-105g/day  Fluid:  1.7-2.0L/day  Augustin Shams MS, RD, LDN If unable to be reached, please send secure chat to RD inpatient  available from 8:00a-4:00p daily

## 2023-10-17 NOTE — Evaluation (Signed)
 Physical Therapy Evaluation Patient Details Name: Joyce Brown MRN: [id number] DOB: March 24, 1950 Today's Date: 10/17/2023  History of Present Illness  Pt is a 73 y.o. female with medical history significant of DM type 1, Asthma, Cerebellar Ataxia wheel chair bound, fibromyalgia, GERD, HLD, HTN  OSA on CPAP,  remote s/p tx PE, Stiff person syndrome who presents to ED s/p being found down in hotel after sister called EMS, noted for hyperglycemia upon arrival.   Clinical Impression  Patient alert, oriented to self, place, somewhat situation (able to provide PLOF but some statements questionable to current situation in evaluation). Per pt at baseline she is modI for [facility] transfers, able to perform ADLs. She was able to initiate bed mobility, required modA to complete, difficulty with trunk control throughout sitting noted (L lateral lean, especially with dynamic activities). Squat pivot to recliner modAx2, pt unable to truly step. Sit <> stand from recliner modA, unable to complete without physical assistance. Up in chair with needs in reach.  Overall the patient demonstrated deficits (see PT Problem List) that impede the patient's functional abilities, safety, and mobility and would benefit from skilled PT intervention.           If plan is discharge home, recommend the following: Two people to help with walking and/or transfers;Two people to help with bathing/dressing/bathroom;Direct supervision/assist for medications management;Help with stairs or ramp for entrance;Assist for transportation;Assistance with cooking/housework   Can travel by private vehicle   No    Equipment Recommendations Other (comment) (TBD)  Recommendations for Other Services       Functional Status Assessment Patient has had a recent decline in their functional status and demonstrates the ability to make significant improvements in function in a reasonable and predictable amount of time.     Precautions /  Restrictions Precautions Precautions: Fall Recall of Precautions/Restrictions: Intact Restrictions Weight Bearing Restrictions Per Provider Order: No      Mobility  Bed Mobility Overal bed mobility: Needs Assistance Bed Mobility: Supine to Sit     Supine to sit: HOB elevated, Used rails, Mod assist     General bed mobility comments: pt able to initiate, required modA to complete safely, weight shift, trunk control    Transfers Overall transfer level: Needs assistance Equipment used: 2 person hand held assist Transfers: Bed to chair/wheelchair/BSC, Sit to/from Stand Sit to Stand: Mod assist     Squat pivot transfers: Mod assist, +2 physical assistance          Ambulation/Gait               General Gait Details: pt does not ambulate at baseline  Stairs            Wheelchair Mobility     Tilt Bed    Modified Rankin (Stroke Patients Only)       Balance Overall balance assessment: Needs assistance Sitting-balance support: Feet supported, Bilateral upper extremity supported, Single extremity supported Sitting balance-Leahy Scale: Poor   Postural control: Left lateral lean   Standing balance-Leahy Scale: Poor                               Pertinent Vitals/Pain Pain Assessment Pain Assessment: No/denies pain    Home Living Family/patient expects to be discharged to::  (pt was staying at a hotel, but was making plans to transition to an ALF)  Prior Function Prior Level of Function : Independent/Modified Independent             Mobility Comments: squat pivot to/from WC ADLs Comments: reported modI for ADLs     Extremity/Trunk Assessment   Upper Extremity Assessment Upper Extremity Assessment: Defer to OT evaluation    Lower Extremity Assessment Lower Extremity Assessment: Generalized weakness       Communication   Communication Communication: No apparent difficulties    Cognition  Arousal: Alert Behavior During Therapy: WFL for tasks assessed/performed                           PT - Cognition Comments: pt oriented to self, place, situation (but questionable statements during session pt may flucuate) Following commands: Intact       Cueing Cueing Techniques: Verbal cues, Gestural cues, Tactile cues     General Comments      Exercises     Assessment/Plan    PT Assessment Patient needs continued PT services  PT Problem List Decreased strength;Decreased activity tolerance;Decreased knowledge of use of DME;Decreased balance;Decreased safety awareness;Decreased knowledge of precautions;Decreased mobility       PT Treatment Interventions DME instruction;Balance training;Gait training;Neuromuscular re-education;Stair training;Functional mobility training;Patient/family education;Therapeutic activities;Therapeutic exercise    PT Goals (Current goals can be found in the Care Plan section)  Acute Rehab PT Goals Patient Stated Goal: to get better PT Goal Formulation: With patient Time For Goal Achievement: 10/31/23 Potential to Achieve Goals: Good    Frequency Min 3X/week     Co-evaluation PT/OT/SLP Co-Evaluation/Treatment: Yes Reason for Co-Treatment: For patient/therapist safety;To address functional/ADL transfers PT goals addressed during session: Mobility/safety with mobility OT goals addressed during session: ADL's and self-care       AM-PAC PT 6 Clicks Mobility  Outcome Measure Help needed turning from your back to your side while in a flat bed without using bedrails?: A Lot Help needed moving from lying on your back to sitting on the side of a flat bed without using bedrails?: A Lot Help needed moving to and from a bed to a chair (including a wheelchair)?: A Lot Help needed standing up from a chair using your arms (e.g., wheelchair or bedside chair)?: A Lot Help needed to walk in hospital room?: Total Help needed climbing 3-5 steps  with a railing? : Total 6 Click Score: 10    End of Session Equipment Utilized During Treatment: Gait belt Activity Tolerance: Patient tolerated treatment well Patient left: in chair;with call bell/phone within reach;with chair alarm set Nurse Communication: Mobility status PT Visit Diagnosis: Other abnormalities of gait and mobility (R26.89);Difficulty in walking, not elsewhere classified (R26.2);Muscle weakness (generalized) (M62.81)    Time: 8658-8642 PT Time Calculation (min) (ACUTE ONLY): 16 min   Charges:   PT Evaluation $PT Eval Low Complexity: 1 Low   PT General Charges $$ ACUTE PT VISIT: 1 Visit         Doyal Shams PT, DPT 4:02 PM,10/17/23

## 2023-10-17 NOTE — Inpatient Diabetes Management (Addendum)
 Inpatient Diabetes Program Recommendations  AACE/ADA: New Consensus Statement on Inpatient Glycemic Control   Target Ranges:  Prepandial:   less than 140 mg/dL      Peak postprandial:   less than 180 mg/dL (1-2 hours)      Critically ill patients:  140 - 180 mg/dL    Latest Reference Range & Units 10/17/23 00:27 10/17/23 01:01 10/17/23 01:55 10/17/23 02:55 10/17/23 03:52 10/17/23 04:52 10/17/23 05:48 10/17/23 08:52  Glucose-Capillary 70 - 99 mg/dL 552 (H) 600 (H) 686 (H) 252 (H) 166 (H) 149 (H) 165 (H) 125 (H)    Latest Reference Range & Units 10/16/23 12:45  CO2 22 - 32 mmol/L 8 (L)  Glucose 70 - 99 mg/dL 8,828 (HH)  Anion gap 5 - 15  29 (H)    Latest Reference Range & Units 10/16/23 12:43 10/16/23 15:09 10/16/23 18:34 10/17/23 01:32  Beta-Hydroxybutyric Acid 0.05 - 0.27 mmol/L >8.00 (H) >8.00 (H) >8.00 (H) 0.49 (H)   Review of Glycemic Control  Diabetes history: DM1 Outpatient Diabetes medications: OmniPod Insulin  Pump with Novolog  and Dexcom G7 CGM; when not on pump Tresiba  18 units daily, Novolog  1 unit per 10 grams of carbs, Novolog  1 unit per 50 mg/dl over target glucose Current orders for Inpatient glycemic control: IV insulin  per DKA  Inpatient Diabetes Program Recommendations:    Insulin : Once provider is ready to transition from IV insulin , please consider ordering Semglee  15 units Q24H, CBGs Q4H, Novolog  0-9 units Q4H, and when diet ordered - Novolog  3 units TID with meals for meal coverage if patient eats at least 50% of meals.  Outpatient DM: Patient states she would prefer to use SQ insulin  injections at discharge. If she is discharged back home, she will need Rx for insulin  syringes [phone]).   NOTE: Patient with DM1 hx that uses OmniPod insulin  pump for DM management admitted with DKA, CAP with sepsis, AKI, and  hyperkalemia. Per ED note on 10/16/23 by. Dr. Suzanne, patient found down in hotel and patient is currently staying in a hotel waiting to go to possible [facility]  house.  Per chart review, patient goes to [facility] and was seen by Dr. Standley on 07/25/23 and per note changed insulin :carb ratio to 1:8 today. For when you're off pump: Tresiba  18 units/d, Novolog  with meals based on carb intake, 1 unit per 10 grams.  Will plan to speak with patient today.   Addendum 10/17/23@10 :35-Spoke with patient at bedside in ICU. Patient reports that she uses OmniPod 5 with Novolog  and Dexcom G7 CGM for DM control. Patient states that her OmniPod and Dexcom expired and she was not able to get up after fall to change them out. Patient likely went into DKA because her OmniPod pump expired and she was not receiving any insulin . Discussed initial glucose of 1171 mg/dl and DKA. Patient states she has no pump supplies with her at the hospital. She does not have PDM for OmniPod so not able to get insulin  pump settings. Patient reports that she does not know any of her pump settings but when she is not using her pump, she takes Tresiba  18 units daily, Novolog  1 unit per 10 grams of carbs, and Novolog  1 unit to drop glucose 50 mg/dl.   Patient states that she has Tresiba  insulin  pens and Novolog  insulin  vials at the hotel she was staying in while she is awaiting placement into [facility]. Patient states that she would prefer to stay on SQ insulin  injections for now and at  discharge.  Informed patient that I would reach out to [facility] to make them aware that she was in the process of getting placement to [facility] so they can assist with placement.  Discussed that since acidosis has cleared, she has been transitioned to SQ insulin  regimen.  Patient reports she is feeling much better and she plans to try to eat (has late breakfast tray at bedside). Patient verbalized understanding of information and has no questions at this time.  Thanks, Earnie Gainer, RN, MSN, CDCES Diabetes Coordinator Inpatient Diabetes Program [phone] (Team Pager from 8am to 5pm)

## 2023-10-17 NOTE — Plan of Care (Signed)
  Problem: Respiratory: Goal: Ability to maintain adequate ventilation will improve Outcome: Progressing Goal: Ability to maintain a clear airway will improve Outcome: Progressing   Problem: Clinical Measurements: Goal: Ability to maintain clinical measurements within normal limits will improve Outcome: Progressing Goal: Will remain free from infection Outcome: Progressing Goal: Diagnostic test results will improve Outcome: Progressing Goal: Respiratory complications will improve Outcome: Progressing Goal: Cardiovascular complication will be avoided Outcome: Progressing   Problem: Nutrition: Goal: Adequate nutrition will be maintained Outcome: Progressing   Problem: Elimination: Goal: Will not experience complications related to urinary retention Outcome: Progressing   Problem: Pain Managment: Goal: General experience of comfort will improve and/or be controlled Outcome: Progressing   Problem: Safety: Goal: Ability to remain free from injury will improve Outcome: Progressing

## 2023-10-18 DIAGNOSIS — E440 Moderate protein-calorie malnutrition: Secondary | ICD-10-CM | POA: Insufficient documentation

## 2023-10-18 DIAGNOSIS — E1010 Type 1 diabetes mellitus with ketoacidosis without coma: Secondary | ICD-10-CM | POA: Diagnosis not present

## 2023-10-18 LAB — PHOSPHORUS: Phosphorus: 2.8 mg/dL (ref 2.5–4.6)

## 2023-10-18 LAB — BASIC METABOLIC PANEL WITH GFR
Anion gap: 8 (ref 5–15)
BUN: 30 mg/dL — ABNORMAL HIGH (ref 8–23)
CO2: 24 mmol/L (ref 22–32)
Calcium: 8 mg/dL — ABNORMAL LOW (ref 8.9–10.3)
Chloride: 109 mmol/L (ref 98–111)
Creatinine, Ser: 0.92 mg/dL (ref 0.44–1.00)
GFR, Estimated: >60 mL/min (ref >60)
Glucose, Bld: 211 mg/dL — ABNORMAL HIGH (ref 70–99)
Potassium: 4.1 mmol/L (ref 3.5–5.1)
Sodium: 141 mmol/L (ref 135–145)

## 2023-10-18 LAB — CBC
HCT: 29 % — ABNORMAL LOW (ref 36.0–46.0)
Hemoglobin: 9.6 g/dL — ABNORMAL LOW (ref 12.0–15.0)
MCH: 31.4 pg (ref 26.0–34.0)
MCHC: 33.1 g/dL (ref 30.0–36.0)
MCV: 94.8 fL (ref 80.0–100.0)
Platelets: 160 K/uL (ref 150–400)
RBC: 3.06 MIL/uL — ABNORMAL LOW (ref 3.87–5.11)
RDW: 14.9 % (ref 11.5–15.5)
WBC: 7.9 K/uL (ref 4.0–10.5)
nRBC: 0 % (ref 0.0–0.2)

## 2023-10-18 LAB — GLUCOSE, CAPILLARY
Glucose-Capillary: 265 mg/dL — ABNORMAL HIGH (ref 70–99)
Glucose-Capillary: 297 mg/dL — ABNORMAL HIGH (ref 70–99)
Glucose-Capillary: 324 mg/dL — ABNORMAL HIGH (ref 70–99)
Glucose-Capillary: 219 mg/dL — ABNORMAL HIGH (ref 70–99)

## 2023-10-18 LAB — MAGNESIUM: Magnesium: 2.2 mg/dL (ref 1.7–2.4)

## 2023-10-18 MED ORDER — GABAPENTIN 100 MG PO CAPS
200.0000 mg | ORAL_CAPSULE | Freq: Three times a day (TID) | ORAL | Status: DC
  Administered 2023-10-18 – 2023-10-26 (×31): 200 mg via ORAL
  Filled 2023-10-18 (×24): qty 2

## 2023-10-18 MED ORDER — INSULIN ASPART 100 UNIT/ML IJ SOLN
4.0000 [IU] | Freq: Three times a day (TID) | INTRAMUSCULAR | Status: DC
  Administered 2023-10-18 – 2023-10-20 (×6): 4 [IU] via SUBCUTANEOUS
  Filled 2023-10-18 (×6): qty 1

## 2023-10-18 MED ORDER — ACETAMINOPHEN 325 MG PO TABS: 650.0000 mg | ORAL_TABLET | Freq: Four times a day (QID) | ORAL | Status: DC | PRN

## 2023-10-18 MED ORDER — INSULIN GLARGINE-YFGN 100 UNIT/ML ~~LOC~~ SOLN
18.0000 [IU] | SUBCUTANEOUS | Status: DC
  Filled 2023-10-18: qty 0.18

## 2023-10-18 MED ORDER — TIZANIDINE HCL 4 MG PO TABS: 2.0000 mg | ORAL_TABLET | Freq: Three times a day (TID) | ORAL | Status: DC | PRN

## 2023-10-18 NOTE — Progress Notes (Signed)
 Patient is not able to walk the distance required to go the bathroom, or he/she is unable to safely negotiate stairs required to access the bathroom.  A 3in1 BSC will alleviate this problem

## 2023-10-18 NOTE — Inpatient Diabetes Management (Signed)
 Inpatient Diabetes Program Recommendations  AACE/ADA: New Consensus Statement on Inpatient Glycemic Control   Target Ranges:  Prepandial:   less than 140 mg/dL      Peak postprandial:   less than 180 mg/dL (1-2 hours)      Critically ill patients:  140 - 180 mg/dL    Latest Reference Range & Units 10/17/23 05:48 10/17/23 06:51 10/17/23 08:52 10/17/23 10:52 10/17/23 12:38 10/17/23 16:07 10/17/23 21:48 10/18/23 07:26  Glucose-Capillary 70 - 99 mg/dL 834 (H) 852 (H) 874 (H) 170 (H) 213 (H) 152 (H) 209 (H) 265 (H)   Review of Glycemic Control  Diabetes history: DM1 Outpatient Diabetes medications: OmniPod Insulin  Pump with Novolog  and Dexcom G7 CGM; when not on pump Tresiba  18 units daily, Novolog  1 unit per 10 grams of carbs, Novolog  1 unit per 50 mg/dl over target glucose Current orders for Inpatient glycemic control: Semglee  15 units Q24H, Novolog  3 units TID with meals, Novolog  0-9 units TID with meals, Novolog  0-5 units QHS  Inpatient Diabetes Program Recommendations:    Insulin : Please consider increasing Semglee  to 18 units Q24H and meal coverage to Novolog  4 units TID with meals.  Outpatient DM: Patient states she would prefer to use SQ insulin  injections at discharge. If she is discharged back home, she will need Rx for insulin  syringes [phone]).   Thanks, Earnie Gainer, RN, MSN, CDCES Diabetes Coordinator Inpatient Diabetes Program [phone] (Team Pager from 8am to 5pm)

## 2023-10-18 NOTE — Progress Notes (Addendum)
 Occupational Therapy Treatment Patient Details Name: Joyce Brown MRN: [id number] DOB: [birth year] Today's Date: 10/18/2023   History of present illness Pt is a 73 y.o. female with medical history significant of DM type 1, Asthma, Cerebellar Ataxia wheel chair bound, fibromyalgia, GERD, HLD, HTN  OSA on CPAP,  remote s/p tx PE, Stiff person syndrome who presents to ED s/p being found down in hotel after sister called EMS, noted for hyperglycemia upon arrival.   OT comments  Ms Maya was seen for OT treatment on this date. Upon arrival to room pt in bed, agreeable to tx. Pt requires CGA exit bed, improved sitting balance. SETUP + SUP don B shoes in sitting. MOD A + RW step pivot t/f bed>chair. Tolerated x4 STS at chair with MIN A + RW, assist for lift off. Completed standing marching x3 trials, 4 steps each, with MOD A. Educated on d/c recs and HEP. Pt making good progress toward goals, will continue to follow POC. Discharge recommendation updated to reflect improved tolerance and progress with mobility.       If plan is discharge home, recommend the following:  A lot of help with walking and/or transfers;A lot of help with bathing/dressing/bathroom;Assistance with cooking/housework   Equipment Recommendations  BSC/3in1    Recommendations for Other Services      Precautions / Restrictions Precautions Precautions: Fall Recall of Precautions/Restrictions: Intact Restrictions Weight Bearing Restrictions Per Provider Order: No       Mobility Bed Mobility Overal bed mobility: Needs Assistance Bed Mobility: Supine to Sit     Supine to sit: Contact guard          Transfers Overall transfer level: Needs assistance Equipment used: Rolling walker (2 wheels) Transfers: Sit to/from Stand, Bed to chair/wheelchair/BSC Sit to Stand: Min assist     Step pivot transfers: Mod assist     General transfer comment: assist for lift off     Balance Overall balance assessment:  Needs assistance Sitting-balance support: Feet supported, Bilateral upper extremity supported, Single extremity supported Sitting balance-Leahy Scale: Fair     Standing balance support: Bilateral upper extremity supported Standing balance-Leahy Scale: Poor                             ADL either performed or assessed with clinical judgement   ADL Overall ADL's : Needs assistance/impaired                                       General ADL Comments: MOD A simulated BSC t/f. SETUP self-feeding in sitting    Extremity/Trunk Assessment              Vision       Perception     Praxis     Communication Communication Communication: No apparent difficulties   Cognition Arousal: Alert Behavior During Therapy: WFL for tasks assessed/performed Cognition: No apparent impairments             OT - Cognition Comments: tangential                 Following commands: Intact        Cueing   Cueing Techniques: Visual cues, Verbal cues  Exercises      Shoulder Instructions       General Comments      Pertinent Vitals/ Pain  Pain Assessment Pain Assessment: No/denies pain   Frequency  Min 2X/week        Progress Toward Goals  OT Goals(current goals can now be found in the care plan section)  Progress towards OT goals: Progressing toward goals  Acute Rehab OT Goals OT Goal Formulation: With patient Time For Goal Achievement: 10/31/23 Potential to Achieve Goals: Good ADL Goals Pt Will Perform Grooming: with modified independence;sitting Pt Will Perform Lower Body Dressing: with modified independence;with adaptive equipment;sit to/from stand Pt Will Transfer to Toilet: with modified independence;squat pivot transfer;bedside commode  Plan      Co-evaluation                 AM-PAC OT 6 Clicks Daily Activity     Outcome Measure   Help from another person eating meals?: None Help from another person taking  care of personal grooming?: A Little Help from another person toileting, which includes using toliet, bedpan, or urinal?: A Lot Help from another person bathing (including washing, rinsing, drying)?: A Lot Help from another person to put on and taking off regular upper body clothing?: A Little Help from another person to put on and taking off regular lower body clothing?: A Lot 6 Click Score: 16    End of Session Equipment Utilized During Treatment: Gait belt;Rolling walker (2 wheels)  OT Visit Diagnosis: Other abnormalities of gait and mobility (R26.89);Muscle weakness (generalized) (M62.81)   Activity Tolerance Patient tolerated treatment well   Patient Left in chair;with call bell/phone within reach;with chair alarm set   Nurse Communication Mobility status        Time: 9062-8998 OT Time Calculation (min): 24 min  Charges: OT General Charges $OT Visit: 1 Visit OT Treatments $Self Care/Home Management : 8-22 mins $Therapeutic Activity: 8-22 mins  Elston Slot, M.S. OTR/L  10/18/23, 1:45 PM  ascom [phone]

## 2023-10-18 NOTE — Progress Notes (Signed)
 Pt onto clean floor bed and transported to room 146 accompanied by Nurse Tech. No telemetry needed per Dr. Von.

## 2023-10-18 NOTE — TOC Initial Note (Addendum)
 Transition of Care [facility]) - Initial/Assessment Note    Patient Details  Name: Joyce Brown MRN: [id number] Date of Birth: 30-Oct-1950  Transition of [facility]) CM/SW Contact:    [place] L Orphia Mctigue, LCSW Phone Number: 10/18/2023, 11:07 AM  Clinical Narrative:                  CSW attempted to contact Christian, [facility], to discuss discharge planning. A message was left requesting a return call.     1:57pm: FL2 signed and sent to [facility] for review. CSW notified that patient is a potential candidate for CIR.    3:28pm: BSC delivered to patient room.    Patient Goals and CMS Choice            Expected Discharge Plan and Services                                              Prior Living Arrangements/Services                       Activities of Daily Living   ADL Screening (condition at time of admission) Independently performs ADLs?: Yes (appropriate for developmental age) Is the patient deaf or have difficulty hearing?: Yes Does the patient have difficulty seeing, even when wearing glasses/contacts?: Yes Does the patient have difficulty concentrating, remembering, or making decisions?: Yes  Permission Sought/Granted                  Emotional Assessment              Admission diagnosis:  DKA (diabetic ketoacidosis) (HCC) [E11.10] Hyperglycemia [R73.9] Fall, initial encounter [W19.XXXA] Diabetic ketoacidosis without coma associated with type 1 diabetes mellitus (HCC) [E10.10] Patient Active Problem List   Diagnosis Date Noted   Malnutrition of moderate degree 10/18/2023   DKA (diabetic ketoacidosis) (HCC) 10/16/2023   Age-related osteoporosis without current pathological fracture 09/21/2023   Diplopia 06/01/2023   Aortic atherosclerosis (HCC) 04/19/2023   HTN (hypertension) 04/11/2023   Asthma 04/11/2023   Polyp of cecum 03/24/2023   Cervical radicular pain 03/02/2023   Insulin  pump status 01/26/2023   Encounter  for annual wellness visit (AWV) in Medicare patient 01/11/2023   Cervicalgia 12/09/2022   Arthritis of carpometac[facility]al [facility]) joint of both thumbs 12/09/2022   Healthcare maintenance 11/10/2022   Facial rash 08/17/2022   Rectal prolapse 08/17/2022   Dry eye 08/12/2022   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   Bilateral tennis elbow 06/15/2021   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   Type 1 diabetes mellitus with diabetic polyneuropathy (HCC) 12/25/2020   Diabetic neuropathy (HCC) 12/09/2020   Myofascial pain 09/12/2020   Wheelchair dependence 09/12/2020   Lumbar facet arthropathy 04/27/2020   Bilateral impacted cerumen 08/21/2019   Sensorineural hearing loss (SNHL) of both ears 08/21/2019   End of life care 08/15/2019   Tinnitus, bilateral 06/27/2019   Breast calcifications 12/28/2017   Hypokalemia 12/06/2017   Cerebellar ataxia (HCC)    Vitamin B6 deficiency 11/10/2017   Gastric bypass status for obesity 10/20/2016   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   Status post bariatric surgery 10/20/2016   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) 06/20/2016   Depression 06/16/2015   Hyperparathyroidism (HCC) 10/29/2014   Type 1 diabetes mellitus without complication (HCC) 10/01/2014   Urge incontinence of urine 05/28/2014  Dyslipidemia 02/19/2014   Spondylosis without myelopathy or radiculopathy, lumbar region 12/17/2013   Fibromyalgia 03/26/2013   Impaired mobility and activities of daily living 11/30/2012   Lichen planus 10/30/2012   Spondylosis of cervical spine 05/16/2012   Adjustment disorder with mixed anxiety and depressed mood 05/11/2012   Spinal stenosis of lumbar region 04/28/2012   Degeneration of lumbar intervertebral disc 04/26/2012   Osteoarthritis of hand 05/24/2011   PCP:  Sharma Coyer, MD Pharmacy:   [id number] [address], [place] - [address] [place]  [address] Phone: [phone] Fax: [phone]  [facility] - [place], [place] - [address] [place] [address] Phone: [phone] Fax: [phone]     Social Drivers of Health (SDOH) Social History: SDOH Screenings   Food Insecurity: Patient Unable To Answer (10/16/2023)  Housing: Unknown (10/16/2023)  Transportation Needs: Patient Unable To Answer (10/16/2023)  Utilities: Patient Unable To Answer (10/16/2023)  Alcohol  Screen: Low Risk  (01/11/2023)  Depression (PHQ2-9): Low Risk  (10/14/2023)  Financial Resource Strain: Low Risk  (09/14/2023)  Physical Activity: Insufficiently Active (09/14/2023)  Social Connections: Patient Unable To Answer (10/16/2023)  Recent Concern: Social Connections - Socially Isolated (09/14/2023)  Stress: No Stress Concern Present (09/14/2023)  Tobacco Use: Low Risk  (10/16/2023)  Health Literacy: Adequate Health Literacy (05/12/2023)   SDOH Interventions:     Readmission Risk Interventions     No data to display

## 2023-10-18 NOTE — Progress Notes (Signed)
 [facility] Progress Note  Patient: Joyce Brown    FMW:[id number]  DOA: 10/16/2023     Date of Service: the patient was seen and examined on 10/18/2023  Chief Complaint  Patient presents with   Fall   Hyperglycemia   Brief hospital course: Joyce Brown is a 73 y.o. female with medical history significant of DM type 1, Asthma, Cerebellar Ataxia wheel chair bound, fibromyalgia, GERD, HLD, HTN  OSA on CPAP,  remote s/p tx PE, Stiff person syndrome who presents to ED s/p being found down in hotel after sister called EMS for well as patient did not sound her self on phone call. Patient states she was attempting to transfer herself from her bed to chair and fell.  Patient notes prior to this she was at her baseline health although on ros she has noted today she has had increase in sob. She notes he sugar was within range the last time she checked it.  Patient has insulin  pump.  She notes no hx of CVA and no new weakness and noted her speech at times is slurred and her sister corroborates this. Patient on further ros noted no cough/fever/chills /n/v/d/ dysuria or abdominal pain. She notes she is at her baseline strength she does not note new paresthesias. She states current she feels much improved from this am.     ED Course:  Vitals afeb , bp 103/54, hr 70, rr 22, sat 99%  Glucose >600 Lactic 4.1 ,3.9 Beta -hydroxy>8 Na 125, K 6.7, cl 88, bicarb 8, glu 1171, cr 2.28 ( 0.93)  Wbc 12.7, hgb 10.6,  pmn 10.5 Vbg: 7/26 NS  1L EKG: snr  incomplete RBBB CE 98, 114 Latic 3.9 CXR: 1. Question developing right lower lung zone airspace opacity. Recommend further evaluation with chest x-ray PA and lateral view. 2.  Aortic Atherosclerosis (ICD10-I70.0).     Head CT: 1. No evidence of an acute intracranial abnormality. 2. Mild parenchymal atrophy and chronic small vessel ischemic disease.   CT cervical spine:1. No evidence of an acute cervical spine fracture. 2. Mild grade 1  anterolisthesis at C3-C4, C4-C5 and C7-T1. 3. Cervical spondylosis as described.  Tx insulin  drip , cefepime ,vanc ,     Assessment and Plan:  # Type1 DM with DKA S/p insulin  IV infusion as per DKA protocol. Anion gap closed, acidosis resolved Transition to subcu insulin  Started Semglee  15 units nightly, NovoLog  3 units 3 times daily and sliding scale Monitor CBG, continue carb modified diet Continue IV fluid for another 24 hours.  # Suspected pneumonia, ruled out CT chest negative for pneumonia RVP negative Leukocytosis and tachypnea could be secondary to DKA S/p azithromycin  and ceftriaxone  DC antibiotics and monitor    # Elevated troponin, most likely due to demand ischemia Patient denied any chest pain TTE LVEF 65 to 70%, no WMA, no significant valvular abnormality.  Negative PFO   # AKI, due to dehydration secondary to DKA.  Resolved Creatinine improved s/p IV fluid sCr 2.08>> 1.37>>0.92 Monitor renal functions daily  # Hypophosphatemia due to nutritional deficiency. Phos repleted.  Resolved Monitor electrolytes and replete as needed.    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  Follow-up with PCP to repeat iron  profile after 3 to 6 months.   Body mass index is 20.24 kg/m.  Interventions:  Diet: Carb modified diet DVT Prophylaxis: Subcutaneous Lovenox    Advance goals of care discussion:  Full code  Family Communication: family was not present at bedside, at the time of interview.  The pt provided permission to discuss medical plan with the family. Opportunity was given to ask question and all questions were answered satisfactorily.   Disposition:  Pt is from home, lives in [place], admitted with DKA, s/p DKA protocol.  Currently stable to discharge awaiting for SNF placement. Follow TOC for SNF placement  Subjective: No significant events overnight.  Patient was feeling a little jittery otherwise no new active issues.  Denied any chest pain or palpitation, no shortness of breath.  Physical Exam: General: NAD, lying comfortably Appear in no distress, affect appropriate Eyes: PERRLA ENT: Oral Mucosa Clear, moist  Neck: no JVD,  Cardiovascular: S1 and S2 Present, no Murmur,  Respiratory: good respiratory effort, Bilateral Air entry equal and Decreased, no Crackles, no wheezes Abdomen: Bowel Sound present, Soft and no tenderness,  Skin: no rashes Extremities: no Pedal edema, no calf tenderness Neurologic: without any new focal findings, chronic cerebellar ataxia and dysarthria. Gait not checked due to patient safety concerns  Vitals:   10/18/23 1307 10/18/23 1400 10/18/23 1500 10/18/23 1615  BP: (!) 139/59 137/79 (!) 144/63 (!) 138/59  Pulse: 76 77 72 65  Resp: 16 20 19 18   Temp:    98.1 F (36.7 C)  TempSrc:      SpO2: 100% 100% 100% 100%  Weight:      Height:        Intake/Output Summary (Last 24 hours) at 10/18/2023 1615 Last data filed at 10/18/2023 1541 Gross per 24 hour  Intake 2497.14 ml  Output 2540 ml  Net -42.86 ml   Filed Weights   10/16/23 1243 10/17/23 0400 10/18/23 0350  Weight: 78.9 kg 66.6 kg 64 kg    Data Reviewed: I have personally reviewed and interpreted daily labs, tele strips, imagings as discussed above. I reviewed all nursing notes, pharmacy notes, vitals, pertinent old records I have discussed plan of care as described above with  RN and patient/family.  CBC: Recent Labs  Lab 10/16/23 1245 10/17/23 0134 10/18/23 0411  WBC 12.7* 12.9* 7.9  NEUTROABS 10.5*  --   --   HGB 10.6* 9.6* 9.6*  HCT 37.6 28.4* 29.0*  MCV 111.9* 92.5 94.8  PLT 204 169 160   Basic Metabolic Panel: Recent Labs  Lab 10/17/23 0134 10/17/23 0828 10/17/23 1045 10/17/23 1305 10/18/23 0411  NA 134* 137 139 138 141  K 4.1 4.5 4.5 4.5 4.1  CL 105 108 106 107 109  CO2 21* 24 24 23 24   GLUCOSE 389* 143* 186* 217* 211*  BUN 44* 41* 39* 39* 30*  CREATININE 1.61* 1.48* 1.39* 1.37* 0.92  CALCIUM  8.2* 8.5* 8.6* 8.5* 8.0*  MG  --   --  2.3  --  2.2  PHOS  --   --  2.3*  --  2.8    Studies:  No results found.   Scheduled Meds:  vitamin C   250 mg Oral BID   aspirin  EC  81 mg Oral Daily   atorvastatin   80 mg Oral Daily   Chlorhexidine  Gluconate Cloth  6 each Topical Daily   enoxaparin  (LOVENOX ) injection  40 mg Subcutaneous QPM   feeding supplement (GLUCERNA SHAKE)  237 mL Oral TID BM   fesoterodine   4 mg Oral Daily   insulin  aspart  0-5 Units Subcutaneous QHS   insulin  aspart  0-9 Units Subcutaneous TID WC   insulin  aspart  4 Units Subcutaneous TID WC   [START ON 10/19/2023] insulin  glargine-yfgn  18 Units Subcutaneous Q24H   multivitamin with minerals  1 tablet Oral Daily   mouth rinse  15 mL Mouth Rinse 4 times per day   sertraline   100 mg Oral Daily   thiamine   100 mg Oral Daily   Continuous Infusions:   PRN Meds: dextrose , docusate sodium , levalbuterol , mouth rinse  Time spent: 40 minutes  Author: ELVAN SOR. MD [facility] 10/18/2023 4:15 PM  To reach On-call, see care teams to locate the attending and reach out to them via [email or web address]. If 7PM-7AM, please contact night-coverage If you still have difficulty reaching the attending provider, please page the [facility] (Director on Call) for [facility] on amion for assistance.

## 2023-10-18 NOTE — Progress Notes (Addendum)
 Report given to [facility] via phone. Will transfer patient once physical bed arrives from supply, (ordered) as there was no bed in room 146. Pt updated. No telemetry

## 2023-10-18 NOTE — Progress Notes (Signed)
 Attempted to call report; nurse phone has bust signal. Secretary to have nurse call ICU to receive report.

## 2023-10-18 NOTE — Progress Notes (Signed)

## 2023-10-18 NOTE — [place] FL2 (Signed)
 [place]  MEDICAID FL2 LEVEL OF CARE FORM     IDENTIFICATION  Patient Name: Joyce Brown Birthdate: [birth year] Sex: female Admission Date (Current Location): 10/16/2023  [facility] and [place] Number:  Chiropodist and Address:  [facility], [address], [place], [place] [address]      Provider Number: [id number]  Attending Physician Name and Address:  Von Bellis, MD  Relative Name and Phone Number:  [facility] [phone]    Current Level of Care: Hospital Recommended Level of Care: Assisted Living Facility Prior Approval Number:    Date Approved/Denied:   PASRR Number: [id number]  Discharge Plan: Other (Comment) (ALF)    Current Diagnoses: Patient Active Problem List   Diagnosis Date Noted   Malnutrition of moderate degree 10/18/2023   DKA (diabetic ketoacidosis) (HCC) 10/16/2023   Age-related osteoporosis without current pathological fracture 09/21/2023   Diplopia 06/01/2023   Aortic atherosclerosis (HCC) 04/19/2023   HTN (hypertension) 04/11/2023   Asthma 04/11/2023   Polyp of cecum 03/24/2023   Cervical radicular pain 03/02/2023   Insulin  pump status 01/26/2023   Encounter for annual wellness visit (AWV) in Medicare patient 01/11/2023   Cervicalgia 12/09/2022   Arthritis of carpometacarpal [facility]) joint of both thumbs 12/09/2022   Healthcare maintenance 11/10/2022   Facial rash 08/17/2022   Rectal prolapse 08/17/2022   Dry eye 08/12/2022   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   Bilateral tennis elbow 06/15/2021   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   Type 1 diabetes mellitus with diabetic polyneuropathy (HCC) 12/25/2020   Diabetic neuropathy (HCC) 12/09/2020   Myofascial pain 09/12/2020   Wheelchair dependence 09/12/2020   Lumbar facet arthropathy 04/27/2020   Bilateral impacted cerumen 08/21/2019   Sensorineural hearing loss (SNHL) of both ears 08/21/2019   End of life care 08/15/2019   Tinnitus,  bilateral 06/27/2019   Breast calcifications 12/28/2017   Hypokalemia 12/06/2017   Cerebellar ataxia (HCC)    Vitamin B6 deficiency 11/10/2017   Gastric bypass status for obesity 10/20/2016   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   Status post bariatric surgery 10/20/2016   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) 06/20/2016   Depression 06/16/2015   Hyperparathyroidism (HCC) 10/29/2014   Type 1 diabetes mellitus without complication (HCC) 10/01/2014   Urge incontinence of urine 05/28/2014   Dyslipidemia 02/19/2014   Spondylosis without myelopathy or radiculopathy, lumbar region 12/17/2013   Fibromyalgia 03/26/2013   Impaired mobility and activities of daily living 11/30/2012   Lichen planus 10/30/2012   Spondylosis of cervical spine 05/16/2012   Adjustment disorder with mixed anxiety and depressed mood 05/11/2012   Spinal stenosis of lumbar region 04/28/2012   Degeneration of lumbar intervertebral disc 04/26/2012   Osteoarthritis of hand 05/24/2011    Orientation RESPIRATION BLADDER Height & Weight     Self, Time, Situation  Other (Comment) (clear to auscultation bilaterally, no wheezing, no crackles. Normal respiratory effort. No accessory muscle use.  CPAP) External catheter Weight: 141 lb 1.5 oz (64 kg) Height:  5' 10 (177.8 cm)  BEHAVIORAL SYMPTOMS/MOOD NEUROLOGICAL BOWEL NUTRITION STATUS      Continent Diet (Aspiration precautions starting at 08/04 9061  Diet Carb Modified Fluid consistency: Thin; Room service appropriate? Yes: Carb Control starting at 08/04 9078)  AMBULATORY STATUS COMMUNICATION OF NEEDS Skin   Supervision Verbally Other (Comment) (erythemia/dry)                       Personal Care Assistance Level  of Assistance  Bathing, Feeding, Dressing Bathing Assistance: Limited assistance Feeding assistance: Limited assistance Dressing Assistance: Limited assistance     Functional Limitations Info  Sight, Hearing,  Speech          SPECIAL CARE FACTORS FREQUENCY  OT (By licensed OT), PT (By licensed PT)     PT Frequency: 3X OT Frequency: 2X            Contractures Contractures Info: Not present    Additional Factors Info  Code Status, Allergies Code Status Info: FULL Allergies Info: NKA           Current Medications (10/18/2023):  This is the current hospital active medication list Current Facility-Administered Medications  Medication Dose Route Frequency Provider Last Rate Last Admin   ascorbic acid  (VITAMIN C ) tablet 250 mg  250 mg Oral BID Von Bellis, MD   250 mg at 10/18/23 0925   aspirin  EC tablet 81 mg  81 mg Oral Daily Thomas, Sara-Maiz A, MD   81 mg at 10/18/23 9073   atorvastatin  (LIPITOR ) tablet 80 mg  80 mg Oral Daily Thomas, Sara-Maiz A, MD   80 mg at 10/18/23 9074   Chlorhexidine  Gluconate Cloth 2 % PADS 6 each  6 each Topical Daily Debby Camila LABOR, MD   6 each at 10/18/23 1100   dextrose  50 % solution 0-50 mL  0-50 mL Intravenous PRN Suzanne Kirsch, MD       docusate sodium  (COLACE) capsule 100 mg  100 mg Oral Daily PRN Debby Camila LABOR, MD       enoxaparin  (LOVENOX ) injection 40 mg  40 mg Subcutaneous QPM Von Bellis, MD   40 mg at 10/17/23 2152   feeding supplement (GLUCERNA SHAKE) (GLUCERNA SHAKE) liquid 237 mL  237 mL Oral TID BM Von Bellis, MD   237 mL at 10/18/23 0933   fesoterodine  (TOVIAZ ) tablet 4 mg  4 mg Oral Daily Thomas, Sara-Maiz A, MD   4 mg at 10/18/23 9076   insulin  aspart (novoLOG ) injection 0-5 Units  0-5 Units Subcutaneous QHS Von Bellis, MD   2 Units at 10/17/23 2152   insulin  aspart (novoLOG ) injection 0-9 Units  0-9 Units Subcutaneous TID WC Von Bellis, MD   5 Units at 10/18/23 9077   insulin  aspart (novoLOG ) injection 4 Units  4 Units Subcutaneous TID WC Von Bellis, MD       NOREEN ON 10/19/2023] insulin  glargine-yfgn (SEMGLEE ) injection 18 Units  18 Units Subcutaneous Q24H Von Bellis, MD       levalbuterol  (XOPENEX )  nebulizer solution 0.63 mg  0.63 mg Nebulization Q6H PRN Thomas, Sara-Maiz A, MD       multivitamin with minerals tablet 1 tablet  1 tablet Oral Daily Von Bellis, MD   1 tablet at 10/18/23 9074   Oral care mouth rinse  15 mL Mouth Rinse 4 times per day Debby Camila LABOR, MD   15 mL at 10/17/23 2153   Oral care mouth rinse  15 mL Mouth Rinse PRN Debby Camila LABOR, MD       sertraline  (ZOLOFT ) tablet 100 mg  100 mg Oral Daily Thomas, Sara-Maiz A, MD   100 mg at 10/18/23 9075   thiamine  (VITAMIN B1) tablet 100 mg  100 mg Oral Daily Von Bellis, MD   100 mg at 10/18/23 9075     Discharge Medications: Please see discharge summary for a list of discharge medications.  Relevant Imaging Results:  Relevant Lab Results:   Additional Information [id number]  [place]  LITTIE [place], LCSW

## 2023-10-18 NOTE — Plan of Care (Signed)
  Problem: Respiratory: Goal: Ability to maintain adequate ventilation will improve Outcome: Progressing Goal: Ability to maintain a clear airway will improve Outcome: Progressing   Problem: Education: Goal: Knowledge of General Education information will improve Description: Including pain rating scale, medication(s)/side effects and non-pharmacologic comfort measures Outcome: Progressing   Problem: Health Behavior/Discharge Planning: Goal: Ability to manage health-related needs will improve Outcome: Progressing   Problem: Coping: Goal: Level of anxiety will decrease Outcome: Progressing   Problem: Elimination: Goal: Will not experience complications related to bowel motility Outcome: Progressing   Problem: Pain Managment: Goal: General experience of comfort will improve and/or be controlled Outcome: Progressing

## 2023-10-19 DIAGNOSIS — E1010 Type 1 diabetes mellitus with ketoacidosis without coma: Secondary | ICD-10-CM | POA: Diagnosis not present

## 2023-10-19 LAB — CBC
HCT: 33.6 % — ABNORMAL LOW (ref 36.0–46.0)
Hemoglobin: 10.9 g/dL — ABNORMAL LOW (ref 12.0–15.0)
MCH: 31.1 pg (ref 26.0–34.0)
MCHC: 32.4 g/dL (ref 30.0–36.0)
MCV: 95.7 fL (ref 80.0–100.0)
Platelets: 154 K/uL (ref 150–400)
RBC: 3.51 MIL/uL — ABNORMAL LOW (ref 3.87–5.11)
RDW: 14.8 % (ref 11.5–15.5)
WBC: 5.1 K/uL (ref 4.0–10.5)
nRBC: 0 % (ref 0.0–0.2)

## 2023-10-19 LAB — GLUCOSE, CAPILLARY
Glucose-Capillary: 261 mg/dL — ABNORMAL HIGH (ref 70–99)
Glucose-Capillary: 345 mg/dL — ABNORMAL HIGH (ref 70–99)
Glucose-Capillary: 231 mg/dL — ABNORMAL HIGH (ref 70–99)
Glucose-Capillary: 169 mg/dL — ABNORMAL HIGH (ref 70–99)

## 2023-10-19 LAB — BASIC METABOLIC PANEL WITH GFR
Anion gap: 7 (ref 5–15)
BUN: 19 mg/dL (ref 8–23)
CO2: 28 mmol/L (ref 22–32)
Calcium: 8.5 mg/dL — ABNORMAL LOW (ref 8.9–10.3)
Chloride: 107 mmol/L (ref 98–111)
Creatinine, Ser: 0.77 mg/dL (ref 0.44–1.00)
GFR, Estimated: >60 mL/min (ref >60)
Glucose, Bld: 223 mg/dL — ABNORMAL HIGH (ref 70–99)
Potassium: 4.2 mmol/L (ref 3.5–5.1)
Sodium: 142 mmol/L (ref 135–145)

## 2023-10-19 LAB — LEGIONELLA PNEUMOPHILA SEROGP 1 UR AG: L. pneumophila Serogp 1 Ur Ag: NEGATIVE

## 2023-10-19 LAB — MAGNESIUM: Magnesium: 2 mg/dL (ref 1.7–2.4)

## 2023-10-19 LAB — PHOSPHORUS: Phosphorus: 2.3 mg/dL — ABNORMAL LOW (ref 2.5–4.6)

## 2023-10-19 MED ORDER — LOSARTAN POTASSIUM 25 MG PO TABS
25.0000 mg | ORAL_TABLET | Freq: Every day | ORAL | Status: DC
  Administered 2023-10-19 – 2023-10-23 (×5): 25 mg via ORAL
  Filled 2023-10-19 (×6): qty 1

## 2023-10-19 MED ORDER — K PHOS MONO-SOD PHOS DI & MONO 155-852-130 MG PO TABS
500.0000 mg | ORAL_TABLET | Freq: Four times a day (QID) | ORAL | Status: AC
  Administered 2023-10-19 (×2): 500 mg via ORAL
  Filled 2023-10-19 (×2): qty 2

## 2023-10-19 MED ORDER — INSULIN GLARGINE-YFGN 100 UNIT/ML ~~LOC~~ SOLN
20.0000 [IU] | SUBCUTANEOUS | Status: DC
  Administered 2023-10-19 – 2023-10-20 (×2): 20 [IU] via SUBCUTANEOUS
  Filled 2023-10-19 (×2): qty 0.2

## 2023-10-19 NOTE — Progress Notes (Signed)
 [facility] Progress Note  Patient: Joyce Brown    FMW:[id number]  DOA: 10/16/2023     Date of Service: the patient was seen and examined on 10/19/2023  Chief Complaint  Patient presents with   Fall   Hyperglycemia   Brief hospital course: Joyce Brown is a 73 y.o. female with medical history significant of DM type 1, Asthma, Cerebellar Ataxia wheel chair bound, fibromyalgia, GERD, HLD, HTN  OSA on CPAP,  remote s/p tx PE, Stiff person syndrome who presents to ED s/p being found down in hotel after sister called EMS for well as patient did not sound her self on phone call. Patient states she was attempting to transfer herself from her bed to chair and fell.  Patient notes prior to this she was at her baseline health although on ros she has noted today she has had increase in sob. She notes he sugar was within range the last time she checked it.  Patient has insulin  pump.  She notes no hx of CVA and no new weakness and noted her speech at times is slurred and her sister corroborates this. Patient on further ros noted no cough/fever/chills /n/v/d/ dysuria or abdominal pain. She notes she is at her baseline strength she does not note new paresthesias. She states current she feels much improved from this am.     ED Course:  Vitals afeb , bp 103/54, hr 70, rr 22, sat 99%  Glucose >600 Lactic 4.1 ,3.9 Beta -hydroxy>8 Na 125, K 6.7, cl 88, bicarb 8, glu 1171, cr 2.28 ( 0.93)  Wbc 12.7, hgb 10.6,  pmn 10.5 Vbg: 7/26 NS  1L EKG: snr  incomplete RBBB CE 98, 114 Latic 3.9 CXR: 1. Question developing right lower lung zone airspace opacity. Recommend further evaluation with chest x-ray PA and lateral view. 2.  Aortic Atherosclerosis (ICD10-I70.0).     Head CT: 1. No evidence of an acute intracranial abnormality. 2. Mild parenchymal atrophy and chronic small vessel ischemic disease.   CT cervical spine:1. No evidence of an acute cervical spine fracture. 2. Mild grade 1  anterolisthesis at C3-C4, C4-C5 and C7-T1. 3. Cervical spondylosis as described.  Tx insulin  drip , cefepime ,vanc ,     Assessment and Plan:  # Type1 DM with DKA S/p insulin  IV infusion as per DKA protocol. Anion gap closed, acidosis resolved Transition to subcu insulin  8/6 increased Semglee  20 units daily, NovoLog  4 units 3 times daily and sliding scale Monitor CBG, continue carb modified diet S/p IV fluid, continue oral hydration  # Suspected pneumonia, ruled out CT chest negative for pneumonia RVP negative Leukocytosis and tachypnea could be secondary to DKA S/p azithromycin  and ceftriaxone  DC antibiotics and monitor    # Elevated troponin, most likely due to demand ischemia Patient denied any chest pain TTE LVEF 65 to 70%, no WMA, no significant valvular abnormality.  Negative PFO   # AKI, due to dehydration secondary to DKA.  Resolved Creatinine improved s/p IV fluid sCr 2.08>> 1.37>>0.92 Monitor renal functions daily  # Hypophosphatemia due to nutritional deficiency. Phos repleted.  Monitor electrolytes and replete as needed.    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 daily Monitor BP and titrate medications accordingly    # OSA: on CPAP  # Iron  deficiency, TSAT 8% Venofer  300 mg IV x 1 dose given, start oral iron  supplement on discharge.  Follow-up with PCP to repeat iron  profile after 3 to 6 months.   Body mass index is 20.24 kg/m.  Interventions:  Diet: Carb modified diet DVT  Prophylaxis: Subcutaneous Lovenox    Advance goals of care discussion: Full code  Family Communication: family was not present at bedside, at the time of interview.  The pt provided permission to discuss medical plan with the family. Opportunity was given to ask question and all questions were answered satisfactorily.   Disposition:  Pt is from home, lives in [place], admitted with DKA, s/p DKA protocol.  Currently stable to discharge awaiting for SNF placement. Follow TOC for SNF placement  Subjective: No significant events overnight.  Patient was feeling a little jittery otherwise no new active issues.  Denied any chest pain or palpitation, no shortness of breath.  Physical Exam: General: NAD, lying comfortably Appear in no distress, affect appropriate Eyes: PERRLA ENT: Oral Mucosa Clear, moist  Neck: no JVD,  Cardiovascular: S1 and S2 Present, no Murmur,  Respiratory: good respiratory effort, Bilateral Air entry equal and Decreased, no Crackles, no wheezes Abdomen: Bowel Sound present, Soft and no tenderness,  Skin: no rashes Extremities: no Pedal edema, no calf tenderness Neurologic: without any new focal findings, chronic cerebellar ataxia and dysarthria. Gait not checked due to patient safety concerns  Vitals:   10/18/23 1615 10/18/23 2058 10/19/23 0221 10/19/23 0721  BP: (!) 138/59 136/73 137/66 (!) 145/67  Pulse: 65 66 64 63  Resp: 18 16 20 16   Temp: 98.1 F (36.7 C) 98.2 F (36.8 C) 97.9 F (36.6 C) 98.2 F (36.8 C)  TempSrc:  Oral Oral   SpO2: 100% 100% 100% 100%  Weight:      Height:        Intake/Output Summary (Last 24 hours) at 10/19/2023 1404 Last data filed at 10/19/2023 0900 Gross per 24 hour  Intake 840 ml  Output 2000 ml  Net -1160 ml   Filed Weights   10/16/23 1243 10/17/23 0400 10/18/23 0350  Weight: 78.9 kg 66.6 kg 64 kg    Data Reviewed: I have personally reviewed and interpreted daily labs, tele strips, imagings as discussed above. I  reviewed all nursing notes, pharmacy notes, vitals, pertinent old records I have discussed plan of care as described above with RN and patient/family.  CBC: Recent Labs  Lab 10/16/23 1245 10/17/23 0134 10/18/23 0411 10/19/23 0520  WBC 12.7* 12.9* 7.9 5.1  NEUTROABS 10.5*  --   --   --   HGB 10.6* 9.6* 9.6* 10.9*  HCT 37.6 28.4* 29.0* 33.6*  MCV 111.9* 92.5 94.8 95.7  PLT 204 169 160 154   Basic Metabolic Panel: Recent Labs  Lab 10/17/23 0828 10/17/23 1045 10/17/23 1305 10/18/23 0411 10/19/23 0520  NA 137 139 138 141 142  K 4.5 4.5 4.5 4.1 4.2  CL 108 106 107 109 107  CO2 24 24 23 24 28   GLUCOSE 143* 186* 217* 211* 223*  BUN 41* 39* 39* 30* 19  CREATININE 1.48* 1.39* 1.37* 0.92 0.77  CALCIUM  8.5* 8.6* 8.5* 8.0* 8.5*  MG  --  2.3  --  2.2 2.0  PHOS  --  2.3*  --  2.8 2.3*    Studies: No results found.   Scheduled Meds:  vitamin C   250 mg Oral BID   aspirin  EC  81 mg Oral Daily   atorvastatin   80 mg Oral Daily   enoxaparin  (LOVENOX ) injection  40 mg Subcutaneous QPM   feeding supplement (GLUCERNA SHAKE)  237 mL Oral TID BM   fesoterodine   4 mg Oral Daily   gabapentin   200 mg Oral TID   insulin  aspart  0-5 Units Subcutaneous QHS   insulin  aspart  0-9 Units Subcutaneous TID WC   insulin  aspart  4 Units Subcutaneous TID WC   insulin  glargine-yfgn  20 Units Subcutaneous Q24H   multivitamin with minerals  1 tablet Oral Daily   mouth rinse  15 mL Mouth Rinse 4 times per day   sertraline   100 mg Oral Daily   thiamine   100 mg Oral Daily   Continuous Infusions:   PRN Meds: acetaminophen , dextrose , docusate sodium , levalbuterol , mouth rinse, tiZANidine   Time spent: 40 minutes  Author: ELVAN SOR. MD [facility] 10/19/2023 2:04 PM  To reach On-call, see care teams to locate the attending and reach out to them via [email or web address]. If 7PM-7AM, please contact night-coverage If you still have difficulty reaching the attending provider, please page the [facility]  (Director on Call) for [facility] on amion for assistance.

## 2023-10-19 NOTE — Progress Notes (Signed)
 Inpatient Rehab Admissions Coordinator:   I spoke to patient's sister on phone regarding recommendations for post acute rehab and current rapid progression in acute setting (she has progressed from mod assist to no physical assist since Monday).  Judi prefers discharge directly to [facility] when ready.  She has confirmed with their manager, Sherlean, that pt's deposit has room held and he can do intake paperwork with [facility] when ready.  I've passed along to [facility] for f/u.  I will sign off.   Reche Lowers, PT, DPT Admissions Coordinator [phone] 10/19/23  1:50 PM

## 2023-10-19 NOTE — Progress Notes (Signed)
 Occupational Therapy Treatment Patient Details Name: Joyce Brown MRN: [id number] DOB: 1950/05/05 Today's Date: 10/19/2023   History of present illness Pt is a 73 y.o. female with medical history significant of DM type 1, Asthma, Cerebellar Ataxia wheel chair bound, fibromyalgia, GERD, HLD, HTN  OSA on CPAP,  remote s/p tx PE, Stiff person syndrome who presents to ED s/p being found down in hotel after sister called EMS, noted for hyperglycemia upon arrival.   OT comments  Ms Terra was seen for OT treatment on this date. Upon arrival to room pt seated in chair, agreeable to tx. Pt requires CGA + RW for simulated BSC t/f, MOD A for simulated toilet t/f, difficulty with turns and backwards steps. MIN A don /doff underwear. Pt making good progress toward goals, will continue to follow POC. Discharge recommendation remains appropriate.        If plan is discharge home, recommend the following:  A lot of help with walking and/or transfers;A lot of help with bathing/dressing/bathroom;Assistance with cooking/housework   Equipment Recommendations  BSC/3in1    Recommendations for Other Services      Precautions / Restrictions Precautions Precautions: Fall Recall of Precautions/Restrictions: Intact Restrictions Weight Bearing Restrictions Per Provider Order: No       Mobility Bed Mobility Overal bed mobility: Modified Independent                  Transfers Overall transfer level: Needs assistance Equipment used: Rolling walker (2 wheels) Transfers: Sit to/from Stand, Bed to chair/wheelchair/BSC Sit to Stand: Contact guard assist   Squat pivot transfers: Min assist             Balance Overall balance assessment: Needs assistance Sitting-balance support: Feet supported Sitting balance-Leahy Scale: Normal     Standing balance support: Single extremity supported, During functional activity Standing balance-Leahy Scale: Fair                              ADL either performed or assessed with clinical judgement   ADL Overall ADL's : Needs assistance/impaired                                       General ADL Comments: CGA + RW for simulated BSC t/f, MOD A for simulated toilet t/f, difficulty with turns and backwards steps. MIN A don annamary underwear    Extremity/Trunk Assessment              Occupational psychologist Communication: No apparent difficulties   Cognition Arousal: Alert Behavior During Therapy: WFL for tasks assessed/performed Cognition: No apparent impairments                               Following commands: Intact        Cueing   Cueing Techniques: Verbal cues  Exercises      Shoulder Instructions       General Comments      Pertinent Vitals/ Pain       Pain Assessment Pain Assessment: No/denies pain  Home Living Family/patient expects to be discharged to:: Assisted living  Home Equipment: Wheelchair - manual          Prior Functioning/Environment              Frequency  Min 2X/week        Progress Toward Goals  OT Goals(current goals can now be found in the care plan section)  Progress towards OT goals: Progressing toward goals  Acute Rehab OT Goals OT Goal Formulation: With patient Time For Goal Achievement: 10/31/23 Potential to Achieve Goals: Good ADL Goals Pt Will Perform Grooming: with modified independence;sitting Pt Will Perform Lower Body Dressing: with modified independence;with adaptive equipment;sit to/from stand Pt Will Transfer to Toilet: with modified independence;squat pivot transfer;bedside commode  Plan      Co-evaluation                 AM-PAC OT 6 Clicks Daily Activity     Outcome Measure   Help from another person eating meals?: None Help from another person taking care of personal grooming?: A Little Help from another  person toileting, which includes using toliet, bedpan, or urinal?: A Lot Help from another person bathing (including washing, rinsing, drying)?: A Lot Help from another person to put on and taking off regular upper body clothing?: A Little Help from another person to put on and taking off regular lower body clothing?: A Lot 6 Click Score: 16    End of Session Equipment Utilized During Treatment: Rolling walker (2 wheels)  OT Visit Diagnosis: Other abnormalities of gait and mobility (R26.89);Muscle weakness (generalized) (M62.81)   Activity Tolerance Patient tolerated treatment well   Patient Left in bed;with call bell/phone within reach;with bed alarm set   Nurse Communication          Time: 8655-8597 OT Time Calculation (min): 18 min  Charges: OT General Charges $OT Visit: 1 Visit OT Treatments $Self Care/Home Management : 8-22 mins  Elston Slot, M.S. OTR/L  10/19/23, 3:52 PM  ascom [phone]

## 2023-10-19 NOTE — Plan of Care (Signed)
  Problem: Activity: Goal: Ability to tolerate increased activity will improve Outcome: Progressing   Problem: Respiratory: Goal: Ability to maintain adequate ventilation will improve Outcome: Progressing   Problem: Pain Managment: Goal: General experience of comfort will improve and/or be controlled Outcome: Progressing

## 2023-10-19 NOTE — Progress Notes (Signed)
 Physical Therapy Treatment Patient Details Name: Joyce Brown MRN: [id number] DOB: [birth year] Today's Date: 10/19/2023   History of Present Illness Pt is a 73 y.o. female with medical history significant of DM type 1, Asthma, Cerebellar Ataxia wheel chair bound, fibromyalgia, GERD, HLD, HTN  OSA on CPAP,  remote s/p tx PE, Stiff person syndrome who presents to ED s/p being found down in hotel after sister called EMS, noted for hyperglycemia upon arrival.    PT Comments  Patient received in bed, she is pleasant and agreeable to PT session. Patient is mod I with bed mobility. Use of bed rails and increased time. Patient is able to stand from bed with min A. Posterior leaning initially with feet sliding out. Once balanced she is able to step pivot to recliner with RW, cga. Patient performed STS x 3 from recliner with shoes donned which improved stability with transfers. Patient will continue to benefit from skilled PT to improve safety and independence.       If plan is discharge home, recommend the following: A little help with walking and/or transfers;A little help with bathing/dressing/bathroom;Assist for transportation   Can travel by private vehicle     No  Equipment Recommendations  Rolling walker (2 wheels)    Recommendations for Other Services       Precautions / Restrictions Precautions Precautions: Fall Recall of Precautions/Restrictions: Intact Restrictions Weight Bearing Restrictions Per Provider Order: No     Mobility  Bed Mobility Overal bed mobility: Independent Bed Mobility: Supine to Sit     Supine to sit: Modified independent (Device/Increase time)     General bed mobility comments: patient mod independent, used bed rails, no physical assist needed    Transfers Overall transfer level: Needs assistance Equipment used: Rolling walker (2 wheels) Transfers: Sit to/from Stand, Bed to chair/wheelchair/BSC Sit to Stand: Min assist   Step pivot transfers:  Contact guard assist       General transfer comment: assist for lift off, performed transfers with less assistance when shoes donned.    Ambulation/Gait Ambulation/Gait assistance: Contact guard assist Gait Distance (Feet): 3 Feet Assistive device: Rolling walker (2 wheels) Gait Pattern/deviations: Step-to pattern, Decreased step length - right, Decreased step length - left, Decreased stride length Gait velocity: decr     General Gait Details: patient is able to step pivot to recliner cga with RW   Stairs             Wheelchair Mobility     Tilt Bed    Modified Rankin (Stroke Patients Only)       Balance Overall balance assessment: Needs assistance Sitting-balance support: Feet supported Sitting balance-Leahy Scale: Normal     Standing balance support: Bilateral upper extremity supported, During functional activity Standing balance-Leahy Scale: Good                              Communication Communication Communication: No apparent difficulties  Cognition Arousal: Alert Behavior During Therapy: WFL for tasks assessed/performed   PT - Cognitive impairments: No apparent impairments                         Following commands: Intact      Cueing Cueing Techniques: Verbal cues  Exercises Other Exercises Other Exercises: LAQ, sit to stand x 3    General Comments        Pertinent Vitals/Pain Pain Assessment Pain Assessment: No/denies pain  Home Living                          Prior Function            PT Goals (current goals can now be found in the care plan section) Acute Rehab PT Goals Patient Stated Goal: to get better, move to Assisted Living PT Goal Formulation: With patient Time For Goal Achievement: 10/31/23 Potential to Achieve Goals: Good Progress towards PT goals: Progressing toward goals    Frequency    Min 3X/week      PT Plan      Co-evaluation              AM-PAC PT 6  Clicks Mobility   Outcome Measure  Help needed turning from your back to your side while in a flat bed without using bedrails?: A Little Help needed moving from lying on your back to sitting on the side of a flat bed without using bedrails?: A Little Help needed moving to and from a bed to a chair (including a wheelchair)?: A Little Help needed standing up from a chair using your arms (e.g., wheelchair or bedside chair)?: A Little Help needed to walk in hospital room?: Total Help needed climbing 3-5 steps with a railing? : Total 6 Click Score: 14    End of Session   Activity Tolerance: Patient tolerated treatment well Patient left: in chair;with call bell/phone within reach;with chair alarm set Nurse Communication: Mobility status PT Visit Diagnosis: Other abnormalities of gait and mobility (R26.89);Muscle weakness (generalized) (M62.81)     Time: 9148-9092 PT Time Calculation (min) (ACUTE ONLY): 16 min  Charges:    $Therapeutic Activity: 8-22 mins PT General Charges $$ ACUTE PT VISIT: 1 Visit                     Joyce Brown, PT, GCS 10/19/23,9:47 AM

## 2023-10-19 NOTE — Inpatient Diabetes Management (Signed)
 Inpatient Diabetes Program Recommendations  AACE/ADA: New Consensus Statement on Inpatient Glycemic Control   Target Ranges:  Prepandial:   less than 140 mg/dL      Peak postprandial:   less than 180 mg/dL (1-2 hours)      Critically ill patients:  140 - 180 mg/dL    Latest Reference Range & Units 10/18/23 07:26 10/18/23 11:34 10/18/23 16:14 10/18/23 20:53 10/19/23 07:21  Glucose-Capillary 70 - 99 mg/dL 734 (H) 702 (H) 675 (H) 219 (H) 261 (H)   Review of Glycemic Control  Diabetes history: DM1 Outpatient Diabetes medications: OmniPod Insulin  Pump with Novolog  and Dexcom G7 CGM; when not on pump Tresiba  18 units daily, Novolog  1 unit per 10 grams of carbs, Novolog  1 unit per 50 mg/dl over target glucose Current orders for Inpatient glycemic control: Semglee  18 units Q24H, Novolog  4 units TID with meals, Novolog  0-9 units TID with meals, Novolog  0-5 units QHS   Inpatient Diabetes Program Recommendations:     Insulin : Patient received Semglee  15 units on 8/5 and ordered 18 units today.  No meal coverage given with breakfast or lunch on 8/5 (per chart did not eat meal).   Please consider increasing Semglee  to 20 units Q24H.    Outpatient DM: Patient states she would prefer to use SQ insulin  injections at discharge. If she is discharged back home or to ALF, she will need Rx for insulin  syringes [phone]).   Thanks, Earnie Gainer, RN, MSN, CDCES Diabetes Coordinator Inpatient Diabetes Program [phone] (Team Pager from 8am to 5pm)

## 2023-10-19 NOTE — Plan of Care (Signed)
  Problem: Activity: Goal: Ability to tolerate increased activity will improve Outcome: Progressing   Problem: Clinical Measurements: Goal: Ability to maintain a body temperature in the normal range will improve Outcome: Progressing   Problem: Education: Goal: Knowledge of General Education information will improve Description: Including pain rating scale, medication(s)/side effects and non-pharmacologic comfort measures Outcome: Progressing   Problem: Health Behavior/Discharge Planning: Goal: Ability to manage health-related needs will improve Outcome: Progressing   Problem: Nutrition: Goal: Adequate nutrition will be maintained Outcome: Progressing   Problem: Coping: Goal: Level of anxiety will decrease Outcome: Progressing   Problem: Elimination: Goal: Will not experience complications related to bowel motility Outcome: Progressing Goal: Will not experience complications related to urinary retention Outcome: Progressing   Problem: Pain Managment: Goal: General experience of comfort will improve and/or be controlled Outcome: Progressing

## 2023-10-19 NOTE — Progress Notes (Signed)
 Inpatient Rehab Admissions Coordinator:   Left message for pt's sister to discuss rehab options.  Will follow.   Reche Lowers, PT, DPT Admissions Coordinator [phone] 10/19/23  1:02 PM

## 2023-10-20 DIAGNOSIS — E1010 Type 1 diabetes mellitus with ketoacidosis without coma: Secondary | ICD-10-CM | POA: Diagnosis not present

## 2023-10-20 LAB — GLUCOSE, CAPILLARY
Glucose-Capillary: 148 mg/dL — ABNORMAL HIGH (ref 70–99)
Glucose-Capillary: 303 mg/dL — ABNORMAL HIGH (ref 70–99)
Glucose-Capillary: 110 mg/dL — ABNORMAL HIGH (ref 70–99)
Glucose-Capillary: 69 mg/dL — ABNORMAL LOW (ref 70–99)
Glucose-Capillary: 285 mg/dL — ABNORMAL HIGH (ref 70–99)

## 2023-10-20 LAB — CBC
HCT: 36.8 % (ref 36.0–46.0)
Hemoglobin: 12.4 g/dL (ref 12.0–15.0)
MCH: 32 pg (ref 26.0–34.0)
MCHC: 33.7 g/dL (ref 30.0–36.0)
MCV: 95.1 fL (ref 80.0–100.0)
Platelets: 165 K/uL (ref 150–400)
RBC: 3.87 MIL/uL (ref 3.87–5.11)
RDW: 14.7 % (ref 11.5–15.5)
WBC: 6.4 K/uL (ref 4.0–10.5)
nRBC: 0 % (ref 0.0–0.2)

## 2023-10-20 LAB — BASIC METABOLIC PANEL WITH GFR
Anion gap: 5 (ref 5–15)
BUN: 17 mg/dL (ref 8–23)
CO2: 30 mmol/L (ref 22–32)
Calcium: 8.2 mg/dL — ABNORMAL LOW (ref 8.9–10.3)
Chloride: 107 mmol/L (ref 98–111)
Creatinine, Ser: 0.59 mg/dL (ref 0.44–1.00)
GFR, Estimated: >60 mL/min (ref >60)
Glucose, Bld: 157 mg/dL — ABNORMAL HIGH (ref 70–99)
Potassium: 3.8 mmol/L (ref 3.5–5.1)
Sodium: 142 mmol/L (ref 135–145)

## 2023-10-20 LAB — PHOSPHORUS: Phosphorus: 2.6 mg/dL (ref 2.5–4.6)

## 2023-10-20 LAB — MAGNESIUM: Magnesium: 2.1 mg/dL (ref 1.7–2.4)

## 2023-10-20 MED ORDER — INSULIN GLARGINE-YFGN 100 UNIT/ML ~~LOC~~ SOLN
25.0000 [IU] | SUBCUTANEOUS | Status: DC
  Administered 2023-10-21: 25 [IU] via SUBCUTANEOUS
  Filled 2023-10-20 (×2): qty 0.25

## 2023-10-20 MED ORDER — INSULIN ASPART 100 UNIT/ML IJ SOLN
6.0000 [IU] | Freq: Three times a day (TID) | INTRAMUSCULAR | Status: DC
  Administered 2023-10-21 – 2023-10-23 (×7): 6 [IU] via SUBCUTANEOUS
  Filled 2023-10-20 (×7): qty 1

## 2023-10-20 NOTE — Plan of Care (Signed)
  Problem: Activity: Goal: Ability to tolerate increased activity will improve Outcome: Progressing   Problem: Pain Managment: Goal: General experience of comfort will improve and/or be controlled Outcome: Progressing   Problem: Safety: Goal: Ability to remain free from injury will improve Outcome: Progressing   Problem: Skin Integrity: Goal: Risk for impaired skin integrity will decrease Outcome: Progressing

## 2023-10-20 NOTE — Progress Notes (Signed)
 Occupational Therapy Treatment Patient Details Name: Joyce Brown MRN: [id number] DOB: [birth year] Today's Date: 10/20/2023   History of present illness Pt is a 73 y.o. female with medical history significant of DM type 1, Asthma, Cerebellar Ataxia wheel chair bound, fibromyalgia, GERD, HLD, HTN  OSA on CPAP,  remote s/p tx PE, Stiff person syndrome who presents to ED s/p being found down in hotel after sister called EMS, noted for hyperglycemia upon arrival.   OT comments  Pt is supine in bed on arrival. Easily arousable and agreeable to OT session. She denies pain. Pt performed bed mobility with MOD I this date, although increased time and effort needed. Pt required Min A for LB dressing, able to don shoes seated EOB with setup assist, required Min A to don/doff mesh underwear d/t being soiled. Purewick and gown changed. Pt performed multiple STS trials from EOB with CGA to RW for clothing management and standing oral care with constant unilateral support on sink to maintain balance. Cues required for upright posture and notable weight bearing through forearm. Pt wishing to attempt short distance ambulation around the bed, however became fatigued very quickly requiring Max A to safely return to EOB to prevent fall. Constant Min A was provided during gait d/t ataxia. Pt reports her arms were giving out. Pt with no injuries and left in sidelying to prevent further skin breakdown to sore area on bottom. She remains a high fall risk. Pt returned to bed with all needs in place and will cont to require skilled acute OT services to maximize her safety and IND to return to PLOF.       If plan is discharge home, recommend the following:  A lot of help with walking and/or transfers;A lot of help with bathing/dressing/bathroom;Assistance with cooking/housework   Equipment Recommendations  BSC/3in1    Recommendations for Other Services      Precautions / Restrictions Precautions Precautions:  Fall Recall of Precautions/Restrictions: Intact Restrictions Weight Bearing Restrictions Per Provider Order: No       Mobility Bed Mobility Overal bed mobility: Modified Independent             General bed mobility comments: increased time/effort and use of bed features to reach EOB without physical assist and return to supine    Transfers Overall transfer level: Needs assistance Equipment used: Rolling walker (2 wheels) Transfers: Sit to/from Stand Sit to Stand: Contact guard assist           General transfer comment: 2-3 STS performed during session from EOB to RW with CGA to manage clothing and perform standing oral care; attempted short distance ambluation in room ~5 ft with pt fatiguing very quickly requiring Max A to safely return to EOB to prevent fall     Balance Overall balance assessment: Needs assistance Sitting-balance support: Feet supported Sitting balance-Leahy Scale: Good     Standing balance support: Single extremity supported, During functional activity Standing balance-Leahy Scale: Fair Standing balance comment: requires unilateral support on sink at all times to stand and perform oral care                           ADL either performed or assessed with clinical judgement   ADL Overall ADL's : Needs assistance/impaired     Grooming: Oral care;Standing;Contact guard assist;Cueing for safety Grooming Details (indicate cue type and reason): cueing for upright posture to push up through BUEs at sink as she tends to lean forward  on forearms             Lower Body Dressing: Contact guard assist;Sitting/lateral leans;Sit to/from stand Lower Body Dressing Details (indicate cue type and reason): to don bil shoes and don/doff mesh underwear             Functional mobility during ADLs: Minimal assistance;Rolling walker (2 wheels)      Extremity/Trunk Assessment              Vision       Perception     Praxis      Communication Communication Communication: No apparent difficulties   Cognition Arousal: Alert Behavior During Therapy: WFL for tasks assessed/performed Cognition: No apparent impairments                               Following commands: Intact        Cueing   Cueing Techniques: Verbal cues  Exercises Other Exercises Other Exercises: Edu on pacing, incorporating rest breaks to prevent increased fatigue and weakness to prevent falls.    Shoulder Instructions       General Comments      Pertinent Vitals/ Pain       Pain Assessment Pain Assessment: No/denies pain  Home Living                                          Prior Functioning/Environment              Frequency  Min 2X/week        Progress Toward Goals  OT Goals(current goals can now be found in the care plan section)  Progress towards OT goals: Progressing toward goals  Acute Rehab OT Goals Patient Stated Goal: improve strength OT Goal Formulation: With patient Time For Goal Achievement: 10/31/23 Potential to Achieve Goals: Good  Plan      Co-evaluation                 AM-PAC OT 6 Clicks Daily Activity     Outcome Measure   Help from another person eating meals?: None Help from another person taking care of personal grooming?: A Little Help from another person toileting, which includes using toliet, bedpan, or urinal?: A Lot Help from another person bathing (including washing, rinsing, drying)?: A Lot Help from another person to put on and taking off regular upper body clothing?: A Little Help from another person to put on and taking off regular lower body clothing?: A Little 6 Click Score: 17    End of Session Equipment Utilized During Treatment: Rolling walker (2 wheels);Gait belt  OT Visit Diagnosis: Other abnormalities of gait and mobility (R26.89);Muscle weakness (generalized) (M62.81)   Activity Tolerance Patient tolerated treatment well    Patient Left in bed;with call bell/phone within reach;with bed alarm set   Nurse Communication Mobility status        Time: 9143-9073 OT Time Calculation (min): 30 min  Charges: OT General Charges $OT Visit: 1 Visit OT Treatments $Self Care/Home Management : 23-37 mins  Scharlene Catalina, OTR/L  10/20/23, 10:51 AM   Duwaine FORBES Saupe 10/20/2023, 10:48 AM

## 2023-10-20 NOTE — Plan of Care (Signed)
  Problem: Clinical Measurements: Goal: Ability to maintain a body temperature in the normal range will improve Outcome: Progressing   Problem: Clinical Measurements: Goal: Respiratory complications will improve Outcome: Progressing   Problem: Elimination: Goal: Will not experience complications related to urinary retention Outcome: Progressing   Problem: Safety: Goal: Ability to remain free from injury will improve Outcome: Progressing

## 2023-10-20 NOTE — Progress Notes (Signed)
 [facility] Progress Note  Patient: Joyce Brown    FMW:[id number]  DOA: 10/16/2023     Date of Service: the patient was seen and examined on 10/20/2023  Chief Complaint  Patient presents with   Fall   Hyperglycemia   Brief hospital course: Dinora Hemm is a 73 y.o. female with medical history significant of DM type 1, Asthma, Cerebellar Ataxia wheel chair bound, fibromyalgia, GERD, HLD, HTN  OSA on CPAP,  remote s/p tx PE, Stiff person syndrome who presents to ED s/p being found down in hotel after sister called EMS for well as patient did not sound her self on phone call. Patient states she was attempting to transfer herself from her bed to chair and fell.  Patient notes prior to this she was at her baseline health although on ros she has noted today she has had increase in sob. She notes he sugar was within range the last time she checked it.  Patient has insulin  pump.  She notes no hx of CVA and no new weakness and noted her speech at times is slurred and her sister corroborates this. Patient on further ros noted no cough/fever/chills /n/v/d/ dysuria or abdominal pain. She notes she is at her baseline strength she does not note new paresthesias. She states current she feels much improved from this am.     ED Course:  Vitals afeb , bp 103/54, hr 70, rr 22, sat 99%  Glucose >600 Lactic 4.1 ,3.9 Beta -hydroxy>8 Na 125, K 6.7, cl 88, bicarb 8, glu 1171, cr 2.28 ( 0.93)  Wbc 12.7, hgb 10.6,  pmn 10.5 Vbg: 7/26 NS  1L EKG: snr  incomplete RBBB CE 98, 114 Latic 3.9 CXR: 1. Question developing right lower lung zone airspace opacity. Recommend further evaluation with chest x-ray PA and lateral view. 2.  Aortic Atherosclerosis (ICD10-I70.0).     Head CT: 1. No evidence of an acute intracranial abnormality. 2. Mild parenchymal atrophy and chronic small vessel ischemic disease.   CT cervical spine:1. No evidence of an acute cervical spine fracture. 2. Mild grade 1  anterolisthesis at C3-C4, C4-C5 and C7-T1. 3. Cervical spondylosis as described.  Tx insulin  drip , cefepime ,vanc ,     Assessment and Plan:  # Type1 DM with DKA S/p insulin  IV infusion as per DKA protocol. Anion gap closed, acidosis resolved Transition to subcu insulin  8/7 increased Semglee  25 units daily, NovoLog  6 units 3 times daily and sliding scale Monitor CBG, continue carb modified diet S/p IV fluid, continue oral hydration  # Suspected pneumonia, ruled out CT chest negative for pneumonia RVP negative Leukocytosis and tachypnea could be secondary to DKA S/p azithromycin  and ceftriaxone  DC antibiotics and monitor    # Elevated troponin, most likely due to demand ischemia Patient denied any chest pain TTE LVEF 65 to 70%, no WMA, no significant valvular abnormality.  Negative PFO   # AKI, due to dehydration secondary to DKA.  Resolved Creatinine improved s/p IV fluid sCr 2.08>> 1.37>>0.92 Monitor renal functions daily  # Hypophosphatemia due to nutritional deficiency. Phos repleted.  Monitor electrolytes and replete as needed.    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 daily Monitor BP and titrate medications accordingly    # OSA: on CPAP  # Iron  deficiency, Tsat 8% Venofer  300 mg IV x 1 dose given, start oral iron  supplement on discharge.  Follow-up with PCP to repeat iron  profile after 3 to 6 months.   Body mass index is 20.24 kg/m.  Interventions:  Diet: Carb modified diet DVT  Prophylaxis: Subcutaneous Lovenox    Advance goals of care discussion: Full code  Family Communication: family was not present at bedside, at the time of interview.  The pt provided permission to discuss medical plan with the family. Opportunity was given to ask question and all questions were answered satisfactorily.   Disposition:  Pt is from home, lives in [place], admitted with DKA, s/p DKA protocol.  Currently stable to discharge awaiting for SNF placement. Follow TOC for SNF placement  Subjective: No significant events overnight.  Patient was resting calmly, denied any active issues.  Patient is aware that she is awaiting for SNF placement.  Physical Exam: General: NAD, lying comfortably Appear in no distress, affect appropriate Eyes: PERRLA ENT: Oral Mucosa Clear, moist  Neck: no JVD,  Cardiovascular: S1 and S2 Present, no Murmur,  Respiratory: good respiratory effort, Bilateral Air entry equal and Decreased, no Crackles, no wheezes Abdomen: Bowel Sound present, Soft and no tenderness,  Skin: no rashes Extremities: no Pedal edema, no calf tenderness Neurologic: without any new focal findings, chronic cerebellar ataxia and dysarthria. Gait not checked due to patient safety concerns  Vitals:   10/19/23 1459 10/19/23 1954 10/20/23 0246 10/20/23 0718  BP: 127/60 124/63 124/66 129/72  Pulse: 67 67 65 (!) 58  Resp: 16 18 18 18   Temp: 98.7 F (37.1 C) 98.2 F (36.8 C) 98.1 F (36.7 C) 97.6 F (36.4 C)  TempSrc: Oral Oral    SpO2: 100% 99% 98% 100%  Weight:      Height:        Intake/Output Summary (Last 24 hours) at 10/20/2023 1502 Last data filed at 10/20/2023 1300 Gross per 24 hour  Intake 360 ml  Output 1925 ml  Net -1565 ml   Filed Weights   10/16/23 1243 10/17/23 0400 10/18/23 0350  Weight: 78.9 kg 66.6 kg 64 kg    Data Reviewed: I have personally reviewed and interpreted daily labs, tele strips, imagings as discussed above. I reviewed all nursing notes,  pharmacy notes, vitals, pertinent old records I have discussed plan of care as described above with RN and patient/family.  CBC: Recent Labs  Lab 10/16/23 1245 10/17/23 0134 10/18/23 0411 10/19/23 0520 10/20/23 0515  WBC 12.7* 12.9* 7.9 5.1 6.4  NEUTROABS 10.5*  --   --   --   --   HGB 10.6* 9.6* 9.6* 10.9* 12.4  HCT 37.6 28.4* 29.0* 33.6* 36.8  MCV 111.9* 92.5 94.8 95.7 95.1  PLT 204 169 160 154 165   Basic Metabolic Panel: Recent Labs  Lab 10/17/23 1045 10/17/23 1305 10/18/23 0411 10/19/23 0520 10/20/23 0515  NA 139 138 141 142 142  K 4.5 4.5 4.1 4.2 3.8  CL 106 107 109 107 107  CO2 24 23 24 28 30   GLUCOSE 186* 217* 211* 223* 157*  BUN 39* 39* 30* 19 17  CREATININE 1.39* 1.37* 0.92 0.77 0.59  CALCIUM  8.6* 8.5*  8.0* 8.5* 8.2*  MG 2.3  --  2.2 2.0 2.1  PHOS 2.3*  --  2.8 2.3* 2.6    Studies: No results found.   Scheduled Meds:  vitamin C   250 mg Oral BID   aspirin  EC  81 mg Oral Daily   atorvastatin   80 mg Oral Daily   enoxaparin  (LOVENOX ) injection  40 mg Subcutaneous QPM   feeding supplement (GLUCERNA SHAKE)  237 mL Oral TID BM   fesoterodine   4 mg Oral Daily   gabapentin   200 mg Oral TID   insulin  aspart  0-5 Units Subcutaneous QHS   insulin  aspart  0-9 Units Subcutaneous TID WC   insulin  aspart  4 Units Subcutaneous TID WC   insulin  glargine-yfgn  20 Units Subcutaneous Q24H   losartan   25 mg Oral Daily   multivitamin with minerals  1 tablet Oral Daily   mouth rinse  15 mL Mouth Rinse 4 times per day   sertraline   100 mg Oral Daily   thiamine   100 mg Oral Daily   Continuous Infusions:   PRN Meds: acetaminophen , dextrose , docusate sodium , levalbuterol , mouth rinse, tiZANidine   Time spent: 38 minutes  Author: ELVAN SOR. MD [facility] 10/20/2023 3:02 PM  To reach On-call, see care teams to locate the attending and reach out to them via [email or web address]. If 7PM-7AM, please contact night-coverage If you still have difficulty reaching the  attending provider, please page the [facility] (Director on Call) for [facility] on amion for assistance.

## 2023-10-20 NOTE — Inpatient Diabetes Management (Signed)
 Inpatient Diabetes Program Recommendations  AACE/ADA: New Consensus Statement on Inpatient Glycemic Control  Target Ranges:  Prepandial:   less than 140 mg/dL      Peak postprandial:   less than 180 mg/dL (1-2 hours)      Critically ill patients:  140 - 180 mg/dL    Latest Reference Range & Units 10/20/23 05:15  Glucose 70 - 99 mg/dL 842 (H)    Latest Reference Range & Units 10/19/23 07:21 10/19/23 11:37 10/19/23 16:33 10/19/23 20:51  Glucose-Capillary 70 - 99 mg/dL 738 (H) 654 (H) 768 (H) 169 (H)   Review of Glycemic Control  Diabetes history: DM1 Outpatient Diabetes medications: OmniPod Insulin  Pump with Novolog  and Dexcom G7 CGM; when not on pump Tresiba  18 units daily, Novolog  1 unit per 10 grams of carbs, Novolog  1 unit per 50 mg/dl over target glucose Current orders for Inpatient glycemic control: Semglee  25 units Q24H, Novolog  4 units TID with meals, Novolog  0-9 units TID with meals, Novolog  0-5 units QHS   Inpatient Diabetes Program Recommendations:     Insulin : If post prandial glucose remains consistently over 180 mg/dl, may want to consider increasing meal coverage to Novolog  6 units TID.   Outpatient DM: Patient states she would prefer to use SQ insulin  injections at discharge. If she is discharged back home or to ALF, she will need Rx for insulin  syringes [phone]).   Thanks, Earnie Gainer, RN, MSN, CDCES Diabetes Coordinator Inpatient Diabetes Program [phone] (Team Pager from 8am to 5pm)

## 2023-10-20 NOTE — Progress Notes (Addendum)
 Physical Therapy Treatment Patient Details Name: Joyce Brown MRN: [id number] DOB: 02/08/1951 Today's Date: 10/20/2023   History of Present Illness Pt is a 73 y.o. female with medical history significant of DM type 1, Asthma, Cerebellar Ataxia wheel chair bound, fibromyalgia, GERD, HLD, HTN  OSA on CPAP,  remote s/p tx PE, Stiff person syndrome who presents to ED s/p being found down in hotel after sister called EMS, noted for hyperglycemia upon arrival.    PT Comments  Patient received in bed, she reports she tried to walk this am and it did not go well. Patient is agreeable to getting up to recliner. Patient is mod I with bed mobility. cgA for sit to stand, and cga for ambulation for 5 feet with RW from bed to recliner. Seated  LE exercises performed with poor coordination/control. STS x 5 reps with cga. She will continue to benefit from skilled PT to improve safety with mobility and increased LE strengthening/coordination.    If plan is discharge home, recommend the following: A lot of help with walking and/or transfers;Assist for transportation;A little help with bathing/dressing/bathroom   Can travel by private vehicle      Yes?  Equipment Recommendations  Rolling walker (2 wheels)    Recommendations for Other Services       Precautions / Restrictions Precautions Precautions: Fall Recall of Precautions/Restrictions: Impaired Restrictions Weight Bearing Restrictions Per Provider Order: No     Mobility  Bed Mobility Overal bed mobility: Modified Independent Bed Mobility: Supine to Sit     Supine to sit: Modified independent (Device/Increase time)          Transfers Overall transfer level: Needs assistance Equipment used: Rolling walker (2 wheels) Transfers: Sit to/from Stand Sit to Stand: Contact guard assist                Ambulation/Gait Ambulation/Gait assistance: Contact guard assist Gait Distance (Feet): 5 Feet Assistive device: Rolling walker (2  wheels) Gait Pattern/deviations: Step-to pattern, Decreased step length - right, Decreased step length - left, Decreased stride length Gait velocity: decr     General Gait Details: patient ambulated ~5 feet from bed to recliner. Poor LE coordination and strength.   Stairs             Wheelchair Mobility     Tilt Bed    Modified Rankin (Stroke Patients Only)       Balance Overall balance assessment: Needs assistance Sitting-balance support: Feet supported Sitting balance-Leahy Scale: Good     Standing balance support: Bilateral upper extremity supported, During functional activity, Reliant on assistive device for balance Standing balance-Leahy Scale: Fair                              Hotel manager: No apparent difficulties  Cognition Arousal: Alert Behavior During Therapy: WFL for tasks assessed/performed   PT - Cognitive impairments: No apparent impairments                         Following commands: Intact      Cueing Cueing Techniques: Verbal cues  Exercises Other Exercises Other Exercises: LAQ in recliner, marching x 10 reps bilaterally, STS x5 reps    General Comments        Pertinent Vitals/Pain Pain Assessment Pain Assessment: No/denies pain    Home Living  Prior Function            PT Goals (current goals can now be found in the care plan section) Acute Rehab PT Goals Patient Stated Goal: to get better, move to Assisted Living PT Goal Formulation: With patient Time For Goal Achievement: 10/31/23 Potential to Achieve Goals: Good Progress towards PT goals: Progressing toward goals    Frequency    Min 3X/week      PT Plan      Co-evaluation              AM-PAC PT 6 Clicks Mobility   Outcome Measure  Help needed turning from your back to your side while in a flat bed without using bedrails?: A Little Help needed moving from lying on  your back to sitting on the side of a flat bed without using bedrails?: A Little Help needed moving to and from a bed to a chair (including a wheelchair)?: A Little Help needed standing up from a chair using your arms (e.g., wheelchair or bedside chair)?: A Little Help needed to walk in hospital room?: A Lot Help needed climbing 3-5 steps with a railing? : Total 6 Click Score: 15    End of Session Equipment Utilized During Treatment: Gait belt Activity Tolerance: Patient tolerated treatment well Patient left: in chair;with call bell/phone within reach;with chair alarm set Nurse Communication: Mobility status PT Visit Diagnosis: Other abnormalities of gait and mobility (R26.89);Muscle weakness (generalized) (M62.81);Difficulty in walking, not elsewhere classified (R26.2)     Time: 8654-8645 PT Time Calculation (min) (ACUTE ONLY): 9 min  Charges:    $Therapeutic Activity: 8-22 mins PT General Charges $$ ACUTE PT VISIT: 1 Visit                     Guerline Happ, PT, GCS 10/20/23,2:02 PM

## 2023-10-21 DIAGNOSIS — E1010 Type 1 diabetes mellitus with ketoacidosis without coma: Secondary | ICD-10-CM | POA: Diagnosis not present

## 2023-10-21 LAB — BASIC METABOLIC PANEL WITH GFR
Anion gap: 6 (ref 5–15)
BUN: 18 mg/dL (ref 8–23)
CO2: 30 mmol/L (ref 22–32)
Calcium: 8.4 mg/dL — ABNORMAL LOW (ref 8.9–10.3)
Chloride: 104 mmol/L (ref 98–111)
Creatinine, Ser: 0.76 mg/dL (ref 0.44–1.00)
GFR, Estimated: >60 mL/min (ref >60)
Glucose, Bld: 220 mg/dL — ABNORMAL HIGH (ref 70–99)
Potassium: 4.6 mmol/L (ref 3.5–5.1)
Sodium: 140 mmol/L (ref 135–145)

## 2023-10-21 LAB — CBC
HCT: 37.8 % (ref 36.0–46.0)
Hemoglobin: 12.3 g/dL (ref 12.0–15.0)
MCH: 31.4 pg (ref 26.0–34.0)
MCHC: 32.5 g/dL (ref 30.0–36.0)
MCV: 96.4 fL (ref 80.0–100.0)
Platelets: 190 K/uL (ref 150–400)
RBC: 3.92 MIL/uL (ref 3.87–5.11)
RDW: 14.8 % (ref 11.5–15.5)
WBC: 6.9 K/uL (ref 4.0–10.5)
nRBC: 0 % (ref 0.0–0.2)

## 2023-10-21 LAB — CULTURE, BLOOD (SINGLE)
Culture: NO GROWTH
Culture: NO GROWTH
Special Requests: ADEQUATE

## 2023-10-21 LAB — GLUCOSE, CAPILLARY
Glucose-Capillary: 267 mg/dL — ABNORMAL HIGH (ref 70–99)
Glucose-Capillary: 144 mg/dL — ABNORMAL HIGH (ref 70–99)
Glucose-Capillary: 118 mg/dL — ABNORMAL HIGH (ref 70–99)
Glucose-Capillary: 95 mg/dL (ref 70–99)

## 2023-10-21 MED ORDER — CALCIUM CARBONATE ANTACID 500 MG PO CHEW
1.0000 | CHEWABLE_TABLET | Freq: Three times a day (TID) | ORAL | Status: DC
  Administered 2023-10-21 – 2023-10-26 (×23): 200 mg via ORAL
  Filled 2023-10-21 (×16): qty 1

## 2023-10-21 MED ORDER — ADULT MULTIVITAMIN W/MINERALS CH
1.0000 | ORAL_TABLET | Freq: Two times a day (BID) | ORAL | Status: DC
  Administered 2023-10-21 – 2023-10-26 (×15): 1 via ORAL
  Filled 2023-10-21 (×10): qty 1

## 2023-10-21 NOTE — Plan of Care (Signed)
  Problem: Activity: Goal: Ability to tolerate increased activity will improve Outcome: Progressing   Problem: Pain Managment: Goal: General experience of comfort will improve and/or be controlled Outcome: Progressing   Problem: Safety: Goal: Ability to remain free from injury will improve Outcome: Progressing   Problem: Skin Integrity: Goal: Risk for impaired skin integrity will decrease Outcome: Progressing

## 2023-10-21 NOTE — Progress Notes (Signed)
 Physical Therapy Treatment Patient Details Name: Joyce Brown MRN: [id number] DOB: [birth year] Today's Date: 10/21/2023   History of Present Illness Pt is a 73 y.o. female with medical history significant of DM type 1, Asthma, Cerebellar Ataxia wheel chair bound, fibromyalgia, GERD, HLD, HTN  OSA on CPAP,  remote s/p tx PE, Stiff person syndrome who presents to ED s/p being found down in hotel after sister called EMS, noted for hyperglycemia upon arrival.    PT Comments  Patient is agreeable to PT session. She was able to stand x 2 bouts with emphasis on standing tolerance for strengthening. LE exercises performed from seated position. Patient is hopeful for transition to ALF soon. PT will continue to follow to maximize independence.    If plan is discharge home, recommend the following: A lot of help with walking and/or transfers;Assist for transportation;A little help with bathing/dressing/bathroom   Can travel by private vehicle     No  Equipment Recommendations  Rolling walker (2 wheels)    Recommendations for Other Services       Precautions / Restrictions Precautions Precautions: Fall Recall of Precautions/Restrictions: Impaired Restrictions Weight Bearing Restrictions Per Provider Order: No     Mobility  Bed Mobility               General bed mobility comments: not assessed as patient sitting up on arrival and post session    Transfers Overall transfer level: Needs assistance Equipment used: Rolling walker (2 wheels) Transfers: Sit to/from Stand Sit to Stand: Contact guard assist           General transfer comment: 2 standing bouts from recliner chair with reinforcement of safe technique. no physical lifting assistance required    Ambulation/Gait             Pre-gait activities: emphasis on increasing standing tolerance for LE strengthening     Stairs             Wheelchair Mobility     Tilt Bed    Modified Rankin (Stroke  Patients Only)       Balance Overall balance assessment: Needs assistance Sitting-balance support: Feet supported Sitting balance-Leahy Scale: Good Sitting balance - Comments: patient able to reach to left and right in chair past midline without loss of balance   Standing balance support: Bilateral upper extremity supported, During functional activity, Reliant on assistive device for balance Standing balance-Leahy Scale: Fair Standing balance comment: at least unilateral UE support required                            Communication Communication Communication: No apparent difficulties  Cognition Arousal: Alert Behavior During Therapy: WFL for tasks assessed/performed   PT - Cognitive impairments: No apparent impairments                         Following commands: Intact      Cueing Cueing Techniques: Verbal cues  Exercises General Exercises - Lower Extremity Long Arc Quad: AROM, Strengthening, Left, Seated (3 reps) Straight Leg Raises: AROM, Strengthening, Both, Seated (3 reps) Other Exercises Other Exercises: encouraged LE exercise for strengthening    General Comments        Pertinent Vitals/Pain Pain Assessment Pain Assessment: No/denies pain    Home Living                          Prior Function  PT Goals (current goals can now be found in the care plan section) Acute Rehab PT Goals Patient Stated Goal: to get better, move to Assisted Living PT Goal Formulation: With patient Time For Goal Achievement: 10/31/23 Potential to Achieve Goals: Good Progress towards PT goals: Progressing toward goals    Frequency    Min 3X/week      PT Plan      Co-evaluation              AM-PAC PT 6 Clicks Mobility   Outcome Measure  Help needed turning from your back to your side while in a flat bed without using bedrails?: A Little Help needed moving from lying on your back to sitting on the side of a flat bed  without using bedrails?: A Little Help needed moving to and from a bed to a chair (including a wheelchair)?: A Little Help needed standing up from a chair using your arms (e.g., wheelchair or bedside chair)?: A Little Help needed to walk in hospital room?: A Lot Help needed climbing 3-5 steps with a railing? : Total 6 Click Score: 15    End of Session   Activity Tolerance: Patient tolerated treatment well Patient left: in chair;with call bell/phone within reach;with chair alarm set Nurse Communication: Mobility status PT Visit Diagnosis: Other abnormalities of gait and mobility (R26.89);Muscle weakness (generalized) (M62.81);Difficulty in walking, not elsewhere classified (R26.2)     Time: 8885-8868 PT Time Calculation (min) (ACUTE ONLY): 17 min  Charges:    $Therapeutic Activity: 8-22 mins PT General Charges $$ ACUTE PT VISIT: 1 Visit                     Randine Essex, PT, MPT   Randine LULLA Essex 10/21/2023, 11:39 AM

## 2023-10-21 NOTE — Progress Notes (Signed)
 Initial Nutrition Assessment  DOCUMENTATION CODES:   Non-severe (moderate) malnutrition in context of chronic illness  INTERVENTION:   -Continue carb modified diet -MVI with minerals BID secondary to history of roux en y gastric bypass -Continue 250 mg vitamin C  BID -Continue 100 mg thiamine  daily x 7 days -D/c Glucerna Shake po TID, each supplement provides 220 kcal and 10 grams of protein  -500 mg calcium  carbonate TID -Magic cup BID with meals, each supplement provides 290 kcal and 9 grams of protein   NUTRITION DIAGNOSIS:   Moderate Malnutrition related to chronic illness as evidenced by moderate fat depletion, moderate muscle depletion, percent weight loss.  Ongoing  GOAL:   Patient will meet greater than or equal to 90% of their needs  Progressing   MONITOR:   PO intake, Supplement acceptance, Labs, Weight trends, Skin, I & O's  REASON FOR ASSESSMENT:   Malnutrition Screening Tool    ASSESSMENT:   73 y/o female with h/o stiff person syndrome, chronic pain, Type I DM (positive anti-GAD antibodies diagnosed 2007), anxiety, depression, fibromyalgia, HTN, OSA, GERD, HLD, roux-en-y gastric bypass (2014) and SBO s/p laparoscopic lysis of adhesions and reduction of internal hernia (2015) who is admitted with DKA and AKI.  Reviewed I/O's: 3240 ml x 24 hours and 351 ml since admission  UOP: 600 ml x 24 hours   Pt sleeping soundly at time of visit. Noted pt consumed 100% of breakfast tray. Meal completions 100%.  No wt loss since last visit.   Per TOC notes, plan for SNF placement at discharge.   Medications reviewed and include vitamin C , neurontin , and thiamine .   Labs reviewed: CBGS: 69-267 (inpatient orders for glycemic control are 0-5 units insulin  aspart daily at bedtime, 0-9 units insulin  aspart TID with meals, 6 units insulin  aspart TID with meals, and 25 units insulin  glargine-yfgn daily). Calcium : 8.4,  Iron : 16, Vitamin B-12: 3163, Folate, vitamin D  WDL.  Zinc , copper , and vitamins A, E, and K pending.    Diet Order:   Diet Order             Diet Carb Modified Fluid consistency: Thin; Room service appropriate? Yes  Diet effective now                   EDUCATION NEEDS:   Education needs have been addressed  Skin:  Skin Assessment: Skin Integrity Issues: Skin Integrity Issues:: Stage I Stage I: rt buttocks  Last BM:  10/18/23  Height:   Ht Readings from Last 1 Encounters:  10/16/23 5' 10 (1.778 m)    Weight:   Wt Readings from Last 1 Encounters:  10/21/23 74.6 kg    Ideal Body Weight:  68 kg  BMI:  Body mass index is 23.6 kg/m.  Estimated Nutritional Needs:   Kcal:  1800-2100kcal/day  Protein:  90-105g/day  Fluid:  1.7-2.0L/day    [name] ORN, RD, LDN, CDCES Registered Dietitian III Certified Diabetes Care and Education Specialist If unable to reach this RD, please use RD Inpatient group chat on secure chat between hours of 8am-4 pm daily

## 2023-10-21 NOTE — Progress Notes (Signed)
 Occupational Therapy Treatment Patient Details Name: Joyce Brown MRN: [id number] DOB: 03-27-1950 Today's Date: 10/21/2023   History of present illness Pt is a 73 y.o. female with medical history significant of DM type 1, Asthma, Cerebellar Ataxia wheel chair bound, fibromyalgia, GERD, HLD, HTN  OSA on CPAP,  remote s/p tx PE, Stiff person syndrome who presents to ED s/p being found down in hotel after sister called EMS, noted for hyperglycemia upon arrival.   OT comments  Joyce Brown was seen for OT treatment on this date. Upon arrival to room pt in bed, agreeable to tx. Pt requires MOD I don B shoes in sitting. CGA + RW for bed>chair t/f. Pt making good progress toward goals, will continue to follow POC. Discharge recommendation updated to reflect progress.        If plan is discharge home, recommend the following:  A lot of help with walking and/or transfers;A lot of help with bathing/dressing/bathroom;Assistance with cooking/housework   Equipment Recommendations  BSC/3in1    Recommendations for Other Services      Precautions / Restrictions Precautions Precautions: Fall Recall of Precautions/Restrictions: Impaired Restrictions Weight Bearing Restrictions Per Provider Order: No       Mobility Bed Mobility Overal bed mobility: Modified Independent                  Transfers Overall transfer level: Needs assistance Equipment used: Rolling walker (2 wheels) Transfers: Sit to/from Stand Sit to Stand: Contact guard assist                 Balance Overall balance assessment: Needs assistance Sitting-balance support: Feet supported Sitting balance-Leahy Scale: Good     Standing balance support: Single extremity supported Standing balance-Leahy Scale: Fair                             ADL either performed or assessed with clinical judgement   ADL Overall ADL's : Needs assistance/impaired                                        General ADL Comments: MOD I don B shoes in sitting. CGA + RW for simulated BSC t/f.    Extremity/Trunk Assessment              Vision       Restaurant manager, fast food Communication: No apparent difficulties   Cognition Arousal: Alert Behavior During Therapy: WFL for tasks assessed/performed Cognition: No apparent impairments                               Following commands: Intact        Cueing   Cueing Techniques: Verbal cues  Exercises      Shoulder Instructions       General Comments      Pertinent Vitals/ Pain       Pain Assessment Pain Assessment: No/denies pain  Home Living                                          Prior Functioning/Environment              Frequency  Min 2X/week        Progress Toward Goals  OT Goals(current goals can now be found in the care plan section)  Progress towards OT goals: Progressing toward goals  Acute Rehab OT Goals OT Goal Formulation: With patient Time For Goal Achievement: 10/31/23 Potential to Achieve Goals: Good ADL Goals Pt Will Perform Grooming: with modified independence;sitting Pt Will Perform Lower Body Dressing: with modified independence;with adaptive equipment;sit to/from stand Pt Will Transfer to Toilet: with modified independence;squat pivot transfer;bedside commode  Plan      Co-evaluation                 AM-PAC OT 6 Clicks Daily Activity     Outcome Measure   Help from another person eating meals?: None Help from another person taking care of personal grooming?: A Little Help from another person toileting, which includes using toliet, bedpan, or urinal?: A Lot Help from another person bathing (including washing, rinsing, drying)?: A Lot Help from another person to put on and taking off regular upper body clothing?: A Little Help from another person to put on and taking off regular lower body clothing?: A  Little 6 Click Score: 17    End of Session    OT Visit Diagnosis: Other abnormalities of gait and mobility (R26.89);Muscle weakness (generalized) (M62.81)   Activity Tolerance Patient tolerated treatment well   Patient Left in chair;with call bell/phone within reach (PT in room)   Nurse Communication          Time: 8892-8883 OT Time Calculation (min): 9 min  Charges: OT General Charges $OT Visit: 1 Visit OT Treatments $Self Care/Home Management : 8-22 mins  Joyce Brown, M.S. OTR/L  10/21/23, 12:56 PM  ascom [phone]

## 2023-10-21 NOTE — Inpatient Diabetes Management (Addendum)
 Inpatient Diabetes Program Recommendations  AACE/ADA: New Consensus Statement on Inpatient Glycemic Control   Target Ranges:  Prepandial:   less than 140 mg/dL      Peak postprandial:   less than 180 mg/dL (1-2 hours)      Critically ill patients:  140 - 180 mg/dL    Latest Reference Range & Units 10/21/23 02:19  Glucose 70 - 99 mg/dL 779 (H)    Latest Reference Range & Units 10/20/23 07:19 10/20/23 9:31 10/20/23 11:15 10/20/23 16:10 10/20/23 16:27 10/20/23 21:13  Glucose-Capillary 70 - 99 mg/dL 851 (H)   Novolog  1 unit @9 :31  Semglee  20 units @9 :31  Novolog  4 units @9 :40 303 (H)  Novolog  11 units @11 :47 69 (L) 110 (H) 285 (H)  Novolog  3 units   Review of Glycemic Control  Diabetes history: DM1 Outpatient Diabetes medications: OmniPod Insulin  Pump with Novolog  and Dexcom G7 CGM; when not on pump Tresiba  18 units daily, Novolog  1 unit per 10 grams of carbs, Novolog  1 unit per 50 mg/dl over target glucose Current orders for Inpatient glycemic control: Semglee  25 units Q24H, Novolog  6 units TID with meals, Novolog  0-9 units TID with meals, Novolog  0-5 units QHS   Inpatient Diabetes Program Recommendations:     Insulin :  CBG down to 69 mg/dl at 83:89 as a result of insulin  stacking (8:00 am dose of Novolog  correction given at 9:31 and 8 am meal coverage given at 9:40; then 12:00 Novolog  correction and meal coverage given at 11:47) since Novolog  correction and meal coverage insulin  was given within about 2 hours of each other.   Outpatient DM: Patient states she would prefer to use SQ insulin  injections at discharge. If she is discharged back home or to ALF, she will need Rx for insulin  syringes [phone]).   Thanks, Earnie Gainer, RN, MSN, CDCES Diabetes Coordinator Inpatient Diabetes Program [phone] (Team Pager from 8am to 5pm)

## 2023-10-21 NOTE — Progress Notes (Signed)
 [facility] Progress Note  Patient: Joyce Brown    FMW:[id number]  DOA: 10/16/2023     Date of Service: the patient was seen and examined on 10/21/2023  Chief Complaint  Patient presents with   Fall   Hyperglycemia   Brief hospital course: Joyce Brown is a 73 y.o. female with medical history significant of DM type 1, Asthma, Cerebellar Ataxia wheel chair bound, fibromyalgia, GERD, HLD, HTN  OSA on CPAP,  remote s/p tx PE, Stiff person syndrome who presents to ED s/p being found down in hotel after sister called EMS for well as patient did not sound her self on phone call. Patient states she was attempting to transfer herself from her bed to chair and fell.  Patient notes prior to this she was at her baseline health although on ros she has noted today she has had increase in sob. She notes he sugar was within range the last time she checked it.  Patient has insulin  pump.  She notes no hx of CVA and no new weakness and noted her speech at times is slurred and her sister corroborates this. Patient on further ros noted no cough/fever/chills /n/v/d/ dysuria or abdominal pain. She notes she is at her baseline strength she does not note new paresthesias. She states current she feels much improved from this am.     ED Course:  Vitals afeb , bp 103/54, hr 70, rr 22, sat 99%  Glucose >600 Lactic 4.1 ,3.9 Beta -hydroxy>8 Na 125, K 6.7, cl 88, bicarb 8, glu 1171, cr 2.28 ( 0.93)  Wbc 12.7, hgb 10.6,  pmn 10.5 Vbg: 7/26 NS  1L EKG: snr  incomplete RBBB CE 98, 114 Latic 3.9 CXR: 1. Question developing right lower lung zone airspace opacity. Recommend further evaluation with chest x-ray PA and lateral view. 2.  Aortic Atherosclerosis (ICD10-I70.0).     Head CT: 1. No evidence of an acute intracranial abnormality. 2. Mild parenchymal atrophy and chronic small vessel ischemic disease.   CT cervical spine:1. No evidence of an acute cervical spine fracture. 2. Mild grade 1  anterolisthesis at C3-C4, C4-C5 and C7-T1. 3. Cervical spondylosis as described.  Tx insulin  drip , cefepime ,vanc ,     Assessment and Plan:  # Type1 DM with DKA S/p insulin  IV infusion as per DKA protocol. Anion gap closed, acidosis resolved Transition to subcu insulin  8/7 increased Semglee  25 units daily, NovoLog  6 units 3 times daily and sliding scale Monitor CBG, continue carb modified diet S/p IV fluid, continue oral hydration  # Suspected pneumonia, ruled out CT chest negative for pneumonia RVP negative Leukocytosis and tachypnea could be secondary to DKA S/p azithromycin  and ceftriaxone  DC antibiotics and monitor    # Elevated troponin, most likely due to demand ischemia Patient denied any chest pain TTE LVEF 65 to 70%, no WMA, no significant valvular abnormality.  Negative PFO   # AKI, due to dehydration secondary to DKA.  Resolved Creatinine improved s/p IV fluid sCr 2.08>> 1.37>>0.92 Monitor renal functions daily  # Hypophosphatemia due to nutritional deficiency. Phos repleted.  Monitor electrolytes and replete as needed.    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 daily Monitor BP and titrate medications accordingly    # OSA: on CPAP  # Iron  deficiency, Tsat 8% Venofer  300 mg IV x 1 dose given, start oral iron  supplement on discharge.  Follow-up with PCP to repeat iron  profile after 3 to 6 months.   Body mass index is 23.6 kg/m.  Interventions:  Diet: Carb modified diet DVT Prophylaxis:  Subcutaneous Lovenox    Advance goals of care discussion: Full code  Family Communication: family was not present at bedside, at the time of interview.  The pt provided permission to discuss medical plan with the family. Opportunity was given to ask question and all questions were answered satisfactorily.   Disposition:  Pt is from home, lives in [place], admitted with DKA, s/p DKA protocol.  Currently stable to discharge awaiting for SNF placement. Follow TOC for SNF placement  Subjective: No significant events overnight.  Patient was resting comfortably, denied any active issues.  Patient is aware that she is awaiting for SNF placement.  Physical Exam: General: NAD, lying comfortably Appear in no distress, affect appropriate Eyes: PERRLA ENT: Oral Mucosa Clear, moist  Neck: no JVD,  Cardiovascular: S1 and S2 Present, no Murmur,  Respiratory: good respiratory effort, Bilateral Air entry equal and Decreased, no Crackles, no wheezes Abdomen: Bowel Sound present, Soft and no tenderness,  Skin: no rashes Extremities: no Pedal edema, no calf tenderness Neurologic: without any new focal findings, chronic cerebellar ataxia and dysarthria. Gait not checked due to patient safety concerns  Vitals:   10/21/23 0207 10/21/23 0600 10/21/23 0737 10/21/23 1609  BP: 120/60  136/68 (!) 114/55  Pulse: 66  72 67  Resp: 17  16 18   Temp:   97.9 F (36.6 C) 98.5 F (36.9 C)  TempSrc:   Oral   SpO2: 98%  99% 100%  Weight:  74.6 kg    Height:        Intake/Output Summary (Last 24 hours) at 10/21/2023 1722 Last data filed at 10/21/2023 0900 Gross per 24 hour  Intake 240 ml  Output 1350 ml  Net -1110 ml   Filed Weights   10/17/23 0400 10/18/23 0350 10/21/23 0600  Weight: 66.6 kg 64 kg 74.6 kg    Data Reviewed: I have personally reviewed and interpreted daily labs, tele strips, imagings as discussed above. I reviewed all nursing notes, pharmacy notes, vitals, pertinent old records I have  discussed plan of care as described above with RN and patient/family.  CBC: Recent Labs  Lab 10/16/23 1245 10/17/23 0134 10/18/23 0411 10/19/23 0520 10/20/23 0515 10/21/23 0219  WBC 12.7* 12.9* 7.9 5.1 6.4 6.9  NEUTROABS 10.5*  --   --   --   --   --   HGB 10.6* 9.6* 9.6* 10.9* 12.4 12.3  HCT 37.6 28.4* 29.0* 33.6* 36.8 37.8  MCV 111.9* 92.5 94.8 95.7 95.1 96.4  PLT 204 169 160 154 165 190   Basic Metabolic Panel: Recent Labs  Lab 10/17/23 1045 10/17/23 1305 10/18/23 0411 10/19/23 0520 10/20/23 0515 10/21/23 0219  NA 139 138 141 142 142 140  K 4.5 4.5 4.1 4.2 3.8 4.6  CL 106 107 109 107 107 104  CO2 24 23 24 28 30 30   GLUCOSE 186* 217* 211* 223* 157* 220*  BUN 39* 39* 30* 19  17 18  CREATININE 1.39* 1.37* 0.92 0.77 0.59 0.76  CALCIUM  8.6* 8.5* 8.0* 8.5* 8.2* 8.4*  MG 2.3  --  2.2 2.0 2.1  --   PHOS 2.3*  --  2.8 2.3* 2.6  --     Studies: No results found.   Scheduled Meds:  vitamin C   250 mg Oral BID   aspirin  EC  81 mg Oral Daily   atorvastatin   80 mg Oral Daily   calcium  carbonate  1 tablet Oral TID WC   enoxaparin  (LOVENOX ) injection  40 mg Subcutaneous QPM   fesoterodine   4 mg Oral Daily   gabapentin   200 mg Oral TID   insulin  aspart  0-5 Units Subcutaneous QHS   insulin  aspart  0-9 Units Subcutaneous TID WC   insulin  aspart  6 Units Subcutaneous TID WC   insulin  glargine-yfgn  25 Units Subcutaneous Q24H   losartan   25 mg Oral Daily   multivitamin with minerals  1 tablet Oral BID   mouth rinse  15 mL Mouth Rinse 4 times per day   sertraline   100 mg Oral Daily   thiamine   100 mg Oral Daily   Continuous Infusions:   PRN Meds: acetaminophen , dextrose , docusate sodium , levalbuterol , mouth rinse, tiZANidine   Time spent: 35 minutes  Author: ELVAN SOR. MD [facility] 10/21/2023 5:22 PM  To reach On-call, see care teams to locate the attending and reach out to them via [email or web address]. If 7PM-7AM, please contact night-coverage If you still  have difficulty reaching the attending provider, please page the [facility] (Director on Call) for [facility] on amion for assistance.

## 2023-10-22 DIAGNOSIS — N179 Acute kidney failure, unspecified: Secondary | ICD-10-CM | POA: Diagnosis not present

## 2023-10-22 DIAGNOSIS — L8990 Pressure ulcer of unspecified site, unspecified stage: Secondary | ICD-10-CM | POA: Insufficient documentation

## 2023-10-22 DIAGNOSIS — E1010 Type 1 diabetes mellitus with ketoacidosis without coma: Secondary | ICD-10-CM | POA: Diagnosis not present

## 2023-10-22 LAB — CULTURE, BLOOD (ROUTINE X 2)
Culture: NO GROWTH
Culture: NO GROWTH

## 2023-10-22 LAB — BASIC METABOLIC PANEL WITH GFR
Anion gap: 7 (ref 5–15)
BUN: 18 mg/dL (ref 8–23)
CO2: 26 mmol/L (ref 22–32)
Calcium: 8.7 mg/dL — ABNORMAL LOW (ref 8.9–10.3)
Chloride: 107 mmol/L (ref 98–111)
Creatinine, Ser: 0.83 mg/dL (ref 0.44–1.00)
GFR, Estimated: >60 mL/min
Glucose, Bld: 121 mg/dL — ABNORMAL HIGH (ref 70–99)
Potassium: 4.1 mmol/L (ref 3.5–5.1)
Sodium: 140 mmol/L (ref 135–145)

## 2023-10-22 LAB — GLUCOSE, CAPILLARY
Glucose-Capillary: 134 mg/dL — ABNORMAL HIGH (ref 70–99)
Glucose-Capillary: 151 mg/dL — ABNORMAL HIGH (ref 70–99)
Glucose-Capillary: 235 mg/dL — ABNORMAL HIGH (ref 70–99)
Glucose-Capillary: 146 mg/dL — ABNORMAL HIGH (ref 70–99)

## 2023-10-22 LAB — CBC
HCT: 37.9 % (ref 36.0–46.0)
Hemoglobin: 12 g/dL (ref 12.0–15.0)
MCH: 30.8 pg (ref 26.0–34.0)
MCHC: 31.7 g/dL (ref 30.0–36.0)
MCV: 97.2 fL (ref 80.0–100.0)
Platelets: 212 K/uL (ref 150–400)
RBC: 3.9 MIL/uL (ref 3.87–5.11)
RDW: 14.7 % (ref 11.5–15.5)
WBC: 8.3 K/uL (ref 4.0–10.5)
nRBC: 0 % (ref 0.0–0.2)

## 2023-10-22 MED ORDER — SENNOSIDES-DOCUSATE SODIUM 8.6-50 MG PO TABS
2.0000 | ORAL_TABLET | Freq: Two times a day (BID) | ORAL | Status: DC
  Administered 2023-10-22 – 2023-10-26 (×14): 2 via ORAL
  Filled 2023-10-22 (×9): qty 2

## 2023-10-22 MED ORDER — INSULIN GLARGINE-YFGN 100 UNIT/ML ~~LOC~~ SOLN
18.0000 [IU] | SUBCUTANEOUS | Status: DC
  Administered 2023-10-22 – 2023-10-26 (×8): 18 [IU] via SUBCUTANEOUS
  Filled 2023-10-22 (×5): qty 0.18

## 2023-10-22 NOTE — Plan of Care (Signed)

## 2023-10-22 NOTE — TOC CM/SW Note (Addendum)
..  Transition of Care [facility]) - Inpatient Brief Assessment   Patient Details  Name: Joyce Brown MRN: [id number] Date of Birth: 24-Nov-1950  Transition of [facility]) CM/SW Contact:    [name], LCSW Phone Number: 10/22/2023, 10:05 AM   Clinical Narrative:  SW spoke with Dr. Laurita regarding placement to [facility].  SW contacted [facility].  Admission-[facility] is not in today. TOC to follow up Monday  Transition of Care Asessment:

## 2023-10-22 NOTE — Hospital Course (Signed)
 Joyce Brown is a 73 y.o. female with medical history significant of DM type 1, Asthma, Cerebellar Ataxia wheel chair bound, fibromyalgia, GERD, HLD, HTN  OSA on CPAP,  remote s/p tx PE, Stiff person syndrome who presents to ED s/p being found down in hotel after sister called EMS for well as patient did not sound her self on phone call. Patient states she was attempting to transfer herself from her bed to chair and fell.  Upon arriving to hospital, she was in DKA, she was placed on insulin  drip.  Condition has improved since 8/7.  But patient does not have a place to go, currently pending for assisted living transfer.

## 2023-10-22 NOTE — Progress Notes (Signed)
  Progress Note   Patient: Joyce Brown FMW:[id number] DOB: 1950/09/09 DOA: 10/16/2023     6 DOS: the patient was seen and examined on 10/22/2023   Brief hospital course: Camy Leder is a 73 y.o. female with medical history significant of DM type 1, Asthma, Cerebellar Ataxia wheel chair bound, fibromyalgia, GERD, HLD, HTN  OSA on CPAP,  remote s/p tx PE, Stiff person syndrome who presents to ED s/p being found down in hotel after sister called EMS for well as patient did not sound her self on phone call. Patient states she was attempting to transfer herself from her bed to chair and fell.  Upon arriving to hospital, she was in DKA, she was placed on insulin  drip.  Condition has improved since 8/7.  But patient does not have a place to go, currently pending for assisted living transfer.   Active Problems:   DKA (diabetic ketoacidosis) (HCC)   Malnutrition of moderate degree   Assessment and Plan: Type 1 diabetes with DKA. Pneumonia ruled out. Patient was previously on insulin  pump, pump was not functional for 3 days.  He came to the hospital with severe acidosis with elevated anion gap.  She was placed on insulin  drip, was able to transition to subcu insulin  on 8/7. Glucose on the lower side, I will decrease dose of insulin  Semglee   Acute kidney injury secondary to DKA. Hypophosphatemia Hyperkalemia. Elevated troponin secondary to demand ischemia. Condition improved.   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 daily, Bp relatively controlled.     OSA: on CPAP    Iron  deficiency anemia,  Status post IV iron .  Globin normalized       Subjective:  Patient doing well today, he has  no complaints  Physical Exam: Vitals:   10/21/23 1951 10/22/23 0430 10/22/23 0500 10/22/23 0731  BP: 134/60 (!) 146/69  (!) 147/67  Pulse: 76 70  68  Resp: 16 16  16   Temp: 98 F (36.7 C) 98.2 F (36.8 C)  98.6 F (37 C)  TempSrc:      SpO2: 100% 98%  100%  Weight:   74.2 kg   Height:       General exam: Appears calm and comfortable  Respiratory system: Clear to auscultation. Respiratory effort normal. Cardiovascular system: S1 & S2 heard, RRR. No JVD, murmurs, rubs, gallops or clicks. No pedal edema. Gastrointestinal system: Abdomen is nondistended, soft and nontender. No organomegaly or masses felt. Normal bowel sounds heard. Central nervous system: Alert and oriented. No focal neurological deficits. Extremities: Symmetric 5 x 5 power. Skin: No rashes, lesions or ulcers Psychiatry: Judgement and insight appear normal. Mood & affect appropriate.    Data Reviewed:  Lab results reviewed.  Family Communication: None  Disposition: Status is: Inpatient Remains inpatient appropriate because: Unsafe discharge, patient does not have a home to go to.  Insurance has approved for assisted living, most likely will transfer on Monday.     Time spent: 35 minutes  Author: Murvin Mana, MD 10/22/2023 11:41 AM  For on call review [email or web address].

## 2023-10-23 DIAGNOSIS — N179 Acute kidney failure, unspecified: Secondary | ICD-10-CM | POA: Diagnosis not present

## 2023-10-23 DIAGNOSIS — E1010 Type 1 diabetes mellitus with ketoacidosis without coma: Secondary | ICD-10-CM | POA: Diagnosis not present

## 2023-10-23 LAB — GLUCOSE, CAPILLARY
Glucose-Capillary: 102 mg/dL — ABNORMAL HIGH (ref 70–99)
Glucose-Capillary: 327 mg/dL — ABNORMAL HIGH (ref 70–99)
Glucose-Capillary: 202 mg/dL — ABNORMAL HIGH (ref 70–99)
Glucose-Capillary: 148 mg/dL — ABNORMAL HIGH (ref 70–99)

## 2023-10-23 MED ORDER — ORAL CARE MOUTH RINSE
15.0000 mL | OROMUCOSAL | Status: DC | PRN
Start: 2023-10-23 — End: 2023-10-26

## 2023-10-23 MED ORDER — LACTULOSE 10 GM/15ML PO SOLN
20.0000 g | Freq: Once | ORAL | Status: AC
  Administered 2023-10-23: 20 g via ORAL
  Filled 2023-10-23: qty 30

## 2023-10-23 MED ORDER — ENSURE PLUS HIGH PROTEIN PO LIQD: 237.0000 mL | Freq: Two times a day (BID) | ORAL | Status: DC

## 2023-10-23 MED ORDER — INSULIN ASPART 100 UNIT/ML IJ SOLN
4.0000 [IU] | Freq: Three times a day (TID) | INTRAMUSCULAR | Status: DC
  Administered 2023-10-23 – 2023-10-24 (×6): 4 [IU] via SUBCUTANEOUS
  Filled 2023-10-23 (×4): qty 1

## 2023-10-23 NOTE — Plan of Care (Signed)
  Problem: Activity: Goal: Ability to tolerate increased activity will improve Outcome: Progressing   Problem: Education: Goal: Knowledge of General Education information will improve Description: Including pain rating scale, medication(s)/side effects and non-pharmacologic comfort measures Outcome: Progressing   Problem: Clinical Measurements: Goal: Will remain free from infection Outcome: Progressing   Problem: Activity: Goal: Risk for activity intolerance will decrease Outcome: Progressing   Problem: Nutrition: Goal: Adequate nutrition will be maintained Outcome: Progressing   Problem: Elimination: Goal: Will not experience complications related to bowel motility Outcome: Progressing Goal: Will not experience complications related to urinary retention Outcome: Progressing   Problem: Safety: Goal: Ability to remain free from injury will improve Outcome: Progressing   Problem: Skin Integrity: Goal: Risk for impaired skin integrity will decrease Outcome: Progressing

## 2023-10-23 NOTE — Progress Notes (Signed)
 Progress Note   Patient: Joyce Brown FMW:[id number] DOB: 1950-10-30 DOA: 10/16/2023     7 DOS: the patient was seen and examined on 10/23/2023   Brief hospital course: Nolah Krenzer is a 73 y.o. female with medical history significant of DM type 1, Asthma, Cerebellar Ataxia wheel chair bound, fibromyalgia, GERD, HLD, HTN  OSA on CPAP,  remote s/p tx PE, Stiff person syndrome who presents to ED s/p being found down in hotel after sister called EMS for well as patient did not sound her self on phone call. Patient states she was attempting to transfer herself from her bed to chair and fell.  Upon arriving to hospital, she was in DKA, she was placed on insulin  drip.  Condition has improved since 8/7.  But patient does not have a place to go, currently pending for assisted living transfer.   Active Problems:   DKA, type 1, not at goal [facility])   Malnutrition of moderate degree   Pressure injury of skin   AKI (acute kidney injury) (HCC)   Assessment and Plan: Type 1 diabetes with DKA. Pneumonia ruled out. Patient was previously on insulin  pump, pump was not functional for 3 days.  He came to the hospital with severe acidosis with elevated anion gap.  She was placed on insulin  drip, was able to transition to subcu insulin  on 8/7. Glucose on the lower side, I had decreased dose of insulin  Semglee  8/9.  We also decreased dose of NovoLog  scheduled. I have asked patient to bring in her insulin  pump for tomorrow discharge, so that I would not have to prescribe Lantus  which can cause significant confusion in the facility.   Acute kidney injury secondary to DKA. Hypophosphatemia Hyperkalemia. Elevated troponin secondary to demand ischemia. Condition improved.    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     Fibromyalgia Neurontin  and tazanidine restarted   Essential hypertension Patient is not on any medication at home 8/6 started losartan  25 mg p.o. daily, Bp relatively controlled.     OSA: on CPAP    Iron  deficiency anemia,  Status post IV iron .  Hb normalized        Subjective:  Patient doing well today, she has no complaint.  Physical Exam: Vitals:   10/22/23 1933 10/23/23 0423 10/23/23 0500 10/23/23 0750  BP: (!) 104/57 (!) 111/55  (!) 118/59  Pulse: 80 61  63  Resp: 18 18  17   Temp: 98.1 F (36.7 C) (!) 97.1 F (36.2 C)  97.6 F (36.4 C)  TempSrc: Oral   Oral  SpO2: 97% 99%  100%  Weight:   74.1 kg   Height:       General exam: Appears calm and comfortable  Respiratory system: Clear to auscultation. Respiratory effort normal. Cardiovascular system: S1 & S2 heard, RRR. No JVD, murmurs, rubs, gallops or clicks. No pedal edema. Gastrointestinal system: Abdomen is nondistended, soft and nontender. No organomegaly or masses felt. Normal bowel sounds heard. Central nervous system: Alert and oriented. No focal neurological deficits. Extremities: Symmetric 5 x 5 power. Skin: No rashes, lesions or ulcers Psychiatry: Judgement and insight appear normal. Mood & affect appropriate.    Data Reviewed:  Lab results reviewed.  Family Communication: None  Disposition: Status is: Inpatient Remains inpatient appropriate because: Unsafe discharge, pending placement.  Time spent: 35 minutes  Author: Murvin Mana, MD 10/23/2023 11:36 AM  For on call review [email or web address].

## 2023-10-24 DIAGNOSIS — E440 Moderate protein-calorie malnutrition: Secondary | ICD-10-CM | POA: Diagnosis not present

## 2023-10-24 DIAGNOSIS — E1010 Type 1 diabetes mellitus with ketoacidosis without coma: Secondary | ICD-10-CM | POA: Diagnosis not present

## 2023-10-24 LAB — GLUCOSE, CAPILLARY
Glucose-Capillary: 155 mg/dL — ABNORMAL HIGH (ref 70–99)
Glucose-Capillary: 245 mg/dL — ABNORMAL HIGH (ref 70–99)
Glucose-Capillary: 338 mg/dL — ABNORMAL HIGH (ref 70–99)
Glucose-Capillary: 234 mg/dL — ABNORMAL HIGH (ref 70–99)

## 2023-10-24 MED ORDER — INSULIN ASPART 100 UNIT/ML IJ SOLN: 8.0000 [IU] | Freq: Every day | INTRAMUSCULAR | Status: DC

## 2023-10-24 MED ORDER — INSULIN ASPART 100 UNIT/ML IJ SOLN
4.0000 [IU] | Freq: Two times a day (BID) | INTRAMUSCULAR | Status: DC
  Administered 2023-10-24 – 2023-10-25 (×4): 4 [IU] via SUBCUTANEOUS
  Filled 2023-10-24 (×2): qty 1

## 2023-10-24 NOTE — Progress Notes (Signed)
 Physical Therapy Treatment Patient Details Name: Joyce Brown MRN: [id number] DOB: February 01, 1951 Today's Date: 10/24/2023   History of Present Illness Pt is a 73 y.o. female with medical history significant of DM type 1, Asthma, Cerebellar Ataxia wheel chair bound, fibromyalgia, GERD, HLD, HTN  OSA on CPAP,  remote s/p tx PE, Stiff person syndrome who presents to ED s/p being found down in hotel after sister called EMS, noted for hyperglycemia upon arrival.    PT Comments  In chair.  Session focused on functional transfers x 4 and general safety.  Cues for pt to gait balance and stability before stepping.  Voiced understanding.  She relies heavily on RW for support but no LOB or buckling noted.    If plan is discharge home, recommend the following: A lot of help with walking and/or transfers;Assist for transportation;A little help with bathing/dressing/bathroom   Can travel by private vehicle        Equipment Recommendations  Rolling walker (2 wheels)    Recommendations for Other Services       Precautions / Restrictions Precautions Precautions: Fall Recall of Precautions/Restrictions: Intact Restrictions Weight Bearing Restrictions Per Provider Order: No     Mobility  Bed Mobility                 Patient Response: Cooperative  Transfers Overall transfer level: Needs assistance Equipment used: Rolling walker (2 wheels) Transfers: Sit to/from Stand, Bed to chair/wheelchair/BSC Sit to Stand: Contact guard assist, Min assist   Step pivot transfers: Min assist, Contact guard assist       General transfer comment: x 4 chair <->bed transfers during session    Ambulation/Gait Ambulation/Gait assistance: Contact guard assist Gait Distance (Feet): 3 Feet Assistive device: Rolling walker (2 wheels) Gait Pattern/deviations: Step-to pattern, Decreased step length - right, Decreased step length - left, Decreased stride length Gait velocity: decr     General Gait  Details: limited to transfers   Stairs             Wheelchair Mobility     Tilt Bed Tilt Bed Patient Response: Cooperative  Modified Rankin (Stroke Patients Only)       Balance Overall balance assessment: Needs assistance Sitting-balance support: Feet supported Sitting balance-Leahy Scale: Good     Standing balance support: Single extremity supported Standing balance-Leahy Scale: Fair                              Hotel manager: No apparent difficulties  Cognition Arousal: Alert Behavior During Therapy: WFL for tasks assessed/performed   PT - Cognitive impairments: No apparent impairments                       PT - Cognition Comments: pt oriented to self, place, situation (but questionable statements during session pt may flucuate) Following commands: Intact      Cueing Cueing Techniques: Verbal cues  Exercises      General Comments        Pertinent Vitals/Pain Pain Assessment Pain Assessment: No/denies pain    Home Living                          Prior Function            PT Goals (current goals can now be found in the care plan section) Progress towards PT goals: Progressing toward goals    Frequency  Min 3X/week      PT Plan      Co-evaluation              AM-PAC PT 6 Clicks Mobility   Outcome Measure  Help needed turning from your back to your side while in a flat bed without using bedrails?: A Little Help needed moving from lying on your back to sitting on the side of a flat bed without using bedrails?: A Little Help needed moving to and from a bed to a chair (including a wheelchair)?: A Little Help needed standing up from a chair using your arms (e.g., wheelchair or bedside chair)?: A Little Help needed to walk in hospital room?: A Lot Help needed climbing 3-5 steps with a railing? : Total 6 Click Score: 15    End of Session Equipment Utilized During  Treatment: Gait belt Activity Tolerance: Patient tolerated treatment well Patient left: in chair;with call bell/phone within reach;with chair alarm set Nurse Communication: Mobility status PT Visit Diagnosis: Other abnormalities of gait and mobility (R26.89);Muscle weakness (generalized) (M62.81);Difficulty in walking, not elsewhere classified (R26.2)     Time: 8861-8847 PT Time Calculation (min) (ACUTE ONLY): 14 min  Charges:    $Therapeutic Activity: 8-22 mins PT General Charges $$ ACUTE PT VISIT: 1 Visit                   Lauraine Gills, PTA 10/24/23, 1:26 PM

## 2023-10-24 NOTE — Progress Notes (Signed)
 Progress Note   Patient: Joyce Brown FMW:[id number] DOB: December 27, 1950 DOA: 10/16/2023     8 DOS: the patient was seen and examined on 10/24/2023   Brief hospital course: Renee Erb is a 73 y.o. female with medical history significant of DM type 1, Asthma, Cerebellar Ataxia wheel chair bound, fibromyalgia, GERD, HLD, HTN  OSA on CPAP,  remote s/p tx PE, Stiff person syndrome who presents to ED s/p being found down in hotel after sister called EMS for well as patient did not sound her self on phone call. Patient states she was attempting to transfer herself from her bed to chair and fell.  Upon arriving to hospital, she was in DKA, she was placed on insulin  drip.  Condition has improved since 8/7.  But patient does not have a place to go, currently pending for assisted living transfer.   Active Problems:   DKA, type 1, not at goal [facility])   Malnutrition of moderate degree   Pressure injury of skin   AKI (acute kidney injury) (HCC)   Assessment and Plan: Type 1 diabetes with DKA. Pneumonia ruled out. Patient was previously on insulin  pump, pump was not functional for 3 days.  He came to the hospital with severe acidosis with elevated anion gap.  She was placed on insulin  drip, was able to transition to subcu insulin  on 8/7. Glucose on the lower side, I had decreased dose of insulin  Semglee  8/9.  We also decreased dose of NovoLog  scheduled. Based on glucose measurement, I will increase breakfast scheduled NovoLog  for noon hyperglycemia. Patient is still waiting for placement, she states that her insulin  pump may be broken.  Probably will continue insulin  glargine and NovoLog  at discharge.   Acute kidney injury secondary to DKA. Hypophosphatemia Hyperkalemia. Elevated troponin secondary to demand ischemia. Condition improved.    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     Fibromyalgia Neurontin  and tazanidine restarted   Essential hypertension Patient is not on any medication at home 8/6 started losartan  25 mg p.o. daily, Bp relatively controlled.     OSA: on CPAP    Iron  deficiency anemia,  Status post IV iron .  Hb normalized      Subjective:  Patient doing well today, no complaint  Physical Exam: Vitals:   10/23/23 1516 10/23/23 1939 10/24/23 0442 10/24/23 0812  BP: (!) 97/53 (!) 104/54 (!) 109/59 116/61  Pulse: 74 73 70 64  Resp: 17 16 17 18   Temp: 98.5 F (36.9 C) 98.4 F (36.9 C) 98.2 F (36.8 C) 97.8 F (36.6 C)  TempSrc: Oral Oral Oral Oral  SpO2: 98% 99% 100% 100%  Weight:      Height:       General exam: Appears calm and comfortable  Respiratory system: Clear to auscultation. Respiratory effort normal. Cardiovascular system: S1 & S2 heard, RRR. No JVD, murmurs, rubs, gallops or clicks. No pedal edema. Gastrointestinal system: Abdomen is nondistended, soft and nontender. No organomegaly or masses felt. Normal bowel sounds heard. Central nervous system: Alert and oriented. No focal neurological deficits. Extremities: Symmetric 5 x 5 power. Skin: No rashes, lesions or ulcers Psychiatry: Judgement and insight appear normal. Mood & affect appropriate.    Data Reviewed:  Lab results reviewed.  Family Communication: None  Disposition: Status is: Inpatient Remains inpatient appropriate because: Unsafe discharge,  pending placement     Time spent: 35 minutes  Author: Murvin Mana, MD 10/24/2023 2:19 PM  For on call review [email or web address].

## 2023-10-24 NOTE — TOC CM/SW Note (Addendum)
..  Transition of Care [facility]) - Inpatient Brief Assessment   Patient Details  Name: Joyce Brown MRN: [id number] Date of Birth: [birth year]  Transition of Care [facility]) CM/SW Contact:    [name], LCSW Phone Number: 10/24/2023, 11:46 AM   Clinical Narrative:  SW reached out to [employer] 2x, no response.  SW spoke with pt sister - Joyce Brown.  Judi expressed that pt was living at [facility] and moved into [place] until placement at [facility].  Deposit has been paid to [facility] since June 2025.  Judi stated she has spoken with Joyce about placement and he indicated paperwork needed to be completed.  SW expressed that we have submitted paperwork on our end.  Judi called [facility] again today and spoke with nursing staff to contact SW.    2:09pm  SW received message from team member- Antion, that Joyce Brown from [facility] will be coming to see pt today around 3pm.  SW notified nursing staff to let [facility] staff know when [name] arrives.  SW contacted MD to review and sign FL2.  SW faxed FL2 to [phone] and resubmitted FL2 through [employer]  4:13pm SW met with pt at bedside.  Joyce Brown with [facility] was present as well.  Pt is to discharge Wednesday, August 13. Between 10am-1030am or 1pm-130pm. Pt will need assistance with transportation as well. Joyce Brown info [phone] and [email or web address]  Transition of Care Asessment:

## 2023-10-24 NOTE — NC FL2 (Addendum)
 [place]  MEDICAID [id number] LEVEL OF CARE FORM     IDENTIFICATION  Patient Name: Joyce Brown Birthdate: March 06, 1951 Sex: female Admission Date (Current Location): 10/16/2023  [facility] and [place] Number:  Chiropodist and Address:  [facility], [address], [place], [place] [address]      Provider Number: [id number]  Attending Physician Name and Address:  Laurita Pillion, MD  Relative Name and Phone Number:  Carle Kid [phone]    Current Level of Care: Hospital Recommended Level of Care: Assisted Living Facility Prior Approval Number:    Date Approved/Denied:   PASRR Number: [id number]  Discharge Plan: Other (Comment) (ALF)    Current Diagnoses: Patient Active Problem List   Diagnosis Date Noted   Pressure injury of skin 10/22/2023   AKI (acute kidney injury) (HCC) 10/22/2023   Malnutrition of moderate degree 10/18/2023   DKA, type 1, not at goal [facility]) 10/16/2023   Age-related osteoporosis without current pathological fracture 09/21/2023   Diplopia 06/01/2023   Aortic atherosclerosis (HCC) 04/19/2023   HTN (hypertension) 04/11/2023   Asthma 04/11/2023   Polyp of cecum 03/24/2023   Cervical radicular pain 03/02/2023   Insulin  pump status 01/26/2023   Encounter for annual wellness visit (AWV) in Medicare patient 01/11/2023   Cervicalgia 12/09/2022   Arthritis of carpometacarpal [facility]) joint of both thumbs 12/09/2022   Healthcare maintenance 11/10/2022   Facial rash 08/17/2022   Rectal prolapse 08/17/2022   Dry eye 08/12/2022   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   Bilateral tennis elbow 06/15/2021   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   Type 1 diabetes mellitus with diabetic polyneuropathy (HCC) 12/25/2020   Diabetic neuropathy (HCC) 12/09/2020   Myofascial pain 09/12/2020   Wheelchair dependence 09/12/2020   Lumbar facet arthropathy 04/27/2020   Bilateral impacted cerumen 08/21/2019   Sensorineural hearing loss  (SNHL) of both ears 08/21/2019   End of life care 08/15/2019   Tinnitus, bilateral 06/27/2019   Breast calcifications 12/28/2017   Hypokalemia 12/06/2017   Cerebellar ataxia (HCC)    Vitamin B6 deficiency 11/10/2017   Gastric bypass status for obesity 10/20/2016   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   Status post bariatric surgery 10/20/2016   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) 06/20/2016   Depression 06/16/2015   Hyperparathyroidism (HCC) 10/29/2014   Type 1 diabetes mellitus without complication (HCC) 10/01/2014   Urge incontinence of urine 05/28/2014   Dyslipidemia 02/19/2014   Spondylosis without myelopathy or radiculopathy, lumbar region 12/17/2013   Fibromyalgia 03/26/2013   Impaired mobility and activities of daily living 11/30/2012   Lichen planus 10/30/2012   Spondylosis of cervical spine 05/16/2012   Adjustment disorder with mixed anxiety and depressed mood 05/11/2012   Spinal stenosis of lumbar region 04/28/2012   Degeneration of lumbar intervertebral disc 04/26/2012   Osteoarthritis of hand 05/24/2011    Orientation RESPIRATION BLADDER Height & Weight     Self, Time, Situation  Other (Comment) (clear to auscultation bilaterally, no wheezing, no crackles. Normal respiratory effort. No accessory muscle use.  CPAP) External catheter Weight: 163 lb 5.8 oz (74.1 kg) Height:  5' 10 (177.8 cm)  BEHAVIORAL SYMPTOMS/MOOD NEUROLOGICAL BOWEL NUTRITION STATUS      Continent Diet (Aspiration precautions starting at 08/04 9061  Diet Carb Modified Fluid consistency: Thin; Room service appropriate? Yes: Carb Control starting at 08/04 9078)  AMBULATORY STATUS COMMUNICATION OF NEEDS Skin   Supervision Verbally Other (Comment) (erythemia/dry)  Personal Care Assistance Level of Assistance  Bathing, Feeding, Dressing Bathing Assistance: Limited assistance Feeding assistance: Limited assistance Dressing  Assistance: Limited assistance     Functional Limitations Info  Sight, Hearing, Speech          SPECIAL CARE FACTORS FREQUENCY  OT (By licensed OT), PT (By licensed PT)     PT Frequency: 3X OT Frequency: 2X            Contractures Contractures Info: Not present    Additional Factors Info  Code Status, Allergies Code Status Info: FULL Allergies Info: NKA           Current Medications (10/24/2023):  This is the current hospital active medication list Current Facility-Administered Medications  Medication Dose Route Frequency Provider Last Rate Last Admin   acetaminophen  (TYLENOL ) tablet 650 mg  650 mg Oral Q6H PRN Von Bellis, MD       ascorbic acid  (VITAMIN C ) tablet 250 mg  250 mg Oral BID Von Bellis, MD   250 mg at 10/24/23 0844   aspirin  EC tablet 81 mg  81 mg Oral Daily Thomas, Sara-Maiz A, MD   81 mg at 10/24/23 0844   atorvastatin  (LIPITOR ) tablet 80 mg  80 mg Oral Daily Thomas, Sara-Maiz A, MD   80 mg at 10/24/23 0844   calcium  carbonate (TUMS - dosed in mg elemental calcium ) chewable tablet 200 mg of elemental calcium   1 tablet Oral TID WC Von Bellis, MD   200 mg of elemental calcium  at 10/24/23 0841   dextrose  50 % solution 0-50 mL  0-50 mL Intravenous PRN Suzanne Kirsch, MD       enoxaparin  (LOVENOX ) injection 40 mg  40 mg Subcutaneous QPM Von Bellis, MD   40 mg at 10/23/23 2149   feeding supplement (ENSURE PLUS HIGH PROTEIN) liquid 237 mL  237 mL Oral BID BM Laurita Pillion, MD       fesoterodine  (TOVIAZ ) tablet 4 mg  4 mg Oral Daily Debby Hitch A, MD   4 mg at 10/24/23 9156   gabapentin  (NEURONTIN ) capsule 200 mg  200 mg Oral TID Von Bellis, MD   200 mg at 10/24/23 9157   insulin  aspart (novoLOG ) injection 0-5 Units  0-5 Units Subcutaneous QHS Von Bellis, MD   3 Units at 10/20/23 2201   insulin  aspart (novoLOG ) injection 0-9 Units  0-9 Units Subcutaneous TID WC Von Bellis, MD   2 Units at 10/24/23 0840   insulin  aspart (novoLOG ) injection  4 Units  4 Units Subcutaneous TID WC Zhang, Dekui, MD   4 Units at 10/24/23 0841   insulin  glargine-yfgn (SEMGLEE ) injection 18 Units  18 Units Subcutaneous Q24H Laurita Pillion, MD   18 Units at 10/24/23 0841   levalbuterol  (XOPENEX ) nebulizer solution 0.63 mg  0.63 mg Nebulization Q6H PRN Debby Hitch LABOR, MD       losartan  (COZAAR ) tablet 25 mg  25 mg Oral Daily Von Bellis, MD   25 mg at 10/23/23 0920   multivitamin with minerals tablet 1 tablet  1 tablet Oral BID Von Bellis, MD   1 tablet at 10/24/23 9155   Oral care mouth rinse  15 mL Mouth Rinse PRN Laurita Pillion, MD       senna-docusate (Senokot-S) tablet 2 tablet  2 tablet Oral BID Zhang, Dekui, MD   2 tablet at 10/24/23 0843   sertraline  (ZOLOFT ) tablet 100 mg  100 mg Oral Daily Thomas, Sara-Maiz A, MD   100 mg at 10/24/23 [phone]  tiZANidine  (ZANAFLEX ) tablet 2 mg  2 mg Oral Q8H PRN Von Bellis, MD         Discharge Medications: Please see discharge summary for a list of discharge medications.  Relevant Imaging Results:  Relevant Lab Results:   Additional Information [id number]  [name], LCSW

## 2023-10-24 NOTE — Progress Notes (Signed)
 Occupational Therapy Treatment Patient Details Name: Joyce Brown MRN: [id number] DOB: 1950-10-21 Today's Date: 10/24/2023   History of present illness Pt is a 73 y.o. female with medical history significant of DM type 1, Asthma, Cerebellar Ataxia wheel chair bound, fibromyalgia, GERD, HLD, HTN  OSA on CPAP,  remote s/p tx PE, Stiff person syndrome who presents to ED s/p being found down in hotel after sister called EMS, noted for hyperglycemia upon arrival.   OT comments  Pt seen for OT tx. Pt pleasant and agreeable. Pt completed bed mobility and donned shoes seated EOB with mod indep after setup. Pt demo's good awareness of limitations and very conscious of not over doing it. After transferring to the recliner with CGA, pt completed grooming tasks seated. Pt instructed in ECS including activity pacing and work simplification. Pt verbalized understanding. Progressing well.       If plan is discharge home, recommend the following:  A little help with walking and/or transfers;A little help with bathing/dressing/bathroom;Assistance with cooking/housework;Assist for transportation;Help with stairs or ramp for entrance   Equipment Recommendations  BSC/3in1    Recommendations for Other Services      Precautions / Restrictions Precautions Precautions: Fall Recall of Precautions/Restrictions: Intact Restrictions Weight Bearing Restrictions Per Provider Order: No       Mobility Bed Mobility Overal bed mobility: Modified Independent Bed Mobility: Supine to Sit                Transfers Overall transfer level: Needs assistance Equipment used: Rolling walker (2 wheels) Transfers: Sit to/from Stand, Bed to chair/wheelchair/BSC Sit to Stand: Contact guard assist     Step pivot transfers: Contact guard assist           Balance Overall balance assessment: Needs assistance Sitting-balance support: Feet supported Sitting balance-Leahy Scale: Good     Standing balance  support: Bilateral upper extremity supported Standing balance-Leahy Scale: Fair                             ADL either performed or assessed with clinical judgement   ADL Overall ADL's : Needs assistance/impaired     Grooming: Sitting;Wash/dry face;Oral care;Set up Grooming Details (indicate cue type and reason): pt chose to complete sitting in recliner versus standing with elevated tray table 2/2 fatigue and not wanting to over do it             Lower Body Dressing: Sitting/lateral leans;Set up;Supervision/safety Lower Body Dressing Details (indicate cue type and reason): to don bil shoes             Functional mobility during ADLs: Minimal assistance;Rolling walker (2 wheels);Contact guard assist      Extremity/Trunk Assessment              Vision       Perception     Praxis     Communication     Cognition Arousal: Alert Behavior During Therapy: WFL for tasks assessed/performed Cognition: No apparent impairments                               Following commands: Intact        Cueing   Cueing Techniques: Verbal cues  Exercises Other Exercises Other Exercises: Pt edu in ECS and activity pacing to support ADL/mobility participation/safety    Shoulder Instructions       General Comments  Pertinent Vitals/ Pain       Pain Assessment Pain Assessment: No/denies pain  Home Living                                          Prior Functioning/Environment              Frequency  Min 2X/week        Progress Toward Goals  OT Goals(current goals can now be found in the care plan section)  Progress towards OT goals: Progressing toward goals  Acute Rehab OT Goals Patient Stated Goal: improve strength OT Goal Formulation: With patient Time For Goal Achievement: 10/31/23 Potential to Achieve Goals: Good  Plan      Co-evaluation                 AM-PAC OT 6 Clicks Daily Activity      Outcome Measure   Help from another person eating meals?: None Help from another person taking care of personal grooming?: A Little Help from another person toileting, which includes using toliet, bedpan, or urinal?: A Lot Help from another person bathing (including washing, rinsing, drying)?: A Lot Help from another person to put on and taking off regular upper body clothing?: A Little Help from another person to put on and taking off regular lower body clothing?: A Little 6 Click Score: 17    End of Session Equipment Utilized During Treatment: Rolling walker (2 wheels)  OT Visit Diagnosis: Other abnormalities of gait and mobility (R26.89);Muscle weakness (generalized) (M62.81)   Activity Tolerance Patient tolerated treatment well   Patient Left in chair;with call bell/phone within reach;with chair alarm set   Nurse Communication          Time: [phone] OT Time Calculation (min): 14 min  Charges: OT General Charges $OT Visit: 1 Visit OT Treatments $Self Care/Home Management : 8-22 mins  Warren SAUNDERS., MPH, MS, OTR/L ascom [phone] 10/24/23, 12:54 PM

## 2023-10-24 NOTE — Plan of Care (Signed)

## 2023-10-25 LAB — GLUCOSE, CAPILLARY
Glucose-Capillary: 100 mg/dL — ABNORMAL HIGH (ref 70–99)
Glucose-Capillary: 208 mg/dL — ABNORMAL HIGH (ref 70–99)
Glucose-Capillary: 78 mg/dL (ref 70–99)
Glucose-Capillary: 351 mg/dL — ABNORMAL HIGH (ref 70–99)

## 2023-10-25 MED ORDER — INSULIN ASPART 100 UNIT/ML IJ SOLN
12.0000 [IU] | Freq: Every day | INTRAMUSCULAR | Status: DC
  Administered 2023-10-25 – 2023-10-26 (×4): 12 [IU] via SUBCUTANEOUS
  Filled 2023-10-25 (×2): qty 1

## 2023-10-25 NOTE — Progress Notes (Signed)
 OT Cancellation Note  Patient Details Name: Joyce Brown MRN: [id number] DOB: [birth year]   Cancelled Treatment:    Reason Eval/Treat Not Completed: Fatigue/lethargy limiting ability to participate;Patient declined, no reason specified. Upon attempt, pt noting recent return to bed after being up for >4hrs. Politely declines OT tx this afternoon. Will re-attempt at later date/time.   Kilian Schwartz R., MPH, MS, OTR/L ascom [phone] 10/25/23, 2:43 PM

## 2023-10-25 NOTE — Progress Notes (Signed)
 Progress Note   Patient: Joyce Brown FMW:[id number] DOB: [birth year] DOA: 10/16/2023     9 DOS: the patient was seen and examined on 10/25/2023   Brief hospital course: Alyanna Stoermer is a 73 y.o. female with medical history significant of DM type 1, Asthma, Cerebellar Ataxia wheel chair bound, fibromyalgia, GERD, HLD, HTN  OSA on CPAP,  remote s/p tx PE, Stiff person syndrome who presents to ED s/p being found down in hotel after sister called EMS for well as patient did not sound her self on phone call. Patient states she was attempting to transfer herself from her bed to chair and fell.  Upon arriving to hospital, she was in DKA, she was placed on insulin  drip.  Condition has improved since 8/7.  But patient does not have a place to go, currently pending for assisted living transfer.   Active Problems:   DKA, type 1, not at goal [facility])   Malnutrition of moderate degree   Pressure injury of skin   AKI (acute kidney injury) (HCC)   Assessment and Plan: Type 1 diabetes with DKA. Pneumonia ruled out. Patient was previously on insulin  pump, pump was not functional for 3 days.  He came to the hospital with severe acidosis with elevated anion gap.  She was placed on insulin  drip, was able to transition to subcu insulin  on 8/7. Glucose on the lower side, I had decreased dose of insulin  Semglee  8/9.  We also decreased dose of NovoLog  scheduled. Based on glucose measurement, I have increased breakfast scheduled NovoLog  for noon hyperglycemia. Patient insulin  pump was broken, will need to discharge to facility with insulin  glargine.  Insulin  pump is mailed out, facility may have to switch insulin  when pump is available.    Acute kidney injury secondary to DKA. Hypophosphatemia Hyperkalemia. Elevated troponin secondary to demand ischemia. Condition improved.    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 daily, Bp relatively controlled.     OSA: on CPAP    Iron  deficiency anemia,  Status post IV iron .  Hb normalized        Subjective:  Patient doing well, no complaint.  Physical Exam: Vitals:   10/24/23 2047 10/25/23 0446 10/25/23 0500 10/25/23 0746  BP: (!) 118/59 129/67  (!) 116/54  Pulse: 65 64  (!) 59  Resp: 18 19  17   Temp: 98 F (36.7 C) 97.8 F (36.6 C)  97.7 F (36.5 C)  TempSrc: Oral Oral  Oral  SpO2: 98% 100%  100%  Weight:   73.6 kg   Height:       General exam: Appears calm and comfortable  Respiratory system: Clear to auscultation. Respiratory effort normal. Cardiovascular system: S1 & S2 heard, RRR. No JVD, murmurs, rubs, gallops or clicks. No pedal edema. Gastrointestinal system: Abdomen is nondistended, soft and nontender. No organomegaly or masses felt. Normal bowel sounds heard. Central nervous system: Alert and oriented. No focal neurological deficits. Extremities: Symmetric 5 x 5 power. Skin: No rashes, lesions or ulcers Psychiatry: Judgement and insight appear normal. Mood & affect appropriate.    Data Reviewed:  Lab results reviewed.  Family Communication: None  Disposition: Status is: Inpatient Remains inpatient appropriate because: Unsafe  discharge.     Time spent: 35 minutes  Author: Murvin Mana, MD 10/25/2023 11:03 AM  For on call review [email or web address].

## 2023-10-25 NOTE — Plan of Care (Signed)
  Problem: Respiratory: Goal: Ability to maintain a clear airway will improve Outcome: Progressing   Problem: Clinical Measurements: Goal: Ability to maintain clinical measurements within normal limits will improve Outcome: Progressing Goal: Will remain free from infection Outcome: Progressing

## 2023-10-25 NOTE — Progress Notes (Signed)
 Physical Therapy Treatment Patient Details Name: Joyce Brown MRN: [id number] DOB: Sep 16, 1950 Today's Date: 10/25/2023   History of Present Illness Pt is a 73 y.o. female with medical history significant of DM type 1, Asthma, Cerebellar Ataxia wheel chair bound, fibromyalgia, GERD, HLD, HTN  OSA on CPAP,  remote s/p tx PE, Stiff person syndrome who presents to ED s/p being found down in hotel after sister called EMS, noted for hyperglycemia upon arrival.    PT Comments  Pt ready for session.  Transitions to EOB with rails for support.  Steady in sitting while reaching for shoes on the floor and able to don on her own.  Purwick did not manage urine and pt wet with urine. Stood and transferred to [facility] with RW and min a x 1.  She participates in bathing with assist for back and pericare in standing after large soft BM.  She is able to transition to recliner and wants to practice sit to stand transfers 10x with emphasis on transition to standing and gaining her balance.  Pt feeling better with mobility.  Will still need +1 for mobility at ALF.   If plan is discharge home, recommend the following: Assist for transportation;A little help with bathing/dressing/bathroom;A little help with walking and/or transfers;Assistance with cooking/housework   Can travel by private [employer] (2 wheels)    Recommendations for Other Services       Precautions / Restrictions Precautions Precautions: Fall Recall of Precautions/Restrictions: Intact Restrictions Weight Bearing Restrictions Per Provider Order: No     Mobility  Bed Mobility Overal bed mobility: Modified Independent               Patient Response: Cooperative  Transfers Overall transfer level: Needs assistance Equipment used: Rolling walker (2 wheels) Transfers: Sit to/from Stand, Bed to chair/wheelchair/BSC Sit to Stand: Contact guard assist, Min assist   Step pivot transfers:  Min assist, Contact guard assist       General transfer comment: 2 x 5x sit to stand    Ambulation/Gait Ambulation/Gait assistance: Contact guard assist Gait Distance (Feet): 3 Feet Assistive device: Rolling walker (2 wheels) Gait Pattern/deviations: Step-to pattern, Decreased step length - right, Decreased step length - left, Decreased stride length Gait velocity: decr     General Gait Details: limited to transfers   Stairs             Wheelchair Mobility     Tilt Bed Tilt Bed Patient Response: Cooperative  Modified Rankin (Stroke Patients Only)       Balance Overall balance assessment: Needs assistance Sitting-balance support: Feet supported Sitting balance-Leahy Scale: Good     Standing balance support: Bilateral upper extremity supported Standing balance-Leahy Scale: Fair Standing balance comment: at least unilateral UE support required for pericare, bialteral hands for steps                            Communication Communication Communication: No apparent difficulties  Cognition Arousal: Alert Behavior During Therapy: WFL for tasks assessed/performed                             Following commands: Intact      Cueing Cueing Techniques: Verbal cues  Exercises Other Exercises Other Exercises: bathing on BSC due to inc urine from purwick leaking    General Comments  Pertinent Vitals/Pain Pain Assessment Pain Assessment: No/denies pain    Home Living                          Prior Function            PT Goals (current goals can now be found in the care plan section) Progress towards PT goals: Progressing toward goals    Frequency    Min 3X/week      PT Plan      Co-evaluation              AM-PAC PT 6 Clicks Mobility   Outcome Measure  Help needed turning from your back to your side while in a flat bed without using bedrails?: None Help needed moving from lying on your back  to sitting on the side of a flat bed without using bedrails?: None Help needed moving to and from a bed to a chair (including a wheelchair)?: A Little Help needed standing up from a chair using your arms (e.g., wheelchair or bedside chair)?: A Little Help needed to walk in hospital room?: A Lot Help needed climbing 3-5 steps with a railing? : Total 6 Click Score: 17    End of Session Equipment Utilized During Treatment: Gait belt Activity Tolerance: Patient tolerated treatment well Patient left: in chair;with call bell/phone within reach;with chair alarm set Nurse Communication: Mobility status PT Visit Diagnosis: Other abnormalities of gait and mobility (R26.89);Muscle weakness (generalized) (M62.81);Difficulty in walking, not elsewhere classified (R26.2)     Time: 9064-9042 PT Time Calculation (min) (ACUTE ONLY): 22 min  Charges:    $Therapeutic Activity: 8-22 mins PT General Charges $$ ACUTE PT VISIT: 1 Visit                   Lauraine Gills, PTA 10/25/23, 11:49 AM

## 2023-10-25 NOTE — TOC Progression Note (Signed)
 Transition of Care [facility]) - Progression Note    Patient Details  Name: Joyce Brown MRN: [id number] Date of Birth: [birth year]  Transition of Care [facility]) CM/SW Contact  Alvaro Louder, [place] Phone Number: 10/25/2023, 9:13 AM  Clinical Narrative:   LCSWA spoke to Christan from [place] [facility] at the bedside. He indicated that they can admit patient on Wednesday. Patient was agreeable of transport to [facility].   TOC to follow for discharge                      Expected Discharge Plan and Services                                               Social Drivers of Health (SDOH) Interventions SDOH Screenings   Food Insecurity: Patient Unable To Answer (10/16/2023)  Housing: Unknown (10/16/2023)  Transportation Needs: Patient Unable To Answer (10/16/2023)  Utilities: Patient Unable To Answer (10/16/2023)  Alcohol  Screen: Low Risk  (01/11/2023)  Depression (PHQ2-9): Low Risk  (10/14/2023)  Financial Resource Strain: Low Risk  (09/14/2023)  Physical Activity: Insufficiently Active (09/14/2023)  Social Connections: Patient Unable To Answer (10/16/2023)  Recent Concern: Social Connections - Socially Isolated (09/14/2023)  Stress: No Stress Concern Present (09/14/2023)  Tobacco Use: Low Risk  (10/16/2023)  Health Literacy: Adequate Health Literacy (05/12/2023)    Readmission Risk Interventions     No data to display

## 2023-10-26 DIAGNOSIS — E1010 Type 1 diabetes mellitus with ketoacidosis without coma: Secondary | ICD-10-CM | POA: Diagnosis not present

## 2023-10-26 LAB — GLUCOSE, CAPILLARY
Glucose-Capillary: 194 mg/dL — ABNORMAL HIGH (ref 70–99)
Glucose-Capillary: 116 mg/dL — ABNORMAL HIGH (ref 70–99)

## 2023-10-26 MED ORDER — INSULIN GLARGINE-YFGN 100 UNIT/ML ~~LOC~~ SOLN: 18.0000 [IU] | SUBCUTANEOUS | Status: DC

## 2023-10-26 MED ORDER — INSULIN ASPART 100 UNIT/ML IJ SOLN: 12.0000 [IU] | Freq: Every day | INTRAMUSCULAR | Status: DC

## 2023-10-26 MED ORDER — ASPIRIN 81 MG PO TBEC: 81.0000 mg | DELAYED_RELEASE_TABLET | Freq: Every day | ORAL | Status: AC

## 2023-10-26 MED ORDER — INSULIN ASPART 100 UNIT/ML IJ SOLN: 4.0000 [IU] | Freq: Two times a day (BID) | INTRAMUSCULAR | Status: DC

## 2023-10-26 MED ORDER — FESOTERODINE FUMARATE ER 4 MG PO TB24: 8.0000 mg | ORAL_TABLET | Freq: Every day | ORAL | Status: DC

## 2023-10-26 MED ORDER — LOSARTAN POTASSIUM 25 MG PO TABS: 25.0000 mg | ORAL_TABLET | Freq: Every day | ORAL | Status: DC

## 2023-10-26 NOTE — Discharge Summary (Signed)
 Joyce Brown FMW:[id number] DOB: 11-11-1950 DOA: 10/16/2023  PCP: Sharma Coyer, MD  Admit date: 10/16/2023 Discharge date: 10/26/2023  Time spent: 35 minutes  Recommendations for Outpatient Follow-up:  Close pcp f/u after discharge from rehab     Discharge Diagnoses:  Active Problems:   DKA, type 1, not at goal [facility])   Malnutrition of moderate degree   Pressure injury of skin   AKI (acute kidney injury) [facility])   Discharge Condition: improved  Diet recommendation: carb modified  Filed Weights   10/24/23 0735 10/25/23 0500 10/26/23 0500  Weight: 72.9 kg 73.6 kg 75.9 kg    History of present illness:  From admission h and p Joyce Brown is a 73 y.o. female with medical history significant of DM type 1, Asthma, Cerebellar Ataxia wheel chair bound, fibromyalgia, GERD, HLD, HTN  OSA on CPAP,  remote s/p tx PE, Stiff person syndrome who presents to ED s/p being found down in hotel after sister called EMS for well as patient did not sound her self on phone call. Patient states she was attempting to transfer herself from her bed to chair and fell.  Patient notes prior to this she was at her baseline health although on ros she has noted today she has had increase in sob. She notes he sugar was within range the last time she checked it.  Patient has insulin  pump.  She notes no hx of CVA and no new weakness and noted her speech at times is slurred and her sister corroborates this. Patient on further ros noted no cough/fever/chills /n/v/d/ dysuria or abdominal pain. She notes she is at her baseline strength she does not note new paresthesias. She states current she feels much improved from this am.   Hospital Course:   Type 1 diabetes with DKA. Pneumonia ruled out. Patient was previously on insulin  pump, pump was not functional for 3 days.  He came to the hospital with severe acidosis with elevated anion gap.  She was placed on insulin  drip, was able to transition to  subcu insulin  on 8/7. Will need ongoing titration of insulin  Patient insulin  pump was broken, will need to discharge to facility with insulin  glargine and mealtime.  Insulin  pump is mailed out, facility canto switch insulin  when pump is available.   Acute kidney injury secondary to DKA. Hypophosphatemia Hyperkalemia. Elevated troponin secondary to demand ischemia. Conditions resolved.    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 daily, Bp relatively controlled.   OSA: on CPAP    Iron  deficiency anemia,  Status post IV iron .  Hb normalized    Procedures: none   Consultations: none  Discharge Exam: Vitals:   10/26/23 0309 10/26/23 0832  BP: 121/65 (!) 112/51  Pulse: 68 73  Resp: 15 17  Temp: 98.4 F (36.9 C) 98.7 F (37.1 C)  SpO2: 100% 99%    General: NAD Cardiovascular: RRR Respiratory: CTAB  Discharge Instructions   Discharge Instructions     Diet - low sodium heart healthy   Complete by: As directed    Increase activity slowly   Complete by: As directed    No wound care   Complete by: As directed  Allergies as of 10/26/2023   No Known Allergies      Medication List     STOP taking these medications    BD Ins Syr Ultrafine 1/2Unit 31G X 5/16 0.3 ML Misc Generic drug: Insulin  Syringe-Needle U-100   Dexcom G7 Sensor Misc   glucose blood test strip   IRON  PO   NovoLOG  PenFill cartridge Generic drug: insulin  aspart Replaced by: insulin  aspart 100 UNIT/ML injection   Omega 3 1200 MG Caps   Omnipod 5 DexG7G6 Pods Gen 5 Misc   Sodium Fluoride  5000 PPM 1.1 % Pste Generic drug: Sodium Fluoride    solifenacin  10 MG  tablet Commonly known as: VESICARE  Replaced by: fesoterodine  4 MG Tb24 tablet   Vitamin D3 1000 units Caps       TAKE these medications    Acetaminophen  500 MG capsule Take 500 mg by mouth every 6 (six) hours as needed for moderate pain.   alendronate  70 MG tablet Commonly known as: FOSAMAX  Take 1 tablet (70 mg total) by mouth every 7 (seven) days. Take with a full glass of water on an empty stomach.   antiseptic oral rinse Liqd 15 mLs by Mouth Rinse route in the morning, at noon, in the evening, and at bedtime.   aspirin  EC 81 MG tablet Take 1 tablet (81 mg total) by mouth daily. Swallow whole. Start taking on: October 27, 2023   atorvastatin  80 MG tablet Commonly known as: LIPITOR  TAKE 1 TABLET (80 MG TOTAL) BY MOUTH DAILY.   Baqsimi  Two Pack 3 MG/DOSE Powd Generic drug: Glucagon  Place 3 mg into the nose as needed (For hypoglycemia).   carboxymethylcellulose 0.5 % Soln Commonly known as: REFRESH PLUS Place 1 drop into both eyes 3 (three) times daily as needed.   Centrum Silver Adult 50+ Tabs Take 1 tablet by mouth daily.   docusate sodium  100 MG capsule Commonly known as: COLACE Take 100 mg by mouth daily as needed for mild constipation.   Droplet Pen Needles 31G X 5 MM Misc Generic drug: Insulin  Pen Needle USE 4 TIMES DAILY AS DIRECTED BY YOUR DOCTOR   estradiol  0.1 MG/GM vaginal cream Commonly known as: ESTRACE  Place 1 Applicatorful vaginally 2 (two) times a week.   fesoterodine  4 MG Tb24 tablet Commonly known as: TOVIAZ  Take 2 tablets (8 mg total) by mouth daily. Start taking on: October 27, 2023 Replaces: solifenacin  10 MG tablet   gabapentin  100 MG capsule Commonly known as: NEURONTIN  Take 2 capsules (200 mg total) by mouth 3 (three) times daily.   Impoyz  0.025 % Crea Generic drug: Clobetasol  Propionate Apply topically. Patient states applys 1 x per day.   insulin  aspart 100 UNIT/ML injection Commonly known as: novoLOG  Inject 4 Units into the  skin 2 (two) times daily before lunch and supper.   insulin  aspart 100 UNIT/ML injection Commonly known as: novoLOG  Inject 12 Units into the skin daily before breakfast. Start taking on: October 27, 2023 Replaces: NovoLOG  PenFill cartridge   insulin  glargine-yfgn 100 UNIT/ML injection Commonly known as: SEMGLEE  Inject 0.18 mLs (18 Units total) into the skin daily. Start taking on: October 27, 2023   losartan  25 MG tablet Commonly known as: COZAAR  Take 1 tablet (25 mg total) by mouth daily. Start taking on: October 27, 2023   Microlet Lancets Misc 3 (three) times daily.   sertraline  100 MG tablet Commonly known as: ZOLOFT  TAKE 1 TABLET EVERY DAY   Tavaborole 5 % Soln Apply topically.   tiZANidine  2 MG tablet Commonly  known as: ZANAFLEX  TAKE 1 TO 2 TABLETS BY MOUTH AT BEDTIME AS NEEDED FOR MUSCLE SPASMS   VITAMIN B-12 PO Take by mouth.               Durable Medical Equipment  (From admission, onward)           Start     Ordered   10/18/23 1318  For home use only DME 3 n 1  Once        10/18/23 1317           No Known Allergies    The results of significant diagnostics from this hospitalization (including imaging, microbiology, ancillary and laboratory) are listed below for reference.    Significant Diagnostic Studies: ECHOCARDIOGRAM COMPLETE Result Date: 10/17/2023    ECHOCARDIOGRAM REPORT   Patient Name:   Jamile FRADD Poage Date of Exam: 10/17/2023 Medical Rec #:  [id number]           Height:       70.0 in Accession #:    [id number]          Weight:       146.8 lb Date of Birth:  02/16/1951           BSA:          1.830 m Patient Age:    73 years            BP:           102/44 mmHg Patient Gender: F                   HR:           72 bpm. Exam Location:  [facility] Procedure: 2D Echo, Cardiac Doppler, Color Doppler, Strain Analysis and 3D Echo            (Both Spectral and Color Flow Doppler were utilized during            procedure). Indications:     Other  abnormalities of the heart R00.8  History:         Patient has no prior history of Echocardiogram examinations.                  Risk Factors:Diabetes, Hypertension and Sleep Apnea. Pulmonary                  embolism.  Sonographer:     Christopher Furnace Referring Phys:  [id number] SARA-MAIZ A THOMAS Diagnosing Phys: Deatrice Cage MD  Sonographer Comments: Global longitudinal strain was attempted. IMPRESSIONS  1. Left ventricular ejection fraction, by estimation, is 65 to 70%. The left ventricle has normal function. The left ventricle has no regional wall motion abnormalities. Left ventricular diastolic parameters were normal.  2. Right ventricular systolic function is normal. The right ventricular size is normal. Tricuspid regurgitation signal is inadequate for assessing PA pressure.  3. The mitral valve is normal in structure. No evidence of mitral valve regurgitation. No evidence of mitral stenosis.  4. The aortic valve is normal in structure. Aortic valve regurgitation is not visualized. Aortic valve sclerosis/calcification is present, without any evidence of aortic stenosis.  5. The inferior vena cava is normal in size with greater than 50% respiratory variability, suggesting right atrial pressure of 3 mmHg. FINDINGS  Left Ventricle: Left ventricular ejection fraction, by estimation, is 65 to 70%. The left ventricle has normal function. The left ventricle has no regional wall motion abnormalities. Global longitudinal strain performed but not reported  based on interpreter judgement due to suboptimal tracking. The left ventricular internal cavity size was normal in size. There is no left ventricular hypertrophy. Left ventricular diastolic parameters were normal. Right Ventricle: The right ventricular size is normal. No increase in right ventricular wall thickness. Right ventricular systolic function is normal. Tricuspid regurgitation signal is inadequate for assessing PA pressure. The tricuspid regurgitant velocity is  2.44 m/s, and with an assumed right atrial pressure of 3 mmHg, the estimated right ventricular systolic pressure is 26.8 mmHg. Left Atrium: Left atrial size was normal in size. Right Atrium: Right atrial size was normal in size. Pericardium: There is no evidence of pericardial effusion. Mitral Valve: The mitral valve is normal in structure. No evidence of mitral valve regurgitation. No evidence of mitral valve stenosis. MV peak gradient, 7.2 mmHg. The mean mitral valve gradient is 2.0 mmHg. Tricuspid Valve: The tricuspid valve is normal in structure. Tricuspid valve regurgitation is trivial. No evidence of tricuspid stenosis. Aortic Valve: The aortic valve is normal in structure. Aortic valve regurgitation is not visualized. Aortic valve sclerosis/calcification is present, without any evidence of aortic stenosis. Aortic valve mean gradient measures 6.5 mmHg. Aortic valve peak  gradient measures 10.4 mmHg. Aortic valve area, by VTI measures 3.14 cm. Pulmonic Valve: The pulmonic valve was normal in structure. Pulmonic valve regurgitation is not visualized. No evidence of pulmonic stenosis. Aorta: The aortic root is normal in size and structure. Venous: The inferior vena cava is normal in size with greater than 50% respiratory variability, suggesting right atrial pressure of 3 mmHg. IAS/Shunts: No atrial level shunt detected by color flow Doppler. Additional Comments: 3D was performed not requiring image post processing on an independent workstation and was normal.  LEFT VENTRICLE PLAX 2D LVIDd:         4.20 cm   Diastology LVIDs:         2.90 cm   LV e' medial:    9.46 cm/s LV PW:         1.10 cm   LV E/e' medial:  11.3 LV IVS:        1.00 cm   LV e' lateral:   9.46 cm/s LVOT diam:     2.00 cm   LV E/e' lateral: 11.3 LV SV:         99 LV SV Index:   54 LVOT Area:     3.14 cm                           3D Volume EF:                          3D EF:        67 % RIGHT VENTRICLE RV Basal diam:  3.70 cm RV Mid diam:     2.50 cm LEFT ATRIUM             Index        RIGHT ATRIUM           Index LA diam:        2.60 cm 1.42 cm/m   RA Area:     12.30 cm LA Vol (A2C):   28.9 ml 15.79 ml/m  RA Volume:   22.90 ml  12.51 ml/m LA Vol (A4C):   24.0 ml 13.11 ml/m LA Biplane Vol: 28.2 ml 15.41 ml/m  AORTIC VALVE AV Area (Vmax):    2.67 cm AV Area (  Vmean):   2.78 cm AV Area (VTI):     3.14 cm AV Vmax:           161.00 cm/s AV Vmean:          116.500 cm/s AV VTI:            0.314 m AV Peak Grad:      10.4 mmHg AV Mean Grad:      6.5 mmHg LVOT Vmax:         137.00 cm/s LVOT Vmean:        103.000 cm/s LVOT VTI:          0.314 m LVOT/AV VTI ratio: 1.00  AORTA Ao Root diam: 3.20 cm MITRAL VALVE                TRICUSPID VALVE MV Area (PHT): 3.39 cm     TR Peak grad:   23.8 mmHg MV Area VTI:   2.73 cm     TR Vmax:        244.00 cm/s MV Peak grad:  7.2 mmHg MV Mean grad:  2.0 mmHg     SHUNTS MV Vmax:       1.34 m/s     Systemic VTI:  0.31 m MV Vmean:      69.6 cm/s    Systemic Diam: 2.00 cm MV Decel Time: 224 msec MV E velocity: 107.00 cm/s MV A velocity: 78.60 cm/s MV E/A ratio:  1.36 Deatrice Cage MD Electronically signed by Deatrice Cage MD Signature Date/Time: 10/17/2023/11:14:35 AM    Final    US  Venous Img Lower Bilateral (DVT) Result Date: 10/17/2023 CLINICAL DATA:  72 year old female with lower extremity swelling. EXAM: BILATERAL LOWER EXTREMITY VENOUS DOPPLER ULTRASOUND TECHNIQUE: Gray-scale sonography with graded compression, as well as color Doppler and duplex ultrasound were performed to evaluate the lower extremity deep venous systems from the level of the common femoral vein and including the common femoral, femoral, profunda femoral, popliteal and calf veins including the posterior tibial, peroneal and gastrocnemius veins when visible. The superficial great saphenous vein was also interrogated. Spectral Doppler was utilized to evaluate flow at rest and with distal augmentation maneuvers in the common femoral, femoral and  popliteal veins. COMPARISON:  None Available. FINDINGS: RIGHT LOWER EXTREMITY Common Femoral Vein: No evidence of thrombus. Normal compressibility, respiratory phasicity and response to augmentation. Saphenofemoral Junction: No evidence of thrombus. Normal compressibility and flow on color Doppler imaging. Profunda Femoral Vein: No evidence of thrombus. Normal compressibility and flow on color Doppler imaging. Femoral Vein: No evidence of thrombus. Normal compressibility, respiratory phasicity and response to augmentation. Popliteal Vein: No evidence of thrombus. Normal compressibility, respiratory phasicity and response to augmentation. Calf Veins: No evidence of thrombus. Normal compressibility and flow on color Doppler imaging. Other Findings:  None. LEFT LOWER EXTREMITY Common Femoral Vein: No evidence of thrombus. Normal compressibility, respiratory phasicity and response to augmentation. Saphenofemoral Junction: No evidence of thrombus. Normal compressibility and flow on color Doppler imaging. Profunda Femoral Vein: No evidence of thrombus. Normal compressibility and flow on color Doppler imaging. Femoral Vein: No evidence of thrombus. Normal compressibility, respiratory phasicity and response to augmentation. Popliteal Vein: No evidence of thrombus. Normal compressibility, respiratory phasicity and response to augmentation. Calf Veins: No evidence of thrombus. Normal compressibility and flow on color Doppler imaging. Other Findings:  None. IMPRESSION: No evidence of bilateral lower extremity deep venous thrombosis. Ester Sides, MD [facility] Radiology Electronically Signed   By: Ester  Suttle M.D.   On: 10/17/2023 07:23   CT CHEST WO CONTRAST Result Date: 10/16/2023 CLINICAL DATA:  Pneumonia suspected, uncomplicated, no prior imaging (Ped >= 21mo), abnormal chest x-ray, questionable sepsis EXAM: CT CHEST WITHOUT CONTRAST TECHNIQUE: Multidetector CT imaging  of the chest was performed following the standard protocol without IV contrast. RADIATION DOSE REDUCTION: This exam was performed according to the departmental dose-optimization program which includes automated exposure control, adjustment of the mA and/or kV according to patient size and/or use of iterative reconstruction technique. COMPARISON:  10/16/2023 FINDINGS: Cardiovascular: Unenhanced imaging of the heart is unremarkable without pericardial effusion. Normal caliber of the thoracic aorta. Atherosclerosis of the aorta and coronary vasculature. Assessment of the vascular lumen cannot be performed without IV contrast. Mediastinum/Nodes: No enlarged mediastinal or axillary lymph nodes. Thyroid  gland, trachea, and esophagus demonstrate no significant findings. Lungs/Pleura: Scattered areas of linear consolidation are seen within the dependent lower lobes, favoring atelectasis. There is no evidence of acute airspace disease, effusion, or pneumothorax. The central airways are patent. Upper Abdomen: Fat stranding within the gallbladder fossa, with high density material within the gallbladder consistent with sludge or multiple small stones. If there is concern for gallbladder pathology, right upper quadrant ultrasound may be useful. Musculoskeletal: No acute or destructive bony abnormalities. Reconstructed images demonstrate no additional findings. IMPRESSION: 1. Minimal dependent atelectasis within the lower lobes, likely accounting for prior chest x-ray finding. No acute airspace disease or signs of pneumonia. 2. High density material within the gallbladder and fat stranding in the gallbladder fossa, incompletely imaged on this exam due to slice selection. If gallbladder pathology is suspected, right upper quadrant ultrasound could be performed. 3. Aortic Atherosclerosis (ICD10-I70.0). Coronary artery atherosclerosis. Electronically Signed   By: Ozell Daring M.D.   On: 10/16/2023 18:37   DG Chest 2 View Result  Date: 10/16/2023 CLINICAL DATA:  Right basilar opacity on earlier chest x-ray, questionable sepsis EXAM: CHEST - 2 VIEW COMPARISON:  10/16/2023 FINDINGS: Frontal and lateral views of the chest are obtained on 3 images. Cardiac silhouette is unremarkable. There is patchy right lower lobe airspace disease consistent with pneumonia. No effusion or pneumothorax. No acute bony abnormalities. IMPRESSION: 1. Patchy right lower lobe airspace disease consistent with bronchopneumonia. Electronically Signed   By: Ozell Daring M.D.   On: 10/16/2023 15:31   CT Head Wo Contrast Result Date: 10/16/2023 CLINICAL DATA:  Provided history: Neuro deficit, acute, stroke suspected. Slurred speech. Patient found down. Neck trauma. EXAM: CT HEAD WITHOUT CONTRAST CT CERVICAL SPINE WITHOUT CONTRAST TECHNIQUE: Multidetector CT imaging of the head and cervical spine was performed following the standard protocol without intravenous contrast. Multiplanar CT image reconstructions of the cervical spine were also generated. RADIATION DOSE REDUCTION: This exam was performed according to the departmental dose-optimization program which includes automated exposure control, adjustment of the mA and/or kV according to patient size and/or use of iterative reconstruction technique. COMPARISON:  Non-contrast head CT and CT angiogram head/neck 04/10/2023. FINDINGS: CT HEAD FINDINGS Brain: Mild generalized parenchymal atrophy. Patchy and ill-defined hypoattenuation within the cerebral white matter, nonspecific but compatible with mild chronic small vessel ischemic disease. There is no acute intracranial hemorrhage. No demarcated cortical infarct. No extra-axial fluid collection. No evidence of an intracranial mass. No midline shift. Vascular: No hyperdense vessel.  Atherosclerotic calcifications. Skull: No calvarial fracture or aggressive osseous lesion. Sinuses/Orbits: No mass or acute finding within the imaged orbits. Postsurgical appearance of the  paranasal sinuses. No significant paranasal sinus disease. CT CERVICAL SPINE FINDINGS Alignment: Slight  grade 1 anterolisthesis at C3-C4 and C4-C5. 2 mm C7-T1 grade 1 anterolisthesis. Skull base and vertebrae: The basion-dental and atlanto-dental intervals are maintained.No evidence of acute fracture to the cervical spine. Soft tissues and spinal canal: No prevertebral fluid or swelling. No visible canal hematoma. Disc levels: Cervical spondylosis with multilevel disc space narrowing, disc bulges/central disc protrusions, uncovertebral hypertrophy and facet arthropathy. Disc space narrowing is greatest at C6-C7 (advanced at this level). C6-C7 posterior disc osteophyte complex. No appreciable high-grade spinal canal stenosis. Multilevel bony neural foraminal narrowing. Multilevel ventral osteophytes. Degenerative changes also present at the C1-C2 articulation. Upper chest: No consolidation within the imaged lung apices. No visible pneumothorax. IMPRESSION: Head CT: 1. No evidence of an acute intracranial abnormality. 2. Mild parenchymal atrophy and chronic small vessel ischemic disease. CT cervical spine: 1. No evidence of an acute cervical spine fracture. 2. Mild grade 1 anterolisthesis at C3-C4, C4-C5 and C7-T1. 3. Cervical spondylosis as described. Electronically Signed   By: Rockey Childs D.O.   On: 10/16/2023 14:01   CT Cervical Spine Wo Contrast Result Date: 10/16/2023 CLINICAL DATA:  Provided history: Neuro deficit, acute, stroke suspected. Slurred speech. Patient found down. Neck trauma. EXAM: CT HEAD WITHOUT CONTRAST CT CERVICAL SPINE WITHOUT CONTRAST TECHNIQUE: Multidetector CT imaging of the head and cervical spine was performed following the standard protocol without intravenous contrast. Multiplanar CT image reconstructions of the cervical spine were also generated. RADIATION DOSE REDUCTION: This exam was performed according to the departmental dose-optimization program which includes automated exposure  control, adjustment of the mA and/or kV according to patient size and/or use of iterative reconstruction technique. COMPARISON:  Non-contrast head CT and CT angiogram head/neck 04/10/2023. FINDINGS: CT HEAD FINDINGS Brain: Mild generalized parenchymal atrophy. Patchy and ill-defined hypoattenuation within the cerebral white matter, nonspecific but compatible with mild chronic small vessel ischemic disease. There is no acute intracranial hemorrhage. No demarcated cortical infarct. No extra-axial fluid collection. No evidence of an intracranial mass. No midline shift. Vascular: No hyperdense vessel.  Atherosclerotic calcifications. Skull: No calvarial fracture or aggressive osseous lesion. Sinuses/Orbits: No mass or acute finding within the imaged orbits. Postsurgical appearance of the paranasal sinuses. No significant paranasal sinus disease. CT CERVICAL SPINE FINDINGS Alignment: Slight grade 1 anterolisthesis at C3-C4 and C4-C5. 2 mm C7-T1 grade 1 anterolisthesis. Skull base and vertebrae: The basion-dental and atlanto-dental intervals are maintained.No evidence of acute fracture to the cervical spine. Soft tissues and spinal canal: No prevertebral fluid or swelling. No visible canal hematoma. Disc levels: Cervical spondylosis with multilevel disc space narrowing, disc bulges/central disc protrusions, uncovertebral hypertrophy and facet arthropathy. Disc space narrowing is greatest at C6-C7 (advanced at this level). C6-C7 posterior disc osteophyte complex. No appreciable high-grade spinal canal stenosis. Multilevel bony neural foraminal narrowing. Multilevel ventral osteophytes. Degenerative changes also present at the C1-C2 articulation. Upper chest: No consolidation within the imaged lung apices. No visible pneumothorax. IMPRESSION: Head CT: 1. No evidence of an acute intracranial abnormality. 2. Mild parenchymal atrophy and chronic small vessel ischemic disease. CT cervical spine: 1. No evidence of an acute  cervical spine fracture. 2. Mild grade 1 anterolisthesis at C3-C4, C4-C5 and C7-T1. 3. Cervical spondylosis as described. Electronically Signed   By: Rockey Childs D.O.   On: 10/16/2023 14:01   DG Chest Port 1 View Result Date: 10/16/2023 CLINICAL DATA:  Questionable sepsis - evaluate for abnormality EXAM: PORTABLE CHEST 1 VIEW COMPARISON:  Chest x-ray 04/10/2023. FINDINGS: The heart and mediastinal contours are unchanged. Atherosclerotic plaque. Question developing right  lower lung zone airspace opacity. No pulmonary edema. No pleural effusion. No pneumothorax. No acute osseous abnormality. IMPRESSION: 1. Question developing right lower lung zone airspace opacity. Recommend further evaluation with chest x-ray PA and lateral view. 2.  Aortic Atherosclerosis (ICD10-I70.0). Electronically Signed   By: Morgane  Naveau M.D.   On: 10/16/2023 13:15    Microbiology: Recent Results (from the past 240 hours)  Blood culture (routine single)     Status: None   Collection Time: 10/16/23 12:47 PM   Specimen: BLOOD  Result Value Ref Range Status   Specimen Description BLOOD LEFT ANTECUBITAL  Final   Special Requests   Final    BOTTLES DRAWN AEROBIC AND ANAEROBIC Blood Culture results may not be optimal due to an inadequate volume of blood received in culture bottles   Culture   Final    NO GROWTH 5 DAYS Performed at [facility], [address] [place], [place] [address]    Report Status 10/21/2023 FINAL  Final  Culture, blood (single)     Status: None   Collection Time: 10/16/23  1:06 PM   Specimen: BLOOD  Result Value Ref Range Status   Specimen Description BLOOD BLOOD RIGHT HAND  Final   Special Requests   Final    BOTTLES DRAWN AEROBIC AND ANAEROBIC Blood Culture adequate volume   Culture   Final    NO GROWTH 5 DAYS Performed at [facility], [address], [place], [place] [address]    Report Status 10/21/2023 FINAL  Final  MRSA Next Gen by PCR, Nasal     Status: None    Collection Time: 10/16/23  5:13 PM   Specimen: Urine, Clean Catch; Nasal Swab  Result Value Ref Range Status   MRSA by PCR Next Gen NOT DETECTED NOT DETECTED Final    Comment: (NOTE) The GeneXpert MRSA Assay (FDA approved for NASAL specimens only), is one component of a comprehensive MRSA colonization surveillance program. It is not intended to diagnose MRSA infection nor to guide or monitor treatment for MRSA infections. Test performance is not FDA approved in patients less than 99 years old. Performed at [facility], [address] [place], [place] [address]   Respiratory (~20 pathogens) panel by PCR     Status: None   Collection Time: 10/16/23  9:05 PM   Specimen: Nasopharyngeal Swab; Respiratory  Result Value Ref Range Status   Adenovirus NOT DETECTED NOT DETECTED Final   Coronavirus 229E NOT DETECTED NOT DETECTED Final    Comment: (NOTE) The Coronavirus on the Respiratory Panel, DOES NOT test for the novel  Coronavirus (2019 nCoV)    Coronavirus HKU1 NOT DETECTED NOT DETECTED Final   Coronavirus NL63 NOT DETECTED NOT DETECTED Final   Coronavirus OC43 NOT DETECTED NOT DETECTED Final   Metapneumovirus NOT DETECTED NOT DETECTED Final   Rhinovirus / Enterovirus NOT DETECTED NOT DETECTED Final   Influenza A NOT DETECTED NOT DETECTED Final   Influenza B NOT DETECTED NOT DETECTED Final   Parainfluenza Virus 1 NOT DETECTED NOT DETECTED Final   Parainfluenza Virus 2 NOT DETECTED NOT DETECTED Final   Parainfluenza Virus 3 NOT DETECTED NOT DETECTED Final   Parainfluenza Virus 4 NOT DETECTED NOT DETECTED Final   Respiratory Syncytial Virus NOT DETECTED NOT DETECTED Final   Bordetella pertussis NOT DETECTED NOT DETECTED Final   Bordetella Parapertussis NOT DETECTED NOT DETECTED Final   Chlamydophila pneumoniae NOT DETECTED NOT DETECTED Final   Mycoplasma pneumoniae NOT DETECTED NOT DETECTED Final  Comment: Performed at [facility], [address], [place],  [place] [address]  Culture, blood (Routine X 2) w Reflex to ID Panel     Status: None   Collection Time: 10/17/23  1:32 AM   Specimen: BLOOD LEFT HAND  Result Value Ref Range Status   Specimen Description BLOOD LEFT HAND  Final   Special Requests   Final    BOTTLES DRAWN AEROBIC ONLY Blood Culture results may not be optimal due to an inadequate volume of blood received in culture bottles   Culture   Final    NO GROWTH 5 DAYS Performed at [facility], [address] [place], [place] [address]    Report Status 10/22/2023 FINAL  Final  Culture, blood (Routine X 2) w Reflex to ID Panel     Status: None   Collection Time: 10/17/23  1:32 AM   Specimen: BLOOD LEFT HAND  Result Value Ref Range Status   Specimen Description BLOOD LEFT HAND  Final   Special Requests   Final    BOTTLES DRAWN AEROBIC ONLY Blood Culture results may not be optimal due to an inadequate volume of blood received in culture bottles   Culture   Final    NO GROWTH 5 DAYS Performed at [facility], [address], [place], [place] [address]    Report Status 10/22/2023 FINAL  Final     Labs: Basic Metabolic Panel: Recent Labs  Lab 10/20/23 0515 10/21/23 0219 10/22/23 0500  NA 142 140 140  K 3.8 4.6 4.1  CL 107 104 107  CO2 30 30 26   GLUCOSE 157* 220* 121*  BUN 17 18 18   CREATININE 0.59 0.76 0.83  CALCIUM  8.2* 8.4* 8.7*  MG 2.1  --   --   PHOS 2.6  --   --    Liver Function Tests: No results for input(s): AST, ALT, ALKPHOS, BILITOT, PROT, ALBUMIN in the last 168 hours. No results for input(s): LIPASE, AMYLASE in the last 168 hours. No results for input(s): AMMONIA in the last 168 hours. CBC: Recent Labs  Lab 10/20/23 0515 10/21/23 0219 10/22/23 0500  WBC 6.4 6.9 8.3  HGB 12.4 12.3 12.0  HCT 36.8 37.8 37.9  MCV 95.1 96.4 97.2  PLT 165 190 212   Cardiac Enzymes: No results for input(s): CKTOTAL, CKMB, CKMBINDEX, TROPONINI in the last 168 hours. BNP: BNP  (last 3 results) No results for input(s): BNP in the last 8760 hours.  ProBNP (last 3 results) No results for input(s): PROBNP in the last 8760 hours.  CBG: Recent Labs  Lab 10/25/23 0747 10/25/23 1248 10/25/23 1549 10/25/23 2102 10/26/23 0826  GLUCAP 100* 208* 78 351* 194*       Signed:  Devaughn KATHEE Ban MD.  [facility] 10/26/2023, 11:44 AM

## 2023-10-26 NOTE — Progress Notes (Signed)
 Occupational Therapy Treatment Patient Details Name: Joyce Brown MRN: [id number] DOB: January 25, 1951 Today's Date: 10/26/2023   History of present illness Pt is a 73 y.o. female with medical history significant of DM type 1, Asthma, Cerebellar Ataxia wheel chair bound, fibromyalgia, GERD, HLD, HTN  OSA on CPAP,  remote s/p tx PE, Stiff person syndrome who presents to ED s/p being found down in hotel after sister called EMS, noted for hyperglycemia upon arrival.   OT comments  Pt seen for OT treatment on this date. Upon arrival to room pt semi supine in bed, agreeable to tx. Pt MODI for bed mobility, setupA for donning bilateral shoes while seated on the EOB, CGA for STS from the RW. Pt tolerated standing pericare with setupA for washcloth and CGA while pt completed pericare with single UE support on RW. Pt retired in Medical illustrator with all needs in reach, set up for oral care later in the day. Pt making good progress toward goals, will continue to follow POC. Discharge recommendation remains appropriate.        If plan is discharge home, recommend the following:  A little help with walking and/or transfers;A little help with bathing/dressing/bathroom;Assistance with cooking/housework;Assist for transportation;Help with stairs or ramp for entrance   Equipment Recommendations  BSC/3in1    Recommendations for Other Services      Precautions / Restrictions Precautions Precautions: Fall Recall of Precautions/Restrictions: Intact Restrictions Weight Bearing Restrictions Per Provider Order: No       Mobility Bed Mobility Overal bed mobility: Modified Independent Bed Mobility: Supine to Sit           General bed mobility comments: No physical assistance to complete    Transfers Overall transfer level: Needs assistance Equipment used: Rolling walker (2 wheels) Transfers: Sit to/from Stand Sit to Stand: Contact guard assist           General transfer comment: Amb short distance  to the recliner with use of RW+ CGA for safety     Balance Overall balance assessment: Needs assistance Sitting-balance support: Feet supported Sitting balance-Leahy Scale: Good Sitting balance - Comments: Steady reach outside BOS during LB dressing   Standing balance support: Bilateral upper extremity supported Standing balance-Leahy Scale: Fair Standing balance comment: Single UE support for standing pericare                           ADL either performed or assessed with clinical judgement   ADL Overall ADL's : Needs assistance/impaired Eating/Feeding: Set up;Sitting   Grooming: Wash/dry face;Wash/dry hands;Set up;Sitting               Lower Body Dressing: Contact guard assist;Sit to/from stand   Toilet Transfer: Ambulation;Contact guard assist;Rolling walker (2 wheels);BSC/3in1 Toilet Transfer Details (indicate cue type and reason): Simulated toilet t/f Toileting- Clothing Manipulation and Hygiene: Sit to/from stand;Contact guard assist;Set up Toileting - Clothing Manipulation Details (indicate cue type and reason): Setup for washcloth, CGA for standing pericare     Functional mobility during ADLs: Contact guard assist;Rolling walker (2 wheels) General ADL Comments: Setup for Donning shoes in sitting, CGA + RW simulated toilet t/f to the recliner from EOB     Communication Communication Communication: No apparent difficulties   Cognition Arousal: Alert Behavior During Therapy: WFL for tasks assessed/performed Cognition: No apparent impairments             OT - Cognition Comments: tangential, A/Ox4  Following commands: Intact        Cueing   Cueing Techniques: Verbal cues  Exercises Exercises: Other exercises Other Exercises Other Exercises: Edu: safe transfer technique with great carryover           General Comments Pt very eager to get into recliner    Pertinent Vitals/ Pain       Pain Assessment Pain  Assessment: No/denies pain                                                          Frequency  Min 2X/week        Progress Toward Goals  OT Goals(current goals can now be found in the care plan section)  Progress towards OT goals: Progressing toward goals  Acute Rehab OT Goals OT Goal Formulation: With patient Time For Goal Achievement: 10/31/23 Potential to Achieve Goals: Good ADL Goals Pt Will Perform Grooming: with modified independence;sitting Pt Will Perform Lower Body Dressing: with modified independence;with adaptive equipment;sit to/from stand Pt Will Transfer to Toilet: with modified independence;squat pivot transfer;bedside commode  Plan      Co-evaluation                 AM-PAC OT 6 Clicks Daily Activity     Outcome Measure   Help from another person eating meals?: None Help from another person taking care of personal grooming?: A Little Help from another person toileting, which includes using toliet, bedpan, or urinal?: A Little Help from another person bathing (including washing, rinsing, drying)?: A Little Help from another person to put on and taking off regular upper body clothing?: None Help from another person to put on and taking off regular lower body clothing?: None 6 Click Score: 21    End of Session Equipment Utilized During Treatment: Rolling walker (2 wheels)  OT Visit Diagnosis: Other abnormalities of gait and mobility (R26.89);Muscle weakness (generalized) (M62.81)   Activity Tolerance Patient tolerated treatment well   Patient Left in chair;with call bell/phone within reach;with chair alarm set   Nurse Communication Mobility status        Time: 8993-8977 OT Time Calculation (min): 16 min  Charges: OT General Charges $OT Visit: 1 Visit OT Treatments $Self Care/Home Management : 8-22 mins  Larraine Colas M.S. OTR/L  10/26/23, 1:20 PM

## 2023-10-26 NOTE — Progress Notes (Signed)
 Nutrition Follow-up  DOCUMENTATION CODES:   Non-severe (moderate) malnutrition in context of chronic illness  INTERVENTION:   -Continue carb modified diet -Continue MVI with minerals BID secondary to history of roux en y gastric bypass -Continue 250 mg vitamin C  BID -Continue 100 mg thiamine  daily x 7 days -Continue 500 mg calcium  carbonate TID -Continue Magic cup BID with meals, each supplement provides 290 kcal and 9 grams of protein  -D/c Ensure Plus High Protein po BID, each supplement provides 350 kcal and 20 grams of protein   NUTRITION DIAGNOSIS:   Moderate Malnutrition related to chronic illness as evidenced by moderate fat depletion, moderate muscle depletion, percent weight loss.  Ongoing  GOAL:   Patient will meet greater than or equal to 90% of their needs  Progressing   MONITOR:   PO intake, Supplement acceptance, Labs, Weight trends, Skin, I & O's  REASON FOR ASSESSMENT:   Malnutrition Screening Tool    ASSESSMENT:   73 y/o female with h/o stiff person syndrome, chronic pain, Type I DM (positive anti-GAD antibodies diagnosed 2007), anxiety, depression, fibromyalgia, HTN, OSA, GERD, HLD, roux-en-y gastric bypass (2014) and SBO s/p laparoscopic lysis of adhesions and reduction of internal hernia (2015) who is admitted with DKA and AKI.  Reviewed I/O's: -620 ml x 24 hours and -4.8 L since admission  UOP: 1.1 L x 24 hours  Pt unavailable at time of visit.   Pt remains on carb modified diet with good oral intake. Noted meal completions 75-100%.   Wt has been stable over the past week.   Per TOC notes, plan to d/c to ALF [facility]) today.   Medications reviewed and include calcium  carbonate, neurontin , and senokot.   Labs reviewed: CBGS: 78-351 (inpatient orders for glycemic control are 0-5 units insulin  aspart daily at bedtime, 0-9 units insulin  aspart TID with meals, 12 units insulin  aspart daily, 4 units insulin  aspart BID, and 18 units insulin   glargine-yfgn daily).    Diet Order:   Diet Order             Diet Carb Modified Fluid consistency: Thin; Room service appropriate? Yes  Diet effective now                   EDUCATION NEEDS:   Education needs have been addressed  Skin:  Skin Assessment: Skin Integrity Issues: Skin Integrity Issues:: Stage I Stage I: rt buttocks  Last BM:  10/18/23  Height:   Ht Readings from Last 1 Encounters:  10/16/23 5' 10 (1.778 m)    Weight:   Wt Readings from Last 1 Encounters:  10/26/23 75.9 kg    Ideal Body Weight:  68 kg  BMI:  Body mass index is 24.01 kg/m.  Estimated Nutritional Needs:   Kcal:  1800-2100kcal/day  Protein:  90-105g/day  Fluid:  1.7-2.0L/day    [name] ORN, RD, LDN, CDCES Registered Dietitian III Certified Diabetes Care and Education Specialist If unable to reach this RD, please use RD Inpatient group chat on secure chat between hours of 8am-4 pm daily

## 2023-10-26 NOTE — Progress Notes (Signed)
 Attempted to call report X3. No answer noted at this time.  Number called: [phone]

## 2023-10-26 NOTE — TOC Transition Note (Signed)
 Transition of Care [facility]) - Discharge Note   Patient Details  Name: Joyce Brown MRN: [id number] Date of Birth: 01/07/1951  [facility]) CM/SW Contact:  Alvaro Louder, LCSW Phone Number: 10/26/2023, 1:07 PM   Clinical Narrative:   LCSWA received insurance approval for patient to admit to ALF [facility]. LCSWA confirmed with MD that patient is stable for discharge. LCSWA notified the patient and they are in agreement with discharge. LCSWA confirmed bed is available at [facility] Transport arranged with [facility] for next available.  Number to call report is [phone] room [phone]   TOC signing off  Final next level of care: Assisted Living Barriers to Discharge: No Barriers Identified   Patient Goals and CMS Choice            Discharge Placement              Patient chooses bed at:  [facility]) Patient to be transferred to facility by: [facility] Name of family member notified: Self Patient and family notified of of transfer: 10/26/23  Discharge Plan and Services Additional resources added to the After Visit Summary for                                       Social Drivers of Health (SDOH) Interventions SDOH Screenings   Food Insecurity: Patient Unable To Answer (10/16/2023)  Housing: Unknown (10/16/2023)  Transportation Needs: Patient Unable To Answer (10/16/2023)  Utilities: Patient Unable To Answer (10/16/2023)  Alcohol  Screen: Low Risk  (01/11/2023)  Depression (PHQ2-9): Low Risk  (10/14/2023)  Financial Resource Strain: Low Risk  (09/14/2023)  Physical Activity: Insufficiently Active (09/14/2023)  Social Connections: Patient Unable To Answer (10/16/2023)  Recent Concern: Social Connections - Socially Isolated (09/14/2023)  Stress: No Stress Concern Present (09/14/2023)  Tobacco Use: Low Risk  (10/16/2023)  Health Literacy: Adequate Health Literacy (05/12/2023)     Readmission Risk Interventions     No data to display

## 2023-10-27 ENCOUNTER — Emergency Department
Admission: EM | Admit: 2023-10-27 | Discharge: 2023-10-27 | Disposition: A | Discharge status: Home / Self Care | Attending: Emergency Medicine | Admitting: Emergency Medicine

## 2023-10-27 ENCOUNTER — Other Ambulatory Visit: Payer: Self-pay

## 2023-10-27 DIAGNOSIS — E1065 Type 1 diabetes mellitus with hyperglycemia: Secondary | ICD-10-CM | POA: Insufficient documentation

## 2023-10-27 DIAGNOSIS — R29898 Other symptoms and signs involving the musculoskeletal system: Secondary | ICD-10-CM | POA: Diagnosis not present

## 2023-10-27 DIAGNOSIS — R739 Hyperglycemia, unspecified: Secondary | ICD-10-CM | POA: Diagnosis not present

## 2023-10-27 DIAGNOSIS — J45909 Unspecified asthma, uncomplicated: Secondary | ICD-10-CM | POA: Insufficient documentation

## 2023-10-27 DIAGNOSIS — Z794 Long term (current) use of insulin: Secondary | ICD-10-CM | POA: Insufficient documentation

## 2023-10-27 DIAGNOSIS — Z7401 Bed confinement status: Secondary | ICD-10-CM | POA: Diagnosis not present

## 2023-10-27 DIAGNOSIS — I10 Essential (primary) hypertension: Secondary | ICD-10-CM | POA: Diagnosis not present

## 2023-10-27 LAB — URINALYSIS, ROUTINE W REFLEX MICROSCOPIC
Bacteria, UA: NONE SEEN
Bilirubin Urine: NEGATIVE
Glucose, UA: >=500 mg/dL — AB
Hgb urine dipstick: NEGATIVE
Ketones, ur: 5 mg/dL — AB
Leukocytes,Ua: NEGATIVE
Nitrite: NEGATIVE
Protein, ur: NEGATIVE mg/dL
Specific Gravity, Urine: 1.03 (ref 1.005–1.030)
pH: 7 (ref 5.0–8.0)

## 2023-10-27 LAB — CBC WITH DIFFERENTIAL/PLATELET
Abs Immature Granulocytes: 0.02 K/uL (ref 0.00–0.07)
Basophils Absolute: 0.1 K/uL (ref 0.0–0.1)
Basophils Relative: 1 %
Eosinophils Absolute: 0.1 K/uL (ref 0.0–0.5)
Eosinophils Relative: 1 %
HCT: 33.6 % — ABNORMAL LOW (ref 36.0–46.0)
Hemoglobin: 10.7 g/dL — ABNORMAL LOW (ref 12.0–15.0)
Immature Granulocytes: 0 %
Lymphocytes Relative: 15 %
Lymphs Abs: 1 K/uL (ref 0.7–4.0)
MCH: 31.2 pg (ref 26.0–34.0)
MCHC: 31.8 g/dL (ref 30.0–36.0)
MCV: 98 fL (ref 80.0–100.0)
Monocytes Absolute: 0.5 K/uL (ref 0.1–1.0)
Monocytes Relative: 8 %
Neutro Abs: 5.2 K/uL (ref 1.7–7.7)
Neutrophils Relative %: 75 %
Platelets: 269 K/uL (ref 150–400)
RBC: 3.43 MIL/uL — ABNORMAL LOW (ref 3.87–5.11)
RDW: 14.9 % (ref 11.5–15.5)
WBC: 6.9 K/uL (ref 4.0–10.5)
nRBC: 0 % (ref 0.0–0.2)

## 2023-10-27 LAB — COMPREHENSIVE METABOLIC PANEL WITH GFR
ALT: 64 U/L — ABNORMAL HIGH (ref 0–44)
AST: 61 U/L — ABNORMAL HIGH (ref 15–41)
Albumin: 3.2 g/dL — ABNORMAL LOW (ref 3.5–5.0)
Alkaline Phosphatase: 75 U/L (ref 38–126)
Anion gap: 11 (ref 5–15)
BUN: 21 mg/dL (ref 8–23)
CO2: 24 mmol/L (ref 22–32)
Calcium: 8.8 mg/dL — ABNORMAL LOW (ref 8.9–10.3)
Chloride: 101 mmol/L (ref 98–111)
Creatinine, Ser: 0.86 mg/dL (ref 0.44–1.00)
GFR, Estimated: >60 mL/min (ref >60)
Glucose, Bld: 460 mg/dL — ABNORMAL HIGH (ref 70–99)
Potassium: 4.9 mmol/L (ref 3.5–5.1)
Sodium: 136 mmol/L (ref 135–145)
Total Bilirubin: 1 mg/dL (ref 0.0–1.2)
Total Protein: 6.1 g/dL — ABNORMAL LOW (ref 6.5–8.1)

## 2023-10-27 LAB — BETA-HYDROXYBUTYRIC ACID: Beta-Hydroxybutyric Acid: 0.71 mmol/L — ABNORMAL HIGH (ref 0.05–0.27)

## 2023-10-27 LAB — BLOOD GAS, VENOUS
Acid-Base Excess: 1.7 mmol/L (ref 0.0–2.0)
Bicarbonate: 26.6 mmol/L (ref 20.0–28.0)
O2 Saturation: 83 %
Patient temperature: 37
pCO2, Ven: 42 mmHg — ABNORMAL LOW (ref 44–60)
pH, Ven: 7.41 (ref 7.25–7.43)
pO2, Ven: 51 mmHg — ABNORMAL HIGH (ref 32–45)

## 2023-10-27 LAB — CBG MONITORING, ED
Glucose-Capillary: 425 mg/dL — ABNORMAL HIGH (ref 70–99)
Glucose-Capillary: 265 mg/dL — ABNORMAL HIGH (ref 70–99)
Glucose-Capillary: 208 mg/dL — ABNORMAL HIGH (ref 70–99)

## 2023-10-27 MED ORDER — LACTATED RINGERS IV BOLUS
1000.0000 mL | Freq: Once | INTRAVENOUS | Status: AC
  Administered 2023-10-27: 1000 mL via INTRAVENOUS

## 2023-10-27 MED ORDER — INSULIN GLARGINE-YFGN 100 UNIT/ML ~~LOC~~ SOLN
18.0000 [IU] | Freq: Once | SUBCUTANEOUS | Status: AC
  Administered 2023-10-27: 18 [IU] via SUBCUTANEOUS
  Filled 2023-10-27: qty 0.18

## 2023-10-27 MED ORDER — INSULIN GLARGINE-YFGN 100 UNIT/ML ~~LOC~~ SOLN: 18.0000 [IU] | Freq: Every morning | SUBCUTANEOUS | 0 refills | Status: DC

## 2023-10-27 MED ORDER — INSULIN ASPART 100 UNIT/ML IJ SOLN: 4.0000 [IU] | Freq: Two times a day (BID) | INTRAMUSCULAR | 0 refills | Status: AC

## 2023-10-27 MED ORDER — INSULIN ASPART 100 UNIT/ML IJ SOLN
10.0000 [IU] | Freq: Once | INTRAMUSCULAR | Status: AC
  Administered 2023-10-27: 10 [IU] via INTRAVENOUS
  Filled 2023-10-27: qty 10

## 2023-10-27 MED ORDER — INSULIN ASPART 100 UNIT/ML IJ SOLN: 12.0000 [IU] | Freq: Every day | INTRAMUSCULAR | 0 refills | Status: DC

## 2023-10-27 NOTE — ED Notes (Signed)
 RN spoke with staff at [facility] RN  requested to speak to the nurse taking care of the patient regarding her discharge and to verify insulin  regimen prior to d/c. Staff member then stated that their med techs were in a meeting and could not come to the phone. [facility] then hung u

## 2023-10-27 NOTE — ED Provider Notes (Addendum)
 [facility] Provider Note    Event Date/Time   First MD Initiated Contact with Patient 10/27/23 1045     (approximate)   History   Hyperglycemia   HPI  Joyce Brown is a 73 y.o. female with type 1 diabetes, asthma, cerebellar ataxia wheelchair-bound, fibromyalgia, OSA on CPAP, stiff person syndrome who comes in with concerns for hyperglycemia patient typically uses a insulin  pump.  She states that she was admitted to the hospital for DKA and on review of records was discharged on 10/26/2023.  They were able to transfer for patient onto insulin  subcutaneously with a plan for insulin  glargine and mealtime until the insulin  pump was received by the family.  However when they got to the facility they were unable to give insulin  subcutaneously and so she has not received any insulin  for the past 24 hours.  It appears that she is on 18 units of glargine daily with 12 units of aspart before breakfast and 4 units with lunch and supper.  At this time she reports feeling well denies any falls hitting her head denies any abdominal pain.  Physical Exam   Triage Vital Signs: ED Triage Vitals  Encounter Vitals Group     BP      Girls Systolic BP Percentile      Girls Diastolic BP Percentile      Boys Systolic BP Percentile      Boys Diastolic BP Percentile      Pulse      Resp      Temp      Temp src      SpO2      Weight      Height      Head Circumference      Peak Flow      Pain Score      Pain Loc      Pain Education      Exclude from Growth Chart     Most recent vital signs: Vitals:   10/27/23 1300 10/27/23 1330  BP: 138/60 (!) 140/66  Pulse: 60 69  Resp: 14 19  Temp:    SpO2: 100% 100%     General: Awake, no distress.  CV:  Good peripheral perfusion.  Resp:  Normal effort.  Abd:  No distention.  Soft and nontender Other:     ED Results / Procedures / Treatments   Labs (all labs ordered are listed, but only abnormal results are  displayed) Labs Reviewed  CBC WITH DIFFERENTIAL/PLATELET  COMPREHENSIVE METABOLIC PANEL WITH GFR  BLOOD GAS, VENOUS  BETA-HYDROXYBUTYRIC ACID  URINALYSIS, ROUTINE W REFLEX MICROSCOPIC     EKG  My interpretation of EKG:  Sinus rate of 59 without any ST elevation or T wave inversions, normal intervals  RADIOLOGY    PROCEDURES:  Critical Care performed: Yes, see critical care procedure note(s)  .1-3 Lead EKG Interpretation  Performed by: Ernest Ronal BRAVO, MD Authorized by: Ernest Ronal BRAVO, MD     Interpretation: normal     ECG rate:  60   ECG rate assessment: normal     Rhythm: sinus rhythm     Ectopy: none     Conduction: normal   .Critical Care  Performed by: Ernest Ronal BRAVO, MD Authorized by: Ernest Ronal BRAVO, MD   Critical care provider statement:    Critical care time (minutes):  30   Critical care was necessary to treat or prevent imminent or life-threatening deterioration of the following conditions:  Endocrine crisis   Critical care was time spent personally by me on the following activities:  Development of treatment plan with patient or surrogate, discussions with consultants, evaluation of patient's response to treatment, examination of patient, ordering and review of laboratory studies, ordering and review of radiographic studies, ordering and performing treatments and interventions, pulse oximetry, re-evaluation of patient's condition and review of old charts    MEDICATIONS ORDERED IN ED: Medications  lactated ringers  bolus 1,000 mL (1,000 mLs Intravenous New Bag/Given 10/27/23 1138)  insulin  aspart (novoLOG ) injection 10 Units (10 Units Intravenous Given 10/27/23 1258)     IMPRESSION / MDM / ASSESSMENT AND PLAN / ED COURSE  I reviewed the triage vital signs and the nursing notes.   Patient's presentation is most consistent with acute presentation with potential threat to life or bodily function.   Differential is hyperglycemia, DKA, electrolyte abnormalities,  AKI.  Patient otherwise well-appearing denies any symptoms  Patient's glucose is 425 but no evidence of DKA.  White count is normal no signs for infection.  VBG without any evidence of hypercapnia beta-hydroxybutyrate only slightly elevated getting some IV fluids for that.  Her CMP showed a normal bicarb and normal anion gap.  Patient was given 10 of IV insulin  and a liter of fluid and recheck was downtrending to 200.  I have attempted to call [facility] times to try to get a better understanding of what they need to do for patient to get insulin  because it looks like the plan was to get subcu insulin  and that was what was written in the discharge summary.  Does not sound like they were going to do the insulin  pump until he can get it.  Waiting on getting a callback from [facility]  I did discuss with the diabetes coordinator who did recommend giving her her long-acting insulin  now and then she can start taking the long-acting insulin  tomorrow morning.  I will do new prescriptions for patient.  I have attempted calling [facility] multiple times without any answer.  It is unclear exactly why she had not got any insulin  for 24 hours but hopefully we have these prescriptions they will give patient her insulin .  Did get a hold of the facility who stated they had no orders for insulin  therefore I will give them printed prescriptions.  They said they would be able to give subcutaneous injections with these prescriptions.  They thought the patient had an insulin  pump on them.  The patient is on the cardiac monitor to evaluate for evidence of arrhythmia and/or significant heart rate changes.      FINAL CLINICAL IMPRESSION(S) / ED DIAGNOSES   Final diagnoses:  Hyperglycemia     Rx / DC Orders   ED Discharge Orders          Ordered    insulin  glargine-yfgn (SEMGLEE ) 100 UNIT/ML injection  Every morning        10/27/23 1511    insulin  aspart (NOVOLOG ) 100 UNIT/ML injection  2 times  daily before lunch and supper        10/27/23 1511    insulin  aspart (NOVOLOG ) 100 UNIT/ML injection  Daily before breakfast        10/27/23 1511             Note:  This document was prepared using Dragon voice recognition software and may include unintentional dictation errors.   Ernest Ronal BRAVO, MD 10/27/23 1514    Ernest Ronal BRAVO, MD 10/27/23 [phone]

## 2023-10-27 NOTE — ED Notes (Signed)
 RN spoke with Joyce Brown at [facility] and verified that facility understood instructions and insulin  regimen. Pt given copy of d/c paperwork. Danielle CSW spoke with facility about importance of answering phone calls about their residents from the hospital.

## 2023-10-27 NOTE — ED Notes (Signed)
 Called [facility]  1  1513  2times 1456, 1 1445  3 times 1436  no answer but once left message for [facility]

## 2023-10-27 NOTE — ED Triage Notes (Signed)
 Pt to ED via [facility] from [facility] for hyperglycemia. Per EMS, pt has not had insulin  since arriving at facility. Pt was d/c yesterday for DKA. Pt has type 1 DM. Current Cbg 425 on ED arrival. Pt denies pain and reports feeling tired.

## 2023-10-27 NOTE — ED Notes (Signed)
 Gave pt snack at this time per pt request and RN approval.

## 2023-10-27 NOTE — ED Notes (Signed)
 Called [facility] for transport to [facility]  will be picked up early evening

## 2023-10-27 NOTE — Discharge Instructions (Signed)
 Patient was prescribed insulin  when she was discharged and is important that she does not go without her insulin  as she can be high risk for DKA.  She also should be on a diabetic diet and limit the amount of carbohydrates that she receives.

## 2023-10-27 NOTE — ED Notes (Signed)
 RN attempted to call [facility] to discuss discharge plan and medications. RN attempted x3. Unsuccessful. Multiple messages left by [employer] operational officer.

## 2023-10-28 ENCOUNTER — Ambulatory Visit (INDEPENDENT_AMBULATORY_CARE_PROVIDER_SITE_OTHER)

## 2023-10-28 DIAGNOSIS — M2142 Flat foot [pes planus] (acquired), left foot: Secondary | ICD-10-CM | POA: Diagnosis not present

## 2023-10-28 DIAGNOSIS — M2040 Other hammer toe(s) (acquired), unspecified foot: Secondary | ICD-10-CM

## 2023-10-28 DIAGNOSIS — M2141 Flat foot [pes planus] (acquired), right foot: Secondary | ICD-10-CM

## 2023-10-28 DIAGNOSIS — M2010 Hallux valgus (acquired), unspecified foot: Secondary | ICD-10-CM

## 2023-10-28 DIAGNOSIS — E1042 Type 1 diabetes mellitus with diabetic polyneuropathy: Secondary | ICD-10-CM | POA: Diagnosis not present

## 2023-10-31 DIAGNOSIS — G119 Hereditary ataxia, unspecified: Secondary | ICD-10-CM | POA: Diagnosis not present

## 2023-10-31 DIAGNOSIS — H409 Unspecified glaucoma: Secondary | ICD-10-CM | POA: Diagnosis not present

## 2023-10-31 DIAGNOSIS — E1039 Type 1 diabetes mellitus with other diabetic ophthalmic complication: Secondary | ICD-10-CM | POA: Diagnosis not present

## 2023-10-31 DIAGNOSIS — M47816 Spondylosis without myelopathy or radiculopathy, lumbar region: Secondary | ICD-10-CM | POA: Diagnosis not present

## 2023-10-31 DIAGNOSIS — M4800 Spinal stenosis, site unspecified: Secondary | ICD-10-CM | POA: Diagnosis not present

## 2023-10-31 DIAGNOSIS — M47819 Spondylosis without myelopathy or radiculopathy, site unspecified: Secondary | ICD-10-CM | POA: Diagnosis not present

## 2023-10-31 DIAGNOSIS — S92902D Unspecified fracture of left foot, subsequent encounter for fracture with routine healing: Secondary | ICD-10-CM | POA: Diagnosis not present

## 2023-10-31 DIAGNOSIS — R197 Diarrhea, unspecified: Secondary | ICD-10-CM | POA: Diagnosis not present

## 2023-10-31 NOTE — Progress Notes (Signed)
 Shoes shipped to patient

## 2023-11-01 ENCOUNTER — Ambulatory Visit (INDEPENDENT_AMBULATORY_CARE_PROVIDER_SITE_OTHER): Admitting: Family Medicine

## 2023-11-01 ENCOUNTER — Encounter: Payer: Self-pay | Admitting: Family Medicine

## 2023-11-01 VITALS — BP 105/58 | HR 70 | Resp 16 | Ht 70.0 in | Wt 160.9 lb

## 2023-11-01 DIAGNOSIS — Z993 Dependence on wheelchair: Secondary | ICD-10-CM | POA: Diagnosis not present

## 2023-11-01 DIAGNOSIS — E1042 Type 1 diabetes mellitus with diabetic polyneuropathy: Secondary | ICD-10-CM

## 2023-11-01 DIAGNOSIS — L609 Nail disorder, unspecified: Secondary | ICD-10-CM

## 2023-11-01 DIAGNOSIS — M810 Age-related osteoporosis without current pathological fracture: Secondary | ICD-10-CM | POA: Diagnosis not present

## 2023-11-01 DIAGNOSIS — H04129 Dry eye syndrome of unspecified lacrimal gland: Secondary | ICD-10-CM

## 2023-11-01 DIAGNOSIS — K146 Glossodynia: Secondary | ICD-10-CM | POA: Diagnosis not present

## 2023-11-01 DIAGNOSIS — L439 Lichen planus, unspecified: Secondary | ICD-10-CM

## 2023-11-01 DIAGNOSIS — N3941 Urge incontinence: Secondary | ICD-10-CM

## 2023-11-01 DIAGNOSIS — G119 Hereditary ataxia, unspecified: Secondary | ICD-10-CM

## 2023-11-01 DIAGNOSIS — R29898 Other symptoms and signs involving the musculoskeletal system: Secondary | ICD-10-CM | POA: Diagnosis not present

## 2023-11-01 MED ORDER — CLOBETASOL PROPIONATE 0.025 % EX CREA: TOPICAL_CREAM | CUTANEOUS | 2 refills | Status: AC

## 2023-11-01 MED ORDER — INSULIN ASPART 100 UNIT/ML IJ SOLN: 12.0000 [IU] | Freq: Every day | INTRAMUSCULAR | 3 refills | Status: AC

## 2023-11-01 MED ORDER — INSULIN GLARGINE-YFGN 100 UNIT/ML ~~LOC~~ SOLN: 18.0000 [IU] | Freq: Every morning | SUBCUTANEOUS | 4 refills | Status: AC

## 2023-11-01 MED ORDER — ALENDRONATE SODIUM 70 MG PO TABS: 70.0000 mg | ORAL_TABLET | ORAL | 11 refills | Status: AC

## 2023-11-01 MED ORDER — ESTRADIOL 0.1 MG/GM VA CREA: 1.0000 | TOPICAL_CREAM | VAGINAL | 6 refills | Status: AC

## 2023-11-01 MED ORDER — FESOTERODINE FUMARATE ER 4 MG PO TB24: 8.0000 mg | ORAL_TABLET | Freq: Every day | ORAL | 3 refills | Status: DC

## 2023-11-01 NOTE — Patient Instructions (Addendum)
  VISIT SUMMARY: Today, we reviewed your recent hospital stay and follow-up care for type 1 diabetes, including episodes of diabetic ketoacidosis and hyperglycemia. We also discussed your current medications, dietary concerns, recent loss of strength, and other ongoing health issues.  YOUR PLAN: -TYPE 1 DIABETES MELLITUS WITH RECENT DKA AND INSULIN  PUMP MANAGEMENT: Type 1 diabetes is a condition where your body does not produce insulin , leading to high blood sugar levels. You recently experienced diabetic ketoacidosis (DKA) due to an insulin  pump malfunction. Continue taking Semglee  18 units in the morning and Novolog  12 units before breakfast, 4 units before lunch and dinner. Monitor your blood glucose levels three times daily. We are working to get your insulin  pump and Dexcom sensors delivered and operational, and we are trying to get an earlier appointment with your endocrinologist.   YOUR NEW ENDOCRINOLOGY APPT IS 11/23/23  -MUSCULAR DECONDITIONING: Muscular deconditioning is the loss of muscle strength due to inactivity. You have experienced a notable loss of strength over the past 10-14 days, likely due to your recent hospitalization. We will submit a referral for physical therapy to help you regain strength.  -DIARRHEA, RECENT ONSET: Diarrhea is frequent, loose, or watery bowel movements. You have had diarrhea since Friday, possibly related to your diet at the rehab facility. It has decreased, but it's important to stay hydrated. Continue drinking plenty of fluids.  -ONYCHOMYCOSIS: Onychomycosis is a fungal infection of the nails. Continue applying clobetasol  ointment and Tavaborole 5% solution as directed.  -OVERACTIVE BLADDER: Overactive bladder is a condition where you feel a sudden urge to urinate. Continue taking Toviaz  8 mg daily as prescribed.  -OSTEOPOROSIS: Osteoporosis is a condition where bones become weak and brittle. We will send a new prescription for Fosamax  70 mg to be taken  once a week.  -VULVOVAGINAL ATROPHY: Vulvovaginal atrophy is the thinning and inflammation of the vaginal walls due to decreased estrogen. We will send a new prescription for Estrace  cream to be applied vaginally twice a week.  -DEPRESSION: Depression is a mood disorder characterized by persistent feelings of sadness and loss of interest. Continue taking sertraline  100 mg daily as prescribed.  -HYPERLIPIDEMIA: Hyperlipidemia is having high levels of fats (lipids) in the blood. Continue taking atorvastatin  80 mg daily as prescribed.  -CHRONIC NEUROPATHIC PAIN: Chronic neuropathic pain is long-term pain caused by nerve damage. Continue taking gabapentin  200 mg three times a day as prescribed.  -DRY MOUTH (XEROSTOMIA): Dry mouth is a condition where your mouth feels unusually dry. We have added biotin to your medication list to help manage this condition.  INSTRUCTIONS: Please monitor your blood glucose levels three times daily and ensure you stay hydrated. We are working to get your insulin  pump and Dexcom sensors delivered and operational. We will also try to get an ear lier appointment with your endocrinologist. A referral for physical therapy will be submitted to help with your muscle strength. Continue with your current medications as prescribed, and we will send new prescriptions for Fosamax  and Estrace  cream. Follow up with us  if you have any concerns or if your symptoms worsen.

## 2023-11-01 NOTE — Progress Notes (Addendum)
 1     Established patient visit   Patient: Joyce Brown   DOB: 24-Jan-1951   73 y.o. Female  MRN: [id number] Visit Date: 11/01/2023  Today's healthcare provider: Rockie Agent, MD   Chief Complaint  Patient presents with   Hospitalization Follow-up    Hosp f/u   Subjective     HPI     Hospitalization Follow-up    Additional comments: Hosp f/u      Last edited by Marylen Odella CROME, CMA on 11/01/2023  9:47 AM.       Discussed the use of AI scribe software for clinical note transcription with the patient, who gave verbal consent to proceed.  History of Present Illness  ED visit on 10/27/23 for hyperglycemia with glucose of 425     Joyce Brown is a 73 year old female with type 1 diabetes who presents for a hospital follow-up after recent episodes of diabetic ketoacidosis and hyperglycemia.  She was admitted to the hospital from August 3rd to August 13th for altered mental status and diabetic ketoacidosis due to type 1 diabetes. Her insulin  pump had not been functioning for three days prior to admission. After discharge, she was sent to a rehabilitation facility but experienced a mishap with her insulin  delivery, leading to another emergency department visit on August 14th for hyperglycemia with a glucose level of 425 mg/dL.  Currently, she is on Semglee  18 units in the morning, Novolog  12 units before breakfast, and 4 units before lunch and dinner. Her blood sugar level was 72 mg/dL this morning. She is awaiting the return of her insulin  pump and has a follow-up with endocrinology scheduled for October.  She has dietary concerns at her rehabilitation facility, noting high salt content and inappropriate foods for her diabetic diet, such as sweet tea and gravy. She has experienced runny diarrhea since Friday, which has improved today. She is maintaining hydration by drinking water regularly.  She notes a distinct loss of strength over the past 10 to 14  days and feels weak and tired, especially after her recent hospital stay. She has been performing ankle pumps and wrist circles to maintain some physical activity.  Her current medications include atorvastatin  80 mg, aspirin  81 mg, gabapentin  200 mg three times a day, sertraline  100 mg daily, and insulin  syringes. She also uses clobetasol  ointment for her nails.  ED Medication changes  Semglee  - 18 units in the morning  Novolog  BID - 4 units  Novo log before breakfast  - 12 units   Admitted 10/16/23-10/26/23 for AMS, DKA -fell while trying to transfer from bed to chair  - found to be in DKA as insulin  pump was not functioning for 3 days  - was treated in the ICU      Past Medical History:  Diagnosis Date   Arthritis 1995   Have family history of it.   Asthma 1995   Has resolved   Cataract 2017   Family History of them   Cerebellar ataxia (HCC)    Depression 1996   Due to Fibromyalgia.   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   Glaucoma Various   Family History of it.   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   antiseptic oral rinse (BIOTENE) LIQD 15 mLs by Mouth Rinse route in the morning,  at noon, in the evening, and at bedtime.   aspirin  EC 81 MG tablet Take 1 tablet (81 mg total) by mouth daily. Swallow whole.   atorvastatin  (LIPITOR ) 80 MG tablet TAKE 1 TABLET (80 MG TOTAL) BY MOUTH DAILY.   carboxymethylcellulose (REFRESH PLUS) 0.5 % SOLN Place 1 drop into both eyes 3 (three) times daily as needed.   Cyanocobalamin  (VITAMIN B-12 PO) Take by mouth.   docusate sodium  (COLACE) 100 MG capsule Take 100 mg by mouth daily as needed for mild constipation.   gabapentin  (NEURONTIN ) 100 MG capsule Take 2 capsules (200 mg total) by mouth 3 (three) times daily.   Glucagon  (BAQSIMI  TWO PACK) 3 MG/DOSE POWD Place 3 mg into the nose as needed (For hypoglycemia).   insulin  aspart (NOVOLOG ) 100 UNIT/ML injection Inject 4 Units into the skin 2 (two) times daily before lunch and supper.   Insulin  Pen Needle (DROPLET PEN NEEDLES) 31G X 5 MM MISC USE 4 TIMES DAILY AS DIRECTED BY YOUR DOCTOR   losartan  (COZAAR ) 25 MG tablet Take 1 tablet (25 mg total) by mouth daily.   Microlet Lancets MISC 3 (three) times daily.   Multiple Vitamins-Minerals (CENTRUM SILVER ADULT 50+) TABS Take 1 tablet by mouth daily.   sertraline  (ZOLOFT ) 100 MG tablet TAKE 1 TABLET EVERY DAY   Tavaborole 5 % SOLN Apply topically.   tiZANidine  (ZANAFLEX ) 2 MG tablet TAKE 1 TO 2 TABLETS BY MOUTH AT BEDTIME AS NEEDED FOR MUSCLE SPASMS   [DISCONTINUED] alendronate  (FOSAMAX ) 70 MG tablet Take 1 tablet (70 mg total) by mouth every 7 (seven) days. Take with a full glass of water on an empty stomach.   [DISCONTINUED] Clobetasol  Propionate (IMPOYZ ) 0.025 % CREA Apply topically. Patient states applys 1 x per day.   [DISCONTINUED] estradiol  (ESTRACE ) 0.1 MG/GM vaginal cream Place 1 Applicatorful vaginally 2 (two) times a week.   [DISCONTINUED] fesoterodine  (TOVIAZ ) 4 MG TB24 tablet Take 2 tablets (8 mg total) by mouth daily.   [DISCONTINUED] insulin  aspart (NOVOLOG ) 100 UNIT/ML injection Inject 12 Units into the skin daily before breakfast.    [DISCONTINUED] insulin  glargine-yfgn (SEMGLEE ) 100 UNIT/ML injection Inject 0.18 mLs (18 Units total) into the skin in the morning.   No facility-administered medications prior to visit.    Review of Systems  Last metabolic panel Lab Results  Component Value Date   GLUCOSE 460 (H) 10/27/2023   NA 136 10/27/2023   K 4.9 10/27/2023   CL 101 10/27/2023   CO2 24 10/27/2023   BUN 21 10/27/2023   CREATININE 0.86 10/27/2023   GFRNONAA >60 10/27/2023   CALCIUM  8.8 (L) 10/27/2023   PHOS 2.6 10/20/2023   PROT 6.1 (L) 10/27/2023   ALBUMIN 3.2 (L) 10/27/2023   LABGLOB 2.0 06/09/2022   AGRATIO 2.1 06/09/2022   BILITOT 1.0 10/27/2023   ALKPHOS 75 10/27/2023  AST 61 (H) 10/27/2023   ALT 64 (H) 10/27/2023   ANIONGAP 11 10/27/2023   Last lipids Lab Results  Component Value Date   CHOL 139 09/09/2022   HDL 46.60 09/09/2022   LDLCALC 72 09/09/2022   TRIG 104.0 09/09/2022   CHOLHDL 3 09/09/2022   Last hemoglobin A1c Lab Results  Component Value Date   HGBA1C 9.1 (H) 04/11/2023   Last thyroid  functions Lab Results  Component Value Date   TSH 1.37 06/26/2021   Last vitamin D  Lab Results  Component Value Date   VD25OH 41.64 10/17/2023        Objective    BP (!) 105/58 (BP Location: Right Arm, Patient Position: Sitting, Cuff Size: Normal)   Pulse 70   Resp 16   Ht 5' 10 (1.778 m)   Wt 160 lb 15 oz (73 kg)   SpO2 97%   BMI 23.09 kg/m  BP Readings from Last 3 Encounters:  11/01/23 (!) 105/58  10/27/23 133/64  10/26/23 (!) 112/51   Wt Readings from Last 3 Encounters:  11/01/23 160 lb 15 oz (73 kg)  10/27/23 165 lb (74.8 kg)  10/26/23 167 lb 5.3 oz (75.9 kg)        Physical Exam Vitals reviewed.  Constitutional:      General: She is not in acute distress.    Appearance: She is not ill-appearing, toxic-appearing or diaphoretic.     Comments: Elderly female in NAD, seated in wheel chair  Tired appearing   Eyes:     Conjunctiva/sclera: Conjunctivae  normal.  Cardiovascular:     Rate and Rhythm: Normal rate and regular rhythm.     Pulses: Normal pulses.     Heart sounds: Normal heart sounds. No murmur heard.    No friction rub. No gallop.  Pulmonary:     Effort: Pulmonary effort is normal. No respiratory distress.     Breath sounds: Normal breath sounds. No stridor. No wheezing, rhonchi or rales.  Abdominal:     General: Bowel sounds are normal. There is no distension.     Palpations: Abdomen is soft.     Tenderness: There is no abdominal tenderness.  Musculoskeletal:     Right lower leg: No edema.     Left lower leg: No edema.  Skin:    Comments: Discoloration of left index fingernail   Neurological:     Mental Status: She is alert and oriented to person, place, and time.  Psychiatric:        Mood and Affect: Mood and affect normal.        Speech: Speech normal.        Behavior: Behavior normal. Behavior is cooperative.     Physical Exam      No results found for any visits on 11/01/23.  Assessment & Plan     Problem List Items Addressed This Visit       Digestive   Burning mouth syndrome     Endocrine   Type 1 diabetes mellitus with diabetic polyneuropathy (HCC) - Primary (Chronic)   Relevant Medications   insulin  glargine-yfgn (SEMGLEE ) 100 UNIT/ML injection   insulin  aspart (NOVOLOG ) 100 UNIT/ML injection     Nervous and Auditory   Cerebellar ataxia (HCC) (Chronic)     Musculoskeletal and Integument   Lichen planus (Chronic)   Relevant Medications   Clobetasol  Propionate (IMPOYZ ) 0.025 % CREA   estradiol  (ESTRACE ) 0.1 MG/GM vaginal cream (Start on 11/03/2023)   Age-related osteoporosis without current pathological fracture  Relevant Medications   alendronate  (FOSAMAX ) 70 MG tablet     Other   Wheelchair dependence (Chronic)   Urge incontinence of urine (Chronic)   Relevant Medications   fesoterodine  (TOVIAZ ) 4 MG TB24 tablet   Dry eye   Other Visit Diagnoses       Fingernail problem          Muscular deconditioning       Relevant Orders   Ambulatory referral to Physical Therapy       Assessment and Plan Assessment & Plan Type 1 diabetes mellitus with recent DKA and insulin  pump management Resolved, ongoing chronic DM 1 not yet at goal  Recent hospitalization for DKA due to insulin  pump malfunction. Current management with Semglee  and Novolog  until pump is operational. Blood sugar levels have stabilized, with a recent reading of 72 mg/dL. Awaiting insulin  pump and Dexcom sensor delivery. Endocrinology follow-up scheduled for October, but earlier appointment is being sought due to recent complications. - Continue Semglee  18 units in the morning - Administer Novolog  12 units before breakfast, 4 units before lunch and dinner - Monitor blood glucose levels three times daily - Facilitate earlier endocrinology appointment- updated to 11/23/23  - Ensure insulin  pump and Dexcom sensors are delivered and operational  Muscular deconditioning Notable loss of strength over the past 10-14 days, likely due to recent hospitalization and inactivity. - Submit referral for physical therapy at [facility] brought in form that was completed and returned to her to be submitted along with electronic external referral sent in [facility] -patient to continue doing exercises in chair to help with increasing endurance until able to start formal therapy   Diarrhea, recent onset Recent onset of diarrhea since Friday, possibly related to dietary intake at the rehab facility. Diarrhea has decreased, but dehydration is a concern. - Encourage adequate fluid intake to prevent dehydration  Onychomycosis Chronic, under treatment  Fingernail changes being treated with topical clobetasol  and Tavaborole 5% solution. - Continue clobetasol  application for nail fungus - Continue Tavaborole 5% solution application  Overactive bladder Chronic,well controlled  Managed with Toviaz  8 mg daily. - Continue Toviaz  8  mg daily  Osteoporosis Chronic  Requires ongoing management with Fosamax . - Send new prescription for Fosamax  70 mg once a week  Vulvovaginal atrophy Chronic  Managed with Estrace  cream applied vaginally twice a week. - Send new prescription for Estrace  cream  Depression Chronic, stable Managed with sertraline  100 mg daily. - Continue sertraline  100 mg daily  Hyperlipidemia Chronic  Managed with atorvastatin  80 mg daily. - Continue atorvastatin  80 mg daily  Chronic neuropathic pain Managed with gabapentin  200 mg three times a day. - Continue gabapentin  200 mg three times a day  Dry mouth (xerostomia) - Add biotin to medication list   Total time spent on today's visit was 60 minutes, including both face-to-face time interviewing and examining the patient, reviewing medical record including labs/imaging/specialist notes, developing and discussing further evaluation,answering patient's questions, completing paperwork for assisted living facility orders/med changes/organizing printed prescriptions and coordinating follow up care in addition to documenting in the patient's chart.    Return in about 6 weeks (around 12/13/2023) for DM.         Rockie Agent, MD  [facility] [phone] (phone) [phone] (fax)  [facility]

## 2023-11-02 DIAGNOSIS — H409 Unspecified glaucoma: Secondary | ICD-10-CM | POA: Diagnosis not present

## 2023-11-02 DIAGNOSIS — E1039 Type 1 diabetes mellitus with other diabetic ophthalmic complication: Secondary | ICD-10-CM | POA: Diagnosis not present

## 2023-11-04 ENCOUNTER — Other Ambulatory Visit

## 2023-11-04 DIAGNOSIS — M6259 Muscle wasting and atrophy, not elsewhere classified, multiple sites: Secondary | ICD-10-CM | POA: Diagnosis not present

## 2023-11-04 DIAGNOSIS — M4800 Spinal stenosis, site unspecified: Secondary | ICD-10-CM | POA: Diagnosis not present

## 2023-11-04 DIAGNOSIS — M47819 Spondylosis without myelopathy or radiculopathy, site unspecified: Secondary | ICD-10-CM | POA: Diagnosis not present

## 2023-11-04 DIAGNOSIS — R2681 Unsteadiness on feet: Secondary | ICD-10-CM | POA: Diagnosis not present

## 2023-11-04 DIAGNOSIS — R270 Ataxia, unspecified: Secondary | ICD-10-CM | POA: Diagnosis not present

## 2023-11-04 DIAGNOSIS — R279 Unspecified lack of coordination: Secondary | ICD-10-CM | POA: Diagnosis not present

## 2023-11-04 DIAGNOSIS — R278 Other lack of coordination: Secondary | ICD-10-CM | POA: Diagnosis not present

## 2023-11-04 DIAGNOSIS — M47816 Spondylosis without myelopathy or radiculopathy, lumbar region: Secondary | ICD-10-CM | POA: Diagnosis not present

## 2023-11-04 DIAGNOSIS — Z9181 History of falling: Secondary | ICD-10-CM | POA: Diagnosis not present

## 2023-11-07 DIAGNOSIS — R278 Other lack of coordination: Secondary | ICD-10-CM | POA: Diagnosis not present

## 2023-11-07 DIAGNOSIS — M47816 Spondylosis without myelopathy or radiculopathy, lumbar region: Secondary | ICD-10-CM | POA: Diagnosis not present

## 2023-11-07 DIAGNOSIS — M47819 Spondylosis without myelopathy or radiculopathy, site unspecified: Secondary | ICD-10-CM | POA: Diagnosis not present

## 2023-11-07 DIAGNOSIS — M4800 Spinal stenosis, site unspecified: Secondary | ICD-10-CM | POA: Diagnosis not present

## 2023-11-11 DIAGNOSIS — M47819 Spondylosis without myelopathy or radiculopathy, site unspecified: Secondary | ICD-10-CM | POA: Diagnosis not present

## 2023-11-11 DIAGNOSIS — M47816 Spondylosis without myelopathy or radiculopathy, lumbar region: Secondary | ICD-10-CM | POA: Diagnosis not present

## 2023-11-11 DIAGNOSIS — M4800 Spinal stenosis, site unspecified: Secondary | ICD-10-CM | POA: Diagnosis not present

## 2023-11-11 DIAGNOSIS — R278 Other lack of coordination: Secondary | ICD-10-CM | POA: Diagnosis not present

## 2023-11-11 NOTE — Addendum Note (Signed)
 Addended by: SIMMONS-ROBINSON, Catalino Plascencia L on: 11/11/2023 03:07 PM   Modules accepted: Level of Service

## 2023-11-15 ENCOUNTER — Other Ambulatory Visit: Payer: Self-pay | Admitting: *Deleted

## 2023-11-15 DIAGNOSIS — R278 Other lack of coordination: Secondary | ICD-10-CM | POA: Diagnosis not present

## 2023-11-15 DIAGNOSIS — M47819 Spondylosis without myelopathy or radiculopathy, site unspecified: Secondary | ICD-10-CM | POA: Diagnosis not present

## 2023-11-15 DIAGNOSIS — M4800 Spinal stenosis, site unspecified: Secondary | ICD-10-CM | POA: Diagnosis not present

## 2023-11-15 DIAGNOSIS — M47816 Spondylosis without myelopathy or radiculopathy, lumbar region: Secondary | ICD-10-CM | POA: Diagnosis not present

## 2023-11-15 NOTE — Patient Instructions (Signed)
 Visit Information  Thank you for taking time to visit with me today. Please don't hesitate to contact me if I can be of assistance to you before our next scheduled appointment.  Closing From: Complex Care Management. Patient resides at ALF level of care at this time.  Please call the care guide team at [phone] if you need to cancel, schedule, or reschedule an appointment.   Please call the Suicide and Crisis Lifeline: 988 call the [place]  National Suicide Prevention Lifeline: (217)161-6041 or TTY: 518-873-7870 TTY (319)191-6627) to talk to a trained counselor call 1-800-273-TALK (toll free, 24 hour hotline) if you are experiencing a Mental Health or Behavioral Health Crisis or need someone to talk to.   Olam Ku, RN, BSN [place]  [facility], [facility] RN Care Manager Direct Dial: [phone]  Fax: [phone]

## 2023-11-15 NOTE — Patient Outreach (Signed)
 Complex Care Management   Visit Note  11/15/2023  Name:  Joyce Brown MRN: [id number] DOB: May 02, 1950  Situation: Referral received for Complex Care Management related to Diabetes. I obtained verbal consent from Patient.  Visit completed with Patient  on the phone  Background:   Past Medical History:  Diagnosis Date   Arthritis 1995   Have family history of it.   Asthma 1995   Has resolved   Cataract 2017   Family History of them   Cerebellar ataxia (HCC)    Depression 1996   Due to Fibromyalgia.   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   Glaucoma Various   Family History of it.   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 Case closed. Patient Reported Symptoms:  Cognitive Cognitive Status: Alert and oriented to person, place, and time, Able to follow simple commands, Normal speech and language skills Cognitive/Intellectual Conditions Management [RPT]: None reported or documented in medical history or problem list      Neurological Neurological Review of Symptoms: No symptoms reported    HEENT HEENT Symptoms Reported: No symptoms reported      Cardiovascular Cardiovascular Symptoms Reported: No symptoms  reported    Respiratory Respiratory Symptoms Reported: No symptoms reported Respiratory Management Strategies: Routine screening  Endocrine Endocrine Symptoms Reported: No symptoms reported Is patient diabetic?: Yes Is patient checking blood sugars at home?: Yes List most recent blood sugar readings, include date and time of day: fasting 251 at the facitily (ALF)    Gastrointestinal Gastrointestinal Symptoms Reported: No symptoms reported      Genitourinary Genitourinary Symptoms Reported: No symptoms reported    Integumentary Integumentary Symptoms Reported: No symptoms reported    Musculoskeletal Musculoskelatal Symptoms Reviewed: No symptoms reported        Psychosocial Psychosocial Symptoms Reported: No symptoms reported          There were no vitals filed for this visit.  Medications Reviewed Today     Reviewed by Alvia Olam BIRCH, RN (Registered Nurse) on 11/15/23 at 1051  Med List Status: <None>   Medication Order Taking? Sig Documenting Provider Last Dose Status Informant  Acetaminophen  500 MG capsule [id number] Yes Take 500 mg by mouth every 6 (six) hours as needed for moderate pain. [provider]  Active Self, Pharmacy Records           Med Note RAYNE, ARLEY HERO   Sun Oct 16, 2023  3:45 PM) PRN  alendronate  (FOSAMAX ) 70 MG tablet [id number] Yes Take 1 tablet (70 mg total) by mouth every 7 (seven) days. Take with a full glass of water on an empty stomach. Simmons-Robinson, Rockie, MD  Active   antiseptic oral rinse (BIOTENE)  LIQD [id number] Yes 15 mLs by Mouth Rinse route in the morning, at noon, in the evening, and at bedtime. [provider]  Active Self, Pharmacy Records  aspirin  EC 81 MG tablet [id number] Yes Take 1 tablet (81 mg total) by mouth daily. Swallow whole. Wouk, Devaughn Sayres, MD  Active   atorvastatin  (LIPITOR ) 80 MG tablet [id number] Yes TAKE 1 TABLET (80 MG TOTAL) BY MOUTH DAILY. Ostwalt, Janna, PA-C  Active Self, Pharmacy Records   carboxymethylcellulose (REFRESH PLUS) 0.5 % SOLN [id number] Yes Place 1 drop into both eyes 3 (three) times daily as needed. [provider]  Active Self, Pharmacy Records           Med Note RAYNE, ARLEY HERO   Sun Oct 16, 2023  3:46 PM) PRN  Clobetasol  Propionate (IMPOYZ ) 0.025 % CREA [id number] Yes Patient states applys 1 x per day. Simmons-Robinson, Makiera, MD  Active   Cyanocobalamin  (VITAMIN B-12 PO) [id number] Yes Take by mouth. [provider]  Active Self, Pharmacy Records  docusate sodium  (COLACE) 100 MG capsule [id number] Yes Take 100 mg by mouth daily as needed for mild constipation. [provider]  Active Self, Pharmacy Records  estradiol  (ESTRACE ) 0.1 MG/GM vaginal cream [id number] Yes Place 1 Applicatorful vaginally 2 (two) times a week. Simmons-Robinson, Makiera, MD  Active   fesoterodine  (TOVIAZ ) 4 MG TB24 tablet [id number] Yes Take 2 tablets (8 mg total) by mouth daily. Simmons-Robinson, Makiera, MD  Active   gabapentin  (NEURONTIN ) 100 MG capsule [id number] Yes Take 2 capsules (200 mg total) by mouth 3 (three) times daily. Simmons-Robinson, Rockie, MD  Active Self, Pharmacy Records  Glucagon  (BAQSIMI  TWO PACK) 3 MG/DOSE POWD [id number] Yes Place 3 mg into the nose as needed (For hypoglycemia). Caleen Qualia, MD  Active Self, Pharmacy Records           Med Note [phone], ARLEY HERO   Sun Oct 16, 2023  3:47 PM) PRN  insulin  aspart (NOVOLOG ) 100 UNIT/ML injection [id number] Yes Inject 4 Units into the skin 2 (two) times daily before lunch and supper. Ernest Ronal BRAVO, MD  Active   insulin  aspart (NOVOLOG ) 100 UNIT/ML injection [id number] Yes Inject 12 Units into the skin daily before breakfast. Simmons-Robinson, Makiera, MD  Active   insulin  glargine-yfgn (SEMGLEE ) 100 UNIT/ML injection [id number] Yes Inject 0.18 mLs (18 Units total) into the skin in the morning. Simmons-Robinson, Makiera, MD  Active   Insulin  Pen Needle (DROPLET PEN NEEDLES) 31G X 5 MM MISC [id number] Yes USE  4 TIMES DAILY AS DIRECTED BY YOUR DOCTOR Shamleffer, Donell Cardinal, MD  Active Self, Pharmacy Records  losartan  (COZAAR ) 25 MG tablet [id number] Yes Take 1 tablet (25 mg total) by mouth daily. Wouk, Devaughn Sayres, MD  Active   Microlet Lancets MISC [id number] Yes 3 (three) times daily. [provider]  Active Self, Pharmacy Records  Multiple Vitamins-Minerals (CENTRUM SILVER ADULT 50+) TABS [id number] Yes Take 1 tablet by mouth daily. [provider]  Active Self, Pharmacy Records  sertraline  (ZOLOFT ) 100 MG tablet [id number] Yes TAKE 1 TABLET EVERY DAY Simmons-Robinson, Makiera, MD  Active Self, Pharmacy Records  Tavaborole 5 % SOLN [id number] Yes Apply topically. [provider]  Active Self, Pharmacy Records  tiZANidine  (ZANAFLEX ) 2 MG tablet [id number] Yes TAKE 1 TO 2 TABLETS BY MOUTH AT BEDTIME AS NEEDED FOR MUSCLE SPASMS Simmons-Robinson, Makiera, MD  Active Self, Pharmacy Records            Follow Up Plan:   Closing From:  Complex Care  Management. Patient ALF at [facility] for resident.   Olam Ku, RN, BSN [place]  [facility], [facility] RN Care Manager Direct Dial: [phone]  Fax: [phone]

## 2023-11-16 DIAGNOSIS — R279 Unspecified lack of coordination: Secondary | ICD-10-CM | POA: Diagnosis not present

## 2023-11-16 DIAGNOSIS — M6259 Muscle wasting and atrophy, not elsewhere classified, multiple sites: Secondary | ICD-10-CM | POA: Diagnosis not present

## 2023-11-17 DIAGNOSIS — R279 Unspecified lack of coordination: Secondary | ICD-10-CM | POA: Diagnosis not present

## 2023-11-17 DIAGNOSIS — M6259 Muscle wasting and atrophy, not elsewhere classified, multiple sites: Secondary | ICD-10-CM | POA: Diagnosis not present

## 2023-11-18 DIAGNOSIS — R278 Other lack of coordination: Secondary | ICD-10-CM | POA: Diagnosis not present

## 2023-11-18 DIAGNOSIS — R279 Unspecified lack of coordination: Secondary | ICD-10-CM | POA: Diagnosis not present

## 2023-11-18 DIAGNOSIS — M6259 Muscle wasting and atrophy, not elsewhere classified, multiple sites: Secondary | ICD-10-CM | POA: Diagnosis not present

## 2023-11-18 DIAGNOSIS — M47816 Spondylosis without myelopathy or radiculopathy, lumbar region: Secondary | ICD-10-CM | POA: Diagnosis not present

## 2023-11-18 DIAGNOSIS — M47819 Spondylosis without myelopathy or radiculopathy, site unspecified: Secondary | ICD-10-CM | POA: Diagnosis not present

## 2023-11-18 DIAGNOSIS — M4800 Spinal stenosis, site unspecified: Secondary | ICD-10-CM | POA: Diagnosis not present

## 2023-11-19 DIAGNOSIS — R197 Diarrhea, unspecified: Secondary | ICD-10-CM | POA: Diagnosis not present

## 2023-11-19 DIAGNOSIS — S92909D Unspecified fracture of unspecified foot, subsequent encounter for fracture with routine healing: Secondary | ICD-10-CM | POA: Diagnosis not present

## 2023-11-19 DIAGNOSIS — R270 Ataxia, unspecified: Secondary | ICD-10-CM | POA: Diagnosis not present

## 2023-11-19 DIAGNOSIS — E10649 Type 1 diabetes mellitus with hypoglycemia without coma: Secondary | ICD-10-CM | POA: Diagnosis not present

## 2023-11-21 DIAGNOSIS — M47816 Spondylosis without myelopathy or radiculopathy, lumbar region: Secondary | ICD-10-CM | POA: Diagnosis not present

## 2023-11-21 DIAGNOSIS — M4800 Spinal stenosis, site unspecified: Secondary | ICD-10-CM | POA: Diagnosis not present

## 2023-11-21 DIAGNOSIS — M47819 Spondylosis without myelopathy or radiculopathy, site unspecified: Secondary | ICD-10-CM | POA: Diagnosis not present

## 2023-11-21 DIAGNOSIS — R278 Other lack of coordination: Secondary | ICD-10-CM | POA: Diagnosis not present

## 2023-11-23 DIAGNOSIS — R2689 Other abnormalities of gait and mobility: Secondary | ICD-10-CM | POA: Diagnosis not present

## 2023-11-23 DIAGNOSIS — M62838 Other muscle spasm: Secondary | ICD-10-CM | POA: Diagnosis not present

## 2023-11-23 DIAGNOSIS — R296 Repeated falls: Secondary | ICD-10-CM | POA: Diagnosis not present

## 2023-11-23 DIAGNOSIS — Z1331 Encounter for screening for depression: Secondary | ICD-10-CM | POA: Diagnosis not present

## 2023-11-23 DIAGNOSIS — R2681 Unsteadiness on feet: Secondary | ICD-10-CM | POA: Diagnosis not present

## 2023-11-23 DIAGNOSIS — G629 Polyneuropathy, unspecified: Secondary | ICD-10-CM | POA: Diagnosis not present

## 2023-11-23 DIAGNOSIS — G119 Hereditary ataxia, unspecified: Secondary | ICD-10-CM | POA: Diagnosis not present

## 2023-11-23 DIAGNOSIS — R200 Anesthesia of skin: Secondary | ICD-10-CM | POA: Diagnosis not present

## 2023-11-24 DIAGNOSIS — M6259 Muscle wasting and atrophy, not elsewhere classified, multiple sites: Secondary | ICD-10-CM | POA: Diagnosis not present

## 2023-11-24 DIAGNOSIS — R279 Unspecified lack of coordination: Secondary | ICD-10-CM | POA: Diagnosis not present

## 2023-11-25 DIAGNOSIS — R279 Unspecified lack of coordination: Secondary | ICD-10-CM | POA: Diagnosis not present

## 2023-11-25 DIAGNOSIS — M4800 Spinal stenosis, site unspecified: Secondary | ICD-10-CM | POA: Diagnosis not present

## 2023-11-25 DIAGNOSIS — M6259 Muscle wasting and atrophy, not elsewhere classified, multiple sites: Secondary | ICD-10-CM | POA: Diagnosis not present

## 2023-11-25 DIAGNOSIS — M47819 Spondylosis without myelopathy or radiculopathy, site unspecified: Secondary | ICD-10-CM | POA: Diagnosis not present

## 2023-11-25 DIAGNOSIS — R278 Other lack of coordination: Secondary | ICD-10-CM | POA: Diagnosis not present

## 2023-11-25 DIAGNOSIS — M47816 Spondylosis without myelopathy or radiculopathy, lumbar region: Secondary | ICD-10-CM | POA: Diagnosis not present

## 2023-11-28 DIAGNOSIS — M810 Age-related osteoporosis without current pathological fracture: Secondary | ICD-10-CM | POA: Diagnosis not present

## 2023-11-28 DIAGNOSIS — E1042 Type 1 diabetes mellitus with diabetic polyneuropathy: Secondary | ICD-10-CM | POA: Diagnosis not present

## 2023-11-28 DIAGNOSIS — M47816 Spondylosis without myelopathy or radiculopathy, lumbar region: Secondary | ICD-10-CM | POA: Diagnosis not present

## 2023-11-28 DIAGNOSIS — M4800 Spinal stenosis, site unspecified: Secondary | ICD-10-CM | POA: Diagnosis not present

## 2023-11-28 DIAGNOSIS — E1065 Type 1 diabetes mellitus with hyperglycemia: Secondary | ICD-10-CM | POA: Diagnosis not present

## 2023-11-28 DIAGNOSIS — E10319 Type 1 diabetes mellitus with unspecified diabetic retinopathy without macular edema: Secondary | ICD-10-CM | POA: Diagnosis not present

## 2023-11-28 DIAGNOSIS — R270 Ataxia, unspecified: Secondary | ICD-10-CM | POA: Diagnosis not present

## 2023-11-28 DIAGNOSIS — R278 Other lack of coordination: Secondary | ICD-10-CM | POA: Diagnosis not present

## 2023-11-28 DIAGNOSIS — E782 Mixed hyperlipidemia: Secondary | ICD-10-CM | POA: Diagnosis not present

## 2023-11-28 DIAGNOSIS — M47819 Spondylosis without myelopathy or radiculopathy, site unspecified: Secondary | ICD-10-CM | POA: Diagnosis not present

## 2023-12-02 ENCOUNTER — Encounter: Payer: Self-pay | Admitting: Podiatry

## 2023-12-02 ENCOUNTER — Ambulatory Visit (INDEPENDENT_AMBULATORY_CARE_PROVIDER_SITE_OTHER): Admitting: Podiatry

## 2023-12-02 DIAGNOSIS — M47816 Spondylosis without myelopathy or radiculopathy, lumbar region: Secondary | ICD-10-CM | POA: Diagnosis not present

## 2023-12-02 DIAGNOSIS — M79675 Pain in left toe(s): Secondary | ICD-10-CM | POA: Diagnosis not present

## 2023-12-02 DIAGNOSIS — M4800 Spinal stenosis, site unspecified: Secondary | ICD-10-CM | POA: Diagnosis not present

## 2023-12-02 DIAGNOSIS — E1042 Type 1 diabetes mellitus with diabetic polyneuropathy: Secondary | ICD-10-CM

## 2023-12-02 DIAGNOSIS — M79674 Pain in right toe(s): Secondary | ICD-10-CM

## 2023-12-02 DIAGNOSIS — M6259 Muscle wasting and atrophy, not elsewhere classified, multiple sites: Secondary | ICD-10-CM | POA: Diagnosis not present

## 2023-12-02 DIAGNOSIS — R279 Unspecified lack of coordination: Secondary | ICD-10-CM | POA: Diagnosis not present

## 2023-12-02 DIAGNOSIS — B351 Tinea unguium: Secondary | ICD-10-CM | POA: Diagnosis not present

## 2023-12-02 DIAGNOSIS — R278 Other lack of coordination: Secondary | ICD-10-CM | POA: Diagnosis not present

## 2023-12-02 DIAGNOSIS — M47819 Spondylosis without myelopathy or radiculopathy, site unspecified: Secondary | ICD-10-CM | POA: Diagnosis not present

## 2023-12-05 ENCOUNTER — Encounter: Payer: Self-pay | Admitting: Podiatry

## 2023-12-05 DIAGNOSIS — R278 Other lack of coordination: Secondary | ICD-10-CM | POA: Diagnosis not present

## 2023-12-05 DIAGNOSIS — M47819 Spondylosis without myelopathy or radiculopathy, site unspecified: Secondary | ICD-10-CM | POA: Diagnosis not present

## 2023-12-05 DIAGNOSIS — M47816 Spondylosis without myelopathy or radiculopathy, lumbar region: Secondary | ICD-10-CM | POA: Diagnosis not present

## 2023-12-05 DIAGNOSIS — M4800 Spinal stenosis, site unspecified: Secondary | ICD-10-CM | POA: Diagnosis not present

## 2023-12-05 NOTE — Progress Notes (Signed)
  Subjective:  Patient ID: Joyce Brown, female    DOB: 08-09-1950,  MRN: [id number]  Joyce Brown presents to clinic today for at risk foot care with history of diabetic neuropathy and painful mycotic toenails x 10 which interfere with daily activities. Pain is relieved with periodic professional debridement. Patient is happy she has moved in [facility]. She is acclimating well. Chief Complaint  Patient presents with   Joyce Brown    Rm3 Diabetic foot care/ Dr. Rockie Marcine Brown last visit August 2025/ A1c 8   New problem(s): None.   PCP is Joyce Brown.  No Known Allergies  Review of Systems: Negative except as noted in the HPI.  Objective: No changes noted in today's physical examination. There were no vitals filed for this visit. Joyce Brown is a pleasant 73 y.o. female WD, WN in NAD. AAO x 3.  Vascular Examination: Capillary refill time immediate b/l. Vascular status intact b/l with palpable pedal pulses. Pedal hair present b/l. No pain with calf compression b/l. Skin temperature gradient WNL b/l. No cyanosis or clubbing b/l. No ischemia or gangrene noted b/l.   Neurological Examination: Sensation grossly intact b/l with 10 gram monofilament. Vibratory sensation intact b/l. Pt has subjective symptoms of neuropathy.  Dermatological Examination: Pedal skin with normal turgor, texture and tone b/l.  No open wounds. No interdigital macerations.   Toenails 1-5 b/l thick, discolored, elongated with subungual debris and pain on dorsal palpation.   No corns, calluses nor porokeratotic lesions noted.  Musculoskeletal Examination: Muscle strength 5/5 to all lower extremity muscle groups bilaterally. No pain, crepitus or joint limitation noted with ROM bilateral LE. Hammertoe deformity noted 2-5 b/l. Utilizes wheelchair for mobility assistance.  Assessment/Plan: 1. Pain due to onychomycosis of toenails of both feet   2.  Diabetic polyneuropathy associated with type 1 diabetes mellitus (HCC)    Consent given for treatment. Patient examined. All patient's and/or POA's questions/concerns addressed on today's visit. Toenails 1-5 debrided in length and girth without incident. Treatment was provided by assistant Joyce Brown under my supervision. Continue foot and shoe inspections daily. Monitor blood glucose per PCP/Endocrinologist's recommendations. Continue soft, supportive shoe gear daily. Report any pedal injuries to medical professional. Call office if there are any questions/concerns.  Return in about 3 months (around 03/02/2024).  Joyce Brown, DPM      [place] LOCATION: [address], [place] [address]                   Office [phone]   [place] LOCATION: [address] [place], [place] [address] Office [phone]

## 2023-12-08 DIAGNOSIS — M6259 Muscle wasting and atrophy, not elsewhere classified, multiple sites: Secondary | ICD-10-CM | POA: Diagnosis not present

## 2023-12-08 DIAGNOSIS — R279 Unspecified lack of coordination: Secondary | ICD-10-CM | POA: Diagnosis not present

## 2023-12-09 DIAGNOSIS — M4800 Spinal stenosis, site unspecified: Secondary | ICD-10-CM | POA: Diagnosis not present

## 2023-12-09 DIAGNOSIS — R278 Other lack of coordination: Secondary | ICD-10-CM | POA: Diagnosis not present

## 2023-12-09 DIAGNOSIS — M47816 Spondylosis without myelopathy or radiculopathy, lumbar region: Secondary | ICD-10-CM | POA: Diagnosis not present

## 2023-12-09 DIAGNOSIS — M47819 Spondylosis without myelopathy or radiculopathy, site unspecified: Secondary | ICD-10-CM | POA: Diagnosis not present

## 2023-12-09 DIAGNOSIS — M6259 Muscle wasting and atrophy, not elsewhere classified, multiple sites: Secondary | ICD-10-CM | POA: Diagnosis not present

## 2023-12-09 DIAGNOSIS — R279 Unspecified lack of coordination: Secondary | ICD-10-CM | POA: Diagnosis not present

## 2023-12-10 DIAGNOSIS — H409 Unspecified glaucoma: Secondary | ICD-10-CM | POA: Diagnosis not present

## 2023-12-10 DIAGNOSIS — E1039 Type 1 diabetes mellitus with other diabetic ophthalmic complication: Secondary | ICD-10-CM | POA: Diagnosis not present

## 2023-12-14 ENCOUNTER — Ambulatory Visit: Admitting: Family Medicine

## 2023-12-14 ENCOUNTER — Encounter: Payer: Self-pay | Admitting: Family Medicine

## 2023-12-14 VITALS — BP 109/57 | HR 62 | Temp 97.9°F | Ht 70.0 in | Wt 150.9 lb

## 2023-12-14 DIAGNOSIS — M6259 Muscle wasting and atrophy, not elsewhere classified, multiple sites: Secondary | ICD-10-CM | POA: Diagnosis not present

## 2023-12-14 DIAGNOSIS — R279 Unspecified lack of coordination: Secondary | ICD-10-CM | POA: Diagnosis not present

## 2023-12-14 DIAGNOSIS — E785 Hyperlipidemia, unspecified: Secondary | ICD-10-CM | POA: Diagnosis not present

## 2023-12-14 DIAGNOSIS — E10649 Type 1 diabetes mellitus with hypoglycemia without coma: Secondary | ICD-10-CM | POA: Diagnosis not present

## 2023-12-14 DIAGNOSIS — Z993 Dependence on wheelchair: Secondary | ICD-10-CM | POA: Diagnosis not present

## 2023-12-14 NOTE — Patient Instructions (Addendum)
 To keep you healthy, please keep in mind the following health maintenance items that you are due for:   There are no preventive care reminders to display for this patient.   Best Wishes,   Dr. Lang

## 2023-12-14 NOTE — Progress Notes (Signed)
 Established patient visit   Patient: Joyce Brown   DOB: [birth year]   73 y.o. Female  MRN: [id number] Visit Date: 12/14/2023  Today's healthcare provider: Rockie Agent, MD   Chief Complaint  Patient presents with   Medical Management of Chronic Issues    Patient present for follow up of DM, reports doing well over the past few weeks    Diabetes   Subjective     HPI     Medical Management of Chronic Issues    Additional comments: Patient present for follow up of DM, reports doing well over the past few weeks       Last edited by Cherry Chiquita HERO, CMA on 12/14/2023  3:12 PM.       Discussed the use of AI scribe software for clinical note transcription with the patient, who gave verbal consent to proceed.  History of Present Illness Joyce Brown is a 73 year old female with type one diabetes who presents for routine follow-up.  She has a history of type one diabetes and is currently using a pod and sensor system for insulin  delivery. The pod is replaced every three days and the sensor every ten days. She uses Novolog  insulin . Her last A1c was 8.1. Her G7 app on her phone has been helpful in managing her diabetes.  She is undergoing treatment for osteoporosis and has been receiving occupational and physical therapy, which she finds beneficial, particularly in improving her leg strength and balance. No falls have been reported, and she is actively participating in therapy sessions once or twice a week, depending on the therapist's schedule.  She lives in a facility where she has access to a new chef, which has somewhat improved her dietary management. She is involved in a program to receive COVID and flu vaccinations at her residence.   Past Medical History:  Diagnosis Date   Arthritis 1995   Have family history of it.   Asthma 1995   Has resolved   Cataract 2017   Family History of them   Cerebellar ataxia (HCC)    Depression 1996   Due to  Fibromyalgia.   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   Glaucoma Various   Family History of it.   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   alendronate  (FOSAMAX ) 70 MG tablet Take 1 tablet (70 mg total) by mouth every 7 (seven) days. Take with a full glass of water on an empty stomach.   antiseptic oral rinse (BIOTENE) LIQD 15 mLs by Mouth Rinse route in the morning, at noon, in the evening, and at bedtime.   aspirin  EC  81 MG tablet Take 1 tablet (81 mg total) by mouth daily. Swallow whole.   atorvastatin  (LIPITOR ) 80 MG tablet TAKE 1 TABLET (80 MG TOTAL) BY MOUTH DAILY.   BD SAFETYGLIDE INSULIN  SYRINGE 31G X 15/64 0.5 ML MISC    carboxymethylcellulose (REFRESH PLUS) 0.5 % SOLN Place 1 drop into both eyes 3 (three) times daily as needed.   Cholecalciferol (VITAMIN D3) 25 MCG (1000 UT) CAPS Take 1 capsule by mouth daily.   Clobetasol  Propionate (IMPOYZ ) 0.025 % CREA Patient states applys 1 x per day.   Cyanocobalamin  (VITAMIN B-12 PO) Take by mouth.   docusate sodium  (COLACE) 100 MG capsule Take 100 mg by mouth daily as needed for mild constipation.   estradiol  (ESTRACE ) 0.1 MG/GM vaginal cream Place 1  Applicatorful vaginally 2 (two) times a week.   Ferrous Sulfate (IRON ) 325 (65 Fe) MG TABS Take 1 tablet by mouth every morning.   fesoterodine  (TOVIAZ ) 4 MG TB24 tablet Take 2 tablets (8 mg total) by mouth daily.   gabapentin  (NEURONTIN ) 100 MG capsule Take 2 capsules (200 mg total) by mouth 3 (three) times daily.   Glucagon  (BAQSIMI  TWO PACK) 3 MG/DOSE POWD Place 3 mg into the nose as needed (For hypoglycemia).   insulin  aspart (NOVOLOG ) 100 UNIT/ML injection Inject 4 Units into the skin 2 (two) times daily before lunch and supper.   insulin  aspart (NOVOLOG ) 100 UNIT/ML injection Inject 12 Units into the skin daily before breakfast.   Insulin  Disposable Pump (OMNIPOD 5 DEXG7G6 PODS GEN 5) MISC SMARTSIG:SUB-Q Every 3 Days   insulin  glargine-yfgn (SEMGLEE ) 100 UNIT/ML injection Inject 0.18 mLs (18 Units total) into the skin in the morning.   Insulin  Pen Needle (DROPLET PEN NEEDLES) 31G X 5 MM MISC USE 4 TIMES DAILY AS DIRECTED BY YOUR DOCTOR   LANTUS  SOLOSTAR 100 UNIT/ML Solostar Pen Inject into the skin.   losartan  (COZAAR ) 25 MG tablet Take 1 tablet (25 mg total) by mouth daily.   Microlet Lancets MISC 3 (three) times daily.   Multiple Vitamins-Minerals (CENTRUM SILVER ADULT 50+) TABS Take 1 tablet by mouth daily.   Omega 3-6-9 CAPS Take 1 capsule by mouth daily.   omega-3 acid ethyl esters (LOVAZA) 1 g capsule Take by mouth.   sertraline  (ZOLOFT ) 100 MG tablet TAKE 1 TABLET EVERY DAY   SODIUM FLUORIDE  5000 PPM 1.1 % GEL dental gel SMARTSIG:1 sparingly By Mouth Every Night   solifenacin  (VESICARE ) 10 MG tablet Take 10 mg by mouth daily.   Tavaborole 5 % SOLN Apply topically.   tiZANidine  (ZANAFLEX ) 2 MG tablet TAKE 1 TO 2 TABLETS BY MOUTH AT BEDTIME AS NEEDED FOR MUSCLE SPASMS   No facility-administered medications prior to visit.    Review of Systems  Last CBC Lab Results  Component Value Date   WBC 6.9 10/27/2023   HGB 10.7 (L) 10/27/2023   HCT 33.6 (L) 10/27/2023   MCV 98.0  10/27/2023   MCH 31.2 10/27/2023   RDW 14.9 10/27/2023   PLT 269 10/27/2023   Last metabolic panel Lab Results  Component Value Date   GLUCOSE 460 (H) 10/27/2023   NA 136 10/27/2023   K 4.9 10/27/2023   CL 101 10/27/2023   CO2 24 10/27/2023   BUN 21 10/27/2023   CREATININE 0.86 10/27/2023   GFRNONAA >60 10/27/2023   CALCIUM  8.8 (L) 10/27/2023   PHOS 2.6 10/20/2023   PROT 6.1 (L) 10/27/2023   ALBUMIN 3.2 (L) 10/27/2023   LABGLOB 2.0 06/09/2022   AGRATIO  2.1 06/09/2022   BILITOT 1.0 10/27/2023   ALKPHOS 75 10/27/2023   AST 61 (H) 10/27/2023   ALT 64 (H) 10/27/2023   ANIONGAP 11 10/27/2023   Last lipids Lab Results  Component Value Date   CHOL 139 09/09/2022   HDL 46.60 09/09/2022   LDLCALC 72 09/09/2022   TRIG 104.0 09/09/2022   CHOLHDL 3 09/09/2022   Last hemoglobin A1c Lab Results  Component Value Date   HGBA1C 9.1 (H) 04/11/2023   Last thyroid  functions Lab Results  Component Value Date   TSH 1.37 06/26/2021   Last vitamin D  Lab Results  Component Value Date   VD25OH 41.64 10/17/2023   Last vitamin B12 and Folate Lab Results  Component Value Date   VITAMINB12 3,163 (H) 10/17/2023   FOLATE 12.5 10/17/2023        Objective    BP (!) 109/57 (BP Location: Left Arm, Patient Position: Sitting, Cuff Size: Normal)   Pulse 62   Temp 97.9 F (36.6 C) (Oral)   Ht 5' 10 (1.778 m)   Wt 150 lb 14.4 oz (68.4 kg)   SpO2 99%   BMI 21.65 kg/m  BP Readings from Last 3 Encounters:  12/14/23 (!) 109/57  11/01/23 (!) 105/58  10/27/23 133/64   Wt Readings from Last 3 Encounters:  12/14/23 150 lb 14.4 oz (68.4 kg)  11/01/23 160 lb 15 oz (73 kg)  10/27/23 165 lb (74.8 kg)       Physical Exam Vitals reviewed.  Constitutional:      General: She is not in acute distress.    Appearance: Normal appearance. She is not ill-appearing, toxic-appearing or diaphoretic.     Comments: Elderly female in NAD, seated in wheel chair   Eyes:     Conjunctiva/sclera:  Conjunctivae normal.  Cardiovascular:     Rate and Rhythm: Normal rate and regular rhythm.     Pulses: Normal pulses.     Heart sounds: Normal heart sounds. No murmur heard.    No friction rub. No gallop.  Pulmonary:     Effort: Pulmonary effort is normal. No respiratory distress.     Breath sounds: Normal breath sounds. No stridor. No wheezing, rhonchi or rales.  Abdominal:     General: Bowel sounds are normal. There is no distension.     Palpations: Abdomen is soft.     Tenderness: There is no abdominal tenderness.  Musculoskeletal:     Right lower leg: No edema.     Left lower leg: No edema.  Skin:    Findings: No erythema or rash.  Neurological:     Mental Status: She is alert and oriented to person, place, and time.  Psychiatric:        Mood and Affect: Mood and affect normal.        Speech: Speech normal.        Behavior: Behavior normal. Behavior is cooperative.       No results found for any visits on 12/14/23.  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 No recent falls reported. Physical and occupational therapy has improved strength and balance. - Resume Fosamax  70 mg once a week. - Continue physical and occupational therapy. - Ensure adequate hydration when taking Fosamax .  Fingernail disorder, improving Fingernail disorder is improving. -Follow up with  dermatology as previously scheduled       Return in about 4 months (around 04/15/2024) for Chronic F/U.         Rockie Agent, MD  [facility] [phone] (phone) [phone] (fax)  [facility]

## 2023-12-16 DIAGNOSIS — M4800 Spinal stenosis, site unspecified: Secondary | ICD-10-CM | POA: Diagnosis not present

## 2023-12-16 DIAGNOSIS — M47816 Spondylosis without myelopathy or radiculopathy, lumbar region: Secondary | ICD-10-CM | POA: Diagnosis not present

## 2023-12-16 DIAGNOSIS — R278 Other lack of coordination: Secondary | ICD-10-CM | POA: Diagnosis not present

## 2023-12-16 DIAGNOSIS — M47819 Spondylosis without myelopathy or radiculopathy, site unspecified: Secondary | ICD-10-CM | POA: Diagnosis not present

## 2023-12-18 NOTE — Assessment & Plan Note (Signed)
 Chronic  -Follow up with endocrinology as previously scheduled  - continue use of novolog  pump  -continue semglee  as directed  -continue omnipod for monitoring and insulin  administration

## 2023-12-18 NOTE — Assessment & Plan Note (Signed)
 Chronic  Stable  Patient using wheelchair during today's visit, independent

## 2023-12-18 NOTE — Assessment & Plan Note (Signed)
Chronic Continue atorvastatin 80 mg daily 

## 2023-12-20 DIAGNOSIS — M47816 Spondylosis without myelopathy or radiculopathy, lumbar region: Secondary | ICD-10-CM | POA: Diagnosis not present

## 2023-12-20 DIAGNOSIS — M47819 Spondylosis without myelopathy or radiculopathy, site unspecified: Secondary | ICD-10-CM | POA: Diagnosis not present

## 2023-12-20 DIAGNOSIS — R278 Other lack of coordination: Secondary | ICD-10-CM | POA: Diagnosis not present

## 2023-12-20 DIAGNOSIS — M4800 Spinal stenosis, site unspecified: Secondary | ICD-10-CM | POA: Diagnosis not present

## 2023-12-21 DIAGNOSIS — H6063 Unspecified chronic otitis externa, bilateral: Secondary | ICD-10-CM | POA: Diagnosis not present

## 2023-12-21 DIAGNOSIS — H903 Sensorineural hearing loss, bilateral: Secondary | ICD-10-CM | POA: Diagnosis not present

## 2023-12-22 DIAGNOSIS — R279 Unspecified lack of coordination: Secondary | ICD-10-CM | POA: Diagnosis not present

## 2023-12-22 DIAGNOSIS — M6259 Muscle wasting and atrophy, not elsewhere classified, multiple sites: Secondary | ICD-10-CM | POA: Diagnosis not present

## 2023-12-23 DIAGNOSIS — M4800 Spinal stenosis, site unspecified: Secondary | ICD-10-CM | POA: Diagnosis not present

## 2023-12-23 DIAGNOSIS — R278 Other lack of coordination: Secondary | ICD-10-CM | POA: Diagnosis not present

## 2023-12-23 DIAGNOSIS — M47819 Spondylosis without myelopathy or radiculopathy, site unspecified: Secondary | ICD-10-CM | POA: Diagnosis not present

## 2023-12-23 DIAGNOSIS — M47816 Spondylosis without myelopathy or radiculopathy, lumbar region: Secondary | ICD-10-CM | POA: Diagnosis not present

## 2023-12-26 DIAGNOSIS — R270 Ataxia, unspecified: Secondary | ICD-10-CM | POA: Diagnosis not present

## 2023-12-26 DIAGNOSIS — Z6823 Body mass index (BMI) 23.0-23.9, adult: Secondary | ICD-10-CM | POA: Diagnosis not present

## 2023-12-26 DIAGNOSIS — R2681 Unsteadiness on feet: Secondary | ICD-10-CM | POA: Diagnosis not present

## 2023-12-26 DIAGNOSIS — R432 Parageusia: Secondary | ICD-10-CM | POA: Diagnosis not present

## 2023-12-26 DIAGNOSIS — I10 Essential (primary) hypertension: Secondary | ICD-10-CM | POA: Diagnosis not present

## 2023-12-26 DIAGNOSIS — R3981 Functional urinary incontinence: Secondary | ICD-10-CM | POA: Diagnosis not present

## 2023-12-26 DIAGNOSIS — E10649 Type 1 diabetes mellitus with hypoglycemia without coma: Secondary | ICD-10-CM | POA: Diagnosis not present

## 2023-12-28 DIAGNOSIS — R278 Other lack of coordination: Secondary | ICD-10-CM | POA: Diagnosis not present

## 2023-12-28 DIAGNOSIS — M4800 Spinal stenosis, site unspecified: Secondary | ICD-10-CM | POA: Diagnosis not present

## 2023-12-28 DIAGNOSIS — M47819 Spondylosis without myelopathy or radiculopathy, site unspecified: Secondary | ICD-10-CM | POA: Diagnosis not present

## 2023-12-28 DIAGNOSIS — M47816 Spondylosis without myelopathy or radiculopathy, lumbar region: Secondary | ICD-10-CM | POA: Diagnosis not present

## 2023-12-30 DIAGNOSIS — R278 Other lack of coordination: Secondary | ICD-10-CM | POA: Diagnosis not present

## 2023-12-30 DIAGNOSIS — M47816 Spondylosis without myelopathy or radiculopathy, lumbar region: Secondary | ICD-10-CM | POA: Diagnosis not present

## 2023-12-30 DIAGNOSIS — M47819 Spondylosis without myelopathy or radiculopathy, site unspecified: Secondary | ICD-10-CM | POA: Diagnosis not present

## 2023-12-30 DIAGNOSIS — R279 Unspecified lack of coordination: Secondary | ICD-10-CM | POA: Diagnosis not present

## 2023-12-30 DIAGNOSIS — M4800 Spinal stenosis, site unspecified: Secondary | ICD-10-CM | POA: Diagnosis not present

## 2023-12-30 DIAGNOSIS — M6259 Muscle wasting and atrophy, not elsewhere classified, multiple sites: Secondary | ICD-10-CM | POA: Diagnosis not present

## 2024-01-02 DIAGNOSIS — R278 Other lack of coordination: Secondary | ICD-10-CM | POA: Diagnosis not present

## 2024-01-02 DIAGNOSIS — M47816 Spondylosis without myelopathy or radiculopathy, lumbar region: Secondary | ICD-10-CM | POA: Diagnosis not present

## 2024-01-02 DIAGNOSIS — M4800 Spinal stenosis, site unspecified: Secondary | ICD-10-CM | POA: Diagnosis not present

## 2024-01-02 DIAGNOSIS — M47819 Spondylosis without myelopathy or radiculopathy, site unspecified: Secondary | ICD-10-CM | POA: Diagnosis not present

## 2024-01-05 DIAGNOSIS — R279 Unspecified lack of coordination: Secondary | ICD-10-CM | POA: Diagnosis not present

## 2024-01-05 DIAGNOSIS — M6259 Muscle wasting and atrophy, not elsewhere classified, multiple sites: Secondary | ICD-10-CM | POA: Diagnosis not present

## 2024-01-09 DIAGNOSIS — M47819 Spondylosis without myelopathy or radiculopathy, site unspecified: Secondary | ICD-10-CM | POA: Diagnosis not present

## 2024-01-09 DIAGNOSIS — R278 Other lack of coordination: Secondary | ICD-10-CM | POA: Diagnosis not present

## 2024-01-09 DIAGNOSIS — E11319 Type 2 diabetes mellitus with unspecified diabetic retinopathy without macular edema: Secondary | ICD-10-CM | POA: Diagnosis not present

## 2024-01-09 DIAGNOSIS — M4800 Spinal stenosis, site unspecified: Secondary | ICD-10-CM | POA: Diagnosis not present

## 2024-01-09 DIAGNOSIS — M47816 Spondylosis without myelopathy or radiculopathy, lumbar region: Secondary | ICD-10-CM | POA: Diagnosis not present

## 2024-01-09 DIAGNOSIS — Z961 Presence of intraocular lens: Secondary | ICD-10-CM | POA: Diagnosis not present

## 2024-01-09 LAB — OPHTHALMOLOGY REPORT-SCANNED

## 2024-01-11 DIAGNOSIS — R278 Other lack of coordination: Secondary | ICD-10-CM | POA: Diagnosis not present

## 2024-01-11 DIAGNOSIS — M47816 Spondylosis without myelopathy or radiculopathy, lumbar region: Secondary | ICD-10-CM | POA: Diagnosis not present

## 2024-01-11 DIAGNOSIS — E1110 Type 2 diabetes mellitus with ketoacidosis without coma: Secondary | ICD-10-CM | POA: Diagnosis not present

## 2024-01-11 DIAGNOSIS — M47819 Spondylosis without myelopathy or radiculopathy, site unspecified: Secondary | ICD-10-CM | POA: Diagnosis not present

## 2024-01-11 DIAGNOSIS — M4800 Spinal stenosis, site unspecified: Secondary | ICD-10-CM | POA: Diagnosis not present

## 2024-01-12 DIAGNOSIS — R279 Unspecified lack of coordination: Secondary | ICD-10-CM | POA: Diagnosis not present

## 2024-01-12 DIAGNOSIS — M6259 Muscle wasting and atrophy, not elsewhere classified, multiple sites: Secondary | ICD-10-CM | POA: Diagnosis not present

## 2024-01-16 DIAGNOSIS — M47816 Spondylosis without myelopathy or radiculopathy, lumbar region: Secondary | ICD-10-CM | POA: Diagnosis not present

## 2024-01-16 DIAGNOSIS — R278 Other lack of coordination: Secondary | ICD-10-CM | POA: Diagnosis not present

## 2024-01-16 DIAGNOSIS — M4800 Spinal stenosis, site unspecified: Secondary | ICD-10-CM | POA: Diagnosis not present

## 2024-01-16 DIAGNOSIS — M47819 Spondylosis without myelopathy or radiculopathy, site unspecified: Secondary | ICD-10-CM | POA: Diagnosis not present

## 2024-01-17 DIAGNOSIS — M6259 Muscle wasting and atrophy, not elsewhere classified, multiple sites: Secondary | ICD-10-CM | POA: Diagnosis not present

## 2024-01-17 DIAGNOSIS — R279 Unspecified lack of coordination: Secondary | ICD-10-CM | POA: Diagnosis not present

## 2024-01-19 DIAGNOSIS — R278 Other lack of coordination: Secondary | ICD-10-CM | POA: Diagnosis not present

## 2024-01-19 DIAGNOSIS — M47819 Spondylosis without myelopathy or radiculopathy, site unspecified: Secondary | ICD-10-CM | POA: Diagnosis not present

## 2024-01-19 DIAGNOSIS — M4800 Spinal stenosis, site unspecified: Secondary | ICD-10-CM | POA: Diagnosis not present

## 2024-01-19 DIAGNOSIS — M47816 Spondylosis without myelopathy or radiculopathy, lumbar region: Secondary | ICD-10-CM | POA: Diagnosis not present

## 2024-01-19 DIAGNOSIS — R279 Unspecified lack of coordination: Secondary | ICD-10-CM | POA: Diagnosis not present

## 2024-01-19 DIAGNOSIS — M6259 Muscle wasting and atrophy, not elsewhere classified, multiple sites: Secondary | ICD-10-CM | POA: Diagnosis not present

## 2024-01-23 DIAGNOSIS — E875 Hyperkalemia: Secondary | ICD-10-CM | POA: Diagnosis not present

## 2024-01-23 DIAGNOSIS — E162 Hypoglycemia, unspecified: Secondary | ICD-10-CM | POA: Diagnosis not present

## 2024-01-23 DIAGNOSIS — R278 Other lack of coordination: Secondary | ICD-10-CM | POA: Diagnosis not present

## 2024-01-23 DIAGNOSIS — G3184 Mild cognitive impairment, so stated: Secondary | ICD-10-CM | POA: Diagnosis not present

## 2024-01-23 DIAGNOSIS — R270 Ataxia, unspecified: Secondary | ICD-10-CM | POA: Diagnosis not present

## 2024-01-23 DIAGNOSIS — E1043 Type 1 diabetes mellitus with diabetic autonomic (poly)neuropathy: Secondary | ICD-10-CM | POA: Diagnosis not present

## 2024-01-23 DIAGNOSIS — M4800 Spinal stenosis, site unspecified: Secondary | ICD-10-CM | POA: Diagnosis not present

## 2024-01-23 DIAGNOSIS — M47816 Spondylosis without myelopathy or radiculopathy, lumbar region: Secondary | ICD-10-CM | POA: Diagnosis not present

## 2024-01-23 DIAGNOSIS — M47819 Spondylosis without myelopathy or radiculopathy, site unspecified: Secondary | ICD-10-CM | POA: Diagnosis not present

## 2024-01-23 DIAGNOSIS — I10 Essential (primary) hypertension: Secondary | ICD-10-CM | POA: Diagnosis not present

## 2024-01-23 DIAGNOSIS — R748 Abnormal levels of other serum enzymes: Secondary | ICD-10-CM | POA: Diagnosis not present

## 2024-01-24 DIAGNOSIS — R279 Unspecified lack of coordination: Secondary | ICD-10-CM | POA: Diagnosis not present

## 2024-01-24 DIAGNOSIS — M6259 Muscle wasting and atrophy, not elsewhere classified, multiple sites: Secondary | ICD-10-CM | POA: Diagnosis not present

## 2024-01-25 DIAGNOSIS — E785 Hyperlipidemia, unspecified: Secondary | ICD-10-CM | POA: Diagnosis not present

## 2024-01-25 DIAGNOSIS — M6259 Muscle wasting and atrophy, not elsewhere classified, multiple sites: Secondary | ICD-10-CM | POA: Diagnosis not present

## 2024-01-25 DIAGNOSIS — R279 Unspecified lack of coordination: Secondary | ICD-10-CM | POA: Diagnosis not present

## 2024-01-26 DIAGNOSIS — M6259 Muscle wasting and atrophy, not elsewhere classified, multiple sites: Secondary | ICD-10-CM | POA: Diagnosis not present

## 2024-01-26 DIAGNOSIS — M47819 Spondylosis without myelopathy or radiculopathy, site unspecified: Secondary | ICD-10-CM | POA: Diagnosis not present

## 2024-01-26 DIAGNOSIS — R279 Unspecified lack of coordination: Secondary | ICD-10-CM | POA: Diagnosis not present

## 2024-01-26 DIAGNOSIS — M4800 Spinal stenosis, site unspecified: Secondary | ICD-10-CM | POA: Diagnosis not present

## 2024-01-26 DIAGNOSIS — M47816 Spondylosis without myelopathy or radiculopathy, lumbar region: Secondary | ICD-10-CM | POA: Diagnosis not present

## 2024-01-26 DIAGNOSIS — R278 Other lack of coordination: Secondary | ICD-10-CM | POA: Diagnosis not present

## 2024-01-27 DIAGNOSIS — M6259 Muscle wasting and atrophy, not elsewhere classified, multiple sites: Secondary | ICD-10-CM | POA: Diagnosis not present

## 2024-01-27 DIAGNOSIS — Z23 Encounter for immunization: Secondary | ICD-10-CM | POA: Diagnosis not present

## 2024-01-27 DIAGNOSIS — R279 Unspecified lack of coordination: Secondary | ICD-10-CM | POA: Diagnosis not present

## 2024-01-30 DIAGNOSIS — R296 Repeated falls: Secondary | ICD-10-CM | POA: Diagnosis not present

## 2024-01-30 DIAGNOSIS — B351 Tinea unguium: Secondary | ICD-10-CM | POA: Diagnosis not present

## 2024-01-30 DIAGNOSIS — M4800 Spinal stenosis, site unspecified: Secondary | ICD-10-CM | POA: Diagnosis not present

## 2024-01-30 DIAGNOSIS — M47816 Spondylosis without myelopathy or radiculopathy, lumbar region: Secondary | ICD-10-CM | POA: Diagnosis not present

## 2024-01-30 DIAGNOSIS — R278 Other lack of coordination: Secondary | ICD-10-CM | POA: Diagnosis not present

## 2024-01-30 DIAGNOSIS — M47819 Spondylosis without myelopathy or radiculopathy, site unspecified: Secondary | ICD-10-CM | POA: Diagnosis not present

## 2024-01-30 DIAGNOSIS — E10649 Type 1 diabetes mellitus with hypoglycemia without coma: Secondary | ICD-10-CM | POA: Diagnosis not present

## 2024-01-31 DIAGNOSIS — M6259 Muscle wasting and atrophy, not elsewhere classified, multiple sites: Secondary | ICD-10-CM | POA: Diagnosis not present

## 2024-01-31 DIAGNOSIS — M47816 Spondylosis without myelopathy or radiculopathy, lumbar region: Secondary | ICD-10-CM | POA: Diagnosis not present

## 2024-01-31 DIAGNOSIS — M4800 Spinal stenosis, site unspecified: Secondary | ICD-10-CM | POA: Diagnosis not present

## 2024-01-31 DIAGNOSIS — R279 Unspecified lack of coordination: Secondary | ICD-10-CM | POA: Diagnosis not present

## 2024-01-31 DIAGNOSIS — R278 Other lack of coordination: Secondary | ICD-10-CM | POA: Diagnosis not present

## 2024-01-31 DIAGNOSIS — M47819 Spondylosis without myelopathy or radiculopathy, site unspecified: Secondary | ICD-10-CM | POA: Diagnosis not present

## 2024-02-01 DIAGNOSIS — M6259 Muscle wasting and atrophy, not elsewhere classified, multiple sites: Secondary | ICD-10-CM | POA: Diagnosis not present

## 2024-02-01 DIAGNOSIS — R279 Unspecified lack of coordination: Secondary | ICD-10-CM | POA: Diagnosis not present

## 2024-02-02 DIAGNOSIS — M47819 Spondylosis without myelopathy or radiculopathy, site unspecified: Secondary | ICD-10-CM | POA: Diagnosis not present

## 2024-02-02 DIAGNOSIS — R278 Other lack of coordination: Secondary | ICD-10-CM | POA: Diagnosis not present

## 2024-02-02 DIAGNOSIS — M47816 Spondylosis without myelopathy or radiculopathy, lumbar region: Secondary | ICD-10-CM | POA: Diagnosis not present

## 2024-02-02 DIAGNOSIS — R279 Unspecified lack of coordination: Secondary | ICD-10-CM | POA: Diagnosis not present

## 2024-02-02 DIAGNOSIS — M6259 Muscle wasting and atrophy, not elsewhere classified, multiple sites: Secondary | ICD-10-CM | POA: Diagnosis not present

## 2024-02-02 DIAGNOSIS — M4800 Spinal stenosis, site unspecified: Secondary | ICD-10-CM | POA: Diagnosis not present

## 2024-02-03 DIAGNOSIS — R279 Unspecified lack of coordination: Secondary | ICD-10-CM | POA: Diagnosis not present

## 2024-02-03 DIAGNOSIS — M6259 Muscle wasting and atrophy, not elsewhere classified, multiple sites: Secondary | ICD-10-CM | POA: Diagnosis not present

## 2024-02-06 DIAGNOSIS — R296 Repeated falls: Secondary | ICD-10-CM | POA: Diagnosis not present

## 2024-02-06 DIAGNOSIS — E10649 Type 1 diabetes mellitus with hypoglycemia without coma: Secondary | ICD-10-CM | POA: Diagnosis not present

## 2024-02-06 DIAGNOSIS — R278 Other lack of coordination: Secondary | ICD-10-CM | POA: Diagnosis not present

## 2024-02-06 DIAGNOSIS — M47816 Spondylosis without myelopathy or radiculopathy, lumbar region: Secondary | ICD-10-CM | POA: Diagnosis not present

## 2024-02-06 DIAGNOSIS — M4800 Spinal stenosis, site unspecified: Secondary | ICD-10-CM | POA: Diagnosis not present

## 2024-02-06 DIAGNOSIS — E162 Hypoglycemia, unspecified: Secondary | ICD-10-CM | POA: Diagnosis not present

## 2024-02-06 DIAGNOSIS — M6259 Muscle wasting and atrophy, not elsewhere classified, multiple sites: Secondary | ICD-10-CM | POA: Diagnosis not present

## 2024-02-06 DIAGNOSIS — M47819 Spondylosis without myelopathy or radiculopathy, site unspecified: Secondary | ICD-10-CM | POA: Diagnosis not present

## 2024-02-06 DIAGNOSIS — R279 Unspecified lack of coordination: Secondary | ICD-10-CM | POA: Diagnosis not present

## 2024-02-07 DIAGNOSIS — R279 Unspecified lack of coordination: Secondary | ICD-10-CM | POA: Diagnosis not present

## 2024-02-07 DIAGNOSIS — M47819 Spondylosis without myelopathy or radiculopathy, site unspecified: Secondary | ICD-10-CM | POA: Diagnosis not present

## 2024-02-07 DIAGNOSIS — M6259 Muscle wasting and atrophy, not elsewhere classified, multiple sites: Secondary | ICD-10-CM | POA: Diagnosis not present

## 2024-02-07 DIAGNOSIS — R278 Other lack of coordination: Secondary | ICD-10-CM | POA: Diagnosis not present

## 2024-02-07 DIAGNOSIS — M4800 Spinal stenosis, site unspecified: Secondary | ICD-10-CM | POA: Diagnosis not present

## 2024-02-07 DIAGNOSIS — M47816 Spondylosis without myelopathy or radiculopathy, lumbar region: Secondary | ICD-10-CM | POA: Diagnosis not present

## 2024-02-13 DIAGNOSIS — R296 Repeated falls: Secondary | ICD-10-CM | POA: Diagnosis not present

## 2024-02-13 DIAGNOSIS — E10649 Type 1 diabetes mellitus with hypoglycemia without coma: Secondary | ICD-10-CM | POA: Diagnosis not present

## 2024-02-20 ENCOUNTER — Encounter: Payer: Self-pay | Admitting: Family Medicine

## 2024-02-20 DIAGNOSIS — E10649 Type 1 diabetes mellitus with hypoglycemia without coma: Secondary | ICD-10-CM | POA: Diagnosis not present

## 2024-02-20 DIAGNOSIS — Z1331 Encounter for screening for depression: Secondary | ICD-10-CM | POA: Diagnosis not present

## 2024-02-20 DIAGNOSIS — M810 Age-related osteoporosis without current pathological fracture: Secondary | ICD-10-CM | POA: Diagnosis not present

## 2024-02-20 DIAGNOSIS — S8001XA Contusion of right knee, initial encounter: Secondary | ICD-10-CM | POA: Diagnosis not present

## 2024-02-20 DIAGNOSIS — R296 Repeated falls: Secondary | ICD-10-CM | POA: Diagnosis not present

## 2024-02-22 DIAGNOSIS — G629 Polyneuropathy, unspecified: Secondary | ICD-10-CM | POA: Diagnosis not present

## 2024-02-22 DIAGNOSIS — R200 Anesthesia of skin: Secondary | ICD-10-CM | POA: Diagnosis not present

## 2024-02-22 DIAGNOSIS — M62838 Other muscle spasm: Secondary | ICD-10-CM | POA: Diagnosis not present

## 2024-02-22 DIAGNOSIS — M542 Cervicalgia: Secondary | ICD-10-CM | POA: Diagnosis not present

## 2024-02-22 DIAGNOSIS — G119 Hereditary ataxia, unspecified: Secondary | ICD-10-CM | POA: Diagnosis not present

## 2024-02-22 DIAGNOSIS — R2681 Unsteadiness on feet: Secondary | ICD-10-CM | POA: Diagnosis not present

## 2024-02-22 DIAGNOSIS — G3281 Cerebellar ataxia in diseases classified elsewhere: Secondary | ICD-10-CM | POA: Diagnosis not present

## 2024-02-22 DIAGNOSIS — M6289 Other specified disorders of muscle: Secondary | ICD-10-CM | POA: Diagnosis not present

## 2024-02-22 DIAGNOSIS — R2689 Other abnormalities of gait and mobility: Secondary | ICD-10-CM | POA: Diagnosis not present

## 2024-02-22 DIAGNOSIS — R296 Repeated falls: Secondary | ICD-10-CM | POA: Diagnosis not present

## 2024-02-24 ENCOUNTER — Other Ambulatory Visit: Payer: Self-pay | Admitting: Family Medicine

## 2024-02-24 DIAGNOSIS — N3941 Urge incontinence: Secondary | ICD-10-CM

## 2024-02-24 MED ORDER — FESOTERODINE FUMARATE ER 4 MG PO TB24: 8.0000 mg | ORAL_TABLET | Freq: Every day | ORAL | 3 refills | Status: DC

## 2024-03-02 ENCOUNTER — Ambulatory Visit: Admitting: Podiatry

## 2024-03-02 ENCOUNTER — Telehealth: Payer: Self-pay

## 2024-03-02 ENCOUNTER — Other Ambulatory Visit (HOSPITAL_COMMUNITY): Payer: Self-pay

## 2024-03-02 DIAGNOSIS — M79675 Pain in left toe(s): Secondary | ICD-10-CM

## 2024-03-02 DIAGNOSIS — M79674 Pain in right toe(s): Secondary | ICD-10-CM

## 2024-03-02 DIAGNOSIS — E1042 Type 1 diabetes mellitus with diabetic polyneuropathy: Secondary | ICD-10-CM | POA: Diagnosis not present

## 2024-03-02 DIAGNOSIS — B351 Tinea unguium: Secondary | ICD-10-CM | POA: Diagnosis not present

## 2024-03-02 DIAGNOSIS — N3941 Urge incontinence: Secondary | ICD-10-CM

## 2024-03-02 NOTE — Telephone Encounter (Signed)
 Pharmacy Patient Advocate Encounter   Received notification from Onbase that prior authorization for Toviaz  4 is required/requested.   Insurance verification completed.   The patient is insured through [place].   Per test claim: PA required; PA submitted to above mentioned insurance via Latent Key/confirmation #/EOC [id number] Status is pending

## 2024-03-05 ENCOUNTER — Other Ambulatory Visit (HOSPITAL_COMMUNITY): Payer: Self-pay

## 2024-03-05 MED ORDER — FESOTERODINE FUMARATE ER 8 MG PO TB24: 8.0000 mg | ORAL_TABLET | Freq: Every day | ORAL | 2 refills | Status: AC

## 2024-03-05 NOTE — Telephone Encounter (Signed)
 Pharmacy Patient Advocate Encounter  Received notification from [employer] that Prior Authorization for Fesoterodine  er 4mg  tablets  has been closesd   PA #/Case ID/Reference #: [id number]  It is rejecting due to plan limits exceeded. Can the prescription to resent as 1 8mg  er tablet daily vs 2 4mg  er tablets to allow for insurance coverage?

## 2024-03-05 NOTE — Telephone Encounter (Signed)
 8 mg tabs sent to pharmacy

## 2024-03-06 ENCOUNTER — Encounter: Payer: Self-pay | Admitting: Podiatry

## 2024-03-06 NOTE — Progress Notes (Signed)
"  °  Subjective:  Patient ID: Joyce Brown, female    DOB: 10-20-1950,  MRN: [id number]  Joyce Brown presents to clinic today for at risk foot care with history of diabetic neuropathy and painful mycotic toenails of both feet that are difficult to trim. Pain interferes with daily activities and wearing enclosed shoe gear comfortably. She is a resident of [facility]. Chief Complaint  Patient presents with   Diabetes    A1c 7 by report. She saw Dr. Sharma over the summer   New problem(s): None.   PCP is Simmons-Robinson, Makiera, MD.  Allergies[1]  Review of Systems: Negative except as noted in the HPI.  Objective: No changes noted in today's physical examination. There were no vitals filed for this visit. Joyce Brown is a pleasant 73 y.o. female WD, WN in NAD. AAO x 3.  Vascular Examination: Capillary refill time immediate b/l. Vascular status intact b/l with palpable pedal pulses. Pedal hair present b/l. No pain with calf compression b/l. Skin temperature gradient WNL b/l. No cyanosis or clubbing b/l. No ischemia or gangrene noted b/l.   Neurological Examination: Sensation grossly intact b/l with 10 gram monofilament. Vibratory sensation intact b/l. Pt has subjective symptoms of neuropathy.  Dermatological Examination: Pedal skin with normal turgor, texture and tone b/l.  No open wounds. No interdigital macerations.   Toenails 1-5 b/l thick, discolored, elongated with subungual debris and pain on dorsal palpation.   No corns, calluses nor porokeratotic lesions noted.  Musculoskeletal Examination: Muscle strength 5/5 to all lower extremity muscle groups bilaterally. No pain, crepitus or joint limitation noted with ROM bilateral LE. Hammertoe deformity noted 2-5 b/l. Utilizes wheelchair for mobility assistance.  Assessment/Plan: 1. Pain due to onychomycosis of toenails of both feet   2. Diabetic polyneuropathy associated with type  1 diabetes mellitus (HCC)   Patient was evaluated and treated. All patient's and/or POA's questions/concerns addressed on today's visit. Mycotic toenails 1-5 b/l debrided in length and girth without incident.  Continue daily foot inspections and monitor blood glucose per PCP/Endocrinologist's recommendations.Continue soft, supportive shoe gear daily. Report any pedal injuries to medical professional. Call office if there are any quesitons/concerns. -Patient/POA to call should there be question/concern in the interim.   Return in about 3 months (around 05/31/2024).  Delon LITTIE Merlin, DPM      [place] LOCATION: [address] [address] [place] [address]                   Office [phone]   [facility] LOCATION: [address] [place], [place] [address] Office [phone]     [1] No Known Allergies  "

## 2024-03-08 ENCOUNTER — Observation Stay
Admission: EM | Admit: 2024-03-08 | Discharge: 2024-03-10 | Disposition: A | Discharge status: Home / Self Care | Attending: Emergency Medicine | Admitting: Emergency Medicine

## 2024-03-08 ENCOUNTER — Other Ambulatory Visit: Payer: Self-pay

## 2024-03-08 DIAGNOSIS — Z794 Long term (current) use of insulin: Secondary | ICD-10-CM | POA: Insufficient documentation

## 2024-03-08 DIAGNOSIS — E785 Hyperlipidemia, unspecified: Secondary | ICD-10-CM | POA: Diagnosis not present

## 2024-03-08 DIAGNOSIS — Z7982 Long term (current) use of aspirin: Secondary | ICD-10-CM | POA: Diagnosis not present

## 2024-03-08 DIAGNOSIS — E1165 Type 2 diabetes mellitus with hyperglycemia: Secondary | ICD-10-CM | POA: Diagnosis not present

## 2024-03-08 DIAGNOSIS — J45909 Unspecified asthma, uncomplicated: Secondary | ICD-10-CM | POA: Insufficient documentation

## 2024-03-08 DIAGNOSIS — Z86711 Personal history of pulmonary embolism: Secondary | ICD-10-CM | POA: Diagnosis not present

## 2024-03-08 DIAGNOSIS — M797 Fibromyalgia: Secondary | ICD-10-CM

## 2024-03-08 DIAGNOSIS — F32A Depression, unspecified: Secondary | ICD-10-CM | POA: Diagnosis not present

## 2024-03-08 DIAGNOSIS — E162 Hypoglycemia, unspecified: Secondary | ICD-10-CM | POA: Diagnosis present

## 2024-03-08 DIAGNOSIS — Z79899 Other long term (current) drug therapy: Secondary | ICD-10-CM | POA: Diagnosis not present

## 2024-03-08 DIAGNOSIS — E119 Type 2 diabetes mellitus without complications: Secondary | ICD-10-CM | POA: Insufficient documentation

## 2024-03-08 DIAGNOSIS — E1142 Type 2 diabetes mellitus with diabetic polyneuropathy: Secondary | ICD-10-CM | POA: Diagnosis not present

## 2024-03-08 DIAGNOSIS — E11649 Type 2 diabetes mellitus with hypoglycemia without coma: Principal | ICD-10-CM | POA: Insufficient documentation

## 2024-03-08 DIAGNOSIS — E1042 Type 1 diabetes mellitus with diabetic polyneuropathy: Secondary | ICD-10-CM | POA: Diagnosis present

## 2024-03-08 DIAGNOSIS — L439 Lichen planus, unspecified: Secondary | ICD-10-CM

## 2024-03-08 DIAGNOSIS — I10 Essential (primary) hypertension: Secondary | ICD-10-CM | POA: Insufficient documentation

## 2024-03-08 DIAGNOSIS — E875 Hyperkalemia: Secondary | ICD-10-CM

## 2024-03-08 DIAGNOSIS — N3941 Urge incontinence: Secondary | ICD-10-CM

## 2024-03-08 LAB — COMPREHENSIVE METABOLIC PANEL WITH GFR
ALT: 48 U/L — ABNORMAL HIGH (ref 0–44)
AST: 45 U/L — ABNORMAL HIGH (ref 15–41)
Albumin: 3.7 g/dL (ref 3.5–5.0)
Alkaline Phosphatase: 102 U/L (ref 38–126)
Anion gap: 11 (ref 5–15)
BUN: 19 mg/dL (ref 8–23)
CO2: 27 mmol/L (ref 22–32)
Calcium: 9.2 mg/dL (ref 8.9–10.3)
Chloride: 105 mmol/L (ref 98–111)
Creatinine, Ser: 0.95 mg/dL (ref 0.44–1.00)
GFR, Estimated: >60 mL/min
Glucose, Bld: 205 mg/dL — ABNORMAL HIGH (ref 70–99)
Potassium: 5.4 mmol/L — ABNORMAL HIGH (ref 3.5–5.1)
Sodium: 142 mmol/L (ref 135–145)
Total Bilirubin: <0.2 mg/dL (ref 0.0–1.2)
Total Protein: 6.4 g/dL — ABNORMAL LOW (ref 6.5–8.1)

## 2024-03-08 LAB — CBC WITH DIFFERENTIAL/PLATELET
Abs Immature Granulocytes: 0.05 K/uL (ref 0.00–0.07)
Basophils Absolute: 0.1 K/uL (ref 0.0–0.1)
Basophils Relative: 1 %
Eosinophils Absolute: 0.3 K/uL (ref 0.0–0.5)
Eosinophils Relative: 3 %
HCT: 36.2 % (ref 36.0–46.0)
Hemoglobin: 11.3 g/dL — ABNORMAL LOW (ref 12.0–15.0)
Immature Granulocytes: 1 %
Lymphocytes Relative: 18 %
Lymphs Abs: 1.6 K/uL (ref 0.7–4.0)
MCH: 30.7 pg (ref 26.0–34.0)
MCHC: 31.2 g/dL (ref 30.0–36.0)
MCV: 98.4 fL (ref 80.0–100.0)
Monocytes Absolute: 0.9 K/uL (ref 0.1–1.0)
Monocytes Relative: 10 %
Neutro Abs: 6.1 K/uL (ref 1.7–7.7)
Neutrophils Relative %: 67 %
Platelets: 322 K/uL (ref 150–400)
RBC: 3.68 MIL/uL — ABNORMAL LOW (ref 3.87–5.11)
RDW: 14.5 % (ref 11.5–15.5)
WBC: 9.1 K/uL (ref 4.0–10.5)
nRBC: 0 % (ref 0.0–0.2)

## 2024-03-08 LAB — CBG MONITORING, ED
Glucose-Capillary: 30 mg/dL — CL (ref 70–99)
Glucose-Capillary: 62 mg/dL — ABNORMAL LOW (ref 70–99)
Glucose-Capillary: 251 mg/dL — ABNORMAL HIGH (ref 70–99)
Glucose-Capillary: 270 mg/dL — ABNORMAL HIGH (ref 70–99)

## 2024-03-08 MED ORDER — ESTRADIOL 0.01 % VA CREA
1.0000 | TOPICAL_CREAM | VAGINAL | Status: DC
  Filled 2024-03-08: qty 42.5

## 2024-03-08 MED ORDER — VITAMIN D 25 MCG (1000 UNIT) PO TABS
1000.0000 [IU] | ORAL_TABLET | Freq: Every day | ORAL | Status: DC
  Administered 2024-03-09 – 2024-03-10 (×2): 1000 [IU] via ORAL
  Filled 2024-03-08 (×2): qty 1

## 2024-03-08 MED ORDER — SODIUM ZIRCONIUM CYCLOSILICATE 5 G PO PACK
5.0000 g | PACK | Freq: Once | ORAL | Status: DC
  Filled 2024-03-08: qty 1

## 2024-03-08 MED ORDER — GLUCAGON 3 MG/DOSE NA POWD: 3.0000 mg | NASAL | Status: DC | PRN

## 2024-03-08 MED ORDER — INSULIN ASPART 100 UNIT/ML IJ SOLN
0.0000 [IU] | Freq: Every day | INTRAMUSCULAR | Status: DC
  Administered 2024-03-09: 2 [IU] via SUBCUTANEOUS
  Filled 2024-03-08: qty 2

## 2024-03-08 MED ORDER — TRAZODONE HCL 50 MG PO TABS: 25.0000 mg | ORAL_TABLET | Freq: Every evening | ORAL | Status: DC | PRN

## 2024-03-08 MED ORDER — ATORVASTATIN CALCIUM 20 MG PO TABS
80.0000 mg | ORAL_TABLET | Freq: Every day | ORAL | Status: DC
  Administered 2024-03-09 – 2024-03-10 (×2): 80 mg via ORAL
  Filled 2024-03-08 (×2): qty 4

## 2024-03-08 MED ORDER — INSULIN ASPART 100 UNIT/ML IJ SOLN
0.0000 [IU] | Freq: Three times a day (TID) | INTRAMUSCULAR | Status: DC
  Administered 2024-03-09: 9 [IU] via SUBCUTANEOUS
  Administered 2024-03-09: 7 [IU] via SUBCUTANEOUS
  Administered 2024-03-09: 3 [IU] via SUBCUTANEOUS
  Administered 2024-03-10: 2 [IU] via SUBCUTANEOUS
  Filled 2024-03-08: qty 2
  Filled 2024-03-08: qty 9
  Filled 2024-03-08: qty 7
  Filled 2024-03-08: qty 3

## 2024-03-08 MED ORDER — MAGNESIUM HYDROXIDE 400 MG/5ML PO SUSP: 30.0000 mL | Freq: Every day | ORAL | Status: DC | PRN

## 2024-03-08 MED ORDER — LOSARTAN POTASSIUM 50 MG PO TABS
25.0000 mg | ORAL_TABLET | Freq: Every day | ORAL | Status: DC
  Administered 2024-03-09: 25 mg via ORAL
  Filled 2024-03-08: qty 1

## 2024-03-08 MED ORDER — VITAMIN B-12 100 MCG PO TABS
100.0000 ug | ORAL_TABLET | Freq: Every day | ORAL | Status: DC
  Administered 2024-03-09 – 2024-03-10 (×2): 100 ug via ORAL
  Filled 2024-03-08 (×2): qty 1

## 2024-03-08 MED ORDER — FESOTERODINE FUMARATE ER 4 MG PO TB24
8.0000 mg | ORAL_TABLET | Freq: Every day | ORAL | Status: DC
  Administered 2024-03-09 – 2024-03-10 (×2): 8 mg via ORAL
  Filled 2024-03-08 (×2): qty 2
  Filled 2024-03-08: qty 1

## 2024-03-08 MED ORDER — ACETAMINOPHEN 325 MG PO TABS: 650.0000 mg | ORAL_TABLET | Freq: Four times a day (QID) | ORAL | Status: DC | PRN

## 2024-03-08 MED ORDER — DEXTROSE 50 % IV SOLN
INTRAVENOUS | Status: AC
  Administered 2024-03-08: 50 mL
  Filled 2024-03-08: qty 50

## 2024-03-08 MED ORDER — ENOXAPARIN SODIUM 40 MG/0.4ML IJ SOSY
40.0000 mg | PREFILLED_SYRINGE | INTRAMUSCULAR | Status: DC
  Administered 2024-03-09 – 2024-03-10 (×2): 40 mg via SUBCUTANEOUS
  Filled 2024-03-08 (×2): qty 0.4

## 2024-03-08 MED ORDER — GABAPENTIN 300 MG PO CAPS
300.0000 mg | ORAL_CAPSULE | Freq: Three times a day (TID) | ORAL | Status: DC
  Administered 2024-03-09 – 2024-03-10 (×4): 300 mg via ORAL
  Filled 2024-03-08 (×4): qty 1

## 2024-03-08 MED ORDER — DEXTROSE-SODIUM CHLORIDE 5-0.45 % IV SOLN: INTRAVENOUS | Status: DC

## 2024-03-08 MED ORDER — OMEGA-3-ACID ETHYL ESTERS 1 G PO CAPS
1.0000 g | ORAL_CAPSULE | Freq: Every day | ORAL | Status: DC
  Administered 2024-03-09 – 2024-03-10 (×2): 1 g via ORAL
  Filled 2024-03-08 (×2): qty 1

## 2024-03-08 MED ORDER — FESOTERODINE FUMARATE ER 4 MG PO TB24: 4.0000 mg | ORAL_TABLET | Freq: Every day | ORAL | Status: DC

## 2024-03-08 MED ORDER — TIZANIDINE HCL 2 MG PO TABS: 2.0000 mg | ORAL_TABLET | Freq: Every evening | ORAL | Status: DC | PRN

## 2024-03-08 MED ORDER — OMEGA 3-6-9 PO CAPS: 1.0000 | ORAL_CAPSULE | Freq: Every day | ORAL | Status: DC

## 2024-03-08 MED ORDER — FERROUS SULFATE 325 (65 FE) MG PO TABS
325.0000 mg | ORAL_TABLET | Freq: Every day | ORAL | Status: DC
  Administered 2024-03-09 – 2024-03-10 (×2): 325 mg via ORAL
  Filled 2024-03-08 (×2): qty 1

## 2024-03-08 MED ORDER — ASPIRIN 81 MG PO TBEC
81.0000 mg | DELAYED_RELEASE_TABLET | Freq: Every day | ORAL | Status: DC
  Administered 2024-03-09 – 2024-03-10 (×2): 81 mg via ORAL
  Filled 2024-03-08 (×2): qty 1

## 2024-03-08 MED ORDER — DEXTROSE 10 % IV BOLUS
250.0000 mL | Freq: Once | INTRAVENOUS | Status: DC
  Filled 2024-03-08: qty 500

## 2024-03-08 MED ORDER — ONDANSETRON HCL 4 MG/2ML IJ SOLN: 4.0000 mg | Freq: Four times a day (QID) | INTRAMUSCULAR | Status: DC | PRN

## 2024-03-08 MED ORDER — ADULT MULTIVITAMIN W/MINERALS CH
1.0000 | ORAL_TABLET | Freq: Every day | ORAL | Status: DC
  Administered 2024-03-09 – 2024-03-10 (×3): 1 via ORAL
  Filled 2024-03-08 (×3): qty 1

## 2024-03-08 MED ORDER — ONDANSETRON HCL 4 MG PO TABS: 4.0000 mg | ORAL_TABLET | Freq: Four times a day (QID) | ORAL | Status: DC | PRN

## 2024-03-08 MED ORDER — SERTRALINE HCL 50 MG PO TABS
100.0000 mg | ORAL_TABLET | Freq: Every day | ORAL | Status: DC
  Administered 2024-03-09 – 2024-03-10 (×2): 100 mg via ORAL
  Filled 2024-03-08 (×2): qty 2

## 2024-03-08 MED ORDER — ACETAMINOPHEN 650 MG RE SUPP: 650.0000 mg | Freq: Four times a day (QID) | RECTAL | Status: DC | PRN

## 2024-03-08 NOTE — Assessment & Plan Note (Signed)
-   Will continue antihypertensive therapy.

## 2024-03-08 NOTE — Assessment & Plan Note (Signed)
Will continue Zoloft. 

## 2024-03-08 NOTE — ED Notes (Signed)
 Telephone report called to Fleming, RN on 1C who will be receiving pt to room 105A.

## 2024-03-08 NOTE — Assessment & Plan Note (Addendum)
-   This could be related to malfunction of her OmniPod though the patient believes that it was related to delayed dinner in her ALF. - She will be admitted to a telemetry observation bed. - Will continue on D5 half normal saline at 75 mL/h. - Hypoglycemia protocol will be followed. - She will be placed on supplemental coverage with sensitive level NovoLog .

## 2024-03-08 NOTE — ED Provider Notes (Signed)
 Joyce Brown Provider Note    Event Date/Time   First MD Initiated Contact with Patient 03/08/24 1847     (approximate)   History   Hypoglycemia   HPI  Joyce Brown is a 73 y.o. female with history of diabetes on insulin , asthma, depression, GERD, presenting with hypoglycemia.  Patient states that she was feeling fine today, denies any recent changes to her OmniPod medications.  States that she is been monitoring her insulin  and glucose on her phone.  Was found at her facility slumped over in a wheelchair, found a glucose of 44.  When EMS got there, they gave her D10 bolus, recheck of her blood glucose was found to be in the 30s.  On our recheck is in the 30s.  Patient denies any chest pain or shortness of breath, no urinary symptoms.  States that she felt a little bit lightheaded and sweaty this evening before the staff at her facility found her.  States that she feels fine now.  She does not have her phone with her at this time, unable to turn off her OmniPod.  Independent history obtained from EMS as above.  On independent review, she was seen by endocrine on 22 December, has history of type 1 diabetes, on insulin , has an OmniPod in auto mode.     Physical Exam   Triage Vital Signs: ED Triage Vitals  Encounter Vitals Group     BP      Girls Systolic BP Percentile      Girls Diastolic BP Percentile      Boys Systolic BP Percentile      Boys Diastolic BP Percentile      Pulse      Resp      Temp      Temp src      SpO2      Weight      Height      Head Circumference      Peak Flow      Pain Score      Pain Loc      Pain Education      Exclude from Growth Chart     Most recent vital signs: Vitals:   03/08/24 1915 03/08/24 1918  BP: (!) 135/57   Pulse: 62   Resp:  16  SpO2: 99%      General: Awake, no distress.  CV:  Good peripheral perfusion.  Resp:  Normal effort.  No tachypnea or respiratory distress Abd:  No distention.   Soft nontender, OmniPod with sensor at her lower abdomen. Other:  Lower extremity edema, no focal weakness or numbness, no facial droop or slurred speech.   ED Results / Procedures / Treatments   Labs (all labs ordered are listed, but only abnormal results are displayed) Labs Reviewed  COMPREHENSIVE METABOLIC PANEL WITH GFR - Abnormal; Notable for the following components:      Result Value   Potassium 5.4 (*)    Glucose, Bld 205 (*)    Total Protein 6.4 (*)    AST 45 (*)    ALT 48 (*)    All other components within normal limits  CBC WITH DIFFERENTIAL/PLATELET - Abnormal; Notable for the following components:   RBC 3.68 (*)    Hemoglobin 11.3 (*)    All other components within normal limits  CBG MONITORING, ED - Abnormal; Notable for the following components:   Glucose-Capillary 30 (*)    All other components within  normal limits  CBG MONITORING, ED - Abnormal; Notable for the following components:   Glucose-Capillary 62 (*)    All other components within normal limits  CBG MONITORING, ED - Abnormal; Notable for the following components:   Glucose-Capillary 251 (*)    All other components within normal limits  CBG MONITORING, ED - Abnormal; Notable for the following components:   Glucose-Capillary 270 (*)    All other components within normal limits  URINALYSIS, W/ REFLEX TO CULTURE (INFECTION SUSPECTED)       PROCEDURES:  Critical Care performed: Yes, see critical care procedure note(s)  .Critical Care  Performed by: Waymond Lorelle Cummins, MD Authorized by: Waymond Lorelle Cummins, MD   Critical care provider statement:    Critical care time (minutes):  40   Critical care was necessary to treat or prevent imminent or life-threatening deterioration of the following conditions:  Endocrine crisis   Critical care was time spent personally by me on the following activities:  Development of treatment plan with patient or surrogate, discussions with consultants, evaluation of patient's  response to treatment, examination of patient, ordering and review of laboratory studies, ordering and review of radiographic studies, ordering and performing treatments and interventions, pulse oximetry, re-evaluation of patient's condition and review of old charts    MEDICATIONS ORDERED IN ED: Medications  dextrose  50 % solution (50 mLs  Given 03/08/24 1905)     IMPRESSION / MDM / ASSESSMENT AND PLAN / ED COURSE  I reviewed the triage vital signs and the nursing notes.                              Differential diagnosis includes, but is not limited to, hypoglycemia, could be medication side effect, OmniPod malfunction, occult infection.  She is not on any sulfonylurea.  Will plan to remove the OmniPod.  Labs, UA, will give her a bolus of D10 and reassess.  Patient's presentation is most consistent with acute presentation with potential threat to life or bodily function.  Independent interpretation of labs and imaging below.  Clinical course as below.  Given her hypoglycemia, suspecting OmniPod malfunction, will plan to bring her in for further assessment and adjustments of her insulin .  Consulted hospitalist will admit the patient.  She is admitted.    Clinical Course as of 03/08/24 2152  Thu Mar 08, 2024  1938 Repeat glucose after D50 is 62.  Patient is eating at this time. [TT]  2130 Independent review of labs, potassium is mildly elevated, rest of electrolytes really deranged, creatinine is not elevated, AST and ALT are mildly elevated, no leukocytosis. [TT]  2130 Repeat glucose after eating is 251. [TT]    Clinical Course User Index [TT] Waymond, Lorelle Cummins, MD     FINAL CLINICAL IMPRESSION(S) / ED DIAGNOSES   Final diagnoses:  Hypoglycemia     Rx / DC Orders   ED Discharge Orders     None        Note:  This document was prepared using Dragon voice recognition software and may include unintentional dictation errors.    Waymond Lorelle Cummins, MD 03/08/24 [phone]

## 2024-03-08 NOTE — ED Notes (Signed)
 This RN fed pt a medium sized slice of Pumpkin pie while dayshift RN Fredderick gave pt an amp of D50.  Will reassess pts blood glucose to ensure correction of pts low blood glucose levels.

## 2024-03-08 NOTE — ED Notes (Signed)
 With pt's permission, called pts sister Carle and gave her an update regarding pts visit to the ER today and her admission to the hospital for low blood sugar.

## 2024-03-08 NOTE — Assessment & Plan Note (Signed)
-   Will place her on a small dose of p.o. Lokelma .

## 2024-03-08 NOTE — Assessment & Plan Note (Signed)
-   The patient will be placed on supplemental coverage with NovoLog  sensitive protocol as mentioned above. - Long-acting insulin  will certainly be held off given her hypoglycemia. - Will continue Neurontin .

## 2024-03-08 NOTE — ED Triage Notes (Signed)
 Pt arriving via EMS from [facility]. EMS reports pt was in wheel chair slummped over at the facility and unresposive. BG was 44 and she was pale/cool/diaphoretic. When EMS arrived pt was cooperative/resposive. EMS gave 250  mL of D10 and BG was then 34 with slurred speech.

## 2024-03-08 NOTE — H&P (Addendum)
 "     [place]   PATIENT NAME: Joyce Brown    MR#:  [id number]  DATE OF BIRTH:  07-Dec-1950  DATE OF ADMISSION:  03/08/2024  PRIMARY CARE PHYSICIAN: Sharma Coyer, MD   Patient is coming from: [facility]  REQUESTING/REFERRING PHYSICIAN: Waymond Lorelle Cummins, MD   CHIEF COMPLAINT:   Chief Complaint  Patient presents with   Hypoglycemia    HISTORY OF PRESENT ILLNESS:  Lindsea Olivar is a 73 y.o. female with medical history significant for asthma, depression, type 2 diabetes mellitus on OmniPod, GERD, essential hypertension, dyslipidemia and OSA, who presented to the ER with acute onset of hypoglycemia.  She denies any new change in her medications or insulin .  She has been monitoring her insulin  and glucose via her phone.  She believes that her dinner was a little bit late this evening and that is the main reason for her blood glucose dropping.  She has been following it on her phone seeing the dropping.  She was found at her facility slumped over in her wheelchair with a blood glucose of 44.  By arrival EMS she was given a D10 bolus and recheck blood glucose level was still in the 30s.  In the ER it was still in the 30s.  She was lightheaded and diaphoretic at her facility before her staff found her.  She currently feels better.  She did not have her phone with her and was unable to turn off her OmniPod.  She denies any nausea or vomiting or diarrhea or abdominal pain.SABRANo chest pain or palpitations.  No cough or wheezing or dyspnea.  No dysuria, oliguria or hematuria or flank pain.  ED Course: When she came to the ER, BP was 135/57 with otherwise normal vital signs.  Labs revealed potassium 5.4 and blood glucose of 205, AST 45 ALT 48 and total protein of 6.4 better than previous levels.  Otherwise unremarkable CMP.  CBC showed hemoglobin 11.3 and hematocrit 36.2, better than previous levels. EKG as reviewed by me : None Imaging: None  She will be admitted to an  observation telemetry bed for further evaluation and management. PAST MEDICAL HISTORY:   Past Medical History:  Diagnosis Date   Arthritis 1995   Have family history of it.   Asthma 1995   Has resolved   Cataract 2017   Family History of them   Cerebellar ataxia (HCC)    Depression 1996   Due to Fibromyalgia.   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   Glaucoma Various   Family History of it.   History of vaginal hysterectomy 05/24/2014   Hyperlipidemia    Hypertension 1985   Medication used until 1996, sometimes have had high readings since 2021   Neuromuscular disorder 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    PAST SURGICAL HISTORY:   Past Surgical History:  Procedure Laterality Date   ABDOMINAL HYSTERECTOMY     ABDOMINAL HYSTERECTOMY  1997   Partial Hysterectomy   ADENOIDECTOMY  ANKLE SURGERY Left    BILATERAL CARPAL TUNNEL RELEASE     BREAST BIOPSY Left 2019   benign   COLON SURGERY  09/25/2012   Gastric Bypass Surgery   COLONOSCOPY WITH PROPOFOL  N/A 03/24/2023   Procedure: COLONOSCOPY WITH PROPOFOL ;  Surgeon: Unk Corinn Skiff, MD;  Location: [facility] ENDOSCOPY;  Service: Gastroenterology;  Laterality: N/A;   ELBOW SURGERY     EYE SURGERY  03/14/22 & 03/17/22   Cataract Surgery   GASTRIC BYPASS  2014   HAND NEUROPLASTY     NASAL SINUS SURGERY     POLYPECTOMY  03/24/2023   Procedure: POLYPECTOMY;  Surgeon: Unk Corinn Skiff, MD;  Location: [facility] ENDOSCOPY;  Service: Gastroenterology;;   radio denervation  05/21/2020   SMALL  INTESTINE SURGERY  08/18/2013   Laparoscopic Procedure to Relieve Small Bowel Obstruction   TONSILLECTOMY      SOCIAL HISTORY:   Social History   Tobacco Use   Smoking status: Never    Passive exposure: Never   Smokeless tobacco: Never  Substance Use Topics   Alcohol  use: Never    FAMILY HISTORY:   Family History  Problem Relation Age of Onset   Cervical cancer Mother    Ovarian cancer Mother    Arthritis Mother    Cancer Mother    Dementia Father    Retinal detachment Father    Heart attack Father    Diabetes Father    Stroke Father    Hypertension Father    Alcohol  abuse Father    Arthritis Father    Hearing loss Father    Hyperlipidemia Father    Ovarian cancer Sister    Arthritis Sister    Arthritis Sister    Arthritis Sister    Arthritis Sister    Glaucoma Paternal Grandfather    Arthritis Paternal Grandfather    Depression Paternal Grandfather    Hearing loss Paternal Grandfather    Diabetes Paternal Grandmother    Arthritis Sister    Arthritis Sister    Cancer Sister    Arthritis Sister    Diabetes Maternal Uncle    Vision loss Maternal Uncle    Arthritis Maternal Grandmother    Arthritis Sister    Diabetes Maternal Uncle    Vision loss Maternal Uncle     DRUG ALLERGIES:  Allergies[1]  REVIEW OF SYSTEMS:   ROS As per history of present illness. All pertinent systems were reviewed above. Constitutional, HEENT, cardiovascular, respiratory, GI, GU, musculoskeletal, neuro, psychiatric, endocrine, integumentary and hematologic systems were reviewed and are otherwise negative/unremarkable except for positive findings mentioned above in the HPI.   MEDICATIONS AT HOME:   Prior to Admission medications  Medication Sig Start Date End Date Taking? Authorizing Provider  Acetaminophen  500 MG capsule Take 500 mg by mouth every 6 (six) hours as needed for moderate pain.    [provider]  alendronate  (FOSAMAX ) 70 MG tablet Take 1 tablet (70 mg  total) by mouth every 7 (seven) days. Take with a full glass of water on an empty stomach. 11/01/23   Simmons-Robinson, Rockie, MD  antiseptic oral rinse (BIOTENE) LIQD 15 mLs by Mouth Rinse route in the morning, at noon, in the evening, and at bedtime.    [provider]  aspirin  EC 81 MG tablet Take 1 tablet (81 mg total) by mouth daily. Swallow whole. 10/27/23   Wouk, Devaughn Sayres, MD  atorvastatin  (LIPITOR ) 80 MG tablet TAKE 1 TABLET (80 MG TOTAL) BY MOUTH DAILY. 06/06/23   Ostwalt,  Janna, PA-C  BD SAFETYGLIDE INSULIN  SYRINGE 31G X 15/64 0.5 ML MISC  10/29/23   [provider]  carboxymethylcellulose (REFRESH PLUS) 0.5 % SOLN Place 1 drop into both eyes 3 (three) times daily as needed.    [provider]  Cholecalciferol  (VITAMIN D3) 25 MCG (1000 UT) CAPS Take 1 capsule by mouth daily. 10/31/23   [provider]  Clobetasol  Propionate (IMPOYZ ) 0.025 % CREA Patient states applys 1 x per day. 11/01/23   Simmons-Robinson, Makiera, MD  Cyanocobalamin  (VITAMIN B-12 PO) Take by mouth.    [provider]  docusate sodium  (COLACE) 100 MG capsule Take 100 mg by mouth daily as needed for mild constipation.    [provider]  estradiol  (ESTRACE ) 0.1 MG/GM vaginal cream Place 1 Applicatorful vaginally 2 (two) times a week. 11/03/23   Simmons-Robinson, Makiera, MD  Ferrous Sulfate  (IRON ) 325 (65 Fe) MG TABS Take 1 tablet by mouth every morning. 10/31/23   [provider]  fesoterodine  (TOVIAZ ) 8 MG TB24 tablet Take 1 tablet (8 mg total) by mouth daily. 03/05/24   Simmons-Robinson, Rockie, MD  gabapentin  (NEURONTIN ) 100 MG capsule Take 2 capsules (200 mg total) by mouth 3 (three) times daily. 01/11/23   Simmons-Robinson, Makiera, MD  Glucagon  (BAQSIMI  TWO PACK) 3 MG/DOSE POWD Place 3 mg into the nose as needed (For hypoglycemia). 04/11/23   Amin, Sumayya, MD  insulin  aspart (NOVOLOG ) 100 UNIT/ML injection Inject 4 Units into the skin 2 (two) times daily  before lunch and supper. 10/27/23   Ernest Ronal BRAVO, MD  insulin  aspart (NOVOLOG ) 100 UNIT/ML injection Inject 12 Units into the skin daily before breakfast. 11/01/23   Simmons-Robinson, Rockie, MD  Insulin  Disposable Pump (OMNIPOD 5 DEXG7G6 PODS GEN 5) MISC SMARTSIG:SUB-Q Every 3 Days 10/28/23   [provider]  insulin  glargine-yfgn (SEMGLEE ) 100 UNIT/ML injection Inject 0.18 mLs (18 Units total) into the skin in the morning. 11/01/23   Simmons-Robinson, Rockie, MD  Insulin  Pen Needle (DROPLET PEN NEEDLES) 31G X 5 MM MISC USE 4 TIMES DAILY AS DIRECTED BY YOUR DOCTOR 01/26/23   Shamleffer, Donell Cardinal, MD  LANTUS  SOLOSTAR 100 UNIT/ML Solostar Pen Inject into the skin. 11/12/23   [provider]  losartan  (COZAAR ) 25 MG tablet Take 1 tablet (25 mg total) by mouth daily. 10/27/23   Wouk, Devaughn Sayres, MD  Microlet Lancets MISC 3 (three) times daily. 08/26/17   [provider]  Multiple Vitamins-Minerals (CENTRUM SILVER ADULT 50+) TABS Take 1 tablet by mouth daily.    [provider]  Omega 3-6-9 CAPS Take 1 capsule by mouth daily. 10/31/23   [provider]  omega-3 acid ethyl esters (LOVAZA ) 1 g capsule Take by mouth. 11/08/23   [provider]  sertraline  (ZOLOFT ) 100 MG tablet TAKE 1 TABLET EVERY DAY 02/24/23   Simmons-Robinson, Rockie, MD  SODIUM FLUORIDE  5000 PPM 1.1 % GEL dental gel SMARTSIG:1 sparingly By Mouth Every Night 10/31/23   [provider]  solifenacin  (VESICARE ) 10 MG tablet Take 10 mg by mouth daily. 11/08/23   [provider]  Tavaborole 5 % SOLN Apply topically.    [provider]  tiZANidine  (ZANAFLEX ) 2 MG tablet TAKE 1 TO 2 TABLETS BY MOUTH AT BEDTIME AS NEEDED FOR MUSCLE SPASMS 05/12/23   Simmons-Robinson, Makiera, MD      VITAL SIGNS:  Blood pressure 118/60, pulse 66, temperature 98.1 F (36.7 C), temperature source Oral, resp. rate 16, height 5' 10 (1.778 m), weight 84.5 kg, SpO2  100%.  PHYSICAL EXAMINATION:  Physical Exam  GENERAL:  73 y.o.-year-old patient lying in the bed with no acute distress.  EYES: Pupils equal, round, reactive to light and accommodation. No scleral icterus. Extraocular muscles intact.  HEENT: Head atraumatic, normocephalic. Oropharynx and nasopharynx clear.  NECK:  Supple, no jugular venous distention. No thyroid  enlargement, no tenderness.  LUNGS: Normal breath sounds bilaterally, no wheezing, rales,rhonchi or crepitation. No use of accessory muscles of respiration.  CARDIOVASCULAR: Regular rate and rhythm, S1, S2 normal. No murmurs, rubs, or gallops.  ABDOMEN: Soft, nondistended, nontender. Bowel sounds present. No organomegaly or mass.  EXTREMITIES: No pedal edema, cyanosis, or clubbing.  NEUROLOGIC: Cranial nerves II through XII are intact. Muscle strength 5/5 in all extremities. Sensation intact. Gait not checked.  PSYCHIATRIC: The patient is alert and oriented x 3.  Normal affect and good eye contact. SKIN: No obvious rash, lesion, or ulcer.   LABORATORY PANEL:   CBC Recent Labs  Lab 03/08/24 1938  WBC 9.1  HGB 11.3*  HCT 36.2  PLT 322   ------------------------------------------------------------------------------------------------------------------  Chemistries  Recent Labs  Lab 03/08/24 1938  NA 142  K 5.4*  CL 105  CO2 27  GLUCOSE 205*  BUN 19  CREATININE 0.95  CALCIUM  9.2  AST 45*  ALT 48*  ALKPHOS 102  BILITOT <0.2   ------------------------------------------------------------------------------------------------------------------  Cardiac Enzymes No results for input(s): TROPONINI in the last 168 hours. ------------------------------------------------------------------------------------------------------------------  RADIOLOGY:  No results found.    IMPRESSION AND PLAN:  Assessment and Plan: * Hypoglycemia - This could be related to malfunction of her OmniPod though the patient believes that  it was related to delayed dinner in her ALF. - She will be admitted to a telemetry observation bed. - Will continue on D5 half normal saline at 75 mL/h. - Hypoglycemia protocol will be followed. - She will be placed on supplemental coverage with sensitive level NovoLog .  Hyperkalemia - Will place her on a small dose of p.o. Lokelma .  Type 1 diabetes mellitus with diabetic polyneuropathy (HCC) - The patient will be placed on supplemental coverage with NovoLog  sensitive protocol as mentioned above. - Long-acting insulin  will certainly be held off given her hypoglycemia. - Will continue Neurontin .  Dyslipidemia - Will continue statin therapy and Lovaza .  Essential hypertension - Will continue anti-hypertensive therapy.  Depression - Will continue Zoloft .   DVT prophylaxis: Lovenox .  Advanced Care Planning:  Code Status: She is DNR and DNI.  This was discussed with her..  Family Communication:  The plan of care was discussed in details with the patient (and family). I answered all questions. The patient agreed to proceed with the above mentioned plan. Further management will depend upon hospital course. Disposition Plan: Back to previous home environment Consults called: none.  All the records are reviewed and case discussed with ED provider.  Status is: Observation  I certify that at the time of admission, it is my clinical judgment that the patient will require hospital care extending less than 2 midnights.                            Dispo: The patient is from: [facility].              Anticipated d/c is to:  house ALF.              Patient currently is not medically stable to d/c.  Difficult to place patient: No  Madison DELENA Peaches M.D on 03/08/2024 at 10:42 PM  [facility]   From 7 PM-7 AM, contact night-coverage [email or web address]  CC: Primary care physician; Sharma Coyer, MD     [1] No Known Allergies  "

## 2024-03-08 NOTE — ED Notes (Signed)
 Pts wearable insulin  pump was removed by Fredderick, RN due to hypoglycemia.  Device placed in a plastic bag and tagged with one of pts ER labels.  Pt's pump was left on computer cart in pts room.

## 2024-03-08 NOTE — Assessment & Plan Note (Deleted)
-   The patient will be placed on supplemental coverage with NovoLog  sensitive protocol as mentioned above. - Long-acting insulin  will certainly be held off given her hypoglycemia. - Will continue Neurontin .

## 2024-03-08 NOTE — Assessment & Plan Note (Addendum)
Will continue statin therapy and Lovaza

## 2024-03-09 DIAGNOSIS — E11649 Type 2 diabetes mellitus with hypoglycemia without coma: Secondary | ICD-10-CM | POA: Diagnosis not present

## 2024-03-09 DIAGNOSIS — E162 Hypoglycemia, unspecified: Secondary | ICD-10-CM | POA: Diagnosis not present

## 2024-03-09 LAB — CBC
HCT: 36.9 % (ref 36.0–46.0)
Hemoglobin: 12.2 g/dL (ref 12.0–15.0)
MCH: 30.4 pg (ref 26.0–34.0)
MCHC: 33.1 g/dL (ref 30.0–36.0)
MCV: 92 fL (ref 80.0–100.0)
Platelets: 258 K/uL (ref 150–400)
RBC: 4.01 MIL/uL (ref 3.87–5.11)
RDW: 14.3 % (ref 11.5–15.5)
WBC: 7.6 K/uL (ref 4.0–10.5)
nRBC: 0 % (ref 0.0–0.2)

## 2024-03-09 LAB — BASIC METABOLIC PANEL WITH GFR
Anion gap: 13 (ref 5–15)
BUN: 22 mg/dL (ref 8–23)
CO2: 21 mmol/L — ABNORMAL LOW (ref 22–32)
Calcium: 8.7 mg/dL — ABNORMAL LOW (ref 8.9–10.3)
Chloride: 104 mmol/L (ref 98–111)
Creatinine, Ser: 0.95 mg/dL (ref 0.44–1.00)
GFR, Estimated: >60 mL/min
Glucose, Bld: 289 mg/dL — ABNORMAL HIGH (ref 70–99)
Potassium: 5.8 mmol/L — ABNORMAL HIGH (ref 3.5–5.1)
Sodium: 138 mmol/L (ref 135–145)

## 2024-03-09 LAB — GLUCOSE, CAPILLARY
Glucose-Capillary: 223 mg/dL — ABNORMAL HIGH (ref 70–99)
Glucose-Capillary: 225 mg/dL — ABNORMAL HIGH (ref 70–99)
Glucose-Capillary: 318 mg/dL — ABNORMAL HIGH (ref 70–99)
Glucose-Capillary: 365 mg/dL — ABNORMAL HIGH (ref 70–99)
Glucose-Capillary: 447 mg/dL — ABNORMAL HIGH (ref 70–99)
Glucose-Capillary: 92 mg/dL (ref 70–99)

## 2024-03-09 LAB — HEMOGLOBIN A1C
Hgb A1c MFr Bld: 7.4 % — ABNORMAL HIGH (ref 4.8–5.6)
Mean Plasma Glucose: 165.68 mg/dL

## 2024-03-09 MED ORDER — INSULIN GLARGINE 100 UNIT/ML ~~LOC~~ SOLN
15.0000 [IU] | Freq: Every day | SUBCUTANEOUS | Status: DC
  Administered 2024-03-09 – 2024-03-10 (×2): 15 [IU] via SUBCUTANEOUS
  Filled 2024-03-09 (×2): qty 0.15

## 2024-03-09 MED ORDER — INSULIN ASPART 100 UNIT/ML IJ SOLN
20.0000 [IU] | Freq: Once | INTRAMUSCULAR | Status: AC
  Administered 2024-03-09: 20 [IU] via SUBCUTANEOUS
  Filled 2024-03-09: qty 20

## 2024-03-09 MED ORDER — INSULIN ASPART 100 UNIT/ML IJ SOLN
5.0000 [IU] | Freq: Three times a day (TID) | INTRAMUSCULAR | Status: DC
  Administered 2024-03-09 – 2024-03-10 (×2): 5 [IU] via SUBCUTANEOUS
  Filled 2024-03-09 (×2): qty 5

## 2024-03-09 MED ORDER — SODIUM ZIRCONIUM CYCLOSILICATE 10 G PO PACK
10.0000 g | PACK | Freq: Once | ORAL | Status: AC
  Administered 2024-03-09: 10 g via ORAL
  Filled 2024-03-09: qty 1

## 2024-03-09 NOTE — Progress Notes (Signed)
 Made Dr. Lawence aware of blood sugar 447. New orders to hold D5 1/2NS and give 20 units of Novolog  subcutaneous.

## 2024-03-09 NOTE — Care Management Obs Status (Signed)
 MEDICARE OBSERVATION STATUS NOTIFICATION   Patient Details  Name: Joyce Brown MRN: [id number] Date of Birth: 05-Jul-1950   Medicare Observation Status Notification Given:  Chaney BRANDY CHRISTIANE LELON, CMA 03/09/2024, 3:09 PM

## 2024-03-09 NOTE — Plan of Care (Signed)

## 2024-03-09 NOTE — Plan of Care (Signed)
  Problem: Metabolic: Goal: Ability to maintain appropriate glucose levels will improve Outcome: Progressing   Problem: Nutrition: Goal: Adequate nutrition will be maintained Outcome: Progressing   Problem: Safety: Goal: Ability to remain free from injury will improve Outcome: Progressing

## 2024-03-09 NOTE — Inpatient Diabetes Management (Addendum)
 Inpatient Diabetes Program Recommendations  AACE/ADA: New Consensus Statement on Inpatient Glycemic Control (2015)  Target Ranges:  Prepandial:   less than 140 mg/dL      Peak postprandial:   less than 180 mg/dL (1-2 hours)      Critically ill patients:  140 - 180 mg/dL    Latest Reference Range & Units 03/08/24 19:38  Hemoglobin A1C 4.8 - 5.6 % 7.4 (H)  (H): Data is abnormally high  Latest Reference Range & Units 03/08/24 18:50 03/08/24 19:23 03/08/24 20:08 03/08/24 21:30 03/09/24 00:12 03/09/24 01:58  Glucose-Capillary 70 - 99 mg/dL 30 (LL) 62 (L) 748 (H) 270 (H) 447 (H)  20 units Novolog   223 (H)  2 units Novolog      Latest Reference Range & Units 03/09/24 08:04  Glucose-Capillary 70 - 99 mg/dL 681 (H)  7 units Novolog   15 units Lantus  @0951    (H): Data is abnormally high   Admit from [facility] with Hypoglycemia   History: Type 1 diabetes  Home DM Meds: Semglee  18 units Daily (off pump)       Novolog  12 units with Breakfast/ 4 units with Lunch/ 4 units with Dinner (off pump)       OmniPod 5 Insulin  Pump       Dexcom G7 CGM  Current Orders: Novolog  Sensitive Correction Scale/ SSI (0-9 units) TID AC + HS    [place] to MD this AM requesting Lantus  15 units daily (80% home dose basal insulin ) while pt off her insulin  pump Orders added for Lantus  and Lantus  given this AM   Addendum 12:30pm--Met w/ pt at bedside.  Confirmed she has Type 1 diabetes and sees Dr. Carlin at [facility] for ENDO.  Took insulin  pump pod off last night in the ED.  Does not have pump supplies with her but does have more supplies at her ALF and will be able to restart her pump when she goes home.  I discussed with pt that we have given her Lantus  insulin  15 units this AM and have her on a SSI with Novolog .  Pt requesting Meal Coverage as well.  Will reach out to MD to request Novolog  MC.  Discussed with pt that she needs to ask the RN on day of d/c when her last dose of basal insulin  was so she can  properly time restarting her insulin  pump 24H after last dose Lantus  given so as not to overlap the basal rate on her pump and the basal insulin  given.  Pt stated understanding and very appreciative of visit.    ENDO: Dr. Carlin with [facility] Last Seen 03/05/2024 No Changes made to Insulin  Pump at this visit Pump settings: Omnipod 5 in auto mode Basal 0.75 ICR 10 ISF 55 Target 120  Correct above 130 AIT 4 h  Pump backup plan: Lantus  18 units daily NovoLog  ICR 10 + 1:50>150  Hx Type 1 diabetes    --Will follow patient during hospitalization--  Adina Rudolpho Arrow RN, MSN, CDCES Diabetes Coordinator Inpatient Glycemic Control Team Team Pager: [phone] (8a-5p)

## 2024-03-09 NOTE — Progress Notes (Signed)
 " Progress Note   Patient: Joyce Brown FMW:[id number] DOB: 06/02/1950 DOA: 03/08/2024     0 DOS: the patient was seen and examined on 03/09/2024   Brief hospital course: Joyce Brown is a 73 y.o. female with medical history significant for asthma, depression, type 2 diabetes mellitus on OmniPod, GERD, essential hypertension, dyslipidemia and OSA, who presented to the ER with acute onset of hypoglycemia.  She denies any new change in her medications or insulin .  She has been monitoring her insulin  and glucose via her phone.  She believes that her dinner was a little bit late and that is the main reason for her blood glucose dropping.  The emergency room patient received some glucose infusion.  Currently blood glucose running high.  Rest of management as noted below  Assessment and Plan:  Hypoglycemia now hyperglycemic Continue carb controlled diet Dextrose  infusion discontinued as hypoglycemia is resolved   Hyperkalemia Patient received another dose of Lokelma    Type 1 diabetes mellitus with diabetic polyneuropathy (HCC) and hyperglycemia Continue Neurontin . Hypoglycemia resolved Currently on insulin , will continue monitoring glucose level closely Since patient requires closer monitoring of her glucose with adjustment of insulin  we will continue to monitor her overnight until glucose levels are better controlled We appreciate the input of diabetic coordinator   Dyslipidemia - Will continue statin therapy and Lovaza .   Essential hypertension - Will continue anti-hypertensive therapy.   Depression - Will continue Zoloft .     DVT prophylaxis: Lovenox .   Advanced Care Planning:   Code Status: She is DNR and DNI.    Family Communication: None present at bedside   Subjective:  Patient presented with hypoglycemia She is currently having hypoglycemia requiring several insulin  dosing Denies chest pain nausea vomiting abdominal pain   Physical Exam:  GENERAL:  Elderly female laying in bed in no acute distress.  LUNGS: Clear to auscultation bilaterally CARDIOVASCULAR: Regular rate and rhythm, S1, S2 normal. ABDOMEN: Soft, nondistended, nontender. EXTREMITIES: No pedal edema, cyanosis, or clubbing.  NEUROLOGIC: Cranial nerves II through XII are intact.  PSYCHIATRIC: The patient is alert and oriented x 3.  SKIN: No obvious rash, lesion, or ulcer.    Vitals:   03/09/24 0004 03/09/24 0414 03/09/24 0725 03/09/24 1212  BP: (!) 132/58 (!) 116/54 (!) 114/51 121/62  Pulse: 67 67 77 73  Resp: 13 12  18   Temp: 98.2 F (36.8 C) 98.3 F (36.8 C) (!) 97.4 F (36.3 C) 97.8 F (36.6 C)  TempSrc:   Oral   SpO2: 100% 100% 97% 100%  Weight:      Height:        Data Reviewed:     Latest Ref Rng & Units 03/09/2024    7:49 AM 03/08/2024    7:38 PM 10/27/2023   11:52 AM  CBC  WBC 4.0 - 10.5 K/uL 7.6  9.1  6.9   Hemoglobin 12.0 - 15.0 g/dL 87.7  88.6  89.2   Hematocrit 36.0 - 46.0 % 36.9  36.2  33.6   Platelets 150 - 400 K/uL 258  322  269        Latest Ref Rng & Units 03/09/2024    7:49 AM 03/08/2024    7:38 PM 10/27/2023   11:26 AM  BMP  Glucose 70 - 99 mg/dL 710  794  539   BUN 8 - 23 mg/dL 22  19  21    Creatinine 0.44 - 1.00 mg/dL 9.04  9.04  9.13   Sodium 135 - 145  mmol/L 138  142  136   Potassium 3.5 - 5.1 mmol/L 5.8  5.4  4.9   Chloride 98 - 111 mmol/L 104  105  101   CO2 22 - 32 mmol/L 21  27  24    Calcium  8.9 - 10.3 mg/dL 8.7  9.2  8.8       Author: Drue ONEIDA Potter, MD 03/09/2024 2:58 PM  For on call review [email or web address].  "

## 2024-03-10 DIAGNOSIS — E162 Hypoglycemia, unspecified: Secondary | ICD-10-CM | POA: Diagnosis not present

## 2024-03-10 DIAGNOSIS — E11649 Type 2 diabetes mellitus with hypoglycemia without coma: Secondary | ICD-10-CM | POA: Diagnosis not present

## 2024-03-10 LAB — CBC WITH DIFFERENTIAL/PLATELET
Abs Immature Granulocytes: 0.02 K/uL (ref 0.00–0.07)
Basophils Absolute: 0.1 K/uL (ref 0.0–0.1)
Basophils Relative: 1 %
Eosinophils Absolute: 0.3 K/uL (ref 0.0–0.5)
Eosinophils Relative: 6 %
HCT: 34.8 % — ABNORMAL LOW (ref 36.0–46.0)
Hemoglobin: 11 g/dL — ABNORMAL LOW (ref 12.0–15.0)
Immature Granulocytes: 0 %
Lymphocytes Relative: 26 %
Lymphs Abs: 1.4 K/uL (ref 0.7–4.0)
MCH: 29.8 pg (ref 26.0–34.0)
MCHC: 31.6 g/dL (ref 30.0–36.0)
MCV: 94.3 fL (ref 80.0–100.0)
Monocytes Absolute: 0.5 K/uL (ref 0.1–1.0)
Monocytes Relative: 9 %
Neutro Abs: 3.2 K/uL (ref 1.7–7.7)
Neutrophils Relative %: 58 %
Platelets: 290 K/uL (ref 150–400)
RBC: 3.69 MIL/uL — ABNORMAL LOW (ref 3.87–5.11)
RDW: 14.4 % (ref 11.5–15.5)
WBC: 5.5 K/uL (ref 4.0–10.5)
nRBC: 0 % (ref 0.0–0.2)

## 2024-03-10 LAB — GLUCOSE, CAPILLARY
Glucose-Capillary: 109 mg/dL — ABNORMAL HIGH (ref 70–99)
Glucose-Capillary: 154 mg/dL — ABNORMAL HIGH (ref 70–99)
Glucose-Capillary: 228 mg/dL — ABNORMAL HIGH (ref 70–99)

## 2024-03-10 LAB — BASIC METABOLIC PANEL WITH GFR
Anion gap: 8 (ref 5–15)
BUN: 20 mg/dL (ref 8–23)
CO2: 26 mmol/L (ref 22–32)
Calcium: 8.7 mg/dL — ABNORMAL LOW (ref 8.9–10.3)
Chloride: 105 mmol/L (ref 98–111)
Creatinine, Ser: 0.87 mg/dL (ref 0.44–1.00)
GFR, Estimated: >60 mL/min
Glucose, Bld: 166 mg/dL — ABNORMAL HIGH (ref 70–99)
Potassium: 5 mmol/L (ref 3.5–5.1)
Sodium: 139 mmol/L (ref 135–145)

## 2024-03-10 NOTE — Discharge Summary (Signed)
 " Physician Discharge Summary   Patient: Joyce Brown MRN: [id number] DOB: 1950/08/30  Admit date:     03/08/2024  Discharge date: 03/10/2024  Discharge Physician: Drue ONEIDA Potter   PCP: Sharma Coyer, MD   Recommendations at discharge:  Follow-up with PCP  Discharge Diagnoses: Principal Problem:   Hypoglycemia Active Problems:   Hyperkalemia   Type 1 diabetes mellitus with diabetic polyneuropathy (HCC)   Dyslipidemia   Depression   Essential hypertension  Resolved Problems:   * No resolved hospital problems. *  Hospital Course: Joyce Brown is a 73 y.o. female with medical history significant for asthma, depression, type 2 diabetes mellitus on OmniPod, GERD, essential hypertension, dyslipidemia and OSA, who presented to the ER with acute onset of hypoglycemia.  She denies any new change in her medications or insulin .  She has been monitoring her insulin  and glucose via her phone.  She believes that her dinner was a little bit late and that is the main reason for her blood glucose dropping.  The emergency room patient received some glucose infusion.  Currently blood glucose running high.   Rest of management as noted below   Assessment and Plan:   Hypoglycemia now hyperglycemic Continue carb controlled diet Hypoglycemia resolved   Hyperkalemia Patient received another dose of Lokelma    Type 1 diabetes mellitus with diabetic polyneuropathy (HCC) and hyperglycemia Continue Neurontin . Hypoglycemia resolved Currently on insulin , will continue monitoring glucose level closely Since patient requires closer monitoring of her glucose with adjustment of insulin  we will continue to monitor her overnight until glucose levels are better controlled We appreciate the input of diabetic coordinator   Dyslipidemia - Will continue statin therapy and Lovaza .   Essential hypertension - Will continue anti-hypertensive therapy.   Depression - Will continue  Zoloft    Consultants: None Procedures performed: None Disposition: Long term care facility Diet recommendation:  Cardiac diet DISCHARGE MEDICATION: Allergies as of 03/10/2024   No Known Allergies      Medication List     STOP taking these medications    losartan  25 MG tablet Commonly known as: COZAAR        TAKE these medications    Acetaminophen  500 MG capsule Take 500 mg by mouth every 6 (six) hours as needed for moderate pain.   alendronate  70 MG tablet Commonly known as: FOSAMAX  Take 1 tablet (70 mg total) by mouth every 7 (seven) days. Take with a full glass of water on an empty stomach.   antiseptic oral rinse Liqd 15 mLs by Mouth Rinse route in the morning, at noon, in the evening, and at bedtime.   aspirin  EC 81 MG tablet Take 1 tablet (81 mg total) by mouth daily. Swallow whole.   atorvastatin  80 MG tablet Commonly known as: LIPITOR  TAKE 1 TABLET (80 MG TOTAL) BY MOUTH DAILY.   Baqsimi  Two Pack 3 MG/DOSE Powd Generic drug: Glucagon  Place 3 mg into the nose as needed (For hypoglycemia).   BD SafetyGlide Insulin  Syringe 31G X 15/64 0.5 ML Misc Generic drug: Insulin  Syringe-Needle U-100   carboxymethylcellulose 0.5 % Soln Commonly known as: REFRESH PLUS Place 1 drop into both eyes 3 (three) times daily as needed.   Centrum Silver Adult 50+ Tabs Take 1 tablet by mouth daily.   Clobetasol  Propionate 0.025 % Crea Commonly known as: Impoyz  Patient states applys 1 x per day.   docusate sodium  100 MG capsule Commonly known as: COLACE Take 100 mg by mouth daily as needed for mild constipation.  Droplet Pen Needles 31G X 5 MM Misc Generic drug: Insulin  Pen Needle USE 4 TIMES DAILY AS DIRECTED BY YOUR DOCTOR   estradiol  0.1 MG/GM vaginal cream Commonly known as: ESTRACE  Place 1 Applicatorful vaginally 2 (two) times a week.   fesoterodine  8 MG Tb24 tablet Commonly known as: TOVIAZ  Take 1 tablet (8 mg total) by mouth daily.   gabapentin  100 MG  capsule Commonly known as: NEURONTIN  Take 2 capsules (200 mg total) by mouth 3 (three) times daily.   gabapentin  300 MG capsule Commonly known as: NEURONTIN  Take 300 mg by mouth 3 (three) times daily.   insulin  aspart 100 UNIT/ML injection Commonly known as: novoLOG  Inject 4 Units into the skin 2 (two) times daily before lunch and supper.   insulin  aspart 100 UNIT/ML injection Commonly known as: novoLOG  Inject 12 Units into the skin daily before breakfast.   insulin  glargine-yfgn 100 UNIT/ML injection Commonly known as: SEMGLEE  Inject 0.18 mLs (18 Units total) into the skin in the morning.   Iron  325 (65 Fe) MG Tabs Take 1 tablet by mouth every morning.   Lantus  SoloStar 100 UNIT/ML Solostar Pen Generic drug: insulin  glargine Inject into the skin.   Microlet Lancets Misc 3 (three) times daily.   Omega 3-6-9 Caps Take 1 capsule by mouth daily.   omega-3 acid ethyl esters 1 g capsule Commonly known as: LOVAZA  Take by mouth.   Omnipod 5 DexG7G6 Pods Gen 5 Misc SMARTSIG:SUB-Q Every 3 Days   sertraline  100 MG tablet Commonly known as: ZOLOFT  TAKE 1 TABLET EVERY DAY   Sodium Fluoride  5000 PPM 1.1 % Gel dental gel Generic drug: sodium fluoride  SMARTSIG:1 sparingly By Mouth Every Night   solifenacin  10 MG tablet Commonly known as: VESICARE  Take 10 mg by mouth daily.   Tavaborole 5 % Soln Apply topically.   tiZANidine  2 MG tablet Commonly known as: ZANAFLEX  TAKE 1 TO 2 TABLETS BY MOUTH AT BEDTIME AS NEEDED FOR MUSCLE SPASMS   VITAMIN B-12 PO Take by mouth.   Vitamin D3 25 MCG (1000 UT) Caps Take 1 capsule by mouth daily.        Follow-up Information     Simmons-Robinson, Rockie, MD Follow up.   Specialty: Family Medicine Why: Hospital follow up Contact information: [address] [place] [address] [phone]                Discharge Exam: Joyce Brown   03/08/24 1911  Weight: 84.5 kg    GENERAL: Elderly female  laying in bed in no acute distress.  LUNGS: Clear to auscultation bilaterally CARDIOVASCULAR: Regular rate and rhythm, S1, S2 normal. ABDOMEN: Soft, nondistended, nontender. EXTREMITIES: No pedal edema, cyanosis, or clubbing.  NEUROLOGIC: Cranial nerves II through XII are intact.  PSYCHIATRIC: The patient is alert and oriented x 3.  SKIN: No obvious rash, lesion, or ulcer.   Condition at discharge: good  The results of significant diagnostics from this hospitalization (including imaging, microbiology, ancillary and laboratory) are listed below for reference.   Imaging Studies: No results found.  Microbiology: Results for orders placed or performed during the hospital encounter of 10/16/23  Blood culture (routine single)     Status: None   Collection Time: 10/16/23 12:47 PM   Specimen: BLOOD  Result Value Ref Range Status   Specimen Description BLOOD LEFT ANTECUBITAL  Final   Special Requests   Final    BOTTLES DRAWN AEROBIC AND ANAEROBIC Blood Culture results may not be optimal due to an inadequate  volume of blood received in culture bottles   Culture   Final    NO GROWTH 5 DAYS Performed at [facility], [address] [place], [place] [address]    Report Status 10/21/2023 FINAL  Final  Culture, blood (single)     Status: None   Collection Time: 10/16/23  1:06 PM   Specimen: BLOOD  Result Value Ref Range Status   Specimen Description BLOOD BLOOD RIGHT HAND  Final   Special Requests   Final    BOTTLES DRAWN AEROBIC AND ANAEROBIC Blood Culture adequate volume   Culture   Final    NO GROWTH 5 DAYS Performed at [facility], [address] [place], [place] [address]    Report Status 10/21/2023 FINAL  Final  MRSA Next Gen by PCR, Nasal     Status: None   Collection Time: 10/16/23  5:13 PM   Specimen: Urine, Clean Catch; Nasal Swab  Result Value Ref Range Status   MRSA by PCR Next Gen NOT DETECTED NOT DETECTED Final    Comment: (NOTE) The GeneXpert  MRSA Assay (FDA approved for NASAL specimens only), is one component of a comprehensive MRSA colonization surveillance program. It is not intended to diagnose MRSA infection nor to guide or monitor treatment for MRSA infections. Test performance is not FDA approved in patients less than 69 years old. Performed at [facility], [address] [place], [place] [address]   Respiratory (~20 pathogens) panel by PCR     Status: None   Collection Time: 10/16/23  9:05 PM   Specimen: Nasopharyngeal Swab; Respiratory  Result Value Ref Range Status   Adenovirus NOT DETECTED NOT DETECTED Final   Coronavirus 229E NOT DETECTED NOT DETECTED Final    Comment: (NOTE) The Coronavirus on the Respiratory Panel, DOES NOT test for the novel  Coronavirus (2019 nCoV)    Coronavirus HKU1 NOT DETECTED NOT DETECTED Final   Coronavirus NL63 NOT DETECTED NOT DETECTED Final   Coronavirus OC43 NOT DETECTED NOT DETECTED Final   Metapneumovirus NOT DETECTED NOT DETECTED Final   Rhinovirus / Enterovirus NOT DETECTED NOT DETECTED Final   Influenza A NOT DETECTED NOT DETECTED Final   Influenza B NOT DETECTED NOT DETECTED Final   Parainfluenza Virus 1 NOT DETECTED NOT DETECTED Final   Parainfluenza Virus 2 NOT DETECTED NOT DETECTED Final   Parainfluenza Virus 3 NOT DETECTED NOT DETECTED Final   Parainfluenza Virus 4 NOT DETECTED NOT DETECTED Final   Respiratory Syncytial Virus NOT DETECTED NOT DETECTED Final   Bordetella pertussis NOT DETECTED NOT DETECTED Final   Bordetella Parapertussis NOT DETECTED NOT DETECTED Final   Chlamydophila pneumoniae NOT DETECTED NOT DETECTED Final   Mycoplasma pneumoniae NOT DETECTED NOT DETECTED Final    Comment: Performed at [facility], [address], [place], [place] [address]  Culture, blood (Routine X 2) w Reflex to ID Panel     Status: None   Collection Time: 10/17/23  1:32 AM   Specimen: BLOOD LEFT HAND  Result Value Ref Range Status   Specimen Description  BLOOD LEFT HAND  Final   Special Requests   Final    BOTTLES DRAWN AEROBIC ONLY Blood Culture results may not be optimal due to an inadequate volume of blood received in culture bottles   Culture   Final    NO GROWTH 5 DAYS Performed at [facility], [address], [place], [place] [address]    Report Status 10/22/2023 FINAL  Final  Culture, blood (Routine X 2) w Reflex to ID Panel     Status: None   Collection Time: 10/17/23  1:32 AM   Specimen: BLOOD LEFT HAND  Result Value Ref Range Status   Specimen Description BLOOD LEFT HAND  Final   Special Requests   Final    BOTTLES DRAWN AEROBIC ONLY Blood Culture results may not be optimal due to an inadequate volume of blood received in culture bottles   Culture   Final    NO GROWTH 5 DAYS Performed at [facility], [address] [place], [place] [address]    Report Status 10/22/2023 FINAL  Final    Labs: CBC: Recent Labs  Lab 03/08/24 1938 03/09/24 0749 03/10/24 0735  WBC 9.1 7.6 5.5  NEUTROABS 6.1  --  3.2  HGB 11.3* 12.2 11.0*  HCT 36.2 36.9 34.8*  MCV 98.4 92.0 94.3  PLT 322 258 290   Basic Metabolic Panel: Recent Labs  Lab 03/08/24 1938 03/09/24 0749 03/10/24 0735  NA 142 138 139  K 5.4* 5.8* 5.0  CL 105 104 105  CO2 27 21* 26  GLUCOSE 205* 289* 166*  BUN 19 22 20   CREATININE 0.95 0.95 0.87  CALCIUM  9.2 8.7* 8.7*   Liver Function Tests: Recent Labs  Lab 03/08/24 1938  AST 45*  ALT 48*  ALKPHOS 102  BILITOT <0.2  PROT 6.4*  ALBUMIN 3.7   CBG: Recent Labs  Lab 03/09/24 1544 03/09/24 2130 03/10/24 0304 03/10/24 0730 03/10/24 1201  GLUCAP 365* 92 109* 154* 228*    Discharge time spent:  37 minutes.  Signed: Drue ONEIDA Potter, MD [facility] 03/10/2024 "

## 2024-03-10 NOTE — TOC Transition Note (Signed)
 Transition of Care [facility]) - Discharge Note   Patient Details  Name: Joyce Brown MRN: [id number] Date of Birth: April 19, 1950  Transition of [facility] CM/SW Contact:  Victory Jackquline RAMAN, RN Phone Number: 03/10/2024, 10:45 AM   Clinical Narrative: Patient discharging back to [facility] today. RNCM spoke to Christian at [facility] and he said patient can return today but they don't have transportation for her today. Nurse to call report to [phone]. RNCM called patients sister Judi to notify her of the patient's discharging and she said that she will come pick her up in 1 hour. MD and bedside nurse made aware. No further concerns. RNCM signing off.     Final next level of care: Assisted Living Barriers to Discharge: No Barriers Identified   Patient Goals and CMS Choice            Discharge Placement                Patient to be transferred to facility by: Sister Name of family member notified: Judi Patient and family notified of of transfer: 03/10/24  Discharge Plan and Services Additional resources added to the After Visit Summary for                                       Social Drivers of Health (SDOH) Interventions SDOH Screenings   Food Insecurity: No Food Insecurity (03/09/2024)  Housing: Low Risk (03/09/2024)  Transportation Needs: No Transportation Needs (03/09/2024)  Utilities: Not At Risk (03/09/2024)  Alcohol  Screen: Low Risk (01/11/2023)  Depression (PHQ2-9): Low Risk (12/14/2023)  Financial Resource Strain: Low Risk (09/14/2023)  Physical Activity: Insufficiently Active (09/14/2023)  Social Connections: Unknown (03/09/2024)  Stress: No Stress Concern Present (09/14/2023)  Tobacco Use: Low Risk (03/08/2024)  Health Literacy: Adequate Health Literacy (05/12/2023)     Readmission Risk Interventions     No data to display

## 2024-03-12 ENCOUNTER — Telehealth: Payer: Self-pay

## 2024-03-12 NOTE — Transitions of Care (Post Inpatient/ED Visit) (Signed)
 "  03/12/2024  Name: Joyce Brown MRN: [id number] DOB: [birth year]  Today's TOC FU Call Status: Today's TOC FU Call Status:: Successful TOC FU Call Completed TOC FU Call Complete Date: 03/12/24  Patient's Name and Date of Birth confirmed. Name, DOB  Transition Care Management Follow-up Telephone Call Date of Discharge: 03/10/24 Discharge Facility: [facility]) Type of Discharge: Inpatient Admission Primary Inpatient Discharge Diagnosis:: hypoglycemia How have you been since you were released from the hospital?: Better Any questions or concerns?: No  Items Reviewed: Did you receive and understand the discharge instructions provided?: Yes Medications obtained,verified, and reconciled?: Yes (Medications Reviewed) Any new allergies since your discharge?: No Dietary orders reviewed?: Yes Do you have support at home?: Yes People in Home [RPT]: facility resident  Medications Reviewed Today: Medications Reviewed Today     Reviewed by Emmitt Pan, LPN (Licensed Practical Nurse) on 03/12/24 at 1449  Med List Status: <None>   Medication Order Taking? Sig Documenting Provider Last Dose Status Informant  Acetaminophen  500 MG capsule [id number] Yes Take 500 mg by mouth every 6 (six) hours as needed for moderate pain. [provider]  Active Self, Pharmacy Records           Med Note GAYLENE DARVIN JINNY [place] Mar 08, 2024 11:50 PM)    alendronate  (FOSAMAX ) 70 MG tablet [id number] Yes Take 1 tablet (70 mg total) by mouth every 7 (seven) days. Take with a full glass of water on an empty stomach. Simmons-Robinson, Rockie, MD  Active   antiseptic oral rinse KEENAN) LIQD [id number] Yes 15 mLs by Mouth Rinse route in the morning, at noon, in the evening, and at bedtime. [provider]  Active Self, Pharmacy Records  aspirin  EC 81 MG tablet [id number] Yes Take 1 tablet (81 mg total) by mouth daily. Swallow whole. Wouk, Devaughn Sayres, MD  Active    atorvastatin  (LIPITOR ) 80 MG tablet [id number] Yes TAKE 1 TABLET (80 MG TOTAL) BY MOUTH DAILY. Ostwalt, Janna, PA-C  Active Self, Pharmacy Records  BD SAFETYGLIDE INSULIN  SYRINGE 31G X 15/64 0.5 ML MISC [id number] Yes  [provider]  Active   carboxymethylcellulose (REFRESH PLUS) 0.5 % SOLN [id number] Yes Place 1 drop into both eyes 3 (three) times daily as needed. [provider]  Active Self, Pharmacy Records           Med Note GAYLENE DARVIN JINNY [place] Mar 08, 2024 11:52 PM)    Cholecalciferol  (VITAMIN D3) 25 MCG (1000 UT) CAPS [id number] Yes Take 1 capsule by mouth daily. [provider]  Active   Clobetasol  Propionate (IMPOYZ ) 0.025 % CREA [id number]  Patient states applys 1 x per day.  Patient not taking: Reported on 03/12/2024   Sharma Rockie, MD  Active   Cyanocobalamin  (VITAMIN B-12 PO) [id number] Yes Take by mouth. [provider]  Active Self, Pharmacy Records  docusate sodium  (COLACE) 100 MG capsule [id number]  Take 100 mg by mouth daily as needed for mild constipation.  Patient not taking: Reported on 03/12/2024   [provider]  Active Self, Pharmacy Records  estradiol  (ESTRACE ) 0.1 MG/GM vaginal cream [id number] Yes Place 1 Applicatorful vaginally 2 (two) times a week. Simmons-Robinson, Makiera, MD  Active   Ferrous Sulfate  (IRON ) 325 (65 Fe) MG TABS [id number] Yes Take 1 tablet by mouth every morning. [provider]  Active   fesoterodine  (TOVIAZ ) 8 MG TB24 tablet [id number] Yes Take 1 tablet (8 mg total) by mouth daily. Simmons-Robinson, Napier Field,  MD  Active   gabapentin  (NEURONTIN ) 100 MG capsule [id number]  Take 2 capsules (200 mg total) by mouth 3 (three) times daily.  Patient not taking: Reported on 03/12/2024   Sharma Coyer, MD  Active Self, Pharmacy Records  gabapentin  (NEURONTIN ) 300 MG capsule [id number] Yes Take 300 mg by mouth 3 (three) times daily. [provider]  Active   Glucagon  (BAQSIMI   TWO PACK) 3 MG/DOSE POWD [id number] Yes Place 3 mg into the nose as needed (For hypoglycemia). Caleen Qualia, MD  Active Self, Pharmacy Records           Med Note [phone], ARLEY HERO   Sun Oct 16, 2023  3:47 PM) PRN  insulin  aspart (NOVOLOG ) 100 UNIT/ML injection [id number] Yes Inject 4 Units into the skin 2 (two) times daily before lunch and supper. Ernest Ronal BRAVO, MD  Active   insulin  aspart (NOVOLOG ) 100 UNIT/ML injection [id number] Yes Inject 12 Units into the skin daily before breakfast. Simmons-Robinson, Makiera, MD  Active   Insulin  Disposable Pump (OMNIPOD 5 DEXG7G6 PODS GEN 5) MISC [id number] Yes SMARTSIG:SUB-Q Every 3 Days [provider]  Active   insulin  glargine-yfgn (SEMGLEE ) 100 UNIT/ML injection [id number] Yes Inject 0.18 mLs (18 Units total) into the skin in the morning. Simmons-Robinson, Makiera, MD  Active   Insulin  Pen Needle (DROPLET PEN NEEDLES) 31G X 5 MM MISC [id number] Yes USE 4 TIMES DAILY AS DIRECTED BY YOUR DOCTOR Shamleffer, Donell Cardinal, MD  Active Self, Pharmacy Records  LANTUS  SOLOSTAR 100 UNIT/ML Solostar Pen [id number] Yes Inject into the skin. [provider]  Active   Microlet Lancets MISC [id number] Yes 3 (three) times daily. [provider]  Active Self, Pharmacy Records  Multiple Vitamins-Minerals (CENTRUM SILVER ADULT 50+) TABS [id number] Yes Take 1 tablet by mouth daily. [provider]  Active Self, Pharmacy Records  Omega 3-6-9 CAPS [id number] Yes Take 1 capsule by mouth daily. [provider]  Active   omega-3 acid ethyl esters (LOVAZA ) 1 g capsule [id number] Yes Take by mouth. [provider]  Active   sertraline  (ZOLOFT ) 100 MG tablet [id number] Yes TAKE 1 TABLET EVERY DAY Simmons-Robinson, Makiera, MD  Active Self, Pharmacy Records  SODIUM FLUORIDE  5000 PPM 1.1 % GEL dental gel [id number] Yes SMARTSIG:1 sparingly By Mouth Every Night [provider]  Active   solifenacin  (VESICARE ) 10 MG tablet [id number] Yes  Take 10 mg by mouth daily. [provider]  Active   Tavaborole 5 % SOLN [id number] Yes Apply topically. [provider]  Active Self, Pharmacy Records  tiZANidine  (ZANAFLEX ) 2 MG tablet [id number] Yes TAKE 1 TO 2 TABLETS BY MOUTH AT BEDTIME AS NEEDED FOR MUSCLE SPASMS Simmons-Robinson, Makiera, MD  Active Self, Pharmacy Records  Med List Note Grover Burnard RAMAN, CPhT 03/09/24 1708):  Health [phone]            Home Care and Equipment/Supplies: Were Home Health Services Ordered?: NA Any new equipment or medical supplies ordered?: NA  Functional Questionnaire: Do you need assistance with bathing/showering or dressing?: Yes Do you need assistance with meal preparation?: Yes Do you need assistance with eating?: No Do you have difficulty maintaining continence: No Do you need assistance with getting out of bed/getting out of a chair/moving?: No Do you have difficulty managing or taking your medications?: Yes  Follow up appointments reviewed: PCP Follow-up appointment confirmed?: Yes Date of PCP follow-up appointment?: 03/19/24 Follow-up Provider: [facility] Follow-up appointment confirmed?: NA Do you need transportation to your follow-up appointment?: No  Do you understand care options if your condition(s) worsen?: Yes-patient verbalized understanding    SIGNATURE Julian Lemmings, LPN [facility] Nurse Health Advisor Direct Dial [phone]  "

## 2024-03-19 ENCOUNTER — Encounter: Payer: Self-pay | Admitting: Family Medicine

## 2024-03-19 ENCOUNTER — Ambulatory Visit: Admitting: Family Medicine

## 2024-03-19 ENCOUNTER — Telehealth: Payer: Self-pay

## 2024-03-19 VITALS — BP 129/53

## 2024-03-19 DIAGNOSIS — Z09 Encounter for follow-up examination after completed treatment for conditions other than malignant neoplasm: Secondary | ICD-10-CM

## 2024-03-19 DIAGNOSIS — E10649 Type 1 diabetes mellitus with hypoglycemia without coma: Secondary | ICD-10-CM

## 2024-03-19 DIAGNOSIS — E162 Hypoglycemia, unspecified: Secondary | ICD-10-CM

## 2024-03-19 NOTE — Progress Notes (Signed)
 "  Established Patient Office Visit  Patient ID: Joyce Brown, female    DOB: 1951/01/18  Age: 74 y.o. MRN: [id number] PCP: Sharma Coyer, MD  Chief Complaint  Patient presents with   Hospitalization Follow-up    Patient admitted to  on 03/08/24 to 03/10/24 for hypoglycemia. Pt advised to stop losartan  25 mg     Subjective:     HPI  Discussed the use of AI scribe software for clinical note transcription with the patient, who gave verbal consent to proceed.  History of Present Illness Joyce Brown is a 74 year old female with diabetes who presents with recent hospitalization for hypoglycemia.  She was hospitalized from December 25th to December 27th due to hypoglycemia, with blood sugar levels dropping to 30 mg/dL. She does not experience symptoms of hypoglycemia unless she checks her phone. During her hospital stay, losartan  25 mg was discontinued due to elevated potassium levels, which were managed with Lokelma , reducing potassium from 5.8 mmol/L to 5.0 mmol/L.  She was discharged without receiving a written meal plan from the diabetes section, which recommended reducing rice and carbohydrates. Since discharge, she has been attempting to monitor her diet but faces challenges with the facility's meal offerings, which include high-carb and sugary foods like breaded meats and sugared cereals. Her weight increased from 141 lbs in August to 186 lbs currently. The facility did not receive the order for a carb-modified diet, and she continues to receive inappropriate meals.  Her current medications include insulin  via an Omnipod, with glargine at 18 units per day and Novolog  at 12 units or 4 units twice a day as needed. She also takes alendronate  weekly, but there is confusion at the facility regarding its administration. It should be taken first thing in the morning, 30 minutes before other medications or food, and she must remain upright for 30 minutes after taking  it.  She notes swelling in her legs. She has experienced low blood pressure in the past, leading to the discontinuation of losartan . No confusion or dizziness during hypoglycemic episodes. Admitted 12/25/5 and discharged 03/10/24 for hypoglycemia in setting of type 1 DM (omnipod), HLD, HTN, hyperkalemia, MDD  Transitions of Care Call completed 03/12/24  Discharge summary was reviewed with the following updates:  She presented to the ED with acute   Her losartan  25mg  daily as discontinued   She was advised to continue fosamax  70mg  weekly, ASA 81mg  daily, Lipitor  80mg  daily, Baqsimi  3mg  intranasally for hypoglycemia, estrace  vaginal cream, Toviaz  8mg  daily, gabapentin  200mg  three times daily, gabapentin  300mg  TID, novolog  4 units BID for lunch and dinner, novolog  12 units daily before breakfast, glargine 18 units every AM, ferrous sulfate  325mg  daily, zoloft  100mg  daily   Contacted Dr. Carlin for updated endo follow up, pt has been scheduled for 03/28/24 at 1:30P     ROS    Objective:     BP (!) 129/53 (BP Location: Left Arm, Patient Position: Sitting, Cuff Size: Large)   SpO2 98%  BP Readings from Last 3 Encounters:  03/19/24 (!) 129/53  03/10/24 122/79  12/14/23 (!) 109/57   Wt Readings from Last 3 Encounters:  03/08/24 186 lb 4.6 oz (84.5 kg)  12/14/23 150 lb 14.4 oz (68.4 kg)  11/01/23 160 lb 15 oz (73 kg)      Physical Exam   No results found for any visits on 03/19/24.  Last metabolic panel Lab Results  Component Value Date   GLUCOSE 166 (H) 03/10/2024   NA 139  03/10/2024   K 5.0 03/10/2024   CL 105 03/10/2024   CO2 26 03/10/2024   BUN 20 03/10/2024   CREATININE 0.87 03/10/2024   GFRNONAA >60 03/10/2024   CALCIUM  8.7 (L) 03/10/2024   PHOS 2.6 10/20/2023   PROT 6.4 (L) 03/08/2024   ALBUMIN 3.7 03/08/2024   LABGLOB 2.0 06/09/2022   AGRATIO 2.1 06/09/2022   BILITOT <0.2 03/08/2024   ALKPHOS 102 03/08/2024   AST 45 (H) 03/08/2024   ALT 48 (H) 03/08/2024    ANIONGAP 8 03/10/2024   Last lipids Lab Results  Component Value Date   CHOL 139 09/09/2022   HDL 46.60 09/09/2022   LDLCALC 72 09/09/2022   TRIG 104.0 09/09/2022   CHOLHDL 3 09/09/2022   Last hemoglobin A1c Lab Results  Component Value Date   HGBA1C 7.4 (H) 03/08/2024   Last thyroid  functions Lab Results  Component Value Date   TSH 1.37 06/26/2021   FREET4 0.77 06/19/2020   Last vitamin D  Lab Results  Component Value Date   VD25OH 41.64 10/17/2023   Last vitamin B12 and Folate Lab Results  Component Value Date   VITAMINB12 3,163 (H) 10/17/2023   FOLATE 12.5 10/17/2023      The 10-year ASCVD risk score (Arnett DK, et al., 2019) is: 18.5%    Assessment & Plan:   Problem List Items Addressed This Visit     Hypoglycemia - Primary   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 No symptoms of confusion or dizziness reported. Concerns about blood sugar fluctuations and potential for coma if hypoglycemia persists. - Provided written orders for dietary changes to help with blood sugar management at home. - Continue current insulin  regimen with glargine and Novolog  as needed.  Type 1 diabetes mellitus Chronic  Blood sugar management is challenging due to dietary inconsistencies and medication issues. Recent weight gain noted, possibly due to dietary intake. Concerns about kidney protection in the context of diabetes. - Provided written orders for dietary changes to help with blood sugar management at home. - Continue current insulin  regimen with glargine and Novolog  as needed. - Will coordinate with endocrinology for further management and recommendations.  Obesity Chronic  Recent  weight gain of 30 pounds since moving to the facility. Dietary intake includes high-carbohydrate foods and sugary cereals, contributing to weight gain. - Provided written orders for dietary changes to help with blood sugar management at home. - Limit intake of breads, cereals, sugary beverages, and high-carbohydrate foods.  Edema Acute  Noted in legs, possibly related to dietary sodium intake and recent weight gain. Pitting edema observed. - Provided written orders for dietary changes to help with blood sugar management at home. - Monitor for changes in edema and adjust dietary sodium intake as needed.  Hypertension Chronic  Blood pressure currently low at 129/53 mmHg. Losartan  discontinued due to hyperkalemia. No current antihypertensive medication needed due to low blood pressure. - Continue to monitor blood pressure and adjust treatment as needed.  Hyperkalemia Recent hyperkalemia with potassium level at 5.8 mmol/L, managed with Lokelma . Losartan  discontinued to prevent further elevation of potassium levels. - Continue to monitor potassium levels and adjust treatment as needed.  Osteoporosis Confusion regarding  alendronate  administration schedule. Requires clarification on timing and administration to ensure efficacy. - Provided written instructions for alendronate  administration: take first thing in the morning, 30 minutes before eating or other medications, and remain upright for at least 30 minutes.  General Health Maintenance Routine health maintenance discussed, including the need for calcium  supplementation due to low calcium  levels. - Initiated calcium  supplementation.    Return in about 9 weeks (around 05/21/2024) for Chronic F/U.    Rockie Agent, MD [facility]   "

## 2024-03-19 NOTE — Telephone Encounter (Signed)
 Per Dr.Robinson to call Dr.Woody's office Endocrinologist  to see if they can see patient sooner for hospital follow up. They rescheduled patient for 03/28/2024 at 1:30 pm. Called patient to inform NA, LMTCB. I ave also sent her a my chart message.

## 2024-03-19 NOTE — Patient Instructions (Addendum)
 Your endocrinology appointment has been updated to 03/28/24 at 1:30 PM    Please allow for Joyce Brown to consume low carb diet in order to prevent extremes in her blood sugar.   - Please avoid breads/breaded meats/ fried foods, sugary beverages, sugary cereals, desserts (opting for no sugar options) and limited potatoes (limit to 2 servings per week)  Her Fosamax  needs to be given first thing in the morning and >=30 minutes before the first food, beverage (except plain water), or other medication(s) of the day.   Patients should be instructed to stay upright (not to lie down) for >=30 minutes and until after first food of the day (to reduce esophageal irritation).

## 2024-03-20 ENCOUNTER — Ambulatory Visit

## 2024-03-20 VITALS — BP 138/70 | Ht 70.0 in | Wt 181.7 lb

## 2024-03-20 DIAGNOSIS — Z1231 Encounter for screening mammogram for malignant neoplasm of breast: Secondary | ICD-10-CM | POA: Diagnosis not present

## 2024-03-20 DIAGNOSIS — Z0000 Encounter for general adult medical examination without abnormal findings: Secondary | ICD-10-CM | POA: Diagnosis not present

## 2024-03-20 NOTE — Patient Instructions (Addendum)
 [name],  Thank you for taking the time for your Medicare Wellness Visit. I appreciate your continued commitment to your health goals. Please review the care plan we discussed, and feel free to reach out if I can assist you further.  Please note that Annual Wellness Visits do not include a physical exam. Some assessments may be limited, especially if the visit was conducted virtually. If needed, we may recommend an in-person follow-up with your provider.  Ongoing Care Seeing your primary care provider every 3 to 6 months helps us  monitor your health and provide consistent, personalized care.   Referrals If a referral was made during today's visit and you haven't received any updates within two weeks, please contact the referred provider directly to check on the status.   REFERRAL SENT FOR MAMMOGRAM FOR JUNE 2026 You have an order for:  []   2D Mammogram  [x]   3D Mammogram  []   Bone Density     Please call for appointment:  [facility]  [address] [place] [address] [phone] [facility] [address] [place], [place] [address] [phone] [facility] [address]. [place], [place] [address] [phone]   Make sure to wear two-piece clothing.  No lotions, powders, or deodorants the day of the appointment. Make sure to bring picture ID and insurance card.  Bring list of medications you are currently taking including any supplements.   Schedule your [place] screening mammogram through MyChart!   Log into your MyChart account.  Go to 'Visit' (or 'Appointments' if on mobile App) --> Schedule an Appointment  Under 'Select a Reason for Visit' choose the Mammogram Screening option.  Complete the pre-visit questions and select the time and place that best fits your schedule.   Recommended Screenings:  Health Maintenance  Topic Date  Due   Complete foot exam   05/26/2024   COVID-19 Vaccine (11 - 2025-26 season) 07/26/2024   Hemoglobin A1C  09/06/2024   Breast Cancer Screening  09/07/2024   Eye exam for diabetics  01/08/2025   DTaP/Tdap/Td vaccine (6 - Td or Tdap) 07/24/2029   Colon Cancer Screening  03/23/2033   Pneumococcal Vaccine for age over 80  Completed   Flu Shot  Completed   Osteoporosis screening with Bone Density Scan  Completed   Zoster (Shingles) Vaccine  Completed   Meningitis B Vaccine  Aged Out   Hepatitis B Vaccine  Discontinued       03/20/2024    2:28 PM  Advanced Directives  Does Patient Have a Medical Advance Directive? Yes  Type of Estate Agent of [place];Living will  Does patient want to make changes to medical advance directive? No - Patient declined  Copy of Healthcare Power of Attorney in Chart? Yes - validated most recent copy scanned in chart (See row information)    Vision: Annual vision screenings are recommended for early detection of glaucoma, cataracts, and diabetic retinopathy. These exams can also reveal signs of chronic conditions such as diabetes and high blood pressure.  Dental: Annual dental screenings help detect early signs of oral cancer, gum disease, and other conditions linked to overall health, including heart disease and diabetes.  Please see the attached documents for additional preventive care recommendations.   NEXT AWV 03/26/25 @ 1:50 PM IN PERSON

## 2024-03-20 NOTE — Progress Notes (Signed)
 "  No chief complaint on file.    Subjective:   Joyce Brown is a 74 y.o. female who presents for a Medicare Annual Wellness Visit.  Visit info / Clinical Intake: Medicare Wellness Visit Type:: Subsequent Annual Wellness Visit Persons participating in visit and providing information:: patient Medicare Wellness Visit Mode:: In-person (required for WTM) Interpreter Needed?: No Pre-visit prep was completed: yes AWV questionnaire completed by patient prior to visit?: no Living arrangements:: other (ASSISTED LIVING) Patient's Overall Health Status Rating: (!) fair Typical amount of pain: some Does pain affect daily life?: no Are you currently prescribed opioids?: no  Dietary Habits and Nutritional Risks How many meals a day?: 3 Eats fruit and vegetables daily?: (!) no Most meals are obtained by: having others provide food In the last 2 weeks, have you had any of the following?: none Diabetic:: (!) yes Any non-healing wounds?: no How often do you check your BS?: continuous glucose monitor Would you like to be referred to a Nutritionist or for Diabetic Management? : no  Functional Status Activities of Daily Living (to include ambulation/medication): (!) Needs Assist Feeding: Independent Dressing/Grooming: Independent Bathing: Needs assistance Toileting: Independent Transfer: Independent Ambulation: Needs Assistance (W/C) Home Assistive Devices/Equipment: Wheelchair Medication Administration: Needs assistance (comment) Is this a change from baseline?: Pre-admission baseline Home Management (perform basic housework or laundry): Needs assistance (comment) Manage your own finances?: yes Primary transportation is: facility / other ([employer] TRANS[employer]) Concerns about vision?: no *vision screening is required for WTM* (GLASSES ALL DAY- DR.DINGELDEIN) Concerns about hearing?: (!) yes Uses hearing aids?: (!) yes (BOTH EARS) Hear whispered voice?: yes  Fall Screening Falls in the  past year?: 1 Number of falls in past year: 1 Was there an injury with Fall?: 1 Fall Risk Category Calculator: 3 Patient Fall Risk Level: High Fall Risk  Fall Risk Patient at Risk for Falls Due to: Impaired balance/gait; Impaired mobility Fall risk Follow up: Falls evaluation completed; Falls prevention discussed  Home and Transportation Safety: All rugs have non-skid backing?: N/A, no rugs All stairs or steps have railings?: N/A, no stairs Grab bars in the bathtub or shower?: yes Have non-skid surface in bathtub or shower?: (!) no Good home lighting?: yes Regular seat belt use?: yes Hospital stays in the last year:: (!) yes How many hospital stays:: 2 Reason: DKA & PNEUMONIA; LOW BS  Cognitive Assessment Difficulty concentrating, remembering, or making decisions? : yes Will 6CIT or Mini Cog be Completed: yes What year is it?: 0 points What month is it?: 0 points Give patient an address phrase to remember (5 components): [address] [place], [place] About what time is it?: 0 points Count backwards from 20 to 1: 0 points Say the months of the year in reverse: 0 points Repeat the address phrase from earlier: 0 points 6 CIT Score: 0 points  Advance Directives (For Healthcare) Does Patient Have a Medical Advance Directive?: Yes Does patient want to make changes to medical advance directive?: No - Patient declined Type of Advance Directive: Healthcare Power of [place]; Living will Copy of Healthcare Power of Attorney in Chart?: Yes - validated most recent copy scanned in chart (See row information) Copy of Living Will in Chart?: Yes - validated most recent copy scanned in chart (See row information) Out of facility DNR (pink MOST or yellow form) in Chart? (Ambulatory ONLY): Yes - validated most recent copy scanned in chart Pre-existing out of facility DNR order (yellow form or pink MOST form): Pink Most/Yellow Form available -  Physician notified to receive inpatient order Would  patient like information on creating a medical advance directive?: No - Patient declined  Reviewed/Updated  Reviewed/Updated: Reviewed All (Medical, Surgical, Family, Medications, Allergies, Care Teams, Patient Goals)    Allergies (verified) Patient has no known allergies.   Current Medications (verified) Outpatient Encounter Medications as of 03/20/2024  Medication Sig   Acetaminophen  500 MG capsule Take 500 mg by mouth every 6 (six) hours as needed for moderate pain.   alendronate  (FOSAMAX ) 70 MG tablet Take 1 tablet (70 mg total) by mouth every 7 (seven) days. Take with a full glass of water on an empty stomach.   antiseptic oral rinse (BIOTENE) LIQD 15 mLs by Mouth Rinse route in the morning, at noon, in the evening, and at bedtime.   aspirin  EC 81 MG tablet Take 1 tablet (81 mg total) by mouth daily. Swallow whole.   atorvastatin  (LIPITOR ) 80 MG tablet TAKE 1 TABLET (80 MG TOTAL) BY MOUTH DAILY.   BD SAFETYGLIDE INSULIN  SYRINGE 31G X 15/64 0.5 ML MISC    carboxymethylcellulose (REFRESH PLUS) 0.5 % SOLN Place 1 drop into both eyes 3 (three) times daily as needed.   Cholecalciferol  (VITAMIN D3) 25 MCG (1000 UT) CAPS Take 1 capsule by mouth daily.   Cyanocobalamin  (VITAMIN B-12 PO) Take by mouth.   estradiol  (ESTRACE ) 0.1 MG/GM vaginal cream Place 1 Applicatorful vaginally 2 (two) times a week.   Ferrous Sulfate  (IRON ) 325 (65 Fe) MG TABS Take 1 tablet by mouth every morning.   fesoterodine  (TOVIAZ ) 8 MG TB24 tablet Take 1 tablet (8 mg total) by mouth daily.   gabapentin  (NEURONTIN ) 300 MG capsule Take 300 mg by mouth 3 (three) times daily.   Glucagon  (BAQSIMI  TWO PACK) 3 MG/DOSE POWD Place 3 mg into the nose as needed (For hypoglycemia).   insulin  aspart (NOVOLOG ) 100 UNIT/ML injection Inject 4 Units into the skin 2 (two) times daily before lunch and supper.   insulin  aspart (NOVOLOG ) 100 UNIT/ML injection Inject 12 Units into the skin daily before breakfast.   Insulin  Disposable Pump  (OMNIPOD 5 DEXG7G6 PODS GEN 5) MISC SMARTSIG:SUB-Q Every 3 Days   insulin  glargine-yfgn (SEMGLEE ) 100 UNIT/ML injection Inject 0.18 mLs (18 Units total) into the skin in the morning.   Insulin  Pen Needle (DROPLET PEN NEEDLES) 31G X 5 MM MISC USE 4 TIMES DAILY AS DIRECTED BY YOUR DOCTOR   LANTUS  SOLOSTAR 100 UNIT/ML Solostar Pen Inject into the skin.   Microlet Lancets MISC 3 (three) times daily.   Multiple Vitamins-Minerals (CENTRUM SILVER ADULT 50+) TABS Take 1 tablet by mouth daily.   Omega 3-6-9 CAPS Take 1 capsule by mouth daily.   omega-3 acid ethyl esters (LOVAZA ) 1 g capsule Take by mouth.   sertraline  (ZOLOFT ) 100 MG tablet TAKE 1 TABLET EVERY DAY   SODIUM FLUORIDE  5000 PPM 1.1 % GEL dental gel SMARTSIG:1 sparingly By Mouth Every Night   solifenacin  (VESICARE ) 10 MG tablet Take 10 mg by mouth daily.   Tavaborole 5 % SOLN Apply topically.   tiZANidine  (ZANAFLEX ) 2 MG tablet TAKE 1 TO 2 TABLETS BY MOUTH AT BEDTIME AS NEEDED FOR MUSCLE SPASMS   Clobetasol  Propionate (IMPOYZ ) 0.025 % CREA Patient states applys 1 x per day. (Patient not taking: Reported on 03/12/2024)   docusate sodium  (COLACE) 100 MG capsule Take 100 mg by mouth daily as needed for mild constipation. (Patient not taking: Reported on 03/12/2024)   gabapentin  (NEURONTIN ) 100 MG capsule Take 2 capsules (200 mg total)  by mouth 3 (three) times daily. (Patient not taking: Reported on 03/20/2024)   No facility-administered encounter medications on file as of 03/20/2024.    History: Past Medical History:  Diagnosis Date   Arthritis 1995   Have family history of it.   Asthma 1995   Has resolved   Cataract 2017   Family History of them   Cerebellar ataxia (HCC)    Depression 1996   Due to Fibromyalgia.   Diabetes mellitus without complication (HCC)    Diabetic neuropathy (HCC) 12/09/2020   FH: ovarian cancer in first degree relative 11/16/2018   Foot fracture 2022   Gait abnormality 11/07/2018   GERD (gastroesophageal  reflux disease) 1985   No longer a problem since Gastric Bypass Surgery 09/25/2012.   Glaucoma Various   Family History of it.   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   Past Surgical History:  Procedure Laterality Date   ABDOMINAL HYSTERECTOMY     ABDOMINAL HYSTERECTOMY  1997   Partial Hysterectomy   ADENOIDECTOMY     ANKLE SURGERY Left    BILATERAL CARPAL TUNNEL RELEASE     BREAST BIOPSY Left 2019   benign   COLON SURGERY  09/25/2012   Gastric Bypass Surgery   COLONOSCOPY WITH PROPOFOL  N/A 03/24/2023   Procedure: COLONOSCOPY WITH PROPOFOL ;  Surgeon: Unk Corinn Skiff, MD;  Location: [facility] ENDOSCOPY;  Service: Gastroenterology;  Laterality: N/A;   ELBOW SURGERY     EYE SURGERY  03/14/22 & 03/17/22   Cataract Surgery   GASTRIC BYPASS  2014   HAND NEUROPLASTY     NASAL SINUS SURGERY     POLYPECTOMY  03/24/2023   Procedure: POLYPECTOMY;  Surgeon: Unk Corinn Skiff, MD;  Location: [facility] ENDOSCOPY;  Service: Gastroenterology;;   radio denervation  05/21/2020   SMALL INTESTINE SURGERY  08/18/2013   Laparoscopic Procedure to Relieve Small Bowel Obstruction   TONSILLECTOMY     Family History  Problem Relation Age of Onset   Cervical cancer Mother    Ovarian cancer Mother    Arthritis Mother    Cancer Mother    Dementia Father    Retinal detachment Father    Heart attack Father    Diabetes Father    Stroke Father    Hypertension Father    Alcohol  abuse Father    Arthritis Father    Hearing loss Father    Hyperlipidemia Father    Ovarian cancer Sister    Arthritis Sister    Arthritis Sister    Arthritis Sister     Arthritis Sister    Glaucoma Paternal Grandfather    Arthritis Paternal Grandfather    Depression Paternal Grandfather    Hearing loss Paternal Grandfather    Diabetes Paternal Grandmother    Arthritis Sister    Arthritis Sister    Cancer Sister    Arthritis Sister    Diabetes Maternal Uncle    Vision loss Maternal Uncle    Arthritis Maternal Grandmother    Arthritis Sister    Diabetes Maternal Uncle    Vision loss Maternal Uncle    Social  History   Occupational History   Occupation: Retired  Tobacco Use   Smoking status: Never    Passive exposure: Never   Smokeless tobacco: Never  Vaping Use   Vaping status: Never Used  Substance and Sexual Activity   Alcohol  use: Never   Drug use: Never   Sexual activity: Not Currently   Tobacco Counseling Counseling given: Not Answered  SDOH Screenings   Food Insecurity: No Food Insecurity (03/20/2024)  Housing: Unknown (03/20/2024)  Recent Concern: Housing - High Risk (03/16/2024)  Transportation Needs: No Transportation Needs (03/20/2024)  Utilities: Not At Risk (03/20/2024)  Alcohol  Screen: Low Risk (01/11/2023)  Depression (PHQ2-9): Low Risk (03/20/2024)  Financial Resource Strain: Low Risk (03/16/2024)  Physical Activity: Insufficiently Active (03/20/2024)  Social Connections: Moderately Isolated (03/20/2024)  Stress: No Stress Concern Present (03/20/2024)  Tobacco Use: Low Risk (03/20/2024)  Health Literacy: Adequate Health Literacy (03/20/2024)   See flowsheets for full screening details  Depression Screen PHQ 2 & 9 Depression Scale- Over the past 2 weeks, how often have you been bothered by any of the following problems? Little interest or pleasure in doing things: 0 Feeling down, depressed, or hopeless (PHQ Adolescent also includes...irritable): 0 PHQ-2 Total Score: 0 Trouble falling or staying asleep, or sleeping too much: 0 Feeling tired or having little energy: 0 Poor appetite or overeating (PHQ Adolescent also includes...weight  loss): 0 Feeling bad about yourself - or that you are a failure or have let yourself or your family down: 0 Trouble concentrating on things, such as reading the newspaper or watching television (PHQ Adolescent also includes...like school work): 0 Moving or speaking so slowly that other people could have noticed. Or the opposite - being so fidgety or restless that you have been moving around a lot more than usual: 0 Thoughts that you would be better off dead, or of hurting yourself in some way: 0 PHQ-9 Total Score: 0 If you checked off any problems, how difficult have these problems made it for you to do your work, take care of things at home, or get along with other people?: Not difficult at all     Goals Addressed             This Visit's Progress    DIET - INCREASE WATER INTAKE               Objective:    Today's Vitals   03/20/24 1424  BP: 138/70  Weight: 181 lb 11.2 oz (82.4 kg)  Height: 5' 10 (1.778 m)   Body mass index is 26.07 kg/m.  Hearing/Vision screen Hearing Screening - Comments:: AIDS BOTH EARS Vision Screening - Comments:: GLASSES- DR.DINGELDEIN Immunizations and Health Maintenance Health Maintenance  Topic Date Due   Influenza Vaccine  06/12/2024 (Originally 10/14/2023)   FOOT EXAM  05/26/2024   COVID-19 Vaccine (11 - 2025-26 season) 07/26/2024   HEMOGLOBIN A1C  09/06/2024   OPHTHALMOLOGY EXAM  01/08/2025   Mammogram  09/07/2025   DTaP/Tdap/Td (6 - Td or Tdap) 07/24/2029   Colonoscopy  03/23/2033   Pneumococcal Vaccine: 50+ Years  Completed   Bone Density Scan  Completed   Zoster Vaccines- Shingrix  Completed   Meningococcal B Vaccine  Aged Out   Hepatitis B Vaccines 19-59 Average Risk  Discontinued        Assessment/Plan:  This is a routine wellness examination for [employer].  Patient Care Team: Sharma Coyer, MD as PCP - General (Family Medicine) Marcelino Nurse, MD as Consulting Physician (Pain Medicine) Standley,  Dorothyann LABOR, MD  (Endocrinology) Lenn Standing, MD (Ophthalmology) Dena Delon Marseille, MD (Emergency Medicine) Ninette Lauraine BROCKS, MD as Referring Physician (Audiology) Lane Arthea BRAVO, MD as Referring Physician (Neurology) Mcarthur Mins, DMD (Dentistry) Gail Sharlet KIDD, RD as Dietitian (Dietician)  I have personally reviewed and noted the following in the patients chart:   Medical and social history Use of alcohol , tobacco or illicit drugs  Current medications and supplements including opioid prescriptions. Functional ability and status Nutritional status Physical activity Advanced directives List of other physicians Hospitalizations, surgeries, and ER visits in previous 12 months Vitals Screenings to include cognitive, depression, and falls Referrals and appointments  No orders of the defined types were placed in this encounter.  In addition, I have reviewed and discussed with patient certain preventive protocols, quality metrics, and best practice recommendations. A written personalized care plan for preventive services as well as general preventive health recommendations were provided to patient.   Jhonnie GORMAN Das, LPN   10/16/7971   Return in 1 year (on 03/20/2025).  After Visit Summary: (In Person-Declined) Patient declined AVS at this time.  Nurse Notes: UTD ON SHOTS; UTD ON COLONOSCOPY, MAMMOGRAM (REFERRAL SENT) & BDS    "

## 2024-03-23 ENCOUNTER — Emergency Department
Admission: EM | Admit: 2024-03-23 | Discharge: 2024-03-23 | Disposition: A | Discharge status: Skilled Nursing Facility | Attending: Emergency Medicine | Admitting: Emergency Medicine

## 2024-03-23 ENCOUNTER — Other Ambulatory Visit: Payer: Self-pay

## 2024-03-23 DIAGNOSIS — E162 Hypoglycemia, unspecified: Secondary | ICD-10-CM | POA: Diagnosis present

## 2024-03-23 DIAGNOSIS — I10 Essential (primary) hypertension: Secondary | ICD-10-CM | POA: Insufficient documentation

## 2024-03-23 DIAGNOSIS — E10649 Type 1 diabetes mellitus with hypoglycemia without coma: Secondary | ICD-10-CM | POA: Diagnosis not present

## 2024-03-23 DIAGNOSIS — E875 Hyperkalemia: Secondary | ICD-10-CM | POA: Insufficient documentation

## 2024-03-23 DIAGNOSIS — E109 Type 1 diabetes mellitus without complications: Secondary | ICD-10-CM

## 2024-03-23 LAB — COMPREHENSIVE METABOLIC PANEL WITH GFR
ALT: 38 U/L (ref 0–44)
AST: 38 U/L (ref 15–41)
Albumin: 4 g/dL (ref 3.5–5.0)
Alkaline Phosphatase: 108 U/L (ref 38–126)
Anion gap: 7 (ref 5–15)
BUN: 17 mg/dL (ref 8–23)
CO2: 27 mmol/L (ref 22–32)
Calcium: 9.6 mg/dL (ref 8.9–10.3)
Chloride: 99 mmol/L (ref 98–111)
Creatinine, Ser: 0.94 mg/dL (ref 0.44–1.00)
GFR, Estimated: >60 mL/min
Glucose, Bld: 330 mg/dL — ABNORMAL HIGH (ref 70–99)
Potassium: 6.4 mmol/L (ref 3.5–5.1)
Sodium: 133 mmol/L — ABNORMAL LOW (ref 135–145)
Total Bilirubin: 0.4 mg/dL (ref 0.0–1.2)
Total Protein: 7 g/dL (ref 6.5–8.1)

## 2024-03-23 LAB — CBC WITH DIFFERENTIAL/PLATELET
Abs Immature Granulocytes: 0.04 K/uL (ref 0.00–0.07)
Basophils Absolute: 0.1 K/uL (ref 0.0–0.1)
Basophils Relative: 1 %
Eosinophils Absolute: 0.1 K/uL (ref 0.0–0.5)
Eosinophils Relative: 1 %
HCT: 40.1 % (ref 36.0–46.0)
Hemoglobin: 12.8 g/dL (ref 12.0–15.0)
Immature Granulocytes: 0 %
Lymphocytes Relative: 8 %
Lymphs Abs: 0.9 K/uL (ref 0.7–4.0)
MCH: 30.5 pg (ref 26.0–34.0)
MCHC: 31.9 g/dL (ref 30.0–36.0)
MCV: 95.5 fL (ref 80.0–100.0)
Monocytes Absolute: 0.7 K/uL (ref 0.1–1.0)
Monocytes Relative: 7 %
Neutro Abs: 8.6 K/uL — ABNORMAL HIGH (ref 1.7–7.7)
Neutrophils Relative %: 83 %
Platelets: 246 K/uL (ref 150–400)
RBC: 4.2 MIL/uL (ref 3.87–5.11)
RDW: 14.4 % (ref 11.5–15.5)
WBC: 10.4 K/uL (ref 4.0–10.5)
nRBC: 0 % (ref 0.0–0.2)

## 2024-03-23 LAB — CBG MONITORING, ED
Glucose-Capillary: 135 mg/dL — ABNORMAL HIGH (ref 70–99)
Glucose-Capillary: 156 mg/dL — ABNORMAL HIGH (ref 70–99)

## 2024-03-23 LAB — POTASSIUM: Potassium: 5.2 mmol/L — ABNORMAL HIGH (ref 3.5–5.1)

## 2024-03-23 MED ORDER — SODIUM ZIRCONIUM CYCLOSILICATE 10 G PO PACK
10.0000 g | PACK | Freq: Once | ORAL | Status: AC
  Administered 2024-03-23: 10 g via ORAL
  Filled 2024-03-23: qty 1

## 2024-03-23 NOTE — ED Provider Notes (Signed)
 "  [facility] Provider Note    Event Date/Time   First MD Initiated Contact with Patient 03/23/24 1549     (approximate)   History   Hypoglycemia   HPI  Joyce Brown is a 74 y.o. female with type 2 diabetes with OmniPod, GERD, hypertension, OSA who comes in with concerns for hypoglycemia.  I reviewed the notes where patient was just admitted for hypoglycemia on 12/25 until 12/27.  She followed up with her primary care doctor on 03/19/2024.  Patient reports that she was at [facility] when she was slumped over in a wheelchair initial glucose was 28 and upon repeat was 203 after oral glucose, D10.  The insulin  pump gives automatic insulin  boluses around mealtime whether patient eats or not   Patient reports that she had a very sugary breakfast but then she did not have any lunch since she was at [employer].  She reports that her insulin  is on auto inject and that she got too much insulin .  She denies falling and hitting her head or any other concerns she reports feeling at her baseline self.  She just ate a large turkey sandwich and now her sugars are in the 300s and she continues to deny any symptoms.    Physical Exam   Triage Vital Signs: ED Triage Vitals  Encounter Vitals Group     BP 03/23/24 1344 (!) 144/77     Girls Systolic BP Percentile --      Girls Diastolic BP Percentile --      Boys Systolic BP Percentile --      Boys Diastolic BP Percentile --      Pulse Rate 03/23/24 1344 61     Resp 03/23/24 1344 16     Temp --      Temp src --      SpO2 03/23/24 1344 99 %     Weight --      Height --      Head Circumference --      Peak Flow --      Pain Score 03/23/24 1349 0     Pain Loc --      Pain Education --      Exclude from Growth Chart --     Most recent vital signs: Vitals:   03/23/24 1344  BP: (!) 144/77  Pulse: 61  Resp: 16  SpO2: 99%     General: Awake, no distress.  CV:  Good peripheral perfusion.  Resp:  Normal effort.   Abd:  No distention.  Soft and nontender Other:  No trauma to the head noted.  Speaking in full sentences moving all extremities.   ED Results / Procedures / Treatments   Labs (all labs ordered are listed, but only abnormal results are displayed) Labs Reviewed  CBG MONITORING, ED - Abnormal; Notable for the following components:      Result Value   Glucose-Capillary 156 (*)    All other components within normal limits  CBG MONITORING, ED - Abnormal; Notable for the following components:   Glucose-Capillary 135 (*)    All other components within normal limits  COMPREHENSIVE METABOLIC PANEL WITH GFR  CBC WITH DIFFERENTIAL/PLATELET     EKG  My interpretation of EKG:  Normal sinus rate of 64 without any ST elevation or T wave inversions, normal intervals.  RADIOLOGY I have reviewed the xray personally and interpreted    PROCEDURES:  Critical Care performed: No  Procedures   MEDICATIONS ORDERED  IN ED: Medications - No data to display   IMPRESSION / MDM / ASSESSMENT AND PLAN / ED COURSE  I reviewed the triage vital signs and the nursing notes.   Patient's presentation is most consistent with acute presentation with potential threat to life or bodily function.  Patient comes in with episode of low blood sugar.  This led to a syncopal episode.  Patient did not fall out of the chair she denies any head trauma she reports feeling at her baseline self.  She was just admitted for similar with low blood sugars therefore I will discuss with the diabetes coordinator to see if they can help educate patient and give any tips to help prevent this from happening again.  Patient's repeat glucose was 135 after interventions with EMS  Patient was given a turkey sandwich.  Her CBC was reassuring and her glucose was 330 but her potassium level was 6.4.  This blood work was down in the lab for about 2 hours prior to it going in process so I wonder if some of this could be hemolysis.  I  would be hesitant to fully reverse this given she has got no AKI and the insulin  itself will help shift some back into the cells.  Before treating it I would like to get a repeat potassium level.  I will get an EKG as well to make sure no evidence of any arrhythmia, peaked t waves etc.   Repeat potassium level without any interventions was 5.2.  Discussed this with patient and she does report that she has not been taking the losartan .  She denies any potassium supplementation.  She denies taking any significant banana intake.  She understands the importance of having this rechecked with her primary care doctor on Monday or Tuesday for repeat evaluation.  We discussed that hyperkalemia if it goes up really high can be life-threatening but at this time it seems to be more around her baseline.  But out of precaution I will give a dose of Lokelma  and have her follow-up for a repeat test on Monday.  Patient expressed understanding and felt comfortable with this plan  6:14 PM The diabetes coordinator also talked with patient and gave her some instructions on things to do to help improve risk of hypoglycemia.  Patient's sugar at this point is 270 and she feels comfortable with discharge home.  She has been monitored for over 4 hours without any recurrent hypoglycemia   FINAL CLINICAL IMPRESSION(S) / ED DIAGNOSES   Final diagnoses:  Hypoglycemia  Hyperkalemia  Type 1 diabetes mellitus without complications (HCC)     Rx / DC Orders   ED Discharge Orders     None        Note:  This document was prepared using Dragon voice recognition software and may include unintentional dictation errors.   Ernest Ronal BRAVO, MD 03/23/24 1816  "

## 2024-03-23 NOTE — ED Notes (Signed)
 This RN spoke to [place], CHARITY FUNDRAISER from [facility] and gave her report on pt and pending discharge back to facility.

## 2024-03-23 NOTE — ED Notes (Signed)
 Pt switched insulin  pump from automated to manual via app on her phone.

## 2024-03-23 NOTE — ED Notes (Signed)
 [facility] called speak with Alm regarding Transfer to [facility]

## 2024-03-23 NOTE — Discharge Instructions (Signed)
 The diabetes coordinator discussed with you about different things you can do to help prevent hypoglycemia in the future.  We also did notice that your potassium levels were elevated.  When we rechecked it the level was much closer to your normal baseline and I have given you a dose of potassium lower medications out of precaution.  However I do recommend that you follow-up for recheck potassium on Monday to ensure that it is not climbing back up.    Return to the ER if you develop any confusion, lightheadedness or any other concerns

## 2024-03-23 NOTE — ED Triage Notes (Signed)
 Pt BIB [facility] from [facility] where she was slumped over in wheelchair with Dexcom alarming low blood sugar. Bystander called EMS. Initial CBG 28, improved to 203 after oral glucose and D10.   Hx T1D, wears insulin  pump and dexcom, insulin  pump reportedly gives automated insulin  boluses around mealtime whether patient eats or not.

## 2024-03-23 NOTE — Inpatient Diabetes Management (Signed)
 Inpatient Diabetes Program Recommendations  AACE/ADA: New Consensus Statement on Inpatient Glycemic Control (2015)  Target Ranges:  Prepandial:   less than 140 mg/dL      Peak postprandial:   less than 180 mg/dL (1-2 hours)      Critically ill patients:  140 - 180 mg/dL   Lab Results  Component Value Date   GLUCAP 135 (H) 03/23/2024   HGBA1C 7.4 (H) 03/08/2024    Review of Glycemic Control  Latest Reference Range & Units 03/23/24 13:46 03/23/24 14:15  Glucose-Capillary 70 - 99 mg/dL 843 (H) 864 (H)   Diabetes history: Type 1 DM Outpatient Diabetes medications: Pump settings: Omnipod 5 in auto mode Basal 0.75 ICR 10 ISF 60  (increased by Dr. Carlin in the past 2 weeks) Target 120  Correct above 130 AIT 4 h  Inpatient Diabetes Program Recommendations:        Spoke to patient regarding hypoglycemia today at [facility].  She wears an Omnipod insulin  pump.  She states that she had [place] cheerios this morning and covered the CHO and corrected the blood sugar after eating.  Patient was at [facility] when her blood sugar dropped rapidly to 26 mg/dL.  We discussed cause and patient verbalized understanding.  She needs to f/u with endocrinology.  I encouraged patient to keep pump in auto mode so that pump can auto-adjust based on CGM values. Discussed with MD, and she was okay with us  changing patient's setting to activity mode which increased the goal blood sugar from 130 to 150 mg/dL (for the next 24 hours).  Patient states that this would make her feel much better for her goal to be 150 mg/dL. Discussed with MD.  Patient appreciative of information and states she feels better.  I encouraged her to keep snacks with her and prevent low blood sugars. Needs close f/u with endcrinology.   Thanks,  Randall Bullocks, RN, BC-ADM Inpatient Diabetes Coordinator Pager [phone]

## 2024-03-23 NOTE — ED Notes (Signed)
 First nurse note: Pt to ED via [facility] for hypoglycemia. Pt lives at [facility] and signed herself out and took [employer] to [place] with her wheelchair. Pt was at [facility] [address]) and was seen slumped over and her Dexcom was beeping. CBG was 29 and pt was diaphoretic on EMS arrival. EMS gave oral glucose and IV D10, about and CBG came up to 203. Pt's wheelchair is still at [employer].

## 2024-03-23 NOTE — ED Notes (Signed)
Provided [place] sandwich tray.

## 2024-03-27 ENCOUNTER — Ambulatory Visit: Admitting: Family Medicine

## 2024-03-27 ENCOUNTER — Encounter: Payer: Self-pay | Admitting: Family Medicine

## 2024-03-27 VITALS — BP 127/75 | HR 65 | Temp 98.1°F | Ht 70.0 in | Wt 174.4 lb

## 2024-03-27 DIAGNOSIS — N3941 Urge incontinence: Secondary | ICD-10-CM

## 2024-03-27 DIAGNOSIS — I10 Essential (primary) hypertension: Secondary | ICD-10-CM

## 2024-03-27 DIAGNOSIS — E875 Hyperkalemia: Secondary | ICD-10-CM

## 2024-03-27 DIAGNOSIS — G119 Hereditary ataxia, unspecified: Secondary | ICD-10-CM

## 2024-03-27 DIAGNOSIS — E785 Hyperlipidemia, unspecified: Secondary | ICD-10-CM | POA: Diagnosis not present

## 2024-03-27 DIAGNOSIS — Z0000 Encounter for general adult medical examination without abnormal findings: Secondary | ICD-10-CM

## 2024-03-27 DIAGNOSIS — E1142 Type 2 diabetes mellitus with diabetic polyneuropathy: Secondary | ICD-10-CM

## 2024-03-27 DIAGNOSIS — E10649 Type 1 diabetes mellitus with hypoglycemia without coma: Secondary | ICD-10-CM | POA: Diagnosis not present

## 2024-03-27 DIAGNOSIS — Z993 Dependence on wheelchair: Secondary | ICD-10-CM | POA: Diagnosis not present

## 2024-03-27 DIAGNOSIS — E213 Hyperparathyroidism, unspecified: Secondary | ICD-10-CM

## 2024-03-27 NOTE — Progress Notes (Addendum)
 "  Established Patient Visit   Patient: Joyce Veach HutchinsFemale    DOB: 1951/01/24 74 y.o.   MRN: [id number]  Chief Complaint  Patient presents with   Annual Exam    Last completed 03/20/24 ([facility]) Diet -  Normal, would like to get in more veggies and fruits  Did visit ED for hypoglycemia on 1/9     Subjective:    Joyce Brown is a 74 y.o. female who presents today for a complete physical exam.    Discussed the use of AI scribe software for clinical note transcription with the patient, who gave verbal consent to proceed.  History of Present Illness Joyce Brown is a 74 year old female with diabetes who presents for a wellness visit and hospital follow up  She has experienced episodes of hypoglycemia, requiring emergency department visits twice recently, with the most recent on March 23, 2024. During these episodes, her glucose levels dropped significantly, necessitating treatment with glucose to stabilize her levels. She felt unwell during a trip to [place], prompting the recent ED visit.  She has been monitoring her diet by scraping breading off fish. She uses a Dexcom sensor for glucose monitoring, which she places on her back to avoid interference with her wheelchair. She has been informed that she could also use her thighs for sensor placement.  Her potassium levels have been elevated during recent ED visits, with a high of 6.4, but have since decreased to 5.2. She is concerned about the relationship between her potassium levels and blood sugar readings.  Her current medications include vitamin D  1000 IU, and she is unsure if she is taking vitamin B6. She also takes B12, D3, and Fosamax .  No chest pain or difficulty breathing reported.    Most recent fall risk assessment:    03/27/2024    3:04 PM  Fall Risk   Falls in the past year? 1  Number falls in past yr: 1  Injury with Fall? 1  Risk for fall due to : History of fall(s);Impaired balance/gait;Impaired  mobility  Follow up Falls evaluation completed     Most recent depression screenings:    03/27/2024    3:05 PM 03/20/2024    2:42 PM  PHQ 2/9 Scores  PHQ - 2 Score 0 0  PHQ- 9 Score 2 0        Patient Care Team: Sharma Coyer, MD as PCP - General (Family Medicine) Marcelino Nurse, MD as Consulting Physician (Pain Medicine) Standley Dorothyann LABOR, MD (Endocrinology) Lenn Standing, MD (Ophthalmology) Dena Delon Marseille, MD (Emergency Medicine) Ninette Lauraine BROCKS, MD as Referring Physician (Audiology) Lane Arthea BRAVO, MD as Referring Physician (Neurology) Mcarthur Mins, DMD (Dentistry) Gail Sharlet KIDD, RD as Dietitian (Dietician)   ROS    Objective:    BP 127/75 (BP Location: Right Arm, Patient Position: Sitting, Cuff Size: Normal)   Pulse 65   Temp 98.1 F (36.7 C) (Oral)   Ht 5' 10 (1.778 m)   Wt 174 lb 6.4 oz (79.1 kg)   SpO2 98%   BMI 25.02 kg/m    Physical Exam Vitals reviewed.  Constitutional:      General: She is not in acute distress.    Appearance: Normal appearance. She is not ill-appearing, toxic-appearing or diaphoretic.     Comments: Seated in wheelchair   HENT:     Head: Normocephalic and atraumatic.     Right Ear: Tympanic membrane and external ear normal. There is no impacted cerumen.  Left Ear: Tympanic membrane and external ear normal. There is no impacted cerumen.     Nose: Nose normal.     Mouth/Throat:     Pharynx: Oropharynx is clear.  Eyes:     General: No scleral icterus.    Extraocular Movements: Extraocular movements intact.     Conjunctiva/sclera: Conjunctivae normal.     Pupils: Pupils are equal, round, and reactive to light.  Cardiovascular:     Rate and Rhythm: Normal rate and regular rhythm.     Pulses: Normal pulses.     Heart sounds: Normal heart sounds. No murmur heard.    No friction rub. No gallop.  Pulmonary:     Effort: Pulmonary effort is normal. No respiratory distress.     Breath sounds:  Normal breath sounds. No wheezing, rhonchi or rales.  Abdominal:     General: Bowel sounds are normal. There is no distension.     Palpations: Abdomen is soft. There is no mass.     Tenderness: There is no abdominal tenderness. There is no guarding.  Musculoskeletal:        General: No deformity.     Cervical back: Normal range of motion and neck supple.     Right lower leg: No edema.     Left lower leg: No edema.  Lymphadenopathy:     Cervical: No cervical adenopathy.  Skin:    General: Skin is warm.     Capillary Refill: Capillary refill takes less than 2 seconds.     Findings: No erythema or rash.  Neurological:     General: No focal deficit present.     Mental Status: She is alert and oriented to person, place, and time.     Cranial Nerves: Cranial nerves 2-12 are intact. No cranial nerve deficit or facial asymmetry.     Motor: Motor function is intact. No weakness.     Gait: Gait normal.  Psychiatric:        Mood and Affect: Mood normal.        Behavior: Behavior normal.       Results for orders placed or performed in visit on 03/27/24  CMP14+EGFR  Result Value Ref Range   Glucose 253 (H) 70 - 99 mg/dL   BUN 18 8 - 27 mg/dL   Creatinine, Ser 9.04 0.57 - 1.00 mg/dL   eGFR 64 >40 fO/fpw/8.26   BUN/Creatinine Ratio 19 12 - 28   Sodium 135 134 - 144 mmol/L   Potassium 5.5 (H) 3.5 - 5.2 mmol/L   Chloride 97 96 - 106 mmol/L   CO2 23 20 - 29 mmol/L   Calcium  9.1 8.7 - 10.3 mg/dL   Total Protein 6.2 6.0 - 8.5 g/dL   Albumin 3.8 3.8 - 4.8 g/dL   Globulin, Total 2.4 1.5 - 4.5 g/dL   Bilirubin Total 0.3 0.0 - 1.2 mg/dL   Alkaline Phosphatase 109 49 - 135 IU/L   AST 27 0 - 40 IU/L   ALT 33 (H) 0 - 32 IU/L        Assessment & Plan:    Routine Health Maintenance and Physical Exam Immunization History  Administered Date(s) Administered    sv, Bivalent, Protein Subunit Rsvpref,pf [place]) 12/14/2021, 12/16/2022   DTaP 10/26/2016   Fluad Quad(high Dose 65+)  11/20/2019, 12/16/2020, 12/14/2021   Fluad Trivalent(High Dose 65+) 11/10/2022   Hepatitis A, Ped/Adol-2 Dose 04/13/2022, 10/11/2022   Hepatitis B, PED/ADOLESCENT 04/13/2022, 05/13/2022, 10/11/2022   INFLUENZA, HIGH DOSE SEASONAL PF 01/06/2017  Influenza Split 01/28/2024   Influenza, Seasonal, Injecte, Preservative Fre 12/15/2007, 12/16/2008, 12/15/2009, 12/18/2010, 12/20/2011, 01/04/2013   Influenza,inj,Quad PF,6+ Mos 12/28/2013, 01/02/2015, 01/01/2016, 12/06/2017, 11/15/2018   Influenza,trivalent, recombinat, inj, PF 01/06/2017   Influenza-Unspecified 12/15/2007, 12/16/2008, 12/15/2009, 12/18/2010, 12/20/2011, 01/04/2013, 12/14/2021   Moderna Covid-19 Fall Seasonal Vaccine 17yrs & older 05/26/2022, 01/27/2024   PFIZER Comirnaty (Gray Top)Covid-19 Tri-Sucrose Vaccine 07/01/2020   PFIZER(Purple Top)SARS-COV-2 Vaccination 04/29/2019, 05/22/2019, 01/23/2020   PNEUMOCOCCAL CONJUGATE-20 12/31/2021   Pfizer(Comirnaty )Fall Seasonal Vaccine 12 years and older 01/01/2022, 12/16/2022, 06/20/2023   Pneumococcal Conjugate-13 03/20/2018   Pneumococcal Polysaccharide-23 02/12/2005, 07/22/2016   Respiratory Syncytial Virus Vaccine ,Recomb Aduvanted(Arexvy ) 12/16/2022   Td 02/17/2007   Td (Adult),5 Lf Tetanus Toxid, Preservative Free 02/17/2007   Tdap 11/23/2013, 07/25/2019   Unspecified SARS-COV-2 Vaccination 12/24/2020, 01/01/2022   Zoster Recombinant(Shingrix) 07/21/2017, 11/04/2017    Health Maintenance  Topic Date Due   FOOT EXAM  05/26/2024   COVID-19 Vaccine (11 - 2025-26 season) 07/26/2024   HEMOGLOBIN A1C  09/06/2024   Mammogram  09/07/2024   OPHTHALMOLOGY EXAM  01/08/2025   DTaP/Tdap/Td (6 - Td or Tdap) 07/24/2029   Colonoscopy  03/23/2033   Pneumococcal Vaccine: 50+ Years  Completed   Influenza Vaccine  Completed   Bone Density Scan  Completed   Zoster Vaccines- Shingrix  Completed   Meningococcal B Vaccine  Aged Out   Hepatitis B Vaccines 19-59 Average Risk  Discontinued     Discussed health benefits of physical activity, and encouraged her to engage in regular exercise appropriate for her age and condition.  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 Mammogram due in June, eye exam in October, tetanus up to date until 2031, colonoscopy due in 2035, foot exam in March, COVID vaccine in November, and flu shot confirmed. - Continue routine wellness screenings and vaccinations as scheduled.  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 Glucose levels have stabilized since adjusting to automatic mode with activity factor. No recent admissions required. Coordination with endocrinologist ongoing. - Continue current diabetes management plan with automatic mode and activity factor. - Follow up with endocrinologist for further management.  Hyperkalemia Recent hyperkalemia with potassium levels over 6.4, now improved to 5.2. Potential link to hypoglycemia and insulin  use discussed. No immediate need for dialysis. - Checked potassium levels again. - Continue to monitor glucose levels closely.    No follow-ups on file.    Rockie Agent, MD [facility]   "

## 2024-03-28 ENCOUNTER — Ambulatory Visit: Payer: Self-pay | Admitting: Family Medicine

## 2024-03-28 DIAGNOSIS — E875 Hyperkalemia: Secondary | ICD-10-CM

## 2024-03-28 LAB — CMP14+EGFR
ALT: 33 IU/L — ABNORMAL HIGH (ref 0–32)
AST: 27 IU/L (ref 0–40)
Albumin: 3.8 g/dL (ref 3.8–4.8)
Alkaline Phosphatase: 109 IU/L (ref 49–135)
BUN/Creatinine Ratio: 19 (ref 12–28)
BUN: 18 mg/dL (ref 8–27)
Bilirubin Total: 0.3 mg/dL (ref 0.0–1.2)
CO2: 23 mmol/L (ref 20–29)
Calcium: 9.1 mg/dL (ref 8.7–10.3)
Chloride: 97 mmol/L (ref 96–106)
Creatinine, Ser: 0.95 mg/dL (ref 0.57–1.00)
Globulin, Total: 2.4 g/dL (ref 1.5–4.5)
Glucose: 253 mg/dL — ABNORMAL HIGH (ref 70–99)
Potassium: 5.5 mmol/L — ABNORMAL HIGH (ref 3.5–5.2)
Sodium: 135 mmol/L (ref 134–144)
Total Protein: 6.2 g/dL (ref 6.0–8.5)
eGFR: 64 mL/min/1.73

## 2024-03-30 MED ORDER — VELTASSA 8.4 G PO PACK: 8.4000 g | PACK | Freq: Every day | ORAL | 0 refills | Status: AC

## 2024-03-30 NOTE — Telephone Encounter (Unsigned)
 Copied from CRM [id number]. Topic: General - Other >> Mar 30, 2024  3:11 PM Joesph B wrote: Reason for CRM: patients pharmacy will not have patiromer  (VELTASSA ) 8.4 g packet [[id number]] until the 19th and she wanted to know if that will be okay

## 2024-03-30 NOTE — Telephone Encounter (Signed)
 Copied from CRM [id number]. Topic: Clinical - Lab/Test Results >> Mar 30, 2024  2:56 PM Delon DASEN wrote: Reason for CRM: read results verbatim, no questions, scheduling follow up

## 2024-04-11 ENCOUNTER — Inpatient Hospital Stay: Admitting: Family Medicine

## 2024-04-11 NOTE — Addendum Note (Signed)
 Addended by: SIMMONS-ROBINSON, Kester Stimpson L on: 04/11/2024 06:44 AM   Modules accepted: Level of Service

## 2024-04-12 ENCOUNTER — Encounter: Payer: Self-pay | Admitting: Family Medicine

## 2024-04-16 ENCOUNTER — Other Ambulatory Visit: Payer: Self-pay | Admitting: Family Medicine

## 2024-04-16 ENCOUNTER — Ambulatory Visit: Admitting: Family Medicine

## 2024-04-16 DIAGNOSIS — E875 Hyperkalemia: Secondary | ICD-10-CM

## 2024-04-18 ENCOUNTER — Encounter: Payer: Self-pay | Admitting: Family Medicine

## 2024-04-18 ENCOUNTER — Other Ambulatory Visit (HOSPITAL_COMMUNITY): Payer: Self-pay

## 2024-04-19 ENCOUNTER — Other Ambulatory Visit (HOSPITAL_COMMUNITY): Payer: Self-pay

## 2024-04-19 NOTE — Telephone Encounter (Signed)
 Plan will pay for a 30 day transition supply now. 30 day supply on new insurance is $5.10 at [facility].   PA needed for future fills. PA request has been submitted.   Waiting on clinical questions to populate.

## 2024-04-20 LAB — BMP8+EGFR
BUN/Creatinine Ratio: 17 (ref 12–28)
BUN: 16 mg/dL (ref 8–27)
CO2: 25 mmol/L (ref 20–29)
Calcium: 9.5 mg/dL (ref 8.7–10.3)
Chloride: 102 mmol/L (ref 96–106)
Creatinine, Ser: 0.95 mg/dL (ref 0.57–1.00)
Glucose: 156 mg/dL — ABNORMAL HIGH (ref 70–99)
Potassium: 5.4 mmol/L — ABNORMAL HIGH (ref 3.5–5.2)
Sodium: 137 mmol/L (ref 134–144)
eGFR: 64 mL/min/{1.73_m2}

## 2024-05-22 ENCOUNTER — Ambulatory Visit: Admitting: Family Medicine

## 2024-06-01 ENCOUNTER — Ambulatory Visit: Admitting: Podiatry

## 2025-03-26 ENCOUNTER — Ambulatory Visit
